# Patient Record
Sex: Female | Born: 1940
Health system: Southern US, Community
[De-identification: ages and names within clinical notes are randomized; demographics above are authoritative.]

## PROBLEM LIST (undated history)

## (undated) DIAGNOSIS — E785 Hyperlipidemia, unspecified: Secondary | ICD-10-CM

## (undated) DIAGNOSIS — I071 Rheumatic tricuspid insufficiency: Secondary | ICD-10-CM

## (undated) DIAGNOSIS — N183 Chronic kidney disease, stage 3 (moderate): Secondary | ICD-10-CM

## (undated) DIAGNOSIS — J449 Chronic obstructive pulmonary disease, unspecified: Secondary | ICD-10-CM

## (undated) DIAGNOSIS — I10 Essential (primary) hypertension: Secondary | ICD-10-CM

## (undated) DIAGNOSIS — I2510 Atherosclerotic heart disease of native coronary artery without angina pectoris: Secondary | ICD-10-CM

## (undated) DIAGNOSIS — I4891 Unspecified atrial fibrillation: Secondary | ICD-10-CM

## (undated) DIAGNOSIS — J189 Pneumonia, unspecified organism: Secondary | ICD-10-CM

## (undated) DIAGNOSIS — E119 Type 2 diabetes mellitus without complications: Secondary | ICD-10-CM

## (undated) DIAGNOSIS — M1990 Unspecified osteoarthritis, unspecified site: Secondary | ICD-10-CM

## (undated) DIAGNOSIS — K56609 Unspecified intestinal obstruction, unspecified as to partial versus complete obstruction: Secondary | ICD-10-CM

## (undated) DIAGNOSIS — I5030 Unspecified diastolic (congestive) heart failure: Secondary | ICD-10-CM

## (undated) DIAGNOSIS — E559 Vitamin D deficiency, unspecified: Secondary | ICD-10-CM

## (undated) HISTORY — DX: Unspecified atrial fibrillation: I48.91

## (undated) HISTORY — DX: Rheumatic tricuspid insufficiency: I07.1

## (undated) HISTORY — DX: Vitamin D deficiency, unspecified: E55.9

## (undated) HISTORY — DX: Hyperlipidemia, unspecified: E78.5

## (undated) HISTORY — PX: TUBAL LIGATION: SHX77

## (undated) HISTORY — DX: Chronic kidney disease, stage 3 (moderate): N18.3

## (undated) HISTORY — DX: Atherosclerotic heart disease of native coronary artery without angina pectoris: I25.10

## (undated) HISTORY — DX: Essential (primary) hypertension: I10

## (undated) HISTORY — PX: HEART CHAMBER REVISION: SHX267

## (undated) HISTORY — PX: APPENDECTOMY: SHX54

## (undated) HISTORY — DX: Unspecified diastolic (congestive) heart failure: I50.30

---

## 1999-12-19 ENCOUNTER — Emergency Department (HOSPITAL_COMMUNITY): Admission: EM | Admit: 1999-12-19 | Discharge: 1999-12-19 | Payer: Self-pay | Admitting: Emergency Medicine

## 1999-12-20 ENCOUNTER — Encounter: Payer: Self-pay | Admitting: Emergency Medicine

## 2000-12-19 ENCOUNTER — Emergency Department (HOSPITAL_COMMUNITY): Admission: EM | Admit: 2000-12-19 | Discharge: 2000-12-19 | Payer: Self-pay | Admitting: Emergency Medicine

## 2000-12-19 ENCOUNTER — Encounter: Payer: Self-pay | Admitting: Emergency Medicine

## 2004-11-13 ENCOUNTER — Emergency Department (HOSPITAL_COMMUNITY): Admission: EM | Admit: 2004-11-13 | Discharge: 2004-11-13 | Payer: Self-pay | Admitting: *Deleted

## 2006-03-22 ENCOUNTER — Ambulatory Visit (HOSPITAL_COMMUNITY): Admission: RE | Admit: 2006-03-22 | Discharge: 2006-03-22 | Payer: Self-pay | Admitting: Cardiology

## 2006-07-10 LAB — HM PAP SMEAR: HM Pap smear: NORMAL

## 2008-07-15 LAB — HM DEXA SCAN

## 2008-08-13 LAB — HM COLONOSCOPY: HM Colonoscopy: NORMAL

## 2008-12-25 ENCOUNTER — Inpatient Hospital Stay (HOSPITAL_COMMUNITY): Admission: EM | Admit: 2008-12-25 | Discharge: 2008-12-26 | Payer: Self-pay | Admitting: Emergency Medicine

## 2009-07-16 LAB — HM MAMMOGRAPHY: HM Mammogram: NORMAL

## 2010-02-13 ENCOUNTER — Encounter: Payer: Self-pay | Admitting: Family Medicine

## 2010-04-26 LAB — CULTURE, BLOOD (ROUTINE X 2): Culture: NO GROWTH

## 2010-04-26 LAB — CBC
HCT: 32.2 % — ABNORMAL LOW (ref 36.0–46.0)
HCT: 34.3 % — ABNORMAL LOW (ref 36.0–46.0)
Hemoglobin: 11.2 g/dL — ABNORMAL LOW (ref 12.0–15.0)
Hemoglobin: 11.8 g/dL — ABNORMAL LOW (ref 12.0–15.0)
MCHC: 34.5 g/dL (ref 30.0–36.0)
MCV: 89.4 fL (ref 78.0–100.0)
Platelets: 139 10*3/uL — ABNORMAL LOW (ref 150–400)
RBC: 3.63 MIL/uL — ABNORMAL LOW (ref 3.87–5.11)
RBC: 3.83 MIL/uL — ABNORMAL LOW (ref 3.87–5.11)
RDW: 14.3 % (ref 11.5–15.5)
RDW: 14.6 % (ref 11.5–15.5)
WBC: 6.8 10*3/uL (ref 4.0–10.5)
WBC: 8.3 10*3/uL (ref 4.0–10.5)

## 2010-04-26 LAB — BASIC METABOLIC PANEL
Calcium: 8.6 mg/dL (ref 8.4–10.5)
GFR calc Af Amer: >60 mL/min (ref >60)
GFR calc non Af Amer: >60 mL/min (ref >60)
Glucose, Bld: 175 mg/dL — ABNORMAL HIGH (ref 70–99)
Potassium: 3.5 mEq/L (ref 3.5–5.1)
Sodium: 138 mEq/L (ref 135–145)

## 2010-04-26 LAB — GLUCOSE, CAPILLARY
Glucose-Capillary: 104 mg/dL — ABNORMAL HIGH (ref 70–99)
Glucose-Capillary: 140 mg/dL — ABNORMAL HIGH (ref 70–99)
Glucose-Capillary: 149 mg/dL — ABNORMAL HIGH (ref 70–99)

## 2010-04-26 LAB — DIFFERENTIAL
Basophils Absolute: 0 10*3/uL (ref 0.0–0.1)
Basophils Relative: 0 % (ref 0–1)
Eosinophils Absolute: 0 10*3/uL (ref 0.0–0.7)
Eosinophils Relative: 1 % (ref 0–5)
Eosinophils Relative: 2 % (ref 0–5)
Lymphocytes Relative: 17 % (ref 12–46)
Lymphs Abs: 1.2 10*3/uL (ref 0.7–4.0)
Lymphs Abs: 1.3 10*3/uL (ref 0.7–4.0)
Monocytes Absolute: 0.7 10*3/uL (ref 0.1–1.0)
Monocytes Relative: 11 % (ref 3–12)
Monocytes Relative: 9 % (ref 3–12)
Neutro Abs: 4.7 10*3/uL (ref 1.7–7.7)

## 2010-04-26 LAB — URINE MICROSCOPIC-ADD ON

## 2010-04-26 LAB — POCT I-STAT, CHEM 8
Calcium, Ion: 1.11 mmol/L — ABNORMAL LOW (ref 1.12–1.32)
Creatinine, Ser: 0.7 mg/dL (ref 0.4–1.2)
Glucose, Bld: 107 mg/dL — ABNORMAL HIGH (ref 70–99)
Hemoglobin: 11.9 g/dL — ABNORMAL LOW (ref 12.0–15.0)
TCO2: 22 mmol/L (ref 0–100)

## 2010-04-26 LAB — URINALYSIS, ROUTINE W REFLEX MICROSCOPIC
Bilirubin Urine: NEGATIVE
Ketones, ur: NEGATIVE mg/dL
Protein, ur: NEGATIVE mg/dL
Urobilinogen, UA: 0.2 mg/dL (ref 0.0–1.0)

## 2010-04-26 LAB — URINE CULTURE: Colony Count: NO GROWTH

## 2010-04-26 LAB — LIPID PANEL: LDL Cholesterol: 48 mg/dL (ref 0–99)

## 2010-05-11 ENCOUNTER — Encounter: Payer: Self-pay | Admitting: Physician Assistant

## 2010-07-26 ENCOUNTER — Encounter: Payer: Self-pay | Admitting: Family Medicine

## 2011-02-02 ENCOUNTER — Emergency Department (HOSPITAL_COMMUNITY): Payer: Medicare HMO

## 2011-02-02 ENCOUNTER — Inpatient Hospital Stay (HOSPITAL_COMMUNITY)
Admission: EM | Admit: 2011-02-02 | Discharge: 2011-02-06 | DRG: 292 | Disposition: A | Discharge status: Home / Self Care | Payer: Medicare HMO | Attending: Internal Medicine | Admitting: Internal Medicine

## 2011-02-02 ENCOUNTER — Encounter (HOSPITAL_COMMUNITY): Payer: Self-pay | Admitting: Emergency Medicine

## 2011-02-02 DIAGNOSIS — I272 Pulmonary hypertension, unspecified: Secondary | ICD-10-CM | POA: Diagnosis present

## 2011-02-02 DIAGNOSIS — J441 Chronic obstructive pulmonary disease with (acute) exacerbation: Secondary | ICD-10-CM | POA: Diagnosis present

## 2011-02-02 DIAGNOSIS — I160 Hypertensive urgency: Secondary | ICD-10-CM | POA: Diagnosis present

## 2011-02-02 DIAGNOSIS — R739 Hyperglycemia, unspecified: Secondary | ICD-10-CM

## 2011-02-02 DIAGNOSIS — E876 Hypokalemia: Secondary | ICD-10-CM

## 2011-02-02 DIAGNOSIS — I5031 Acute diastolic (congestive) heart failure: Principal | ICD-10-CM

## 2011-02-02 DIAGNOSIS — D649 Anemia, unspecified: Secondary | ICD-10-CM | POA: Diagnosis present

## 2011-02-02 DIAGNOSIS — IMO0002 Reserved for concepts with insufficient information to code with codable children: Secondary | ICD-10-CM

## 2011-02-02 DIAGNOSIS — J449 Chronic obstructive pulmonary disease, unspecified: Secondary | ICD-10-CM | POA: Diagnosis present

## 2011-02-02 DIAGNOSIS — I509 Heart failure, unspecified: Secondary | ICD-10-CM

## 2011-02-02 DIAGNOSIS — I10 Essential (primary) hypertension: Secondary | ICD-10-CM

## 2011-02-02 DIAGNOSIS — E1165 Type 2 diabetes mellitus with hyperglycemia: Secondary | ICD-10-CM

## 2011-02-02 DIAGNOSIS — J4489 Other specified chronic obstructive pulmonary disease: Secondary | ICD-10-CM | POA: Diagnosis present

## 2011-02-02 DIAGNOSIS — E1121 Type 2 diabetes mellitus with diabetic nephropathy: Secondary | ICD-10-CM | POA: Diagnosis present

## 2011-02-02 DIAGNOSIS — E785 Hyperlipidemia, unspecified: Secondary | ICD-10-CM

## 2011-02-02 DIAGNOSIS — J189 Pneumonia, unspecified organism: Secondary | ICD-10-CM

## 2011-02-02 DIAGNOSIS — I2789 Other specified pulmonary heart diseases: Secondary | ICD-10-CM | POA: Diagnosis present

## 2011-02-02 DIAGNOSIS — IMO0001 Reserved for inherently not codable concepts without codable children: Secondary | ICD-10-CM | POA: Diagnosis present

## 2011-02-02 LAB — BASIC METABOLIC PANEL
CO2: 30 mEq/L (ref 19–32)
Calcium: 9.5 mg/dL (ref 8.4–10.5)
Creatinine, Ser: 0.82 mg/dL (ref 0.50–1.10)
Glucose, Bld: 413 mg/dL — ABNORMAL HIGH (ref 70–99)

## 2011-02-02 LAB — CULTURE, BLOOD (ROUTINE X 2)

## 2011-02-02 LAB — RETICULOCYTES
RBC.: 3.64 MIL/uL — ABNORMAL LOW (ref 3.87–5.11)
Retic Ct Pct: 2 % (ref 0.4–3.1)

## 2011-02-02 LAB — PRO B NATRIURETIC PEPTIDE: Pro B Natriuretic peptide (BNP): 5352 pg/mL — ABNORMAL HIGH (ref 0–125)

## 2011-02-02 LAB — DIFFERENTIAL
Basophils Absolute: 0.1 10*3/uL (ref 0.0–0.1)
Eosinophils Relative: 3 % (ref 0–5)
Lymphocytes Relative: 22 % (ref 12–46)
Monocytes Absolute: 0.4 10*3/uL (ref 0.1–1.0)

## 2011-02-02 LAB — CBC
HCT: 31.4 % — ABNORMAL LOW (ref 36.0–46.0)
MCH: 28.5 pg (ref 26.0–34.0)
MCHC: 33.4 g/dL (ref 30.0–36.0)
MCV: 85.1 fL (ref 78.0–100.0)
RDW: 13.9 % (ref 11.5–15.5)
WBC: 5.8 10*3/uL (ref 4.0–10.5)

## 2011-02-02 MED ORDER — SIMVASTATIN 20 MG PO TABS
40.0000 mg | ORAL_TABLET | Freq: Every day | ORAL | Status: DC
Start: 2011-02-03 — End: 2011-02-03

## 2011-02-02 MED ORDER — SENNOSIDES-DOCUSATE SODIUM 8.6-50 MG PO TABS
2.0000 | ORAL_TABLET | Freq: Every day | ORAL | Status: DC
  Administered 2011-02-03 – 2011-02-05 (×3): 2 via ORAL
  Filled 2011-02-02 (×3): qty 2

## 2011-02-02 MED ORDER — POTASSIUM CHLORIDE CRYS ER 20 MEQ PO TBCR
40.0000 meq | EXTENDED_RELEASE_TABLET | ORAL | Status: AC
  Administered 2011-02-03: 40 meq via ORAL
  Filled 2011-02-02 (×2): qty 2

## 2011-02-02 MED ORDER — IPRATROPIUM BROMIDE 0.02 % IN SOLN: 0.5000 mg | RESPIRATORY_TRACT | Status: DC | PRN

## 2011-02-02 MED ORDER — ALBUTEROL SULFATE (5 MG/ML) 0.5% IN NEBU
5.0000 mg | INHALATION_SOLUTION | Freq: Once | RESPIRATORY_TRACT | Status: AC
  Administered 2011-02-02: 5 mg via RESPIRATORY_TRACT
  Filled 2011-02-02: qty 1

## 2011-02-02 MED ORDER — ONDANSETRON HCL 4 MG/2ML IJ SOLN: 4.0000 mg | INTRAMUSCULAR | Status: DC | PRN

## 2011-02-02 MED ORDER — ACETAMINOPHEN 325 MG PO TABS: 650.0000 mg | ORAL_TABLET | ORAL | Status: DC | PRN

## 2011-02-02 MED ORDER — INSULIN ASPART 100 UNIT/ML ~~LOC~~ SOLN
0.0000 [IU] | Freq: Three times a day (TID) | SUBCUTANEOUS | Status: DC
  Administered 2011-02-03: 15 [IU] via SUBCUTANEOUS
  Administered 2011-02-03: 5 [IU] via SUBCUTANEOUS
  Administered 2011-02-04: 15 [IU] via SUBCUTANEOUS
  Administered 2011-02-04: 8 [IU] via SUBCUTANEOUS
  Administered 2011-02-05: 11 [IU] via SUBCUTANEOUS
  Administered 2011-02-05 (×2): 8 [IU] via SUBCUTANEOUS
  Administered 2011-02-06: 3 [IU] via SUBCUTANEOUS
  Administered 2011-02-06: 11 [IU] via SUBCUTANEOUS
  Filled 2011-02-02: qty 3

## 2011-02-02 MED ORDER — DEXTROSE 5 % IV SOLN
500.0000 mg | Freq: Once | INTRAVENOUS | Status: AC
  Administered 2011-02-02: 500 mg via INTRAVENOUS
  Filled 2011-02-02: qty 500

## 2011-02-02 MED ORDER — POTASSIUM CHLORIDE CRYS ER 20 MEQ PO TBCR
40.0000 meq | EXTENDED_RELEASE_TABLET | Freq: Once | ORAL | Status: AC
  Administered 2011-02-02: 40 meq via ORAL
  Filled 2011-02-02: qty 2

## 2011-02-02 MED ORDER — INFLUENZA VIRUS VACC SPLIT PF IM SUSP
0.5000 mL | INTRAMUSCULAR | Status: AC
  Filled 2011-02-02: qty 0.5

## 2011-02-02 MED ORDER — DEXTROSE 5 % IV SOLN
1.0000 g | Freq: Once | INTRAVENOUS | Status: AC
  Administered 2011-02-02: 1 g via INTRAVENOUS
  Filled 2011-02-02: qty 10

## 2011-02-02 MED ORDER — POTASSIUM CHLORIDE 10 MEQ/100ML IV SOLN
10.0000 meq | INTRAVENOUS | Status: AC
  Administered 2011-02-02 – 2011-02-03 (×4): 10 meq via INTRAVENOUS
  Filled 2011-02-02 (×4): qty 100

## 2011-02-02 MED ORDER — INSULIN ASPART 100 UNIT/ML ~~LOC~~ SOLN
0.0000 [IU] | Freq: Every day | SUBCUTANEOUS | Status: DC
  Administered 2011-02-03: 2 [IU] via SUBCUTANEOUS
  Administered 2011-02-04 – 2011-02-05 (×2): 4 [IU] via SUBCUTANEOUS

## 2011-02-02 MED ORDER — POTASSIUM CHLORIDE CRYS ER 20 MEQ PO TBCR
40.0000 meq | EXTENDED_RELEASE_TABLET | Freq: Two times a day (BID) | ORAL | Status: DC
  Administered 2011-02-03 – 2011-02-04 (×3): 40 meq via ORAL
  Filled 2011-02-02: qty 1
  Filled 2011-02-02: qty 2
  Filled 2011-02-02: qty 1
  Filled 2011-02-02: qty 2

## 2011-02-02 MED ORDER — FUROSEMIDE 10 MG/ML IJ SOLN: 40.0000 mg | Freq: Once | INTRAMUSCULAR | Status: DC

## 2011-02-02 MED ORDER — NITROGLYCERIN 2 % TD OINT
1.0000 [in_us] | TOPICAL_OINTMENT | Freq: Four times a day (QID) | TRANSDERMAL | Status: DC
  Administered 2011-02-03 – 2011-02-04 (×6): 1 [in_us] via TOPICAL
  Filled 2011-02-02 (×6): qty 1

## 2011-02-02 MED ORDER — HYDRALAZINE HCL 20 MG/ML IJ SOLN: 10.0000 mg | INTRAMUSCULAR | Status: DC | PRN

## 2011-02-02 MED ORDER — BISACODYL 10 MG RE SUPP: 10.0000 mg | Freq: Every day | RECTAL | Status: DC | PRN

## 2011-02-02 MED ORDER — FUROSEMIDE 10 MG/ML IJ SOLN
40.0000 mg | Freq: Two times a day (BID) | INTRAMUSCULAR | Status: DC
  Administered 2011-02-03 (×3): 40 mg via INTRAVENOUS
  Filled 2011-02-02 (×3): qty 4

## 2011-02-02 MED ORDER — FLEET ENEMA 7-19 GM/118ML RE ENEM: 1.0000 | ENEMA | Freq: Every day | RECTAL | Status: DC | PRN

## 2011-02-02 MED ORDER — ALBUTEROL SULFATE (5 MG/ML) 0.5% IN NEBU: 2.5000 mg | INHALATION_SOLUTION | RESPIRATORY_TRACT | Status: DC | PRN

## 2011-02-02 MED ORDER — IOHEXOL 300 MG/ML  SOLN
80.0000 mL | Freq: Once | INTRAMUSCULAR | Status: AC | PRN
  Administered 2011-02-02: 80 mL via INTRAVENOUS

## 2011-02-02 MED ORDER — ZOLPIDEM TARTRATE 5 MG PO TABS: 5.0000 mg | ORAL_TABLET | Freq: Every evening | ORAL | Status: DC | PRN

## 2011-02-02 MED ORDER — GLIPIZIDE ER 10 MG PO TB24
10.0000 mg | ORAL_TABLET | Freq: Every day | ORAL | Status: DC
  Administered 2011-02-03: 10 mg via ORAL
  Filled 2011-02-02: qty 1

## 2011-02-02 MED ORDER — BENAZEPRIL HCL 10 MG PO TABS
40.0000 mg | ORAL_TABLET | Freq: Every day | ORAL | Status: DC
  Administered 2011-02-03 – 2011-02-06 (×4): 40 mg via ORAL
  Filled 2011-02-02 (×4): qty 4

## 2011-02-02 MED ORDER — POTASSIUM CHLORIDE CRYS ER 20 MEQ PO TBCR
40.0000 meq | EXTENDED_RELEASE_TABLET | Freq: Two times a day (BID) | ORAL | Status: DC
  Administered 2011-02-03: 40 meq via ORAL

## 2011-02-02 MED ORDER — ENOXAPARIN SODIUM 40 MG/0.4ML ~~LOC~~ SOLN
40.0000 mg | Freq: Every day | SUBCUTANEOUS | Status: DC
  Administered 2011-02-03 – 2011-02-06 (×5): 40 mg via SUBCUTANEOUS
  Filled 2011-02-02 (×5): qty 0.4

## 2011-02-02 MED ORDER — ENOXAPARIN SODIUM 40 MG/0.4ML ~~LOC~~ SOLN: 40.0000 mg | SUBCUTANEOUS | Status: DC

## 2011-02-02 MED ORDER — POTASSIUM CHLORIDE 10 MEQ/100ML IV SOLN: 10.0000 meq | INTRAVENOUS | Status: DC

## 2011-02-02 NOTE — ED Notes (Signed)
Hospitalist at the bedside to assess patient.

## 2011-02-02 NOTE — ED Notes (Signed)
Blood cultures drawn initially prior to Antibiotics being initiated.

## 2011-02-02 NOTE — ED Notes (Signed)
Pt c/o SOB over the last several days with minimal cough. Pt states that her SOB became progressively worse on today. Pt reports that her SOB is worse on exertion and when she is walking. Pt with expiratory wheezing heard on auscultation. Pt also c/o some back pain in her back. Pt alert and oriented at this time.

## 2011-02-02 NOTE — ED Notes (Signed)
Patient transported to CT 

## 2011-02-02 NOTE — ED Notes (Signed)
Patient c/o shortness of breath for several days with minimal cough.  States has sharp pain in her back.

## 2011-02-02 NOTE — H&P (Signed)
PCP:  [facility]   Chief Complaint:  Progressive shortness of breath over the past 3-4 days  HPI: Laura Best is an 71 y.o. Caucasian female.   History of Chronic tobacco abuse, COPD, remote history of open heart surgery to repair a hole in the heart, questionable history of CHF, diabetes type 2. Chronic history of episodic leg edema.  Since Monday she's been having progressive shortness of breath has now become so severe that she eventually came to the emergency room to seek treatment here; she has to sit upright to catch her breath; she feels she may have been having a light fever, only scant white sputum, she denies any chest pain. He denies any exposure to sick contacts;  She has not had her flu immunization this season.  He is also noted progressive swelling of her abdomen for the past week; and the swelling of her legs which usually go down and the evening remained persistently swollen.  Chest x-ray showed suggested pneumonia with effusion, and the hospitalist was called; CT scan of the chest was suggested.  Rewiew of Systems:  The patient denies anorexia, fever, weight loss,, vision loss, decreased hearing, hoarseness, chest pain, syncope,  peripheral edema, balance deficits, hemoptysis, abdominal pain, melena, hematochezia, severe indigestion/heartburn, hematuria, incontinence, genital sores, muscle weakness, suspicious skin lesions, transient blindness, difficulty walking, depression, unusual weight change, abnormal bleeding, enlarged lymph nodes, angioedema, and breast masses.   Past Medical History  Diagnosis Date  . Diabetes mellitus   . Hypertension   . Hyperlipidemia   . Tricuspid regurgitation     Echo 05/02/06-Nml LV. Mild MR. Mod TR  . Osteoporosis     Past Surgical History  Procedure Date  . Heart chamber revision     patched hole --1993    Medications:  HOME MEDS: Prior to Admission medications   Medication Sig Start Date End Date Taking?  Authorizing Provider  alendronate (FOSAMAX) 70 MG tablet Take 70 mg by mouth every 7 (seven) days. Take with a full glass of water on an empty stomach.     Historical Provider, MD  benazepril (LOTENSIN) 40 MG tablet Take 40 mg by mouth daily.      Historical Provider, MD  calcium citrate-vitamin D (CITRACAL+D) 315-200 MG-UNIT per tablet Take 1 tablet by mouth 2 (two) times daily.      Historical Provider, MD  Cholecalciferol (VITAMIN D) 2000 UNITS CAPS Take 1 capsule by mouth 1 dose over 46 hours.      Historical Provider, MD  glipiZIDE (GLUCOTROL) 10 MG 24 hr tablet Take 10 mg by mouth daily.      Historical Provider, MD  simvastatin (ZOCOR) 40 MG tablet Take 40 mg by mouth at bedtime.      Historical Provider, MD     Allergies:  Allergies  Allergen Reactions  . Amlodipine   . Aspirin   . Metformin And Related Diarrhea    Social History:   reports that she has quit smoking. She does not have any smokeless tobacco history on file. She reports that she does not drink alcohol or use illicit drugs.  Family History: Family History  Problem Relation Age of Onset  . Cancer Father   . Diabetes Father   . Cancer Brother     Brain  . Cancer Sister     abdominal fat     Physical Exam: Filed Vitals:   02/02/11 2044 02/02/11 2100 02/02/11 2150 02/02/11 2241  BP:  204/79 207/88 197/104  Pulse:  76 85   Temp:      TempSrc:      Resp:  25    Height:      Weight:      SpO2: 96% 96% 94% 97%   Blood pressure 197/104, pulse 85, temperature 98.6 F (37 C), temperature source Oral, resp. rate 25, height 5\' 7"  (1.702 m), weight 65.772 kg (145 lb), SpO2 97.00%.  GEN:  Distress looking elderly lady sitting upright in the stretcher breathing heavily ; cooperative with exam PSYCH:  alert and oriented x4; does not appear anxious does not appear depressed; affect is normal HEENT: Mucous membranes pink and anicteric; PERRLA; EOM intact; no cervical lymphadenopathy nor thyromegaly or carotid  bruit; no  Breasts:: Not examined CHEST WALL: No tenderness CHEST: Tachypneic bilateral crackles bases HEART: Regular rate and rhythm; no murmurs rubs or gallops BACK: Moderate kyphosis; no CVA tenderness ABDOMEN: Obese, distended, flank dullness,  non-tender; no masses, no organomegaly, normal abdominal bowel sounds; no pannus; no intertriginous candida. Rectal Exam: Not done EXTREMITIES:  age-appropriate arthropathy of the hands and knees; 1+ edema bilaterally; no ulcerations. Genitalia: not examined PULSES: 2+ and symmetric SKIN: Normal hydration no rash or ulceration CNS: Cranial nerves 2-12 grossly intact no focal neurologic deficit   Labs & Imaging Results for orders placed during the hospital encounter of 02/02/11 (from the past 48 hour(s))  CBC     Status: Abnormal   Collection Time   02/02/11  7:52 PM      Component Value Range Comment   WBC 5.8  4.0 - 10.5 (K/uL)    RBC 3.69 (*) 3.87 - 5.11 (MIL/uL)    Hemoglobin 10.5 (*) 12.0 - 15.0 (g/dL)    HCT 31.4 (*) 36.0 - 46.0 (%)    MCV 85.1  78.0 - 100.0 (fL)    MCH 28.5  26.0 - 34.0 (pg)    MCHC 33.4  30.0 - 36.0 (g/dL)    RDW 13.9  11.5 - 15.5 (%)    Platelets 242  150 - 400 (K/uL)   DIFFERENTIAL     Status: Normal   Collection Time   02/02/11  7:52 PM      Component Value Range Comment   Neutrophils Relative 67  43 - 77 (%)    Neutro Abs 3.9  1.7 - 7.7 (K/uL)    Lymphocytes Relative 22  12 - 46 (%)    Lymphs Abs 1.3  0.7 - 4.0 (K/uL)    Monocytes Relative 7  3 - 12 (%)    Monocytes Absolute 0.4  0.1 - 1.0 (K/uL)    Eosinophils Relative 3  0 - 5 (%)    Eosinophils Absolute 0.2  0.0 - 0.7 (K/uL)    Basophils Relative 1  0 - 1 (%)    Basophils Absolute 0.1  0.0 - 0.1 (K/uL)   BASIC METABOLIC PANEL     Status: Abnormal   Collection Time   02/02/11  7:52 PM      Component Value Range Comment   Sodium 138  135 - 145 (mEq/L)    Potassium 2.8 (*) 3.5 - 5.1 (mEq/L)    Chloride 100  96 - 112 (mEq/L)    CO2 30  19 - 32  (mEq/L)    Glucose, Bld 413 (*) 70 - 99 (mg/dL)    BUN 9  6 - 23 (mg/dL)    Creatinine, Ser 0.82  0.50 - 1.10 (mg/dL)    Calcium 9.5  8.4 - 10.5 (  mg/dL)    GFR calc non Af Amer 71 (*) >90 (mL/min)    GFR calc Af Amer 82 (*) >90 (mL/min)   PRO B NATRIURETIC PEPTIDE     Status: Abnormal   Collection Time   02/02/11  7:53 PM      Component Value Range Comment   Pro B Natriuretic peptide (BNP) 5352.0 (*) 0 - 125 (pg/mL)   POCT I-STAT TROPONIN I     Status: Normal   Collection Time   02/02/11  8:12 PM      Component Value Range Comment   Troponin i, poc 0.04  0.00 - 0.08 (ng/mL)    Comment 3             Ct Chest W Contrast  02/02/2011  *RADIOLOGY REPORT*  Clinical Data: Short of breath and leg swelling.  Fever.  CT CHEST WITH CONTRAST  Technique:  Multidetector CT imaging of the chest was performed following the standard protocol during bolus administration of intravenous contrast.  Contrast: 64mL OMNIPAQUE IOHEXOL 300 MG/ML IV SOLN  Comparison: 02/02/2011.  Findings: Cardiomegaly.  Pulmonary arterial hypertension.  Mild mediastinal adenopathy is present which is probably congestive. Bilateral pleural effusions are present, small and layering dependently.  Fluid tracks in the right major fissure.  Scattered ground-glass attenuation is present in the lungs, most compatible with alveolar edema.  Interlobular septal thickening is present indicating interstitial pulmonary edema.  High attenuation is present in the right atrium, probably representing old calcified mural thrombus.  Incidental imaging the upper abdomen demonstrates right upper pole renal scarring.  There is no pericardial effusion.  Median sternotomy is present. Coronary artery atherosclerosis is present. If office based assessment of coronary risk factors has not been performed, it is now recommended.  Thoracic spondylosis is present.  No aggressive osseous lesions are present.  There is no focal airspace consolidation.  Basilar atelectasis  is present associated with effusions.  There is a nonspecific low density lesion within the left thyroid lobe measuring 17 mm.  IMPRESSION: 1.  Moderate CHF with small to moderate bilateral pleural effusions which appear predominately free-flowing. 2.  Cardiomegaly and pulmonary arterial hypertension. 3.  Atherosclerosis and coronary artery disease. 4.  Left thyroid lobe low density lesion.  Follow-up thyroid ultrasound recommended. 5.  Right upper pole renal scarring. 6.  Mild mediastinal adenopathy is probably congestive.  Original Report Authenticated By: Dereck Ligas, M.D.   Dg Chest Port 1 View  02/02/2011  *RADIOLOGY REPORT*  Clinical Data: Chest pain and shortness of breath.  PORTABLE CHEST - 1 VIEW  Comparison: Chest x-ray 12/24/2008.  Findings: The heart is mildly enlarged.  There is tortuosity and calcification of the thoracic aorta.  There are chronic underlying bronchitic type lung changes with superimposed right lower lobe infiltrate and effusion.  The left lung base demonstrate scarring changes and atelectasis.  IMPRESSION: Right lower lobe pneumonia and associated parapneumonic effusion.  Original Report Authenticated By: P. Kalman Jewels, M.D.      Assessment//PLAN Present on Admission:  .Hypertension -malignant - control blood pressure with nitrates and hydralazine; resume ACE inhibitor ; .Acute CHF (congestive heart failure) - vigorous diuresis; nitrates;  ACEi', hold beta blockers for now; 2-D echo  .Hypertensive urgency  .Diabetes type 2, uncontrolled; resume glipizide; discontinue Actos cause of CHF; sliding scale insulin. Marland KitchenHyperlipidemia .Pulmonary hypertension -= cardiac evaluation  .COPD (chronic obstructive pulmonary disease) - serial nebulizations  .Hypokalemia =- complete intravenous and oral  .Normocytic anemia - anemia evaluation  Critical care time: 60 minutes.  Laura Best 02/02/2011, 11:10 PM

## 2011-02-02 NOTE — ED Provider Notes (Signed)
History  Scribed for Laura Cable, MD, the patient was seen in room APA11/APA11. This chart was scribed by Truddie Coco. The patient's care started at 7:36 PM    CSN: [id number]  Arrival date & time 02/02/11  [id number]   First MD Initiated Contact with Patient 02/02/11 1933      Chief Complaint  Patient presents with  . Shortness of Breath     The history is provided by the patient.   Laura Best is a 71 y.o. female who presents to the Emergency Department complaining of SOB that began about a week ago and has gotten worse today.  She is also experiencing a fever (no recorded temp), chest pain, leg swelling, and abdominal pain.  She states that she noticed the leg swelling about three weeks ago.  She denies cough.  Pt goes to [facility].  She has h/o HTN and diabetes.   Nothing improves her symptoms Nothing worsens her symptoms Her course is worsening  Past Medical History  Diagnosis Date  . Diabetes mellitus   . Hypertension   . Hyperlipidemia   . Tricuspid regurgitation     Echo 05/02/06-Nml LV. Mild MR. Mod TR  . Osteoporosis     Past Surgical History  Procedure Date  . Heart chamber revision     patched hole --1993    No family history on file.  History  Substance Use Topics  . Smoking status: Former Research scientist (life sciences)  . Smokeless tobacco: Not on file  . Alcohol Use: No    OB History    Grav Para Term Preterm Abortions TAB SAB Ect Mult Living                  Review of Systems  Constitutional: Positive for fever.  Respiratory: Positive for shortness of breath. Negative for cough.   Cardiovascular: Positive for chest pain and leg swelling.  All other systems reviewed and are negative.    Allergies  Amlodipine; Aspirin; and Metformin and related  Home Medications   Current Outpatient Rx  Name Route Sig Dispense Refill  . ALENDRONATE SODIUM 70 MG PO TABS Oral Take 70 mg by mouth every 7 (seven) days. Take with a full glass of water on an  empty stomach.     Marland Kitchen BENAZEPRIL HCL 40 MG PO TABS Oral Take 40 mg by mouth daily.      Marland Kitchen CALCIUM CITRATE-VITAMIN D 315-200 MG-UNIT PO TABS Oral Take 1 tablet by mouth 2 (two) times daily.      Marland Kitchen VITAMIN D 2000 UNITS PO CAPS Oral Take 1 capsule by mouth 1 dose over 46 hours.      Marland Kitchen GLIPIZIDE ER 10 MG PO TB24 Oral Take 10 mg by mouth daily.      Marland Kitchen PIOGLITAZONE HCL 45 MG PO TABS Oral Take 45 mg by mouth daily.      Marland Kitchen SIMVASTATIN 40 MG PO TABS Oral Take 40 mg by mouth at bedtime.        BP 232/106  Pulse 91  Temp(Src) 98 F (36.7 C) (Oral)  Resp 20  Ht 5\' 7"  (1.702 m)  Wt 145 lb (65.772 kg)  BMI 22.71 kg/m2  SpO2 94% BP 204/79  Pulse 76  Temp(Src) 98.3 F (36.8 C) (Oral)  Resp 25  Ht 5\' 7"  (1.702 m)  Wt 145 lb (65.772 kg)  BMI 22.71 kg/m2  SpO2 96%   Physical Exam CONSTITUTIONAL: Well developed/well nourished HEAD AND FACE: Normocephalic/atraumatic EYES: EOMI/PERRL  ENMT: Mucous membranes moist NECK: supple no meningeal signs SPINE:entire spine nontender CV: S1/S2 noted, no murmurs/rubs/gallops noted LUNGS:  tachypnea and rales at each base.  ABDOMEN: soft, nontender, no rebound or guarding GU:no cva tenderness NEURO: Pt is awake/alert, moves all extremitiesx4 EXTREMITIES: pulses normal, full ROM, edema of LE  SKIN: warm, color normal PSYCH: no abnormalities of mood noted  ED Course  Procedures  DIAGNOSTIC STUDIES: Oxygen Saturation is 94% on room air, low by my interpretation.    COORDINATION OF CARE:  7:39PM Ordered: Differential ; Basic metabolic panel ; i-Stat troponin I ; Pro b natriuretic peptide (BNP) ; DG Chest Port 1 View ; Oxygen therapy Liters Per Minute: 2; Mode or (Route): Nasal cannula ; ED EKG  Results for orders placed during the hospital encounter of 02/02/11  CBC      Component Value Range   WBC 5.8  4.0 - 10.5 (K/uL)   RBC 3.69 (*) 3.87 - 5.11 (MIL/uL)   Hemoglobin 10.5 (*) 12.0 - 15.0 (g/dL)   HCT 31.4 (*) 36.0 - 46.0 (%)   MCV 85.1  78.0 -  100.0 (fL)   MCH 28.5  26.0 - 34.0 (pg)   MCHC 33.4  30.0 - 36.0 (g/dL)   RDW 13.9  11.5 - 15.5 (%)   Platelets 242  150 - 400 (K/uL)  DIFFERENTIAL      Component Value Range   Neutrophils Relative 67  43 - 77 (%)   Neutro Abs 3.9  1.7 - 7.7 (K/uL)   Lymphocytes Relative 22  12 - 46 (%)   Lymphs Abs 1.3  0.7 - 4.0 (K/uL)   Monocytes Relative 7  3 - 12 (%)   Monocytes Absolute 0.4  0.1 - 1.0 (K/uL)   Eosinophils Relative 3  0 - 5 (%)   Eosinophils Absolute 0.2  0.0 - 0.7 (K/uL)   Basophils Relative 1  0 - 1 (%)   Basophils Absolute 0.1  0.0 - 0.1 (K/uL)  BASIC METABOLIC PANEL      Component Value Range   Sodium 138  135 - 145 (mEq/L)   Potassium 2.8 (*) 3.5 - 5.1 (mEq/L)   Chloride 100  96 - 112 (mEq/L)   CO2 30  19 - 32 (mEq/L)   Glucose, Bld 413 (*) 70 - 99 (mg/dL)   BUN 9  6 - 23 (mg/dL)   Creatinine, Ser 0.82  0.50 - 1.10 (mg/dL)   Calcium 9.5  8.4 - 10.5 (mg/dL)   GFR calc non Af Amer 71 (*) >90 (mL/min)   GFR calc Af Amer 82 (*) >90 (mL/min)  PRO B NATRIURETIC PEPTIDE      Component Value Range   Pro B Natriuretic peptide (BNP) 5352.0 (*) 0 - 125 (pg/mL)  POCT I-STAT TROPONIN I      Component Value Range   Troponin i, poc 0.04  0.00 - 0.08 (ng/mL)   Comment 3            Dg Chest Port 1 View  02/02/2011  *RADIOLOGY REPORT*  Clinical Data: Chest pain and shortness of breath.  PORTABLE CHEST - 1 VIEW  Comparison: Chest x-ray 12/24/2008.  Findings: The heart is mildly enlarged.  There is tortuosity and calcification of the thoracic aorta.  There are chronic underlying bronchitic type lung changes with superimposed right lower lobe infiltrate and effusion.  The left lung base demonstrate scarring changes and atelectasis.  IMPRESSION: Right lower lobe pneumonia and associated parapneumonic effusion.  Original Report Authenticated  By: P. Kalman Jewels, M.D.    9:36 PM Pt stable at his time, resting comfortably Likely CAP given CXR No recent  hospitalizations/antibiotics Will admit D/w dr Megan Salon, requests CT chest/admit  MDM  Nursing notes reviewed and considered in documentation xrays reviewed and considered All labs/vitals reviewed and considered Previous records reviewed and considered    Date: 02/02/2011  Rate: 78  Rhythm: normal sinus rhythm  QRS Axis: left  Intervals: QT prolonged  ST/T Wave abnormalities: nonspecific ST changes  Conduction Disutrbances:nonspecific intraventricular conduction delay  Narrative Interpretation:   Old EKG Reviewed: unchanged      I personally performed the services described in this documentation, which was scribed in my presence. The recorded information has been reviewed and considered.      Laura Cable, MD 02/02/11 2137

## 2011-02-03 ENCOUNTER — Inpatient Hospital Stay (HOSPITAL_COMMUNITY): Payer: Medicare HMO

## 2011-02-03 DIAGNOSIS — I517 Cardiomegaly: Secondary | ICD-10-CM

## 2011-02-03 DIAGNOSIS — I509 Heart failure, unspecified: Secondary | ICD-10-CM

## 2011-02-03 DIAGNOSIS — I10 Essential (primary) hypertension: Secondary | ICD-10-CM

## 2011-02-03 LAB — CBC
MCV: 85.6 fL (ref 78.0–100.0)
Platelets: 230 10*3/uL (ref 150–400)
RDW: 14.1 % (ref 11.5–15.5)
WBC: 6.9 10*3/uL (ref 4.0–10.5)

## 2011-02-03 LAB — GLUCOSE, CAPILLARY: Glucose-Capillary: 111 mg/dL — ABNORMAL HIGH (ref 70–99)

## 2011-02-03 LAB — IRON AND TIBC
Saturation Ratios: 16 % — ABNORMAL LOW (ref 20–55)
UIBC: 178 ug/dL (ref 125–400)

## 2011-02-03 LAB — BASIC METABOLIC PANEL
BUN: 7 mg/dL (ref 6–23)
Calcium: 9.2 mg/dL (ref 8.4–10.5)
Creatinine, Ser: 0.79 mg/dL (ref 0.50–1.10)
GFR calc Af Amer: >90 mL/min (ref >90)
GFR calc non Af Amer: 82 mL/min — ABNORMAL LOW (ref >90)

## 2011-02-03 LAB — HEPATIC FUNCTION PANEL
Alkaline Phosphatase: 95 U/L (ref 39–117)
Total Bilirubin: 0.2 mg/dL — ABNORMAL LOW (ref 0.3–1.2)

## 2011-02-03 LAB — HEMOGLOBIN A1C
Hgb A1c MFr Bld: 13.1 % — ABNORMAL HIGH (ref <5.7)
Mean Plasma Glucose: 329 mg/dL — ABNORMAL HIGH (ref <117)

## 2011-02-03 LAB — CARDIAC PANEL(CRET KIN+CKTOT+MB+TROPI)
CK, MB: 3 ng/mL (ref 0.3–4.0)
Relative Index: INVALID (ref 0.0–2.5)
Relative Index: INVALID (ref 0.0–2.5)
Troponin I: <0.3 ng/mL (ref <0.30)
Troponin I: <0.3 ng/mL (ref <0.30)

## 2011-02-03 LAB — MAGNESIUM: Magnesium: 1.6 mg/dL (ref 1.5–2.5)

## 2011-02-03 MED ORDER — AMLODIPINE BESYLATE 5 MG PO TABS
5.0000 mg | ORAL_TABLET | Freq: Every day | ORAL | Status: DC
  Administered 2011-02-03 – 2011-02-06 (×4): 5 mg via ORAL
  Filled 2011-02-03 (×4): qty 1

## 2011-02-03 MED ORDER — ROSUVASTATIN CALCIUM 20 MG PO TABS
10.0000 mg | ORAL_TABLET | Freq: Every day | ORAL | Status: DC
  Administered 2011-02-03 – 2011-02-05 (×3): 10 mg via ORAL
  Filled 2011-02-03 (×3): qty 1

## 2011-02-03 MED ORDER — GLIPIZIDE ER 10 MG PO TB24
10.0000 mg | ORAL_TABLET | Freq: Two times a day (BID) | ORAL | Status: DC
  Administered 2011-02-03 – 2011-02-05 (×4): 10 mg via ORAL
  Filled 2011-02-03 (×4): qty 1

## 2011-02-03 MED ORDER — SODIUM CHLORIDE 0.9 % IJ SOLN
INTRAMUSCULAR | Status: AC
  Administered 2011-02-03: 10 mL
  Filled 2011-02-03: qty 3

## 2011-02-03 MED ORDER — FUROSEMIDE 10 MG/ML IJ SOLN
40.0000 mg | Freq: Three times a day (TID) | INTRAMUSCULAR | Status: DC
  Administered 2011-02-03 – 2011-02-04 (×2): 40 mg via INTRAVENOUS
  Filled 2011-02-03 (×2): qty 4

## 2011-02-03 NOTE — Plan of Care (Signed)
Problem: Food- and Nutrition-Related Knowledge Deficit (NB-1.1) Goal: Nutrition education Formal process to instruct or train a patient/client in a skill or to impart knowledge to help patients/clients voluntarily manage or modify food choices and eating behavior to maintain or improve health.  Outcome: Adequate for Discharge Pt dx with DM ~2 yrs ago.She was unable to demonstrate basic understanding of diet principles. Reviewed basics of carbohydrate counting with pt and provided handout with examples of appropriate portions of carbohydrate foods and label reading demonstrated and discussed. She reports that her blood glucose usually >200 ml/dl. Rec she be follow-up by outpatient RD for further re-inforcement of diet guidelines.

## 2011-02-03 NOTE — Consult Note (Signed)
CARDIOLOGY CONSULT NOTE  Patient ID: Laura Best MRN: [id number] DOB/AGE: 1940-02-26 71 y.o.  Admit date: 02/02/2011 Referring Physician: PTH Primary PhysicianNo primary provider on file. Primary Cardiologist: Lattie Haw (New) Reason for Consultation: CHF Principal Problem:  *Acute CHF (congestive heart failure) Active Problems:  Hypertension  Hyperlipidemia  Hypertensive urgency  Diabetes type 2, uncontrolled  Pulmonary hypertension  COPD (chronic obstructive pulmonary disease)  Hypokalemia  Normocytic anemia  HPI: Laura Best is a 71 y/o patient of [facility], who was admitted on 02/02/2011 for CHF. She has a history of prior cardiology evaluation and was seen by Dr. Servando Snare, she states she had a "pin hole in her heart that needed repair", this appears to be a VSD from her description., She has been lost to follow-up for many years as she states she has been feeling well and and did not feel a need to see a cardiologist. She has CVRF's for CAD with hypertension, diabetes, hyperlipidemia. Symptoms began approximately one week prior to admission with associated LEE. BNP on admission was elevated at 5352. CXR demonstrated interstitial edema and increased effusions. She has been placed on IV lasix 40 mg BID but urine output has been minimal. We are asked for cardiology recommendations and treatment.Echo has been completed and yet to be read. Currently she is feeling much better and has no further complaints of lower extremity edema. Prior to coming in she was having 4 pillow orthopnea, with chest pressure and wheezing. She has been diuresis with Lasix 40 mg IV twice a day. Weights are not reflective of diuresis. It is noted that she also has some mild anemia. She denies any hemoptysis or melena.  Review of systems complete and found to be negative unless listed above   Past Medical History  Diagnosis Date  . Diabetes mellitus   . Hypertension   . Hyperlipidemia   .  Tricuspid regurgitation     Echo 05/02/06-Nml LV. Mild MR. Mod TR  . Osteoporosis     Family History  Problem Relation Age of Onset  . Cancer Father   . Diabetes Father   . Cancer Brother     Brain  . Cancer Sister     abdominal fat    History   Social History  . Marital Status: Widowed    Spouse Name: N/A    Number of Children: N/A  . Years of Education: N/A   Occupational History  . Not on file.   Social History Main Topics  . Smoking status: Former Research scientist (life sciences)  . Smokeless tobacco: Not on file  . Alcohol Use: No  . Drug Use: No  . Sexually Active:    Other Topics Concern  . Not on file   Social History Narrative  . No narrative on file    Past Surgical History  Procedure Date  . Heart chamber revision     patched hole --1993  . Tubal ligation      Prescriptions prior to admission  Medication Sig Dispense Refill  . alendronate (FOSAMAX) 70 MG tablet Take 70 mg by mouth every 7 (seven) days. Take with a full glass of water on an empty stomach.       . benazepril (LOTENSIN) 40 MG tablet Take 40 mg by mouth daily.        . calcium citrate-vitamin D (CITRACAL+D) 315-200 MG-UNIT per tablet Take 1 tablet by mouth 2 (two) times daily.        . Cholecalciferol (VITAMIN D) 2000 UNITS CAPS  Take 1 capsule by mouth 1 dose over 46 hours.        Marland Kitchen glipiZIDE (GLUCOTROL) 10 MG 24 hr tablet Take 10 mg by mouth daily.        . simvastatin (ZOCOR) 40 MG tablet Take 40 mg by mouth at bedtime.          Physical Exam: Blood pressure 134/66, pulse 78, temperature 98.2 F (36.8 C), temperature source Oral, resp. rate 18, height 5\' 7"  (1.702 m), weight 151 lb 10.8 oz (68.8 kg), SpO2 96.00%.   General: Well developed, well nourished, in no acute distress Head: Eyes PERRLA, No xanthomas.   Normal cephalic and atramatic  Lungs: Inspiratory wheezes are noted anterior chest with diminished lung sounds bibasilar, normal expiratory phase without cough. Heart: HRRR S1 S2, without MRG.  Pulses  are 2+ & equal.            No carotid bruit. No JVD.  No abdominal bruits. No femoral bruits. Abdomen: Bowel sounds are positive, abdomen soft and non-tender without masses or                  Hernia's noted. Msk:  Back normal, normal gait. Normal strength and tone for age. Extremities: No clubbing, cyanosis or edema.  DP +1 Neuro: Alert and oriented X 3. Psych:  Good affect, responds appropriately  Labs:   BNP: 5352 02/02/2011  Lab Results  Component Value Date   WBC 6.9 02/03/2011   HGB 10.4* 02/03/2011   HCT 30.9* 02/03/2011   MCV 85.6 02/03/2011   PLT 230 02/03/2011    Lab 02/03/11 0455 02/02/11 2350  NA 135 --  K 4.3 --  CL 100 --  CO2 29 --  BUN 7 --  CREATININE 0.79 --  CALCIUM 9.2 --  PROT -- 6.3  BILITOT -- 0.2*  ALKPHOS -- 95  ALT -- 16  AST -- 16  GLUCOSE 410* --   Lab Results  Component Value Date   CKTOTAL 58 02/03/2011   CKMB 3.0 02/03/2011   TROPONINI <0.30 02/03/2011    Lab Results  Component Value Date   CHOL  Value: 126        ATP III CLASSIFICATION:  <200     mg/dL   Desirable  200-239  mg/dL   Borderline High  >=240    mg/dL   High        12/26/2008   Lab Results  Component Value Date   HDL 54 12/26/2008   Lab Results  Component Value Date   LDLCALC  Value: 48        Total Cholesterol/HDL:CHD Risk Coronary Heart Disease Risk Table                     Men   Women  1/2 Average Risk   3.4   3.3  Average Risk       5.0   4.4  2 X Average Risk   9.6   7.1  3 X Average Risk  23.4   11.0        Use the calculated Patient Ratio above and the CHD Risk Table to determine the patient's CHD Risk.        ATP III CLASSIFICATION (LDL):  <100     mg/dL   Optimal  100-129  mg/dL   Near or Above                    Optimal  130-159  mg/dL   Borderline  160-189  mg/dL   High  >190     mg/dL   Very High 12/26/2008   Lab Results  Component Value Date   TRIG 118 12/26/2008   Lab Results  Component Value Date   CHOLHDL 2.3 12/26/2008   No results found for this basename:  LDLDIRECT      Radiology: Dg Chest 2 View  02/03/2011  *RADIOLOGY REPORT*  Clinical Data: Shortness of breath.  Pleural effusions.  CHEST - 2 VIEW  Comparison: CT scan of the chest and chest x-ray dated 02/02/2011  Findings: Chronic cardiomegaly.  Persistent pulmonary vascular prominence.  Slight increase in the loculated right pleural effusion.  Increased small left effusion.  Interstitial markings are increased bilaterally probably representing interstitial edema.  IMPRESSION: Increased effusions and interstitial edema.  Original Report Authenticated By: Larey Seat, M.D.   Ct Chest W Contrast  02/02/2011  *RADIOLOGY REPORT*  Clinical Data: Short of breath and leg swelling.  Fever.  CT CHEST WITH CONTRAST    IMPRESSION: 1.  Moderate CHF with small to moderate bilateral pleural effusions which appear predominately free-flowing. 2.  Cardiomegaly and pulmonary arterial hypertension. 3.  Atherosclerosis and coronary artery disease. 4.  Left thyroid lobe low density lesion.  Follow-up thyroid ultrasound recommended. 5.  Right upper pole renal scarring. 6.  Mild mediastinal adenopathy is probably congestive.  Original Report Authenticated By: Dereck Ligas, M.D.   Dg Chest Port 1 View  02/02/2011  *RADIOLOGY REPORT*  Clinical Data: Chest pain and shortness of breath.  PORTABLE CHEST -  IMPRESSION: Right lower lobe pneumonia and associated parapneumonic effusion.  Original Report Authenticated By: P. Kalman Jewels, M.D.   EKG: Normal sinus rhythm, IVCD, delayed R wave progression, ST-T wave abnormality-consider lateral ischemia, LVH or secondary to IVCD.  No previous tracing for comparison.  ASSESSMENT AND PLAN:   1. Acute CHF: Elevated BNP with associated shortness of breath and fluid retention on admission. Echocardiogram results are pending for evaluation of LV function. Most likely diastolic dysfunction in the setting of long-term hypertension. However she does have multiple cardiovascular  risk factors which could lead to ischemia.. Currently she appears euvolemic with good diuresis response to the IV Lasix. Creatinine is stable.   2. Multiple cardiovascular risk factors: Continued risk factor modification is recommended. Cardiac enzymes are found to be negative this admission. Will need to have an outpatient stress test should echocardiogram revealed normal LV function. Lipid studies have not been performed since 2010 will order these for continued evaluation.  3. Anemia: Review of anemia profile completed this admission shows low serum iron, and transferrin saturation, but above normal ferritin. Cannot determine whether iron deficiency is present based upon these results.   4. Hypertension: On arrival to the ER the patient was very hypertensive with a blood pressure of 232/106. She has remained hypertensive during admission as well. Review of home meds as her on benazepril 40 mg by mouth daily prior to admission. Hospitalist service has added amlodipine 5 mg by mouth daily with  better control of blood pressure most recent recording at 134/66. Potassium status is been within normal limits on the ACE inhibitor, and renal function has remained stable as well. She will need cardiology followup as an outpatient we'll be happy to see her at that time.  Phill Myron. Purcell Nails NP [facility] 02/03/2011, 2:53 PM   Cardiology Attending Patient interviewed and examined. Discussed with Jory Sims, NP.  Above note annotated and modified  based upon my findings.  Patient presents with subacute onset of congestive heart failure. My hope is that the cause was hypertensive urgency and that adequate management of hypertension will forestall repeat events; however, it is equally likely that exacerbation of hypertension was caused by the presence of congestive heart failure.  LV systolic function is unknown. An echocardiogram will be ordered. Exam does not suggest the presence of significant  valvular abnormalities. Her rapid response to initial therapy is a good sign.  Anemia will require additional evaluation.  Diabetic control also will require attention. Lipid profile is pending, but current  pharmacologic therapy is likely adequate. We will be happy to follow this nice woman in hospital and subsequent to discharge.  Jacqulyn Ducking, MD 02/03/2011, 7:44 PM

## 2011-02-03 NOTE — Progress Notes (Signed)
UR Chart Review Completed  

## 2011-02-03 NOTE — Progress Notes (Signed)
*  PRELIMINARY RESULTS* Echocardiogram 2D Echocardiogram has been performed.  Tera Partridge 02/03/2011, 2:09 PM

## 2011-02-03 NOTE — Progress Notes (Signed)
CSW received referral for medication assistance.  CM aware and CSW to sign off as no other apparent needs.  Salome Arnt

## 2011-02-03 NOTE — Progress Notes (Signed)
Subjective: This lady was admitted yesterday with the symptoms of congestive heart failure. She tells me that she has been dyspneic for the last week or so without any cough, fever, chest pain. She also describes leg swelling since the last couple of months. She does have a history of tricuspid regurgitation. She has not seen a cardiologist for many years and does not follow one now.           Physical Exam: Blood pressure 184/81, pulse 79, temperature 98.4 F (36.9 C), temperature source Oral, resp. rate 20, height 5\' 7"  (1.702 m), weight 68.8 kg (151 lb 10.8 oz), SpO2 94.00%. She looks somewhat short of breath at rest and there is increased work of breathing. I wonder whether she has got clubbing. There is no peripheral central cyanosis. Heart sounds are present and in sinus rhythm without any murmurs heard. Jugular venous pressure does appear to be raised approximately 2-3 cm above the angle of Louis. Lung fields show reduced air entry in both bases and clinically she has bilateral pleural effusions. There are a few crackles at the bases. There is no wheezing. There is no bronchial breathing. She does not have any significant peripheral pitting edema at this morning. Abdomen is soft and nontender with no evidence of hepatomegaly. She is alert and orientated. There are no focal neurological signs.   Investigations:     Basic Metabolic Panel:  Basename 02/03/11 0455 02/02/11 2350 02/02/11 1952  NA 135 -- 138  K 4.3 -- 2.8*  CL 100 -- 100  CO2 29 -- 30  GLUCOSE 410* -- 413*  BUN 7 -- 9  CREATININE 0.79 -- 0.82  CALCIUM 9.2 -- 9.5  MG 1.6 1.7 --  PHOS -- -- --   Liver Function Tests:  Basename 02/02/11 2350  AST 16  ALT 16  ALKPHOS 95  BILITOT 0.2*  PROT 6.3  ALBUMIN 2.8*     CBC:  Basename 02/03/11 0455 02/02/11 1952  WBC 6.9 5.8  NEUTROABS -- 3.9  HGB 10.4* 10.5*  HCT 30.9* 31.4*  MCV 85.6 85.1  PLT 230 242    Ct Chest W Contrast  02/02/2011  *RADIOLOGY  REPORT*  Clinical Data: Short of breath and leg swelling.  Fever.  CT CHEST WITH CONTRAST  Technique:  Multidetector CT imaging of the chest was performed following the standard protocol during bolus administration of intravenous contrast.  Contrast: 47mL OMNIPAQUE IOHEXOL 300 MG/ML IV SOLN  Comparison: 02/02/2011.  Findings: Cardiomegaly.  Pulmonary arterial hypertension.  Mild mediastinal adenopathy is present which is probably congestive. Bilateral pleural effusions are present, small and layering dependently.  Fluid tracks in the right major fissure.  Scattered ground-glass attenuation is present in the lungs, most compatible with alveolar edema.  Interlobular septal thickening is present indicating interstitial pulmonary edema.  High attenuation is present in the right atrium, probably representing old calcified mural thrombus.  Incidental imaging the upper abdomen demonstrates right upper pole renal scarring.  There is no pericardial effusion.  Median sternotomy is present. Coronary artery atherosclerosis is present. If office based assessment of coronary risk factors has not been performed, it is now recommended.  Thoracic spondylosis is present.  No aggressive osseous lesions are present.  There is no focal airspace consolidation.  Basilar atelectasis is present associated with effusions.  There is a nonspecific low density lesion within the left thyroid lobe measuring 17 mm.  IMPRESSION: 1.  Moderate CHF with small to moderate bilateral pleural effusions which appear predominately free-flowing.  2.  Cardiomegaly and pulmonary arterial hypertension. 3.  Atherosclerosis and coronary artery disease. 4.  Left thyroid lobe low density lesion.  Follow-up thyroid ultrasound recommended. 5.  Right upper pole renal scarring. 6.  Mild mediastinal adenopathy is probably congestive.  Original Report Authenticated By: Dereck Ligas, M.D.   Dg Chest Port 1 View  02/02/2011  *RADIOLOGY REPORT*  Clinical Data: Chest  pain and shortness of breath.  PORTABLE CHEST - 1 VIEW  Comparison: Chest x-ray 12/24/2008.  Findings: The heart is mildly enlarged.  There is tortuosity and calcification of the thoracic aorta.  There are chronic underlying bronchitic type lung changes with superimposed right lower lobe infiltrate and effusion.  The left lung base demonstrate scarring changes and atelectasis.  IMPRESSION: Right lower lobe pneumonia and associated parapneumonic effusion.  Original Report Authenticated By: P. Kalman Jewels, M.D.      Medications: I have reviewed the patient's current medications.  Impression: 1. Acute congestive heart failure likely diastolic. 2. Uncontrolled hypertension, likely causing #1. 3. Uncontrolled type 2 diabetes mellitus. 4. COPD without exacerbation. 5. Anemia, likely of chronic disease.     Plan: 1. Continue intravenous diuresis with Lasix. 2. Add Norvasc to better control blood pressure. 3. Await echocardiogram report. 4. Cardiology consultation.     LOS: 1 day   Doree Albee Pager [phone]  02/03/2011, 8:42 AM

## 2011-02-04 ENCOUNTER — Inpatient Hospital Stay (HOSPITAL_COMMUNITY): Payer: Medicare HMO

## 2011-02-04 DIAGNOSIS — I5031 Acute diastolic (congestive) heart failure: Principal | ICD-10-CM | POA: Diagnosis present

## 2011-02-04 LAB — LIPID PANEL
Cholesterol: 177 mg/dL (ref 0–200)
LDL Cholesterol: 82 mg/dL (ref 0–99)
Total CHOL/HDL Ratio: 3.1 RATIO
Triglycerides: 188 mg/dL — ABNORMAL HIGH (ref <150)
VLDL: 38 mg/dL (ref 0–40)

## 2011-02-04 LAB — BASIC METABOLIC PANEL
BUN: 11 mg/dL (ref 6–23)
CO2: 32 mEq/L (ref 19–32)
Calcium: 9.2 mg/dL (ref 8.4–10.5)
Glucose, Bld: 274 mg/dL — ABNORMAL HIGH (ref 70–99)
Sodium: 136 mEq/L (ref 135–145)

## 2011-02-04 LAB — GLUCOSE, CAPILLARY: Glucose-Capillary: 346 mg/dL — ABNORMAL HIGH (ref 70–99)

## 2011-02-04 MED ORDER — FUROSEMIDE 40 MG PO TABS
40.0000 mg | ORAL_TABLET | Freq: Two times a day (BID) | ORAL | Status: DC
  Administered 2011-02-04 – 2011-02-06 (×4): 40 mg via ORAL
  Filled 2011-02-04 (×4): qty 1

## 2011-02-04 MED ORDER — BIOTENE DRY MOUTH MT LIQD
15.0000 mL | Freq: Two times a day (BID) | OROMUCOSAL | Status: DC
  Administered 2011-02-04 – 2011-02-06 (×6): 15 mL via OROMUCOSAL

## 2011-02-04 NOTE — Progress Notes (Signed)
Subjective: She is feeling better, denies any chest pain, shortness of breath.  Was ambulating to bathroom off of oxygen.    Objective: Vital signs in last 24 hours: Temp:  [98.2 F (36.8 C)-99.7 F (37.6 C)] 98.2 F (36.8 C) (01/12 0500) Pulse Rate:  [67-89] 70  (01/12 0539) Resp:  [18] 18  (01/12 0500) BP: (134-218)/(66-96) 160/75 mmHg (01/12 0500) SpO2:  [90 %-98 %] 98 % (01/12 0500) Weight change:  Last BM Date: 02/04/11  Intake/Output from previous day: 01/11 0701 - 01/12 0700 In: 848 [P.O.:840; IV Piggyback:8] Out: 1701 [Urine:1700; Stool:1] Total I/O In: 600 [P.O.:600] Out: -    Physical Exam: General: Alert, awake, oriented x3, in no acute distress. HEENT: No bruits, no goiter. Heart: Regular rate and rhythm, without murmurs, rubs, gallops. Lungs: Clear to auscultation bilaterally. Abdomen: Soft, nontender, nondistended, positive bowel sounds. Extremities: trace edema b/l Neuro: Grossly intact, nonfocal.    Lab Results: Basic Metabolic Panel:  Basename 02/04/11 0655 02/03/11 0455 02/02/11 2350  NA 136 135 --  K 5.0 4.3 --  CL 100 100 --  CO2 32 29 --  GLUCOSE 274* 410* --  BUN 11 7 --  CREATININE 1.10 0.79 --  CALCIUM 9.2 9.2 --  MG -- 1.6 1.7  PHOS -- -- --   Liver Function Tests:  Basename 02/02/11 2350  AST 16  ALT 16  ALKPHOS 95  BILITOT 0.2*  PROT 6.3  ALBUMIN 2.8*   No results found for this basename: LIPASE:2,AMYLASE:2 in the last 72 hours No results found for this basename: AMMONIA:2 in the last 72 hours CBC:  Basename 02/03/11 0455 02/02/11 1952  WBC 6.9 5.8  NEUTROABS -- 3.9  HGB 10.4* 10.5*  HCT 30.9* 31.4*  MCV 85.6 85.1  PLT 230 242   Cardiac Enzymes:  Basename 02/03/11 1545 02/03/11 0746 02/02/11 2350  CKTOTAL 59 58 59  CKMB 2.6 3.0 3.2  CKMBINDEX -- -- --  TROPONINI <0.30 <0.30 <0.30   BNP:  Basename 02/04/11 0655 02/02/11 1953  PROBNP 2968.0* 5352.0*   D-Dimer: No results found for this basename: DDIMER:2  in the last 72 hours CBG:  Basename 02/04/11 0723 02/03/11 2107 02/03/11 1606 02/03/11 1129 02/03/11 0835  GLUCAP 254* 231* 111* 213* 377*   Hemoglobin A1C:  Basename 02/02/11 2350  HGBA1C 13.1*   Fasting Lipid Panel:  Basename 02/04/11 0655  CHOL 177  HDL 57  LDLCALC 82  TRIG 188*  CHOLHDL 3.1  LDLDIRECT --   Thyroid Function Tests:  Basename 02/02/11 2350  TSH 2.117  T4TOTAL --  FREET4 --  T3FREE --  THYROIDAB --   Anemia Panel:  Basename 02/02/11 2233  VITAMINB12 522  FOLATE 19.7  FERRITIN 306*  TIBC 211*  IRON 33*  RETICCTPCT 2.0   Coagulation:  Basename 02/02/11 2350  LABPROT 14.2  INR 1.08   Urine Drug Screen: Drugs of Abuse  No results found for this basename: labopia,  cocainscrnur,  labbenz,  amphetmu,  thcu,  labbarb    Alcohol Level: No results found for this basename: ETH:2 in the last 72 hours  Recent Results (from the past 240 hour(s))  CULTURE, BLOOD (ROUTINE X 2)     Status: Normal (Preliminary result)   Collection Time   02/02/11  8:09 PM      Component Value Range Status Comment   Specimen Description BLOOD RIGHT ANTECUBITAL   Final    Special Requests BOTTLES DRAWN AEROBIC AND ANAEROBIC Roland   Final  Culture NO GROWTH 2 DAYS   Final    Report Status PENDING   Incomplete   CULTURE, BLOOD (ROUTINE X 2)     Status: Normal (Preliminary result)   Collection Time   02/02/11  8:17 PM      Component Value Range Status Comment   Specimen Description BLOOD RIGHT ARM   Final    Special Requests BOTTLES DRAWN AEROBIC AND ANAEROBIC 6CC   Final    Culture NO GROWTH 2 DAYS   Final    Report Status PENDING   Incomplete     Studies/Results: Dg Chest 1 View  02/04/2011  *RADIOLOGY REPORT*  Clinical Data: Congestive heart failure.  CHEST - 1 VIEW  Comparison: Plain film chest 02/03/2011 and CT chest 02/02/2011.  Findings: Again seen is cardiomegaly.  The patient's bilateral pleural effusions appear slightly decreased in size.  Bibasilar  airspace opacities are unchanged.  IMPRESSION: Some decrease in bilateral pleural effusions.  No other change.  Original Report Authenticated By: Arvid Right. Luther Parody, M.D.   Dg Chest 2 View  02/03/2011  *RADIOLOGY REPORT*  Clinical Data: Shortness of breath.  Pleural effusions.  CHEST - 2 VIEW  Comparison: CT scan of the chest and chest x-ray dated 02/02/2011  Findings: Chronic cardiomegaly.  Persistent pulmonary vascular prominence.  Slight increase in the loculated right pleural effusion.  Increased small left effusion.  Interstitial markings are increased bilaterally probably representing interstitial edema.  IMPRESSION: Increased effusions and interstitial edema.  Original Report Authenticated By: Larey Seat, M.D.   Ct Chest W Contrast  02/02/2011  *RADIOLOGY REPORT*  Clinical Data: Short of breath and leg swelling.  Fever.  CT CHEST WITH CONTRAST  Technique:  Multidetector CT imaging of the chest was performed following the standard protocol during bolus administration of intravenous contrast.  Contrast: 12mL OMNIPAQUE IOHEXOL 300 MG/ML IV SOLN  Comparison: 02/02/2011.  Findings: Cardiomegaly.  Pulmonary arterial hypertension.  Mild mediastinal adenopathy is present which is probably congestive. Bilateral pleural effusions are present, small and layering dependently.  Fluid tracks in the right major fissure.  Scattered ground-glass attenuation is present in the lungs, most compatible with alveolar edema.  Interlobular septal thickening is present indicating interstitial pulmonary edema.  High attenuation is present in the right atrium, probably representing old calcified mural thrombus.  Incidental imaging the upper abdomen demonstrates right upper pole renal scarring.  There is no pericardial effusion.  Median sternotomy is present. Coronary artery atherosclerosis is present. If office based assessment of coronary risk factors has not been performed, it is now recommended.  Thoracic spondylosis is  present.  No aggressive osseous lesions are present.  There is no focal airspace consolidation.  Basilar atelectasis is present associated with effusions.  There is a nonspecific low density lesion within the left thyroid lobe measuring 17 mm.  IMPRESSION: 1.  Moderate CHF with small to moderate bilateral pleural effusions which appear predominately free-flowing. 2.  Cardiomegaly and pulmonary arterial hypertension. 3.  Atherosclerosis and coronary artery disease. 4.  Left thyroid lobe low density lesion.  Follow-up thyroid ultrasound recommended. 5.  Right upper pole renal scarring. 6.  Mild mediastinal adenopathy is probably congestive.  Original Report Authenticated By: Dereck Ligas, M.D.   Dg Chest Port 1 View  02/02/2011  *RADIOLOGY REPORT*  Clinical Data: Chest pain and shortness of breath.  PORTABLE CHEST - 1 VIEW  Comparison: Chest x-ray 12/24/2008.  Findings: The heart is mildly enlarged.  There is tortuosity and calcification of the thoracic aorta.  There are chronic underlying bronchitic type lung changes with superimposed right lower lobe infiltrate and effusion.  The left lung base demonstrate scarring changes and atelectasis.  IMPRESSION: Right lower lobe pneumonia and associated parapneumonic effusion.  Original Report Authenticated By: P. Kalman Jewels, M.D.    Medications: Scheduled Meds:    . amLODipine  5 mg Oral Daily  . antiseptic oral rinse  15 mL Mouth Rinse BID  . benazepril  40 mg Oral Daily  . enoxaparin  40 mg Subcutaneous Daily  . furosemide  40 mg Intravenous TID  . glipiZIDE  10 mg Oral BID  . influenza  inactive virus vaccine  0.5 mL Intramuscular NOW  . insulin aspart  0-15 Units Subcutaneous TID WC  . insulin aspart  0-5 Units Subcutaneous QHS  . nitroGLYCERIN  1 inch Topical Q6H  . potassium chloride  40 mEq Oral BID  . rosuvastatin  10 mg Oral q1800  . senna-docusate  2 tablet Oral QHS  . sodium chloride      . DISCONTD: furosemide  40 mg Intravenous BID     Continuous Infusions:  PRN Meds:.acetaminophen, albuterol, bisacodyl, hydrALAZINE, ipratropium, ondansetron (ZOFRAN) IV, sodium phosphate, zolpidem  Assessment/Plan:  Principal Problem:  *Acute diastolic heart failure Active Problems:  Hypertension  Hyperlipidemia  Hypertensive urgency  Diabetes type 2, uncontrolled  Pulmonary hypertension  COPD (chronic obstructive pulmonary disease)  Hypokalemia  Normocytic anemia   Plan:  Patient continues to improve from a symptom standpoint.  She is doing well on IV lasix and I feel we can transition her to oral lasix.  Currently she is receiving IV lasix tid, we will change it to PO lasix bid.  Cardiac markers are negativex3, and she is not having any symptoms of ischemia.  Echo shows a preserved EF,  And mild hypokinesis of the anteroseptal myocardium.  This will need to be addressed by cardiology inpatient vs. Outpatient. Her hypertension has also improved on her current regimen Anemia is mild and can likely be further worked up as an outpatient. If continues to improve, then can likely go home tomorrow. Will check oxygen sats on room air.   LOS: 2 days   MEMON,JEHANZEB [facility]  02/04/2011, 10:05 AM

## 2011-02-05 LAB — GLUCOSE, CAPILLARY
Glucose-Capillary: 269 mg/dL — ABNORMAL HIGH (ref 70–99)
Glucose-Capillary: 254 mg/dL — ABNORMAL HIGH (ref 70–99)
Glucose-Capillary: 309 mg/dL — ABNORMAL HIGH (ref 70–99)

## 2011-02-05 LAB — BASIC METABOLIC PANEL
CO2: 32 mEq/L (ref 19–32)
Chloride: 98 mEq/L (ref 96–112)
GFR calc Af Amer: 52 mL/min — ABNORMAL LOW (ref >90)
Potassium: 4.8 mEq/L (ref 3.5–5.1)

## 2011-02-05 MED ORDER — LINAGLIPTIN 5 MG PO TABS
5.0000 mg | ORAL_TABLET | Freq: Every day | ORAL | Status: DC
  Administered 2011-02-05 – 2011-02-06 (×2): 5 mg via ORAL
  Filled 2011-02-05 (×4): qty 1

## 2011-02-05 NOTE — Progress Notes (Signed)
Subjective: Feels good, denies any shortness of breath, still having good urine output  Objective: Vital signs in last 24 hours: Temp:  [97.9 F (36.6 C)-98.6 F (37 C)] 98.2 F (36.8 C) (01/13 0554) Pulse Rate:  [59-72] 62  (01/13 0554) Resp:  [18] 18  (01/13 0554) BP: (129-184)/(66-82) 155/72 mmHg (01/13 0554) SpO2:  [87 %-96 %] 95 % (01/13 0554) Weight:  [67.7 kg (149 lb 4 oz)] 67.7 kg (149 lb 4 oz) (01/13 0445) Weight change:  Last BM Date: 02/04/11  Intake/Output from previous day: 01/12 0701 - 01/13 0700 In: 1320 [P.O.:1320] Out: 300 [Urine:300]     Physical Exam: General: Alert, awake, oriented x3, in no acute distress. HEENT: No bruits, no goiter. Heart: Regular rate and rhythm, without murmurs, rubs, gallops. Lungs: Clear to auscultation bilaterally. Abdomen: Soft, nontender, nondistended, positive bowel sounds. Extremities: LE edema improving. Neuro: Grossly intact, nonfocal.    Lab Results: Basic Metabolic Panel:  Basename 02/05/11 0627 02/04/11 0655 02/03/11 0455 02/02/11 2350  NA 135 136 -- --  K 4.8 5.0 -- --  CL 98 100 -- --  CO2 32 32 -- --  GLUCOSE 287* 274* -- --  BUN 17 11 -- --  CREATININE 1.19* 1.10 -- --  CALCIUM 9.5 9.2 -- --  MG -- -- 1.6 1.7  PHOS -- -- -- --   Liver Function Tests:  Basename 02/02/11 2350  AST 16  ALT 16  ALKPHOS 95  BILITOT 0.2*  PROT 6.3  ALBUMIN 2.8*   No results found for this basename: LIPASE:2,AMYLASE:2 in the last 72 hours No results found for this basename: AMMONIA:2 in the last 72 hours CBC:  Basename 02/03/11 0455 02/02/11 1952  WBC 6.9 5.8  NEUTROABS -- 3.9  HGB 10.4* 10.5*  HCT 30.9* 31.4*  MCV 85.6 85.1  PLT 230 242   Cardiac Enzymes:  Basename 02/03/11 1545 02/03/11 0746 02/02/11 2350  CKTOTAL 59 58 59  CKMB 2.6 3.0 3.2  CKMBINDEX -- -- --  TROPONINI <0.30 <0.30 <0.30   BNP:  Basename 02/04/11 0655 02/02/11 1953  PROBNP 2968.0* 5352.0*   D-Dimer: No results found for this  basename: DDIMER:2 in the last 72 hours CBG:  Basename 02/05/11 0730 02/04/11 2138 02/04/11 1639 02/04/11 1105 02/04/11 0723 02/03/11 2107  GLUCAP 269* 346* 86 399* 254* 231*   Hemoglobin A1C:  Basename 02/02/11 2350  HGBA1C 13.1*   Fasting Lipid Panel:  Basename 02/04/11 0655  CHOL 177  HDL 57  LDLCALC 82  TRIG 188*  CHOLHDL 3.1  LDLDIRECT --   Thyroid Function Tests:  Basename 02/02/11 2350  TSH 2.117  T4TOTAL --  FREET4 --  T3FREE --  THYROIDAB --   Anemia Panel:  Basename 02/02/11 2233  VITAMINB12 522  FOLATE 19.7  FERRITIN 306*  TIBC 211*  IRON 33*  RETICCTPCT 2.0   Coagulation:  Basename 02/02/11 2350  LABPROT 14.2  INR 1.08   Urine Drug Screen: Drugs of Abuse  No results found for this basename: labopia, cocainscrnur, labbenz, amphetmu, thcu, labbarb    Alcohol Level: No results found for this basename: ETH:2 in the last 72 hours  Recent Results (from the past 240 hour(s))  CULTURE, BLOOD (ROUTINE X 2)     Status: Normal (Preliminary result)   Collection Time   02/02/11  8:09 PM      Component Value Range Status Comment   Specimen Description BLOOD RIGHT ANTECUBITAL   Final    Special Requests BOTTLES DRAWN AEROBIC AND  ANAEROBIC 6CC   Final    Culture NO GROWTH 3 DAYS   Final    Report Status PENDING   Incomplete   CULTURE, BLOOD (ROUTINE X 2)     Status: Normal (Preliminary result)   Collection Time   02/02/11  8:17 PM      Component Value Range Status Comment   Specimen Description BLOOD RIGHT ARM   Final    Special Requests BOTTLES DRAWN AEROBIC AND ANAEROBIC 6CC   Final    Culture NO GROWTH 3 DAYS   Final    Report Status PENDING   Incomplete     Studies/Results: Dg Chest 1 View  02/04/2011  *RADIOLOGY REPORT*  Clinical Data: Congestive heart failure.  CHEST - 1 VIEW  Comparison: Plain film chest 02/03/2011 and CT chest 02/02/2011.  Findings: Again seen is cardiomegaly.  The patient's bilateral pleural effusions appear slightly  decreased in size.  Bibasilar airspace opacities are unchanged.  IMPRESSION: Some decrease in bilateral pleural effusions.  No other change.  Original Report Authenticated By: Arvid Right. Luther Parody, M.D.    Medications: Scheduled Meds:   . amLODipine  5 mg Oral Daily  . antiseptic oral rinse  15 mL Mouth Rinse BID  . benazepril  40 mg Oral Daily  . enoxaparin  40 mg Subcutaneous Daily  . furosemide  40 mg Oral BID  . glipiZIDE  10 mg Oral BID  . insulin aspart  0-15 Units Subcutaneous TID WC  . insulin aspart  0-5 Units Subcutaneous QHS  . rosuvastatin  10 mg Oral q1800  . senna-docusate  2 tablet Oral QHS   Continuous Infusions:  PRN Meds:.acetaminophen, albuterol, bisacodyl, hydrALAZINE, ipratropium, ondansetron (ZOFRAN) IV, sodium phosphate, zolpidem  Assessment/Plan:  Principal Problem:  *Acute diastolic heart failure Active Problems:  Hypertension  Hyperlipidemia  Hypertensive urgency  Diabetes type 2, uncontrolled  Pulmonary hypertension  COPD (chronic obstructive pulmonary disease)  Hypokalemia  Normocytic anemia  Plan:  Patient appears to be improving. Her weight is trending down, but current Is and Os do not appear to be accurate. Her lasix has been changed to po and she appears to be tolerating this BP has also improved She did desaturate while on room air, she may need to discharge with some oxygen Will await input from cardiology regarding any further inpatient testing Keep patient NPO after midnight in case any stress test is required in the morning. Add januvia to her diabetic regimen.   LOS: 3 days   MEMON,JEHANZEB [facility]  02/05/2011, 11:43 AM

## 2011-02-05 NOTE — Progress Notes (Signed)
Patient ambulated 300 feet without o2 and stayed at 95%

## 2011-02-06 LAB — BASIC METABOLIC PANEL
Calcium: 9.8 mg/dL (ref 8.4–10.5)
Creatinine, Ser: 1.21 mg/dL — ABNORMAL HIGH (ref 0.50–1.10)
GFR calc Af Amer: 51 mL/min — ABNORMAL LOW (ref >90)
GFR calc non Af Amer: 44 mL/min — ABNORMAL LOW (ref >90)

## 2011-02-06 LAB — CULTURE, BLOOD (ROUTINE X 2): Culture: NO GROWTH

## 2011-02-06 MED ORDER — FUROSEMIDE 40 MG PO TABS: 40.0000 mg | ORAL_TABLET | Freq: Every day | ORAL | Status: DC

## 2011-02-06 MED ORDER — INSULIN PEN STARTER KIT
1.0000 | Freq: Once | Status: DC
  Filled 2011-02-06: qty 1

## 2011-02-06 MED ORDER — AMLODIPINE BESYLATE 5 MG PO TABS: 10.0000 mg | ORAL_TABLET | Freq: Every day | ORAL | Status: DC

## 2011-02-06 MED ORDER — INSULIN GLARGINE 100 UNIT/ML ~~LOC~~ SOLN: 15.0000 [IU] | Freq: Every day | SUBCUTANEOUS | Status: DC

## 2011-02-06 NOTE — Progress Notes (Signed)
Pt d/c home today. Very independent of adls. Pt is d/c home on insulin. States she has no d/c needs. Pt feels she can manage the insulin.

## 2011-02-06 NOTE — Progress Notes (Signed)
02/06/11 1540 patient being discharged home. Reviewed discharge instructions with patient, given copy of instructions, med list, prescriptions, carenote, f/u appointments, and stress test appointment. Instructed to have nothing to eat or drink after midnight before stress test, stated she understood instructions, already received heart failure packet. Discussed heart failure education today via teach back method, patient able to repeat back all 4 indications for calling MD that were taught (weight gain, swelling, shortness of breath, sleeping with extra pillows). Given bathroom scale prior to discharge and discussed importance of daily weights. Stated she understood. Reviewed correct technique for using Lantus insulin pen for discharge. Patient able to demonstrate correct technique via practice kit on unit. Successfully prepared needle, dialed up dose, and administered via practice kit/education station on unit. No flexpen starter kit available, MD aware. Denies pain or discomfort at this time. IV site and telemetry d/c'd. Pt in stable condition awaiting son for discharge home.

## 2011-02-06 NOTE — Progress Notes (Signed)
02/06/11 1615 patient left floor in stable condition via w/c accompanied by nurse tech for discharge home.

## 2011-02-06 NOTE — Progress Notes (Deleted)
Physician Discharge Summary  Patient ID: Laura Best MRN: [id number] DOB/AGE: 06/28/1940 71 y.o.  Admit date: 02/02/2011 Discharge date: 02/06/2011  Primary Care Physician:  No primary provider on file.   Discharge Diagnoses:    Principal Problem:  *Acute diastolic heart failure Active Problems:  Hypertension  Hyperlipidemia  Hypertensive urgency  Diabetes type 2, uncontrolled  Pulmonary hypertension  COPD (chronic obstructive pulmonary disease)  Hypokalemia  Normocytic anemia    Current Discharge Medication List    START taking these medications   Details  amLODipine (NORVASC) 5 MG tablet Take 2 tablets (10 mg total) by mouth daily. Qty: 30 tablet, Refills: 1    furosemide (LASIX) 40 MG tablet Take 1 tablet (40 mg total) by mouth daily. Qty: 30 tablet, Refills: 1    insulin glargine (LANTUS) 100 UNIT/ML injection Inject 15 Units into the skin daily. SOLOSTAR PEN Qty: 3 mL, Refills: 12      CONTINUE these medications which have NOT CHANGED   Details  alendronate (FOSAMAX) 70 MG tablet Take 70 mg by mouth every 7 (seven) days. Take with a full glass of water on an empty stomach.     benazepril (LOTENSIN) 40 MG tablet Take 40 mg by mouth daily.      calcium citrate-vitamin D (CITRACAL+D) 315-200 MG-UNIT per tablet Take 1 tablet by mouth 2 (two) times daily.      Cholecalciferol (VITAMIN D) 2000 UNITS CAPS Take 1 capsule by mouth 1 dose over 46 hours.      glipiZIDE (GLUCOTROL) 10 MG 24 hr tablet Take 10 mg by mouth daily.      simvastatin (ZOCOR) 40 MG tablet Take 40 mg by mouth at bedtime.        STOP taking these medications     pioglitazone (ACTOS) 45 MG tablet          Disposition and Follow-up:  Follow up with [facility] cardiology for stress test (scheduled) as well as Dr. Lattie Haw for further cardiac follow up(scheduled) Follow up with primary doctor in 1-2 weeks  Consults: [facility] cardiology   Significant Diagnostic Studies:  Dg Chest 2  View  02/03/2011  *RADIOLOGY REPORT*  Clinical Data: Shortness of breath.  Pleural effusions.  CHEST - 2 VIEW  Comparison: CT scan of the chest and chest x-ray dated 02/02/2011  Findings: Chronic cardiomegaly.  Persistent pulmonary vascular prominence.  Slight increase in the loculated right pleural effusion.  Increased small left effusion.  Interstitial markings are increased bilaterally probably representing interstitial edema.  IMPRESSION: Increased effusions and interstitial edema.  Original Report Authenticated By: Larey Seat, M.D.   Ct Chest W Contrast  02/02/2011  *RADIOLOGY REPORT*  Clinical Data: Short of breath and leg swelling.  Fever.  CT CHEST WITH CONTRAST  Technique:  Multidetector CT imaging of the chest was performed following the standard protocol during bolus administration of intravenous contrast.  Contrast: 63mL OMNIPAQUE IOHEXOL 300 MG/ML IV SOLN  Comparison: 02/02/2011.  Findings: Cardiomegaly.  Pulmonary arterial hypertension.  Mild mediastinal adenopathy is present which is probably congestive. Bilateral pleural effusions are present, small and layering dependently.  Fluid tracks in the right major fissure.  Scattered ground-glass attenuation is present in the lungs, most compatible with alveolar edema.  Interlobular septal thickening is present indicating interstitial pulmonary edema.  High attenuation is present in the right atrium, probably representing old calcified mural thrombus.  Incidental imaging the upper abdomen demonstrates right upper pole renal scarring.  There is no pericardial effusion.  Median sternotomy is  present. Coronary artery atherosclerosis is present. If office based assessment of coronary risk factors has not been performed, it is now recommended.  Thoracic spondylosis is present.  No aggressive osseous lesions are present.  There is no focal airspace consolidation.  Basilar atelectasis is present associated with effusions.  There is a nonspecific low  density lesion within the left thyroid lobe measuring 17 mm.  IMPRESSION: 1.  Moderate CHF with small to moderate bilateral pleural effusions which appear predominately free-flowing. 2.  Cardiomegaly and pulmonary arterial hypertension. 3.  Atherosclerosis and coronary artery disease. 4.  Left thyroid lobe low density lesion.  Follow-up thyroid ultrasound recommended. 5.  Right upper pole renal scarring. 6.  Mild mediastinal adenopathy is probably congestive.  Original Report Authenticated By: Dereck Ligas, M.D.   Dg Chest Port 1 View  02/02/2011  *RADIOLOGY REPORT*  Clinical Data: Chest pain and shortness of breath.  PORTABLE CHEST - 1 VIEW  Comparison: Chest x-ray 12/24/2008.  Findings: The heart is mildly enlarged.  There is tortuosity and calcification of the thoracic aorta.  There are chronic underlying bronchitic type lung changes with superimposed right lower lobe infiltrate and effusion.  The left lung base demonstrate scarring changes and atelectasis.  IMPRESSION: Right lower lobe pneumonia and associated parapneumonic effusion.  Original Report Authenticated By: P. Kalman Jewels, M.D.    Brief H and P: For complete details please refer to admission H and P, but in brief Laura Best is an 71 y.o. Caucasian female. History of Chronic tobacco abuse, COPD, remote history of open heart surgery to repair a hole in the heart, questionable history of CHF, diabetes type 2. Chronic history of episodic leg edema.  Since Monday she's been having progressive shortness of breath has now become so severe that she eventually came to the emergency room to seek treatment here; she has to sit upright to catch her breath; she feels she may have been having a light fever, only scant white sputum, she denies any chest pain. He denies any exposure to sick contacts;  She has not had her flu immunization this season.  He is also noted progressive swelling of her abdomen for the past week; and the swelling of her  legs which usually go down and the evening remained persistently swollen.  Chest x-ray showed suggested pneumonia with effusion, and the hospitalist was called; CT scan of the chest was suggested.     Hospital Course:  Patient was admitted to the hospital with acute shortness of breath, edema and was treated for heart failure.  She has diuresed well with lasix and now is on a po regimen.  She was seen in consultation by cardiology and had 2D echo done, which showed EF of 55-60% and mild hypokinesis of the anteroseptal myocardium.  Her cardiac enzymes have been negative and she is symptom free at this time.  She has been scheduled for an outpatient stress test and then follow up with Dr. Lattie Haw.    Her diabetes remains uncontrolled.  A1C was checked and found to be 13.  She reports being compliant with her oral regimen.  Her pioglitazone was discontinued due to fluid retention issues.  I do not feel that adding an additional oral drug would bring down her blood sugars into an acceptable range.  I have discussed with the patient regarding starting insulin.  She is agreeable.  We will start her on low dose lantus at 15 units daily.  Further titration to be done as an outpatient.  Her hypertension also remains uncontrolled.  She has been started on norvasc, and despite what is listed in her allergies, has tolerated this well.    Regarding her anemia, this may be further worked up as an outpatient.   Time spent on Discharge: 49mins  Signed: [facility] 02/06/2011, 1:50 PM

## 2011-02-07 NOTE — Discharge Summary (Signed)
Patient ID:  Laura Best  MRN: [id number]  DOB/AGE: Jan 23, 1941 71 y.o.   Admit date: 02/02/2011   Discharge date: 02/06/2011   Primary Care Physician: [name], MD  Discharge Diagnoses:  Principal Problem:  *Acute diastolic heart failure  Active Problems:  Hypertension  Hyperlipidemia  Hypertensive urgency  Diabetes type 2, uncontrolled  Pulmonary hypertension  COPD (chronic obstructive pulmonary disease)  Hypokalemia  Normocytic anemia   Current Discharge Medication List     START taking these medications    Details   amLODipine (NORVASC) 5 MG tablet  Take 2 tablets (10 mg total) by mouth daily.  Qty: 30 tablet, Refills: 1     furosemide (LASIX) 40 MG tablet  Take 1 tablet (40 mg total) by mouth daily.  Qty: 30 tablet, Refills: 1     insulin glargine (LANTUS) 100 UNIT/ML injection  Inject 15 Units into the skin daily. SOLOSTAR PEN  Qty: 3 mL, Refills: 12       CONTINUE these medications which have NOT CHANGED    Details   alendronate (FOSAMAX) 70 MG tablet  Take 70 mg by mouth every 7 (seven) days. Take with a full glass of water on an empty stomach.     benazepril (LOTENSIN) 40 MG tablet  Take 40 mg by mouth daily.     calcium citrate-vitamin D (CITRACAL+D) 315-200 MG-UNIT per tablet  Take 1 tablet by mouth 2 (two) times daily.     Cholecalciferol (VITAMIN D) 2000 UNITS CAPS  Take 1 capsule by mouth 1 dose over 46 hours.     glipiZIDE (GLUCOTROL) 10 MG 24 hr tablet  Take 10 mg by mouth daily.     simvastatin (ZOCOR) 40 MG tablet  Take 40 mg by mouth at bedtime.       STOP taking these medications      pioglitazone (ACTOS) 45 MG tablet         Disposition and Follow-up:  Follow up with [facility] cardiology for stress test (scheduled) as well as Dr. Lattie Haw for further cardiac follow up(scheduled)  Follow up with primary doctor in 1-2 weeks   Consults: [facility] cardiology   Significant Diagnostic Studies:  Dg Chest 2 View  02/03/2011 *RADIOLOGY  REPORT* Clinical Data: Shortness of breath. Pleural effusions. CHEST - 2 VIEW Comparison: CT scan of the chest and chest x-ray dated 02/02/2011 Findings: Chronic cardiomegaly. Persistent pulmonary vascular prominence. Slight increase in the loculated right pleural effusion. Increased small left effusion. Interstitial markings are increased bilaterally probably representing interstitial edema. IMPRESSION: Increased effusions and interstitial edema. Original Report Authenticated By: Larey Seat, M.D.  Ct Chest W Contrast  02/02/2011 *RADIOLOGY REPORT* Clinical Data: Short of breath and leg swelling. Fever. CT CHEST WITH CONTRAST Technique: Multidetector CT imaging of the chest was performed following the standard protocol during bolus administration of intravenous contrast. Contrast: 52mL OMNIPAQUE IOHEXOL 300 MG/ML IV SOLN Comparison: 02/02/2011. Findings: Cardiomegaly. Pulmonary arterial hypertension. Mild mediastinal adenopathy is present which is probably congestive. Bilateral pleural effusions are present, small and layering dependently. Fluid tracks in the right major fissure. Scattered ground-glass attenuation is present in the lungs, most compatible with alveolar edema. Interlobular septal thickening is present indicating interstitial pulmonary edema. High attenuation is present in the right atrium, probably representing old calcified mural thrombus. Incidental imaging the upper abdomen demonstrates right upper pole renal scarring. There is no pericardial effusion. Median sternotomy is present. Coronary artery atherosclerosis is present. If office based assessment of coronary risk factors has not  been performed, it is now recommended. Thoracic spondylosis is present. No aggressive osseous lesions are present. There is no focal airspace consolidation. Basilar atelectasis is present associated with effusions. There is a nonspecific low density lesion within the left thyroid lobe measuring 17 mm.  IMPRESSION: 1. Moderate CHF with small to moderate bilateral pleural effusions which appear predominately free-flowing. 2. Cardiomegaly and pulmonary arterial hypertension. 3. Atherosclerosis and coronary artery disease. 4. Left thyroid lobe low density lesion. Follow-up thyroid ultrasound recommended. 5. Right upper pole renal scarring. 6. Mild mediastinal adenopathy is probably congestive. Original Report Authenticated By: Dereck Ligas, M.D.  Dg Chest Port 1 View  02/02/2011 *RADIOLOGY REPORT* Clinical Data: Chest pain and shortness of breath. PORTABLE CHEST - 1 VIEW Comparison: Chest x-ray 12/24/2008. Findings: The heart is mildly enlarged. There is tortuosity and calcification of the thoracic aorta. There are chronic underlying bronchitic type lung changes with superimposed right lower lobe infiltrate and effusion. The left lung base demonstrate scarring changes and atelectasis. IMPRESSION: Right lower lobe pneumonia and associated parapneumonic effusion. Original Report Authenticated By: P. Kalman Jewels, M.D.   Brief H and P:  For complete details please refer to admission H and P, but in brief Laura Best is an 71 y.o. Caucasian female. History of Chronic tobacco abuse, COPD, remote history of open heart surgery to repair a hole in the heart, questionable history of CHF, diabetes type 2. Chronic history of episodic leg edema.  Since Monday she's been having progressive shortness of breath has now become so severe that she eventually came to the emergency room to seek treatment here; she has to sit upright to catch her breath; she feels she may have been having a light fever, only scant white sputum, she denies any chest pain. He denies any exposure to sick contacts;  She has not had her flu immunization this season.  He is also noted progressive swelling of her abdomen for the past week; and the swelling of her legs which usually go down and the evening remained persistently swollen.  Chest x-ray  showed suggested pneumonia with effusion, and the hospitalist was called; CT scan of the chest was suggested.   Hospital Course:  Patient was admitted to the hospital with acute shortness of breath, edema and was treated for heart failure. She has diuresed well with lasix and now is on a po regimen. She was seen in consultation by cardiology and had 2D echo done, which showed EF of 55-60% and mild hypokinesis of the anteroseptal myocardium. Her cardiac enzymes have been negative and she is symptom free at this time. She has been scheduled for an outpatient stress test and then follow up with Dr. Lattie Haw.   Her diabetes remains uncontrolled. A1C was checked and found to be 13. She reports being compliant with her oral regimen. Her pioglitazone was discontinued due to fluid retention issues. I do not feel that adding an additional oral drug would bring down her blood sugars into an acceptable range. I have discussed with the patient regarding starting insulin. She is agreeable. We will start her on low dose lantus at 15 units daily. Further titration to be done as an outpatient.   Her hypertension also remains uncontrolled. She has been started on norvasc, and despite what is listed in her allergies, has tolerated this well.   Regarding her anemia, this may be further worked up as an outpatient.   Time spent on Discharge:  81mins

## 2011-02-10 ENCOUNTER — Other Ambulatory Visit: Payer: Self-pay | Admitting: Cardiology

## 2011-02-14 ENCOUNTER — Encounter (HOSPITAL_COMMUNITY)
Admission: RE | Admit: 2011-02-14 | Discharge: 2011-02-14 | Disposition: A | Discharge status: Home / Self Care | Payer: Medicare HMO | Source: Ambulatory Visit | Attending: Cardiology | Admitting: Cardiology

## 2011-02-14 ENCOUNTER — Encounter: Payer: Self-pay | Admitting: *Deleted

## 2011-02-14 ENCOUNTER — Other Ambulatory Visit: Payer: Self-pay | Admitting: *Deleted

## 2011-02-14 ENCOUNTER — Encounter (HOSPITAL_COMMUNITY): Payer: Self-pay

## 2011-02-14 ENCOUNTER — Encounter (HOSPITAL_COMMUNITY): Payer: Self-pay | Admitting: Cardiology

## 2011-02-14 ENCOUNTER — Ambulatory Visit (INDEPENDENT_AMBULATORY_CARE_PROVIDER_SITE_OTHER): Payer: Medicare HMO | Admitting: *Deleted

## 2011-02-14 DIAGNOSIS — R7989 Other specified abnormal findings of blood chemistry: Secondary | ICD-10-CM

## 2011-02-14 DIAGNOSIS — I10 Essential (primary) hypertension: Secondary | ICD-10-CM

## 2011-02-14 DIAGNOSIS — I5031 Acute diastolic (congestive) heart failure: Secondary | ICD-10-CM

## 2011-02-14 DIAGNOSIS — R079 Chest pain, unspecified: Secondary | ICD-10-CM | POA: Insufficient documentation

## 2011-02-14 MED ORDER — METOPROLOL TARTRATE 25 MG PO TABS: 25.0000 mg | ORAL_TABLET | Freq: Two times a day (BID) | ORAL | Status: DC

## 2011-02-14 MED ORDER — TECHNETIUM TC 99M TETROFOSMIN IV KIT
30.0000 | PACK | Freq: Once | INTRAVENOUS | Status: AC | PRN
  Administered 2011-02-14: 31 via INTRAVENOUS

## 2011-02-14 MED ORDER — LISINOPRIL 20 MG PO TABS: 20.0000 mg | ORAL_TABLET | Freq: Every day | ORAL | Status: DC

## 2011-02-14 MED ORDER — TECHNETIUM TC 99M TETROFOSMIN IV KIT
10.0000 | PACK | Freq: Once | INTRAVENOUS | Status: AC | PRN
  Administered 2011-02-14: 11 via INTRAVENOUS

## 2011-02-14 NOTE — Progress Notes (Signed)
Stress Lab Nurses Notes - [facility]  Laura Best 02/14/2011  Reason for doing test: CAD and Chest Pain Type of test: Stress Myoview Nurse performing test: Gerrit Halls, RN Nuclear Medicine Tech: Dyanne Carrel Echo Tech: Not Applicable MD performing test: R. Lattie Haw Family MD: [facility] Test explained and consent signed: yes IV started: 22g jelco, Saline lock flushed, No redness or edema and Saline lock started in radiology Symptoms: fatigue Treatment/Intervention: None Reason test stopped: fatigue After recovery IV was: Discontinued via X-ray tech and No redness or edema Patient to return to [facility]. Med at : 12:00 Patient discharged: Home Patient's Condition upon discharge was: stable Comments: During test peak BP 212/78 & HR 120.  Recovery BP 178/78 & HR 72.  Symptoms resolved in recovery. Geanie Cooley T

## 2011-02-16 ENCOUNTER — Telehealth: Payer: Self-pay | Admitting: Cardiology

## 2011-02-16 NOTE — Telephone Encounter (Signed)
Advised patient to continue medication for next few days and if it does not subside, call back and we will look into it further as a possible reaction.

## 2011-02-16 NOTE — Telephone Encounter (Signed)
Patient states she talked to you earlier about her "diarhea".  States you told her to call back if it kept happening.  She was calling you back to tell you it was still happening.  Told her you would call her back in the morning. / tg

## 2011-02-16 NOTE — Telephone Encounter (Signed)
Patient would like return phone call regarding new medicine.  States that it is giving her diarhhea. / tg

## 2011-02-17 ENCOUNTER — Telehealth: Payer: Self-pay | Admitting: *Deleted

## 2011-02-17 NOTE — Telephone Encounter (Signed)
Use Imodium OTC as necessary.  These symptoms are not likely to be related to her current medications.

## 2011-02-20 ENCOUNTER — Ambulatory Visit (INDEPENDENT_AMBULATORY_CARE_PROVIDER_SITE_OTHER): Payer: Medicare HMO | Admitting: Adult Health

## 2011-02-20 ENCOUNTER — Encounter: Payer: Self-pay | Admitting: Adult Health

## 2011-02-20 DIAGNOSIS — J4489 Other specified chronic obstructive pulmonary disease: Secondary | ICD-10-CM

## 2011-02-20 DIAGNOSIS — I10 Essential (primary) hypertension: Secondary | ICD-10-CM

## 2011-02-20 DIAGNOSIS — I5031 Acute diastolic (congestive) heart failure: Secondary | ICD-10-CM

## 2011-02-20 DIAGNOSIS — J449 Chronic obstructive pulmonary disease, unspecified: Secondary | ICD-10-CM

## 2011-02-20 NOTE — Patient Instructions (Signed)
Your physician recommends that you schedule a follow-up appointment in:  3 months with provider 3 days with nurse for Blood pressure check  Your physician has recommended you make the following change in your medication:  1 - STOP Metoprolol 2 - Continue Lisinopril

## 2011-02-20 NOTE — Assessment & Plan Note (Signed)
Mildly elevated on this visit, but she has not taken the lisinopril or the metoprolol as discussed. Will recheck in a few days on the lisinopril.

## 2011-02-20 NOTE — Assessment & Plan Note (Signed)
Breathing status is stable. No excessive DOE.

## 2011-02-20 NOTE — Assessment & Plan Note (Signed)
She appears well compensated at this time without evidence of fluid overload. I have taken her off of the metoprolol but asked her to continue the lisinopril.  She will come back in a few days for a nurse check for re-evaluation of BP. She is also to provide a stool sample if she has recurrent diarrhea to evaluate for C-Diff.

## 2011-02-20 NOTE — Progress Notes (Signed)
   HPI: Mrs. Steg is a 71 y/o patient of Dr.Rothbart we are following after recent hospitalization for initial diagnosis of diastolic CHF, with history of hypertension, diabetes, hyperlipidemia and mild anemia. She was diuresed and placed on lisinopril and metoprolol along with amlodipine. She had a stress test post hospitalization on 02/15/2011 which was found to be negative for ischemia, with normal myocardial perfusion. She has complaints today of persistent diarrhea after starting the new medications. She believes it to be the metoprolol, as the others did not bother her before starting it. She stopped taking the lisinopril and metoprolol for the last week, and the diarrhea stopped. 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  Allergies  Allergen Reactions  . Aspirin   . Metformin And Related Diarrhea    Current Outpatient Prescriptions  Medication Sig Dispense Refill  . amLODipine (NORVASC) 5 MG tablet Take 2 tablets (10 mg total) by mouth daily.  30 tablet  1  . calcium citrate-vitamin D (CITRACAL+D) 315-200 MG-UNIT per tablet Take 1 tablet by mouth 2 (two) times daily.        . Cholecalciferol (VITAMIN D) 2000 UNITS CAPS Take 1 capsule by mouth 1 dose over 46 hours.        . furosemide (LASIX) 40 MG tablet Take 1 tablet (40 mg total) by mouth daily.  30 tablet  1  . insulin glargine (LANTUS) 100 UNIT/ML injection Inject 15 Units into the skin daily. SOLOSTAR PEN  3 mL  12  . lisinopril (PRINIVIL,ZESTRIL) 20 MG tablet Take 1 tablet (20 mg total) by mouth daily.  90 tablet  3    Past Medical History  Diagnosis Date  . Diabetes mellitus   . Hypertension   . Hyperlipidemia   . Tricuspid regurgitation     Echo 05/02/06-Nml LV. Mild MR. Mod TR  . Osteoporosis     Past Surgical History  Procedure Date  . Heart chamber revision     patched hole --1993  . Tubal ligation     ROS:.Review of systems complete and found to be  negative unless listed above PHYSICAL EXAM BP 168/82  Pulse 78  Ht 5\' 7"  (1.702 m)  Wt 133 lb (60.328 kg)  BMI 20.83 kg/m2  General: Well developed, well nourished, in no acute distress Head: Eyes PERRLA, No xanthomas.   Normal cephalic and atramatic  Lungs: Clear bilaterally to auscultation and percussion. Heart: HRRR S1 S2, with 1/6  Systolic murmur..  Pulses are 2+ & equal.            No carotid bruit. No JVD.  No abdominal bruits. No femoral bruits. Abdomen: Bowel sounds are positive, abdomen soft and non-tender without masses or                  Hernia's noted. Msk:  Mild kyphosis normal, slow gait. Normal strength and tone for age. Extremities: No clubbing, cyanosis or edema.  DP +1 Neuro: Alert and oriented X 3. Psych:  Good affect, responds appropriately    ASSESSMENT AND PLAN

## 2011-02-24 ENCOUNTER — Ambulatory Visit (INDEPENDENT_AMBULATORY_CARE_PROVIDER_SITE_OTHER): Payer: Medicare HMO | Admitting: *Deleted

## 2011-02-24 ENCOUNTER — Encounter: Payer: Self-pay | Admitting: *Deleted

## 2011-02-24 VITALS — BP 188/68 | HR 66

## 2011-02-24 DIAGNOSIS — I10 Essential (primary) hypertension: Secondary | ICD-10-CM

## 2011-02-24 NOTE — Progress Notes (Signed)
Patient presents today for follow up blood pressure check after discontinuing metoprolol due diarrhea since initiation.  Pt states diarrhea has subsided, however remains hypertensive with associated headache.  Advised her that I would contact her on Monday with further instructions.

## 2011-02-27 ENCOUNTER — Telehealth: Payer: Self-pay | Admitting: *Deleted

## 2011-02-27 ENCOUNTER — Other Ambulatory Visit: Payer: Self-pay | Admitting: *Deleted

## 2011-02-27 MED ORDER — LISINOPRIL 20 MG PO TABS: 20.0000 mg | ORAL_TABLET | Freq: Two times a day (BID) | ORAL | Status: DC

## 2011-02-27 NOTE — Telephone Encounter (Signed)
Advised to increase lisinopril to twice a day.  Already scheduled for BMET on Monday Feb 11th.

## 2011-02-27 NOTE — Progress Notes (Signed)
Increase lisinopril to 20 mg BID. Have BMET checked in one week.  Jory Sims NP

## 2011-03-06 ENCOUNTER — Other Ambulatory Visit: Payer: Self-pay | Admitting: Cardiology

## 2011-03-07 LAB — BASIC METABOLIC PANEL
Chloride: 101 mEq/L (ref 96–112)
Potassium: 4.5 mEq/L (ref 3.5–5.3)

## 2011-03-09 ENCOUNTER — Telehealth: Payer: Self-pay

## 2011-03-09 ENCOUNTER — Other Ambulatory Visit: Payer: Self-pay

## 2011-03-09 NOTE — Telephone Encounter (Signed)
PT NEEDS TEST RESULTS WHEN YOU CALL HER BACK PLEASE/TMJ

## 2011-03-09 NOTE — Telephone Encounter (Signed)
**Note De-Identified Harveer Sadler Obfuscation** Message copied by Dennie Fetters on Thu Mar 09, 2011 12:04 PM ------      Message from: Yehuda Savannah      Created: Wed Mar 08, 2011 11:43 AM       Elevated FBS-PCP should reevaluate medication for diabetes at next office visit.      Mild deterioration in renal function.  Decrease lisinopril to 10 mg per day.  Labetalol 200 mg b.i.d.  Clonidine TTS level II if she can afford.  Decrease furosemide to 20 mg per day.  Weight daily and call for a 4 pound increase in weight.      Blood pressure check in 2 weeks.      Return office visit with me in one month.      CMet in 3 weeks

## 2011-03-09 NOTE — Telephone Encounter (Signed)
**Note De-Identified Glenford Garis Obfuscation** Pt's pharmacy, [facility], states that a months supply of Clonidine TTS 0.1 mg patch costs $177.49, a month supply of the 0.2 mg patch costs $214.78 and a month supply of 0.1 mg tablets cost $4.00. Please advise./LV

## 2011-03-10 ENCOUNTER — Encounter: Payer: Self-pay | Admitting: *Deleted

## 2011-03-10 ENCOUNTER — Other Ambulatory Visit: Payer: Self-pay | Admitting: *Deleted

## 2011-03-10 DIAGNOSIS — I10 Essential (primary) hypertension: Secondary | ICD-10-CM

## 2011-03-10 MED ORDER — LABETALOL HCL 200 MG PO TABS: 200.0000 mg | ORAL_TABLET | Freq: Two times a day (BID) | ORAL | Status: DC

## 2011-03-10 MED ORDER — CLONIDINE HCL 0.2 MG PO TABS: 0.2000 mg | ORAL_TABLET | Freq: Two times a day (BID) | ORAL | Status: DC

## 2011-03-10 MED ORDER — FUROSEMIDE 20 MG PO TABS: 20.0000 mg | ORAL_TABLET | Freq: Every day | ORAL | Status: DC

## 2011-03-10 MED ORDER — LISINOPRIL 10 MG PO TABS: 10.0000 mg | ORAL_TABLET | Freq: Every day | ORAL | Status: DC

## 2011-03-10 NOTE — Telephone Encounter (Signed)
I received another message that clonidine 0.2 mg BID ordered.  That's fine if already done, but patient has had numerous adverse effects with meds and will likely not tolerate that dose.  Can start 0.1 mg BID if she has not already purchased medication.

## 2011-03-10 NOTE — Telephone Encounter (Signed)
Patient is requesting a letter to return to work, as she has been out since discharge from hospital.  Please advise.

## 2011-03-10 NOTE — Telephone Encounter (Signed)
OK to provide letter.

## 2011-03-13 ENCOUNTER — Encounter: Payer: Self-pay | Admitting: *Deleted

## 2011-03-13 ENCOUNTER — Telehealth: Payer: Self-pay | Admitting: *Deleted

## 2011-03-13 ENCOUNTER — Other Ambulatory Visit: Payer: Self-pay | Admitting: *Deleted

## 2011-03-13 MED ORDER — AMLODIPINE BESYLATE 10 MG PO TABS: 10.0000 mg | ORAL_TABLET | Freq: Every day | ORAL | Status: DC

## 2011-03-13 NOTE — Telephone Encounter (Signed)
Patient had filled clonidine 0.2 mg, therefore advised her to take 1/2 tablet twice a day at the recommendation of Dr Lattie Haw.  Verbalized understanding.

## 2011-03-13 NOTE — Telephone Encounter (Signed)
Patient informed that Dr Lattie Haw has approved for her to return to work.  A letter will be created for patient to pick up.

## 2011-03-14 ENCOUNTER — Telehealth: Payer: Self-pay | Admitting: Cardiology

## 2011-03-14 NOTE — Telephone Encounter (Signed)
Tell her it is expected that she will feel worse initially with new BP medications. Especially when she had been running higher BP and is now normal.  She needs to come in for a BP check! Cannot treat her over the phone without accurate BP recording to ascertain effectiveness of medications.

## 2011-03-14 NOTE — Telephone Encounter (Signed)
Spoke to patient regarding medication.  States she is feeling "woozy" since increasing Lisinopril.  States that she doesn't feel she can go back to work feeling like she does.  Offered a nurse visit to evaluate orthostatic BP, however pt states she may be unable to make it this week.  Does not have a blood pressure cuff at home.

## 2011-03-14 NOTE — Telephone Encounter (Signed)
Patient would like return phone call regarding side effects of new meds. / tg

## 2011-03-15 ENCOUNTER — Ambulatory Visit: Payer: Medicare HMO | Admitting: Adult Health

## 2011-03-15 ENCOUNTER — Other Ambulatory Visit: Payer: Self-pay | Admitting: *Deleted

## 2011-03-15 ENCOUNTER — Encounter: Payer: Self-pay | Admitting: *Deleted

## 2011-03-15 DIAGNOSIS — I10 Essential (primary) hypertension: Secondary | ICD-10-CM

## 2011-03-23 ENCOUNTER — Telehealth: Payer: Self-pay | Admitting: *Deleted

## 2011-03-23 DIAGNOSIS — I10 Essential (primary) hypertension: Secondary | ICD-10-CM

## 2011-03-29 LAB — COMPREHENSIVE METABOLIC PANEL
Albumin: 4 g/dL (ref 3.5–5.2)
BUN: 28 mg/dL — ABNORMAL HIGH (ref 6–23)
CO2: 25 mEq/L (ref 19–32)
Calcium: 9.2 mg/dL (ref 8.4–10.5)
Chloride: 102 mEq/L (ref 96–112)
Glucose, Bld: 154 mg/dL — ABNORMAL HIGH (ref 70–99)
Potassium: 4.4 mEq/L (ref 3.5–5.3)

## 2011-03-31 ENCOUNTER — Ambulatory Visit (INDEPENDENT_AMBULATORY_CARE_PROVIDER_SITE_OTHER): Payer: Medicare HMO | Admitting: *Deleted

## 2011-03-31 ENCOUNTER — Encounter: Payer: Self-pay | Admitting: *Deleted

## 2011-03-31 VITALS — BP 172/76 | HR 65

## 2011-03-31 DIAGNOSIS — I10 Essential (primary) hypertension: Secondary | ICD-10-CM

## 2011-04-03 NOTE — Progress Notes (Signed)
Presents for follow up blood pressure check.  No acute distress noted, however continues to have sub optimal blood pressures despite taking medications as prescribed.

## 2011-04-14 ENCOUNTER — Ambulatory Visit: Payer: Medicare HMO | Admitting: Cardiology

## 2011-04-17 NOTE — Progress Notes (Signed)
Patient ID: ROBIE ECCLESTON, female   DOB: December 04, 1940, 71 y.o.   MRN: [id number]  Schedule appointment with me.

## 2011-06-12 ENCOUNTER — Other Ambulatory Visit: Payer: Self-pay | Admitting: Cardiology

## 2011-06-12 MED ORDER — FUROSEMIDE 20 MG PO TABS: 20.0000 mg | ORAL_TABLET | Freq: Every day | ORAL | Status: DC

## 2011-06-12 NOTE — Telephone Encounter (Signed)
Addended by: Suzanne Boron A on: 06/12/2011 10:55 AM   Modules accepted: Orders

## 2011-06-26 ENCOUNTER — Telehealth: Payer: Self-pay | Admitting: Cardiology

## 2011-06-26 NOTE — Telephone Encounter (Signed)
Patient states having an upset stomach.  Thinks it is from her meds.  No new changes have been made to associate this with her ongoing medication regimen.  Advised her to consult with her PCP, otherwise this can be addressed at her follow up visit on 6/14.

## 2011-06-26 NOTE — Telephone Encounter (Signed)
Patient states that her meds are "messing with her stomach"./ tg

## 2011-07-07 ENCOUNTER — Ambulatory Visit: Payer: Medicare HMO | Admitting: Cardiology

## 2011-07-14 ENCOUNTER — Ambulatory Visit: Payer: Medicare HMO | Admitting: Adult Health

## 2011-09-08 ENCOUNTER — Encounter (HOSPITAL_COMMUNITY): Payer: Self-pay | Admitting: Emergency Medicine

## 2011-09-08 ENCOUNTER — Emergency Department (HOSPITAL_COMMUNITY)
Admission: EM | Admit: 2011-09-08 | Discharge: 2011-09-08 | Disposition: A | Discharge status: Home / Self Care | Payer: Medicare HMO | Attending: Emergency Medicine | Admitting: Emergency Medicine

## 2011-09-08 ENCOUNTER — Emergency Department (HOSPITAL_COMMUNITY): Payer: Medicare HMO

## 2011-09-08 DIAGNOSIS — R197 Diarrhea, unspecified: Secondary | ICD-10-CM | POA: Insufficient documentation

## 2011-09-08 DIAGNOSIS — Z79899 Other long term (current) drug therapy: Secondary | ICD-10-CM | POA: Insufficient documentation

## 2011-09-08 DIAGNOSIS — R0602 Shortness of breath: Secondary | ICD-10-CM | POA: Insufficient documentation

## 2011-09-08 DIAGNOSIS — M810 Age-related osteoporosis without current pathological fracture: Secondary | ICD-10-CM | POA: Insufficient documentation

## 2011-09-08 DIAGNOSIS — E785 Hyperlipidemia, unspecified: Secondary | ICD-10-CM | POA: Insufficient documentation

## 2011-09-08 DIAGNOSIS — I079 Rheumatic tricuspid valve disease, unspecified: Secondary | ICD-10-CM | POA: Insufficient documentation

## 2011-09-08 DIAGNOSIS — Z794 Long term (current) use of insulin: Secondary | ICD-10-CM | POA: Insufficient documentation

## 2011-09-08 DIAGNOSIS — R110 Nausea: Secondary | ICD-10-CM | POA: Insufficient documentation

## 2011-09-08 DIAGNOSIS — E119 Type 2 diabetes mellitus without complications: Secondary | ICD-10-CM | POA: Insufficient documentation

## 2011-09-08 DIAGNOSIS — R002 Palpitations: Secondary | ICD-10-CM | POA: Insufficient documentation

## 2011-09-08 DIAGNOSIS — I10 Essential (primary) hypertension: Secondary | ICD-10-CM | POA: Insufficient documentation

## 2011-09-08 LAB — CBC
HCT: 36.2 % (ref 36.0–46.0)
Hemoglobin: 12.4 g/dL (ref 12.0–15.0)
MCH: 28.7 pg (ref 26.0–34.0)
MCHC: 34.3 g/dL (ref 30.0–36.0)
MCV: 83.8 fL (ref 78.0–100.0)
Platelets: 189 10*3/uL (ref 150–400)
RBC: 4.32 MIL/uL (ref 3.87–5.11)
RDW: 13.2 % (ref 11.5–15.5)
WBC: 6 10*3/uL (ref 4.0–10.5)

## 2011-09-08 LAB — BASIC METABOLIC PANEL
BUN: 11 mg/dL (ref 6–23)
CO2: 23 mEq/L (ref 19–32)
Calcium: 9.4 mg/dL (ref 8.4–10.5)
Chloride: 105 mEq/L (ref 96–112)
Creatinine, Ser: 1.13 mg/dL — ABNORMAL HIGH (ref 0.50–1.10)
GFR calc Af Amer: 55 mL/min — ABNORMAL LOW (ref >90)
GFR calc non Af Amer: 48 mL/min — ABNORMAL LOW (ref >90)
Glucose, Bld: 119 mg/dL — ABNORMAL HIGH (ref 70–99)
Potassium: 3.4 mEq/L — ABNORMAL LOW (ref 3.5–5.1)
Sodium: 141 mEq/L (ref 135–145)

## 2011-09-08 LAB — TROPONIN I: Troponin I: <0.3 ng/mL (ref <0.30)

## 2011-09-08 NOTE — ED Notes (Signed)
Pt c/o pain under her left breast, nausea and diarrhea that started this am.

## 2011-09-08 NOTE — ED Notes (Signed)
Pt reports onset of nausea, diarrhea and chest pain this morning; denies chest pain at present. A&ox4; in no apparent distress; pt is on cardiac monitor with family at bedside.

## 2011-09-08 NOTE — ED Provider Notes (Signed)
History    71yf with palpitations. Onset this morning. Triage note reviewed but pt specifically denies pain. Says can feels heart beating under left breast and feels like skipping beats. No appreciable exacerbating or relieving factors.  Mild nausea. No vomiting. Diarrhea. No blood or melena. No unusual leg pain or swelling. No urinary complaints. Denies new meds or med changes.  CSN: [id number]  Arrival date & time 09/08/11  68   First MD Initiated Contact with Patient 09/08/11 1336      Chief Complaint  Patient presents with  . Chest Pain  . Shortness of Breath  . Diarrhea  . Nausea    (Consider location/radiation/quality/duration/timing/severity/associated sxs/prior treatment) HPI  Past Medical History  Diagnosis Date  . Diabetes mellitus   . Hypertension   . Hyperlipidemia   . Tricuspid regurgitation     Echo 05/02/06-Nml LV. Mild MR. Mod TR  . Osteoporosis     Past Surgical History  Procedure Date  . Heart chamber revision     patched hole --1993  . Tubal ligation     Family History  Problem Relation Age of Onset  . Cancer Father   . Diabetes Father   . Cancer Brother     Brain  . Cancer Sister     abdominal fat    History  Substance Use Topics  . Smoking status: Former Research scientist (life sciences)  . Smokeless tobacco: Not on file  . Alcohol Use: No    OB History    Grav Para Term Preterm Abortions TAB SAB Ect Mult Living                  Review of Systems   Review of symptoms negative unless otherwise noted in HPI.   Allergies  Aspirin and Metformin and related  Home Medications   Current Outpatient Rx  Name Route Sig Dispense Refill  . AMLODIPINE BESYLATE 10 MG PO TABS Oral Take 1 tablet (10 mg total) by mouth daily. 90 tablet 3    Please make patient aware that she will not longer ...  . CALCIUM CITRATE-VITAMIN D 315-200 MG-UNIT PO TABS Oral Take 1 tablet by mouth 2 (two) times daily.      Marland Kitchen VITAMIN D 2000 UNITS PO CAPS Oral Take 1 capsule by mouth 1  dose over 46 hours.      Marland Kitchen CLONIDINE HCL 0.2 MG PO TABS Oral Take 1 tablet (0.2 mg total) by mouth 2 (two) times daily. 60 tablet 13  . FUROSEMIDE 20 MG PO TABS Oral Take 1 tablet (20 mg total) by mouth daily. 30 tablet 2    Patient needs an office follow up.  Appointment ma ...  . INSULIN GLARGINE 100 UNIT/ML [place] SOLN Subcutaneous Inject 15 Units into the skin daily. SOLOSTAR PEN 3 mL 12  . LABETALOL HCL 200 MG PO TABS Oral Take 1 tablet (200 mg total) by mouth 2 (two) times daily. 60 tablet 12  . LISINOPRIL 10 MG PO TABS Oral Take 1 tablet (10 mg total) by mouth daily. 30 tablet 12    BP 229/90  Pulse 78  Temp 98.1 F (36.7 C) (Oral)  Resp 20  Ht 5\' 7"  (1.702 m)  Wt 140 lb (63.504 kg)  BMI 21.93 kg/m2  SpO2 98%  Physical Exam  Nursing note and vitals reviewed. Constitutional: She appears well-developed and well-nourished. No distress.       Laying in bed. NAD.  HENT:  Head: Normocephalic and atraumatic.  Eyes: Conjunctivae are normal. Right  eye exhibits no discharge. Left eye exhibits no discharge.  Neck: Neck supple.  Cardiovascular: Normal rate, regular rhythm and normal heart sounds.  Exam reveals no gallop and no friction rub.   No murmur heard. Pulmonary/Chest: Effort normal and breath sounds normal. No respiratory distress.  Abdominal: Soft. She exhibits no distension. There is no tenderness.  Musculoskeletal: She exhibits no edema and no tenderness.       Lower extremities symmetric as compared to each other. No calf tenderness. Negative Homan's. No palpable cords.   Neurological: She is alert.  Skin: Skin is warm and dry.  Psychiatric: She has a normal mood and affect. Her behavior is normal. Thought content normal.    ED Course  Procedures (including critical care time)  Labs Reviewed  BASIC METABOLIC PANEL - Abnormal; Notable for the following:    Potassium 3.4 (*)     Glucose, Bld 119 (*)     Creatinine, Ser 1.13 (*)     GFR calc non Af Amer 48 (*)     GFR  calc Af Amer 55 (*)     All other components within normal limits  CBC  TROPONIN I  LAB REPORT - SCANNED   No results found.  Dg Chest 2 View  09/08/2011  *RADIOLOGY REPORT*  Clinical Data: Chest pain and dyspnea  CHEST - 2 VIEW  Comparison: 02/04/2011  Findings: Mild cardiomegaly.  Hyperaeration.  Prominent central pulmonary arteries are stable.  No pneumothorax or pleural effusion.  Increased AP diameter of the chest.  No focal consolidation or mass.  No acute bony deformity.  Postoperative changes are noted.  IMPRESSION: Cardiomegaly and prominent central pulmonary arteries without evidence of decompensation.  Changes related to COPD are noted.  No active disease.  Original Report Authenticated By: Jamas Lav, M.D.   EKG:  Rhythm: sinus with PVC Rate: 72 Axis: left Intervals: LVH. QRS 115ms. ST segments: NS ST changes. Comparison: Little change from prior from 02/02/11  1. Heart palpitations       MDM  71yF with palpitations. Consider dysrhythmia, PE, thyroid, tox, anxiety. No significant ectopy noted on monitor or during auscultation. W/u unremarkable. Doesn't appear anxious on exam. Consider ACS, but doubt. Atypical symptoms. EKG stable from previous. Trop normal. Feel pt safe for DC at this time. Return precautions discussed. Outpt fu.       Virgel Manifold, MD 09/15/11 [id number]

## 2012-02-27 ENCOUNTER — Encounter: Payer: Self-pay | Admitting: Adult Health

## 2012-03-13 ENCOUNTER — Encounter: Payer: Self-pay | Admitting: Adult Health

## 2012-03-13 ENCOUNTER — Ambulatory Visit (INDEPENDENT_AMBULATORY_CARE_PROVIDER_SITE_OTHER): Payer: Medicare Other | Admitting: Adult Health

## 2012-03-13 ENCOUNTER — Encounter: Payer: Self-pay | Admitting: *Deleted

## 2012-03-13 VITALS — BP 220/80 | HR 74 | Ht 67.0 in | Wt <= 1120 oz

## 2012-03-13 DIAGNOSIS — IMO0001 Reserved for inherently not codable concepts without codable children: Secondary | ICD-10-CM

## 2012-03-13 DIAGNOSIS — E785 Hyperlipidemia, unspecified: Secondary | ICD-10-CM

## 2012-03-13 DIAGNOSIS — I10 Essential (primary) hypertension: Secondary | ICD-10-CM

## 2012-03-13 DIAGNOSIS — I5031 Acute diastolic (congestive) heart failure: Secondary | ICD-10-CM

## 2012-03-13 DIAGNOSIS — I2789 Other specified pulmonary heart diseases: Secondary | ICD-10-CM

## 2012-03-13 DIAGNOSIS — I272 Pulmonary hypertension, unspecified: Secondary | ICD-10-CM

## 2012-03-13 MED ORDER — LISINOPRIL 20 MG PO TABS: 20.0000 mg | ORAL_TABLET | Freq: Every day | ORAL | Status: DC

## 2012-03-13 NOTE — Assessment & Plan Note (Signed)
I have explained the dangers of not taking medications as directed in the setting of elevated BP. She is willing to try taking one medication at a time to see if this aggravates diarrhea. In the setting of diabetes, I will place her back on lisinopril 20 mg BID with follow up BP check in one week and BMET in a week, with follow up appointment with me in one month. She has new insurance and requests that the Rx be mailed so that it is delivered to her. She will need to take lisinopril dose she has at home until new medications are delivered.

## 2012-03-13 NOTE — Progress Notes (Deleted)
Name: Laura Best    DOB: 1940-06-08  Age: 72 y.o.  MR#: [id number]       PCP:  [name], PA-C      Insurance: Payor: BLUE [employer] [place] [employer]  Plan: BLUE MEDICARE  Product Type: *No Product type*    CC:   No chief complaint on file.  PATIENT STATES THAT SHE HAS STOPPED ALL MEDICATIONS AND HAS NOT HAD THEM FOR ABOUT 2 MONTHS.  STATES SHE FEELS BETTER THAN SHE HAS IN A LONG TIME.  ADVISED TO FOLLOW UP WITH PCP ASAP REGARDING STOPPING INSULIN.  VS Filed Vitals:   03/13/12 1304  BP: 220/80  Pulse: 74  Height: 5\' 7"  (1.702 m)  Weight: 13 lb (5.897 kg)  SpO2: 96%    Weights Current Weight  03/13/12 13 lb (5.897 kg)  09/08/11 140 lb (63.504 kg)  02/20/11 133 lb (60.328 kg)    Blood Pressure  BP Readings from Last 3 Encounters:  03/13/12 220/80  09/08/11 229/90  04/03/11 172/76     Admit date:  (Not on file) Last encounter with RMR:  Visit date not found   Allergy Aspirin and Metformin and related  Current Outpatient Prescriptions  Medication Sig Dispense Refill  . insulin glargine (LANTUS SOLOSTAR) 100 UNIT/ML injection Inject 15 Units into the skin every morning.       No current facility-administered medications for this visit.    Discontinued Meds:    Medications Discontinued During This Encounter  Medication Reason  . acetaminophen (TYLENOL) 500 MG tablet Patient has not taken in last 30 days    Patient Active Problem List  Diagnosis  . Hypertension  . Hyperlipidemia  . Hypertensive urgency  . Diabetes type 2, uncontrolled  . Pulmonary hypertension  . COPD (chronic obstructive pulmonary disease)  . Hypokalemia  . Normocytic anemia  . Acute diastolic heart failure    LABS @BMET3 @  @CMPRESULT3 @ @CBC3 @  Lipid Panel     Component Value Date/Time   CHOL 177 02/04/2011 0655   TRIG 188* 02/04/2011 0655   HDL 57 02/04/2011 0655   CHOLHDL 3.1 02/04/2011 0655   VLDL 38 02/04/2011 0655   LDLCALC 82 02/04/2011 0655    ABG    Component  Value Date/Time   TCO2 22 12/24/2008 2235     BNP (last 3 results) No results found for this basename: PROBNP,  in the last 8760 hours Cardiac Panel (last 3 results) No results found for this basename: CKTOTAL, CKMB, TROPONINI, RELINDX,  in the last 72 hours  Iron/TIBC/Ferritin    Component Value Date/Time   IRON 33* 02/02/2011 2233   TIBC 211* 02/02/2011 2233   FERRITIN 306* 02/02/2011 2233     EKG Orders placed in visit on 03/13/12  . EKG 12-LEAD     Prior Assessment and Plan Problem List as of 03/13/2012     ICD-9-CM     Cardiology Problems   Hypertension   Last Assessment & Plan   02/20/2011 Office Visit Written 02/20/2011 12:28 PM by Lendon Colonel, NP     Mildly elevated on this visit, but she has not taken the lisinopril or the metoprolol as discussed. Will recheck in a few days on the lisinopril.    Hyperlipidemia   Hypertensive urgency   Pulmonary hypertension   Acute diastolic heart failure   Last Assessment & Plan   02/20/2011 Office Visit Written 02/20/2011 12:26 PM by Lendon Colonel, NP     She appears well compensated  at this time without evidence of fluid overload. I have taken her off of the metoprolol but asked her to continue the lisinopril.  She will come back in a few days for a nurse check for re-evaluation of BP. She is also to provide a stool sample if she has recurrent diarrhea to evaluate for C-Diff.      Other   COPD (chronic obstructive pulmonary disease)   Last Assessment & Plan   02/20/2011 Office Visit Written 02/20/2011 12:27 PM by Lendon Colonel, NP     Breathing status is stable. No excessive DOE.    Diabetes type 2, uncontrolled   Hypokalemia   Normocytic anemia       Imaging: No results found.

## 2012-03-13 NOTE — Progress Notes (Signed)
   HPI: [name] is 72 y/o patient of Dr. Lattie Haw we are seeing for ongoing assessment of hypertension, diastolic CHF, and hyperlipidemia, with known history of diabetes, anemia, and COPD. She comes today after stopping her medications for > 2 months because she was having excessive diarrhea. She related this to her medications.She also stopped taking insulin. She comes today stating that she feels much better after stopping her medications. She has been seen by GI who advised her to stop caffeine and took her off PPI. She is asymptomatic but hypertensive.  Allergies  Allergen Reactions  . Aspirin   . Metformin And Related Diarrhea    Current Outpatient Prescriptions  Medication Sig Dispense Refill  . insulin glargine (LANTUS SOLOSTAR) 100 UNIT/ML injection Inject 15 Units into the skin every morning.      Marland Kitchen lisinopril (PRINIVIL,ZESTRIL) 20 MG tablet Take 1 tablet (20 mg total) by mouth daily.  90 tablet  3   No current facility-administered medications for this visit.    Past Medical History  Diagnosis Date  . Diabetes mellitus   . Hypertension   . Hyperlipidemia   . Tricuspid regurgitation     Echo 05/02/06-Nml LV. Mild MR. Mod TR  . Osteoporosis     Past Surgical History  Procedure Laterality Date  . Heart chamber revision      patched hole --1993  . Tubal ligation      [id number] of systems complete and found to be negative unless listed above  PHYSICAL EXAM BP 220/80  Pulse 74  Ht 5\' 7"  (1.702 m)  Wt 13 lb (5.897 kg)  BMI 2.04 kg/m2  SpO2 96%  General: Well developed, well nourished, in no acute distress Head: Eyes PERRLA, No xanthomas.   Normal cephalic and atramatic  Lungs: Clear bilaterally to auscultation and percussion. Heart: HRRR S1 S2, with soft S4 murmur..  Pulses are 2+ & equal.            No carotid bruit. No JVD.  No abdominal bruits. No femoral bruits. Abdomen: Bowel sounds are positive, abdomen soft and non-tender without masses or    Hernia's noted. Msk:  Back normal, normal gait. Normal strength and tone for age. Extremities: No clubbing, cyanosis or edema.  DP +1 Neuro: Alert and oriented X 3. Psych:  Good affect, responds appropriately  EKG: NSR rate of 75 bpm. LAE, LVH is noted.  ASSESSMENT AND PLAN

## 2012-03-13 NOTE — Assessment & Plan Note (Signed)
She is advised to see PCP for reinstitution of insulin. She verbalizes understanding.

## 2012-03-13 NOTE — Assessment & Plan Note (Signed)
No evidence of fluid overload at this time.

## 2012-03-13 NOTE — Assessment & Plan Note (Signed)
She is not on a statin at this time. She refuses statin therapy at this time, but is willing to try this again if the other medications do not cause stomach upset. Will restart medications a little at a time. She needs coaxing to be compliant.

## 2012-03-13 NOTE — Patient Instructions (Addendum)
Your physician recommends that you schedule a follow-up appointment in: 1 month  Your physician has recommended you make the following change in your medication:  1 - START Lisinopril 20 mg twice a day  Call us once you have started your medication to schedule a blood pressure check  Your physician recommends that you return for lab work in: 1 month, just prior to your visit

## 2012-04-01 ENCOUNTER — Other Ambulatory Visit: Payer: Self-pay | Admitting: Cardiology

## 2012-04-02 LAB — COMPREHENSIVE METABOLIC PANEL
ALT: 13 U/L (ref 0–35)
AST: 13 U/L (ref 0–37)
Albumin: 3.7 g/dL (ref 3.5–5.2)
Alkaline Phosphatase: 103 U/L (ref 39–117)
Potassium: 4.4 mEq/L (ref 3.5–5.3)
Sodium: 140 mEq/L (ref 135–145)
Total Bilirubin: 0.3 mg/dL (ref 0.3–1.2)
Total Protein: 7.1 g/dL (ref 6.0–8.3)

## 2012-04-05 ENCOUNTER — Other Ambulatory Visit: Payer: Self-pay | Admitting: *Deleted

## 2012-04-05 DIAGNOSIS — I272 Pulmonary hypertension, unspecified: Secondary | ICD-10-CM

## 2012-04-10 ENCOUNTER — Telehealth: Payer: Self-pay | Admitting: *Deleted

## 2012-04-10 ENCOUNTER — Ambulatory Visit: Payer: Medicare Other | Admitting: Adult Health

## 2012-04-10 NOTE — Telephone Encounter (Signed)
Patient's lab work did not cross over to [facility], however was pulled from the [facility] and sent to Jory Sims, NP for review.  Patient made aware of normal values.

## 2012-04-11 ENCOUNTER — Encounter: Payer: Self-pay | Admitting: Adult Health

## 2012-04-18 ENCOUNTER — Ambulatory Visit: Payer: Medicare Other | Admitting: Adult Health

## 2012-04-19 ENCOUNTER — Encounter: Payer: Self-pay | Admitting: Cardiology

## 2012-04-24 ENCOUNTER — Ambulatory Visit: Payer: Medicare Other | Admitting: Adult Health

## 2012-04-25 ENCOUNTER — Ambulatory Visit (INDEPENDENT_AMBULATORY_CARE_PROVIDER_SITE_OTHER): Payer: Medicare Other | Admitting: Adult Health

## 2012-04-25 ENCOUNTER — Encounter: Payer: Self-pay | Admitting: Adult Health

## 2012-04-25 VITALS — BP 200/100 | HR 62 | Ht 67.0 in | Wt 139.1 lb

## 2012-04-25 DIAGNOSIS — I10 Essential (primary) hypertension: Secondary | ICD-10-CM

## 2012-04-25 DIAGNOSIS — I5031 Acute diastolic (congestive) heart failure: Secondary | ICD-10-CM

## 2012-04-25 MED ORDER — CLONIDINE HCL 0.1 MG/24HR TD PTWK: 1.0000 | MEDICATED_PATCH | TRANSDERMAL | Status: DC

## 2012-04-25 MED ORDER — LISINOPRIL 20 MG PO TABS: 20.0000 mg | ORAL_TABLET | Freq: Two times a day (BID) | ORAL | Status: DC

## 2012-04-25 NOTE — Patient Instructions (Addendum)
Your physician recommends that you schedule a follow-up appointment in: [facility] 2 WEEKS FOR A NURSE VISIT TO RE-CHECK BLOOD PRESSURE  Your physician has recommended you make the following change in your medication:   1) START CLONIDINE TTS PATCH (PLACE ONE PATCH ON FOR SEVEN DAYS THEN CHANGE) 2) TAKE LISINOPRIL 20MG  TWICE DAILY   WE WILL REFER YOU TO THE [facility] PROGRAM, SOMEONE WILL CALL YOU ABOUT THE APPOINTMENT DATE AND TIME

## 2012-04-25 NOTE — Progress Notes (Signed)
   HPI this is Laura Best a [age]-year-old patient of Dr. Jacqulyn Ducking we are seeing for ongoing assessment of diastolic CHF with history of hypertension, hyperlipidemia, with other history to include diabetes, anemia, and COPD. She was last in the office in February 2014. At that time she does continue to have chronic diarrhea. She is advised to stop drinking [facility]. Her caffeine intake is also ice to be discontinued. She was mildly hypertensive on that visit. She was placed back on the lisimopril 20 mg twice a day. Her medications are mailed to her. She is hypertensive on this visit, She is only taking lisinopril once daily instead of BID.  Allergies  Allergen Reactions  . Aspirin   . Metformin And Related Diarrhea    Current Outpatient Prescriptions  Medication Sig Dispense Refill  . acetaminophen (TYLENOL) 500 MG tablet Take 500 mg by mouth every 6 (six) hours as needed for pain.      Marland Kitchen insulin glargine (LANTUS SOLOSTAR) 100 UNIT/ML injection Inject 15 Units into the skin every morning.      Marland Kitchen lisinopril (PRINIVIL,ZESTRIL) 20 MG tablet Take 1 tablet (20 mg total) by mouth daily.  90 tablet  3   No current facility-administered medications for this visit.    Past Medical History  Diagnosis Date  . Diabetes mellitus   . Hypertension   . Hyperlipidemia   . Tricuspid regurgitation     Echo 05/02/06-Nml LV. Mild MR. Mod TR  . Osteoporosis     Past Surgical History  Procedure Laterality Date  . Heart chamber revision      patched hole --1993  . Tubal ligation      [id number] of systems complete and found to be negative unless listed above PHYSICAL EXAM BP 200/100  Pulse 62  Ht 5\' 7"  (1.702 m)  Wt 139 lb 1.9 oz (63.104 kg)  BMI 21.78 kg/m2  SpO2 97% General: Well developed, well nourished, in no acute distress Head: Eyes PERRLA, No xanthomas.   Normal cephalic and atramatic  Lungs: Clear bilaterally to auscultation and percussion. Heart: HRRR S1 S2, S4 without MRG.   Pulses are 2+ & equal.            No carotid bruit. No JVD.  No abdominal bruits. No femoral bruits. Abdomen: Bowel sounds are positive, abdomen soft and non-tender without masses or                  Hernia's noted. Msk:  Back normal, normal gait. Normal strength and tone for age. Extremities: No clubbing, cyanosis or edema.  DP +1 Neuro: Alert and oriented X 3. Psych:  Good affect, responds appropriately    ASSESSMENT AND PLAN

## 2012-04-25 NOTE — Assessment & Plan Note (Signed)
We have had significant difficulties getting this patient's blood pressure well controlled some of it is noncompliance, and confusion on medication dosing. She has only been taking lisinopril 20 mg daily, and should be on a twice a day. I doubt this increased dose however will keep her blood pressure well controlled. She refuses amlodipine because she does not like it that her legs swell. I will start her on a low-dose TTS clonidine patch once a week and 0.1 mg. It is my hope that keeping a patch on her will be helpful in getting her blood pressure stable. Will consider having [facility] and come by to see her for followup concerning blood pressure and medication compliance. She has been given a second dose of lisinopril in the office today. She will come back in the office in 2 weeks for blood pressure check and a followup appointment in one to 2 months.

## 2012-04-25 NOTE — Progress Notes (Deleted)
Name: Laura Best    DOB: Jun 09, 1940  Age: 72 y.o.  MR#: [id number]       PCP:  [name], PA-C      Insurance: Payor: BLUE [employer]OSS [employer] [place] MEDIC[employer]  Plan: BLUE MEDICARE  Product Type: *No Product type*    CC:    Chief Complaint  Patient presents with  . Congestive Heart Failure    Diastolic  NOTED PT BP WAS TAKEN IN BOTH ARMS AND NOTED 200/100 BOTH TIMES   VS Filed Vitals:   04/25/12 1351  BP: 200/100  Pulse: 62  Height: 5\' 7"  (1.702 m)  Weight: 139 lb 1.9 oz (63.104 kg)  SpO2: 97%    Weights Current Weight  04/25/12 139 lb 1.9 oz (63.104 kg)  03/13/12 13 lb (5.897 kg)  09/08/11 140 lb (63.504 kg)    Blood Pressure  BP Readings from Last 3 Encounters:  04/25/12 200/100  03/13/12 220/80  09/08/11 229/90     Admit date:  (Not on file) Last encounter with RMR:  04/24/2012   Allergy Aspirin and Metformin and related  Current Outpatient Prescriptions  Medication Sig Dispense Refill  . acetaminophen (TYLENOL) 500 MG tablet Take 500 mg by mouth every 6 (six) hours as needed for pain.      Marland Kitchen insulin glargine (LANTUS SOLOSTAR) 100 UNIT/ML injection Inject 15 Units into the skin every morning.      Marland Kitchen lisinopril (PRINIVIL,ZESTRIL) 20 MG tablet Take 1 tablet (20 mg total) by mouth daily.  90 tablet  3   No current facility-administered medications for this visit.    Discontinued Meds:   There are no discontinued medications.  Patient Active Problem List  Diagnosis  . Hypertension  . Hyperlipidemia  . Hypertensive urgency  . Diabetes type 2, uncontrolled  . Pulmonary hypertension  . COPD (chronic obstructive pulmonary disease)  . Hypokalemia  . Normocytic anemia  . Acute diastolic heart failure    LABS    Component Value Date/Time   NA 140 04/01/2012 0000   NA 141 09/08/2011 1355   NA 139 03/06/2011 1130   K 4.4 04/01/2012 0000   K 3.4* 09/08/2011 1355   K 4.5 03/06/2011 1130   CL 107 04/01/2012 0000   CL 105 09/08/2011 1355   CL 101 03/06/2011 1130    CO2 23 04/01/2012 0000   CO2 23 09/08/2011 1355   CO2 25 03/06/2011 1130   GLUCOSE 63* 04/01/2012 0000   GLUCOSE 119* 09/08/2011 1355   GLUCOSE 205* 03/06/2011 1130   BUN 22 04/01/2012 0000   BUN 11 09/08/2011 1355   BUN 34* 03/06/2011 1130   CREATININE 1.21* 04/01/2012 0000   CREATININE 1.13* 09/08/2011 1355   CREATININE 1.51* 03/06/2011 1130   CREATININE 1.21* 02/06/2011 0453   CREATININE 1.19* 02/05/2011 0627   CREATININE 1.23* 03/28/2010 1704   CALCIUM 9.2 04/01/2012 0000   CALCIUM 9.4 09/08/2011 1355   CALCIUM 9.5 03/06/2011 1130   GFRNONAA 48* 09/08/2011 1355   GFRNONAA 44* 02/06/2011 0453   GFRNONAA 45* 02/05/2011 0627   GFRAA 55* 09/08/2011 1355   GFRAA 51* 02/06/2011 0453   GFRAA 52* 02/05/2011 0627   CMP     Component Value Date/Time   NA 140 04/01/2012 0000   K 4.4 04/01/2012 0000   CL 107 04/01/2012 0000   CO2 23 04/01/2012 0000   GLUCOSE 63* 04/01/2012 0000   BUN 22 04/01/2012 0000   CREATININE 1.21* 04/01/2012 0000   CREATININE 1.13* 09/08/2011 1355  CALCIUM 9.2 04/01/2012 0000   PROT 7.1 04/01/2012 0000   ALBUMIN 3.7 04/01/2012 0000   AST 13 04/01/2012 0000   ALT 13 04/01/2012 0000   ALKPHOS 103 04/01/2012 0000   BILITOT 0.3 04/01/2012 0000   GFRNONAA 48* 09/08/2011 1355   GFRAA 55* 09/08/2011 1355       Component Value Date/Time   WBC 6.0 09/08/2011 1355   WBC 6.9 02/03/2011 0455   WBC 5.8 02/02/2011 1952   HGB 12.4 09/08/2011 1355   HGB 10.4* 02/03/2011 0455   HGB 10.5* 02/02/2011 1952   HCT 36.2 09/08/2011 1355   HCT 30.9* 02/03/2011 0455   HCT 31.4* 02/02/2011 1952   MCV 83.8 09/08/2011 1355   MCV 85.6 02/03/2011 0455   MCV 85.1 02/02/2011 1952    Lipid Panel     Component Value Date/Time   CHOL 177 02/04/2011 0655   TRIG 188* 02/04/2011 0655   HDL 57 02/04/2011 0655   CHOLHDL 3.1 02/04/2011 0655   VLDL 38 02/04/2011 0655   LDLCALC 82 02/04/2011 0655    ABG    Component Value Date/Time   TCO2 22 12/24/2008 2235     Lab Results  Component Value Date   TSH 2.117 02/02/2011    BNP (last 3 results) No results found for this basename: PROBNP,  in the last 8760 hours Cardiac Panel (last 3 results) No results found for this basename: CKTOTAL, CKMB, TROPONINI, RELINDX,  in the last 72 hours  Iron/TIBC/Ferritin    Component Value Date/Time   IRON 33* 02/02/2011 2233   TIBC 211* 02/02/2011 2233   FERRITIN 306* 02/02/2011 2233     EKG Orders placed in visit on 03/13/12  . EKG 12-LEAD     Prior Assessment and Plan Problem List as of 04/25/2012     ICD-9-CM   COPD (chronic obstructive pulmonary disease)   Last Assessment & Plan   02/20/2011 Office Visit Written 02/20/2011 12:27 PM by Lendon Colonel, NP     Breathing status is stable. No excessive DOE.    Hypertension   Last Assessment & Plan   03/13/2012 Office Visit Written 03/13/2012  2:13 PM by Lendon Colonel, NP     I have explained the dangers of not taking medications as directed in the setting of elevated BP. She is willing to try taking one medication at a time to see if this aggravates diarrhea. In the setting of diabetes, I will place her back on lisinopril 20 mg BID with follow up BP check in one week and BMET in a week, with follow up appointment with me in one month. She has new insurance and requests that the Rx be mailed so that it is delivered to her. She will need to take lisinopril dose she has at home until new medications are delivered.    Hyperlipidemia   Last Assessment & Plan   03/13/2012 Office Visit Written 03/13/2012  2:14 PM by Lendon Colonel, NP     She is not on a statin at this time. She refuses statin therapy at this time, but is willing to try this again if the other medications do not cause stomach upset. Will restart medications a little at a time. She needs coaxing to be compliant.    Hypertensive urgency   Diabetes type 2, uncontrolled   Last Assessment & Plan   03/13/2012 Office Visit Written 03/13/2012  2:16 PM by Lendon Colonel, NP     She is advised to see  PCP for  reinstitution of insulin. She verbalizes understanding.    Pulmonary hypertension   Hypokalemia   Normocytic anemia   Acute diastolic heart failure   Last Assessment & Plan   03/13/2012 Office Visit Written 03/13/2012  2:14 PM by Lendon Colonel, NP     No evidence of fluid overload at this time.        Imaging: No results found.

## 2012-04-25 NOTE — Addendum Note (Signed)
Addended by: Shara Blazing A on: 04/25/2012 02:30 PM   Modules accepted: Orders

## 2012-04-25 NOTE — Assessment & Plan Note (Signed)
No evidence of decompensation at this time. We will follow her closely.

## 2012-05-14 ENCOUNTER — Ambulatory Visit (INDEPENDENT_AMBULATORY_CARE_PROVIDER_SITE_OTHER): Payer: Medicare Other | Admitting: *Deleted

## 2012-05-14 ENCOUNTER — Encounter: Payer: Self-pay | Admitting: *Deleted

## 2012-05-14 VITALS — BP 198/78 | HR 62 | Ht 61.0 in | Wt 140.8 lb

## 2012-05-14 DIAGNOSIS — I10 Essential (primary) hypertension: Secondary | ICD-10-CM

## 2012-05-14 MED ORDER — CLONIDINE HCL 0.2 MG/24HR TD PTWK: 1.0000 | MEDICATED_PATCH | TRANSDERMAL | Status: DC

## 2012-05-14 MED ORDER — LISINOPRIL 20 MG PO TABS: ORAL_TABLET | ORAL | Status: DC

## 2012-05-14 NOTE — Progress Notes (Signed)
Reviewed with Forbes Cellar LPN by phone, Follow up scheduled.

## 2012-05-14 NOTE — Patient Instructions (Addendum)
Your physician recommends that you schedule a follow-up appointment in: NURSE VISIT IN 2 WEEKS TO RECHECK BP   Your physician has recommended you make the following change in your medication:   1) INCREASE LINISOPRIL TO 40MG  EVERY MORNING AND 20MG  EVERY EVENING  2) INCREASE PATCH TO 0.2MG  24HR FOR SEVEN DAYS THEN CHANGE PATCH   WAS ADVISED VERBALLY BY [facility] REP PER PT REQUEST THAT PT CAN WEAR 2 OF THE 0.1MG  PATCHES UNTIL THE RX IS COMPLETE, SENT NEW RX'S TO PT PRIMEMAIL PER PT WILL HAVE ENOUGH TO LAST UNTIL MAIL ORDER COMES IN, ADVISED IF SHE DOES FALL SHORT TO CALL OFFICE TO SEND REFILL TO LOCAL PHARMACY

## 2012-05-14 NOTE — Progress Notes (Signed)
PT PRESENTED TO [facility] FOR BP CHECK TODAY PER RECENT CHANGE IN MEDICATIONS TO ADD 0.1MG  CLONIDINE PATCH AND LISINOPRIL 20MG  BID, PT STATES SHE FEELS GOOD TODAY HOWEVER NOTED BP IN BOTH ARMS AS ELEVATED 198/78 IN R ARM WITH 62HR 194/80 IN L ARM WITH 62 HR  THIS NURSE CONTACTED [facility] NP AND WAS GIVEN VERBAL ORDERS AS FOLLOWS:  1) INCREASE LINISOPRIL TO 40MG  EVERY MORNING AND 20MG  EVERY EVENING 2) INCREASE PATCH TO 0.2MG  24HR FOR SEVEN DAYS THEN CHANGE PATCH  WAS ADVISED VERBALLY BY [facility] REP PER PT REQUEST THAT PT CAN WEAR 2 OF THE 0.1MG  PATCHES UNTIL THE RX IS COMPLETE, SENT NEW RX'S TO PT PRIMEMAIL PER PT WILL HAVE ENOUGH TO LAST UNTIL MAIL ORDER COMES IN, ADVISED IF SHE DOES FALL SHORT TO CALL OFFICE TO SEND REFILL TO LOCAL PHARMACY

## 2012-05-24 ENCOUNTER — Encounter: Payer: Self-pay | Admitting: Adult Health

## 2012-05-24 ENCOUNTER — Ambulatory Visit (INDEPENDENT_AMBULATORY_CARE_PROVIDER_SITE_OTHER): Payer: Medicare Other | Admitting: Adult Health

## 2012-05-24 VITALS — BP 231/90 | HR 70 | Ht 67.0 in | Wt 140.0 lb

## 2012-05-24 DIAGNOSIS — I10 Essential (primary) hypertension: Secondary | ICD-10-CM

## 2012-05-24 DIAGNOSIS — I5031 Acute diastolic (congestive) heart failure: Secondary | ICD-10-CM

## 2012-05-24 MED ORDER — HYDROCHLOROTHIAZIDE 12.5 MG PO CAPS: 12.5000 mg | ORAL_CAPSULE | Freq: Every day | ORAL | Status: DC

## 2012-05-24 MED ORDER — AMLODIPINE BESYLATE 5 MG PO TABS: 5.0000 mg | ORAL_TABLET | Freq: Every day | ORAL | Status: DC

## 2012-05-24 NOTE — Addendum Note (Signed)
Addended by: Shara Blazing A on: 05/24/2012 02:42 PM   Modules accepted: Orders

## 2012-05-24 NOTE — Progress Notes (Signed)
   HPI: Mrs.Tonche is a 72 year old patient of Dr. Jacqulyn Ducking we are seeing for ongoing assessment and management of diastolic CHF with history of hypertension, hyperlipidemia, and multiple cardiovascular risk factors. She was last in the office on 04/25/2012 and was found to be hypertensive. She is very difficult to control concerning her blood pressure management patient was changed to a TTS clonidine patch 0.1 mg weekly. She refused amlodipine.  Followup blood pressure check on 05/15/2012 and a strata blood pressure 198/78 in her right arm, and 194/80 in her left arm, we increased lisinopril to 40 mg in the morning and 20 mg in the evening and increased her clonidine patch 0.2 mg weekly. She is here for ongoing assessment of blood pressure control.  She comes today with continued BP elevation. She denies medical noncompliance. Allergies  Allergen Reactions  . Aspirin   . Metformin And Related Diarrhea    Current Outpatient Prescriptions  Medication Sig Dispense Refill  . acetaminophen (TYLENOL) 500 MG tablet Take 500 mg by mouth every 6 (six) hours as needed for pain.      . cloNIDine (CATAPRES - DOSED IN MG/24 HR) 0.1 mg/24hr patch Place 2 patches onto the skin once a week.      . insulin glargine (LANTUS SOLOSTAR) 100 UNIT/ML injection Inject 15 Units into the skin every morning.      Marland Kitchen lisinopril (PRINIVIL,ZESTRIL) 20 MG tablet TAKE 40MG  (TAKE TWO TABLETS) EVERY MORNING AND 20MG  EVERY EVENING  270 tablet  3   No current facility-administered medications for this visit.    Past Medical History  Diagnosis Date  . Diabetes mellitus   . Hypertension   . Hyperlipidemia   . Tricuspid regurgitation     Echo 05/02/06-Nml LV. Mild MR. Mod TR  . Osteoporosis     Past Surgical History  Procedure Laterality Date  . Heart chamber revision      patched hole --1993  . Tubal ligation      [id number] of systems complete and found to be negative unless listed above  PHYSICAL EXAM BP  231/90  Pulse 70  Ht 5\' 7"  (1.702 m)  Wt 140 lb (63.504 kg)  BMI 21.92 kg/m2  General: Well developed, well nourished, in no acute distress Head: Eyes PERRLA, No xanthomas.   Normal cephalic and atramatic  Lungs: Clear bilaterally to auscultation and percussion. Heart: HRRR S1 S2, without MRG.  Pulses are 2+ & equal.            No carotid bruit. No JVD.  No abdominal bruits. No femoral bruits. Abdomen: Bowel sounds are positive, abdomen soft and non-tender without masses or                  Hernia's noted. Msk:  Back normal, normal gait. Normal strength and tone for age. Extremities: No clubbing, cyanosis or edema.  DP +1 Neuro: Alert and oriented X 3. Psych:  Good affect, responds appropriately   ASSESSMENT AND PLAN

## 2012-05-24 NOTE — Assessment & Plan Note (Signed)
No evidence of fluid overload.Continued current medications.

## 2012-05-24 NOTE — Assessment & Plan Note (Signed)
Will try again to add amloipine 5 mg. She is willing to try this. Will add HCTZ 12.5 mg daily as well. Will have [facility] see her. Cell [phone]. Follow up BP check. She seems somewhat confused. Will have close follow up.

## 2012-05-24 NOTE — Patient Instructions (Addendum)
Your physician recommends that you schedule a follow-up appointment in: 1 week with [place] physician has recommended you make the following change in your medication: SENT TO [facility] [place]    1) NORVASC 5 MG ONE TABLET DAILY 2) HCTZ 12.5MG  DAILY  A staff member from [facility] will contact you about an appointment date and time

## 2012-05-28 ENCOUNTER — Telehealth: Payer: Self-pay | Admitting: *Deleted

## 2012-05-28 NOTE — Telephone Encounter (Signed)
Sent [facility] rep Orson Ape information for this pt, per noted pt has been unable to contact in the past, told KL on recent OV 05-24-12 that she does not hear her cell phone in the other room and pt travels a lot, pt advised to listen for her cell phone closely in the recent days to come for [facility] rep to contact her for home evaluation, will pend PN until response from [facility] rep noted, pt/KL aware [facility] has been contacted

## 2012-05-30 ENCOUNTER — Ambulatory Visit: Payer: Medicare Other | Admitting: Adult Health

## 2012-05-31 NOTE — Telephone Encounter (Signed)
Call from [place] with [facility] to advise she will call pt today to discuss evaluation, will call office if contact was made

## 2012-06-03 ENCOUNTER — Encounter: Payer: Medicare Other | Admitting: Adult Health

## 2012-06-03 NOTE — Progress Notes (Signed)
No show

## 2012-06-03 NOTE — Telephone Encounter (Signed)
Received incoming email that pt has been contacted via vm with no call back at this time, will continue to try to contact pt,KL made aware verbally, advised that maybe [facility] could meet pt in office, noted pt no showed for apt today with KL

## 2012-06-06 ENCOUNTER — Ambulatory Visit: Payer: Medicare Other | Admitting: Adult Health

## 2012-06-07 ENCOUNTER — Ambulatory Visit: Payer: Medicare Other | Admitting: Adult Health

## 2012-06-10 ENCOUNTER — Ambulatory Visit (INDEPENDENT_AMBULATORY_CARE_PROVIDER_SITE_OTHER): Payer: Medicare Other | Admitting: Adult Health

## 2012-06-10 ENCOUNTER — Encounter: Payer: Self-pay | Admitting: Adult Health

## 2012-06-10 VITALS — BP 211/76 | HR 65 | Ht 67.0 in | Wt 139.0 lb

## 2012-06-10 DIAGNOSIS — I10 Essential (primary) hypertension: Secondary | ICD-10-CM

## 2012-06-10 DIAGNOSIS — I5031 Acute diastolic (congestive) heart failure: Secondary | ICD-10-CM

## 2012-06-10 MED ORDER — AMLODIPINE BESYLATE 10 MG PO TABS: 10.0000 mg | ORAL_TABLET | Freq: Every day | ORAL | Status: DC

## 2012-06-10 NOTE — Progress Notes (Deleted)
Name: Laura Best    DOB: August 21, 1940  Age: 72 y.o.  MR#: [id number]       PCP:  [name], PA-C      Insurance: Payor: [employer] [place] / Plan: BLUE MEDICARE / Product Type: *No Product type* /   CC:    Chief Complaint  Patient presents with  . Hypertension    VS Filed Vitals:   06/10/12 1105  BP: 211/76  Pulse: 65  Height: 5\' 7"  (1.702 m)  Weight: 139 lb (63.05 kg)    Weights Current Weight  06/10/12 139 lb (63.05 kg)  05/24/12 140 lb (63.504 kg)  05/14/12 140 lb 12 oz (63.844 kg)    Blood Pressure  BP Readings from Last 3 Encounters:  06/10/12 211/76  05/24/12 231/90  05/14/12 198/78     Admit date:  (Not on file) Last encounter with RMR:  06/07/2012   Allergy Aspirin and Metformin and related  Current Outpatient Prescriptions  Medication Sig Dispense Refill  . acetaminophen (TYLENOL) 500 MG tablet Take 500 mg by mouth every 6 (six) hours as needed for pain.      Marland Kitchen amLODipine (NORVASC) 5 MG tablet Take 1 tablet (5 mg total) by mouth daily.  30 tablet  6  . cloNIDine (CATAPRES - DOSED IN MG/24 HR) 0.1 mg/24hr patch Place 2 patches onto the skin once a week.      . hydrochlorothiazide (MICROZIDE) 12.5 MG capsule Take 1 capsule (12.5 mg total) by mouth daily.  30 capsule  6  . insulin glargine (LANTUS SOLOSTAR) 100 UNIT/ML injection Inject 15 Units into the skin every morning.      Marland Kitchen lisinopril (PRINIVIL,ZESTRIL) 20 MG tablet TAKE 40MG  (TAKE TWO TABLETS) EVERY MORNING AND 20MG  EVERY EVENING  270 tablet  3   No current facility-administered medications for this visit.    Discontinued Meds:   There are no discontinued medications.  Patient Active Problem List   Diagnosis Date Noted  . Acute diastolic heart failure [id number]  . Hypertensive urgency 02/02/2011  . Diabetes type 2, uncontrolled 02/02/2011  . Pulmonary hypertension 02/02/2011  . COPD (chronic obstructive pulmonary disease) 02/02/2011  . Hypokalemia 02/02/2011  .  Normocytic anemia 02/02/2011  . Hypertension   . Hyperlipidemia     LABS    Component Value Date/Time   NA 140 04/01/2012 0000   NA 141 09/08/2011 1355   NA 139 03/06/2011 1130   K 4.4 04/01/2012 0000   K 3.4* 09/08/2011 1355   K 4.5 03/06/2011 1130   CL 107 04/01/2012 0000   CL 105 09/08/2011 1355   CL 101 03/06/2011 1130   CO2 23 04/01/2012 0000   CO2 23 09/08/2011 1355   CO2 25 03/06/2011 1130   GLUCOSE 63* 04/01/2012 0000   GLUCOSE 119* 09/08/2011 1355   GLUCOSE 205* 03/06/2011 1130   BUN 22 04/01/2012 0000   BUN 11 09/08/2011 1355   BUN 34* 03/06/2011 1130   CREATININE 1.21* 04/01/2012 0000   CREATININE 1.13* 09/08/2011 1355   CREATININE 1.51* 03/06/2011 1130   CREATININE 1.21* 02/06/2011 0453   CREATININE 1.19* 02/05/2011 0627   CREATININE 1.23* 03/28/2010 1704   CALCIUM 9.2 04/01/2012 0000   CALCIUM 9.4 09/08/2011 1355   CALCIUM 9.5 03/06/2011 1130   GFRNONAA 48* 09/08/2011 1355   GFRNONAA 44* 02/06/2011 0453   GFRNONAA 45* 02/05/2011 0627   GFRAA 55* 09/08/2011 1355   GFRAA 51* 02/06/2011 0453   GFRAA 52* 02/05/2011 [id number]  CMP     Component Value Date/Time   NA 140 04/01/2012 0000   K 4.4 04/01/2012 0000   CL 107 04/01/2012 0000   CO2 23 04/01/2012 0000   GLUCOSE 63* 04/01/2012 0000   BUN 22 04/01/2012 0000   CREATININE 1.21* 04/01/2012 0000   CREATININE 1.13* 09/08/2011 1355   CALCIUM 9.2 04/01/2012 0000   PROT 7.1 04/01/2012 0000   ALBUMIN 3.7 04/01/2012 0000   AST 13 04/01/2012 0000   ALT 13 04/01/2012 0000   ALKPHOS 103 04/01/2012 0000   BILITOT 0.3 04/01/2012 0000   GFRNONAA 48* 09/08/2011 1355   GFRAA 55* 09/08/2011 1355       Component Value Date/Time   WBC 6.0 09/08/2011 1355   WBC 6.9 02/03/2011 0455   WBC 5.8 02/02/2011 1952   HGB 12.4 09/08/2011 1355   HGB 10.4* 02/03/2011 0455   HGB 10.5* 02/02/2011 1952   HCT 36.2 09/08/2011 1355   HCT 30.9* 02/03/2011 0455   HCT 31.4* 02/02/2011 1952   MCV 83.8 09/08/2011 1355   MCV 85.6 02/03/2011 0455   MCV 85.1 02/02/2011 1952    Lipid Panel      Component Value Date/Time   CHOL 177 02/04/2011 0655   TRIG 188* 02/04/2011 0655   HDL 57 02/04/2011 0655   CHOLHDL 3.1 02/04/2011 0655   VLDL 38 02/04/2011 0655   LDLCALC 82 02/04/2011 0655    ABG    Component Value Date/Time   TCO2 22 12/24/2008 2235     Lab Results  Component Value Date   TSH 2.117 02/02/2011   BNP (last 3 results) No results found for this basename: PROBNP,  in the last 8760 hours Cardiac Panel (last 3 results) No results found for this basename: CKTOTAL, CKMB, TROPONINI, RELINDX,  in the last 72 hours  Iron/TIBC/Ferritin    Component Value Date/Time   IRON 33* 02/02/2011 2233   TIBC 211* 02/02/2011 2233   FERRITIN 306* 02/02/2011 2233     EKG Orders placed in visit on 03/13/12  . EKG 12-LEAD     Prior Assessment and Plan Problem List as of 06/10/2012   COPD (chronic obstructive pulmonary disease)   Last Assessment & Plan   02/20/2011 Office Visit Written 02/20/2011 12:27 PM by Lendon Colonel, NP     Breathing status is stable. No excessive DOE.    Hypertension   Last Assessment & Plan   05/24/2012 Office Visit Written 05/24/2012  2:21 PM by Lendon Colonel, NP     Will try again to add amloipine 5 mg. She is willing to try this. Will add HCTZ 12.5 mg daily as well. Will have [facility] see her. Cell [phone]. Follow up BP check. She seems somewhat confused. Will have close follow up.     Hyperlipidemia   Last Assessment & Plan   03/13/2012 Office Visit Written 03/13/2012  2:14 PM by Lendon Colonel, NP     She is not on a statin at this time. She refuses statin therapy at this time, but is willing to try this again if the other medications do not cause stomach upset. Will restart medications a little at a time. She needs coaxing to be compliant.    Hypertensive urgency   Diabetes type 2, uncontrolled   Last Assessment & Plan   03/13/2012 Office Visit Written 03/13/2012  2:16 PM by Lendon Colonel, NP     She is advised to see PCP for  reinstitution of insulin. She verbalizes understanding.  Pulmonary hypertension   Hypokalemia   Normocytic anemia   Acute diastolic heart failure   Last Assessment & Plan   05/24/2012 Office Visit Written 05/24/2012  2:22 PM by Lendon Colonel, NP     No evidence of fluid overload.Continued current medications.        Imaging: No results found.

## 2012-06-10 NOTE — Assessment & Plan Note (Signed)
She is given an extra 5 mg of Norvasc here in the office and asked to wait in the waiting room. I have rechecked her BP here in the office manually, and it corresponds to triage readings. I will have a renal ultrasound completed to evaluate for RAS. I have contacted [facility] to make sure that they see her as there has been problems with her meeting with them. I will wait to titrate her medications further once her renal artery scan is completed.

## 2012-06-10 NOTE — Progress Notes (Signed)
HPI: [name] is a 72 year old patient of Dr. Lattie Haw were following for ongoing assessment and management of diastolic CHF with difficult to control hypertension hyperlipidemia multiple cardiovascular risk factors. On last visit the patient remained hypotensive and therefore medications were adjusted. We adapted amlodipine 5 mg and HCTZ 12.5 mg daily.   She comes today, hypertensive, despite addition of two new medications. She is stating that she is taking the medications as directed.  She is symptomatic for dizziness, chest pain, or dyspnea.  Allergies  Allergen Reactions  . Aspirin   . Metformin And Related Diarrhea    Current Outpatient Prescriptions  Medication Sig Dispense Refill  . acetaminophen (TYLENOL) 500 MG tablet Take 500 mg by mouth every 6 (six) hours as needed for pain.      Marland Kitchen amLODipine (NORVASC) 5 MG tablet Take 1 tablet (5 mg total) by mouth daily.  30 tablet  6  . cloNIDine (CATAPRES - DOSED IN MG/24 HR) 0.1 mg/24hr patch Place 2 patches onto the skin once a week.      . hydrochlorothiazide (MICROZIDE) 12.5 MG capsule Take 1 capsule (12.5 mg total) by mouth daily.  30 capsule  6  . insulin glargine (LANTUS SOLOSTAR) 100 UNIT/ML injection Inject 15 Units into the skin every morning.      Marland Kitchen lisinopril (PRINIVIL,ZESTRIL) 20 MG tablet TAKE 40MG  (TAKE TWO TABLETS) EVERY MORNING AND 20MG  EVERY EVENING  270 tablet  3   No current facility-administered medications for this visit.    Past Medical History  Diagnosis Date  . Diabetes mellitus   . Hypertension   . Hyperlipidemia   . Tricuspid regurgitation     Echo 05/02/06-Nml LV. Mild MR. Mod TR  . Osteoporosis     Past Surgical History  Procedure Laterality Date  . Heart chamber revision      patched hole --1993  . Tubal ligation      [id number]: Well developed, well nourished, in no acute distress Head: Eyes PERRLA, No xanthomas.   Normal cephalic and atramatic  Lungs: Clear bilaterally to auscultation and  percussion. Heart: HRRR S1 S2, without MRG.  Pulses are 2+ & equal.            No carotid bruit. No JVD.  No abdominal bruits. No femoral bruits. Abdomen: Bowel sounds are positive, abdomen soft and non-tender without masses or                  Hernia's noted. Msk:  Back normal, normal gait. Normal strength and tone for age. Extremities: No clubbing, cyanosis or edema.  DP +1 Neuro: Alert and oriented X 3. Psych:  Good affect, responds appropriately  PHYSICAL EXAM BP 211/76  Pulse 65  Ht 5\' 7"  (1.702 m)  Wt 139 lb (63.05 kg)  BMI 21.77 kg/m2 General: Well developed, well nourished, in no acute distress Head: Eyes PERRLA, No xanthomas.   Normal cephalic and atramatic  Lungs: Inspiratory and expiratory wheezes. Marland Kitchen Heart: HRRR S1 S2, without MRG.  Pulses are 2+ & equal.            No carotid bruit. No JVD.  No abdominal bruits. No femoral bruits. Abdomen: Bowel sounds are positive, abdomen soft and non-tender without masses or                  Hernia's noted. Msk:  Back normal, normal gait. Normal strength and tone for age. Extremities: No clubbing, cyanosis or edema.  DP +1 Neuro: Alert and oriented X  3. Psych:  Good affect, responds appropriately  EKG:  ASSESSMENT AND PLAN

## 2012-06-10 NOTE — Addendum Note (Signed)
Addended by: Shara Blazing A on: 06/10/2012 12:40 PM   Modules accepted: Orders

## 2012-06-10 NOTE — Assessment & Plan Note (Signed)
No evidence of fluid overload at this time. Continue medications. Consider adding chlorthalidone once her renal ultrasound it completed if no RAS is noted.

## 2012-06-10 NOTE — Patient Instructions (Addendum)
Your physician recommends that you schedule a follow-up appointment in: with RR after [place] Test   Your physician has requested that you have a renal artery duplex. During this test, an ultrasound is used to evaluate blood flow to the kidneys. Allow one hour for this exam. Do not eat after midnight the day before and avoid carbonated beverages. Take your medications as you usually do.  Your physician has recommended you make the following change in your medication:   1) INCREASE YOUR AMLODIPINE TO 10MG  DAILY  [facility] REP WILL BE CONTACTING YOU AND YOUR SON TO SET UP A TIME TO COME OUT TO YOUR HOME TO SEE WHAT CONCERNS YOU MAY HAVE

## 2012-06-13 ENCOUNTER — Ambulatory Visit (INDEPENDENT_AMBULATORY_CARE_PROVIDER_SITE_OTHER): Payer: Medicare Other | Admitting: Physician Assistant

## 2012-06-13 VITALS — BP 120/82 | HR 58 | Temp 97.1°F | Resp 16 | Ht 63.5 in | Wt 140.0 lb

## 2012-06-13 DIAGNOSIS — E1165 Type 2 diabetes mellitus with hyperglycemia: Secondary | ICD-10-CM

## 2012-06-13 DIAGNOSIS — IMO0001 Reserved for inherently not codable concepts without codable children: Secondary | ICD-10-CM

## 2012-06-13 LAB — GLUCOSE, FINGERSTICK (STAT): Glucose, fingerstick: 181 mg/dL — ABNORMAL HIGH (ref 70–99)

## 2012-06-13 LAB — HEMOGLOBIN A1C, FINGERSTICK: Hgb A1C (fingerstick): 7.2 % — ABNORMAL HIGH

## 2012-06-13 NOTE — Progress Notes (Signed)
Patient ID: DANYELA NALLEY MRN: [id number], DOB: 1940-06-25, 72 y.o. Date of Encounter: 06/13/2012, 12:50 PM    Chief Complaint:  Chief Complaint  Patient presents with  . other    ck bs, bp     HPI: 72 y.o. year old female here for f/u of Diabetes. LOV here was 03/2010. She has been seeing [facility] but has seen no other medical providers. When I asked where she had been, she says she has insurance but didn't have hte money to pay copay so tht is why she hasnot been here.  She has been using Lantus 15 units QAM all this time until it just ran out 4 days ago.  She has a BS log, which I reviewed, but hte numbers make no sense. She has quite a few readings in hte 30s but she says she felt totally normal at those times. Then, some readings same day in 200s --???  Is not very careful with monitoring carbohydrate intake.  When I asked if she exercise she says "yes" -leg lifts and walks to mailbox and back. !   Just had f/u with Cardiology 06/10/2012. They are managing BP etc.  Home Meds: See attached medication section for any medications that were entered at today's visit. The computer does not put those onto this list.The following list is a list of meds entered prior to today's visit.   Current Outpatient Prescriptions on File Prior to Visit  Medication Sig Dispense Refill  . acetaminophen (TYLENOL) 500 MG tablet Take 500 mg by mouth every 6 (six) hours as needed for pain.      Marland Kitchen amLODipine (NORVASC) 10 MG tablet Take 1 tablet (10 mg total) by mouth daily.  90 tablet  1  . cloNIDine (CATAPRES - DOSED IN MG/24 HR) 0.1 mg/24hr patch Place 2 patches onto the skin once a week.      . hydrochlorothiazide (MICROZIDE) 12.5 MG capsule Take 1 capsule (12.5 mg total) by mouth daily.  30 capsule  6  . insulin glargine (LANTUS SOLOSTAR) 100 UNIT/ML injection Inject 15 Units into the skin every morning.      Marland Kitchen lisinopril (PRINIVIL,ZESTRIL) 20 MG tablet TAKE 40MG  (TAKE TWO TABLETS) EVERY  MORNING AND 20MG  EVERY EVENING  270 tablet  3   No current facility-administered medications on file prior to visit.    Allergies:  Allergies  Allergen Reactions  . Aspirin   . Metformin And Related Diarrhea      Review of Systems: See HPI for pertinent ROS. All other ROS negative.    Physical Exam: Blood pressure 120/82, pulse 58, temperature 97.1 F (36.2 C), temperature source Oral, resp. rate 16, height 5' 3.5" (1.613 m), weight 140 lb (63.504 kg)., Body mass index is 24.41 kg/(m^2). General: WNWD WF. Appears in no acute distress. Neck: Supple. No thyromegaly. No lymphadenopathy.No carotid bruit. Lungs: Clear bilaterally to auscultation without wheezes, rales, or rhonchi. Breathing is unlabored. Heart: Regular rhythm. No murmurs, rubs, or gallops. Abdomen: Soft, non-tender, non-distended with normoactive bowel sounds. No hepatomegaly. No rebound/guarding. No obvious abdominal masses. Msk:  Strength and tone normal for age. Extremities/Skin: Warm and dry. No clubbing or cyanosis. No edema. No rashes or suspicious lesions. Neuro: Alert and oriented X 3. Moves all extremities spontaneously. Gait is normal. CNII-XII grossly in tact. Psych:  Responds to questions appropriately with a normal affect. DIABETIC FOOT EXAM; SEE ATTACHED SECTION ALSO: Inspection: She has bunions bilaterally. O/W inspection nml. No callous. No open sores. No palpable Dorsalis  Pedis or Posterior Tibialis Pulse Bilaterally     ASSESSMENT AND PLAN:  72 y.o. year old female with  1. Diabetes type 2, uncontrolled - Glucose, fingerstick (stat) - Hemoglobin A1C, fingerstick - Microalbumin, urine  Cont to F/U with Cardiology for those issues. They are managing BP, Lipids, etc.  Signed, [address] [place], [place], [facility] 06/13/2012 12:50 PM

## 2012-06-15 MED ORDER — GLIPIZIDE 5 MG PO TABS: 5.0000 mg | ORAL_TABLET | Freq: Every day | ORAL | Status: DC

## 2012-06-15 MED ORDER — INSULIN GLARGINE 100 UNIT/ML ~~LOC~~ SOLN: 15.0000 [IU] | SUBCUTANEOUS | Status: DC

## 2012-06-15 NOTE — Addendum Note (Signed)
Addended by: Daylene Posey T on: 06/15/2012 10:10 AM   Modules accepted: Orders

## 2012-06-18 ENCOUNTER — Encounter: Payer: Self-pay | Admitting: Physician Assistant

## 2012-06-18 ENCOUNTER — Encounter (INDEPENDENT_AMBULATORY_CARE_PROVIDER_SITE_OTHER): Payer: Medicare Other

## 2012-06-18 DIAGNOSIS — I10 Essential (primary) hypertension: Secondary | ICD-10-CM

## 2012-06-18 DIAGNOSIS — I5031 Acute diastolic (congestive) heart failure: Secondary | ICD-10-CM

## 2012-06-18 NOTE — Telephone Encounter (Signed)
Spoke to [facility] rep julie farmer in absence of AG to see if an apt can be made for the pt to be evaluated while in office with KL at next OV, was advised by JF that she will inform AG the need for this pt per noted BP very high while in office and several attempts have been made to contact this pt prior, also gave JF the pt son information to contact him in the evening to see if a meeting can be set up for this pt via the son to ensure this does occur per several attempts to contact pt have been unsuccessful, KL NP made aware, also called and left message for AG to advise any update at this point

## 2012-06-19 ENCOUNTER — Telehealth: Payer: Self-pay | Admitting: Family Medicine

## 2012-06-19 NOTE — Telephone Encounter (Signed)
Pt called about lab results and has received her new medications.

## 2012-06-19 NOTE — Telephone Encounter (Signed)
Message copied by Olena Mater on Wed Jun 19, 2012  3:17 PM ------      Message from: Dena Billet      Created: Fri Jun 14, 2012  8:01 AM       A1C is 7.2.      Currently, her only med for diabetes is Lantus 15 units once a day.      The only diabetes med she cannot take is Metformin-caused diarrhea.      Also, will avoid Actos given cardiac history.       She cannot afford branded meds.       Start Glucotrol XL 5mg  one Q AM      Cont Lantus 15 units once a day.       Make sure to sched f/u OV in 3 mos.       Buy a new meter-That one cannot be working correctly.       Staff: Send in new RX for Lantus, Glucotrol XL, and for the meter. (She was running out of Lantus at OV-I waited to refill once got labs-she had not been seen here in 2 years) ------

## 2012-06-24 NOTE — Telephone Encounter (Signed)
Noted upcoming apt with [facility] has been  Scheduled via pt son, KL made aware

## 2012-06-25 ENCOUNTER — Ambulatory Visit: Payer: Medicare Other | Admitting: Adult Health

## 2012-06-30 NOTE — Progress Notes (Signed)
HPI Laura Best is a 72 year old patient of Dr. Lattie Haw we are following for ongoing assessment and management of diastolic CHF and difficult to control hypertension. The patient has been seen by pH and within the last week. On that visit was found the patient was medically compliant, blood pressure taken at that visit demonstrated a blood pressure in the [id number] systolically. The patient on last visit on 06/10/2012 was started on amlodipine 10mg   and HCTZ. A followup renal artery ultrasound was completed which was negative for renal artery stenosis bilaterally but she did have some right renal artery in stenosis noted although this was inconclusive due to technique.   She comes today without complaints. She is brought her blood pressure record with her. It is very labile and [id number] systolically. Since she takes her blood pressure several times a day. She is compliant. I reviewed her medications in the office. She is due to have new medication sent to  her this week to include clonidine patches 0.2 mg weekly, instead of 0.1 mg patches which she is wearing two of. I have reviewed the notes from [facility].  Allergies  Allergen Reactions  . Aspirin   . Metformin And Related Diarrhea    Current Outpatient Prescriptions  Medication Sig Dispense Refill  . acetaminophen (TYLENOL) 500 MG tablet Take 500 mg by mouth every 6 (six) hours as needed for pain.      Marland Kitchen amLODipine (NORVASC) 10 MG tablet Take 1 tablet (10 mg total) by mouth daily.  90 tablet  1  . cloNIDine (CATAPRES - DOSED IN MG/24 HR) 0.1 mg/24hr patch Place 2 patches onto the skin once a week.      Marland Kitchen glipiZIDE (GLUCOTROL) 5 MG tablet Take 1 tablet (5 mg total) by mouth daily.  30 tablet  2  . hydrochlorothiazide (MICROZIDE) 12.5 MG capsule Take 1 capsule (12.5 mg total) by mouth daily.  30 capsule  6  . insulin glargine (LANTUS) 100 UNIT/ML injection Inject 0.15 mLs (15 Units total) into the skin every morning.  10 mL  2  . lisinopril  (PRINIVIL,ZESTRIL) 20 MG tablet TAKE 40MG  (TAKE TWO TABLETS) EVERY MORNING AND 20MG  EVERY EVENING  270 tablet  3   No current facility-administered medications for this visit.    Past Medical History  Diagnosis Date  . Diabetes mellitus   . Hypertension   . Hyperlipidemia   . Tricuspid regurgitation     Echo 05/02/06-Nml LV. Mild MR. Mod TR  . Osteoporosis     Past Surgical History  Procedure Laterality Date  . Heart chamber revision      patched hole --1993  . Tubal ligation      [id number] of systems complete and found to be negative unless listed above  PHYSICAL EXAM BP 180/70  Pulse 78  Ht 5\' 3"  (1.6 m)  Wt 141 lb 12 oz (64.297 kg)  BMI 25.12 kg/m2 General: Well developed, well nourished, in no acute distress Head: Eyes PERRLA, No xanthomas.   Normal cephalic and atramatic  Lungs: Clear bilaterally to auscultation and percussion. Heart: HRRR S1 S2, without MRG.  Pulses are 2+ & equal.            No carotid bruit. No JVD.  No abdominal bruits. No femoral bruits. Abdomen: Bowel sounds are positive, abdomen soft and non-tender without masses or                  Hernia's noted. Msk:  Back normal, normal gait. Normal strength  and tone for age. Extremities: No clubbing, cyanosis or edema.  DP +1 Neuro: Alert and oriented X 3. Psych:  Good affect, responds appropriately   ASSESSMENT AND PLAN

## 2012-07-01 ENCOUNTER — Ambulatory Visit (INDEPENDENT_AMBULATORY_CARE_PROVIDER_SITE_OTHER): Payer: Medicare Other | Admitting: Adult Health

## 2012-07-01 ENCOUNTER — Encounter: Payer: Self-pay | Admitting: Adult Health

## 2012-07-01 VITALS — BP 180/70 | HR 78 | Ht 63.0 in | Wt 141.8 lb

## 2012-07-01 DIAGNOSIS — I5031 Acute diastolic (congestive) heart failure: Secondary | ICD-10-CM

## 2012-07-01 DIAGNOSIS — I10 Essential (primary) hypertension: Secondary | ICD-10-CM

## 2012-07-01 NOTE — Assessment & Plan Note (Signed)
I extensively reviewed her blood pressure log, medications, also had conversation with RN at [facility] concerning home medications and blood pressures on their visit. It appears that the patient does have whitecoat syndrome as her blood pressures are very well-controlled at home. They are labile on her blood pressure record. She is asymptomatic. I have reviewed the ultrasound of her renal arteries and does not find have significant renal artery stenosis, with a comment on the right renal artery stating that there may have been some stenosis but technique was difficult.  I have advised her to continue take her medications as directed. She brings with her a recent eye doctor appointment with a blood pressure check completed the air at 120/70. We will see her again in 6 months unless she becomes symptomatic, continue to have [facility] and see her on a monthly basis. Happy to see her sooner should she be symptomatic.

## 2012-07-01 NOTE — Assessment & Plan Note (Signed)
No evidence of decompensation on exam. No changes.

## 2012-07-01 NOTE — Patient Instructions (Addendum)
Your physician recommends that you schedule a follow-up appointment in: 6 months with Jory Sims, NP.

## 2012-07-08 ENCOUNTER — Ambulatory Visit: Payer: Medicare Other | Admitting: Family Medicine

## 2012-07-16 ENCOUNTER — Encounter (HOSPITAL_COMMUNITY): Payer: Self-pay | Admitting: *Deleted

## 2012-07-16 ENCOUNTER — Emergency Department (HOSPITAL_COMMUNITY)
Admission: EM | Admit: 2012-07-16 | Discharge: 2012-07-17 | Disposition: A | Discharge status: Home / Self Care | Payer: Medicare Other | Source: Home / Self Care | Attending: Emergency Medicine | Admitting: Emergency Medicine

## 2012-07-16 ENCOUNTER — Emergency Department (HOSPITAL_COMMUNITY): Payer: Medicare Other

## 2012-07-16 DIAGNOSIS — Z79899 Other long term (current) drug therapy: Secondary | ICD-10-CM | POA: Insufficient documentation

## 2012-07-16 DIAGNOSIS — E785 Hyperlipidemia, unspecified: Secondary | ICD-10-CM | POA: Insufficient documentation

## 2012-07-16 DIAGNOSIS — E119 Type 2 diabetes mellitus without complications: Secondary | ICD-10-CM | POA: Insufficient documentation

## 2012-07-16 DIAGNOSIS — Z8679 Personal history of other diseases of the circulatory system: Secondary | ICD-10-CM | POA: Insufficient documentation

## 2012-07-16 DIAGNOSIS — IMO0001 Reserved for inherently not codable concepts without codable children: Secondary | ICD-10-CM | POA: Insufficient documentation

## 2012-07-16 DIAGNOSIS — R05 Cough: Secondary | ICD-10-CM | POA: Insufficient documentation

## 2012-07-16 DIAGNOSIS — R1032 Left lower quadrant pain: Secondary | ICD-10-CM

## 2012-07-16 DIAGNOSIS — I10 Essential (primary) hypertension: Secondary | ICD-10-CM | POA: Insufficient documentation

## 2012-07-16 DIAGNOSIS — R509 Fever, unspecified: Secondary | ICD-10-CM | POA: Insufficient documentation

## 2012-07-16 DIAGNOSIS — R059 Cough, unspecified: Secondary | ICD-10-CM | POA: Insufficient documentation

## 2012-07-16 DIAGNOSIS — Z794 Long term (current) use of insulin: Secondary | ICD-10-CM | POA: Insufficient documentation

## 2012-07-16 DIAGNOSIS — Z87891 Personal history of nicotine dependence: Secondary | ICD-10-CM | POA: Insufficient documentation

## 2012-07-16 DIAGNOSIS — R110 Nausea: Secondary | ICD-10-CM | POA: Insufficient documentation

## 2012-07-16 LAB — CBC WITH DIFFERENTIAL/PLATELET
Basophils Absolute: 0 K/uL (ref 0.0–0.1)
Basophils Relative: 0 % (ref 0–1)
Eosinophils Absolute: 0 K/uL (ref 0.0–0.7)
Eosinophils Relative: 0 % (ref 0–5)
HCT: 26.9 % — ABNORMAL LOW (ref 36.0–46.0)
Hemoglobin: 9.1 g/dL — ABNORMAL LOW (ref 12.0–15.0)
Lymphocytes Relative: 5 % — ABNORMAL LOW (ref 12–46)
Lymphs Abs: 0.8 K/uL (ref 0.7–4.0)
MCH: 28.5 pg (ref 26.0–34.0)
MCHC: 33.8 g/dL (ref 30.0–36.0)
MCV: 84.3 fL (ref 78.0–100.0)
Monocytes Absolute: 2.1 K/uL — ABNORMAL HIGH (ref 0.1–1.0)
Monocytes Relative: 13 % — ABNORMAL HIGH (ref 3–12)
Neutro Abs: 13.4 K/uL — ABNORMAL HIGH (ref 1.7–7.7)
Neutrophils Relative %: 82 % — ABNORMAL HIGH (ref 43–77)
Platelets: 327 K/uL (ref 150–400)
RBC: 3.19 MIL/uL — ABNORMAL LOW (ref 3.87–5.11)
RDW: 14.3 % (ref 11.5–15.5)
WBC: 16.3 K/uL — ABNORMAL HIGH (ref 4.0–10.5)

## 2012-07-16 MED ORDER — ACETAMINOPHEN 500 MG PO TABS
1000.0000 mg | ORAL_TABLET | Freq: Once | ORAL | Status: AC
  Administered 2012-07-17: 1000 mg via ORAL
  Filled 2012-07-16: qty 2

## 2012-07-16 MED ORDER — PANTOPRAZOLE SODIUM 40 MG IV SOLR
40.0000 mg | Freq: Once | INTRAVENOUS | Status: AC
  Administered 2012-07-16: 40 mg via INTRAVENOUS
  Filled 2012-07-16: qty 40

## 2012-07-16 MED ORDER — SODIUM CHLORIDE 0.9 % IV SOLN: Freq: Once | INTRAVENOUS | Status: DC

## 2012-07-16 MED ORDER — SODIUM CHLORIDE 0.9 % IV BOLUS (SEPSIS)
1000.0000 mL | INTRAVENOUS | Status: AC
  Administered 2012-07-16: 1000 mL via INTRAVENOUS

## 2012-07-16 MED ORDER — ONDANSETRON HCL 4 MG/2ML IJ SOLN
4.0000 mg | Freq: Once | INTRAMUSCULAR | Status: AC
  Administered 2012-07-17: 4 mg via INTRAVENOUS
  Filled 2012-07-16: qty 2

## 2012-07-16 NOTE — ED Provider Notes (Signed)
History  This chart was scribed for [employer]. [name], MD by Jenne Campus, ED Scribe. This patient was seen in room APA03/APA03 and the patient's care was started at 11:01 PM.  CSN: [id number] Arrival date & time 07/16/12  2137   First MD Initiated Contact with Patient 07/16/12 2301     Chief Complaint  Patient presents with  . Generalized Body Aches    The history is provided by the patient. No language interpreter was used.    HPI Comments: Laura Best is a 72 y.o. female who presents to the Emergency Department complaining of 3 weeks of constant, waxing and waning fever with associated myalgias in her extremities, cough and nausea. She denies nausea currently and reports that she has been taking tylenol with mild improvement. She also reports abdominal pain from a recently diagnosed blockage in her kidney by a urologist in [place] but denies any further treatment plan. She denies emesis and diarrhea as associated symptoms. She has a h/o DM and HTN.   PCP is with [employer] [facility], Dr. Buelah Manis. Has an appointment in one week. Last appointment was "a while ago". Has a home health care nurse but is unclear when the last visit was.  Past Medical History  Diagnosis Date  . Diabetes mellitus   . Hypertension   . Hyperlipidemia   . Tricuspid regurgitation     Echo 05/02/06-Nml LV. Mild MR. Mod TR  . Osteoporosis    Past Surgical History  Procedure Laterality Date  . Heart chamber revision      patched hole --1993  . Tubal ligation     Family History  Problem Relation Age of Onset  . Cancer Father   . Diabetes Father   . Cancer Brother     Brain  . Cancer Sister     abdominal fat   History  Substance Use Topics  . Smoking status: Former Research scientist (life sciences)  . Smokeless tobacco: Not on file  . Alcohol Use: No   No OB history provided.   Review of Systems  Constitutional: Positive for fever. Negative for chills.       10 systems reviewed and are negative for acute change except as  noted in the HPI  HENT: Negative for congestion.   Eyes: Negative for discharge and redness.  Respiratory: Positive for cough. Negative for shortness of breath.   Cardiovascular: Negative for chest pain.  Gastrointestinal: Positive for nausea and abdominal pain. Negative for vomiting.  Musculoskeletal: Positive for myalgias. Negative for back pain.  Skin: Negative for rash.  Neurological: Negative for syncope, numbness and headaches.  Psychiatric/Behavioral:       No behavior change    Allergies  Aspirin and Metformin and related  Home Medications   Current Outpatient Rx  Name  Route  Sig  Dispense  Refill  . acetaminophen (TYLENOL) 500 MG tablet   Oral   Take 500 mg by mouth every 6 (six) hours as needed for pain.         Marland Kitchen amLODipine (NORVASC) 10 MG tablet   Oral   Take 1 tablet (10 mg total) by mouth daily.   90 tablet   1   . cloNIDine (CATAPRES - DOSED IN MG/24 HR) 0.1 mg/24hr patch   Transdermal   Place 2 patches onto the skin once a week. APPLIED ON TUESDAYS         . glipiZIDE (GLUCOTROL) 5 MG tablet   Oral   Take 1 tablet (5 mg total) by  mouth daily.   30 tablet   2   . hydrochlorothiazide (MICROZIDE) 12.5 MG capsule   Oral   Take 1 capsule (12.5 mg total) by mouth daily.   30 capsule   6   . insulin glargine (LANTUS) 100 UNIT/ML injection   Subcutaneous   Inject 0.15 mLs (15 Units total) into the skin every morning.   10 mL   2   . lisinopril (PRINIVIL,ZESTRIL) 20 MG tablet   Oral   Take 20-40 mg by mouth 2 (two) times daily. TAKES TWO TABLETS IN THE MORNING AND ONE TABLET AT BEDTIME          Triage Vitals: BP 138/36  Pulse 72  Temp(Src) 98.6 F (37 C) (Oral)  Resp 20  Ht 5\' 3"  (1.6 m)  Wt 140 lb (63.504 kg)  BMI 24.81 kg/m2  SpO2 100%  Physical Exam  Nursing note and vitals reviewed. Constitutional: She is oriented to person, place, and time. She appears well-developed and well-nourished. No distress.  HENT:  Head: Normocephalic  and atraumatic.  Eyes: Conjunctivae and EOM are normal.  Neck: Neck supple. No tracheal deviation present.  Cardiovascular: Normal rate and regular rhythm.   No murmur heard. Pulmonary/Chest: Effort normal. No respiratory distress. She has wheezes (diffuse expiratory wheezes).  Abdominal: Soft. There is no tenderness.  Musculoskeletal: Normal range of motion.  Neurological: She is alert and oriented to person, place, and time.  Skin: Skin is warm and dry.  Psychiatric: She has a normal mood and affect. Her behavior is normal.    ED Course  Procedures (including critical care time) Results for orders placed during the hospital encounter of 07/16/12  CBC WITH DIFFERENTIAL      Result Value Range   WBC 16.3 (*) 4.0 - 10.5 K/uL   RBC 3.19 (*) 3.87 - 5.11 MIL/uL   Hemoglobin 9.1 (*) 12.0 - 15.0 g/dL   HCT 26.9 (*) 36.0 - 46.0 %   MCV 84.3  78.0 - 100.0 fL   MCH 28.5  26.0 - 34.0 pg   MCHC 33.8  30.0 - 36.0 g/dL   RDW 14.3  11.5 - 15.5 %   Platelets 327  150 - 400 K/uL   Neutrophils Relative % 82 (*) 43 - 77 %   Neutro Abs 13.4 (*) 1.7 - 7.7 K/uL   Lymphocytes Relative 5 (*) 12 - 46 %   Lymphs Abs 0.8  0.7 - 4.0 K/uL   Monocytes Relative 13 (*) 3 - 12 %   Monocytes Absolute 2.1 (*) 0.1 - 1.0 K/uL   Eosinophils Relative 0  0 - 5 %   Eosinophils Absolute 0.0  0.0 - 0.7 K/uL   Basophils Relative 0  0 - 1 %   Basophils Absolute 0.0  0.0 - 0.1 K/uL  BASIC METABOLIC PANEL      Result Value Range   Sodium 130 (*) 135 - 145 mEq/L   Potassium 4.5  3.5 - 5.1 mEq/L   Chloride 93 (*) 96 - 112 mEq/L   CO2 23  19 - 32 mEq/L   Glucose, Bld 92  70 - 99 mg/dL   BUN 48 (*) 6 - 23 mg/dL   Creatinine, Ser 2.01 (*) 0.50 - 1.10 mg/dL   Calcium 9.0  8.4 - 10.5 mg/dL   GFR calc non Af Amer 24 (*) >90 mL/min   GFR calc Af Amer 28 (*) >90 mL/min  URINALYSIS, ROUTINE W REFLEX MICROSCOPIC      Result Value  Range   Color, Urine YELLOW  YELLOW   APPearance CLEAR  CLEAR   Specific Gravity, Urine  1.020  1.005 - 1.030   pH 5.0  5.0 - 8.0   Glucose, UA NEGATIVE  NEGATIVE mg/dL   Hgb urine dipstick TRACE (*) NEGATIVE   Bilirubin Urine NEGATIVE  NEGATIVE   Ketones, ur NEGATIVE  NEGATIVE mg/dL   Protein, ur 30 (*) NEGATIVE mg/dL   Urobilinogen, UA 0.2  0.0 - 1.0 mg/dL   Nitrite NEGATIVE  NEGATIVE   Leukocytes, UA NEGATIVE  NEGATIVE  URINE MICROSCOPIC-ADD ON      Result Value Range   Squamous Epithelial / LPF FEW (*) RARE   WBC, UA 0-2  <3 WBC/hpf   RBC / HPF 0-2  <3 RBC/hpf   Bacteria, UA FEW (*) RARE   Casts HYALINE CASTS (*) NEGATIVE   Dg Abd Acute W/chest  07/17/2012   *RADIOLOGY REPORT*  Clinical Data: Left lower quadrant pain.  Nausea, fever.  ACUTE ABDOMEN SERIES (ABDOMEN 2 VIEW & CHEST 1 VIEW)  Comparison: Chest x-ray 09/08/2011  Findings: There is hyperinflation of the lungs compatible with COPD.  Prior median sternotomy and CABG.  Heart is borderline in size.  No confluent opacities or effusions.  Mild gaseous distention of the transverse colon.  No small bowel distention to suggest obstruction.  No free air.  No organomegaly or suspicious calcification.  No acute bony abnormality.  IMPRESSION: Suspect mild ileus.  COPD, cardiomegaly.   Original Report Authenticated By: Rolm Baptise, M.D.      Medications  sodium chloride 0.9 % bolus 1,000 mL (not administered)  0.9 %  sodium chloride infusion (not administered)  pantoprazole (PROTONIX) injection 40 mg (not administered)  ondansetron (ZOFRAN) injection 4 mg (not administered)    DIAGNOSTIC STUDIES: Oxygen Saturation is 100% on room air, normal by my interpretation.    COORDINATION OF CARE: 11:35 PM-Discussed treatment plan which includes medications, CXR, CBC panel, BMP and UA with pt at bedside and pt agreed to plan.   0159 Patient feeling better. Has taken PO fluids. Reviewed results with the patient.  MDM  Patient with LLQ pain intermittent for three weeks and fevers today. Labs are normal, urine normal. Abdominal  series with chest shows what might be a mild ileus however patient is not having trouble eating or going to the bathroom. She will keep her appointment with Dr. Vashti Hey stable in ED with no significant deterioration in condition.The patient appears reasonably screened and/or stabilized for discharge and I doubt any other medical condition or other [facility] requiring further screening, evaluation, or treatment in the ED at this time prior to discharge.  I personally performed the services described in this documentation, which was scribed in my presence. The recorded information has been reviewed and considered.   MDM Reviewed: nursing note and vitals Interpretation: labs and x-ray       Gypsy Balsam. [name], MD 07/17/12 [id number]

## 2012-07-16 NOTE — ED Notes (Signed)
Generalized body aches with nausea for the past 3 weeks, states she feels like she cannot get rid of symptoms

## 2012-07-17 LAB — URINE MICROSCOPIC-ADD ON

## 2012-07-17 LAB — URINALYSIS, ROUTINE W REFLEX MICROSCOPIC
Nitrite: NEGATIVE
Specific Gravity, Urine: 1.02 (ref 1.005–1.030)
Urobilinogen, UA: 0.2 mg/dL (ref 0.0–1.0)

## 2012-07-17 LAB — BASIC METABOLIC PANEL
GFR calc Af Amer: 28 mL/min — ABNORMAL LOW (ref >90)
GFR calc non Af Amer: 24 mL/min — ABNORMAL LOW (ref >90)
Potassium: 4.5 mEq/L (ref 3.5–5.1)
Sodium: 130 mEq/L — ABNORMAL LOW (ref 135–145)

## 2012-07-19 ENCOUNTER — Ambulatory Visit (INDEPENDENT_AMBULATORY_CARE_PROVIDER_SITE_OTHER): Payer: Medicare Other | Admitting: Family Medicine

## 2012-07-19 ENCOUNTER — Emergency Department (HOSPITAL_COMMUNITY): Payer: Medicare Other

## 2012-07-19 ENCOUNTER — Encounter: Payer: Self-pay | Admitting: Family Medicine

## 2012-07-19 ENCOUNTER — Inpatient Hospital Stay (HOSPITAL_COMMUNITY)
Admission: EM | Admit: 2012-07-19 | Discharge: 2012-07-25 | DRG: 339 | Disposition: A | Discharge status: Home Health | Payer: Medicare Other | Attending: Internal Medicine | Admitting: Internal Medicine

## 2012-07-19 VITALS — BP 130/70 | HR 68 | Temp 98.5°F | Resp 20

## 2012-07-19 DIAGNOSIS — D72829 Elevated white blood cell count, unspecified: Secondary | ICD-10-CM

## 2012-07-19 DIAGNOSIS — R0602 Shortness of breath: Secondary | ICD-10-CM

## 2012-07-19 DIAGNOSIS — Q438 Other specified congenital malformations of intestine: Secondary | ICD-10-CM

## 2012-07-19 DIAGNOSIS — D649 Anemia, unspecified: Secondary | ICD-10-CM

## 2012-07-19 DIAGNOSIS — Z79899 Other long term (current) drug therapy: Secondary | ICD-10-CM

## 2012-07-19 DIAGNOSIS — E785 Hyperlipidemia, unspecified: Secondary | ICD-10-CM

## 2012-07-19 DIAGNOSIS — R112 Nausea with vomiting, unspecified: Secondary | ICD-10-CM | POA: Diagnosis not present

## 2012-07-19 DIAGNOSIS — I160 Hypertensive urgency: Secondary | ICD-10-CM

## 2012-07-19 DIAGNOSIS — K5900 Constipation, unspecified: Secondary | ICD-10-CM | POA: Diagnosis present

## 2012-07-19 DIAGNOSIS — N179 Acute kidney failure, unspecified: Secondary | ICD-10-CM

## 2012-07-19 DIAGNOSIS — R109 Unspecified abdominal pain: Secondary | ICD-10-CM

## 2012-07-19 DIAGNOSIS — I498 Other specified cardiac arrhythmias: Secondary | ICD-10-CM | POA: Diagnosis present

## 2012-07-19 DIAGNOSIS — IMO0001 Reserved for inherently not codable concepts without codable children: Secondary | ICD-10-CM | POA: Diagnosis present

## 2012-07-19 DIAGNOSIS — R509 Fever, unspecified: Secondary | ICD-10-CM

## 2012-07-19 DIAGNOSIS — K3533 Acute appendicitis with perforation and localized peritonitis, with abscess: Principal | ICD-10-CM | POA: Diagnosis present

## 2012-07-19 DIAGNOSIS — E1165 Type 2 diabetes mellitus with hyperglycemia: Secondary | ICD-10-CM

## 2012-07-19 DIAGNOSIS — Z87891 Personal history of nicotine dependence: Secondary | ICD-10-CM

## 2012-07-19 DIAGNOSIS — I10 Essential (primary) hypertension: Secondary | ICD-10-CM | POA: Diagnosis present

## 2012-07-19 DIAGNOSIS — E875 Hyperkalemia: Secondary | ICD-10-CM | POA: Diagnosis present

## 2012-07-19 DIAGNOSIS — T360X5A Adverse effect of penicillins, initial encounter: Secondary | ICD-10-CM | POA: Diagnosis not present

## 2012-07-19 DIAGNOSIS — I129 Hypertensive chronic kidney disease with stage 1 through stage 4 chronic kidney disease, or unspecified chronic kidney disease: Secondary | ICD-10-CM | POA: Diagnosis present

## 2012-07-19 DIAGNOSIS — I5030 Unspecified diastolic (congestive) heart failure: Secondary | ICD-10-CM | POA: Diagnosis present

## 2012-07-19 DIAGNOSIS — M810 Age-related osteoporosis without current pathological fracture: Secondary | ICD-10-CM | POA: Diagnosis present

## 2012-07-19 DIAGNOSIS — J449 Chronic obstructive pulmonary disease, unspecified: Secondary | ICD-10-CM

## 2012-07-19 DIAGNOSIS — K651 Peritoneal abscess: Secondary | ICD-10-CM

## 2012-07-19 DIAGNOSIS — E876 Hypokalemia: Secondary | ICD-10-CM

## 2012-07-19 DIAGNOSIS — R1032 Left lower quadrant pain: Secondary | ICD-10-CM

## 2012-07-19 DIAGNOSIS — I5031 Acute diastolic (congestive) heart failure: Secondary | ICD-10-CM

## 2012-07-19 DIAGNOSIS — E1121 Type 2 diabetes mellitus with diabetic nephropathy: Secondary | ICD-10-CM | POA: Diagnosis present

## 2012-07-19 DIAGNOSIS — K352 Acute appendicitis with generalized peritonitis, without abscess: Secondary | ICD-10-CM

## 2012-07-19 DIAGNOSIS — I272 Pulmonary hypertension, unspecified: Secondary | ICD-10-CM

## 2012-07-19 DIAGNOSIS — N390 Urinary tract infection, site not specified: Secondary | ICD-10-CM

## 2012-07-19 DIAGNOSIS — N189 Chronic kidney disease, unspecified: Secondary | ICD-10-CM | POA: Diagnosis present

## 2012-07-19 LAB — CBC WITH DIFFERENTIAL/PLATELET
Basophils Absolute: 0 10*3/uL (ref 0.0–0.1)
Basophils Relative: 0 % (ref 0–1)
Eosinophils Absolute: 0.1 10*3/uL (ref 0.0–0.7)
Eosinophils Relative: 0 % (ref 0–5)
MCH: 27.5 pg (ref 26.0–34.0)
MCHC: 32.3 g/dL (ref 30.0–36.0)
MCV: 85 fL (ref 78.0–100.0)
Neutrophils Relative %: 85 % — ABNORMAL HIGH (ref 43–77)
Platelets: 412 10*3/uL — ABNORMAL HIGH (ref 150–400)
RBC: 3.06 MIL/uL — ABNORMAL LOW (ref 3.87–5.11)
RDW: 15 % (ref 11.5–15.5)

## 2012-07-19 LAB — URINALYSIS, MICROSCOPIC ONLY
Casts: NONE SEEN
Crystals: NONE SEEN

## 2012-07-19 LAB — OCCULT BLOOD, POC DEVICE: Fecal Occult Bld: NEGATIVE

## 2012-07-19 LAB — CBC W/MCH & 3 PART DIFF
HCT: 28.5 % — ABNORMAL LOW (ref 36.0–46.0)
Hemoglobin: 9.3 g/dL — ABNORMAL LOW (ref 12.0–15.0)
Lymphs Abs: 1.1 10*3/uL (ref 0.7–4.0)
MCH: 28.2 pg (ref 26.0–34.0)
MCHC: 32.6 g/dL (ref 30.0–36.0)
Neutro Abs: 16.2 10*3/uL — ABNORMAL HIGH (ref 1.7–7.7)
Neutrophils Relative %: 83 % — ABNORMAL HIGH (ref 43–77)
RBC: 3.3 MIL/uL — ABNORMAL LOW (ref 3.87–5.11)
WBC mixed population %: 11 % (ref 3–18)
WBC mixed population: 2.2 10*3/uL — ABNORMAL HIGH (ref 0.1–1.8)

## 2012-07-19 LAB — GLUCOSE, CAPILLARY
Glucose-Capillary: 63 mg/dL — ABNORMAL LOW (ref 70–99)
Glucose-Capillary: 63 mg/dL — ABNORMAL LOW (ref 70–99)

## 2012-07-19 LAB — CBC
HCT: 25 % — ABNORMAL LOW (ref 36.0–46.0)
Hemoglobin: 8.2 g/dL — ABNORMAL LOW (ref 12.0–15.0)
MCH: 28.1 pg (ref 26.0–34.0)
MCV: 85.6 fL (ref 78.0–100.0)
RBC: 2.92 MIL/uL — ABNORMAL LOW (ref 3.87–5.11)
WBC: 17.7 10*3/uL — ABNORMAL HIGH (ref 4.0–10.5)

## 2012-07-19 LAB — URINALYSIS, ROUTINE W REFLEX MICROSCOPIC
Glucose, UA: NEGATIVE mg/dL
Leukocytes, UA: NEGATIVE
Leukocytes, UA: NEGATIVE
Nitrite: NEGATIVE
Protein, ur: 30 mg/dL — AB
Specific Gravity, Urine: 1.02 (ref 1.005–1.030)
Specific Gravity, Urine: 1.017 (ref 1.005–1.030)
Urobilinogen, UA: 0.2 mg/dL (ref 0.0–1.0)
pH: 5 (ref 5.0–8.0)

## 2012-07-19 LAB — URINE MICROSCOPIC-ADD ON

## 2012-07-19 LAB — COMPREHENSIVE METABOLIC PANEL
ALT: 32 U/L (ref 0–35)
AST: 19 U/L (ref 0–37)
Albumin: 2.4 g/dL — ABNORMAL LOW (ref 3.5–5.2)
Alkaline Phosphatase: 370 U/L — ABNORMAL HIGH (ref 39–117)
Calcium: 8.7 mg/dL (ref 8.4–10.5)
GFR calc Af Amer: 28 mL/min — ABNORMAL LOW (ref >90)
Glucose, Bld: 134 mg/dL — ABNORMAL HIGH (ref 70–99)
Potassium: 4.8 mEq/L (ref 3.5–5.1)
Sodium: 134 mEq/L — ABNORMAL LOW (ref 135–145)
Total Protein: 7.4 g/dL (ref 6.0–8.3)

## 2012-07-19 LAB — CREATININE, SERUM: GFR calc Af Amer: 28 mL/min — ABNORMAL LOW (ref >90)

## 2012-07-19 LAB — CG4 I-STAT (LACTIC ACID): Lactic Acid, Venous: 1.46 mmol/L (ref 0.5–2.2)

## 2012-07-19 LAB — TROPONIN I: Troponin I: <0.3 ng/mL (ref <0.30)

## 2012-07-19 MED ORDER — MIDAZOLAM HCL 2 MG/2ML IJ SOLN
INTRAMUSCULAR | Status: AC | PRN
  Administered 2012-07-19: 2 mg via INTRAVENOUS

## 2012-07-19 MED ORDER — MIDAZOLAM HCL 2 MG/2ML IJ SOLN
INTRAMUSCULAR | Status: AC
  Filled 2012-07-19: qty 6

## 2012-07-19 MED ORDER — SODIUM CHLORIDE 0.9 % IV SOLN
1.0000 g | INTRAVENOUS | Status: DC
  Administered 2012-07-19 – 2012-07-22 (×4): 1 g via INTRAVENOUS
  Filled 2012-07-19 (×6): qty 1

## 2012-07-19 MED ORDER — ONDANSETRON HCL 4 MG/2ML IJ SOLN
4.0000 mg | Freq: Once | INTRAMUSCULAR | Status: AC
  Administered 2012-07-19: 4 mg via INTRAVENOUS
  Filled 2012-07-19: qty 2

## 2012-07-19 MED ORDER — SODIUM CHLORIDE 0.9 % IJ SOLN
3.0000 mL | Freq: Two times a day (BID) | INTRAMUSCULAR | Status: DC
  Administered 2012-07-19 – 2012-07-25 (×9): 3 mL via INTRAVENOUS

## 2012-07-19 MED ORDER — ENOXAPARIN SODIUM 30 MG/0.3ML ~~LOC~~ SOLN
30.0000 mg | SUBCUTANEOUS | Status: DC
  Administered 2012-07-19 – 2012-07-24 (×6): 30 mg via SUBCUTANEOUS
  Filled 2012-07-19 (×7): qty 0.3

## 2012-07-19 MED ORDER — FENTANYL CITRATE 0.05 MG/ML IJ SOLN
INTRAMUSCULAR | Status: AC
  Filled 2012-07-19: qty 4

## 2012-07-19 MED ORDER — SODIUM CHLORIDE 0.9 % IV SOLN: INTRAVENOUS | Status: DC

## 2012-07-19 MED ORDER — VANCOMYCIN HCL IN DEXTROSE 1-5 GM/200ML-% IV SOLN
1000.0000 mg | INTRAVENOUS | Status: DC
  Filled 2012-07-19: qty 200

## 2012-07-19 MED ORDER — ONDANSETRON HCL 4 MG/2ML IJ SOLN: 4.0000 mg | Freq: Three times a day (TID) | INTRAMUSCULAR | Status: AC | PRN

## 2012-07-19 MED ORDER — SODIUM CHLORIDE 0.9 % IV SOLN
INTRAVENOUS | Status: DC
  Administered 2012-07-20: 1000 mL via INTRAVENOUS
  Administered 2012-07-20: 21:00:00 via INTRAVENOUS

## 2012-07-19 MED ORDER — SODIUM CHLORIDE 0.9 % IV SOLN
Freq: Once | INTRAVENOUS | Status: AC
  Administered 2012-07-19: 12:00:00 via INTRAVENOUS

## 2012-07-19 MED ORDER — MORPHINE SULFATE 4 MG/ML IJ SOLN
4.0000 mg | Freq: Once | INTRAMUSCULAR | Status: AC
  Administered 2012-07-19: 4 mg via INTRAVENOUS
  Filled 2012-07-19: qty 1

## 2012-07-19 MED ORDER — HYDROMORPHONE HCL PF 1 MG/ML IJ SOLN
0.5000 mg | INTRAMUSCULAR | Status: DC | PRN
  Administered 2012-07-20 – 2012-07-22 (×2): 0.5 mg via INTRAVENOUS
  Filled 2012-07-19 (×2): qty 1

## 2012-07-19 MED ORDER — INSULIN ASPART 100 UNIT/ML ~~LOC~~ SOLN
0.0000 [IU] | Freq: Three times a day (TID) | SUBCUTANEOUS | Status: DC
  Administered 2012-07-20 – 2012-07-21 (×4): 2 [IU] via SUBCUTANEOUS
  Administered 2012-07-22: 3 [IU] via SUBCUTANEOUS
  Administered 2012-07-22 – 2012-07-23 (×2): 2 [IU] via SUBCUTANEOUS
  Administered 2012-07-23: 3 [IU] via SUBCUTANEOUS
  Administered 2012-07-23: 2 [IU] via SUBCUTANEOUS
  Administered 2012-07-24: 5 [IU] via SUBCUTANEOUS
  Administered 2012-07-24: 2 [IU] via SUBCUTANEOUS
  Administered 2012-07-25: 3 [IU] via SUBCUTANEOUS
  Administered 2012-07-25: 2 [IU] via SUBCUTANEOUS

## 2012-07-19 MED ORDER — PIPERACILLIN-TAZOBACTAM 3.375 G IVPB
3.3750 g | Freq: Once | INTRAVENOUS | Status: AC
  Administered 2012-07-19: 3.375 g via INTRAVENOUS
  Filled 2012-07-19: qty 50

## 2012-07-19 MED ORDER — FENTANYL CITRATE 0.05 MG/ML IJ SOLN
INTRAMUSCULAR | Status: AC | PRN
  Administered 2012-07-19: 25 ug via INTRAVENOUS

## 2012-07-19 MED ORDER — IOHEXOL 300 MG/ML  SOLN: 25.0000 mL | INTRAMUSCULAR | Status: AC

## 2012-07-19 MED ORDER — SODIUM CHLORIDE 0.9 % IV BOLUS (SEPSIS): 500.0000 mL | Freq: Once | INTRAVENOUS | Status: DC

## 2012-07-19 NOTE — Assessment & Plan Note (Signed)
Acute renal failure noted on labs 3 days ago this will need to be repeated to see if this has worsened

## 2012-07-19 NOTE — Assessment & Plan Note (Addendum)
She has known pulmonary hypertension hypertension and COPD I  am seeing change from previous examination possible with her fever and leukocytosis that she has an underlying pneumonia versus abdominal pathology is causing the shortness of breath. She'll be transferred to [facility] for further workup

## 2012-07-19 NOTE — Progress Notes (Signed)
This patient has a large left sided. Ruptured appendix with a large peri-appendiceal abscess.  This is amenable to percutaneous drainage.  I have spoken with IR.  Patient is NPO.  Should get drainage procedure tonight.  Kathryne Eriksson. Dahlia Bailiff, MD, [place] [phone] [facility] Surgery

## 2012-07-19 NOTE — Progress Notes (Addendum)
  Subjective:    Patient ID: Laura Best, female    DOB: [birth year], 72 y.o.   MRN: [id number]  HPI Pt in ED 6/24 with fever, shakes, abd pain worsening past 3 weeks, has also had chest pain and SOB. Unable to have BM > 1 week CBG have been 180-200's past few days, BP fluctuate 118-170's/ 60-70's No new medication changes UA in office is negative  CXR  in emergency room showed mild illeus and COPD. White blood cell count was elevated to 16,000 she was in acute renal failure with a creatinine of 2.0 she also had hyponatremia to 130. No new medications were prescribed and she was sent home to followup in the office. She states she feels sick all over and is not getting better she continues to have fever and she cannot control her shakes. She's also had sharp chest pain a few days and shortness of breath but none today.   Review of Systems - pe rabove  GEN- +fatigue, fever, weight loss,weakness, recent illness HEENT- denies eye drainage, change in vision, nasal discharge, CVS- denies chest pain, palpitations RESP- denies SOB, +cough, wheeze ABD- denies N/V, +change in stools,+ abd pain GU- denies dysuria, hematuria, dribbling, incontinence MSK- denies joint pain, muscle aches, injury Neuro- denies headache, dizziness, syncope, seizure activity       Objective:   Physical Exam GEN- NAD, alert and oriented x3, low grade fever, ill appearing HEENT- PERRL, EOMI, non injected sclera, pink conjunctiva, MMM, oropharynx clear Neck- Supple, no LAD CVS- RRR, no murmur RESP-+rhonchi, no rales, few wheeze ABD-decreased BS lower quadrants, TTP diffusely Left > RIght, + rebound, + guarding, ND EXT- No edema Pulses- Radial, DP- 2+ NEURO-CNII-XII  grossly in tact, shaking   EKG- NSR, LAD, flat T waves in V1 inverted T waves V2, mild elevation ST in V3     Assessment & Plan:

## 2012-07-19 NOTE — ED Notes (Signed)
Pt placed on 2 L [place].  

## 2012-07-19 NOTE — ED Provider Notes (Signed)
History    CSN: [id number] Arrival date & time 07/19/12  1109  First MD Initiated Contact with Patient 07/19/12 1110     Chief Complaint  Patient presents with  . Abdominal Pain   (Consider location/radiation/quality/duration/timing/severity/associated sxs/prior Treatment) HPI Comments: Patient presents from PCPs office with a two-week history of left-sided abdominal pain. Is associated with nausea and constipation. She states she's not had a bowel movement in one week. She denies any fevers, chest pain, shortness of breath, chills. She is not sure whether she was treated for UTI. She was seen at [facility] 3 days ago for similar complaints and found to have renal failure. She is not have panic this in the past. She denies any blood in her stools. She endorses poor appetite and nausea but vomiting. The pain comes and goes in waves.  The history is provided by the patient and the EMS personnel.   Past Medical History  Diagnosis Date  . Diabetes mellitus   . Hypertension   . Hyperlipidemia   . Tricuspid regurgitation     Echo 05/02/06-Nml LV. Mild MR. Mod TR  . Osteoporosis   . Diastolic heart failure    Past Surgical History  Procedure Laterality Date  . Heart chamber revision      patched hole --1993  . Tubal ligation     Family History  Problem Relation Age of Onset  . Cancer Father   . Diabetes Father   . Cancer Brother     Brain  . Cancer Sister     abdominal fat   History  Substance Use Topics  . Smoking status: Former Research scientist (life sciences)  . Smokeless tobacco: Not on file  . Alcohol Use: No   OB History   Grav Para Term Preterm Abortions TAB SAB Ect Mult Living                 Review of Systems  Constitutional: Positive for activity change, appetite change and fatigue. Negative for fever.  HENT: Negative for congestion and rhinorrhea.   Eyes: Negative for visual disturbance.  Respiratory: Negative for cough, chest tightness and shortness of breath.   Cardiovascular:  Negative for chest pain.  Gastrointestinal: Positive for nausea, abdominal pain and constipation. Negative for vomiting and diarrhea.  Genitourinary: Negative for dysuria, hematuria, vaginal bleeding and vaginal discharge.  Musculoskeletal: Negative for back pain.  Skin: Negative for rash.  Neurological: Positive for weakness. Negative for dizziness and headaches.  A complete 10 system review of systems was obtained and all systems are negative except as noted in the HPI and PMH.    Allergies  Aspirin and Metformin and related  Home Medications   Current Outpatient Rx  Name  Route  Sig  Dispense  Refill  . acetaminophen (TYLENOL) 500 MG tablet   Oral   Take 500 mg by mouth every 6 (six) hours as needed for pain.         Marland Kitchen amLODipine (NORVASC) 10 MG tablet   Oral   Take 1 tablet (10 mg total) by mouth daily.   90 tablet   1   . cloNIDine (CATAPRES - DOSED IN MG/24 HR) 0.1 mg/24hr patch   Transdermal   Place 2 patches onto the skin once a week. APPLIED ON TUESDAYS         . glipiZIDE (GLUCOTROL) 5 MG tablet   Oral   Take 1 tablet (5 mg total) by mouth daily.   30 tablet   2   .  hydrochlorothiazide (MICROZIDE) 12.5 MG capsule   Oral   Take 1 capsule (12.5 mg total) by mouth daily.   30 capsule   6   . insulin glargine (LANTUS) 100 UNIT/ML injection   Subcutaneous   Inject 0.15 mLs (15 Units total) into the skin every morning.   10 mL   2   . lisinopril (PRINIVIL,ZESTRIL) 20 MG tablet   Oral   Take 20-40 mg by mouth 2 (two) times daily. TAKES TWO TABLETS IN THE MORNING AND ONE TABLET AT BEDTIME          BP 133/53  Pulse 75  Temp(Src) 100.6 F (38.1 C) (Oral)  Resp 25  SpO2 97% Physical Exam  Constitutional: She is oriented to person, place, and time. She appears well-developed and well-nourished. No distress.  HENT:  Head: Normocephalic and atraumatic.  Mouth/Throat: Oropharynx is clear and moist. No oropharyngeal exudate.  Eyes: Conjunctivae and EOM  are normal. Pupils are equal, round, and reactive to light.  Neck: Normal range of motion. Neck supple.  Cardiovascular: Normal rate, regular rhythm and normal heart sounds.   No murmur heard. Intact DP, PT, femoral, radial pulses bilaterally  Pulmonary/Chest: Effort normal and breath sounds normal. No respiratory distress.  Abdominal: Soft. She exhibits distension. There is tenderness. There is guarding.  Distended and firm abdomen with diffuse tenderness, Soft with guarding in the left lower quadrant  Genitourinary: Guaiac negative stool.  No hemorrhoids, no fecal impaction  Musculoskeletal: Normal range of motion. She exhibits no edema and no tenderness.  Neurological: She is alert and oriented to person, place, and time. No cranial nerve deficit. She exhibits normal muscle tone. Coordination normal.  Skin: Skin is warm.    ED Course  Procedures (including critical care time) Labs Reviewed  CBC WITH DIFFERENTIAL - Abnormal; Notable for the following:    WBC 20.2 (*)    RBC 3.06 (*)    Hemoglobin 8.4 (*)    HCT 26.0 (*)    Platelets 412 (*)    Neutrophils Relative % 85 (*)    Neutro Abs 17.1 (*)    Lymphocytes Relative 6 (*)    Monocytes Absolute 1.8 (*)    All other components within normal limits  COMPREHENSIVE METABOLIC PANEL - Abnormal; Notable for the following:    Sodium 134 (*)    Glucose, Bld 134 (*)    BUN 46 (*)    Creatinine, Ser 1.98 (*)    Albumin 2.4 (*)    Alkaline Phosphatase 370 (*)    GFR calc non Af Amer 24 (*)    GFR calc Af Amer 28 (*)    All other components within normal limits  URINALYSIS, ROUTINE W REFLEX MICROSCOPIC - Abnormal; Notable for the following:    Protein, ur 30 (*)    All other components within normal limits  LIPASE, BLOOD  TROPONIN I  URINE MICROSCOPIC-ADD ON  CG4 I-STAT (LACTIC ACID)  OCCULT BLOOD, POC DEVICE   Ct Abdomen Pelvis Wo Contrast  07/19/2012   *RADIOLOGY REPORT*  Clinical Data: Abdominal pain and fever.  CT ABDOMEN  AND PELVIS WITHOUT CONTRAST  Technique:  Multidetector CT imaging of the abdomen and pelvis was performed following the standard protocol without intravenous contrast.  Comparison: Radiographs dated 07/19/2012  Findings: There is a 10 x 8.5 x 6.5 cm periappendiceal abscess in the left lower quadrant due to perforated appendicitis.  There are three fecaliths seen within the irregular abscess.  The cecum lies in the left mid  abdomen.  There is thickening of the mucosa of the adjacent terminal ileum.  The liver, biliary tree, spleen, pancreas, adrenal glands, and kidneys are normal.  There is no free intraperitoneal air and only a tiny amount of free fluid in the pelvic cul-de-sac.  The uterus and ovaries are normal.  No acute osseous abnormality.  IMPRESSION: Perforated appendicitis with a large periappendiceal abscess.  The appendix and abscess are in the LEFT lower quadrant.   Original Report Authenticated By: Lorriane Shire, M.D.   Dg Abd Acute W/chest  07/19/2012   *RADIOLOGY REPORT*  Clinical Data: Abdominal pain.  ACUTE ABDOMEN SERIES (ABDOMEN 2 VIEW & CHEST 1 VIEW)  Comparison: 07/16/2012 and chest x-ray dated 09/08/2011   Findings: Chronic mild cardiomegaly with chronic pulmonary vascular congestion.  Lungs are hyperinflated consistent with emphysema.  No free air in the abdomen.  There is fairly extensive stool in the nondistended colon.  There is no fecal impaction.  There is fairly extensive air in the nondistended small bowel.  No acute osseous abnormality. Splenic artery calcifications in the left upper quadrant.  IMPRESSION: No acute abnormality of the abdomen or chest.   Original Report Authenticated By: Lorriane Shire, M.D.   No diagnosis found.  MDM  Several week history of left lower quadrant abdominal pain with nausea and constipation. Recent labs show acute renal failure.  Patient with left peritoneal signs on exam. X-rays negative for obstruction or perforation. She is a stable  leukocytosis of 19. Acute renal failure 1.9. Urinalysis is negative. Slightly decreased hemoglobin 8.4 from previous values in the 10 range. She is guaiac negative.   CT scan shows perforated appendicitis with abscess on the left side of her abdomen. IV antibiotic started.  Surgery has seen the patient and arranged a CT guided drainage of her left lower quadrant abscess. They did not plan to take her to the operating room immediately.  Surgery requests a medical admission given her anemia, renal failure, diabetes. Patient remains hemodynamically stable in the ED.   Date: 07/19/2012  Rate: 70  Rhythm: normal sinus rhythm  QRS Axis: normal  Intervals: normal  ST/T Wave abnormalities: nonspecific ST/T changes  Conduction Disutrbances:none  Narrative Interpretation:   Old EKG Reviewed: unchanged BP 111/39  Pulse 50  Temp(Src) 102 F (38.9 C) (Oral)  Resp 12  SpO2 93%        Ezequiel Essex, MD 07/19/12 1811

## 2012-07-19 NOTE — Patient Instructions (Addendum)
ER, r/o pneumonia,evaluate abd pain , Acute renal failure

## 2012-07-19 NOTE — Assessment & Plan Note (Signed)
Patient has some type of infection brewing for white count in the office was 19,000 this is up from 16,003 days ago. Differentials include pneumonia versus diverticulitis or other abdominal infection. Send to emergency room for repeat chest x-ray CT scan IV fluids.

## 2012-07-19 NOTE — Progress Notes (Signed)
ANTIBIOTIC CONSULT NOTE - INITIAL  Pharmacy Consult for Vancomycin Indication: Ruptured appendix with abscess  Allergies  Allergen Reactions  . Aspirin   . Metformin And Related Diarrhea    Patient Measurements:   Wt: 63.5 kg  Vital Signs: Temp: 102 F (38.9 C) (06/27 1600) Temp src: Oral (06/27 1600) BP: 112/39 mmHg (06/27 1735) Pulse Rate: 53 (06/27 1737)  Labs:  Recent Labs  07/16/12 2341 07/19/12 1005 07/19/12 1134  WBC 16.3* 19.5* 20.2*  HGB 9.1* 9.3* 8.4*  PLT 327 387 412*  CREATININE 2.01*  --  1.98*   The CrCl is unknown because both a height and weight (above a minimum accepted value) are required for this calculation. No results found for this basename: VANCOTROUGH, VANCOPEAK, VANCORANDOM, GENTTROUGH, GENTPEAK, GENTRANDOM, TOBRATROUGH, TOBRAPEAK, TOBRARND, AMIKACINPEAK, AMIKACINTROU, AMIKACIN,  in the last 72 hours    Medical History: Past Medical History  Diagnosis Date  . Diabetes mellitus   . Hypertension   . Hyperlipidemia   . Tricuspid regurgitation     Echo 05/02/06-Nml LV. Mild MR. Mod TR  . Osteoporosis   . Diastolic heart failure     Assessment: 72 y/o F who presents from MD office with fever/shakes and found to have ruptured appendix with large abscess that has now been drained (234mL foul smelling purulent fluid). WBC 20.2, Scr 1.98, Tmax 102. Also on ertapenem.   6/27 Blood x 2>> 6/27 Abscess>>  Goal of Therapy:  Vancomycin trough level 15-20 mcg/ml  Plan:  -Start vancomycin 1000 mg IV q24h -Ertapenem per Surgery -Trend WBC, temp, renal function, micro data -Drug levels as indicated  Thank you for allowing me to take part in this patient's care,  Narda Bonds, PharmD Clinical Pharmacist Phone: [phone] Pager: [phone] 07/19/2012 5:49 PM

## 2012-07-19 NOTE — H&P (Signed)
[facility] History and Physical  Laura Best [id number] DOB: 02-06-1940 DOA: 07/19/2012  Referring physician: PCP: [name], PA-C   Chief Complaint:   HPI:  72 y/o female presents to [facility] from her PCPs office with a two-week history of left-sided abdominal pain. Is associated with nausea and constipation. She states she's not had a bowel movement in one week. She denies any fevers, chest pain, shortness of breath, chills. She is not sure whether she was treated for UTI. She was seen at [facility] 3 days ago for similar complaints and found to have renal failure. The LLQ abdominal pain and nausea has worsened today which is why she saw her PCP. No history of gastrointestinal problems in the past. She denies any blood in her stools. She endorses poor appetite and nausea, but no vomiting. Surgery was consulted because her CT scan showed acute appendicitis with perforation and abscess. The patient received IR drain placement in the ED       Review of Systems: negative for the following  Constitutional: Positive for activity change, appetite change and fatigue. Negative for fever.  HENT: Negative for congestion and rhinorrhea.  Eyes: Negative for visual disturbance.  Respiratory: Negative for cough, chest tightness and shortness of breath.  Cardiovascular: Negative for chest pain.  Gastrointestinal: Positive for nausea, abdominal pain and constipation. Negative for vomiting and diarrhea.  Genitourinary: Negative for dysuria, hematuria, vaginal bleeding and vaginal discharge.  Musculoskeletal: Negative for back pain.  Skin: Negative for rash.  Neurological: Positive for weakness. Negative for dizziness and headaches.  A complete 10 system review of systems was obtained and all systems are negative except as noted in the HPI and PMH       Past Medical History  Diagnosis Date  . Diabetes mellitus   . Hypertension   . Hyperlipidemia   . Tricuspid regurgitation      Echo 05/02/06-Nml LV. Mild MR. Mod TR  . Osteoporosis   . Diastolic heart failure      Past Surgical History  Procedure Laterality Date  . Heart chamber revision      patched hole --1993  . Tubal ligation        Social History:  reports that she has quit smoking. She does not have any smokeless tobacco history on file. She reports that she does not drink alcohol or use illicit drugs.     Allergies  Allergen Reactions  . Aspirin   . Metformin And Related Diarrhea    Family History  Problem Relation Age of Onset  . Cancer Father   . Diabetes Father   . Cancer Brother     Brain  . Cancer Sister     abdominal fat     Prior to Admission medications   Medication Sig Start Date End Date Taking? Authorizing Provider  acetaminophen (TYLENOL) 500 MG tablet Take 500 mg by mouth every 6 (six) hours as needed for pain.   Yes Historical Provider, MD  amLODipine (NORVASC) 10 MG tablet Take 1 tablet (10 mg total) by mouth daily. 06/10/12  Yes Lendon Colonel, NP  cloNIDine (CATAPRES - DOSED IN MG/24 HR) 0.1 mg/24hr patch Place 2 patches onto the skin once a week. APPLIED ON TUESDAYS   Yes Historical Provider, MD  glipiZIDE (GLUCOTROL) 5 MG tablet Take 1 tablet (5 mg total) by mouth daily. 06/15/12  Yes Mary B Dixon, PA-C  hydrochlorothiazide (MICROZIDE) 12.5 MG capsule Take 1 capsule (12.5 mg total) by mouth daily. 05/24/12  Yes  Lendon Colonel, NP  insulin glargine (LANTUS) 100 UNIT/ML injection Inject 0.15 mLs (15 Units total) into the skin every morning. 06/15/12  Yes Orlena Sheldon, PA-C  lisinopril (PRINIVIL,ZESTRIL) 20 MG tablet Take 20-40 mg by mouth 2 (two) times daily. TAKES TWO TABLETS IN THE MORNING AND ONE TABLET AT BEDTIME 05/14/12  Yes Lendon Colonel, NP     Physical Exam: Filed Vitals:   07/19/12 1719 07/19/12 1735 07/19/12 1737 07/19/12 1745  BP: 106/38 112/39  111/39  Pulse: 50  53 50  Temp:      TempSrc:      Resp: 16  14 12   SpO2: 94%  93% 93%      Constitutional: Vital signs reviewed. Patient is a well-developed and well-nourished in no acute distress and cooperative with exam. Alert and oriented x3.  Head: Normocephalic and atraumatic  Ear: TM normal bilaterally  Mouth: no erythema or exudates, MMM  Eyes: PERRL, EOMI, conjunctivae normal, No scleral icterus.  Neck: Supple, Trachea midline normal ROM, No JVD, mass, thyromegaly, or carotid bruit present.  Cardiovascular: RRR, S1 normal, S2 normal, no MRG, pulses symmetric and intact bilaterally  Pulmonary/Chest: CTAB, no wheezes, rales, or rhonchi  Distended and firm abdomen with diffuse tenderness, Soft with guarding in the left lower quadrant  GU: no CVA tenderness Musculoskeletal: No joint deformities, erythema, or stiffness, ROM full and no nontender Ext: no edema and no cyanosis, pulses palpable bilaterally (DP and PT)  Hematology: no cervical, inginal, or axillary adenopathy.  Neurological: A&O x3, Strenght is normal and symmetric bilaterally, cranial nerve II-XII are grossly intact, no focal motor deficit, sensory intact to light touch bilaterally.  Skin: Warm, dry and intact. No rash, cyanosis, or clubbing.  Psychiatric: Normal mood and affect. speech and behavior is normal. Judgment and thought content normal. Cognition and memory are normal.       Labs on Admission:    Basic Metabolic Panel:  Recent Labs Lab 07/16/12 2341 07/19/12 1005 07/19/12 1134  NA 130*  --  134*  K 4.5  --  4.8  CL 93*  --  100  CO2 23  --  21  GLUCOSE 92 163* 134*  BUN 48*  --  46*  CREATININE 2.01*  --  1.98*  CALCIUM 9.0  --  8.7   Liver Function Tests:  Recent Labs Lab 07/19/12 1134  AST 19  ALT 32  ALKPHOS 370*  BILITOT 0.3  PROT 7.4  ALBUMIN 2.4*    Recent Labs Lab 07/19/12 1134  LIPASE 14   No results found for this basename: AMMONIA,  in the last 168 hours CBC:  Recent Labs Lab 07/16/12 2341 07/19/12 1005 07/19/12 1134  WBC 16.3* 19.5* 20.2*  NEUTROABS  13.4* 16.2* 17.1*  HGB 9.1* 9.3* 8.4*  HCT 26.9* 28.5* 26.0*  MCV 84.3 86.4 85.0  PLT 327 387 412*   Cardiac Enzymes:  Recent Labs Lab 07/19/12 1134  TROPONINI <0.30    BNP (last 3 results) No results found for this basename: PROBNP,  in the last 8760 hours    CBG: No results found for this basename: GLUCAP,  in the last 168 hours  Radiological Exams on Admission: Ct Abdomen Pelvis Wo Contrast  07/19/2012   *RADIOLOGY REPORT*  Clinical Data: Abdominal pain and fever.  CT ABDOMEN AND PELVIS WITHOUT CONTRAST  Technique:  Multidetector CT imaging of the abdomen and pelvis was performed following the standard protocol without intravenous contrast.  Comparison: Radiographs dated 07/19/2012  Findings:  There is a 10 x 8.5 x 6.5 cm periappendiceal abscess in the left lower quadrant due to perforated appendicitis.  There are three fecaliths seen within the irregular abscess.  The cecum lies in the left mid abdomen.  There is thickening of the mucosa of the adjacent terminal ileum.  The liver, biliary tree, spleen, pancreas, adrenal glands, and kidneys are normal.  There is no free intraperitoneal air and only a tiny amount of free fluid in the pelvic cul-de-sac.  The uterus and ovaries are normal.  No acute osseous abnormality.  IMPRESSION: Perforated appendicitis with a large periappendiceal abscess.  The appendix and abscess are in the LEFT lower quadrant.   Original Report Authenticated By: Lorriane Shire, M.D.   Ct Guided Abscess Drain  07/19/2012   *RADIOLOGY REPORT*  C T ABSCESS DRAINAGE PLACEMENT  Date: 07/19/2012  Clinical History: 72 year old female with perforated appendicitis and left lower quadrant abscess.  Procedures Performed: 1. CT guided placement of a 14-French drain  Interventional Radiologist:  Criselda Peaches, MD  Sedation: Moderate (conscious) sedation was used.  To mg Versed, 25 mcg Fentanyl were administered intravenously.  The patient's vital signs were monitored  continuously by radiology nursing throughout the procedure.  Sedation Time: 10 minutes  Fluoroscopy time: 2 seconds  Contrast volume: None  PROCEDURE/FINDINGS:   Informed consent was obtained from the patient following explanation of the procedure, risks, benefits and alternatives. The patient understands, agrees and consents for the procedure. All questions were addressed. A time out was performed.  Maximal barrier sterile technique utilized including caps, mask, sterile gowns, sterile gloves, large sterile drape, hand hygiene, and betadine skin prep.  A planning axial CT scan was performed.  The left lower quadrant abscess collection was identified.  A suitable skin entry site was selected and marked.  Local anesthesia was achieved by infiltration of 1% lidocaine.  Under CT fluoroscopic guidance, an 18 gauge trocar needle was advanced over the abscess.  A 0.0350 guidewire was then coiled within the abscess collection.  The tract was serially dilated to 14-French and a Lacinda Axon 40 [place] all-purpose drainage catheter was advanced into the abscess.  Approximately 200 ml of foul-smelling purulent fluid was then aspirated.  The drain was secured in place with an O Prolene suture and connected to a suction bulb drainage.  A final axial CT scan demonstrated excellent placement of the drain and significant decrease in the size of the abscess collection.  The patient tolerated the procedure well, there was no immediate complication.  IMPRESSION:  Technically successful placement of a 14-French drainage catheter into the left lower quadrant appendiceal abscess.  Signed,  Criselda Peaches, MD Vascular & Interventional Radiologist [facility] Radiology   Original Report Authenticated By: Jacqulynn Cadet, M.D.   Dg Abd Acute W/chest  07/19/2012   *RADIOLOGY REPORT*  Clinical Data: Abdominal pain.  ACUTE ABDOMEN SERIES (ABDOMEN 2 VIEW & CHEST 1 VIEW)  Comparison: 07/16/2012 and chest x-ray dated 09/08/2011   Findings:  Chronic mild cardiomegaly with chronic pulmonary vascular congestion.  Lungs are hyperinflated consistent with emphysema.  No free air in the abdomen.  There is fairly extensive stool in the nondistended colon.  There is no fecal impaction.  There is fairly extensive air in the nondistended small bowel.  No acute osseous abnormality. Splenic artery calcifications in the left upper quadrant.  IMPRESSION: No acute abnormality of the abdomen or chest.   Original Report Authenticated By: Lorriane Shire, M.D.    EKG: Independently reviewed.  Assessment/Plan Principal Problem:   Abscess, appendix Active Problems:   Hypertension   Diabetes type 2, uncontrolled   Acute perforated appendicitis with abscess on CT scan Surgery following Interventional radiology performed percutaneous drainage in the ED We'll keep n.p.o. On Invanz, add vancomycin until other sources of infection ruled out Blood culture pending Discontinue vancomycin if no growth on blood culture in 24 hours Admit to step down as the patient appears septic  Hypertension Hold all antihypertensive medications given impending septic shock  Acute renal failure Blood pressure was trending down in the ED Received one bolus of 500 cc of normal saline Hold all antihypertensive medications   Code Status:   full Family Communication: bedside Disposition Plan: admit   Time spent: 70 mins   [facility] Pager [phone]  If 7PM-7AM, please contact night-coverage [email or web address] Password [facility] 07/19/2012, 6:19 PM

## 2012-07-19 NOTE — Consult Note (Signed)
Laura Best 01/23/1941  [id number].    Requesting MD: Dr. Wyvonnia Dusky, [place] Chief Complaint/Reason for Consult: Acute perforated appendicitis with abscess HPI: 72 y/o female presents to [facility] from her PCPs office with a two-week history of left-sided abdominal pain. Is associated with nausea and constipation. She states she's not had a bowel movement in one week. She denies any fevers, chest pain, shortness of breath, chills. She is not sure whether she was treated for UTI. She was seen at [facility] 3 days ago for similar complaints and found to have renal failure. The LLQ abdominal pain and nausea has worsened today which is why she saw her PCP.  No history of gastrointestinal problems in the past. She denies any blood in her stools. She endorses poor appetite and nausea, but no vomiting.  We were asked to see the patient because her CT scan showed acute appendicitis with perforation and abscess.  Upon evaluating the patient she had just returned from IR for her perc drain of her abscess.  She is feeling somewhat better.  Her nausea and pain are better controlled.   ROS: All systems reviewed and otherwise negative except for as above  Family History  Problem Relation Age of Onset  . Cancer Father   . Diabetes Father   . Cancer Brother     Brain  . Cancer Sister     abdominal fat    Past Medical History  Diagnosis Date  . Diabetes mellitus   . Hypertension   . Hyperlipidemia   . Tricuspid regurgitation     Echo 05/02/06-Nml LV. Mild MR. Mod TR  . Osteoporosis   . Diastolic heart failure     Past Surgical History  Procedure Laterality Date  . Heart chamber revision      patched hole --1993  . Tubal ligation      Social History:  reports that she has quit smoking. She does not have any smokeless tobacco history on file. She reports that she does not drink alcohol or use illicit drugs.  Allergies:  Allergies  Allergen Reactions  . Aspirin   . Metformin And Related Diarrhea      (Not in a hospital admission)  Blood pressure 106/38, pulse 50, temperature 102 F (38.9 C), temperature source Oral, resp. rate 16, SpO2 94.00%. Physical Exam: General: pleasant, WD/WN white female who is laying in bed in NAD HEENT: head is normocephalic, atraumatic.  Sclera are noninjected.  PERRL.  Ears and nose without any masses or lesions.  Mouth is pink and moist Heart: regular, rate, and rhythm.  No obvious murmurs, gallops, or rubs noted.  Palpable pedal pulses bilaterally Lungs: CTAB, no wheezes or rales noted.  Rhonchi heard in right lung.  Respiratory effort nonlabored Abd: soft, moderately tender of LLQ, perc drain in place with dressing, mild distension, +BS, no masses, hernias, or organomegaly, lower midline scar (tubal ligation) MS: all 4 extremities are symmetrical with no cyanosis, clubbing, or edema. Skin: warm and dry with no masses, lesions, or rashes Psych: A&Ox3 with an appropriate affect.  Results for orders placed during the hospital encounter of 07/19/12 (from the past 48 hour(s))  CBC WITH DIFFERENTIAL     Status: Abnormal   Collection Time    07/19/12 11:34 AM      Result Value Range   WBC 20.2 (*) 4.0 - 10.5 K/uL   RBC 3.06 (*) 3.87 - 5.11 MIL/uL   Hemoglobin 8.4 (*) 12.0 - 15.0 g/dL   HCT 26.0 (*)  36.0 - 46.0 %   MCV 85.0  78.0 - 100.0 fL   MCH 27.5  26.0 - 34.0 pg   MCHC 32.3  30.0 - 36.0 g/dL   RDW 15.0  11.5 - 15.5 %   Platelets 412 (*) 150 - 400 K/uL   Neutrophils Relative % 85 (*) 43 - 77 %   Neutro Abs 17.1 (*) 1.7 - 7.7 K/uL   Lymphocytes Relative 6 (*) 12 - 46 %   Lymphs Abs 1.2  0.7 - 4.0 K/uL   Monocytes Relative 9  3 - 12 %   Monocytes Absolute 1.8 (*) 0.1 - 1.0 K/uL   Eosinophils Relative 0  0 - 5 %   Eosinophils Absolute 0.1  0.0 - 0.7 K/uL   Basophils Relative 0  0 - 1 %   Basophils Absolute 0.0  0.0 - 0.1 K/uL  COMPREHENSIVE METABOLIC PANEL     Status: Abnormal   Collection Time    07/19/12 11:34 AM      Result Value Range    Sodium 134 (*) 135 - 145 mEq/L   Potassium 4.8  3.5 - 5.1 mEq/L   Chloride 100  96 - 112 mEq/L   CO2 21  19 - 32 mEq/L   Glucose, Bld 134 (*) 70 - 99 mg/dL   BUN 46 (*) 6 - 23 mg/dL   Creatinine, Ser 1.98 (*) 0.50 - 1.10 mg/dL   Calcium 8.7  8.4 - 10.5 mg/dL   Total Protein 7.4  6.0 - 8.3 g/dL   Albumin 2.4 (*) 3.5 - 5.2 g/dL   AST 19  0 - 37 U/L   ALT 32  0 - 35 U/L   Alkaline Phosphatase 370 (*) 39 - 117 U/L   Total Bilirubin 0.3  0.3 - 1.2 mg/dL   GFR calc non Af Amer 24 (*) >90 mL/min   GFR calc Af Amer 28 (*) >90 mL/min   Comment:            The eGFR has been calculated     using the CKD EPI equation.     This calculation has not been     validated in all clinical     situations.     eGFR's persistently     <90 mL/min signify     possible Chronic Kidney Disease.  LIPASE, BLOOD     Status: None   Collection Time    07/19/12 11:34 AM      Result Value Range   Lipase 14  11 - 59 U/L  TROPONIN I     Status: None   Collection Time    07/19/12 11:34 AM      Result Value Range   Troponin I <0.30  <0.30 ng/mL   Comment:            Due to the release kinetics of cTnI,     a negative result within the first hours     of the onset of symptoms does not rule out     myocardial infarction with certainty.     If myocardial infarction is still suspected,     repeat the test at appropriate intervals.  OCCULT BLOOD, POC DEVICE     Status: None   Collection Time    07/19/12 11:37 AM      Result Value Range   Fecal Occult Bld NEGATIVE  NEGATIVE  CG4 I-STAT (LACTIC ACID)     Status: None   Collection Time  07/19/12 11:47 AM      Result Value Range   Lactic Acid, Venous 1.46  0.5 - 2.2 mmol/L  URINALYSIS, ROUTINE W REFLEX MICROSCOPIC     Status: Abnormal   Collection Time    07/19/12 12:27 PM      Result Value Range   Color, Urine YELLOW  YELLOW   APPearance CLEAR  CLEAR   Specific Gravity, Urine 1.017  1.005 - 1.030   pH 5.0  5.0 - 8.0   Glucose, UA NEGATIVE  NEGATIVE  mg/dL   Hgb urine dipstick NEGATIVE  NEGATIVE   Bilirubin Urine NEGATIVE  NEGATIVE   Ketones, ur NEGATIVE  NEGATIVE mg/dL   Protein, ur 30 (*) NEGATIVE mg/dL   Urobilinogen, UA 0.2  0.0 - 1.0 mg/dL   Nitrite NEGATIVE  NEGATIVE   Leukocytes, UA NEGATIVE  NEGATIVE  URINE MICROSCOPIC-ADD ON     Status: None   Collection Time    07/19/12 12:27 PM      Result Value Range   Squamous Epithelial / LPF RARE  RARE   WBC, UA 0-2  <3 WBC/hpf   RBC / HPF 0-2  <3 RBC/hpf   Bacteria, UA RARE  RARE   Ct Abdomen Pelvis Wo Contrast  07/19/2012   *RADIOLOGY REPORT*  Clinical Data: Abdominal pain and fever.  CT ABDOMEN AND PELVIS WITHOUT CONTRAST  Technique:  Multidetector CT imaging of the abdomen and pelvis was performed following the standard protocol without intravenous contrast.  Comparison: Radiographs dated 07/19/2012  Findings: There is a 10 x 8.5 x 6.5 cm periappendiceal abscess in the left lower quadrant due to perforated appendicitis.  There are three fecaliths seen within the irregular abscess.  The cecum lies in the left mid abdomen.  There is thickening of the mucosa of the adjacent terminal ileum.  The liver, biliary tree, spleen, pancreas, adrenal glands, and kidneys are normal.  There is no free intraperitoneal air and only a tiny amount of free fluid in the pelvic cul-de-sac.  The uterus and ovaries are normal.  No acute osseous abnormality.  IMPRESSION: Perforated appendicitis with a large periappendiceal abscess.  The appendix and abscess are in the LEFT lower quadrant.   Original Report Authenticated By: Lorriane Shire, M.D.   Dg Abd Acute W/chest  07/19/2012   *RADIOLOGY REPORT*  Clinical Data: Abdominal pain.  ACUTE ABDOMEN SERIES (ABDOMEN 2 VIEW & CHEST 1 VIEW)  Comparison: 07/16/2012 and chest x-ray dated 09/08/2011   Findings: Chronic mild cardiomegaly with chronic pulmonary vascular congestion.  Lungs are hyperinflated consistent with emphysema.  No free air in the abdomen.  There is  fairly extensive stool in the nondistended colon.  There is no fecal impaction.  There is fairly extensive air in the nondistended small bowel.  No acute osseous abnormality. Splenic artery calcifications in the left upper quadrant.  IMPRESSION: No acute abnormality of the abdomen or chest.   Original Report Authenticated By: Lorriane Shire, M.D.       Assessment/Plan LLQ abdominal pain Acute perforated appendicitis with abscess on CT scan Leukocytosis Nausea Constipation Acute renal failure  Plan: 1.  Admit to Medicine due to chronic medical conditions and acute renal failure.  No surgical intervention planned at this time, will determine if surgery is indicated next week vs f/u as an outpatient after course of antibiotics 2.  Recommend IR perc drainage- patient just underwent successful drainage 3.  NPO, IVF, pain control, antiemetics, antibiotics (Invanz) 4.  Multiple medical problems including ARF will be  managed by the medicine team 5.  Will follow along with you   Coralie Keens 07/19/2012, 5:31 PM Pager: [phone]

## 2012-07-19 NOTE — ED Notes (Signed)
Patient C/O abdominal pain. She was seen at [facility] yesterday and was treated for a UTI.  Comes to [facility] ED with same complaint

## 2012-07-19 NOTE — Procedures (Signed)
Interventional Radiology Procedure Note  Procedure: CT guided placement of 61F drain into appendiceal abscess.  200 mL foul smelling purulent fluid aspirated.  Complications:  None Recommendations: - Maintain to bulb suction - Follow Cx, adjust Abx accordingly - CT scan prior to drain removal - Interval appendectomy when ok by surgery  Signed,  Criselda Peaches, MD Vascular & Interventional Radiologist [facility] Radiology

## 2012-07-19 NOTE — ED Notes (Signed)
Maylon Cos  [phone] Daughter

## 2012-07-19 NOTE — Assessment & Plan Note (Signed)
Elevated BP, pt has white coat hypertension but also in pain, no recent med changes

## 2012-07-19 NOTE — ED Notes (Signed)
MD at bedside. 

## 2012-07-19 NOTE — Assessment & Plan Note (Signed)
Per above, send to ER for further work-up , CT scan

## 2012-07-19 NOTE — Progress Notes (Signed)
Patient ID: Laura Best, female   DOB: May 15, 1940, 72 y.o.   MRN: [id number] Request received for CT guided drainage of a left periappendiceal abscess on pt with hx abd pain, fever, leukocytosis. Imaging studies were reviewed by Dr. Laurence Ferrari. Additional PMH as below. Exam: Pt awake/alert; chest - distant BS bilat ; heart- sl brady but reg rhythm; abd- soft, sl dist,  tender LLQ>RLQ, few BS; ext- no edema.    Filed Vitals:   07/19/12 1129 07/19/12 1506 07/19/12 1530 07/19/12 1600  BP: 133/53 137/45 133/44 120/47  Pulse:  64 62 54  Temp:    102 F (38.9 C)  TempSrc:    Oral  Resp:  12 29 23   SpO2:  90% 96% 95%   Past Medical History  Diagnosis Date  . Diabetes mellitus   . Hypertension   . Hyperlipidemia   . Tricuspid regurgitation     Echo 05/02/06-Nml LV. Mild MR. Mod TR  . Osteoporosis   . Diastolic heart failure    Past Surgical History  Procedure Laterality Date  . Heart chamber revision      patched hole --1993  . Tubal ligation     Ct Abdomen Pelvis Wo Contrast  07/19/2012   *RADIOLOGY REPORT*  Clinical Data: Abdominal pain and fever.  CT ABDOMEN AND PELVIS WITHOUT CONTRAST  Technique:  Multidetector CT imaging of the abdomen and pelvis was performed following the standard protocol without intravenous contrast.  Comparison: Radiographs dated 07/19/2012  Findings: There is a 10 x 8.5 x 6.5 cm periappendiceal abscess in the left lower quadrant due to perforated appendicitis.  There are three fecaliths seen within the irregular abscess.  The cecum lies in the left mid abdomen.  There is thickening of the mucosa of the adjacent terminal ileum.  The liver, biliary tree, spleen, pancreas, adrenal glands, and kidneys are normal.  There is no free intraperitoneal air and only a tiny amount of free fluid in the pelvic cul-de-sac.  The uterus and ovaries are normal.  No acute osseous abnormality.  IMPRESSION: Perforated appendicitis with a large periappendiceal abscess.  The appendix and  abscess are in the LEFT lower quadrant.   Original Report Authenticated By: Lorriane Shire, M.D.   Dg Abd Acute W/chest  07/19/2012   *RADIOLOGY REPORT*  Clinical Data: Abdominal pain.  ACUTE ABDOMEN SERIES (ABDOMEN 2 VIEW & CHEST 1 VIEW)  Comparison: 07/16/2012 and chest x-ray dated 09/08/2011   Findings: Chronic mild cardiomegaly with chronic pulmonary vascular congestion.  Lungs are hyperinflated consistent with emphysema.  No free air in the abdomen.  There is fairly extensive stool in the nondistended colon.  There is no fecal impaction.  There is fairly extensive air in the nondistended small bowel.  No acute osseous abnormality. Splenic artery calcifications in the left upper quadrant.  IMPRESSION: No acute abnormality of the abdomen or chest.   Original Report Authenticated By: Lorriane Shire, M.D.   Dg Abd Acute W/chest  07/17/2012   *RADIOLOGY REPORT*  Clinical Data: Left lower quadrant pain.  Nausea, fever.  ACUTE ABDOMEN SERIES (ABDOMEN 2 VIEW & CHEST 1 VIEW)  Comparison: Chest x-ray 09/08/2011  Findings: There is hyperinflation of the lungs compatible with COPD.  Prior median sternotomy and CABG.  Heart is borderline in size.  No confluent opacities or effusions.  Mild gaseous distention of the transverse colon.  No small bowel distention to suggest obstruction.  No free air.  No organomegaly or suspicious calcification.  No acute bony abnormality.  IMPRESSION:  Suspect mild ileus.  COPD, cardiomegaly.   Original Report Authenticated By: Rolm Baptise, M.D.  Results for orders placed during the hospital encounter of 07/19/12  CBC WITH DIFFERENTIAL      Result Value Range   WBC 20.2 (*) 4.0 - 10.5 K/uL   RBC 3.06 (*) 3.87 - 5.11 MIL/uL   Hemoglobin 8.4 (*) 12.0 - 15.0 g/dL   HCT 26.0 (*) 36.0 - 46.0 %   MCV 85.0  78.0 - 100.0 fL   MCH 27.5  26.0 - 34.0 pg   MCHC 32.3  30.0 - 36.0 g/dL   RDW 15.0  11.5 - 15.5 %   Platelets 412 (*) 150 - 400 K/uL   Neutrophils Relative % 85 (*) 43 - 77 %    Neutro Abs 17.1 (*) 1.7 - 7.7 K/uL   Lymphocytes Relative 6 (*) 12 - 46 %   Lymphs Abs 1.2  0.7 - 4.0 K/uL   Monocytes Relative 9  3 - 12 %   Monocytes Absolute 1.8 (*) 0.1 - 1.0 K/uL   Eosinophils Relative 0  0 - 5 %   Eosinophils Absolute 0.1  0.0 - 0.7 K/uL   Basophils Relative 0  0 - 1 %   Basophils Absolute 0.0  0.0 - 0.1 K/uL  COMPREHENSIVE METABOLIC PANEL      Result Value Range   Sodium 134 (*) 135 - 145 mEq/L   Potassium 4.8  3.5 - 5.1 mEq/L   Chloride 100  96 - 112 mEq/L   CO2 21  19 - 32 mEq/L   Glucose, Bld 134 (*) 70 - 99 mg/dL   BUN 46 (*) 6 - 23 mg/dL   Creatinine, Ser 1.98 (*) 0.50 - 1.10 mg/dL   Calcium 8.7  8.4 - 10.5 mg/dL   Total Protein 7.4  6.0 - 8.3 g/dL   Albumin 2.4 (*) 3.5 - 5.2 g/dL   AST 19  0 - 37 U/L   ALT 32  0 - 35 U/L   Alkaline Phosphatase 370 (*) 39 - 117 U/L   Total Bilirubin 0.3  0.3 - 1.2 mg/dL   GFR calc non Af Amer 24 (*) >90 mL/min   GFR calc Af Amer 28 (*) >90 mL/min  LIPASE, BLOOD      Result Value Range   Lipase 14  11 - 59 U/L  TROPONIN I      Result Value Range   Troponin I <0.30  <0.30 ng/mL  URINALYSIS, ROUTINE W REFLEX MICROSCOPIC      Result Value Range   Color, Urine YELLOW  YELLOW   APPearance CLEAR  CLEAR   Specific Gravity, Urine 1.017  1.005 - 1.030   pH 5.0  5.0 - 8.0   Glucose, UA NEGATIVE  NEGATIVE mg/dL   Hgb urine dipstick NEGATIVE  NEGATIVE   Bilirubin Urine NEGATIVE  NEGATIVE   Ketones, ur NEGATIVE  NEGATIVE mg/dL   Protein, ur 30 (*) NEGATIVE mg/dL   Urobilinogen, UA 0.2  0.0 - 1.0 mg/dL   Nitrite NEGATIVE  NEGATIVE   Leukocytes, UA NEGATIVE  NEGATIVE  URINE MICROSCOPIC-ADD ON      Result Value Range   Squamous Epithelial / LPF RARE  RARE   WBC, UA 0-2  <3 WBC/hpf   RBC / HPF 0-2  <3 RBC/hpf   Bacteria, UA RARE  RARE  CG4 I-STAT (LACTIC ACID)      Result Value Range   Lactic Acid, Venous 1.46  0.5 - 2.2 mmol/L  OCCULT BLOOD, POC DEVICE      Result Value Range   Fecal Occult Bld NEGATIVE  NEGATIVE    A/P: Pt with hx fever, abd pain , leukocytosis and evidence of a left periappendiceal abscess on CT. Plan is for CT guided drainage of the periappendiceal abscess today. Details/risks of procedure d/w pt/son with their understanding and consent.

## 2012-07-20 LAB — CBC
Hemoglobin: 7.4 g/dL — ABNORMAL LOW (ref 12.0–15.0)
MCH: 27.8 pg (ref 26.0–34.0)
MCV: 86.1 fL (ref 78.0–100.0)
Platelets: 312 10*3/uL (ref 150–400)
RBC: 2.66 MIL/uL — ABNORMAL LOW (ref 3.87–5.11)
WBC: 14.3 10*3/uL — ABNORMAL HIGH (ref 4.0–10.5)

## 2012-07-20 LAB — PROTIME-INR: Prothrombin Time: 14.6 seconds (ref 11.6–15.2)

## 2012-07-20 LAB — COMPREHENSIVE METABOLIC PANEL
AST: 17 U/L (ref 0–37)
Albumin: 1.8 g/dL — ABNORMAL LOW (ref 3.5–5.2)
BUN: 46 mg/dL — ABNORMAL HIGH (ref 6–23)
Calcium: 8 mg/dL — ABNORMAL LOW (ref 8.4–10.5)
Chloride: 103 mEq/L (ref 96–112)
Creatinine, Ser: 2.17 mg/dL — ABNORMAL HIGH (ref 0.50–1.10)
Total Bilirubin: 0.2 mg/dL — ABNORMAL LOW (ref 0.3–1.2)

## 2012-07-20 LAB — GLUCOSE, CAPILLARY
Glucose-Capillary: 115 mg/dL — ABNORMAL HIGH (ref 70–99)
Glucose-Capillary: 75 mg/dL (ref 70–99)

## 2012-07-20 LAB — HEMOGLOBIN A1C
Hgb A1c MFr Bld: 7.2 % — ABNORMAL HIGH (ref <5.7)
Mean Plasma Glucose: 160 mg/dL — ABNORMAL HIGH (ref <117)

## 2012-07-20 LAB — TROPONIN I
Troponin I: <0.3 ng/mL (ref <0.30)
Troponin I: <0.3 ng/mL (ref <0.30)

## 2012-07-20 MED ORDER — ACETAMINOPHEN 325 MG PO TABS: 650.0000 mg | ORAL_TABLET | Freq: Four times a day (QID) | ORAL | Status: DC | PRN

## 2012-07-20 MED ORDER — ACETAMINOPHEN 325 MG PO TABS
650.0000 mg | ORAL_TABLET | Freq: Once | ORAL | Status: AC
  Administered 2012-07-20: 650 mg via ORAL
  Filled 2012-07-20: qty 2

## 2012-07-20 MED ORDER — VANCOMYCIN HCL IN DEXTROSE 1-5 GM/200ML-% IV SOLN
1000.0000 mg | INTRAVENOUS | Status: DC
  Filled 2012-07-20: qty 200

## 2012-07-20 NOTE — Progress Notes (Signed)
Subjective: Comfortable this morning. Abdominal pain less Passing flatus  Objective: Vital signs in last 24 hours: Temp:  [98.5 F (36.9 C)-102 F (38.9 C)] 99.2 F (37.3 C) (06/28 0400) Pulse Rate:  [41-75] 42 (06/28 0600) Resp:  [12-29] 19 (06/28 0600) BP: (104-137)/(37-70) 107/43 mmHg (06/28 0600) SpO2:  [90 %-99 %] 96 % (06/28 0600) Weight:  [144 lb 2.9 oz (65.4 kg)] 144 lb 2.9 oz (65.4 kg) (06/27 1859)    Intake/Output from previous day: 06/27 0701 - 06/28 0700 In: 1290 [I.V.:1250] Out: 80 [Drains:80] Intake/Output this shift: Total I/O In: 35 [Other:35] Out: -   Abdomen soft with only mild LLQ tenderness Drain purulent  Lab Results:   Recent Labs  07/19/12 1810 07/20/12 0405  WBC 17.7* 14.3*  HGB 8.2* 7.4*  HCT 25.0* 22.9*  PLT 389 312   BMET  Recent Labs  07/19/12 1134 07/19/12 1810 07/20/12 0405  NA 134*  --  133*  K 4.8  --  5.0  CL 100  --  103  CO2 21  --  23  GLUCOSE 134*  --  89  BUN 46*  --  46*  CREATININE 1.98* 2.00* 2.17*  CALCIUM 8.7  --  8.0*   PT/INR  Recent Labs  07/20/12 0405  LABPROT 14.6  INR 1.16   ABG No results found for this basename: PHART, PCO2, PO2, HCO3,  in the last 72 hours  Studies/Results: Ct Abdomen Pelvis Wo Contrast  07/19/2012   *RADIOLOGY REPORT*  Clinical Data: Abdominal pain and fever.  CT ABDOMEN AND PELVIS WITHOUT CONTRAST  Technique:  Multidetector CT imaging of the abdomen and pelvis was performed following the standard protocol without intravenous contrast.  Comparison: Radiographs dated 07/19/2012  Findings: There is a 10 x 8.5 x 6.5 cm periappendiceal abscess in the left lower quadrant due to perforated appendicitis.  There are three fecaliths seen within the irregular abscess.  The cecum lies in the left mid abdomen.  There is thickening of the mucosa of the adjacent terminal ileum.  The liver, biliary tree, spleen, pancreas, adrenal glands, and kidneys are normal.  There is no free  intraperitoneal air and only a tiny amount of free fluid in the pelvic cul-de-sac.  The uterus and ovaries are normal.  No acute osseous abnormality.  IMPRESSION: Perforated appendicitis with a large periappendiceal abscess.  The appendix and abscess are in the LEFT lower quadrant.   Original Report Authenticated By: Lorriane Shire, M.D.   Ct Guided Abscess Drain  07/19/2012   *RADIOLOGY REPORT*  C T ABSCESS DRAINAGE PLACEMENT  Date: 07/19/2012  Clinical History: 72 year old female with perforated appendicitis and left lower quadrant abscess.  Procedures Performed: 1. CT guided placement of a 14-French drain  Interventional Radiologist:  Criselda Peaches, MD  Sedation: Moderate (conscious) sedation was used.  To mg Versed, 25 mcg Fentanyl were administered intravenously.  The patient's vital signs were monitored continuously by radiology nursing throughout the procedure.  Sedation Time: 10 minutes  Fluoroscopy time: 2 seconds  Contrast volume: None  PROCEDURE/FINDINGS:   Informed consent was obtained from the patient following explanation of the procedure, risks, benefits and alternatives. The patient understands, agrees and consents for the procedure. All questions were addressed. A time out was performed.  Maximal barrier sterile technique utilized including caps, mask, sterile gowns, sterile gloves, large sterile drape, hand hygiene, and betadine skin prep.  A planning axial CT scan was performed.  The left lower quadrant abscess collection was identified.  A  suitable skin entry site was selected and marked.  Local anesthesia was achieved by infiltration of 1% lidocaine.  Under CT fluoroscopic guidance, an 18 gauge trocar needle was advanced over the abscess.  A 0.0350 guidewire was then coiled within the abscess collection.  The tract was serially dilated to 14-French and a Lacinda Axon 32 [place] all-purpose drainage catheter was advanced into the abscess.  Approximately 200 ml of foul-smelling purulent fluid was  then aspirated.  The drain was secured in place with an O Prolene suture and connected to a suction bulb drainage.  A final axial CT scan demonstrated excellent placement of the drain and significant decrease in the size of the abscess collection.  The patient tolerated the procedure well, there was no immediate complication.  IMPRESSION:  Technically successful placement of a 14-French drainage catheter into the left lower quadrant appendiceal abscess.  Signed,  Criselda Peaches, MD Vascular & Interventional Radiologist [facility]   Original Report Authenticated By: Jacqulynn Cadet, M.D.   Dg Abd Acute W/chest  07/19/2012   *RADIOLOGY REPORT*  Clinical Data: Abdominal pain.  ACUTE ABDOMEN SERIES (ABDOMEN 2 VIEW & CHEST 1 VIEW)  Comparison: 07/16/2012 and chest x-ray dated 09/08/2011   Findings: Chronic mild cardiomegaly with chronic pulmonary vascular congestion.  Lungs are hyperinflated consistent with emphysema.  No free air in the abdomen.  There is fairly extensive stool in the nondistended colon.  There is no fecal impaction.  There is fairly extensive air in the nondistended small bowel.  No acute osseous abnormality. Splenic artery calcifications in the left upper quadrant.  IMPRESSION: No acute abnormality of the abdomen or chest.   Original Report Authenticated By: Lorriane Shire, M.D.    Anti-infectives: Anti-infectives   Start     Dose/Rate Route Frequency Ordered Stop   07/19/12 1800  vancomycin (VANCOCIN) IVPB 1000 mg/200 mL premix     1,000 mg 200 mL/hr over 60 Minutes Intravenous Every 24 hours 07/19/12 1752     07/19/12 1545  ertapenem (INVANZ) 1 g in sodium chloride 0.9 % 50 mL IVPB     1 g 100 mL/hr over 30 Minutes Intravenous Every 24 hours 07/19/12 1537     07/19/12 1430  piperacillin-tazobactam (ZOSYN) IVPB 3.375 g     3.375 g 12.5 mL/hr over 240 Minutes Intravenous  Once 07/19/12 1426 07/19/12 1519      Assessment/Plan: s/p * No surgery found *  S/p IR  placement of drain in appendiceal abscess.   WBC improved. Continue drain and IV antibiotics.  Follow up CT next week Will allow clear liquids. Chronic anemia and renal failure per medicine  LOS: 1 day    Dashanique Brownstein A 07/20/2012

## 2012-07-20 NOTE — Progress Notes (Addendum)
[facility] Progress Note [place] TEAM 1 - Stepdown/ICU TEAM   Laura Best [id number] DOB: [birth year] DOA: 07/19/2012 PCP: [name], PA-C  Brief narrative: 72 y/o female presented to [facility] from her PCPs office with a two-week history of left-sided abdominal pain associated with nausea and constipation. She stated she'd not had a bowel movement in one week. She denied fevers, chest pain, shortness of breath, or chills. She was not sure whether she was treated for UTI. She was seen at [facility] 3 days before her admit for similar complaints and found to have renal failure. The LLQ abdominal pain and nausea worsened so she saw her PCP. No history of gastrointestinal problems in the past. She denied any blood in her stools. She endorsed poor appetite and nausea, but no vomiting. Surgery was consulted because her CT scan showed acute appendicitis with perforation and abscess. The patient received IR drain placement in the ED   Assessment/Plan:  Abscess, appendix (cecum/appendix in LEFT lower quadrant) Status post percutaneous drain per IR - ongoing care per General Surgery  Hypertension Blood pressure is currently well-controlled  Acute on chronic renal failure Creatinine 2.0 at presentation - baseline creatinine 1.21 as of March 2014 - currently creatinine is climbing - hydrate and follow trend - place Foley catheter to assure Is/Os are closely monitored  Bradycardia HR has consistently been in the 40s since admit to the SDU -   Diabetes type 2, uncontrolled CBGs currently well controlled - A1c 7.2  Normocytic anemia May be due to smoldering infection - goal will be to keep hemoglobin greater than 7.0  Code Status: FULL Family Communication: No family present at time of visit  Disposition Plan: SDU  Consultants: Gen Surgery   Procedures: 6/27 - IR placement of drain in appendiceal abscess  Antibiotics: Ertapenem 6/27 >> Zosyn 6/27  DVT  prophylaxis: Lovenox  HPI/Subjective: The patient is alert and quite pleasant.  She complains of only mild abdominal pain at this time.  She states that she is passing gas.  She states that she is not very hungry.  She denies chest pain or shortness of breath.  Objective: Blood pressure 121/43, pulse 41, temperature 99 F (37.2 C), temperature source Oral, resp. rate 15, height 5\' 3"  (1.6 m), weight 65.4 kg (144 lb 2.9 oz), SpO2 100.00%.  Intake/Output Summary (Last 24 hours) at 07/20/12 1158 Last data filed at 07/20/12 1100  Gross per 24 hour  Intake   1945 ml  Output     80 ml  Net   1865 ml    Exam: General: No acute respiratory distress Lungs: Clear to auscultation bilaterally without wheezes or crackles Cardiovascular:  bradycardic rate and  a regular rhythm without murmur gallop or rub normal S1 and S2 Abdomen: percutaneous drain is present in the left lower quadrant, the abdomen is slightly protuberant with mild rebound tenderness but is soft with positive bowel sounds  Extremities: No significant cyanosis, clubbing, or edema bilateral lower extremities  Data Reviewed: Basic Metabolic Panel:  Recent Labs Lab 07/16/12 2341 07/19/12 1005 07/19/12 1134 07/19/12 1810 07/20/12 0405  NA 130*  --  134*  --  133*  K 4.5  --  4.8  --  5.0  CL 93*  --  100  --  103  CO2 23  --  21  --  23  GLUCOSE 92 163* 134*  --  89  BUN 48*  --  46*  --  46*  CREATININE 2.01*  --  1.98* 2.00* 2.17*  CALCIUM 9.0  --  8.7  --  8.0*   Liver Function Tests:  Recent Labs Lab 07/19/12 1134 07/20/12 0405  AST 19 17  ALT 32 22  ALKPHOS 370* 277*  BILITOT 0.3 0.2*  PROT 7.4 6.0  ALBUMIN 2.4* 1.8*    Recent Labs Lab 07/19/12 1134  LIPASE 14   CBC:  Recent Labs Lab 07/16/12 2341 07/19/12 1005 07/19/12 1134 07/19/12 1810 07/20/12 0405  WBC 16.3* 19.5* 20.2* 17.7* 14.3*  NEUTROABS 13.4* 16.2* 17.1*  --   --   HGB 9.1* 9.3* 8.4* 8.2* 7.4*  HCT 26.9* 28.5* 26.0* 25.0* 22.9*   MCV 84.3 86.4 85.0 85.6 86.1  PLT 327 387 412* 389 312   Cardiac Enzymes:  Recent Labs Lab 07/19/12 1134 07/19/12 1825 07/19/12 2331 07/20/12 0405  TROPONINI <0.30 <0.30 <0.30 <0.30   CBG:  Recent Labs Lab 07/19/12 1928 07/19/12 2011 07/20/12 0003 07/20/12 0412 07/20/12 0815  GLUCAP 63* 77 115* 75 79    Recent Results (from the past 240 hour(s))  CULTURE, ROUTINE-ABSCESS     Status: None   Collection Time    07/19/12  5:33 PM      Result Value Range Status   Specimen Description ABSCESS FLUID   Final   Special Requests PERITONEAL CAVITY   Final   Gram Stain PENDING   Incomplete   Culture Culture reincubated for better growth   Final   Report Status PENDING   Incomplete  ANAEROBIC CULTURE     Status: None   Collection Time    07/19/12  5:33 PM      Result Value Range Status   Specimen Description ABSCESS FLUID   Final   Special Requests  PERITONEAL CAVITY   Final   Gram Stain     Final   Value: ABUNDANT WBC PRESENT,BOTH PMN AND MONONUCLEAR     NO SQUAMOUS EPITHELIAL CELLS SEEN     MODERATE GRAM NEGATIVE RODS   Culture PENDING   Incomplete   Report Status PENDING   Incomplete     Studies:  Recent x-ray studies have been reviewed in detail by the Attending Physician  Scheduled Meds:  Scheduled Meds: . enoxaparin (LOVENOX) injection  30 mg Subcutaneous Q24H  . ertapenem  1 g Intravenous Q24H  . insulin aspart  0-15 Units Subcutaneous TID WC  . sodium chloride  500 mL Intravenous Once  . sodium chloride  3 mL Intravenous Q12H  . vancomycin  1,000 mg Intravenous Q24H    Time spent on care of this patient: 35 minutes   Onycha  [phone] Pager - Text Page per Shea Evans as per below:  On-Call/Text Page:      [email or web address]      password [email or web address]  If 7PM-7AM, please contact night-coverage [email or web address] Password [email or web address] 07/20/2012, 11:58 AM   LOS: 1 day

## 2012-07-20 NOTE — Progress Notes (Signed)
Subjective: Pt feeling a little better today; has some cont soreness in LLQ; no nausea/vomiting  Objective: Vital signs in last 24 hours: Temp:  [98.5 F (36.9 C)-102 F (38.9 C)] 99.2 F (37.3 C) (06/28 0400) Pulse Rate:  [41-75] 42 (06/28 0600) Resp:  [12-29] 19 (06/28 0600) BP: (104-137)/(37-70) 107/43 mmHg (06/28 0600) SpO2:  [90 %-99 %] 96 % (06/28 0600) Weight:  [144 lb 2.9 oz (65.4 kg)] 144 lb 2.9 oz (65.4 kg) (06/27 1859)    Intake/Output from previous day: 06/27 0701 - 06/28 0700 In: 1290 [I.V.:1250] Out: 80 [Drains:80] Intake/Output this shift: Total I/O In: 35 [Other:35] Out: -   LLQ drain intact, dressing dry, site mildy tender to palpation, output 80 cc's tan fluid, cx's pend; abd mildly dist, +BS  Lab Results:   Recent Labs  07/19/12 1810 07/20/12 0405  WBC 17.7* 14.3*  HGB 8.2* 7.4*  HCT 25.0* 22.9*  PLT 389 312   BMET  Recent Labs  07/19/12 1134 07/19/12 1810 07/20/12 0405  NA 134*  --  133*  K 4.8  --  5.0  CL 100  --  103  CO2 21  --  23  GLUCOSE 134*  --  89  BUN 46*  --  46*  CREATININE 1.98* 2.00* 2.17*  CALCIUM 8.7  --  8.0*   PT/INR  Recent Labs  07/20/12 0405  LABPROT 14.6  INR 1.16   ABG No results found for this basename: PHART, PCO2, PO2, HCO3,  in the last 72 hours Results for orders placed during the hospital encounter of 07/19/12  CULTURE, ROUTINE-ABSCESS     Status: None   Collection Time    07/19/12  5:33 PM      Result Value Range Status   Specimen Description ABSCESS FLUID   Final   Special Requests PERITONEAL CAVITY   Final   Gram Stain PENDING   Incomplete   Culture Culture reincubated for better growth   Final   Report Status PENDING   Incomplete  ANAEROBIC CULTURE     Status: None   Collection Time    07/19/12  5:33 PM      Result Value Range Status   Specimen Description ABSCESS FLUID   Final   Special Requests  PERITONEAL CAVITY   Final   Gram Stain     Final   Value: ABUNDANT WBC PRESENT,BOTH  PMN AND MONONUCLEAR     NO SQUAMOUS EPITHELIAL CELLS SEEN     MODERATE GRAM NEGATIVE RODS   Culture PENDING   Incomplete   Report Status PENDING   Incomplete    Studies/Results: Ct Abdomen Pelvis Wo Contrast  07/19/2012   *RADIOLOGY REPORT*  Clinical Data: Abdominal pain and fever.  CT ABDOMEN AND PELVIS WITHOUT CONTRAST  Technique:  Multidetector CT imaging of the abdomen and pelvis was performed following the standard protocol without intravenous contrast.  Comparison: Radiographs dated 07/19/2012  Findings: There is a 10 x 8.5 x 6.5 cm periappendiceal abscess in the left lower quadrant due to perforated appendicitis.  There are three fecaliths seen within the irregular abscess.  The cecum lies in the left mid abdomen.  There is thickening of the mucosa of the adjacent terminal ileum.  The liver, biliary tree, spleen, pancreas, adrenal glands, and kidneys are normal.  There is no free intraperitoneal air and only a tiny amount of free fluid in the pelvic cul-de-sac.  The uterus and ovaries are normal.  No acute osseous abnormality.  IMPRESSION: Perforated appendicitis  with a large periappendiceal abscess.  The appendix and abscess are in the LEFT lower quadrant.   Original Report Authenticated By: Lorriane Shire, M.D.   Ct Guided Abscess Drain  07/19/2012   *RADIOLOGY REPORT*  C T ABSCESS DRAINAGE PLACEMENT  Date: 07/19/2012  Clinical History: 72 year old female with perforated appendicitis and left lower quadrant abscess.  Procedures Performed: 1. CT guided placement of a 14-French drain  Interventional Radiologist:  Criselda Peaches, MD  Sedation: Moderate (conscious) sedation was used.  To mg Versed, 25 mcg Fentanyl were administered intravenously.  The patient's vital signs were monitored continuously by radiology nursing throughout the procedure.  Sedation Time: 10 minutes  Fluoroscopy time: 2 seconds  Contrast volume: None  PROCEDURE/FINDINGS:   Informed consent was obtained from the patient  following explanation of the procedure, risks, benefits and alternatives. The patient understands, agrees and consents for the procedure. All questions were addressed. A time out was performed.  Maximal barrier sterile technique utilized including caps, mask, sterile gowns, sterile gloves, large sterile drape, hand hygiene, and betadine skin prep.  A planning axial CT scan was performed.  The left lower quadrant abscess collection was identified.  A suitable skin entry site was selected and marked.  Local anesthesia was achieved by infiltration of 1% lidocaine.  Under CT fluoroscopic guidance, an 18 gauge trocar needle was advanced over the abscess.  A 0.0350 guidewire was then coiled within the abscess collection.  The tract was serially dilated to 14-French and a Lacinda Axon 49 [place] all-purpose drainage catheter was advanced into the abscess.  Approximately 200 ml of foul-smelling purulent fluid was then aspirated.  The drain was secured in place with an O Prolene suture and connected to a suction bulb drainage.  A final axial CT scan demonstrated excellent placement of the drain and significant decrease in the size of the abscess collection.  The patient tolerated the procedure well, there was no immediate complication.  IMPRESSION:  Technically successful placement of a 14-French drainage catheter into the left lower quadrant appendiceal abscess.  Signed,  Criselda Peaches, MD Vascular & Interventional Radiologist [facility] Radiology   Original Report Authenticated By: Jacqulynn Cadet, M.D.   Dg Abd Acute W/chest  07/19/2012   *RADIOLOGY REPORT*  Clinical Data: Abdominal pain.  ACUTE ABDOMEN SERIES (ABDOMEN 2 VIEW & CHEST 1 VIEW)  Comparison: 07/16/2012 and chest x-ray dated 09/08/2011   Findings: Chronic mild cardiomegaly with chronic pulmonary vascular congestion.  Lungs are hyperinflated consistent with emphysema.  No free air in the abdomen.  There is fairly extensive stool in the nondistended colon.   There is no fecal impaction.  There is fairly extensive air in the nondistended small bowel.  No acute osseous abnormality. Splenic artery calcifications in the left upper quadrant.  IMPRESSION: No acute abnormality of the abdomen or chest.   Original Report Authenticated By: Lorriane Shire, M.D.    Anti-infectives: Anti-infectives   Start     Dose/Rate Route Frequency Ordered Stop   07/19/12 1800  vancomycin (VANCOCIN) IVPB 1000 mg/200 mL premix     1,000 mg 200 mL/hr over 60 Minutes Intravenous Every 24 hours 07/19/12 1752     07/19/12 1545  ertapenem (INVANZ) 1 g in sodium chloride 0.9 % 50 mL IVPB     1 g 100 mL/hr over 30 Minutes Intravenous Every 24 hours 07/19/12 1537     07/19/12 1430  piperacillin-tazobactam (ZOSYN) IVPB 3.375 g     3.375 g 12.5 mL/hr over 240 Minutes Intravenous  Once 07/19/12 1426 07/19/12 1519      Assessment/Plan: S/p LLQ periappendiceal abscess drainage 6/27; check final cx's, monitor labs, hydrate, ?transfuse,  cont drain irrigation; check f/u CT next week                                                                                                                                                                                                                                                   LOS: 1 day    Dennard Vezina,D [facility] 07/20/2012

## 2012-07-21 LAB — CBC
MCH: 27.9 pg (ref 26.0–34.0)
MCV: 87.4 fL (ref 78.0–100.0)
Platelets: 279 10*3/uL (ref 150–400)
RBC: 2.69 MIL/uL — ABNORMAL LOW (ref 3.87–5.11)
RDW: 15.2 % (ref 11.5–15.5)

## 2012-07-21 LAB — RETICULOCYTES: RBC.: 2.69 MIL/uL — ABNORMAL LOW (ref 3.87–5.11)

## 2012-07-21 LAB — RENAL FUNCTION PANEL
Albumin: 1.8 g/dL — ABNORMAL LOW (ref 3.5–5.2)
BUN: 38 mg/dL — ABNORMAL HIGH (ref 6–23)
Chloride: 106 mEq/L (ref 96–112)
Creatinine, Ser: 1.82 mg/dL — ABNORMAL HIGH (ref 0.50–1.10)

## 2012-07-21 LAB — GLUCOSE, CAPILLARY
Glucose-Capillary: 124 mg/dL — ABNORMAL HIGH (ref 70–99)
Glucose-Capillary: 143 mg/dL — ABNORMAL HIGH (ref 70–99)
Glucose-Capillary: 137 mg/dL — ABNORMAL HIGH (ref 70–99)
Glucose-Capillary: 131 mg/dL — ABNORMAL HIGH (ref 70–99)

## 2012-07-21 LAB — IRON AND TIBC
Iron: 16 ug/dL — ABNORMAL LOW (ref 42–135)
Saturation Ratios: 14 % — ABNORMAL LOW (ref 20–55)
TIBC: 113 ug/dL — ABNORMAL LOW (ref 250–470)
UIBC: 97 ug/dL — ABNORMAL LOW (ref 125–400)

## 2012-07-21 LAB — FOLATE: Folate: >20 ng/mL

## 2012-07-21 MED ORDER — ZOLPIDEM TARTRATE 5 MG PO TABS
5.0000 mg | ORAL_TABLET | Freq: Every evening | ORAL | Status: DC | PRN
  Administered 2012-07-21: 5 mg via ORAL
  Filled 2012-07-21: qty 1

## 2012-07-21 MED ORDER — SODIUM CHLORIDE 0.9 % IV SOLN
INTRAVENOUS | Status: DC
  Administered 2012-07-21 – 2012-07-23 (×3): via INTRAVENOUS

## 2012-07-21 NOTE — Progress Notes (Signed)
Subjective: Pt doing fairly well; got a little weak,"jittery" when attempting to get out of bed yesterday; tolerating diet ok, no nausea/vomiting  Objective: Vital signs in last 24 hours: Temp:  [98.3 F (36.8 C)-102.1 F (38.9 C)] 98.3 F (36.8 C) (06/29 0511) Pulse Rate:  [41-64] 46 (06/29 0700) Resp:  [15-24] 18 (06/29 0700) BP: (120-139)/(39-51) 138/49 mmHg (06/29 0700) SpO2:  [90 %-100 %] 99 % (06/29 0700) Weight:  [149 lb 14.6 oz (68 kg)] 149 lb 14.6 oz (68 kg) (06/29 0511)    Intake/Output from previous day: 06/28 0701 - 06/29 0700 In: 2640 [P.O.:600; I.V.:2000] Out: 260 [Urine:200; Drains:60] Intake/Output this shift: Total I/O In: -  Out: 200 [Urine:200]  LLQ drain intact, output 60 cc's , cx's pend- gram neg rods, insertion site mildly tender  Lab Results:   Recent Labs  07/20/12 0405 07/21/12 0650  WBC 14.3* 8.6  HGB 7.4* 7.5*  HCT 22.9* 23.5*  PLT 312 279   BMET  Recent Labs  07/20/12 0405 07/21/12 0650  NA 133* 134*  K 5.0 5.1  CL 103 106  CO2 23 21  GLUCOSE 89 136*  BUN 46* 38*  CREATININE 2.17* 1.82*  CALCIUM 8.0* 8.1*   PT/INR  Recent Labs  07/20/12 0405  LABPROT 14.6  INR 1.16   Results for orders placed during the hospital encounter of 07/19/12  CULTURE, ROUTINE-ABSCESS     Status: None   Collection Time    07/19/12  5:33 PM      Result Value Range Status   Specimen Description ABSCESS FLUID   Final   Special Requests PERITONEAL CAVITY   Final   Gram Stain     Final   Value: ABUNDANT WBC PRESENT,BOTH PMN AND MONONUCLEAR     NO SQUAMOUS EPITHELIAL CELLS SEEN     MODERATE GRAM NEGATIVE RODS   Culture ABUNDANT GRAM NEGATIVE RODS   Final   Report Status PENDING   Incomplete  ANAEROBIC CULTURE     Status: None   Collection Time    07/19/12  5:33 PM      Result Value Range Status   Specimen Description ABSCESS FLUID   Final   Special Requests  PERITONEAL CAVITY   Final   Gram Stain     Final   Value: ABUNDANT WBC  PRESENT,BOTH PMN AND MONONUCLEAR     NO SQUAMOUS EPITHELIAL CELLS SEEN     MODERATE GRAM NEGATIVE RODS   Culture     Final   Value: NO ANAEROBES ISOLATED; CULTURE IN PROGRESS FOR 5 DAYS   Report Status PENDING   Incomplete    ABG No results found for this basename: PHART, PCO2, PO2, HCO3,  in the last 72 hours  Studies/Results: Ct Abdomen Pelvis Wo Contrast  07/19/2012   *RADIOLOGY REPORT*  Clinical Data: Abdominal pain and fever.  CT ABDOMEN AND PELVIS WITHOUT CONTRAST  Technique:  Multidetector CT imaging of the abdomen and pelvis was performed following the standard protocol without intravenous contrast.  Comparison: Radiographs dated 07/19/2012  Findings: There is a 10 x 8.5 x 6.5 cm periappendiceal abscess in the left lower quadrant due to perforated appendicitis.  There are three fecaliths seen within the irregular abscess.  The cecum lies in the left mid abdomen.  There is thickening of the mucosa of the adjacent terminal ileum.  The liver, biliary tree, spleen, pancreas, adrenal glands, and kidneys are normal.  There is no free intraperitoneal air and only a tiny amount of free fluid  in the pelvic cul-de-sac.  The uterus and ovaries are normal.  No acute osseous abnormality.  IMPRESSION: Perforated appendicitis with a large periappendiceal abscess.  The appendix and abscess are in the LEFT lower quadrant.   Original Report Authenticated By: Lorriane Shire, M.D.   Ct Guided Abscess Drain  07/19/2012   *RADIOLOGY REPORT*  C T ABSCESS DRAINAGE PLACEMENT  Date: 07/19/2012  Clinical History: 72 year old female with perforated appendicitis and left lower quadrant abscess.  Procedures Performed: 1. CT guided placement of a 14-French drain  Interventional Radiologist:  Criselda Peaches, MD  Sedation: Moderate (conscious) sedation was used.  To mg Versed, 25 mcg Fentanyl were administered intravenously.  The patient's vital signs were monitored continuously by radiology nursing throughout the  procedure.  Sedation Time: 10 minutes  Fluoroscopy time: 2 seconds  Contrast volume: None  PROCEDURE/FINDINGS:   Informed consent was obtained from the patient following explanation of the procedure, risks, benefits and alternatives. The patient understands, agrees and consents for the procedure. All questions were addressed. A time out was performed.  Maximal barrier sterile technique utilized including caps, mask, sterile gowns, sterile gloves, large sterile drape, hand hygiene, and betadine skin prep.  A planning axial CT scan was performed.  The left lower quadrant abscess collection was identified.  A suitable skin entry site was selected and marked.  Local anesthesia was achieved by infiltration of 1% lidocaine.  Under CT fluoroscopic guidance, an 18 gauge trocar needle was advanced over the abscess.  A 0.0350 guidewire was then coiled within the abscess collection.  The tract was serially dilated to 14-French and a Lacinda Axon 65 [place] all-purpose drainage catheter was advanced into the abscess.  Approximately 200 ml of foul-smelling purulent fluid was then aspirated.  The drain was secured in place with an O Prolene suture and connected to a suction bulb drainage.  A final axial CT scan demonstrated excellent placement of the drain and significant decrease in the size of the abscess collection.  The patient tolerated the procedure well, there was no immediate complication.  IMPRESSION:  Technically successful placement of a 14-French drainage catheter into the left lower quadrant appendiceal abscess.  Signed,  Criselda Peaches, MD Vascular & Interventional Radiologist [facility] Radiology   Original Report Authenticated By: Jacqulynn Cadet, M.D.   Dg Abd Acute W/chest  07/19/2012   *RADIOLOGY REPORT*  Clinical Data: Abdominal pain.  ACUTE ABDOMEN SERIES (ABDOMEN 2 VIEW & CHEST 1 VIEW)  Comparison: 07/16/2012 and chest x-ray dated 09/08/2011   Findings: Chronic mild cardiomegaly with chronic pulmonary  vascular congestion.  Lungs are hyperinflated consistent with emphysema.  No free air in the abdomen.  There is fairly extensive stool in the nondistended colon.  There is no fecal impaction.  There is fairly extensive air in the nondistended small bowel.  No acute osseous abnormality. Splenic artery calcifications in the left upper quadrant.  IMPRESSION: No acute abnormality of the abdomen or chest.   Original Report Authenticated By: Lorriane Shire, M.D.    Anti-infectives: Anti-infectives   Start     Dose/Rate Route Frequency Ordered Stop   07/20/12 1500  vancomycin (VANCOCIN) IVPB 1000 mg/200 mL premix  Status:  Discontinued     1,000 mg 200 mL/hr over 60 Minutes Intravenous Every 24 hours 07/20/12 1347 07/20/12 1434   07/19/12 1800  vancomycin (VANCOCIN) IVPB 1000 mg/200 mL premix  Status:  Discontinued     1,000 mg 200 mL/hr over 60 Minutes Intravenous Every 24 hours 07/19/12 1752 07/20/12 1347  07/19/12 1545  ertapenem (INVANZ) 1 g in sodium chloride 0.9 % 50 mL IVPB     1 g 100 mL/hr over 30 Minutes Intravenous Every 24 hours 07/19/12 1537     07/19/12 1430  piperacillin-tazobactam (ZOSYN) IVPB 3.375 g     3.375 g 12.5 mL/hr over 240 Minutes Intravenous  Once 07/19/12 1426 07/19/12 1519      Assessment/Plan: s/p LLQ periappendiceal abscess drainage 6/27; check final cx's/sens, hydrate, monitor labs; check f/u CT latter part of next week; other plans as per primary/CCS  LOS: 2 days    ALLRED,D [facility] 07/21/2012

## 2012-07-21 NOTE — Progress Notes (Signed)
[facility] Progress Note [place] TEAM 1 - Stepdown/ICU TEAM   Laura Best [id number] DOB: [birth year] DOA: 07/19/2012 PCP: [name], PA-C  Brief narrative: 72 y/o female who presented to [facility] from her PCPs office with a two-week history of left-sided abdominal pain associated with nausea and constipation. She stated she'd not had a bowel movement in one week. She denied fevers, chest pain, shortness of breath, or chills. She was seen at [facility] 3 days before her admit for similar complaints and found to have renal failure. The LLQ abdominal pain and nausea worsened so she saw her PCP. No history of gastrointestinal problems in the past. She denied any blood in her stools. She endorsed poor appetite and nausea, but no vomiting. Surgery was consulted because her CT scan showed acute appendicitis (L sided cecum/appendix) with perforation and abscess. The patient received IR drain placement in the ED   Assessment/Plan:  Abscess, appendix (cecum/appendix in LEFT lower quadrant) Status post percutaneous drain per IR - ongoing care per General Surgery  Hypertension Blood pressure is currently well-controlled  Acute on chronic renal failure Creatinine 2.0 at presentation - baseline creatinine 1.21 as of March 2014 - creatinine has improved as of this morning - continue to hydrate and follow trend - keep Foley catheter to assure Is/Os are closely monitored  Bradycardia HR has been in the 40s since admit to the SDU - rates have spontaneously improved into the 50s today - continue to follow on telemetry  Diabetes type 2, uncontrolled CBGs currently well controlled - A1c 7.2  Normocytic anemia Likely due to smoldering infection - goal will be to keep hemoglobin greater than 7.0 - anemia panel pending  Code Status: FULL Family Communication: No family present at time of visit  Disposition Plan: SDU  Consultants: Gen Surgery   Procedures: 6/27 - IR placement of drain  in appendiceal abscess  Antibiotics: Ertapenem 6/27 >> Zosyn 6/27  DVT prophylaxis: Lovenox  HPI/Subjective: The patient is sitting up in a chair at bedside.  She denies abdominal pain at the present time.  She reports that she is passing "a lot of gas" but has not yet had a bowel movement.  She denies chest pain or shortness of breath.  Objective: Blood pressure 121/43, pulse 53, temperature 98.3 F (36.8 C), temperature source Oral, resp. rate 24, height 5\' 3"  (1.6 m), weight 68 kg (149 lb 14.6 oz), SpO2 99.00%.  Intake/Output Summary (Last 24 hours) at 07/21/12 1121 Last data filed at 07/21/12 1000  Gross per 24 hour  Intake   2610 ml  Output    460 ml  Net   2150 ml    Exam: General: No acute respiratory distress Lungs: Clear to auscultation bilaterally without wheezes or crackles Cardiovascular:  bradycardic rate and regular rhythm without murmur gallop or rub normal S1 and S2 Abdomen: percutaneous drain is present in the left lower quadrant, the abdomen is slightly protuberant with mild rebound tenderness but is soft with positive bowel sounds  Extremities: No significant cyanosis, clubbing, or edema bilateral lower extremities  Data Reviewed: Basic Metabolic Panel:  Recent Labs Lab 07/16/12 2341 07/19/12 1005 07/19/12 1134 07/19/12 1810 07/20/12 0405 07/21/12 0650  NA 130*  --  134*  --  133* 134*  K 4.5  --  4.8  --  5.0 5.1  CL 93*  --  100  --  103 106  CO2 23  --  21  --  23 21  GLUCOSE 92 163*  134*  --  89 136*  BUN 48*  --  46*  --  46* 38*  CREATININE 2.01*  --  1.98* 2.00* 2.17* 1.82*  CALCIUM 9.0  --  8.7  --  8.0* 8.1*  PHOS  --   --   --   --   --  3.5   Liver Function Tests:  Recent Labs Lab 07/19/12 1134 07/20/12 0405 07/21/12 0650  AST 19 17  --   ALT 32 22  --   ALKPHOS 370* 277*  --   BILITOT 0.3 0.2*  --   PROT 7.4 6.0  --   ALBUMIN 2.4* 1.8* 1.8*    Recent Labs Lab 07/19/12 1134  LIPASE 14   CBC:  Recent Labs Lab  07/16/12 2341 07/19/12 1005 07/19/12 1134 07/19/12 1810 07/20/12 0405 07/21/12 0650  WBC 16.3* 19.5* 20.2* 17.7* 14.3* 8.6  NEUTROABS 13.4* 16.2* 17.1*  --   --   --   HGB 9.1* 9.3* 8.4* 8.2* 7.4* 7.5*  HCT 26.9* 28.5* 26.0* 25.0* 22.9* 23.5*  MCV 84.3 86.4 85.0 85.6 86.1 87.4  PLT 327 387 412* 389 312 279   Cardiac Enzymes:  Recent Labs Lab 07/19/12 1134 07/19/12 1825 07/19/12 2331 07/20/12 0405  TROPONINI <0.30 <0.30 <0.30 <0.30   CBG:  Recent Labs Lab 07/20/12 0412 07/20/12 0815 07/20/12 1754 07/20/12 1947 07/21/12 0726  GLUCAP 75 79 141* 159* 124*    Recent Results (from the past 240 hour(s))  CULTURE, ROUTINE-ABSCESS     Status: None   Collection Time    07/19/12  5:33 PM      Result Value Range Status   Specimen Description ABSCESS FLUID   Final   Special Requests PERITONEAL CAVITY   Final   Gram Stain     Final   Value: ABUNDANT WBC PRESENT,BOTH PMN AND MONONUCLEAR     NO SQUAMOUS EPITHELIAL CELLS SEEN     MODERATE GRAM NEGATIVE RODS   Culture ABUNDANT GRAM NEGATIVE RODS   Final   Report Status PENDING   Incomplete  ANAEROBIC CULTURE     Status: None   Collection Time    07/19/12  5:33 PM      Result Value Range Status   Specimen Description ABSCESS FLUID   Final   Special Requests  PERITONEAL CAVITY   Final   Gram Stain     Final   Value: ABUNDANT WBC PRESENT,BOTH PMN AND MONONUCLEAR     NO SQUAMOUS EPITHELIAL CELLS SEEN     MODERATE GRAM NEGATIVE RODS   Culture     Final   Value: NO ANAEROBES ISOLATED; CULTURE IN PROGRESS FOR 5 DAYS   Report Status PENDING   Incomplete     Studies:  Recent x-ray studies have been reviewed in detail by the Attending Physician  Scheduled Meds:  Scheduled Meds: . enoxaparin (LOVENOX) injection  30 mg Subcutaneous Q24H  . ertapenem  1 g Intravenous Q24H  . insulin aspart  0-15 Units Subcutaneous TID WC  . sodium chloride  3 mL Intravenous Q12H    Time spent on care of this patient: 35  minutes   [place]  [phone] Pager - Text Page per Shea Evans as per below:  On-Call/Text Page:      [email or web address]      password [email or web address]  If 7PM-7AM, please contact night-coverage [email or web address] Password [email or web address] 07/21/2012, 11:21 AM   LOS: 2 days

## 2012-07-21 NOTE — Progress Notes (Signed)
Patient ID: Laura Best, female   DOB: 06-25-1940, 72 y.o.   MRN: [id number]  General Surgery - [facility] - Progress Note  Subjective: Patient up in chair.  "Sore" on right side of abdomen.  No other complaints.  Objective: Vital signs in last 24 hours: Temp:  [98.3 F (36.8 C)-102.1 F (38.9 C)] 98.3 F (36.8 C) (06/29 0511) Pulse Rate:  [45-64] 49 (06/29 1100) Resp:  [9-24] 9 (06/29 1100) BP: (120-139)/(39-51) 130/43 mmHg (06/29 1100) SpO2:  [90 %-100 %] 100 % (06/29 1100) Weight:  [149 lb 14.6 oz (68 kg)] 149 lb 14.6 oz (68 kg) (06/29 0511)    Intake/Output from previous day: 06/28 0701 - 06/29 0700 In: 2640 [P.O.:600; I.V.:2000] Out: 260 [Urine:200; Drains:60]  Exam: HEENT - clear, not icteric Neck - soft Chest - clear bilaterally Cor - RRR, no murmur Abd - soft, mild distension; few BS present; perc drain LLQ abdomen with brownish red thin output Ext - no significant edema Neuro - grossly intact, no focal deficits  Lab Results:   Recent Labs  07/20/12 0405 07/21/12 0650  WBC 14.3* 8.6  HGB 7.4* 7.5*  HCT 22.9* 23.5*  PLT 312 279     Recent Labs  07/20/12 0405 07/21/12 0650  NA 133* 134*  K 5.0 5.1  CL 103 106  CO2 23 21  GLUCOSE 89 136*  BUN 46* 38*  CREATININE 2.17* 1.82*  CALCIUM 8.0* 8.1*    Studies/Results: Ct Abdomen Pelvis Wo Contrast  07/19/2012   *RADIOLOGY REPORT*  Clinical Data: Abdominal pain and fever.  CT ABDOMEN AND PELVIS WITHOUT CONTRAST  Technique:  Multidetector CT imaging of the abdomen and pelvis was performed following the standard protocol without intravenous contrast.  Comparison: Radiographs dated 07/19/2012  Findings: There is a 10 x 8.5 x 6.5 cm periappendiceal abscess in the left lower quadrant due to perforated appendicitis.  There are three fecaliths seen within the irregular abscess.  The cecum lies in the left mid abdomen.  There is thickening of the mucosa of the adjacent terminal ileum.  The  liver, biliary tree, spleen, pancreas, adrenal glands, and kidneys are normal.  There is no free intraperitoneal air and only a tiny amount of free fluid in the pelvic cul-de-sac.  The uterus and ovaries are normal.  No acute osseous abnormality.  IMPRESSION: Perforated appendicitis with a large periappendiceal abscess.  The appendix and abscess are in the LEFT lower quadrant.   Original Report Authenticated By: Lorriane Shire, M.D.   Ct Guided Abscess Drain  07/19/2012   *RADIOLOGY REPORT*  C T ABSCESS DRAINAGE PLACEMENT  Date: 07/19/2012  Clinical History: 72 year old female with perforated appendicitis and left lower quadrant abscess.  Procedures Performed: 1. CT guided placement of a 14-French drain  Interventional Radiologist:  Criselda Peaches, MD  Sedation: Moderate (conscious) sedation was used.  To mg Versed, 25 mcg Fentanyl were administered intravenously.  The patient's vital signs were monitored continuously by radiology nursing throughout the procedure.  Sedation Time: 10 minutes  Fluoroscopy time: 2 seconds  Contrast volume: None  PROCEDURE/FINDINGS:   Informed consent was obtained from the patient following explanation of the procedure, risks, benefits and alternatives. The patient understands, agrees and consents for the procedure. All questions were addressed. A time out was performed.  Maximal barrier sterile technique utilized including caps, mask, sterile gowns, sterile gloves, large sterile drape, hand hygiene, and betadine skin prep.  A planning axial CT scan was performed.  The left  lower quadrant abscess collection was identified.  A suitable skin entry site was selected and marked.  Local anesthesia was achieved by infiltration of 1% lidocaine.  Under CT fluoroscopic guidance, an 18 gauge trocar needle was advanced over the abscess.  A 0.0350 guidewire was then coiled within the abscess collection.  The tract was serially dilated to 14-French and a Lacinda Axon 31 [place] all-purpose drainage  catheter was advanced into the abscess.  Approximately 200 ml of foul-smelling purulent fluid was then aspirated.  The drain was secured in place with an O Prolene suture and connected to a suction bulb drainage.  A final axial CT scan demonstrated excellent placement of the drain and significant decrease in the size of the abscess collection.  The patient tolerated the procedure well, there was no immediate complication.  IMPRESSION:  Technically successful placement of a 14-French drainage catheter into the left lower quadrant appendiceal abscess.  Signed,  Criselda Peaches, MD Vascular & Interventional Radiologist [facility] Radiology   Original Report Authenticated By: Jacqulynn Cadet, M.D.    Assessment / Plan: 1.  Perforated appendicitis with abscess, aberrant anatomy  Perc drain in place  WBC now normal  IV Invanz  Clear liquids today - likely to advance tomorrow  Will follow closely with you  Earnstine Regal, MD, [facility] Office: [phone]  07/21/2012

## 2012-07-22 LAB — CBC
HCT: 23.1 % — ABNORMAL LOW (ref 36.0–46.0)
Hemoglobin: 7.3 g/dL — ABNORMAL LOW (ref 12.0–15.0)
RBC: 2.7 MIL/uL — ABNORMAL LOW (ref 3.87–5.11)
WBC: 7.7 10*3/uL (ref 4.0–10.5)

## 2012-07-22 LAB — BASIC METABOLIC PANEL
Chloride: 105 mEq/L (ref 96–112)
GFR calc Af Amer: 39 mL/min — ABNORMAL LOW (ref >90)
Potassium: 5.1 mEq/L (ref 3.5–5.1)
Sodium: 134 mEq/L — ABNORMAL LOW (ref 135–145)

## 2012-07-22 LAB — CULTURE, ROUTINE-ABSCESS

## 2012-07-22 LAB — GLUCOSE, CAPILLARY
Glucose-Capillary: 100 mg/dL — ABNORMAL HIGH (ref 70–99)
Glucose-Capillary: 114 mg/dL — ABNORMAL HIGH (ref 70–99)
Glucose-Capillary: 167 mg/dL — ABNORMAL HIGH (ref 70–99)

## 2012-07-22 NOTE — Progress Notes (Signed)
[facility] Progress Note [place] TEAM 1 - Stepdown/ICU TEAM   Laura Best [id number] DOB: Oct 17, 1940 DOA: 07/19/2012 PCP: [name], PA-C  Brief narrative: 72 y/o female who presented to [facility] from her PCPs office with a two-week history of left-sided abdominal pain associated with nausea and constipation. She stated she'd not had a bowel movement in one week. She denied fevers, chest pain, shortness of breath, or chills. She was seen at [facility] 3 days before her admit for similar complaints and found to have renal failure. The LLQ abdominal pain and nausea worsened so she saw her PCP. No history of gastrointestinal problems in the past. She denied any blood in her stools. She endorsed poor appetite and nausea, but no vomiting. Surgery was consulted because her CT scan showed acute appendicitis (L sided cecum/appendix) with perforation and abscess. The patient received IR drain placement in the ED.   Assessment/Plan:  Abscess, appendix (cecum/appendix in LEFT lower quadrant) Status post percutaneous drain per IR - ongoing care per General Surgery - diet advanced to full liquids on 6/30 - IR plans to repeat CT before drain dc'd after drainage down to 12 cc/24 hours  Hypertension Blood pressure is currently reasonably controlled  Acute on chronic renal failure Creatinine 2.0 at presentation - baseline creatinine 1.21 as of March 2014 - continue to hydrate and follow trend which currently is downward - with consistent downward trend in creatinine will discontinue Foley today  Bradycardia HR had been in the 40s upon initial admit to the SDU - rates have since spontaneously improved into the 70 range  Diabetes type 2, uncontrolled CBGs currently well controlled - A1c 7.2  Normocytic anemia Likely due to smoldering infection - goal will be to keep hemoglobin greater than 7.0 - anemia panel consistent with such  Code Status: FULL Family Communication: No family present  at time of visit  Disposition Plan: Transfer to surgical floor - PT/OT  Consultants: Gen Surgery  IR  Procedures: 6/27 - IR placement of drain in appendiceal abscess  Antibiotics: Ertapenem 6/27 >>> Zosyn 6/27  DVT prophylaxis: Lovenox  HPI/Subjective: Awake and sitting in bed - denied significant abdominal pain and has tolerated full liquid diet without difficulty thus far today.  No bowel movement yet.  Passing gas frequently.  Objective: Blood pressure 143/46, pulse 48, temperature 98.8 F (37.1 C), temperature source Oral, resp. rate 23, height 5\' 3"  (1.6 m), weight 68 kg (149 lb 14.6 oz), SpO2 99.00%.  Intake/Output Summary (Last 24 hours) at 07/22/12 1202 Last data filed at 07/22/12 0957  Gross per 24 hour  Intake   1690 ml  Output   1005 ml  Net    685 ml    Exam: General: No acute respiratory distress Lungs: Clear to auscultation bilaterally without wheezes or crackles Cardiovascular:  bradycardic rate and regular rhythm without murmur gallop or rub normal S1 and S2 Abdomen: percutaneous drain is present in the left lower quadrant, the abdomen is slightly protuberant with minimal rebound tenderness but is soft with positive bowel sounds  Extremities: No significant cyanosis, clubbing, edema bilateral lower extremities  Data Reviewed: Basic Metabolic Panel:  Recent Labs Lab 07/16/12 2341 07/19/12 1005 07/19/12 1134 07/19/12 1810 07/20/12 0405 07/21/12 0650 07/22/12 0620  NA 130*  --  134*  --  133* 134* 134*  K 4.5  --  4.8  --  5.0 5.1 5.1  CL 93*  --  100  --  103 106 105  CO2 23  --  21  --  23 21 20   GLUCOSE 92 163* 134*  --  89 136* 130*  BUN 48*  --  46*  --  46* 38* 27*  CREATININE 2.01*  --  1.98* 2.00* 2.17* 1.82* 1.50*  CALCIUM 9.0  --  8.7  --  8.0* 8.1* 7.9*  PHOS  --   --   --   --   --  3.5  --    Liver Function Tests:  Recent Labs Lab 07/19/12 1134 07/20/12 0405 07/21/12 0650  AST 19 17  --   ALT 32 22  --   ALKPHOS 370* 277*   --   BILITOT 0.3 0.2*  --   PROT 7.4 6.0  --   ALBUMIN 2.4* 1.8* 1.8*    Recent Labs Lab 07/19/12 1134  LIPASE 14   CBC:  Recent Labs Lab 07/16/12 2341 07/19/12 1005 07/19/12 1134 07/19/12 1810 07/20/12 0405 07/21/12 0650 07/22/12 0620  WBC 16.3* 19.5* 20.2* 17.7* 14.3* 8.6 7.7  NEUTROABS 13.4* 16.2* 17.1*  --   --   --   --   HGB 9.1* 9.3* 8.4* 8.2* 7.4* 7.5* 7.3*  HCT 26.9* 28.5* 26.0* 25.0* 22.9* 23.5* 23.1*  MCV 84.3 86.4 85.0 85.6 86.1 87.4 85.6  PLT 327 387 412* 389 312 279 288   Cardiac Enzymes:  Recent Labs Lab 07/19/12 1134 07/19/12 1825 07/19/12 2331 07/20/12 0405  TROPONINI <0.30 <0.30 <0.30 <0.30   CBG:  Recent Labs Lab 07/21/12 0726 07/21/12 1135 07/21/12 1722 07/21/12 2105 07/22/12 0750  GLUCAP 124* 143* 137* 131* 131*    Recent Results (from the past 240 hour(s))  CULTURE, ROUTINE-ABSCESS     Status: None   Collection Time    07/19/12  5:33 PM      Result Value Range Status   Specimen Description ABSCESS FLUID   Final   Special Requests PERITONEAL CAVITY   Final   Gram Stain     Final   Value: ABUNDANT WBC PRESENT,BOTH PMN AND MONONUCLEAR     NO SQUAMOUS EPITHELIAL CELLS SEEN     MODERATE GRAM NEGATIVE RODS   Culture ABUNDANT ESCHERICHIA COLI   Final   Report Status 07/22/2012 FINAL   Final   Organism ID, Bacteria ESCHERICHIA COLI   Final  ANAEROBIC CULTURE     Status: None   Collection Time    07/19/12  5:33 PM      Result Value Range Status   Specimen Description ABSCESS FLUID   Final   Special Requests  PERITONEAL CAVITY   Final   Gram Stain     Final   Value: ABUNDANT WBC PRESENT,BOTH PMN AND MONONUCLEAR     NO SQUAMOUS EPITHELIAL CELLS SEEN     MODERATE GRAM NEGATIVE RODS   Culture     Final   Value: NO ANAEROBES ISOLATED; CULTURE IN PROGRESS FOR 5 DAYS   Report Status PENDING   Incomplete  CULTURE, BLOOD (ROUTINE X 2)     Status: None   Collection Time    07/19/12  6:20 PM      Result Value Range Status   Specimen  Description BLOOD ARM RIGHT   Final   Special Requests BOTTLES DRAWN AEROBIC AND ANAEROBIC 10CC   Final   Culture  Setup Time 07/20/2012 01:38   Final   Culture     Final   Value:        BLOOD CULTURE RECEIVED NO GROWTH TO DATE CULTURE WILL BE HELD FOR  5 DAYS BEFORE ISSUING A FINAL NEGATIVE REPORT   Report Status PENDING   Incomplete  CULTURE, BLOOD (ROUTINE X 2)     Status: None   Collection Time    07/19/12  6:30 PM      Result Value Range Status   Specimen Description BLOOD HAND RIGHT   Final   Special Requests BOTTLES DRAWN AEROBIC AND ANAEROBIC 10CC   Final   Culture  Setup Time 07/20/2012 01:37   Final   Culture     Final   Value:        BLOOD CULTURE RECEIVED NO GROWTH TO DATE CULTURE WILL BE HELD FOR 5 DAYS BEFORE ISSUING A FINAL NEGATIVE REPORT   Report Status PENDING   Incomplete     Studies:  Recent x-ray studies have been reviewed in detail by the Attending Physician  Scheduled Meds:  Scheduled Meds: . enoxaparin (LOVENOX) injection  30 mg Subcutaneous Q24H  . ertapenem  1 g Intravenous Q24H  . insulin aspart  0-15 Units Subcutaneous TID WC  . sodium chloride  3 mL Intravenous Q12H    Time spent on care of this patient: 25 minutes   ELLIS,ALLISON L. ANP  [facility] Office  [phone] Pager - Text Page per Shea Evans as per below:  On-Call/Text Page:      [email or web address]      password [email or web address]  If 7PM-7AM, please contact night-coverage [email or web address] Password [email or web address] 07/22/2012, 12:02 PM   LOS: 3 days   I have personally examined this patient and reviewed the entire database. I have reviewed the above note, made any necessary editorial changes, and agree with its content.  Cherene Altes, MD [facility]

## 2012-07-22 NOTE — Progress Notes (Signed)
Agree with above.  Seems to be improving with percutaneous drain.  Will probably need interval appendectomy at some point in the future.    Advance diet Mobilize   [name]. [name], MD, [facility] Surgery  General/ Trauma Surgery  07/22/2012 11:22 AM

## 2012-07-22 NOTE — Progress Notes (Signed)
Utilization Review Completed. 07/22/2012

## 2012-07-22 NOTE — Consult Note (Signed)
Large periappendiceal abscess amenable to percutaneous drainage as described in my other note  Kathryne Eriksson. Dahlia Bailiff, MD, [place] [phone] [facility] Surgery

## 2012-07-22 NOTE — Progress Notes (Signed)
Patient ID: Laura Best, female   DOB: [birth year], 72 y.o.   MRN: [id number] Laura Best [id number] 1940/02/02  CARE TEAM:  PCP: [name], PA-C  Outpatient Care Team: Patient Care Team: Orlena Sheldon, PA-C as PCP - General (Unknown Physician Specialty) Susy Frizzle, MD (Family Medicine)  Inpatient Treatment Team: Treatment Team: Attending Provider: Cherene Altes, MD; Consulting Physician: Nolon Nations, MD; Rounding Team: Sherald Barge, MD; Registered Nurse: Barth Kirks, RN   Subjective: Patient reports she is still "sore" in the LLQ but is getting better. She states she has been out of bed in the chair but has not been able to ambulate due to unsteadiness. +flatus but no BM yet. Patient has been tolerating clear liquids with minimal nausea. She admits to occasional shortness of breath which is relieved with oxygen.   Objective:  Vital signs:  Filed Vitals:   07/22/12 0000 07/22/12 0100 07/22/12 0401 07/22/12 0744  BP: 116/38 121/37  143/46  Pulse: 44 48    Temp: 99.4 F (37.4 C)  99.3 F (37.4 C) 98.8 F (37.1 C)  TempSrc: Oral  Oral Oral  Resp: 19 18  23   Height:      Weight:      SpO2: 98% 97%  99%    Intake/Output   Yesterday:  06/29 0701 - 06/30 0700 In: 2565 [P.O.:360; I.V.:2150; IV Piggyback:50] Out: 1280 [Urine:1225; Drains:55] This shift:  Total I/O In: -  Out: 250 [Urine:250]  Bowel function:  Flatus: +  BM: -  Drain: n/a  Physical Exam:  General: Pt awake/alert/oriented x4 in no acute distress Neuro: CN II-XII intact w/o focal sensory/motor deficits. Psych:  No delerium/psychosis/paranoia HENT: Normocephalic, Mucus membranes moist.  No thrush Neck: Supple, No tracheal deviation Chest: CTA in upper andl ower fields with mild wheezing in lateral fields. No chest wall pain w good excursion CV:  RRR, normal S1/S2, no M/G/R, Pulses intact.  Regular rhythm Abdomen: Soft.  Nondistended.  BS present in all 4 quadrants, Mildly tender in LLQ  only.  No evidence of peritonitis. No incarcerated hernias. Perc drain LLQ abdomen with brownish purulent output. Ext:  No mjr edema.  No cyanosis Skin: No petechiae / purpura   Problem List:   Principal Problem:   Abscess, appendix Active Problems:   Hypertension   Diabetes type 2, uncontrolled    Results:   Labs: Results for orders placed during the hospital encounter of 07/19/12 (from the past 48 hour(s))  GLUCOSE, CAPILLARY     Status: None   Collection Time    07/20/12  8:15 AM      Result Value Range   Glucose-Capillary 79  70 - 99 mg/dL  GLUCOSE, CAPILLARY     Status: Abnormal   Collection Time    07/20/12  5:54 PM      Result Value Range   Glucose-Capillary 141 (*) 70 - 99 mg/dL  GLUCOSE, CAPILLARY     Status: Abnormal   Collection Time    07/20/12  7:47 PM      Result Value Range   Glucose-Capillary 159 (*) 70 - 99 mg/dL  CBC     Status: Abnormal   Collection Time    07/21/12  6:50 AM      Result Value Range   WBC 8.6  4.0 - 10.5 K/uL   RBC 2.69 (*) 3.87 - 5.11 MIL/uL   Hemoglobin 7.5 (*) 12.0 - 15.0 g/dL   HCT 23.5 (*) 36.0 - 46.0 %  MCV 87.4  78.0 - 100.0 fL   MCH 27.9  26.0 - 34.0 pg   MCHC 31.9  30.0 - 36.0 g/dL   RDW 15.2  11.5 - 15.5 %   Platelets 279  150 - 400 K/uL  RENAL FUNCTION PANEL     Status: Abnormal   Collection Time    07/21/12  6:50 AM      Result Value Range   Sodium 134 (*) 135 - 145 mEq/L   Potassium 5.1  3.5 - 5.1 mEq/L   Chloride 106  96 - 112 mEq/L   CO2 21  19 - 32 mEq/L   Glucose, Bld 136 (*) 70 - 99 mg/dL   BUN 38 (*) 6 - 23 mg/dL   Creatinine, Ser 1.82 (*) 0.50 - 1.10 mg/dL   Calcium 8.1 (*) 8.4 - 10.5 mg/dL   Phosphorus 3.5  2.3 - 4.6 mg/dL   Albumin 1.8 (*) 3.5 - 5.2 g/dL   GFR calc non Af Amer 27 (*) >90 mL/min   GFR calc Af Amer 31 (*) >90 mL/min   Comment:            The eGFR has been calculated     using the CKD EPI equation.     This calculation has not been     validated in all clinical     situations.      eGFR's persistently     <90 mL/min signify     possible Chronic Kidney Disease.  VITAMIN B12     Status: None   Collection Time    07/21/12  6:50 AM      Result Value Range   Vitamin B-12 656  211 - 911 pg/mL  FOLATE     Status: None   Collection Time    07/21/12  6:50 AM      Result Value Range   Folate >20.0     Comment: (NOTE)     Reference Ranges            Deficient:       0.4 - 3.3 ng/mL            Indeterminate:   3.4 - 5.4 ng/mL            Normal:              > 5.4 ng/mL  IRON AND TIBC     Status: Abnormal   Collection Time    07/21/12  6:50 AM      Result Value Range   Iron 16 (*) 42 - 135 ug/dL   TIBC 113 (*) 250 - 470 ug/dL   Saturation Ratios 14 (*) 20 - 55 %   UIBC 97 (*) 125 - 400 ug/dL  FERRITIN     Status: Abnormal   Collection Time    07/21/12  6:50 AM      Result Value Range   Ferritin 539 (*) 10 - 291 ng/mL  RETICULOCYTES     Status: Abnormal   Collection Time    07/21/12  6:50 AM      Result Value Range   Retic Ct Pct 0.7  0.4 - 3.1 %   RBC. 2.69 (*) 3.87 - 5.11 MIL/uL   Retic Count, Manual 18.8 (*) 19.0 - 186.0 K/uL  GLUCOSE, CAPILLARY     Status: Abnormal   Collection Time    07/21/12  7:26 AM      Result Value Range   Glucose-Capillary 124 (*) 70 -  99 mg/dL  GLUCOSE, CAPILLARY     Status: Abnormal   Collection Time    07/21/12 11:35 AM      Result Value Range   Glucose-Capillary 143 (*) 70 - 99 mg/dL  GLUCOSE, CAPILLARY     Status: Abnormal   Collection Time    07/21/12  5:22 PM      Result Value Range   Glucose-Capillary 137 (*) 70 - 99 mg/dL  GLUCOSE, CAPILLARY     Status: Abnormal   Collection Time    07/21/12  9:05 PM      Result Value Range   Glucose-Capillary 131 (*) 70 - 99 mg/dL  BASIC METABOLIC PANEL     Status: Abnormal   Collection Time    07/22/12  6:20 AM      Result Value Range   Sodium 134 (*) 135 - 145 mEq/L   Potassium 5.1  3.5 - 5.1 mEq/L   Chloride 105  96 - 112 mEq/L   CO2 20  19 - 32 mEq/L   Glucose,  Bld 130 (*) 70 - 99 mg/dL   BUN 27 (*) 6 - 23 mg/dL   Creatinine, Ser 1.50 (*) 0.50 - 1.10 mg/dL   Calcium 7.9 (*) 8.4 - 10.5 mg/dL   GFR calc non Af Amer 34 (*) >90 mL/min   GFR calc Af Amer 39 (*) >90 mL/min   Comment:            The eGFR has been calculated     using the CKD EPI equation.     This calculation has not been     validated in all clinical     situations.     eGFR's persistently     <90 mL/min signify     possible Chronic Kidney Disease.  CBC     Status: Abnormal   Collection Time    07/22/12  6:20 AM      Result Value Range   WBC 7.7  4.0 - 10.5 K/uL   RBC 2.70 (*) 3.87 - 5.11 MIL/uL   Hemoglobin 7.3 (*) 12.0 - 15.0 g/dL   HCT 23.1 (*) 36.0 - 46.0 %   MCV 85.6  78.0 - 100.0 fL   MCH 27.0  26.0 - 34.0 pg   MCHC 31.6  30.0 - 36.0 g/dL   RDW 14.7  11.5 - 15.5 %   Platelets 288  150 - 400 K/uL    Imaging / Studies: No results found.  Medications / Allergies: per chart  Antibiotics: Anti-infectives   Start     Dose/Rate Route Frequency Ordered Stop   07/20/12 1500  vancomycin (VANCOCIN) IVPB 1000 mg/200 mL premix  Status:  Discontinued     1,000 mg 200 mL/hr over 60 Minutes Intravenous Every 24 hours 07/20/12 1347 07/20/12 1434   07/19/12 1800  vancomycin (VANCOCIN) IVPB 1000 mg/200 mL premix  Status:  Discontinued     1,000 mg 200 mL/hr over 60 Minutes Intravenous Every 24 hours 07/19/12 1752 07/20/12 1347   07/19/12 1545  ertapenem (INVANZ) 1 g in sodium chloride 0.9 % 50 mL IVPB     1 g 100 mL/hr over 30 Minutes Intravenous Every 24 hours 07/19/12 1537     07/19/12 1430  piperacillin-tazobactam (ZOSYN) IVPB 3.375 g     3.375 g 12.5 mL/hr over 240 Minutes Intravenous  Once 07/19/12 1426 07/19/12 1519      Assessment/Plan Laura Best  72 y.o. female   Perforated appendicitis with abscess, aberrant  anatomy -perc drain in place placed by IR on 07/19/12, 27mL over 24 hours of purulent drainage -pain is improving as expected -WBC is normal -Invanz  (started on 07/19/12). Preliminary cultures with gram negative rods.  -Advance diet to fulls -Ambulate, will order PT eval due to patient feeling unstable   Problems managed by internal medicine Hypertension  Acute on chronic renal failure  Bradycardia  Diabetes type 2, uncontrolled  Normocytic anemia  Erby Pian, [facility] Surgery Pager [phone] Office [phone]  07/22/2012

## 2012-07-22 NOTE — Progress Notes (Signed)
  Subjective: LLQ abscess drain placed 6/27 Pt feeling some better  Objective: Vital signs in last 24 hours: Temp:  [98.4 F (36.9 C)-99.4 F (37.4 C)] 98.8 F (37.1 C) (06/30 0744) Pulse Rate:  [43-115] 48 (06/30 0100) Resp:  [9-24] 23 (06/30 0744) BP: (116-144)/(37-76) 143/46 mmHg (06/30 0744) SpO2:  [95 %-100 %] 99 % (06/30 0744)    Intake/Output from previous day: 06/29 0701 - 06/30 0700 In: 2565 [P.O.:360; I.V.:2150; IV Piggyback:50] Out: 1280 [Urine:1225; Drains:55] Intake/Output this shift: Total I/O In: -  Out: 250 [Urine:250]  PE:  Afeb; vss Wbc wnl Output 55 cc yesterday 15 cc in JP now- serous  Site clean and dry; NT now Cx:  G- rods   Lab Results:   Recent Labs  07/21/12 0650 07/22/12 0620  WBC 8.6 7.7  HGB 7.5* 7.3*  HCT 23.5* 23.1*  PLT 279 288   BMET  Recent Labs  07/21/12 0650 07/22/12 0620  NA 134* 134*  K 5.1 5.1  CL 106 105  CO2 21 20  GLUCOSE 136* 130*  BUN 38* 27*  CREATININE 1.82* 1.50*  CALCIUM 8.1* 7.9*   PT/INR  Recent Labs  07/20/12 0405  LABPROT 14.6  INR 1.16   ABG No results found for this basename: PHART, PCO2, PO2, HCO3,  in the last 72 hours  Studies/Results: No results found.  Anti-infectives:   Assessment/Plan: s/p * No surgery found *  LLQ abscess drain placed 6/27 Doing well Will need Re CT before pull--when output under 15 cc/24 hrs   LOS: 3 days    Nadir Vasques A 07/22/2012

## 2012-07-23 ENCOUNTER — Encounter (HOSPITAL_COMMUNITY): Payer: Self-pay

## 2012-07-23 DIAGNOSIS — R109 Unspecified abdominal pain: Secondary | ICD-10-CM

## 2012-07-23 DIAGNOSIS — K3533 Acute appendicitis with perforation and localized peritonitis, with abscess: Principal | ICD-10-CM

## 2012-07-23 LAB — BASIC METABOLIC PANEL
BUN: 17 mg/dL (ref 6–23)
Calcium: 8.5 mg/dL (ref 8.4–10.5)
GFR calc non Af Amer: 44 mL/min — ABNORMAL LOW (ref >90)
Glucose, Bld: 157 mg/dL — ABNORMAL HIGH (ref 70–99)

## 2012-07-23 LAB — GLUCOSE, CAPILLARY
Glucose-Capillary: 124 mg/dL — ABNORMAL HIGH (ref 70–99)
Glucose-Capillary: 152 mg/dL — ABNORMAL HIGH (ref 70–99)

## 2012-07-23 LAB — CBC
HCT: 26.1 % — ABNORMAL LOW (ref 36.0–46.0)
Hemoglobin: 8.3 g/dL — ABNORMAL LOW (ref 12.0–15.0)
MCH: 27.3 pg (ref 26.0–34.0)
MCHC: 31.8 g/dL (ref 30.0–36.0)
RDW: 14.7 % (ref 11.5–15.5)

## 2012-07-23 MED ORDER — AMOXICILLIN-POT CLAVULANATE 500-125 MG PO TABS
1.0000 | ORAL_TABLET | Freq: Two times a day (BID) | ORAL | Status: DC
  Administered 2012-07-23 (×2): 500 mg via ORAL
  Filled 2012-07-23 (×5): qty 1

## 2012-07-23 MED ORDER — PNEUMOCOCCAL VAC POLYVALENT 25 MCG/0.5ML IJ INJ
0.5000 mL | INJECTION | INTRAMUSCULAR | Status: AC
  Filled 2012-07-23: qty 0.5

## 2012-07-23 MED ORDER — HYDROCODONE-ACETAMINOPHEN 5-325 MG PO TABS
1.0000 | ORAL_TABLET | Freq: Four times a day (QID) | ORAL | Status: DC | PRN
Start: 2012-07-23 — End: 2012-07-25

## 2012-07-23 NOTE — Progress Notes (Signed)
Doing better.  Regained bowel function  PO abx.  Possible discharge tomorrow.  [name]. [name], MD, [facility] Surgery  General/ Trauma Surgery  07/23/2012 1:46 PM

## 2012-07-23 NOTE — Progress Notes (Signed)
ANTIBIOTIC CONSULT NOTE - INITIAL  Pharmacy Consult for augmentin x 2 weeks Indication: appendicitis with abscess s/p Perc Drainage     Allergies  Allergen Reactions  . Aspirin   . Metformin And Related Diarrhea    Patient Measurements: Height: 5\' 3"  (160 cm) Weight: 149 lb 14.6 oz (68 kg) IBW/kg (Calculated) : 52.4   Vital Signs: Temp: 98.5 F (36.9 C) (07/01 0700) Temp src: Oral (07/01 0700) BP: 143/50 mmHg (07/01 0902) Pulse Rate: 74 (07/01 0902) Intake/Output from previous day: 06/30 0701 - 07/01 0700 In: 1750 [P.O.:360; I.V.:1385] Out: 855 [Urine:800; Drains:55] Intake/Output from this shift: Total I/O In: 240 [P.O.:240] Out: 250 [Urine:250]  Labs:  Recent Labs  07/21/12 0650 07/22/12 0620  WBC 8.6 7.7  HGB 7.5* 7.3*  PLT 279 288  CREATININE 1.82* 1.50*   Estimated Creatinine Clearance: 31.8 ml/min (by C-G formula based on Cr of 1.5). No results found for this basename: VANCOTROUGH, VANCOPEAK, VANCORANDOM, GENTTROUGH, GENTPEAK, GENTRANDOM, TOBRATROUGH, TOBRAPEAK, TOBRARND, AMIKACINPEAK, AMIKACINTROU, AMIKACIN,  in the last 72 hours   Microbiology: Recent Results (from the past 720 hour(s))  CULTURE, ROUTINE-ABSCESS     Status: None   Collection Time    07/19/12  5:33 PM      Result Value Range Status   Specimen Description ABSCESS FLUID   Final   Special Requests PERITONEAL CAVITY   Final   Gram Stain     Final   Value: ABUNDANT WBC PRESENT,BOTH PMN AND MONONUCLEAR     NO SQUAMOUS EPITHELIAL CELLS SEEN     MODERATE GRAM NEGATIVE RODS   Culture ABUNDANT ESCHERICHIA COLI   Final   Report Status 07/22/2012 FINAL   Final   Organism ID, Bacteria ESCHERICHIA COLI   Final  ANAEROBIC CULTURE     Status: None   Collection Time    07/19/12  5:33 PM      Result Value Range Status   Specimen Description ABSCESS FLUID   Final   Special Requests  PERITONEAL CAVITY   Final   Gram Stain     Final   Value: ABUNDANT WBC PRESENT,BOTH PMN AND MONONUCLEAR     NO  SQUAMOUS EPITHELIAL CELLS SEEN     MODERATE GRAM NEGATIVE RODS   Culture     Final   Value: NO ANAEROBES ISOLATED; CULTURE IN PROGRESS FOR 5 DAYS   Report Status PENDING   Incomplete  CULTURE, BLOOD (ROUTINE X 2)     Status: None   Collection Time    07/19/12  6:20 PM      Result Value Range Status   Specimen Description BLOOD ARM RIGHT   Final   Special Requests BOTTLES DRAWN AEROBIC AND ANAEROBIC 10CC   Final   Culture  Setup Time 07/20/2012 01:38   Final   Culture     Final   Value:        BLOOD CULTURE RECEIVED NO GROWTH TO DATE CULTURE WILL BE HELD FOR 5 DAYS BEFORE ISSUING A FINAL NEGATIVE REPORT   Report Status PENDING   Incomplete  CULTURE, BLOOD (ROUTINE X 2)     Status: None   Collection Time    07/19/12  6:30 PM      Result Value Range Status   Specimen Description BLOOD HAND RIGHT   Final   Special Requests BOTTLES DRAWN AEROBIC AND ANAEROBIC 10CC   Final   Culture  Setup Time 07/20/2012 01:37   Final   Culture     Final   Value:  BLOOD CULTURE RECEIVED NO GROWTH TO DATE CULTURE WILL BE HELD FOR 5 DAYS BEFORE ISSUING A FINAL NEGATIVE REPORT   Report Status PENDING   Incomplete    Medical History: Past Medical History  Diagnosis Date  . Diabetes mellitus   . Hypertension   . Hyperlipidemia   . Tricuspid regurgitation     Echo 05/02/06-Nml LV. Mild MR. Mod TR  . Osteoporosis   . Diastolic heart failure     Medications:  Prescriptions prior to admission  Medication Sig Dispense Refill  . acetaminophen (TYLENOL) 500 MG tablet Take 500 mg by mouth every 6 (six) hours as needed for pain.      Marland Kitchen amLODipine (NORVASC) 10 MG tablet Take 1 tablet (10 mg total) by mouth daily.  90 tablet  1  . cloNIDine (CATAPRES - DOSED IN MG/24 HR) 0.1 mg/24hr patch Place 2 patches onto the skin once a week. APPLIED ON TUESDAYS      . glipiZIDE (GLUCOTROL) 5 MG tablet Take 1 tablet (5 mg total) by mouth daily.  30 tablet  2  . hydrochlorothiazide (MICROZIDE) 12.5 MG capsule Take 1  capsule (12.5 mg total) by mouth daily.  30 capsule  6  . insulin glargine (LANTUS) 100 UNIT/ML injection Inject 0.15 mLs (15 Units total) into the skin every morning.  10 mL  2  . lisinopril (PRINIVIL,ZESTRIL) 20 MG tablet Take 20-40 mg by mouth 2 (two) times daily. TAKES TWO TABLETS IN THE MORNING AND ONE TABLET AT BEDTIME       Assessment: 72 yo F POD #4 s/p perf appendix w/ abscess to transition from ertapenem to po augmentin per pharmacy.   AF.  WBC 7.7 on 6/30.  Creat 1.5. Creat cl ~ 30 ml/min.  6/27 abscess fluid = abundant E coli - sensitive to all abx tested x bactrim. 6/28 BC x 2 NGTD   Goal of Therapy:  Eradicate infection  Plan:  1. augmentin 500 mg po BID x 2 weeks Eudelia Bunch, Pharm.D. [id number] 07/23/2012 10:28 AM

## 2012-07-23 NOTE — Progress Notes (Signed)
Agree with PA note.    Signed,  Olukemi Panchal K. Louine Tenpenny, MD Vascular & Interventional Radiologist [facility]  

## 2012-07-23 NOTE — Progress Notes (Signed)
Subjective: 72 yo WF sitting comfortably in bedside chair. Technician changing linens in room. Pt reports having large, brown-green liquid BM this morning. She denies pain or hematochezia with the BM. Continues to have flatus.   Some soreness of abdominal LLQPain is well controlled with minimal to no use of IV Dilaudid. Pain is 3/10 without pain meds. On soft diet, tolerating well and able to ambulate to restroom with assistance. Denies any nausea, dysuria, hematuria, or fever.   Reports dyspnea on exertion, relieved with nasal O2 canula. Will practice deep breathing and walking today.   Objective: Vital signs in last 24 hours: Temp:  [98.3 F (36.8 C)-98.9 F (37.2 C)] 98.5 F (36.9 C) (07/01 0700) Pulse Rate:  [58-98] 74 (07/01 0902) Resp:  [16-25] 18 (07/01 0902) BP: (115-162)/(45-65) 143/50 mmHg (07/01 0902) SpO2:  [95 %-98 %] 95 % (07/01 0902) Last BM Date: 07/22/12  Intake/Output from previous day: 06/30 0701 - 07/01 0700 In: 1750 [P.O.:360; I.V.:1385] Out: 855 [Urine:800; Drains:55] Intake/Output this shift:    PE: Gen:  Alert, NAD, pleasant Card:  RRR, no M/G/R heard Pulm:  CTA, no W/R/R Abd: Soft, NT, mild distension in lower abdomen +BS, no HSM, drain with minimal sanguinous drainage, midline abdominal scar below umbilicus and  1.5 cm horizontal scars below breasts noted  Lab Results:    Recent Labs  07/21/12 0650 07/22/12 0620  WBC 8.6 7.7  HGB 7.5* 7.3*  HCT 23.5* 23.1*  PLT 279 288   BMET  Recent Labs  07/21/12 0650 07/22/12 0620  NA 134* 134*  K 5.1 5.1  CL 106 105  CO2 21 20  GLUCOSE 136* 130*  BUN 38* 27*  CREATININE 1.82* 1.50*  CALCIUM 8.1* 7.9*   PT/INR No results found for this basename: LABPROT, INR,  in the last 72 hours CMP     Component Value Date/Time   NA 134* 07/22/2012 0620   K 5.1 07/22/2012 0620   CL 105 07/22/2012 0620   CO2 20 07/22/2012 0620   GLUCOSE 130* 07/22/2012 0620   GLUCOSE 163* 07/19/2012 1005   BUN 27*  07/22/2012 0620   CREATININE 1.50* 07/22/2012 0620   CREATININE 1.21* 04/01/2012 0000   CALCIUM 7.9* 07/22/2012 0620   PROT 6.0 07/20/2012 0405   ALBUMIN 1.8* 07/21/2012 0650   AST 17 07/20/2012 0405   ALT 22 07/20/2012 0405   ALKPHOS 277* 07/20/2012 0405   BILITOT 0.2* 07/20/2012 0405   GFRNONAA 34* 07/22/2012 0620   GFRAA 39* 07/22/2012 0620   Lipase     Component Value Date/Time   LIPASE 14 07/19/2012 1134       Studies/Results: No results found.  Anti-infectives: Anti-infectives   Start     Dose/Rate Route Frequency Ordered Stop   07/20/12 1500  vancomycin (VANCOCIN) IVPB 1000 mg/200 mL premix  Status:  Discontinued     1,000 mg 200 mL/hr over 60 Minutes Intravenous Every 24 hours 07/20/12 1347 07/20/12 1434   07/19/12 1800  vancomycin (VANCOCIN) IVPB 1000 mg/200 mL premix  Status:  Discontinued     1,000 mg 200 mL/hr over 60 Minutes Intravenous Every 24 hours 07/19/12 1752 07/20/12 1347   07/19/12 1545  ertapenem (INVANZ) 1 g in sodium chloride 0.9 % 50 mL IVPB     1 g 100 mL/hr over 30 Minutes Intravenous Every 24 hours 07/19/12 1537     07/19/12 1430  piperacillin-tazobactam (ZOSYN) IVPB 3.375 g     3.375 g 12.5 mL/hr over 240 Minutes Intravenous  Once 07/19/12 1426 07/19/12 1519       Assessment/Plan POD #4 Perforated appendicitis with abscess, aberrant anatomy   -perc drain in place placed by IR on 07/19/12, 57mL over 24 hours of  purulent drainage   -pain is improving as expected   - Vicodin PO for pain control  -WBC is normal   -Invanz (started on 07/19/12). Preliminary cultures with gram negative rods.   -Pharmacy consult to dose Augmentin PO for 2 weeks  -Advance to soft diet  -Nursing staff to train on proper drain care  -Waiting for PT eval due to patient feeling unstable   -Ok for discharge from surgical standpoint   -Patient has established Home Health Aid   Problems managed by internal medicine  Hypertension  Acute on chronic renal failure   Bradycardia  Diabetes type 2, uncontrolled  Normocytic anemia   LOS: 4 days    Bard Herbert, PA-S  07/23/2012, 9:06 AM

## 2012-07-23 NOTE — Progress Notes (Signed)
[facility] PROGRESS NOTE  MEREL LAFORTE [id number] DOB: 20-Oct-1940 DOA: 07/19/2012 PCP: Dena Billet BETH, PA-C  Assessment/Plan: Abscess, appendix (cecum/appendix in LEFT lower quadrant)  Status post percutaneous drain per IR - ongoing care per General Surgery - diet advanced to dysphagia 3 with thin liquids on 7/1  - IR plans to repeat CT before drain dc'd after drainage down to less than 15 cc/24 hours  Hypertension  Blood pressure is currently reasonably controlled  Acute on chronic renal failure  -Creatinine 2.0 at presentation - baseline creatinine 1.21 as of March 2014  -Cr continues to trend towards baseline Bradycardia  Stable Diabetes type 2, uncontrolled  CBGs currently well controlled  - A1c 7.2  Normocytic anemia  Likely due to smoldering infection - goal will be to keep hemoglobin greater than 7.0 - anemia panel consistent with such  Code Status: Full Family Communication: Pt in room (indicate person spoken with, relationship, and if by phone, the number) Disposition Plan: Poss d/c tomorrow  Consultants:  General surgery  Procedures: 6/27 - IR placement of drain in appendiceal abscess  Antibiotics: Ertapenem 6/27 >>>  Zosyn 6/27  HPI/Subjective: Feels well. No complaints  Objective: Filed Vitals:   07/23/12 0902 07/23/12 1239 07/23/12 1241 07/23/12 1242  BP: 143/50 149/48 156/57 158/57  Pulse: 74 67    Temp:  98.1 F (36.7 C)    TempSrc:  Oral    Resp: 18 18    Height:      Weight:      SpO2: 95% 95% 94% 96%    Intake/Output Summary (Last 24 hours) at 07/23/12 1426 Last data filed at 07/23/12 1345  Gross per 24 hour  Intake   1880 ml  Output    585 ml  Net   1295 ml   Filed Weights   07/19/12 1859 07/21/12 0511  Weight: 65.4 kg (144 lb 2.9 oz) 68 kg (149 lb 14.6 oz)    Exam:   General:  Awake, in nad  Cardiovascular: regular, s1, s2  Respiratory: normal resp effort,no wheezing  Abdomen: soft,  nondistended  Musculoskeletal: perfused, no clubbing   Data Reviewed: Basic Metabolic Panel:  Recent Labs Lab 07/16/12 2341 07/19/12 1005 07/19/12 1134 07/19/12 1810 07/20/12 0405 07/21/12 0650 07/22/12 0620  NA 130*  --  134*  --  133* 134* 134*  K 4.5  --  4.8  --  5.0 5.1 5.1  CL 93*  --  100  --  103 106 105  CO2 23  --  21  --  23 21 20   GLUCOSE 92 163* 134*  --  89 136* 130*  BUN 48*  --  46*  --  46* 38* 27*  CREATININE 2.01*  --  1.98* 2.00* 2.17* 1.82* 1.50*  CALCIUM 9.0  --  8.7  --  8.0* 8.1* 7.9*  PHOS  --   --   --   --   --  3.5  --    Liver Function Tests:  Recent Labs Lab 07/19/12 1134 07/20/12 0405 07/21/12 0650  AST 19 17  --   ALT 32 22  --   ALKPHOS 370* 277*  --   BILITOT 0.3 0.2*  --   PROT 7.4 6.0  --   ALBUMIN 2.4* 1.8* 1.8*    Recent Labs Lab 07/19/12 1134  LIPASE 14   No results found for this basename: AMMONIA,  in the last 168 hours CBC:  Recent Labs Lab 07/16/12 2341 07/19/12 1005 07/19/12 1134  07/19/12 1810 07/20/12 0405 07/21/12 0650 07/22/12 0620  WBC 16.3* 19.5* 20.2* 17.7* 14.3* 8.6 7.7  NEUTROABS 13.4* 16.2* 17.1*  --   --   --   --   HGB 9.1* 9.3* 8.4* 8.2* 7.4* 7.5* 7.3*  HCT 26.9* 28.5* 26.0* 25.0* 22.9* 23.5* 23.1*  MCV 84.3 86.4 85.0 85.6 86.1 87.4 85.6  PLT 327 387 412* 389 312 279 288   Cardiac Enzymes:  Recent Labs Lab 07/19/12 1134 07/19/12 1825 07/19/12 2331 07/20/12 0405  TROPONINI <0.30 <0.30 <0.30 <0.30   BNP (last 3 results) No results found for this basename: PROBNP,  in the last 8760 hours CBG:  Recent Labs Lab 07/22/12 1236 07/22/12 1726 07/22/12 2207 07/23/12 0743 07/23/12 1226  GLUCAP 114* 167* 110* 124* 139*    Recent Results (from the past 240 hour(s))  CULTURE, ROUTINE-ABSCESS     Status: None   Collection Time    07/19/12  5:33 PM      Result Value Range Status   Specimen Description ABSCESS FLUID   Final   Special Requests PERITONEAL CAVITY   Final   Gram Stain      Final   Value: ABUNDANT WBC PRESENT,BOTH PMN AND MONONUCLEAR     NO SQUAMOUS EPITHELIAL CELLS SEEN     MODERATE GRAM NEGATIVE RODS   Culture ABUNDANT ESCHERICHIA COLI   Final   Report Status 07/22/2012 FINAL   Final   Organism ID, Bacteria ESCHERICHIA COLI   Final  ANAEROBIC CULTURE     Status: None   Collection Time    07/19/12  5:33 PM      Result Value Range Status   Specimen Description ABSCESS FLUID   Final   Special Requests  PERITONEAL CAVITY   Final   Gram Stain     Final   Value: ABUNDANT WBC PRESENT,BOTH PMN AND MONONUCLEAR     NO SQUAMOUS EPITHELIAL CELLS SEEN     MODERATE GRAM NEGATIVE RODS   Culture     Final   Value: NO ANAEROBES ISOLATED; CULTURE IN PROGRESS FOR 5 DAYS   Report Status PENDING   Incomplete  CULTURE, BLOOD (ROUTINE X 2)     Status: None   Collection Time    07/19/12  6:20 PM      Result Value Range Status   Specimen Description BLOOD ARM RIGHT   Final   Special Requests BOTTLES DRAWN AEROBIC AND ANAEROBIC 10CC   Final   Culture  Setup Time 07/20/2012 01:38   Final   Culture     Final   Value:        BLOOD CULTURE RECEIVED NO GROWTH TO DATE CULTURE WILL BE HELD FOR 5 DAYS BEFORE ISSUING A FINAL NEGATIVE REPORT   Report Status PENDING   Incomplete  CULTURE, BLOOD (ROUTINE X 2)     Status: None   Collection Time    07/19/12  6:30 PM      Result Value Range Status   Specimen Description BLOOD HAND RIGHT   Final   Special Requests BOTTLES DRAWN AEROBIC AND ANAEROBIC 10CC   Final   Culture  Setup Time 07/20/2012 01:37   Final   Culture     Final   Value:        BLOOD CULTURE RECEIVED NO GROWTH TO DATE CULTURE WILL BE HELD FOR 5 DAYS BEFORE ISSUING A FINAL NEGATIVE REPORT   Report Status PENDING   Incomplete     Studies: No results found.  Scheduled Meds: .  amoxicillin-clavulanate  1 tablet Oral BID  . enoxaparin (LOVENOX) injection  30 mg Subcutaneous Q24H  . insulin aspart  0-15 Units Subcutaneous TID WC  . sodium chloride  3 mL Intravenous  Q12H   Continuous Infusions: . sodium chloride 20 mL/hr at 07/23/12 1003    Principal Problem:   Abscess, appendix Active Problems:   Hypertension   Diabetes type 2, uncontrolled    Time spent: 21min    [facility] Pager [phone]. If 7PM-7AM, please contact night-coverage at [email or web address], password [facility] 07/23/2012, 2:26 PM  LOS: 4 days

## 2012-07-23 NOTE — Progress Notes (Signed)
Patient is currently active with long-term disease management services with [facility]. Patient will receive a post discharge transition of care call and continued monthly home visits for assessments and for education. Met with Ms Nease who reports she plans to return home with home health services. States she will also need a "potty seat". Spoke with inpatient RNCM who confirms discharge plans. Made Ms Graft aware that her [facility] will follow up upon discharge.   Marthenia Rolling, MSN-Ed, RN,BSN, [facility], [phone]

## 2012-07-23 NOTE — Progress Notes (Signed)
Subjective: Pt doing well; possible d/c home with drain tomorrow per [facility]  Objective: Vital signs in last 24 hours: Temp:  [98.1 F (36.7 C)-98.7 F (37.1 C)] 98.4 F (36.9 C) (07/01 1242) Pulse Rate:  [59-98] 67 (07/01 1239) Resp:  [17-18] 18 (07/01 1239) BP: (115-162)/(46-65) 158/57 mmHg (07/01 1242) SpO2:  [94 %-98 %] 96 % (07/01 1242) Last BM Date: 07/23/12  Intake/Output from previous day: 06/30 0701 - 07/01 0700 In: 1750 [P.O.:360; I.V.:1385] Out: 855 [Urine:800; Drains:55] Intake/Output this shift: Total I/O In: 495 [P.O.:490; Other:5] Out: 260 [Urine:250; Drains:10]  LLQ drain intact, output 10-15 cc's today tan fluid, cx's - E COLI, insertion site ok, mildly tender  Lab Results:   Recent Labs  07/22/12 0620 07/23/12 1438  WBC 7.7 7.5  HGB 7.3* 8.3*  HCT 23.1* 26.1*  PLT 288 336   BMET  Recent Labs  07/22/12 0620 07/23/12 1438  NA 134* 138  K 5.1 4.6  CL 105 108  CO2 20 21  GLUCOSE 130* 157*  BUN 27* 17  CREATININE 1.50* 1.20*  CALCIUM 7.9* 8.5   PT/INR No results found for this basename: LABPROT, INR,  in the last 72 hours ABG No results found for this basename: PHART, PCO2, PO2, HCO3,  in the last 72 hours Results for orders placed during the hospital encounter of 07/19/12  CULTURE, ROUTINE-ABSCESS     Status: None   Collection Time    07/19/12  5:33 PM      Result Value Range Status   Specimen Description ABSCESS FLUID   Final   Special Requests PERITONEAL CAVITY   Final   Gram Stain     Final   Value: ABUNDANT WBC PRESENT,BOTH PMN AND MONONUCLEAR     NO SQUAMOUS EPITHELIAL CELLS SEEN     MODERATE GRAM NEGATIVE RODS   Culture ABUNDANT ESCHERICHIA COLI   Final   Report Status 07/22/2012 FINAL   Final   Organism ID, Bacteria ESCHERICHIA COLI   Final  ANAEROBIC CULTURE     Status: None   Collection Time    07/19/12  5:33 PM      Result Value Range Status   Specimen Description ABSCESS FLUID   Final   Special Requests  PERITONEAL  CAVITY   Final   Gram Stain     Final   Value: ABUNDANT WBC PRESENT,BOTH PMN AND MONONUCLEAR     NO SQUAMOUS EPITHELIAL CELLS SEEN     MODERATE GRAM NEGATIVE RODS   Culture     Final   Value: NO ANAEROBES ISOLATED; CULTURE IN PROGRESS FOR 5 DAYS   Report Status PENDING   Incomplete  CULTURE, BLOOD (ROUTINE X 2)     Status: None   Collection Time    07/19/12  6:20 PM      Result Value Range Status   Specimen Description BLOOD ARM RIGHT   Final   Special Requests BOTTLES DRAWN AEROBIC AND ANAEROBIC 10CC   Final   Culture  Setup Time 07/20/2012 01:38   Final   Culture     Final   Value:        BLOOD CULTURE RECEIVED NO GROWTH TO DATE CULTURE WILL BE HELD FOR 5 DAYS BEFORE ISSUING A FINAL NEGATIVE REPORT   Report Status PENDING   Incomplete  CULTURE, BLOOD (ROUTINE X 2)     Status: None   Collection Time    07/19/12  6:30 PM      Result Value Range Status  Specimen Description BLOOD HAND RIGHT   Final   Special Requests BOTTLES DRAWN AEROBIC AND ANAEROBIC 10CC   Final   Culture  Setup Time 07/20/2012 01:37   Final   Culture     Final   Value:        BLOOD CULTURE RECEIVED NO GROWTH TO DATE CULTURE WILL BE HELD FOR 5 DAYS BEFORE ISSUING A FINAL NEGATIVE REPORT   Report Status PENDING   Incomplete    Studies/Results: No results found.  Anti-infectives: Anti-infectives   Start     Dose/Rate Route Frequency Ordered Stop   07/23/12 1200  amoxicillin-clavulanate (AUGMENTIN) 500-125 MG per tablet 500 mg     1 tablet Oral 2 times daily 07/23/12 1026 08/06/12 0959   07/20/12 1500  vancomycin (VANCOCIN) IVPB 1000 mg/200 mL premix  Status:  Discontinued     1,000 mg 200 mL/hr over 60 Minutes Intravenous Every 24 hours 07/20/12 1347 07/20/12 1434   07/19/12 1800  vancomycin (VANCOCIN) IVPB 1000 mg/200 mL premix  Status:  Discontinued     1,000 mg 200 mL/hr over 60 Minutes Intravenous Every 24 hours 07/19/12 1752 07/20/12 1347   07/19/12 1545  ertapenem (INVANZ) 1 g in sodium chloride 0.9  % 50 mL IVPB  Status:  Discontinued     1 g 100 mL/hr over 30 Minutes Intravenous Every 24 hours 07/19/12 1537 07/23/12 0948   07/19/12 1430  piperacillin-tazobactam (ZOSYN) IVPB 3.375 g     3.375 g 12.5 mL/hr over 240 Minutes Intravenous  Once 07/19/12 1426 07/19/12 1519      Assessment/Plan: s/p LLQ periappendiceal abscess drainage 6/27; cont current tx; if pt goes home rec once daily flushing of drain with 5 cc' sterile NS, recording of output and daily dressing changes; will need f/u CT later this week with poss drain injection to r/o fistula before drain removal ; if [facility] would prefer bag drainage as opposed to JP suction contact our office at [phone] prior to d/c  LOS: 4 days    Jadia Capers,D [facility] 07/23/2012

## 2012-07-23 NOTE — Evaluation (Signed)
Physical Therapy Evaluation Patient Details Name: Laura Best MRN: [id number] DOB: 06/17/1940 Today's Date: 07/23/2012 Time: [id number] PT Time Calculation (min): 42 min  PT Assessment / Plan / Recommendation History of Present Illness  3 week h/o of on/off sweats and abdominal pain  Clinical Impression  Pt admitted with abd pain and found to have appendicitis and perforation with abscess periappendiceal.. Pt currently with functional limitations due to the deficits listed below (see PT Problem List).  Pt will benefit from skilled PT to increase their independence and safety with mobility to allow discharge home in the care of her daughter in law.      PT Assessment  Patient needs continued PT services    Follow Up Recommendations  Home health PT;Supervision for mobility/OOB    Does the patient have the potential to tolerate intense rehabilitation      Barriers to Discharge        Equipment Recommendations  3in1 (PT)    Recommendations for Other Services     Frequency Min 3X/week    Precautions / Restrictions Precautions Precautions: Fall Restrictions Weight Bearing Restrictions: No   Pertinent Vitals/Pain       Mobility  Bed Mobility Bed Mobility: Not assessed Transfers Transfers: Sit to Stand;Stand to Sit Sit to Stand: 4: Min guard;4: Min assist;With upper extremity assist;From chair/3-in-1 Stand to Sit: 4: Min guard;4: Min assist;With upper extremity assist;To chair/3-in-1 Details for Transfer Assistance: needed min due to period of distress when ambulating otherwise min guard. Ambulation/Gait Ambulation/Gait Assistance: 4: Min assist Ambulation Distance (Feet): 100 Feet (then 70 feet after she recovered from  her episode.) Assistive device: Other (Comment) (iv pole) Ambulation/Gait Assistance Details: very guarded and mildly unsteady, but degraded gait stability when she became less responsive (flat of affect for unknow reason) Gait Pattern: Step-through  pattern Stairs: No Wheelchair Mobility Wheelchair Mobility: No    Exercises     PT Diagnosis: Difficulty walking;Generalized weakness  PT Problem List: Decreased strength;Decreased coordination;Decreased activity tolerance;Decreased mobility PT Treatment Interventions: DME instruction;Gait training;Functional mobility training;Therapeutic activities;Patient/family education     PT Goals(Current goals can be found in the care plan section) Acute Rehab PT Goals Patient Stated Goal: able to get back home safe. PT Goal Formulation: With patient Time For Goal Achievement: 07/30/12 Potential to Achieve Goals: Good  Visit Information  Last PT Received On: 07/23/12 Assistance Needed: +1 History of Present Illness: 3 week h/o of on/off sweats and abdominal pain       Prior [place] expects to be discharged to:: Private residence Living Arrangements: Children Available Help at Discharge: Other (Comment) (daughter in law is home, grand children out of school) Type of Home: Mobile home Home Access: Level entry Home Layout: One level Home Equipment: None Prior Function Level of Independence: Independent Communication Communication: No difficulties    Cognition  Cognition Arousal/Alertness: Awake/alert Behavior During Therapy: WFL for tasks assessed/performed Overall Cognitive Status: Within Functional Limits for tasks assessed (but poor historian)    Extremity/Trunk Assessment Upper Extremity Assessment Upper Extremity Assessment: Overall WFL for tasks assessed Lower Extremity Assessment Lower Extremity Assessment: Generalized weakness Cervical / Trunk Assessment Cervical / Trunk Assessment: Normal   Balance Balance Balance Assessed: No  End of Session PT - End of Session Activity Tolerance: Other (comment) (pt had 2 episodes of relative unresponsiveness) Patient left: in chair;with call bell/phone within reach Nurse Communication: Mobility  status  GP     Babs Dabbs, [name] 07/23/2012, 11:32 AM 07/23/2012  [name],  PT [phone]  (pager)

## 2012-07-24 ENCOUNTER — Inpatient Hospital Stay (HOSPITAL_COMMUNITY): Payer: Medicare Other

## 2012-07-24 LAB — COMPREHENSIVE METABOLIC PANEL
Alkaline Phosphatase: 182 U/L — ABNORMAL HIGH (ref 39–117)
BUN: 12 mg/dL (ref 6–23)
Calcium: 8.4 mg/dL (ref 8.4–10.5)
Creatinine, Ser: 1.18 mg/dL — ABNORMAL HIGH (ref 0.50–1.10)
GFR calc Af Amer: 52 mL/min — ABNORMAL LOW (ref >90)
Glucose, Bld: 141 mg/dL — ABNORMAL HIGH (ref 70–99)
Total Protein: 6.2 g/dL (ref 6.0–8.3)

## 2012-07-24 LAB — CBC
HCT: 25.9 % — ABNORMAL LOW (ref 36.0–46.0)
MCH: 27.6 pg (ref 26.0–34.0)
MCHC: 32 g/dL (ref 30.0–36.0)
MCV: 86 fL (ref 78.0–100.0)
RDW: 14.8 % (ref 11.5–15.5)

## 2012-07-24 LAB — GLUCOSE, CAPILLARY: Glucose-Capillary: 189 mg/dL — ABNORMAL HIGH (ref 70–99)

## 2012-07-24 MED ORDER — CIPROFLOXACIN HCL 500 MG PO TABS: 500.0000 mg | ORAL_TABLET | Freq: Two times a day (BID) | ORAL | Status: DC

## 2012-07-24 MED ORDER — AMLODIPINE BESYLATE 10 MG PO TABS
10.0000 mg | ORAL_TABLET | Freq: Every day | ORAL | Status: DC
Start: 2012-07-24 — End: 2012-07-25
  Administered 2012-07-24 – 2012-07-25 (×2): 10 mg via ORAL
  Filled 2012-07-24 (×2): qty 1

## 2012-07-24 MED ORDER — SODIUM POLYSTYRENE SULFONATE 15 GM/60ML PO SUSP
15.0000 g | Freq: Once | ORAL | Status: AC
  Administered 2012-07-24: 15 g via ORAL
  Filled 2012-07-24: qty 60

## 2012-07-24 MED ORDER — ONDANSETRON HCL 4 MG/2ML IJ SOLN
4.0000 mg | Freq: Four times a day (QID) | INTRAMUSCULAR | Status: DC | PRN
  Administered 2012-07-24: 4 mg via INTRAVENOUS
  Filled 2012-07-24: qty 2

## 2012-07-24 MED ORDER — CIPROFLOXACIN HCL 500 MG PO TABS
500.0000 mg | ORAL_TABLET | Freq: Two times a day (BID) | ORAL | Status: DC
  Administered 2012-07-24 – 2012-07-25 (×3): 500 mg via ORAL
  Filled 2012-07-24 (×5): qty 1

## 2012-07-24 MED ORDER — METRONIDAZOLE 500 MG PO TABS: 500.0000 mg | ORAL_TABLET | Freq: Three times a day (TID) | ORAL | Status: DC

## 2012-07-24 NOTE — Progress Notes (Signed)
Educated pt on how to empty JP drain, measure, and record output. Educational material provided which included a graph to record output on. Pt verbalized understanding. Will teach again when pt's family is at bedside.

## 2012-07-24 NOTE — Progress Notes (Signed)
Subjective: Pt had nausea and vomiting last night, she thinks it was shortly after receiving augmentin.  Alleviated with zofran, has not had any further episodes.  She had 2x of diarrhea, last being this morning.  Denies fever, chills or sweats.    Objective: Vital signs in last 24 hours: Temp:  [98.1 F (36.7 C)-99.3 F (37.4 C)] 98.6 F (37 C) (07/02 0555) Pulse Rate:  [67-74] 73 (07/02 0555) Resp:  [17-18] 17 (07/02 0555) BP: (140-158)/(48-57) 140/57 mmHg (07/02 0555) SpO2:  [94 %-96 %] 95 % (07/02 0555) Last BM Date: 07/23/12  Intake/Output from previous day: 07/01 0701 - 07/02 0700 In: 1891.5 [P.O.:1220; I.V.:651.5] Out: 565 [Urine:550; Drains:15] Intake/Output this shift:   Physical Exam:  General: Pt awake/alert/oriented x3 in no acute distress   Chest: CTA in upper andl ower fields with mild wheezing in lateral fields. No chest wall pain w good excursion  CV: RRR, normal S1/S2, no M/G/R, Pulses intact. Regular rhythm  Abdomen: Soft. Nondistended. BS present in all 4 quadrants, Mildly tender in LLQ only. No evidence of peritonitis. No incarcerated hernias. Perc drain LLQ abdomen with brownish purulent output.  Ext: No mjr edema. No cyanosis  Skin: No petechiae / purpura   Lab Results:   Recent Labs  07/23/12 1438 07/24/12 0737  WBC 7.5 6.3  HGB 8.3* 8.3*  HCT 26.1* 25.9*  PLT 336 330   BMET  Recent Labs  07/23/12 1438 07/24/12 0737  NA 138 138  K 4.6 5.2*  CL 108 107  CO2 21 23  GLUCOSE 157* 141*  BUN 17 12  CREATININE 1.20* 1.18*  CALCIUM 8.5 8.4    Studies/Results: Dg Abd Portable 1v  07/24/2012   *RADIOLOGY REPORT*  Clinical Data: Nausea and vomiting.  Appendiceal abscess drainage.  PORTABLE ABDOMEN - 1 VIEW  Comparison: CT earlier last month.  Findings: Pigtail drainage catheters present the left lower quadrant.  Bowel gas pattern is nonobstructive.  There is no free air identified.  Dense calcification is unchanged in the left anatomic pelvis.   IMPRESSION: Pigtail drainage catheter with unchanged position of the left lower quadrant.  Nonobstructive bowel gas pattern.   Original Report Authenticated By: Dereck Ligas, M.D.    Anti-infectives: Anti-infectives   Start     Dose/Rate Route Frequency Ordered Stop   07/23/12 1200  amoxicillin-clavulanate (AUGMENTIN) 500-125 MG per tablet 500 mg     1 tablet Oral 2 times daily 07/23/12 1026 08/06/12 0959   07/20/12 1500  vancomycin (VANCOCIN) IVPB 1000 mg/200 mL premix  Status:  Discontinued     1,000 mg 200 mL/hr over 60 Minutes Intravenous Every 24 hours 07/20/12 1347 07/20/12 1434   07/19/12 1800  vancomycin (VANCOCIN) IVPB 1000 mg/200 mL premix  Status:  Discontinued     1,000 mg 200 mL/hr over 60 Minutes Intravenous Every 24 hours 07/19/12 1752 07/20/12 1347   07/19/12 1545  ertapenem (INVANZ) 1 g in sodium chloride 0.9 % 50 mL IVPB  Status:  Discontinued     1 g 100 mL/hr over 30 Minutes Intravenous Every 24 hours 07/19/12 1537 07/23/12 0948   07/19/12 1430  piperacillin-tazobactam (ZOSYN) IVPB 3.375 g     3.375 g 12.5 mL/hr over 240 Minutes Intravenous  Once 07/19/12 1426 07/19/12 1519      Assessment/Plan: Perforated appendicitis with abscess, aberrant anatomy  -perc drain in place placed by IR on 07/19/12, 40mL over 24 hours of serosanguinous drianage -pain is improving as expected  -WBC is normal  -cultures-e  coli -Invanz (started on 07/19/12). Augmentin started yesterday, but pt likely unable to tolerate.  Start cipro based on this and susceptibility  -continue with regular diet -Ambulate with PT.   -Plan for discharge home with son and daughter tomorrow.  HTN -resume amlodipine -will leave up to internal medicine to reconcile other other home medication given baseline renal insufficiency, ACE, HCTZ  Problems managed by internal medicine  Hypertension  Acute on chronic renal failure  Bradycardia  Diabetes type 2, uncontrolled  Normocytic anemia   LOS: 5  days    Oneida Arenas [facility] ANP-BC Pager [phone]  07/24/2012 8:45 AM

## 2012-07-24 NOTE — Progress Notes (Addendum)
  Subjective: Pelvic abscess drain placed 6/27 Output minimal Feels better  Objective: Vital signs in last 24 hours: Temp:  [98.1 F (36.7 C)-99.3 F (37.4 C)] 98.6 F (37 C) (07/02 0555) Pulse Rate:  [67-73] 73 (07/02 0555) Resp:  [17-18] 17 (07/02 0555) BP: (140-158)/(48-57) 140/57 mmHg (07/02 0555) SpO2:  [94 %-96 %] 95 % (07/02 0555) Last BM Date: 07/23/12  Intake/Output from previous day: 07/01 0701 - 07/02 0700 In: 1891.5 [P.O.:1220; I.V.:651.5] Out: 565 [Urine:550; Drains:15] Intake/Output this shift: Total I/O In: 120 [P.O.:120] Out: 401 [Urine:400; Stool:1]  PE: afeb; vss Wbc wnl Cx + ecoli Output sl bloody/serous Site clean and dry; NT   Lab Results:   Recent Labs  07/23/12 1438 07/24/12 0737  WBC 7.5 6.3  HGB 8.3* 8.3*  HCT 26.1* 25.9*  PLT 336 330   BMET  Recent Labs  07/23/12 1438 07/24/12 0737  NA 138 138  K 4.6 5.2*  CL 108 107  CO2 21 23  GLUCOSE 157* 141*  BUN 17 12  CREATININE 1.20* 1.18*  CALCIUM 8.5 8.4   PT/INR No results found for this basename: LABPROT, INR,  in the last 72 hours ABG No results found for this basename: PHART, PCO2, PO2, HCO3,  in the last 72 hours  Studies/Results: Dg Abd Portable 1v  07/24/2012   *RADIOLOGY REPORT*  Clinical Data: Nausea and vomiting.  Appendiceal abscess drainage.  PORTABLE ABDOMEN - 1 VIEW  Comparison: CT earlier last month.  Findings: Pigtail drainage catheters present the left lower quadrant.  Bowel gas pattern is nonobstructive.  There is no free air identified.  Dense calcification is unchanged in the left anatomic pelvis.  IMPRESSION: Pigtail drainage catheter with unchanged position of the left lower quadrant.  Nonobstructive bowel gas pattern.   Original Report Authenticated By: Dereck Ligas, M.D.    Anti-infectives:   Assessment/Plan: s/p * No surgery found *  Pelvic drain intact Plan per [facility] prob dc today with drain   LOS: 5 days    Addendum: drain will need  flushed 1x/day with 5 cc sterile saline Record output daily Follow with [facility] Need ReCT before pull  Tvisha Schwoerer A 07/24/2012

## 2012-07-24 NOTE — Clinical Documentation Improvement (Signed)
THIS DOCUMENT IS NOT A PERMANENT PART OF THE MEDICAL RECORD  Please update your documentation with the medical record to reflect your response to this query. If you need help knowing how to do this please call [phone].  07/24/12  Dear Dr. Wyline Copas Rolley Sims,  IDear Dr. Joette Catching Rolley Sims,   In a better effort to capture your patient's severity of illness, reflect appropriate length of stay and utilization of resources, a review of the patient medical record has revealed the following: hold all antihypertensives pending septic shock (ED progress note).    Admitted with ruptured appendix with large peri-appendiceal abscess Percutaneous drainage of foul smelling, purulent material by IR WBC = 20.3 Vitals on admission: 106/88 - 50 - 14. Patient with bradycardia since admit   Smoldering infection noted in 6/29 progress note     Based on your clinical judgment, please clarify and document in a progress note and discharge summary if you feel:               Sepsis ruled in             Sepsis ruled out             Pending septic shock: ruled in or ruled out   In responding to this query please exercise your independent judgment.  The fact that a query is asked, does not imply that any particular answer is desired or expected.  No additional documentation  Thank Gaylyn Rong  Clinical Documentation Specialist: [place]

## 2012-07-24 NOTE — Progress Notes (Signed)
[facility]                                                                                Patient Demographics  Laura Best, is a 72 y.o. female, DOB - [birth year], FU:[id number], [place] date - 07/19/2012  Admitting Physician Reyne Dumas, MD  Outpatient Primary MD for the patient is [name], PA-C  LOS - 5   Chief Complaint  Patient presents with  . Abdominal Pain        Assessment & Plan    Abscess, appendix (cecum/appendix in LEFT lower quadrant)  Status post percutaneous drain per IR - ongoing care per General Surgery - diet advanced to dysphagia 3 with thin liquids on 7/1  - Per surgery okay for discharge, she had mild nausea vomiting due to Augmentin which has resolved, now on Cipro, discussed with IR patient okay to go home they will write home recommendations for JP drain monitoring.   Mild hyperkalemia. Give her Kayexalate, repeat potassium levels later in the day.   Hypertension  Blood pressure is currently reasonably controlled    Acute on chronic renal failure  -Creatinine 2.0 at presentation - baseline creatinine 1.21 as of March 2014  -Cr continues to trend towards baseline    Bradycardia  Stable    Diabetes type 2, uncontrolled  CBGs currently well controlled  - A1c 7.2   CBG (last 3)   Recent Labs  07/23/12 1723 07/23/12 2208 07/24/12 0749  GLUCAP 153* 152* 137*     Normocytic anemia  Likely  anemia of chronic disease.- goal will be to keep hemoglobin greater than 7.0 - anemia panel consistent with such     Consultants:  General surgery   Procedures:  6/27 - IR placement of drain in appendiceal abscess    Code Status: Full  Family Communication: Pt in room  Disposition Plan: Poss d/c tomorrow      DVT Prophylaxis  Lovenox    Lab Results  Component Value Date   PLT 330 07/24/2012    Medications  Scheduled Meds: . amLODipine  10 mg Oral Daily  . ciprofloxacin  500 mg Oral BID  .  enoxaparin (LOVENOX) injection  30 mg Subcutaneous Q24H  . insulin aspart  0-15 Units Subcutaneous TID WC  . pneumococcal 23 valent vaccine  0.5 mL Intramuscular Tomorrow-1000  . sodium chloride  3 mL Intravenous Q12H  . sodium polystyrene  15 g Oral Once   Continuous Infusions: . sodium chloride 20 mL/hr at 07/23/12 1003   PRN Meds:.HYDROcodone-acetaminophen, HYDROmorphone (DILAUDID) injection, ondansetron, zolpidem  Antibiotics    Anti-infectives   Start     Dose/Rate Route Frequency Ordered Stop   07/24/12 0915  metroNIDAZOLE (FLAGYL) tablet 500 mg  Status:  Discontinued     500 mg Oral 3 times per day 07/24/12 0910 07/24/12 0911   07/24/12 0845  ciprofloxacin (CIPRO) tablet 500 mg     500 mg Oral 2 times daily 07/24/12 0842     07/23/12 1200  amoxicillin-clavulanate (AUGMENTIN) 500-125 MG per tablet 500 mg  Status:  Discontinued     1 tablet Oral 2 times daily 07/23/12 1026 07/24/12 0842   07/20/12 1500  vancomycin (VANCOCIN) IVPB 1000 mg/200 mL premix  Status:  Discontinued     1,000 mg 200 mL/hr over 60 Minutes Intravenous Every 24 hours 07/20/12 1347 07/20/12 1434   07/19/12 1800  vancomycin (VANCOCIN) IVPB 1000 mg/200 mL premix  Status:  Discontinued     1,000 mg 200 mL/hr over 60 Minutes Intravenous Every 24 hours 07/19/12 1752 07/20/12 1347   07/19/12 1545  ertapenem (INVANZ) 1 g in sodium chloride 0.9 % 50 mL IVPB  Status:  Discontinued     1 g 100 mL/hr over 30 Minutes Intravenous Every 24 hours 07/19/12 1537 07/23/12 0948   07/19/12 1430  piperacillin-tazobactam (ZOSYN) IVPB 3.375 g     3.375 g 12.5 mL/hr over 240 Minutes Intravenous  Once 07/19/12 1426 07/19/12 1519       Time Spent in minutes  35   Talley Kreiser K M.D on 07/24/2012 at 11:10 AM  Between 7am to 7pm - Pager - [phone]  After 7pm go to [email or web address] - password [email or web address]  And look for the night coverage person covering for me after hours  [facility] Office  [phone]     Subjective:   Laura Best today has, No headache, No chest pain, No abdominal pain - No Nausea, No new weakness tingling or numbness, No Cough - SOB. Nausea vomiting last night which has now resolved.  Objective:   Filed Vitals:   07/23/12 1241 07/23/12 1242 07/23/12 2136 07/24/12 0555  BP: 156/57 158/57 140/49 140/57  Pulse:   70 73  Temp:  98.4 F (36.9 C) 99.3 F (37.4 C) 98.6 F (37 C)  TempSrc:   Oral Oral  Resp:   18 17  Height:      Weight:      SpO2: 94% 96% 95% 95%    Wt Readings from Last 3 Encounters:  07/21/12 68 kg (149 lb 14.6 oz)  07/16/12 63.504 kg (140 lb)  07/01/12 64.297 kg (141 lb 12 oz)     Intake/Output Summary (Last 24 hours) at 07/24/12 1110 Last data filed at 07/24/12 0944  Gross per 24 hour  Intake 1771.5 ml  Output    706 ml  Net 1065.5 ml    Exam Awake Alert, Oriented X 3, No new F.N deficits, Normal affect [place].AT,PERRAL Supple Neck,No JVD, No cervical lymphadenopathy appriciated.  Symmetrical Chest wall movement, Good air movement bilaterally, CTAB RRR,No Gallops,Rubs or new Murmurs, No Parasternal Heave +ve B.Sounds, Abd Soft, Non tender, No organomegaly appriciated, No rebound - guarding or rigidity.JP drain stable. No Cyanosis, Clubbing or edema, No new Rash or bruise      Data Review   Micro Results Recent Results (from the past 240 hour(s))  CULTURE, ROUTINE-ABSCESS     Status: None   Collection Time    07/19/12  5:33 PM      Result Value Range Status   Specimen Description ABSCESS FLUID   Final   Special Requests PERITONEAL CAVITY   Final   Gram Stain     Final   Value: ABUNDANT WBC PRESENT,BOTH PMN AND MONONUCLEAR     NO SQUAMOUS EPITHELIAL CELLS SEEN     MODERATE GRAM NEGATIVE RODS   Culture ABUNDANT ESCHERICHIA COLI   Final   Report Status 07/22/2012 FINAL   Final   Organism ID, Bacteria ESCHERICHIA COLI   Final  ANAEROBIC CULTURE     Status: None   Collection Time    07/19/12  5:33 PM      Result Value Range  Status   Specimen Description ABSCESS FLUID   Final   Special Requests  PERITONEAL CAVITY   Final   Gram Stain     Final   Value: ABUNDANT WBC PRESENT,BOTH PMN AND MONONUCLEAR     NO SQUAMOUS EPITHELIAL CELLS SEEN     MODERATE GRAM NEGATIVE RODS   Culture     Final   Value: NO ANAEROBES ISOLATED; CULTURE IN PROGRESS FOR 5 DAYS   Report Status PENDING   Incomplete  CULTURE, BLOOD (ROUTINE X 2)     Status: None   Collection Time    07/19/12  6:20 PM      Result Value Range Status   Specimen Description BLOOD ARM RIGHT   Final   Special Requests BOTTLES DRAWN AEROBIC AND ANAEROBIC 10CC   Final   Culture  Setup Time 07/20/2012 01:38   Final   Culture     Final   Value:        BLOOD CULTURE RECEIVED NO GROWTH TO DATE CULTURE WILL BE HELD FOR 5 DAYS BEFORE ISSUING A FINAL NEGATIVE REPORT   Report Status PENDING   Incomplete  CULTURE, BLOOD (ROUTINE X 2)     Status: None   Collection Time    07/19/12  6:30 PM      Result Value Range Status   Specimen Description BLOOD HAND RIGHT   Final   Special Requests BOTTLES DRAWN AEROBIC AND ANAEROBIC 10CC   Final   Culture  Setup Time 07/20/2012 01:37   Final   Culture     Final   Value:        BLOOD CULTURE RECEIVED NO GROWTH TO DATE CULTURE WILL BE HELD FOR 5 DAYS BEFORE ISSUING A FINAL NEGATIVE REPORT   Report Status PENDING   Incomplete    Radiology Reports Ct Abdomen Pelvis Wo Contrast  07/19/2012   *RADIOLOGY REPORT*  Clinical Data: Abdominal pain and fever.  CT ABDOMEN AND PELVIS WITHOUT CONTRAST  Technique:  Multidetector CT imaging of the abdomen and pelvis was performed following the standard protocol without intravenous contrast.  Comparison: Radiographs dated 07/19/2012  Findings: There is a 10 x 8.5 x 6.5 cm periappendiceal abscess in the left lower quadrant due to perforated appendicitis.  There are three fecaliths seen within the irregular abscess.  The cecum lies in the left mid abdomen.  There is thickening of the mucosa of the  adjacent terminal ileum.  The liver, biliary tree, spleen, pancreas, adrenal glands, and kidneys are normal.  There is no free intraperitoneal air and only a tiny amount of free fluid in the pelvic cul-de-sac.  The uterus and ovaries are normal.  No acute osseous abnormality.  IMPRESSION: Perforated appendicitis with a large periappendiceal abscess.  The appendix and abscess are in the LEFT lower quadrant.   Original Report Authenticated By: Lorriane Shire, M.D.   Ct Guided Abscess Drain  07/19/2012   *RADIOLOGY REPORT*  C T ABSCESS DRAINAGE PLACEMENT  Date: 07/19/2012  Clinical History: 72 year old female with perforated appendicitis and left lower quadrant abscess.  Procedures Performed: 1. CT guided placement of a 14-French drain  Interventional Radiologist:  Criselda Peaches, MD  Sedation: Moderate (conscious) sedation was used.  To mg Versed, 25 mcg Fentanyl were administered intravenously.  The patient's vital signs were monitored continuously by radiology nursing throughout the procedure.  Sedation Time: 10 minutes  Fluoroscopy time: 2 seconds  Contrast volume: None  PROCEDURE/FINDINGS:   Informed consent was obtained from the patient following explanation  of the procedure, risks, benefits and alternatives. The patient understands, agrees and consents for the procedure. All questions were addressed. A time out was performed.  Maximal barrier sterile technique utilized including caps, mask, sterile gowns, sterile gloves, large sterile drape, hand hygiene, and betadine skin prep.  A planning axial CT scan was performed.  The left lower quadrant abscess collection was identified.  A suitable skin entry site was selected and marked.  Local anesthesia was achieved by infiltration of 1% lidocaine.  Under CT fluoroscopic guidance, an 18 gauge trocar needle was advanced over the abscess.  A 0.0350 guidewire was then coiled within the abscess collection.  The tract was serially dilated to 14-French and a [place] Axon 82  [place] all-purpose drainage catheter was advanced into the abscess.  Approximately 200 ml of foul-smelling purulent fluid was then aspirated.  The drain was secured in place with an O Prolene suture and connected to a suction bulb drainage.  A final axial CT scan demonstrated excellent placement of the drain and significant decrease in the size of the abscess collection.  The patient tolerated the procedure well, there was no immediate complication.  IMPRESSION:  Technically successful placement of a 14-French drainage catheter into the left lower quadrant appendiceal abscess.  Signed,  Criselda Peaches, MD Vascular & Interventional Radiologist [facility]   Original Report Authenticated By: Jacqulynn Cadet, M.D.   Dg Abd Acute W/chest  07/19/2012   *RADIOLOGY REPORT*  Clinical Data: Abdominal pain.  ACUTE ABDOMEN SERIES (ABDOMEN 2 VIEW & CHEST 1 VIEW)  Comparison: 07/16/2012 and chest x-ray dated 09/08/2011   Findings: Chronic mild cardiomegaly with chronic pulmonary vascular congestion.  Lungs are hyperinflated consistent with emphysema.  No free air in the abdomen.  There is fairly extensive stool in the nondistended colon.  There is no fecal impaction.  There is fairly extensive air in the nondistended small bowel.  No acute osseous abnormality. Splenic artery calcifications in the left upper quadrant.  IMPRESSION: No acute abnormality of the abdomen or chest.   Original Report Authenticated By: Lorriane Shire, M.D.   Dg Abd Acute W/chest  07/17/2012   *RADIOLOGY REPORT*  Clinical Data: Left lower quadrant pain.  Nausea, fever.  ACUTE ABDOMEN SERIES (ABDOMEN 2 VIEW & CHEST 1 VIEW)  Comparison: Chest x-ray 09/08/2011  Findings: There is hyperinflation of the lungs compatible with COPD.  Prior median sternotomy and CABG.  Heart is borderline in size.  No confluent opacities or effusions.  Mild gaseous distention of the transverse colon.  No small bowel distention to suggest obstruction.  No free  air.  No organomegaly or suspicious calcification.  No acute bony abnormality.  IMPRESSION: Suspect mild ileus.  COPD, cardiomegaly.   Original Report Authenticated By: Rolm Baptise, M.D.   Dg Abd Portable 1v  07/24/2012   *RADIOLOGY REPORT*  Clinical Data: Nausea and vomiting.  Appendiceal abscess drainage.  PORTABLE ABDOMEN - 1 VIEW  Comparison: CT earlier last month.  Findings: Pigtail drainage catheters present the left lower quadrant.  Bowel gas pattern is nonobstructive.  There is no free air identified.  Dense calcification is unchanged in the left anatomic pelvis.  IMPRESSION: Pigtail drainage catheter with unchanged position of the left lower quadrant.  Nonobstructive bowel gas pattern.   Original Report Authenticated By: Dereck Ligas, M.D.    [facility] 07/19/12 1005 07/19/12 1134  07/20/12 0405 07/21/12 0650 07/22/12 0620 07/23/12 1438 07/24/12 0737  WBC 19.5* 20.2*  < > 14.3* 8.6 7.7 7.5  6.3  HGB 9.3* 8.4*  < > 7.4* 7.5* 7.3* 8.3* 8.3*  HCT 28.5* 26.0*  < > 22.9* 23.5* 23.1* 26.1* 25.9*  PLT 387 412*  < > 312 279 288 336 330  MCV 86.4 85.0  < > 86.1 87.4 85.6 85.9 86.0  MCH 28.2 27.5  < > 27.8 27.9 27.0 27.3 27.6  MCHC 32.6 32.3  < > 32.3 31.9 31.6 31.8 32.0  RDW 14.8 15.0  < > 15.3 15.2 14.7 14.7 14.8  LYMPHSABS 1.1 1.2  --   --   --   --   --   --   MONOABS  --  1.8*  --   --   --   --   --   --   EOSABS  --  0.1  --   --   --   --   --   --   BASOSABS  --  0.0  --   --   --   --   --   --   < > = values in this interval not displayed.  Chemistries   Recent Labs Lab 07/19/12 1134  07/20/12 0405 07/21/12 0650 07/22/12 0620 07/23/12 1438 07/24/12 0737  NA 134*  --  133* 134* 134* 138 138  K 4.8  --  5.0 5.1 5.1 4.6 5.2*  CL 100  --  103 106 105 108 107  CO2 21  --  23 21 20 21 23   GLUCOSE 134*  --  89 136* 130* 157* 141*  BUN 46*  --  46* 38* 27* 17 12  CREATININE 1.98*  < > 2.17* 1.82* 1.50* 1.20* 1.18*  CALCIUM 8.7  --  8.0* 8.1* 7.9* 8.5 8.4   AST 19  --  17  --   --   --  14  ALT 32  --  22  --   --   --  14  ALKPHOS 370*  --  277*  --   --   --  182*  BILITOT 0.3  --  0.2*  --   --   --  0.2*  < > = values in this interval not displayed. ------------------------------------------------------------------------------------------------------------------ estimated creatinine clearance is 40.5 ml/min (by C-G formula based on Cr of 1.18). ------------------------------------------------------------------------------------------------------------------ No results found for this basename: HGBA1C,  in the last 72 hours ------------------------------------------------------------------------------------------------------------------ No results found for this basename: CHOL, HDL, LDLCALC, TRIG, CHOLHDL, LDLDIRECT,  in the last 72 hours ------------------------------------------------------------------------------------------------------------------ No results found for this basename: TSH, T4TOTAL, FREET3, T3FREE, THYROIDAB,  in the last 72 hours ------------------------------------------------------------------------------------------------------------------ No results found for this basename: VITAMINB12, FOLATE, FERRITIN, TIBC, IRON, RETICCTPCT,  in the last 72 hours  Coagulation profile  Recent Labs Lab 07/20/12 0405  INR 1.16    No results found for this basename: DDIMER,  in the last 72 hours  Cardiac Enzymes  Recent Labs Lab 07/19/12 1825 07/19/12 2331 07/20/12 0405  TROPONINI <0.30 <0.30 <0.30   ------------------------------------------------------------------------------------------------------------------ No components found with this basename: POCBNP,

## 2012-07-24 NOTE — Care Management Note (Signed)
  Page 2 of 2   07/24/2012     10:02:47 AM   CARE MANAGEMENT NOTE 07/24/2012  Patient:  Laura Best, Laura Best   Account Number:  [id number]  Date Initiated:  07/22/2012  Documentation initiated by:  MAYO,HENRIETTA  Subjective/Objective Assessment:   72 yr-old female adm with dx of LLQ abscess; lives with family     Action/Plan:   Anticipated DC Date:  07/25/2012   Anticipated DC Plan:  [name]  CM consult      Choice offered to / List presented to:  C-1 Patient   DME arranged  3-N-1      DME agency  [name] arranged  [name]      [name].   Status of service:  Completed, signed off Medicare Important Message given?   (If response is "NO", the following Medicare IM given date fields will be blank) Date Medicare IM given:   Date Additional Medicare IM given:    Discharge Disposition:    Per UR Regulation:  Reviewed for med. necessity/level of care/duration of stay  If discussed at [place] of Stay Meetings, dates discussed:    Comments:  PCP:  Laura Best   07-24-12 Facesheet information confirmed with patient . She lives with her son, daughter - in - law and 2 grand children . Patient's son is Laura Best [phone].  [name] RN BSN [phone]

## 2012-07-24 NOTE — Progress Notes (Addendum)
Patient ID: Laura Best, female   DOB: 23-Aug-1940, 72 y.o.   MRN: [id number]  The patient did well with breakfast, no further nausea or vomiting.  She tolerated the cipro well.  Stable for discharge from surgical standpoint.  Discussed with Dr. Candiss Norse.  She will need drain care teaching prior to discharge which will include flushing, emptying, recording and warning signs to seek further assistance.  Order home health for assistance at home.  Aysel Gilchrest, ANP-BC

## 2012-07-24 NOTE — Progress Notes (Signed)
N/V with Augmentin.  Switch to Cipro Possibly ready for discharge this afternoon if she tolerates Cipro.  [name]. [name], MD, [facility] Surgery  General/ Trauma Surgery  07/24/2012 9:24 AM

## 2012-07-24 NOTE — Progress Notes (Signed)
Physical Therapy Treatment Patient Details Name: Laura Best MRN: [id number] DOB: February 27, 1940 Today's Date: 07/24/2012 Time: [id number] PT Time Calculation (min): 22 min  PT Assessment / Plan / Recommendation  PT Comments   Ready for D/C; can't get a good handle on pt's help at home given she and dtr in law don't always see eye to eye.   Follow Up Recommendations  Home health PT;Supervision for mobility/OOB     Does the patient have the potential to tolerate intense rehabilitation     Barriers to Discharge        Equipment Recommendations  3in1 (PT)    Recommendations for Other Services    Frequency Min 3X/week   Progress towards PT Goals    Plan Current plan remains appropriate    Precautions / Restrictions Precautions Precautions: Fall Restrictions Weight Bearing Restrictions: No   Pertinent Vitals/Pain     Mobility  Bed Mobility Bed Mobility: Supine to Sit;Sitting - Scoot to [employer] of Bed;Sit to Supine Supine to Sit: 4: Min assist Sitting - Scoot to Edge of Bed: 5: Supervision Sit to Supine: 5: Supervision Details for Bed Mobility Assistance: vc's for best technique supine to sit Transfers Transfers: Sit to Stand;Stand to Sit Sit to Stand: 4: Min guard;With upper extremity assist;From bed;From chair/3-in-1 Stand to Sit: 4: Min guard;With upper extremity assist;To bed;To chair/3-in-1 Details for Transfer Assistance: generally safe mobility, vc's for hand placement Ambulation/Gait Ambulation/Gait Assistance: 4: Min assist Ambulation Distance (Feet): 15 Feet (then 100 feet pushing iv pole) Assistive device: Other (Comment) (iv pole) Ambulation/Gait Assistance Details: short tentative steps Gait Pattern: Step-through pattern Stairs: No Wheelchair Mobility Wheelchair Mobility: No    Exercises     PT Diagnosis:    PT Problem List:   PT Treatment Interventions:     PT Goals (current goals can now be found in the care plan section) Acute Rehab PT Goals Time For  Goal Achievement: 07/30/12 Potential to Achieve Goals: Good  Visit Information  Last PT Received On: 07/24/12 Assistance Needed: +1 History of Present Illness: 3 week h/o of on/off sweats and abdominal pain    Subjective Data  Subjective: I'm  not good, they've   Cognition  Cognition Arousal/Alertness: Awake/alert Behavior During Therapy: WFL for tasks assessed/performed Overall Cognitive Status: Within Functional Limits for tasks assessed    Balance  Balance Balance Assessed: No  End of Session PT - End of Session Activity Tolerance: Patient tolerated treatment well Patient left: in bed;with call bell/phone within reach Nurse Communication: Mobility status   GP     [name], [name] 07/24/2012, 2:53 PM  07/24/2012  [name], PT [phone]  (pager)

## 2012-07-25 LAB — GLUCOSE, CAPILLARY
Glucose-Capillary: 153 mg/dL — ABNORMAL HIGH (ref 70–99)
Glucose-Capillary: 121 mg/dL — ABNORMAL HIGH (ref 70–99)

## 2012-07-25 LAB — ANAEROBIC CULTURE

## 2012-07-25 MED ORDER — CIPROFLOXACIN HCL 500 MG PO TABS: 500.0000 mg | ORAL_TABLET | Freq: Two times a day (BID) | ORAL | Status: AC

## 2012-07-25 NOTE — Progress Notes (Signed)
Subjective: Pt doing great today.  tolerating diet well.  She has done great with the cipro thus far.  She is ambulating with standby assistance.  Her potassium is down to normal today.  She denies fever, chills or sweats.  She is ready to go home with son and daughter in law.    Objective: Vital signs in last 24 hours: Temp:  [98.4 F (36.9 C)-98.8 F (37.1 C)] 98.8 F (37.1 C) (07/03 0651) Pulse Rate:  [69-77] 77 (07/03 0651) Resp:  [16] 16 (07/03 0651) BP: (148-156)/(52-60) 154/60 mmHg (07/03 0651) SpO2:  [93 %-96 %] 93 % (07/03 0651) Last BM Date: 07/24/12  Intake/Output from previous day: 07/02 0701 - 07/03 0700 In: 240 [P.O.:240] Out: 823 [Urine:800; Drains:20; Stool:3] Intake/Output this shift:   Physical Exam:  General: Pt awake/alert/oriented x3 in no acute distress  Chest: CTA in upper andl ower fields with mild wheezing in lateral fields. No chest wall pain w good excursion  CV: RRR, normal S1/S2, no M/G/R, Pulses intact. Regular rhythm  Abdomen: Soft. Nondistended. BS present in all 4 quadrants, No tenderness. No evidence of peritonitis. No incarcerated hernias. Perc drain LLQ abdomen with brownish purulent output.  Ext: No mjr edema. No cyanosis  Skin: No petechiae / purpura   Lab Results:   Recent Labs  07/23/12 1438 07/24/12 0737  WBC 7.5 6.3  HGB 8.3* 8.3*  HCT 26.1* 25.9*  PLT 336 330   BMET  Recent Labs  07/23/12 1438 07/24/12 0737 07/24/12 1632 07/25/12 0021  NA 138 138  --   --   K 4.6 5.2* 5.1 4.5  CL 108 107  --   --   CO2 21 23  --   --   GLUCOSE 157* 141*  --   --   BUN 17 12  --   --   CREATININE 1.20* 1.18*  --   --   CALCIUM 8.5 8.4  --   --    PT/INR No results found for this basename: LABPROT, INR,  in the last 72 hours ABG No results found for this basename: PHART, PCO2, PO2, HCO3,  in the last 72 hours  Studies/Results: Dg Abd Portable 1v  07/24/2012   *RADIOLOGY REPORT*  Clinical Data: Nausea and vomiting.   Appendiceal abscess drainage.  PORTABLE ABDOMEN - 1 VIEW  Comparison: CT earlier last month.  Findings: Pigtail drainage catheters present the left lower quadrant.  Bowel gas pattern is nonobstructive.  There is no free air identified.  Dense calcification is unchanged in the left anatomic pelvis.  IMPRESSION: Pigtail drainage catheter with unchanged position of the left lower quadrant.  Nonobstructive bowel gas pattern.   Original Report Authenticated By: Dereck Ligas, M.D.    Anti-infectives: Anti-infectives   Start     Dose/Rate Route Frequency Ordered Stop   07/24/12 0915  metroNIDAZOLE (FLAGYL) tablet 500 mg  Status:  Discontinued     500 mg Oral 3 times per day 07/24/12 0910 07/24/12 0911   07/24/12 0845  ciprofloxacin (CIPRO) tablet 500 mg     500 mg Oral 2 times daily 07/24/12 0842     07/24/12 0000  ciprofloxacin (CIPRO) 500 MG tablet     500 mg Oral 2 times daily 07/24/12 1120 08/07/12 2359   07/23/12 1200  amoxicillin-clavulanate (AUGMENTIN) 500-125 MG per tablet 500 mg  Status:  Discontinued     1 tablet Oral 2 times daily 07/23/12 1026 07/24/12 0842   07/20/12 1500  vancomycin (VANCOCIN) IVPB 1000  mg/200 mL premix  Status:  Discontinued     1,000 mg 200 mL/hr over 60 Minutes Intravenous Every 24 hours 07/20/12 1347 07/20/12 1434   07/19/12 1800  vancomycin (VANCOCIN) IVPB 1000 mg/200 mL premix  Status:  Discontinued     1,000 mg 200 mL/hr over 60 Minutes Intravenous Every 24 hours 07/19/12 1752 07/20/12 1347   07/19/12 1545  ertapenem (INVANZ) 1 g in sodium chloride 0.9 % 50 mL IVPB  Status:  Discontinued     1 g 100 mL/hr over 30 Minutes Intravenous Every 24 hours 07/19/12 1537 07/23/12 0948   07/19/12 1430  piperacillin-tazobactam (ZOSYN) IVPB 3.375 g     3.375 g 12.5 mL/hr over 240 Minutes Intravenous  Once 07/19/12 1426 07/19/12 1519      Assessment/Plan: Perforated appendicitis with abscess, aberrant anatomy  -perc drain in place placed by IR on 07/19/12, 71mL over  24 hours of serosanguinous drianage  -pain is improving as expected  -WBC is normal  -cultures-e coli  -Invanz (started on 07/19/12).  Unaable to tolerate augmentin.  Started cipro 7/2. -stable for discharge from surgical standpoint.  Follow up appt scheduled and patient is aware.    LOS: 6 days    Oneida Arenas [facility] ANP-BC Pager [phone]  07/25/2012 8:29 AM

## 2012-07-25 NOTE — Discharge Summary (Signed)
[facility]                                                                                   Laura Best, is a 72 y.o. female  DOB 1940-08-03  MRN [id number].  Admission date:  07/19/2012  Discharge Date:  07/25/2012  Primary MD  [name], PA-C  Admitting Physician  Reyne Dumas, MD  Admission Diagnosis  Intra-abdominal abscess [567.22]  Discharge Diagnosis     Principal Problem:   Abscess, appendix Active Problems:   Hypertension   Diabetes type 2, uncontrolled    Past Medical History  Diagnosis Date  . Diabetes mellitus   . Hypertension   . Hyperlipidemia   . Tricuspid regurgitation     Echo 05/02/06-Nml LV. Mild MR. Mod TR  . Osteoporosis   . Diastolic heart failure     Past Surgical History  Procedure Laterality Date  . Heart chamber revision      patched hole --1993  . Tubal ligation       Recommendations for primary care physician for things to follow:       Discharge Diagnoses:   Principal Problem:   Abscess, appendix Active Problems:   Hypertension   Diabetes type 2, uncontrolled    Discharge Condition: stable   Diet recommendation: See Discharge Instructions below   Consults Gen. surgery, IR    History of present illness and  Hospital Course:     Kindly see H&P for history of present illness and admission details, please review complete Labs, Consult reports and Test reports for all details in brief Laura Best, is a 72 y.o. female, patient with history of diabetes mellitus type 2, chronic diastolic CHF stable this admission, hypertension was admitted to the hospital for cecal/appendicular abscess was in abdominal pain, she was seen by general surgery and by interventional radiology, underwent JP drain placement by interventional radiology and based on the culture sensitivity results placed on appropriate antibiotics all with good effect. She is now symptom-free. General surgery has signed off on the case with outpatient  followup with below mentioned general surgeon, she will continue one more week of oral antibiotic as mentioned below which will be Cipro, patient will need a repeat CT scan by general surgery and based on the results of that CT scan JP drain will be removed by interventional radiology.  During admission she had developed acute renal failure secondary to infection and dehydration this has resolved and her renal function is back to baseline with hydration.    She also has history of hypertension, type 2 diabetes mellitus for which she will resume her home medications unchanged. She will follow with her primary care physician within a week to get a routine set of bloodwork in for routine post hospitalization followup visit     Today   Subjective:   Laura Best today has no headache,no chest abdominal pain,no new weakness tingling or numbness, feels much better wants to go home today.    Objective:   Blood pressure 154/60, pulse 77, temperature 98.8 F (37.1 C), temperature source Oral, resp. rate 16, height 5\' 3"  (1.6 m), weight 68 kg (149 lb 14.6 oz),  SpO2 93.00%.   Intake/Output Summary (Last 24 hours) at 07/25/12 1057 Last data filed at 07/25/12 0842  Gross per 24 hour  Intake    120 ml  Output    772 ml  Net   -652 ml    Exam Awake Alert, Oriented *3, No new F.N deficits, Normal affect [place].AT,PERRAL Supple Neck,No JVD, No cervical lymphadenopathy appriciated.  Symmetrical Chest wall movement, Good air movement bilaterally, CTAB RRR,No Gallops,Rubs or new Murmurs, No Parasternal Heave +ve B.Sounds, Abd Soft, Non tender, No organomegaly appriciated, No rebound -guarding or rigidity. JP drain in place. No Cyanosis, Clubbing or edema, No new Rash or bruise  Data Review   Major procedures and Radiology Reports - PLEASE review detailed and final reports for all details in brief -       Ct Abdomen Pelvis Wo Contrast  07/19/2012   *RADIOLOGY REPORT*  Clinical Data: Abdominal  pain and fever.  CT ABDOMEN AND PELVIS WITHOUT CONTRAST  Technique:  Multidetector CT imaging of the abdomen and pelvis was performed following the standard protocol without intravenous contrast.  Comparison: Radiographs dated 07/19/2012  Findings: There is a 10 x 8.5 x 6.5 cm periappendiceal abscess in the left lower quadrant due to perforated appendicitis.  There are three fecaliths seen within the irregular abscess.  The cecum lies in the left mid abdomen.  There is thickening of the mucosa of the adjacent terminal ileum.  The liver, biliary tree, spleen, pancreas, adrenal glands, and kidneys are normal.  There is no free intraperitoneal air and only a tiny amount of free fluid in the pelvic cul-de-sac.  The uterus and ovaries are normal.  No acute osseous abnormality.  IMPRESSION: Perforated appendicitis with a large periappendiceal abscess.  The appendix and abscess are in the LEFT lower quadrant.   Original Report Authenticated By: Lorriane Shire, M.D.   Ct Guided Abscess Drain  07/19/2012   *RADIOLOGY REPORT*  C T ABSCESS DRAINAGE PLACEMENT  Date: 07/19/2012  Clinical History: 72 year old female with perforated appendicitis and left lower quadrant abscess.  Procedures Performed: 1. CT guided placement of a 14-French drain  Interventional Radiologist:  Criselda Peaches, MD  Sedation: Moderate (conscious) sedation was used.  To mg Versed, 25 mcg Fentanyl were administered intravenously.  The patient's vital signs were monitored continuously by radiology nursing throughout the procedure.  Sedation Time: 10 minutes  Fluoroscopy time: 2 seconds  Contrast volume: None  PROCEDURE/FINDINGS:   Informed consent was obtained from the patient following explanation of the procedure, risks, benefits and alternatives. The patient understands, agrees and consents for the procedure. All questions were addressed. A time out was performed.  Maximal barrier sterile technique utilized including caps, mask, sterile gowns,  sterile gloves, large sterile drape, hand hygiene, and betadine skin prep.  A planning axial CT scan was performed.  The left lower quadrant abscess collection was identified.  A suitable skin entry site was selected and marked.  Local anesthesia was achieved by infiltration of 1% lidocaine.  Under CT fluoroscopic guidance, an 18 gauge trocar needle was advanced over the abscess.  A 0.0350 guidewire was then coiled within the abscess collection.  The tract was serially dilated to 14-French and a Lacinda Axon 75 [place] all-purpose drainage catheter was advanced into the abscess.  Approximately 200 ml of foul-smelling purulent fluid was then aspirated.  The drain was secured in place with an O Prolene suture and connected to a suction bulb drainage.  A final axial CT scan demonstrated excellent placement of  the drain and significant decrease in the size of the abscess collection.  The patient tolerated the procedure well, there was no immediate complication.  IMPRESSION:  Technically successful placement of a 14-French drainage catheter into the left lower quadrant appendiceal abscess.  Signed,  Criselda Peaches, MD Vascular & Interventional Radiologist [facility] Radiology   Original Report Authenticated By: Jacqulynn Cadet, M.D.   Dg Abd Acute W/chest  07/19/2012   *RADIOLOGY REPORT*  Clinical Data: Abdominal pain.  ACUTE ABDOMEN SERIES (ABDOMEN 2 VIEW & CHEST 1 VIEW)  Comparison: 07/16/2012 and chest x-ray dated 09/08/2011   Findings: Chronic mild cardiomegaly with chronic pulmonary vascular congestion.  Lungs are hyperinflated consistent with emphysema.  No free air in the abdomen.  There is fairly extensive stool in the nondistended colon.  There is no fecal impaction.  There is fairly extensive air in the nondistended small bowel.  No acute osseous abnormality. Splenic artery calcifications in the left upper quadrant.  IMPRESSION: No acute abnormality of the abdomen or chest.   Original Report Authenticated By:  Lorriane Shire, M.D.   Dg Abd Acute W/chest  07/17/2012   *RADIOLOGY REPORT*  Clinical Data: Left lower quadrant pain.  Nausea, fever.  ACUTE ABDOMEN SERIES (ABDOMEN 2 VIEW & CHEST 1 VIEW)  Comparison: Chest x-ray 09/08/2011  Findings: There is hyperinflation of the lungs compatible with COPD.  Prior median sternotomy and CABG.  Heart is borderline in size.  No confluent opacities or effusions.  Mild gaseous distention of the transverse colon.  No small bowel distention to suggest obstruction.  No free air.  No organomegaly or suspicious calcification.  No acute bony abnormality.  IMPRESSION: Suspect mild ileus.  COPD, cardiomegaly.   Original Report Authenticated By: Rolm Baptise, M.D.   Dg Abd Portable 1v  07/24/2012   *RADIOLOGY REPORT*  Clinical Data: Nausea and vomiting.  Appendiceal abscess drainage.  PORTABLE ABDOMEN - 1 VIEW  Comparison: CT earlier last month.  Findings: Pigtail drainage catheters present the left lower quadrant.  Bowel gas pattern is nonobstructive.  There is no free air identified.  Dense calcification is unchanged in the left anatomic pelvis.  IMPRESSION: Pigtail drainage catheter with unchanged position of the left lower quadrant.  Nonobstructive bowel gas pattern.   Original Report Authenticated By: Dereck Ligas, M.D.    Micro Results      Recent Results (from the past 240 hour(s))  CULTURE, ROUTINE-ABSCESS     Status: None   Collection Time    07/19/12  5:33 PM      Result Value Range Status   Specimen Description ABSCESS FLUID   Final   Special Requests PERITONEAL CAVITY   Final   Gram Stain     Final   Value: ABUNDANT WBC PRESENT,BOTH PMN AND MONONUCLEAR     NO SQUAMOUS EPITHELIAL CELLS SEEN     MODERATE GRAM NEGATIVE RODS   Culture ABUNDANT ESCHERICHIA COLI   Final   Report Status 07/22/2012 FINAL   Final   Organism ID, Bacteria ESCHERICHIA COLI   Final  ANAEROBIC CULTURE     Status: None   Collection Time    07/19/12  5:33 PM      Result Value Range  Status   Specimen Description ABSCESS FLUID   Final   Special Requests  PERITONEAL CAVITY   Final   Gram Stain     Final   Value: ABUNDANT WBC PRESENT,BOTH PMN AND MONONUCLEAR     NO SQUAMOUS EPITHELIAL CELLS SEEN  MODERATE GRAM NEGATIVE RODS   Culture     Final   Value: NO ANAEROBES ISOLATED; CULTURE IN PROGRESS FOR 5 DAYS   Report Status PENDING   Incomplete  CULTURE, BLOOD (ROUTINE X 2)     Status: None   Collection Time    07/19/12  6:20 PM      Result Value Range Status   Specimen Description BLOOD ARM RIGHT   Final   Special Requests BOTTLES DRAWN AEROBIC AND ANAEROBIC 10CC   Final   Culture  Setup Time 07/20/2012 01:38   Final   Culture     Final   Value:        BLOOD CULTURE RECEIVED NO GROWTH TO DATE CULTURE WILL BE HELD FOR 5 DAYS BEFORE ISSUING A FINAL NEGATIVE REPORT   Report Status PENDING   Incomplete  CULTURE, BLOOD (ROUTINE X 2)     Status: None   Collection Time    07/19/12  6:30 PM      Result Value Range Status   Specimen Description BLOOD HAND RIGHT   Final   Special Requests BOTTLES DRAWN AEROBIC AND ANAEROBIC 10CC   Final   Culture  Setup Time 07/20/2012 01:37   Final   Culture     Final   Value:        BLOOD CULTURE RECEIVED NO GROWTH TO DATE CULTURE WILL BE HELD FOR 5 DAYS BEFORE ISSUING A FINAL NEGATIVE REPORT   Report Status PENDING   Incomplete     CBC w Diff: Lab Results  Component Value Date   WBC 6.3 07/24/2012   HGB 8.3* 07/24/2012   HCT 25.9* 07/24/2012   PLT 330 07/24/2012   LYMPHOPCT 6* 07/19/2012   MONOPCT 9 07/19/2012   EOSPCT 0 07/19/2012   BASOPCT 0 07/19/2012    CMP: Lab Results  Component Value Date   NA 138 07/24/2012   K 4.5 07/25/2012   CL 107 07/24/2012   CO2 23 07/24/2012   BUN 12 07/24/2012   CREATININE 1.18* 07/24/2012   CREATININE 1.21* 04/01/2012   PROT 6.2 07/24/2012   ALBUMIN 2.1* 07/24/2012   BILITOT 0.2* 07/24/2012   ALKPHOS 182* 07/24/2012   AST 14 07/24/2012   ALT 14 07/24/2012  .   Discharge Instructions     Follow with Primary MD  DIXON,MARY BETH, PA-C in 7 days . Her JP drain needs flushing with 5 cc normal saline daily by home nurse. Or family member  Get CBC, CMP, checked 7 days by Primary MD and again as instructed by your Primary MD.   Get Medicines reviewed and adjusted.  Please request your Prim.MD to go over all Hospital Tests and Procedure/Radiological results at the follow up, please get all Hospital records sent to your Prim MD by signing hospital release before you go home.  Activity: As tolerated with Full fall precautions use walker/cane & assistance as needed   Diet:  Heart healthy - Low carb  For Heart failure patients - Check your Weight same time everyday, if you gain over 2 pounds, or you develop in leg swelling, experience more shortness of breath or chest pain, call your Primary MD immediately. Follow Cardiac Low Salt Diet and 1.8 lit/day fluid restriction.  Disposition Home   If you experience worsening of your admission symptoms, develop shortness of breath, life threatening emergency, suicidal or homicidal thoughts you must seek medical attention immediately by calling 911 or calling your MD immediately  if symptoms less severe.  Please note  You were cared for by a hospitalist during your hospital stay. If you have any questions about your discharge medications or the care you received while you were in the hospital after you are discharged, you can call the unit and asked to speak with the hospitalist on call if the hospitalist that took care of you is not available. Once you are discharged, your primary care physician will handle any further medical issues. Please note that NO REFILLS for any discharge medications will be authorized once you are discharged, as it is imperative that you return to your primary care physician (or establish a relationship with a primary care physician if you do not have one) for your aftercare needs so that they can reassess your need for medications and monitor  your lab values.   Follow-up Information   Follow up with Gwenyth Ober, MD On 07/30/2012. (APPOINTMENT TIME: 8:45AM.  PLEASE ARRIVE 30 MINUTES PRIOR TO YOUR APPOINTMENT IN ORDER TO COMPLETE PAPERWORK AT CHECK IN)    Contact information:   [address], [address] [place] [address] [phone]       Follow up with [facility] BETH, PA-C. Schedule an appointment as soon as possible for a visit in 1 week.   Contact information:   [place] [address] [phone]       Follow up with [facility], Antonietta Jewel, MD. Schedule an appointment as soon as possible for a visit in 1 week.   Contact information:   [id number] [place] [address] [phone]         Discharge Medications     Medication List         acetaminophen 500 MG tablet  Commonly known as:  TYLENOL  Take 500 mg by mouth every 6 (six) hours as needed for pain.     amLODipine 10 MG tablet  Commonly known as:  NORVASC  Take 1 tablet (10 mg total) by mouth daily.     ciprofloxacin 500 MG tablet  Commonly known as:  CIPRO  Take 1 tablet (500 mg total) by mouth 2 (two) times daily.     cloNIDine 0.1 mg/24hr patch  Commonly known as:  CATAPRES - Dosed in mg/24 hr  Place 2 patches onto the skin once a week. APPLIED ON TUESDAYS     glipiZIDE 5 MG tablet  Commonly known as:  GLUCOTROL  Take 1 tablet (5 mg total) by mouth daily.     hydrochlorothiazide 12.5 MG capsule  Commonly known as:  MICROZIDE  Take 1 capsule (12.5 mg total) by mouth daily.     insulin glargine 100 UNIT/ML injection  Commonly known as:  LANTUS  Inject 0.15 mLs (15 Units total) into the skin every morning.     lisinopril 20 MG tablet  Commonly known as:  PRINIVIL,ZESTRIL  Take 20-40 mg by mouth 2 (two) times daily. TAKES TWO TABLETS IN THE MORNING AND ONE TABLET AT BEDTIME           Total Time in preparing paper work, data  evaluation and todays exam - 60 minutes  Thurnell Lose M.D on 07/25/2012 at 10:57 AM  [facility] Office  [phone]

## 2012-07-25 NOTE — Progress Notes (Signed)
[name]. [name], MD, [facility] Surgery  General/ Trauma Surgery  07/25/2012 9:32 AM

## 2012-07-25 NOTE — Progress Notes (Signed)
Physical Therapy Treatment Patient Details Name: Laura Best MRN: [id number] DOB: [birth year] Today's Date: 07/25/2012 Time: [id number] PT Time Calculation (min): 14 min  PT Assessment / Plan / Recommendation  PT Comments   Definitely needs some HHPT to improve gait stability.  Should be safe with family assist.  Follow Up Recommendations  Home health PT;Supervision for mobility/OOB     Does the patient have the potential to tolerate intense rehabilitation     Barriers to Discharge        Equipment Recommendations  3in1 (PT)    Recommendations for Other Services    Frequency Min 3X/week   Progress towards PT Goals    Plan Current plan remains appropriate    Precautions / Restrictions Precautions Precautions: Fall Restrictions Weight Bearing Restrictions: No   Pertinent Vitals/Pain     Mobility  Bed Mobility Bed Mobility: Not assessed Details for Bed Mobility Assistance: vc's for best technique supine to sit Transfers Transfers: Sit to Stand;Stand to Sit Sit to Stand: 4: Min guard;With upper extremity assist;From chair/3-in-1 Stand to Sit: 4: Min guard;With upper extremity assist;To chair/3-in-1 Details for Transfer Assistance: generally safe mobility, used hands appropriately Ambulation/Gait Ambulation/Gait Assistance: 4: Min guard Ambulation Distance (Feet): 160 Feet Assistive device: None (occaisionally reaching for the rail) Ambulation/Gait Assistance Details: tentative gait with hands held at high guard.  short at times shuffled steps. Gait Pattern: Step-through pattern;Shuffle;Narrow base of support Stairs: No Wheelchair Mobility Wheelchair Mobility: No    Exercises     PT Diagnosis:    PT Problem List:   PT Treatment Interventions:     PT Goals (current goals can now be found in the care plan section) Acute Rehab PT Goals Patient Stated Goal: able to get back home safe. Time For Goal Achievement: 07/30/12 Potential to Achieve Goals: Good  Visit  Information  Last PT Received On: 07/25/12 Assistance Needed: +1 History of Present Illness: 3 week h/o of on/off sweats and abdominal pain    Subjective Data  Subjective: I'm going home....when he gets here.Marland KitchenMarland KitchenI'll be good. Patient Stated Goal: able to get back home safe.   Cognition  Cognition Arousal/Alertness: Awake/alert Behavior During Therapy: WFL for tasks assessed/performed Overall Cognitive Status: Within Functional Limits for tasks assessed    Balance  Balance Balance Assessed: No (mildly unsteady gait without assistive device)  End of Session PT - End of Session Activity Tolerance: Patient tolerated treatment well Patient left: with call bell/phone within reach;in chair Nurse Communication: Mobility status   GP     Shaunessy Dobratz, [name] 07/25/2012, 1:37 PM 07/25/2012  [name], [place] [phone]  (pager)

## 2012-07-25 NOTE — Progress Notes (Signed)
Patient given discharged instructions, reinforced teaching on how to empty JP drain, return demonstrated and verbalized understanding.

## 2012-07-26 LAB — CULTURE, BLOOD (ROUTINE X 2): Culture: NO GROWTH

## 2012-07-30 ENCOUNTER — Encounter (INDEPENDENT_AMBULATORY_CARE_PROVIDER_SITE_OTHER): Payer: Self-pay | Admitting: General Surgery

## 2012-07-30 ENCOUNTER — Ambulatory Visit (INDEPENDENT_AMBULATORY_CARE_PROVIDER_SITE_OTHER): Payer: Medicare Other | Admitting: General Surgery

## 2012-07-30 VITALS — BP 180/62 | HR 84 | Temp 97.8°F | Resp 16 | Ht 63.0 in | Wt 143.4 lb

## 2012-07-30 DIAGNOSIS — K3533 Acute appendicitis with perforation and localized peritonitis, with abscess: Secondary | ICD-10-CM | POA: Insufficient documentation

## 2012-07-30 NOTE — Progress Notes (Signed)
The patient comes in status post percutaneous drainage of a periappendiceal abscess. She states that she is doing very well. She has minimal pain. Her appetite is good. Her bowels are working well.  She is draining approximately 20-25 cc of cloudy fluid from her drain per day. This is at a level where I believe we can get a CT scan of the abdomen and pelvis to look and see if the abscess cavity has resolved. If so then the drain can be removed. I'll see the patient back within 2 weeks to determine if she needs an interval appendectomy. Otherwise no other treatment is necessary at this time. She should continue to take the ciprofloxacin until after she said the CT scan.

## 2012-07-31 ENCOUNTER — Encounter: Payer: Self-pay | Admitting: Physician Assistant

## 2012-07-31 ENCOUNTER — Ambulatory Visit (INDEPENDENT_AMBULATORY_CARE_PROVIDER_SITE_OTHER): Payer: Medicare Other | Admitting: Physician Assistant

## 2012-07-31 VITALS — BP 146/76 | HR 76 | Temp 98.1°F | Resp 20 | Ht 63.25 in | Wt 140.0 lb

## 2012-07-31 DIAGNOSIS — E1165 Type 2 diabetes mellitus with hyperglycemia: Secondary | ICD-10-CM

## 2012-07-31 DIAGNOSIS — IMO0001 Reserved for inherently not codable concepts without codable children: Secondary | ICD-10-CM

## 2012-07-31 DIAGNOSIS — E785 Hyperlipidemia, unspecified: Secondary | ICD-10-CM

## 2012-07-31 DIAGNOSIS — I10 Essential (primary) hypertension: Secondary | ICD-10-CM

## 2012-07-31 DIAGNOSIS — K3533 Acute appendicitis with perforation and localized peritonitis, with abscess: Secondary | ICD-10-CM

## 2012-07-31 NOTE — Progress Notes (Signed)
Patient ID: MATTISYN DENHERDER MRN: [id number], DOB: 14-Nov-1940, 72 y.o. Date of Encounter: 07/31/2012, 2:55 PM    Chief Complaint:  Chief Complaint  Patient presents with  . hospital follow up    intra abdominal abscess  still has drain in place     HPI: 72 y.o. year old white female presents for f/u hospitalization. She was hospitalized 07/19/12-07/25/2012. She was found to have periappendiceal abscess. She still has percutaneous drain in place. She saw Dr. Hulen Skains yesterday for f/u. He has ordered a CT, which pt says is scheduled for tomorrow morning. If the abscess cavity is resolved, then drain can be removed. She is on Cipro. She has minimal pain. Her appetite is good. She has normal b.m.s.  She tells me that all of her pre-hospitlaization meds are the same. Seh is taking all meds the same as she was prior to hospitalization. The only thing different is that she is also taking cipro now. She is feeling good. No complaints.   Home Meds: See attached medication section for any medications that were entered at today's visit. The computer does not put those onto this list.The following list is a list of meds entered prior to today's visit.   Current Outpatient Prescriptions on File Prior to Visit  Medication Sig Dispense Refill  . acetaminophen (TYLENOL) 500 MG tablet Take 500 mg by mouth every 6 (six) hours as needed for pain.      Marland Kitchen amLODipine (NORVASC) 10 MG tablet Take 1 tablet (10 mg total) by mouth daily.  90 tablet  1  . ciprofloxacin (CIPRO) 500 MG tablet Take 1 tablet (500 mg total) by mouth 2 (two) times daily.  14 tablet  0  . cloNIDine (CATAPRES - DOSED IN MG/24 HR) 0.1 mg/24hr patch Place 2 patches onto the skin once a week. APPLIED ON TUESDAYS      . glipiZIDE (GLUCOTROL) 5 MG tablet Take 1 tablet (5 mg total) by mouth daily.  30 tablet  2  . hydrochlorothiazide (MICROZIDE) 12.5 MG capsule Take 1 capsule (12.5 mg total) by mouth daily.  30 capsule  6  . insulin glargine (LANTUS) 100  UNIT/ML injection Inject 0.15 mLs (15 Units total) into the skin every morning.  10 mL  2  . lisinopril (PRINIVIL,ZESTRIL) 20 MG tablet Take 20-40 mg by mouth 2 (two) times daily. TAKES TWO TABLETS IN THE MORNING AND ONE TABLET AT BEDTIME       No current facility-administered medications on file prior to visit.    Allergies:  Allergies  Allergen Reactions  . Aspirin   . Metformin And Related Diarrhea      Review of Systems: See HPI for pertinent ROS. All other ROS negative.    Physical Exam: Blood pressure 146/76, pulse 76, temperature 98.1 F (36.7 C), temperature source Oral, resp. rate 20, height 5' 3.25" (1.607 m), weight 140 lb (63.504 kg)., Body mass index is 24.59 kg/(m^2). General: WNWD WF. Appears in no acute distress. Neck: Supple. No thyromegaly. No lymphadenopathy. Lungs: Clear bilaterally to auscultation without wheezes, rales, or rhonchi. Breathing is unlabored. Heart: Regular rhythm. No murmurs, rubs, or gallops. Abdomen: Drain in place. No sign of infection at skin.  Msk:  Strength and tone normal for age. Extremities/Skin: Warm and dry. No clubbing or cyanosis. No edema. No rashes or suspicious lesions. Neuro: Alert and oriented X 3. Moves all extremities spontaneously. Gait is normal. CNII-XII grossly in tact. Psych:  Responds to questions appropriately with a normal affect.  ASSESSMENT AND PLAN:  72 y.o. year old female with  1. Appendiceal abscess S/P hospitlization for this.   2. Diabetes type 2, uncontrolled  3. Hyperlipidemia  4. Hypertension  I reviewed her LOV note 06/13/12. A1C 5/22 was 7.2.  She already has f/u ROV scheduled for August. F/U then, sooner if needed.    Marin Olp [place], [place], [facility] 07/31/2012 2:55 PM

## 2012-08-01 ENCOUNTER — Encounter (HOSPITAL_COMMUNITY): Payer: Self-pay

## 2012-08-01 ENCOUNTER — Telehealth (INDEPENDENT_AMBULATORY_CARE_PROVIDER_SITE_OTHER): Payer: Self-pay | Admitting: General Surgery

## 2012-08-01 ENCOUNTER — Ambulatory Visit (HOSPITAL_COMMUNITY)
Admission: RE | Admit: 2012-08-01 | Discharge: 2012-08-01 | Disposition: A | Discharge status: Home / Self Care | Payer: Medicare Other | Source: Ambulatory Visit | Attending: General Surgery | Admitting: General Surgery

## 2012-08-01 DIAGNOSIS — K3533 Acute appendicitis with perforation and localized peritonitis, with abscess: Secondary | ICD-10-CM | POA: Insufficient documentation

## 2012-08-01 DIAGNOSIS — I700 Atherosclerosis of aorta: Secondary | ICD-10-CM | POA: Insufficient documentation

## 2012-08-01 MED ORDER — IOHEXOL 300 MG/ML  SOLN
80.0000 mL | Freq: Once | INTRAMUSCULAR | Status: AC | PRN
  Administered 2012-08-01: 80 mL via INTRAVENOUS

## 2012-08-01 NOTE — Telephone Encounter (Signed)
Lucky with [facility] called to get a prescription called in for patient's saline syringes for daily flushes of her drain. Saline syringes, 5 cc, #30 called to [facility]. They will call back with any questions.

## 2012-08-13 ENCOUNTER — Encounter (INDEPENDENT_AMBULATORY_CARE_PROVIDER_SITE_OTHER): Payer: Self-pay | Admitting: General Surgery

## 2012-08-13 ENCOUNTER — Ambulatory Visit (INDEPENDENT_AMBULATORY_CARE_PROVIDER_SITE_OTHER): Payer: Medicare Other | Admitting: General Surgery

## 2012-08-13 VITALS — BP 120/58 | HR 64 | Resp 16 | Ht 63.0 in | Wt 140.4 lb

## 2012-08-13 DIAGNOSIS — Z09 Encounter for follow-up examination after completed treatment for conditions other than malignant neoplasm: Secondary | ICD-10-CM

## 2012-08-13 NOTE — Progress Notes (Signed)
The patient continues to do well. A repeat CAT scan approximately 2 weeks ago showed complete resolution of her abscess cavity in the left lower quadrant. Appropriately I removed her percutaneous drain today after discussing her case with the interventional radiologist.  She's had minimal to no drainage from her drain daily. She's been afebrile and is eating well and having regular bowel movements. I will see her back in approximately 3-4 weeks for reassessment for possible interval appendectomy.

## 2012-09-05 ENCOUNTER — Ambulatory Visit (INDEPENDENT_AMBULATORY_CARE_PROVIDER_SITE_OTHER): Payer: Medicare Other | Admitting: General Surgery

## 2012-09-05 ENCOUNTER — Encounter (INDEPENDENT_AMBULATORY_CARE_PROVIDER_SITE_OTHER): Payer: Self-pay | Admitting: General Surgery

## 2012-09-05 VITALS — BP 120/64 | HR 72 | Resp 16 | Ht 63.0 in | Wt 137.2 lb

## 2012-09-05 DIAGNOSIS — Z09 Encounter for follow-up examination after completed treatment for conditions other than malignant neoplasm: Secondary | ICD-10-CM

## 2012-09-05 NOTE — Progress Notes (Signed)
The patient is doing very well status post removal of the percutaneous drain for her ruptured left-sided appendix.  Her post drainage CT scan did not demonstrate any evidence of  Recurring inflammation. Because of this I do not think that she needs an interval appendectomy at this time. However when I discharged patient from clinic until I see her again in 3 months. If at that time she continues to do well we'll discharge her from clinic and not consider an interval appendectomy at that time. The most important feature to consider in this patient is that her appendix and her cecum are on the left side and at the procedures needed in the future this has to be kept in mind.

## 2012-09-12 ENCOUNTER — Ambulatory Visit (INDEPENDENT_AMBULATORY_CARE_PROVIDER_SITE_OTHER): Payer: Medicare Other | Admitting: Physician Assistant

## 2012-09-12 ENCOUNTER — Encounter: Payer: Self-pay | Admitting: Physician Assistant

## 2012-09-12 VITALS — BP 140/78 | HR 60 | Resp 18 | Ht 63.0 in | Wt 141.0 lb

## 2012-09-12 DIAGNOSIS — IMO0002 Reserved for concepts with insufficient information to code with codable children: Secondary | ICD-10-CM

## 2012-09-12 DIAGNOSIS — IMO0001 Reserved for inherently not codable concepts without codable children: Secondary | ICD-10-CM

## 2012-09-12 DIAGNOSIS — E785 Hyperlipidemia, unspecified: Secondary | ICD-10-CM

## 2012-09-12 DIAGNOSIS — E1165 Type 2 diabetes mellitus with hyperglycemia: Secondary | ICD-10-CM

## 2012-09-12 DIAGNOSIS — I10 Essential (primary) hypertension: Secondary | ICD-10-CM

## 2012-09-12 DIAGNOSIS — J449 Chronic obstructive pulmonary disease, unspecified: Secondary | ICD-10-CM

## 2012-09-12 LAB — HEMOGLOBIN A1C, FINGERSTICK: Hgb A1C (fingerstick): 5.7 % (ref <5.7)

## 2012-09-12 NOTE — Progress Notes (Signed)
Patient ID: Laura Best MRN: [id number], DOB: 1940/05/14, 72 y.o. Date of Encounter: @DATE @  Chief Complaint:  Chief Complaint  Patient presents with  . Follow-up    HPI: 72 y.o. year old white female  presents for routine followup office visit followup for diabetes. I had been seeing her routinely in the past up until March of 2012. However after her office visit March 2012 I did not see her until 06/13/2012. At that time she told me that even if she has insurance she did not have the money to pay her co-pay so she had not come in for any office visits during that time. When she came on 06/13/2012 she reported that she had been continuing to use Lantus 15 units every morning during this entire time until she just ran out 4 days prior to that office visit. She also reported that she had been seeing [facility], who had been managing her blood pressure and cholesterol.  She was hospitalized 07/19/2012 3 07/25/2012. She had a periappendiceal abscess.she underwent surgery by Dr. Hulen Skains. Today she says that she saw Dr. Hulen Skains last week. She said everything was looking good and that the incision has healed. She says that he has released her from his care.  She has no complaints today. She did not bring in a blood sugar log. She says that she does exercise and that this includes doing leg lifts and walking to the mailbox and back. She is not very careful with monitoring her carbohydrate intake.   Past Medical History  Diagnosis Date  . Diabetes mellitus   . Hypertension   . Hyperlipidemia   . Tricuspid regurgitation     Echo 05/02/06-Nml LV. Mild MR. Mod TR  . Osteoporosis   . Diastolic heart failure      Home Meds: See attached medication section for current medication list. Any medications entered into computer today will not appear on this note's list. The medications listed below were entered prior to today. Current Outpatient Prescriptions on File Prior to Visit  Medication Sig  Dispense Refill  . acetaminophen (TYLENOL) 500 MG tablet Take 500 mg by mouth every 6 (six) hours as needed for pain.      Marland Kitchen amLODipine (NORVASC) 10 MG tablet Take 1 tablet (10 mg total) by mouth daily.  90 tablet  1  . cloNIDine (CATAPRES - DOSED IN MG/24 HR) 0.1 mg/24hr patch Place 2 patches onto the skin once a week. APPLIED ON TUESDAYS      . hydrochlorothiazide (MICROZIDE) 12.5 MG capsule Take 1 capsule (12.5 mg total) by mouth daily.  30 capsule  6  . insulin glargine (LANTUS) 100 UNIT/ML injection Inject 0.15 mLs (15 Units total) into the skin every morning.  10 mL  2  . lisinopril (PRINIVIL,ZESTRIL) 20 MG tablet Take 20-40 mg by mouth 2 (two) times daily. TAKES TWO TABLETS IN THE MORNING AND ONE TABLET AT BEDTIME       No current facility-administered medications on file prior to visit.    Allergies:  Allergies  Allergen Reactions  . Aspirin   . Metformin And Related Diarrhea    History   Social History  . Marital Status: Widowed    Spouse Name: N/A    Number of Children: N/A  . Years of Education: N/A   Occupational History  . Not on file.   Social History Main Topics  . Smoking status: Former Research scientist (life sciences)  . Smokeless tobacco: Not on file  . Alcohol Use: No  .  Drug Use: No  . Sexual Activity: Not on file   Other Topics Concern  . Not on file   Social History Narrative  . No narrative on file    Family History  Problem Relation Age of Onset  . Cancer Father   . Diabetes Father   . Cancer Brother     Brain  . Cancer Sister     abdominal fat     Review of Systems:  See HPI for pertinent ROS. All other ROS negative.    Physical Exam: Blood pressure 140/78, pulse 60, resp. rate 18, height 5\' 3"  (1.6 m), weight 141 lb (63.957 kg)., Body mass index is 24.98 kg/(m^2). General: well-nourished well-developed white female. Appears in no acute distress. Neck: Supple. No thyromegaly. No lymphadenopathy. Lungs: Clear bilaterally to auscultation without wheezes,  rales, or rhonchi. Breathing is unlabored. Heart: RRR with S1 S2. No murmurs, rubs, or gallops. Abdomen: Soft,  non-distended with normoactive bowel sounds. No hepatomegaly. No rebound/guarding. No obvious abdominal masses. Musculoskeletal:  Strength and tone normal for age. Extremities/Skin: Warm and dry. No clubbing or cyanosis. No edema. No rashes or suspicious lesions. Neuro: Alert and oriented X 3. Moves all extremities spontaneously. Gait is normal. CNII-XII grossly in tact. Psych:  Responds to questions appropriately with a normal affect. Diabetic foot exam: See attached section     ASSESSMENT AND PLAN:  72 y.o. year old female with  1. Diabetes type 2, uncontrolled She had a 1 CM microalbumin performed 06/13/2012. We'll repeat A1c now. - Hemoglobin A1C, fingerstick We'll stop the glipizide to avoid any hypoglycemia. 2 need Lantus. I have given her a paper prescription for a new glucose meter strips and supplies. The readings that she reported to me at the last office visit made no sense. Ideally she needs to be on an oral agent but I'm concerned about hypoglycemia with sulfa rhinorrhea. She cannot tolerate metformin because of past GI adverse effects. We are not I'm avoiding activities given her cardiac history. She cannot afford financially to take any brand-name medicines.  2. Hyperlipidemia On statin and this is monitored by cardiology  3. Hypertension \\slightly  suboptimal today. However blood pressure readings have been at goal. We'll continue current medication at this time she has routine be managed by cardiology  Regular office visit 3 months.   Signed, [address] [place], [place], [facility] 09/12/2012 1:50 PM

## 2012-09-13 ENCOUNTER — Ambulatory Visit: Payer: Medicare Other | Admitting: Physician Assistant

## 2012-10-30 ENCOUNTER — Other Ambulatory Visit: Payer: Self-pay | Admitting: Physician Assistant

## 2012-10-30 NOTE — Telephone Encounter (Signed)
Verified with patient that she is using insulin vials.  Medication refilled per protocol.

## 2012-11-25 ENCOUNTER — Telehealth: Payer: Self-pay | Admitting: Family Medicine

## 2012-11-25 NOTE — Telephone Encounter (Signed)
Nurse Pract. home visit per her [place].  Wanted to relay her assessment to you.  BP 170/60 today.  Gets nerves when someone comes by house patient told her.  Has COPD c/o shortness of breath with exertion.  Nurse heard wheezing, no expiratory sounds.  Wants your thoughts on an inhaler to help her. Noticed she was not on any type COPD medication.

## 2012-11-26 NOTE — Telephone Encounter (Signed)
Have her schedule OV.  We need to examine her and listen to her lungs.  As well, we can recheck her BP prior to adjusting meds. LOV was 09/12/12--BP was 140/78. Have her bring all of her medication bottles to OV to make sue she has not accidentally stopped any BP med.

## 2012-11-26 NOTE — Telephone Encounter (Signed)
Pt has routine appt for 12/16/12.  She said she can discuss with you then about all of this. She says right now she is just fine.

## 2012-12-16 ENCOUNTER — Encounter: Payer: Self-pay | Admitting: Physician Assistant

## 2012-12-16 ENCOUNTER — Ambulatory Visit (INDEPENDENT_AMBULATORY_CARE_PROVIDER_SITE_OTHER): Payer: Medicare Other | Admitting: Physician Assistant

## 2012-12-16 VITALS — BP 160/84 | HR 72 | Temp 98.4°F | Resp 18 | Wt 147.0 lb

## 2012-12-16 DIAGNOSIS — Z23 Encounter for immunization: Secondary | ICD-10-CM

## 2012-12-16 DIAGNOSIS — M810 Age-related osteoporosis without current pathological fracture: Secondary | ICD-10-CM | POA: Insufficient documentation

## 2012-12-16 DIAGNOSIS — I272 Pulmonary hypertension, unspecified: Secondary | ICD-10-CM

## 2012-12-16 DIAGNOSIS — E1165 Type 2 diabetes mellitus with hyperglycemia: Secondary | ICD-10-CM

## 2012-12-16 DIAGNOSIS — J449 Chronic obstructive pulmonary disease, unspecified: Secondary | ICD-10-CM

## 2012-12-16 DIAGNOSIS — J4489 Other specified chronic obstructive pulmonary disease: Secondary | ICD-10-CM

## 2012-12-16 DIAGNOSIS — E559 Vitamin D deficiency, unspecified: Secondary | ICD-10-CM

## 2012-12-16 DIAGNOSIS — I5032 Chronic diastolic (congestive) heart failure: Secondary | ICD-10-CM

## 2012-12-16 DIAGNOSIS — I10 Essential (primary) hypertension: Secondary | ICD-10-CM

## 2012-12-16 DIAGNOSIS — IMO0001 Reserved for inherently not codable concepts without codable children: Secondary | ICD-10-CM

## 2012-12-16 DIAGNOSIS — E785 Hyperlipidemia, unspecified: Secondary | ICD-10-CM

## 2012-12-16 DIAGNOSIS — I2789 Other specified pulmonary heart diseases: Secondary | ICD-10-CM

## 2012-12-16 LAB — COMPLETE METABOLIC PANEL WITH GFR
ALT: 12 U/L (ref 0–35)
Albumin: 4.2 g/dL (ref 3.5–5.2)
CO2: 23 mEq/L (ref 19–32)
Calcium: 10 mg/dL (ref 8.4–10.5)
Chloride: 108 mEq/L (ref 96–112)
Creat: 1.54 mg/dL — ABNORMAL HIGH (ref 0.50–1.10)
GFR, Est African American: 39 mL/min — ABNORMAL LOW

## 2012-12-16 LAB — LIPID PANEL
LDL Cholesterol: 117 mg/dL — ABNORMAL HIGH (ref 0–99)
Triglycerides: 154 mg/dL — ABNORMAL HIGH (ref <150)

## 2012-12-16 LAB — HEMOGLOBIN A1C
Hgb A1c MFr Bld: 6.6 % — ABNORMAL HIGH (ref <5.7)
Mean Plasma Glucose: 143 mg/dL — ABNORMAL HIGH (ref <117)

## 2012-12-16 MED ORDER — ALENDRONATE SODIUM 70 MG PO TABS: 70.0000 mg | ORAL_TABLET | ORAL | Status: DC

## 2012-12-17 ENCOUNTER — Other Ambulatory Visit: Payer: Self-pay | Admitting: Adult Health

## 2012-12-17 LAB — MICROALBUMIN, URINE: Microalb, Ur: 55.1 mg/dL — ABNORMAL HIGH (ref 0.00–1.89)

## 2012-12-17 NOTE — Progress Notes (Signed)
Patient ID: Laura Best MRN: [id number], DOB: [birth year], 72 y.o. Date of Encounter: @DATE @  Chief Complaint:  Chief Complaint  Patient presents with  . 3 mth check up    is fasting    HPI: 72 y.o. year old female  presents for routine followup visit.  I have been seeing her routinely in the past up until March 2012. However after her visit in March 2012 I did not see her until 06/13/12. At that time she told me that even though she has insurance she did not have the money to pay her co-pay so she had not come in for any office visits during that time.  When she came 06/13/12 she reported that she had been continuing to use Lantus 15 units every morning during that entire time. She also reported that she had been seeing [facility] who had been managing her blood pressure and cholesterol.  She was hospitalized 07/19/2012 through 07/25/2012. She had. Appendiceal abscess and underwent surgery by Dr. Hulen Skains. She has had no complications with this since that hospitalization. Says that she has her three-month checkup with Dr. Hulen Skains next week.  She brought in her blood sugar log. Fasting morning readings range from 65-123. 2 hours after lunch ranges 81-147. 2 hours after dinner ranges 126-142.  She is doing a routine exercise and says that she is not very top careful with her diet as far as carbohydrates.   Past Medical History  Diagnosis Date  . Diabetes mellitus   . Hypertension   . Hyperlipidemia   . Tricuspid regurgitation     Echo 05/02/06-Nml LV. Mild MR. Mod TR  . Osteoporosis   . Diastolic heart failure   . Vitamin D deficiency   . Osteoporosis      Home Meds: See attached medication section for current medication list. Any medications entered into computer today will not appear on this note's list. The medications listed below were entered prior to today. Current Outpatient Prescriptions on File Prior to Visit  Medication Sig Dispense Refill  . acetaminophen (TYLENOL)  500 MG tablet Take 500 mg by mouth every 6 (six) hours as needed for pain.      . cloNIDine (CATAPRES - DOSED IN MG/24 HR) 0.1 mg/24hr patch Place 2 patches onto the skin once a week. APPLIED ON TUESDAYS      . hydrochlorothiazide (MICROZIDE) 12.5 MG capsule Take 1 capsule (12.5 mg total) by mouth daily.  30 capsule  6  . LANTUS 100 UNIT/ML injection INJECT 15 UNITS SUBCUTANEOUSLY EVERY MORNING  10 mL  1  . lisinopril (PRINIVIL,ZESTRIL) 20 MG tablet Take 20-40 mg by mouth 2 (two) times daily. TAKES TWO TABLETS IN THE MORNING AND ONE TABLET AT BEDTIME       No current facility-administered medications on file prior to visit.    Allergies:  Allergies  Allergen Reactions  . Aspirin   . Metformin And Related Diarrhea    History   Social History  . Marital Status: Widowed    Spouse Name: N/A    Number of Children: N/A  . Years of Education: N/A   Occupational History  . Not on file.   Social History Main Topics  . Smoking status: Former Research scientist (life sciences)  . Smokeless tobacco: Not on file  . Alcohol Use: No  . Drug Use: No  . Sexual Activity: Not on file   Other Topics Concern  . Not on file   Social History Narrative  . No narrative on  file    Family History  Problem Relation Age of Onset  . Cancer Father   . Diabetes Father   . Cancer Brother     Brain  . Cancer Sister     abdominal fat     Review of Systems:  See HPI for pertinent ROS. All other ROS negative.    Physical Exam: Blood pressure 160/84, pulse 72, temperature 98.4 F (36.9 C), temperature source Oral, resp. rate 18, weight 147 lb (66.679 kg)., Body mass index is 26.05 kg/(m^2). General: WNWD WF. Appears in no acute distress. Neck: Supple. No thyromegaly. No lymphadenopathy. No carotid bruits. Lungs: Clear bilaterally to auscultation without wheezes, rales, or rhonchi. Breathing is unlabored. Heart: RRR with S1 S2. No murmurs, rubs, or gallops. Abdomen: Soft, non-tender, non-distended with normoactive bowel  sounds. No hepatomegaly. No rebound/guarding. No obvious abdominal masses. Musculoskeletal:  Strength and tone normal for age. Extremities/Skin: Warm and dry. No clubbing or cyanosis. No edema. No rashes or suspicious lesions. Neuro: Alert and oriented X 3. Moves all extremities spontaneously. Gait is normal. CNII-XII grossly in tact. Psych:  Responds to questions appropriately with a normal affect. Diabetic foot exam: Inspection is normal. No open wounds or nonhealing sores. Sensation testing is normal. There are no palpable dorsalis pedis or posterior tibial pulses bilaterally.      ASSESSMENT AND PLAN:  72 y.o. year old female with  1. Diabetes type 2, uncontrolled She is on ACE inhibitor therapy. She is not on any statin.  At last office visit with me 09/12/12 I reviewed her blood sugar readings. The readings made no sense. Glipizide was stopped b/c I was concerned about possible hypoglycemia. Today she does state that she did stop the glipizide as directed. Ideally she needs to be on an oral agent but I was concerned about hypoglycemia with sulfonylurea. She cannot tolerate metformin secondary to past GI adverse effects. As well she is now having elevated creatinine which prohibits this. I have been avoiding Actos  given her cardiac history. She cannot financially afford to take any brand name medications. Will further discuss these options with her at her followup office visit with me in one week.  We'll discuss diabetic eye exam at her followup visit. We'll also further discuss proper foot care, proper diet, proper exercise. - COMPLETE METABOLIC PANEL WITH GFR - Hemoglobin A1c - Microalbumin, urine  2. Hyperlipidemia - COMPLETE METABOLIC PANEL WITH GFR - Lipid panel  3. Hypertension Blood pressure is high today at 160/84. At last office visit here blood pressure was 140/78. Today I  reviewed last office note by cardiology dated 06/2012. They had performed a renal artery ultrasound  which was negative. According to their note, they reviewed the fact that she has labile blood pressure readings and they have also felt that pt has white coat hypertension. Patient states that when [facility]  staff has come to her house and check blood pressure that they have gotten good blood pressure readings and she says that when she went to the eye doctor her blood pressure was good. Cardiology note does state that her clonidine dose is 0.2. Patient states that she does have 0.1 patches but she wears 2 at a time to equal 0.2 mg. She reports that she checks her blood pressure at home and gets varied readings. Says she checked it  this morning -- it was 159/72 but lots of times the systolic number is [id number],  [id number]. She says that she checks her blood pressure 3 times  a day. - COMPLETE METABOLIC PANEL WITH GFR  4. Vitamin D deficiency - Vit D  25 hydroxy (rtn osteoporosis monitoring)  5. Osteoporosis I reviewed her chart and saw that she had been on Fosamax in the past but that this is not on her current medication list. She says that this was stopped back when she was unable to come here for followup secondary to finances. She is quite certain that she did not have any adverse effects or any other reasons for stopping the medication other than finances. She is agreeable to restart this. - alendronate (FOSAMAX) 70 MG tablet; Take 1 tablet (70 mg total) by mouth every 7 (seven) days. Take with a full glass of water on an empty stomach.  Dispense: 4 tablet; Refill: 11  6. COPD (chronic obstructive pulmonary disease) She reports that she quit smoking 4 years ago.  7. Pulmonary hypertension  8. Chronic diastolic heart failure  9. Need for prophylactic vaccination and inoculation against influenza - Flu Vaccine QUAD 36+ mos PF IM (Fluarix)  I discussed with her that we really need to update her preventive medicine etc. However, she has many active issues to address, but we do not have time to cover  everything today. I recommended that she schedule a followup office visit with me in approximately one week to do a complete physical exam. She is agreeable with this approach and we'll proceed with doing so.  Marin Olp [place], [place], [facility] 12/17/2012 12:09 PM

## 2012-12-20 ENCOUNTER — Telehealth: Payer: Self-pay | Admitting: Family Medicine

## 2012-12-20 DIAGNOSIS — E785 Hyperlipidemia, unspecified: Secondary | ICD-10-CM

## 2012-12-20 DIAGNOSIS — Z79899 Other long term (current) drug therapy: Secondary | ICD-10-CM

## 2012-12-20 MED ORDER — SIMVASTATIN 20 MG PO TABS: 20.0000 mg | ORAL_TABLET | Freq: Every day | ORAL | Status: DC

## 2012-12-20 NOTE — Telephone Encounter (Signed)
Pt aware of lab results and provider recommendations.  Rx's to pharmacy.  HCTZ removed from med list

## 2012-12-20 NOTE — Telephone Encounter (Signed)
Message copied by Olena Mater on Fri Dec 20, 2012 11:38 AM ------      Message from: Dena Billet      Created: Tue Dec 17, 2012  9:43 AM       Tell patient to:      1- stop the HCTZ/Microzide      2- start simvastatin 20 mg 1 by mouth each bedtime         Tell her she will need to recheck fasting labs in 6 weeks.      3- start over-the-counter vitamin D 4000 units daily      Tell patient to keep followup office visit for complete physical exam as we discussed yesterday.      Place order for the simvastatin and for future FLP LFT. ------

## 2012-12-24 ENCOUNTER — Encounter (INDEPENDENT_AMBULATORY_CARE_PROVIDER_SITE_OTHER): Payer: Self-pay | Admitting: General Surgery

## 2012-12-24 ENCOUNTER — Ambulatory Visit (INDEPENDENT_AMBULATORY_CARE_PROVIDER_SITE_OTHER): Payer: Medicare Other | Admitting: General Surgery

## 2012-12-24 ENCOUNTER — Telehealth (INDEPENDENT_AMBULATORY_CARE_PROVIDER_SITE_OTHER): Payer: Self-pay | Admitting: *Deleted

## 2012-12-24 VITALS — BP 134/78 | HR 64 | Temp 98.0°F | Resp 18 | Ht 63.0 in | Wt 151.8 lb

## 2012-12-24 DIAGNOSIS — K3533 Acute appendicitis with perforation and localized peritonitis, with abscess: Secondary | ICD-10-CM

## 2012-12-24 NOTE — Progress Notes (Signed)
The patient comes in today for long-term followup status post percutaneous drainage of a periappendiceal abscess drainage of an appendix on the left side. The patient symptomatically is doing well. She has no, pain. She appears to be well with no evidence of infection.  Because of the possibility that this appendix had some type of malignant growth all will go ahead and get a CT scan of her abdomen and pelvis with followup to be on an as-needed basis based on the findings of the CT scan.

## 2012-12-24 NOTE — Telephone Encounter (Signed)
I spoke with pt and informed her of the appt for her CT scan at [facility] on 12/4 with an arrival time of 3:25pm.  Instructed pt on when to drink her contrast and to have no solid foods 4 hours prior to scan.  Pt is agreeable with this appt at this time.

## 2012-12-25 ENCOUNTER — Encounter: Payer: Self-pay | Admitting: Physician Assistant

## 2012-12-25 ENCOUNTER — Ambulatory Visit (INDEPENDENT_AMBULATORY_CARE_PROVIDER_SITE_OTHER): Payer: Medicare Other | Admitting: Physician Assistant

## 2012-12-25 VITALS — BP 180/94 | HR 60 | Temp 97.5°F | Resp 18 | Ht 64.0 in | Wt 152.0 lb

## 2012-12-25 DIAGNOSIS — E559 Vitamin D deficiency, unspecified: Secondary | ICD-10-CM

## 2012-12-25 DIAGNOSIS — E785 Hyperlipidemia, unspecified: Secondary | ICD-10-CM

## 2012-12-25 DIAGNOSIS — I272 Pulmonary hypertension, unspecified: Secondary | ICD-10-CM

## 2012-12-25 DIAGNOSIS — D649 Anemia, unspecified: Secondary | ICD-10-CM

## 2012-12-25 DIAGNOSIS — Z1211 Encounter for screening for malignant neoplasm of colon: Secondary | ICD-10-CM

## 2012-12-25 DIAGNOSIS — IMO0001 Reserved for inherently not codable concepts without codable children: Secondary | ICD-10-CM

## 2012-12-25 DIAGNOSIS — J449 Chronic obstructive pulmonary disease, unspecified: Secondary | ICD-10-CM

## 2012-12-25 DIAGNOSIS — Z23 Encounter for immunization: Secondary | ICD-10-CM

## 2012-12-25 DIAGNOSIS — Z1239 Encounter for other screening for malignant neoplasm of breast: Secondary | ICD-10-CM

## 2012-12-25 DIAGNOSIS — I10 Essential (primary) hypertension: Secondary | ICD-10-CM

## 2012-12-25 DIAGNOSIS — M810 Age-related osteoporosis without current pathological fracture: Secondary | ICD-10-CM

## 2012-12-25 DIAGNOSIS — E1165 Type 2 diabetes mellitus with hyperglycemia: Secondary | ICD-10-CM

## 2012-12-25 DIAGNOSIS — N183 Chronic kidney disease, stage 3 unspecified: Secondary | ICD-10-CM

## 2012-12-25 DIAGNOSIS — I2789 Other specified pulmonary heart diseases: Secondary | ICD-10-CM

## 2012-12-25 HISTORY — DX: Chronic kidney disease, stage 3 unspecified: N18.30

## 2012-12-25 MED ORDER — CLONIDINE HCL 0.3 MG PO TABS: 0.3000 mg | ORAL_TABLET | Freq: Two times a day (BID) | ORAL | Status: DC

## 2012-12-25 NOTE — Progress Notes (Signed)
Patient ID: LENESHA WAXMAN MRN: [id number], DOB: Feb 04, 1940, 72 y.o. Date of Encounter: @DATE @  Chief Complaint:  Chief Complaint  Patient presents with  . Annual Exam    ??need PAP  last one 6/08, not fasting    HPI: 72 y.o. year old white female  presents for f/u OV.   She just recently had an office visit with me on 12/16/12, but because she had so many issues to address, I asked her to please schedule another followup office visit for [place] to further address everything.  I had been seeing her routinely in the past until March 2012. After that visit March 2012, I did not see her until 06/13/12. At that time, she told me that even though she had insurance, she did not have the money to pay her co-pay so she had not come in for any office visits during that time. She says that she only has to pay a $15 co-pay when she comes here. She says that her finances are better now. Says that she has paid off her son's truck and she no longer has that payment going out  every month.  When she came 06/13/12 she reported that she had  been continuing to use Lantus 15 units every morning through that entire period of time. She also reported that she had been seeing [facility] and they  were managing her blood pressure and cholesterol during that time.  She was hospitalized 07/19/12 through 07/25/12. She had appendiceal abscess and underwent surgery by Dr. Hulen Skains. She recently had her three-month checkup with Dr. Hulen Skains. She is to have a followup CT scan.  She has no complaints today.   Past Medical History  Diagnosis Date  . Diabetes mellitus   . Hypertension   . Hyperlipidemia   . Tricuspid regurgitation     Echo 05/02/06-Nml LV. Mild MR. Mod TR  . Osteoporosis   . Diastolic heart failure   . Vitamin D deficiency   . Osteoporosis   . CKD (chronic kidney disease) stage 3, GFR 30-59 ml/min 12/25/2012     Home Meds: See attached medication section for current medication list. Any medications  entered into computer today will not appear on this note's list. The medications listed below were entered prior to today. Current Outpatient Prescriptions on File Prior to Visit  Medication Sig Dispense Refill  . acetaminophen (TYLENOL) 500 MG tablet Take 500 mg by mouth every 6 (six) hours as needed for pain.      Marland Kitchen alendronate (FOSAMAX) 70 MG tablet Take 1 tablet (70 mg total) by mouth every 7 (seven) days. Take with a full glass of water on an empty stomach.  4 tablet  11  . amLODipine (NORVASC) 10 MG tablet TAKE ONE TABLET BY MOUTH ONCE DAILY  90 tablet  3  . LANTUS 100 UNIT/ML injection INJECT 15 UNITS SUBCUTANEOUSLY EVERY MORNING  10 mL  1  . lisinopril (PRINIVIL,ZESTRIL) 20 MG tablet Take 20-40 mg by mouth 2 (two) times daily. TAKES TWO TABLETS IN THE MORNING AND ONE TABLET AT BEDTIME      . simvastatin (ZOCOR) 20 MG tablet Take 1 tablet (20 mg total) by mouth at bedtime.  90 tablet  0   No current facility-administered medications on file prior to visit.    Allergies:  Allergies  Allergen Reactions  . Aspirin   . Metformin And Related Diarrhea    History   Social History  . Marital Status: Widowed  Spouse Name: N/A    Number of Children: N/A  . Years of Education: N/A   Occupational History  . Not on file.   Social History Main Topics  . Smoking status: Former Research scientist (life sciences)  . Smokeless tobacco: Not on file  . Alcohol Use: No  . Drug Use: No  . Sexual Activity: Not on file   Other Topics Concern  . Not on file   Social History Narrative  . No narrative on file    Family History  Problem Relation Age of Onset  . Cancer Father   . Diabetes Father   . Cancer Brother     Brain  . Cancer Sister     abdominal fat     Review of Systems:  See HPI for pertinent ROS. All other ROS negative.    Physical Exam: Blood pressure 180/94, pulse 60, temperature 97.5 F (36.4 C), temperature source Oral, resp. rate 18, height 5\' 4"  (1.626 m), weight 152 lb (68.947 kg).,  Body mass index is 26.08 kg/(m^2). General: WNWD WF. Appears in no acute distress. Neck: Supple. No thyromegaly. No lymphadenopathy.No carotid bruits. Lungs: Clear bilaterally to auscultation without wheezes, rales, or rhonchi. Breathing is unlabored. Heart: RRR with S1 S2. No murmurs, rubs, or gallops. Abdomen: Soft, non-tender, non-distended with normoactive bowel sounds. No hepatomegaly. No rebound/guarding. No obvious abdominal masses. Musculoskeletal:  Strength and tone normal for age. Extremities/Skin: Warm and dry. No clubbing or cyanosis. No edema. No rashes or suspicious lesions. Neuro: Alert and oriented X 3. Moves all extremities spontaneously. Gait is normal. CNII-XII grossly in tact. Psych:  Responds to questions appropriately with a normal affect. Diabetic foot exam:  Inspection is normal. No open wounds or nonhealing sores. Sensation testing is normal. There are no palpable dorsalis pedis or posterior tibial pulses bilaterally.      ASSESSMENT AND PLAN:  72 y.o. year old female with  1. Diabetes type 2, uncontrolled  She is on ACE inhibitor therapy. On maximum dose. He takes 20 mg twice a day. We just checked lipid panel 12/16/12 started statin therapy. She says that she has not yet come back to the pharmacy to pick up that prescription. She is only or to pick this up and to come back and do fasting lipid liver in 6 weeks.  She is currently on Lantus only. At her next visit in May discussed decreasing her Lantus dose and adding some type of overall medication for insulin sensitizer.  She had been on glipizide but I stopped that because of concern about possible hypoglycemia. She cannot take metformin secondary to past GI adverse effects and also now she has chronic kidney disease which prohibits use of this. Avoided activities given her cardiac history. I do not know if she could afford any branded medicines. We'll further discuss this at her next visit.  Today I discussed  with her that she needs to have an annual diabetic eye exam. She says that [facility] just called her yesterday about this. They are to schedule an appointment with an ophthalmologist for January.  Today I discussed proper foot care. Discussed the effects of the diabetes has on her blood vessels as bar as blood flow to her feet. Also discussed the effects it has on no nerves. Discussed that she may have decreased sensation and not feel pain and sores as a normal person would. Therefore needs to wear shoes and be very protective of her feet. Also discussed proper care regarding trimming her toenails  etc. If she ever does see any small sores developing that she needs to come in for evaluation immediately.  Discussed need for proper exercise. She states she gets outside and walks whenever possible. She definitely does this when the weather is nice and says even if it is somewhat cold she still tries to get out of remain active.  We have discussed carbohydrate diet in the past that have been a long time. I reviewed a low carbohydrate diet handout with her and discussed details of specific examples of things to avoid as well as gave her examples of meals that she can eat.   2. Hypertension At her last visit with me 12/16/12 blood pressure is 160/84.        Prior visit was 140/78.  At her last visit with me I reviewed her last office note by cardiology dated 06/2012. They had performed a renal artery ultrasound which was negative. In their notes they documented that they reviewed the fact that she has labile blood pressure readings.  They also felt the patient had white coat hypertension.  Patient reported to me that when the [facility] staff came to her house and checked blood pressure, they had gotten good blood pressure readings.  Also when the patient was checking it, she reported getting very labile radings. Says she had gone readings such as 159/72 but then also other times when the systolic  was in the [id number] and 130s.  Today she reports that yesterday when she saw Dr. Hulen Skains , they got 134/78. However, today Maudie Mercury checked it twice and got 180/84 and 188/90.  She is already on max dose ACE inhibitor. Her heart rate is low so I do not think I should add a beta blocker. Bystolic is going to be expensive. i will Increase her clonidine. She will stop using the patch and switch over to pill form. - cloNIDine (CATAPRES) 0.3 MG tablet; Take 1 tablet (0.3 mg total) by mouth 2 (two) times daily.  Dispense: 60 tablet; Refill: 5  3. CKD (chronic kidney disease) stage 3, GFR 30-59 ml/min Discussed her creatinine and GFR with her today. AlSo her microalbumin was very high. At the time that I got her lab results from 12/16/12, on that result note I stated to stop the HCTZ. She states that she has stopped this. I discussed with her that the 2 most common causes for kidney disease are uncontrolled hypertension and uncontrolled diabetes.  She had an excellent A1c though I know her sugar is controlled. Therefore I believe her kidney disease is secondary to uncontrolled hypertension. She is already on maximum dose ACE inhibitor. Even though she does have labile blood pressure readings,  none of them are too low. Therefore I think even if we increase the medication, she will not have hypotension or lightheadedness. - cloNIDine (CATAPRES) 0.3 MG tablet; Take 1 tablet (0.3 mg total) by mouth 2 (two) times daily.  Dispense: 60 tablet; Refill: 5  4. Hyperlipidemia She had FLP/LFT 12/16/12. I recommended she start simvastatin 20 mg. She has not yet picked up the medication but is agreeable to do so. Says that she forgot to go back to the pharmacy to get this. She will be picked up today. Recheck FLP/LFT 6 weeks.  5. Osteoporosis  at her last visit with me 12/16/12, reviewed her chart and saw that she had been on Fosamax in the past. However it was not on her current medication list as of the appointment in  November. She says  that she stopped this medication back when she was unable to come here for followup secondary to finances. She was quite certain that she did not have any adverse effects or any other reasons for stopping the medication other than finances. At the visit 12/16/12, she was agreeable to restart this. Day she says that she has restarted the Fosamax.  6. Vitamin D deficiency She had been taking 1000 units daily. At this check a level on 12/16/2012 and it was low. Told her to increase to 4000 units daily. She says that she forgot about making this change but will go home to start taking the 4000 units daily now.  7. Pulmonary hypertension  8. COPD (chronic obstructive pulmonary disease) Quit smoking 4 years ago.  9. H/O Normocytic anemia   10. Breast cancer screening Visit she has not had a mammogram in years. She is agreeable to go have one. - MM Digital Screening; Future  11. Screening for colorectal cancer She states that the last colonoscopy was less than 10 years ago. At that time she was told she can repeat 10 years. Not due yet.  12. Immunizations: Pneumonia vaccine: She received the Pneumovax 11/30/09.  Given the new guidelines she needs to receive Prevnar 13. She is agreeable to get this today. Tetanus: None documented in Epic. None documented in her paper chart. Patient not cannot recall having one in the last 10 years. We'll update now. Zostavax: Told her to contact her insurance company to find out her cost for this. If she wishes to proceed , she can call us and we can send an order in to the pharmacy for her to go there for the injection. Melinda vaccine: Was given here a 12/16/12.   Immunization due - Pneumococcal conjugate vaccine 13-valent - Tdap vaccine greater than or equal to 7yo IM  On her AVS, I wrote down "her homework": Start The simvastatin. stop clonidine patch and change over to the new prescription pill. Increase the vitamin D to  4000. Make sure she follows up with her eye exam. Mammogram. Call about the cost of Zostavax.  ROV months, sooner if needed.   Signed, [address] [place], [place], [facility] 12/25/2012 1:17 PM

## 2012-12-26 ENCOUNTER — Other Ambulatory Visit: Payer: Self-pay | Admitting: Physician Assistant

## 2012-12-26 ENCOUNTER — Inpatient Hospital Stay
Admission: RE | Admit: 2012-12-26 | Discharge: 2012-12-26 | Disposition: A | Discharge status: Home / Self Care | Payer: Medicare Other | Source: Ambulatory Visit | Attending: General Surgery | Admitting: General Surgery

## 2012-12-26 DIAGNOSIS — Z1231 Encounter for screening mammogram for malignant neoplasm of breast: Secondary | ICD-10-CM

## 2012-12-31 ENCOUNTER — Ambulatory Visit
Admission: RE | Admit: 2012-12-31 | Discharge: 2012-12-31 | Disposition: A | Discharge status: Home / Self Care | Payer: Medicare Other | Source: Ambulatory Visit | Attending: General Surgery | Admitting: General Surgery

## 2012-12-31 ENCOUNTER — Encounter: Payer: Self-pay | Admitting: *Deleted

## 2012-12-31 DIAGNOSIS — K3533 Acute appendicitis with perforation and localized peritonitis, with abscess: Secondary | ICD-10-CM

## 2013-01-09 ENCOUNTER — Ambulatory Visit (INDEPENDENT_AMBULATORY_CARE_PROVIDER_SITE_OTHER): Payer: Medicare Other | Admitting: Adult Health

## 2013-01-09 ENCOUNTER — Encounter: Payer: Self-pay | Admitting: Adult Health

## 2013-01-09 VITALS — BP 142/58 | HR 72 | Ht 63.0 in | Wt 154.0 lb

## 2013-01-09 DIAGNOSIS — I5031 Acute diastolic (congestive) heart failure: Secondary | ICD-10-CM

## 2013-01-09 DIAGNOSIS — I10 Essential (primary) hypertension: Secondary | ICD-10-CM

## 2013-01-09 DIAGNOSIS — R0602 Shortness of breath: Secondary | ICD-10-CM

## 2013-01-09 NOTE — Assessment & Plan Note (Signed)
Excellent control of BP on this visit. She will continue current regimen. She is followed in [facility] for labs, due next week. See her again in 6 months to be established with cardiologist as RR has retired.

## 2013-01-09 NOTE — Progress Notes (Signed)
HPI: [name] is a 72 year old former patient of Dr. Lattie Haw we are following for ongoing assessment and management of diastolic CHF and difficult to control hypertension. The patient's last in the office in June of 2014. At that time the patient was medically compliant with a control blood pressure. She has a history of questionable right renal artery stenosis per ultrasound, in May of 2014, although this is inconclusive due to technique. She had been recently started on clonidine patches 0.2 mg weekly which was increased from 0.1 mg patches daily. She is also being followed by [facility].   BP is now very well controlled. She is medically compliant and is taking BP daily. [facility] continues to follow up. Greatly appreciate their efforts.     Allergies  Allergen Reactions  . Aspirin   . Metformin And Related Diarrhea    Current Outpatient Prescriptions  Medication Sig Dispense Refill  . acetaminophen (TYLENOL) 500 MG tablet Take 500 mg by mouth every 6 (six) hours as needed for pain.      Marland Kitchen alendronate (FOSAMAX) 70 MG tablet Take 1 tablet (70 mg total) by mouth every 7 (seven) days. Take with a full glass of water on an empty stomach.  4 tablet  11  . amLODipine (NORVASC) 10 MG tablet TAKE ONE TABLET BY MOUTH ONCE DAILY  90 tablet  3  . cholecalciferol (VITAMIN D) 1000 UNITS tablet Take 4,000 Units by mouth daily.       . cloNIDine (CATAPRES) 0.3 MG tablet Take 1 tablet (0.3 mg total) by mouth 2 (two) times daily.  60 tablet  5  . hydrochlorothiazide (MICROZIDE) 12.5 MG capsule       . LANTUS 100 UNIT/ML injection INJECT 15 UNITS SUBCUTANEOUSLY EVERY MORNING  10 mL  1  . lisinopril (PRINIVIL,ZESTRIL) 20 MG tablet Take 20-40 mg by mouth 2 (two) times daily. TAKES TWO TABLETS IN THE MORNING AND ONE TABLET AT BEDTIME      . simvastatin (ZOCOR) 20 MG tablet Take 1 tablet (20 mg total) by mouth at bedtime.  90 tablet  0   No current facility-administered medications for this visit.    Past Medical  History  Diagnosis Date  . Diabetes mellitus   . Hypertension   . Hyperlipidemia   . Tricuspid regurgitation     Echo 05/02/06-Nml LV. Mild MR. Mod TR  . Osteoporosis   . Diastolic heart failure   . Vitamin D deficiency   . Osteoporosis   . CKD (chronic kidney disease) stage 3, GFR 30-59 ml/min 12/25/2012    Past Surgical History  Procedure Laterality Date  . Heart chamber revision      patched hole --1993  . Tubal ligation      [id number] of systems complete and found to be negative unless listed above  PHYSICAL EXAM BP 142/58  Pulse 72  Ht 5\' 3"  (1.6 m)  Wt 154 lb (69.854 kg)  BMI 27.29 kg/m2  General: Well developed, well nourished, in no acute distress Head: Eyes PERRLA, No xanthomas.   Normal cephalic and atramatic  Lungs: Some bibasilar crackles. No wheezes. Marland Kitchen Heart: HRRR S1 S2, without MRG.  Pulses are 2+ & equal.            No carotid bruit. No JVD.  No abdominal bruits. No femoral bruits. Abdomen: Bowel sounds are positive, abdomen soft and non-tender without masses or                  Hernia's  noted. Msk:  Back normal, normal gait. Normal strength and tone for age. Extremities: No clubbing, cyanosis or edema.  DP +1 Neuro: Alert and oriented X 3. Psych:  Good affect, responds appropriately :  ASSESSMENT AND PLAN

## 2013-01-09 NOTE — Patient Instructions (Signed)
Your physician wants you to follow-up in: 6 months  You will receive a reminder letter in the mail two months in advance. If you don't receive a letter, please call our office to schedule the follow-up appointment.  Your physician recommends that you continue on your current medications as directed. Please refer to the Current Medication list given to you today.  

## 2013-01-09 NOTE — Assessment & Plan Note (Signed)
No evidence of fluid retention or decompensation. Continue current treatment plan. Follow up with new cardiologist on next visit.

## 2013-01-09 NOTE — Assessment & Plan Note (Signed)
Resolved with BP control. She continues COPD symptoms but are better controlled now. Continue with [facility].

## 2013-01-09 NOTE — Progress Notes (Deleted)
Name: Laura Best    DOB: 15-Mar-1940  Age: 72 y.o.  MR#: [id number]       PCP:  [name], PA-C      Insurance: Payor: [place] / Plan: BLUE MEDICARE / Product Type: *No Product type* /   CC:    Chief Complaint  Patient presents with  . Hypertension  . Congestive Heart Failure    Diastolic    VS Filed Vitals:   01/09/13 1324  BP: 142/58  Pulse: 72  Height: 5\' 3"  (1.6 m)  Weight: 154 lb (69.854 kg)    Weights Current Weight  01/09/13 154 lb (69.854 kg)  12/25/12 152 lb (68.947 kg)  12/24/12 151 lb 12.8 oz (68.856 kg)    Blood Pressure  BP Readings from Last 3 Encounters:  01/09/13 142/58  12/25/12 180/94  12/24/12 134/78     Admit date:  (Not on file) Last encounter with RMR:  12/17/2012   Allergy Aspirin and Metformin and related  Current Outpatient Prescriptions  Medication Sig Dispense Refill  . acetaminophen (TYLENOL) 500 MG tablet Take 500 mg by mouth every 6 (six) hours as needed for pain.      Marland Kitchen alendronate (FOSAMAX) 70 MG tablet Take 1 tablet (70 mg total) by mouth every 7 (seven) days. Take with a full glass of water on an empty stomach.  4 tablet  11  . amLODipine (NORVASC) 10 MG tablet TAKE ONE TABLET BY MOUTH ONCE DAILY  90 tablet  3  . cholecalciferol (VITAMIN D) 1000 UNITS tablet Take 4,000 Units by mouth daily.       . cloNIDine (CATAPRES) 0.3 MG tablet Take 1 tablet (0.3 mg total) by mouth 2 (two) times daily.  60 tablet  5  . hydrochlorothiazide (MICROZIDE) 12.5 MG capsule       . LANTUS 100 UNIT/ML injection INJECT 15 UNITS SUBCUTANEOUSLY EVERY MORNING  10 mL  1  . lisinopril (PRINIVIL,ZESTRIL) 20 MG tablet Take 20-40 mg by mouth 2 (two) times daily. TAKES TWO TABLETS IN THE MORNING AND ONE TABLET AT BEDTIME      . simvastatin (ZOCOR) 20 MG tablet Take 1 tablet (20 mg total) by mouth at bedtime.  90 tablet  0   No current facility-administered medications for this visit.    Discontinued Meds:   There are no  discontinued medications.  Patient Active Problem List   Diagnosis Date Noted  . CKD (chronic kidney disease) stage 3, GFR 30-59 ml/min 12/25/2012  . Screening for colorectal cancer 12/25/2012  . Breast cancer screening 12/25/2012  . Vitamin D deficiency   . Osteoporosis   . Postop check 08/13/2012  . Appendiceal abscess 07/30/2012  . Fever, unspecified 07/19/2012  . Abdominal  pain, other specified site 07/19/2012  . SOB (shortness of breath) 07/19/2012  . ARF (acute renal failure) 07/19/2012  . Abscess, appendix 07/19/2012  . Acute diastolic heart failure [id number]  . Hypertensive urgency 02/02/2011  . Diabetes type 2, uncontrolled 02/02/2011  . Pulmonary hypertension 02/02/2011  . COPD (chronic obstructive pulmonary disease) 02/02/2011  . Hypokalemia 02/02/2011  . Normocytic anemia 02/02/2011  . Hypertension   . Hyperlipidemia     LABS    Component Value Date/Time   NA 138 12/16/2012 1122   NA 138 07/24/2012 0737   NA 138 07/23/2012 1438   K 4.9 12/16/2012 1122   K 4.5 07/25/2012 0021   K 5.1 07/24/2012 1632   CL 108 12/16/2012 1122  CL 107 07/24/2012 0737   CL 108 07/23/2012 1438   CO2 23 12/16/2012 1122   CO2 23 07/24/2012 0737   CO2 21 07/23/2012 1438   GLUCOSE 73 12/16/2012 1122   GLUCOSE 141* 07/24/2012 0737   GLUCOSE 157* 07/23/2012 1438   GLUCOSE 163* 07/19/2012 1005   GLUCOSE 181* 06/13/2012 1129   BUN 33* 12/16/2012 1122   BUN 12 07/24/2012 0737   BUN 17 07/23/2012 1438   CREATININE 1.54* 12/16/2012 1122   CREATININE 1.18* 07/24/2012 0737   CREATININE 1.20* 07/23/2012 1438   CREATININE 1.50* 07/22/2012 0620   CREATININE 1.21* 04/01/2012 0000   CREATININE 1.51* 03/06/2011 1130   CALCIUM 10.0 12/16/2012 1122   CALCIUM 8.4 07/24/2012 0737   CALCIUM 8.5 07/23/2012 1438   GFRNONAA 45* 07/24/2012 0737   GFRNONAA 44* 07/23/2012 1438   GFRNONAA 34* 07/22/2012 0620   GFRAA 52* 07/24/2012 0737   GFRAA 51* 07/23/2012 1438   GFRAA 39* 07/22/2012 0620   CMP     Component Value Date/Time   NA  138 12/16/2012 1122   K 4.9 12/16/2012 1122   CL 108 12/16/2012 1122   CO2 23 12/16/2012 1122   GLUCOSE 73 12/16/2012 1122   GLUCOSE 163* 07/19/2012 1005   BUN 33* 12/16/2012 1122   CREATININE 1.54* 12/16/2012 1122   CREATININE 1.18* 07/24/2012 0737   CALCIUM 10.0 12/16/2012 1122   PROT 7.8 12/16/2012 1122   ALBUMIN 4.2 12/16/2012 1122   AST 13 12/16/2012 1122   ALT 12 12/16/2012 1122   ALKPHOS 71 12/16/2012 1122   BILITOT 0.3 12/16/2012 1122   GFRNONAA 45* 07/24/2012 0737   GFRAA 52* 07/24/2012 0737       Component Value Date/Time   WBC 6.3 07/24/2012 0737   WBC 7.5 07/23/2012 1438   WBC 7.7 07/22/2012 0620   HGB 8.3* 07/24/2012 0737   HGB 8.3* 07/23/2012 1438   HGB 7.3* 07/22/2012 0620   HCT 25.9* 07/24/2012 0737   HCT 26.1* 07/23/2012 1438   HCT 23.1* 07/22/2012 0620   MCV 86.0 07/24/2012 0737   MCV 85.9 07/23/2012 1438   MCV 85.6 07/22/2012 0620    Lipid Panel     Component Value Date/Time   CHOL 205* 12/16/2012 1122   TRIG 154* 12/16/2012 1122   HDL 57 12/16/2012 1122   CHOLHDL 3.6 12/16/2012 1122   VLDL 31 12/16/2012 1122   LDLCALC 117* 12/16/2012 1122    ABG    Component Value Date/Time   TCO2 22 12/24/2008 2235     Lab Results  Component Value Date   TSH 2.117 02/02/2011   BNP (last 3 results) No results found for this basename: PROBNP,  in the last 8760 hours Cardiac Panel (last 3 results) No results found for this basename: CKTOTAL, CKMB, TROPONINI, RELINDX,  in the last 72 hours  Iron/TIBC/Ferritin    Component Value Date/Time   IRON 16* 07/21/2012 0650   TIBC 113* 07/21/2012 0650   FERRITIN 539* 07/21/2012 0650     EKG Orders placed during the hospital encounter of 07/19/12  . ED EKG  . EKG 12-LEAD  . EKG 12-LEAD  . EKG     Prior Assessment and Plan Problem List as of 01/09/2013   Hypertension   Last Assessment & Plan   07/19/2012 Office Visit Written 07/19/2012  2:46 PM by Alycia Rossetti, MD     Elevated BP, pt has white coat hypertension but also in  pain, no recent med changes    Hyperlipidemia  Last Assessment & Plan   03/13/2012 Office Visit Written 03/13/2012  2:14 PM by Lendon Colonel, NP     She is not on a statin at this time. She refuses statin therapy at this time, but is willing to try this again if the other medications do not cause stomach upset. Will restart medications a little at a time. She needs coaxing to be compliant.    Hypertensive urgency   Diabetes type 2, uncontrolled   Last Assessment & Plan   03/13/2012 Office Visit Written 03/13/2012  2:16 PM by Lendon Colonel, NP     She is advised to see PCP for reinstitution of insulin. She verbalizes understanding.    Pulmonary hypertension   COPD (chronic obstructive pulmonary disease)   Last Assessment & Plan   02/20/2011 Office Visit Written 02/20/2011 12:27 PM by Lendon Colonel, NP     Breathing status is stable. No excessive DOE.    Hypokalemia   Normocytic anemia   Acute diastolic heart failure   Last Assessment & Plan   07/01/2012 Office Visit Written 07/01/2012 12:10 PM by Lendon Colonel, NP     No evidence of decompensation on exam. No changes.    Fever, unspecified   Last Assessment & Plan   07/19/2012 Office Visit Written 07/19/2012  2:47 PM by Alycia Rossetti, MD     Patient has some type of infection brewing for white count in the office was 19,000 this is up from 16,003 days ago. Differentials include pneumonia versus diverticulitis or other abdominal infection. Send to emergency room for repeat chest x-ray CT scan IV fluids.    Abdominal  pain, other specified site   Last Assessment & Plan   07/19/2012 Office Visit Written 07/19/2012  2:47 PM by Alycia Rossetti, MD     Per above, send to ER for further work-up , CT scan    SOB (shortness of breath)   Last Assessment & Plan   07/19/2012 Office Visit Edited 07/21/2012  4:10 PM by Alycia Rossetti, MD     She has known pulmonary hypertension hypertension and COPD I  am seeing change from  previous examination possible with her fever and leukocytosis that she has an underlying pneumonia versus abdominal pathology is causing the shortness of breath. [facility]'ll be [facility] to [facility] workup    ARF (acute renal failure)   Last Assessment & Plan   07/19/2012 Office Visit Written 07/19/2012  2:47 PM by Alycia Rossetti, MD     Acute renal failure noted on labs 3 days ago this will need to be repeated to see if this has worsened    Abscess, appendix   Appendiceal abscess   Postop check   Vitamin D deficiency   Osteoporosis   CKD (chronic kidney disease) stage 3, GFR 30-59 ml/min   Screening for colorectal cancer   Breast cancer screening       Imaging: Ct Abdomen Pelvis Wo Contrast  12/31/2012   CLINICAL DATA:  History of para appendiceal abscess with placement of percutaneous drain.  EXAM: CT ABDOMEN AND PELVIS WITHOUT CONTRAST  TECHNIQUE: Multidetector CT imaging of the abdomen and pelvis was performed following the standard protocol without intravenous contrast.  COMPARISON:  CT abdomen and pelvis 08/01/2012.  FINDINGS: Heart size is upper normal. No pleural or pericardial effusion. There is some scarring in the right lung base.  Drainage catheter in the left lower quadrant seen on the prior study has been  removed. There is no fluid collection in the abdomen or pelvis. A large volume of stool is seen throughout the colon. The colon is otherwise unremarkable. The appendix has been removed. Small hiatal hernia is noted. The stomach is otherwise unremarkable. Small bowel appears normal.  The liver, gallbladder, spleen, adrenal glands, pancreas and kidneys appear normal. Extensive aortoiliac atherosclerosis without aneurysm is noted. There is no lymphadenopathy or fluid. No lytic or sclerotic bony lesion is identified.  IMPRESSION: No acute finding. No evidence of complication related to periappendiceal abscess.  Atherosclerosis.  Small hiatal hernia.   Electronically Signed    By: Inge Rise M.D.   On: 12/31/2012 16:10

## 2013-01-19 ENCOUNTER — Encounter (HOSPITAL_COMMUNITY): Payer: Self-pay | Admitting: Emergency Medicine

## 2013-01-19 ENCOUNTER — Emergency Department (HOSPITAL_COMMUNITY): Payer: Medicare Other

## 2013-01-19 ENCOUNTER — Inpatient Hospital Stay (HOSPITAL_COMMUNITY)
Admission: EM | Admit: 2013-01-19 | Discharge: 2013-01-24 | DRG: 280 | Disposition: A | Discharge status: Home Health | Payer: Medicare Other | Attending: Internal Medicine | Admitting: Internal Medicine

## 2013-01-19 DIAGNOSIS — Z87891 Personal history of nicotine dependence: Secondary | ICD-10-CM

## 2013-01-19 DIAGNOSIS — Z09 Encounter for follow-up examination after completed treatment for conditions other than malignant neoplasm: Secondary | ICD-10-CM

## 2013-01-19 DIAGNOSIS — N183 Chronic kidney disease, stage 3 unspecified: Secondary | ICD-10-CM | POA: Diagnosis present

## 2013-01-19 DIAGNOSIS — I214 Non-ST elevation (NSTEMI) myocardial infarction: Secondary | ICD-10-CM

## 2013-01-19 DIAGNOSIS — IMO0001 Reserved for inherently not codable concepts without codable children: Secondary | ICD-10-CM | POA: Diagnosis present

## 2013-01-19 DIAGNOSIS — N289 Disorder of kidney and ureter, unspecified: Secondary | ICD-10-CM

## 2013-01-19 DIAGNOSIS — R509 Fever, unspecified: Secondary | ICD-10-CM

## 2013-01-19 DIAGNOSIS — Z9851 Tubal ligation status: Secondary | ICD-10-CM

## 2013-01-19 DIAGNOSIS — I2510 Atherosclerotic heart disease of native coronary artery without angina pectoris: Secondary | ICD-10-CM | POA: Diagnosis present

## 2013-01-19 DIAGNOSIS — Z7982 Long term (current) use of aspirin: Secondary | ICD-10-CM

## 2013-01-19 DIAGNOSIS — I272 Pulmonary hypertension, unspecified: Secondary | ICD-10-CM

## 2013-01-19 DIAGNOSIS — J9600 Acute respiratory failure, unspecified whether with hypoxia or hypercapnia: Secondary | ICD-10-CM | POA: Diagnosis present

## 2013-01-19 DIAGNOSIS — J441 Chronic obstructive pulmonary disease with (acute) exacerbation: Secondary | ICD-10-CM | POA: Diagnosis present

## 2013-01-19 DIAGNOSIS — IMO0002 Reserved for concepts with insufficient information to code with codable children: Secondary | ICD-10-CM

## 2013-01-19 DIAGNOSIS — D649 Anemia, unspecified: Secondary | ICD-10-CM

## 2013-01-19 DIAGNOSIS — I5033 Acute on chronic diastolic (congestive) heart failure: Principal | ICD-10-CM | POA: Diagnosis present

## 2013-01-19 DIAGNOSIS — E559 Vitamin D deficiency, unspecified: Secondary | ICD-10-CM

## 2013-01-19 DIAGNOSIS — E1121 Type 2 diabetes mellitus with diabetic nephropathy: Secondary | ICD-10-CM | POA: Diagnosis present

## 2013-01-19 DIAGNOSIS — M810 Age-related osteoporosis without current pathological fracture: Secondary | ICD-10-CM

## 2013-01-19 DIAGNOSIS — I2789 Other specified pulmonary heart diseases: Secondary | ICD-10-CM | POA: Diagnosis present

## 2013-01-19 DIAGNOSIS — Z888 Allergy status to other drugs, medicaments and biological substances status: Secondary | ICD-10-CM

## 2013-01-19 DIAGNOSIS — N179 Acute kidney failure, unspecified: Secondary | ICD-10-CM | POA: Diagnosis not present

## 2013-01-19 DIAGNOSIS — Z1239 Encounter for other screening for malignant neoplasm of breast: Secondary | ICD-10-CM

## 2013-01-19 DIAGNOSIS — I5031 Acute diastolic (congestive) heart failure: Secondary | ICD-10-CM

## 2013-01-19 DIAGNOSIS — I079 Rheumatic tricuspid valve disease, unspecified: Secondary | ICD-10-CM | POA: Diagnosis present

## 2013-01-19 DIAGNOSIS — Z794 Long term (current) use of insulin: Secondary | ICD-10-CM

## 2013-01-19 DIAGNOSIS — R9431 Abnormal electrocardiogram [ECG] [EKG]: Secondary | ICD-10-CM

## 2013-01-19 DIAGNOSIS — R109 Unspecified abdominal pain: Secondary | ICD-10-CM

## 2013-01-19 DIAGNOSIS — R0602 Shortness of breath: Secondary | ICD-10-CM

## 2013-01-19 DIAGNOSIS — J440 Chronic obstructive pulmonary disease with acute lower respiratory infection: Secondary | ICD-10-CM | POA: Diagnosis present

## 2013-01-19 DIAGNOSIS — E876 Hypokalemia: Secondary | ICD-10-CM

## 2013-01-19 DIAGNOSIS — E1165 Type 2 diabetes mellitus with hyperglycemia: Secondary | ICD-10-CM

## 2013-01-19 DIAGNOSIS — J449 Chronic obstructive pulmonary disease, unspecified: Secondary | ICD-10-CM

## 2013-01-19 DIAGNOSIS — Z951 Presence of aortocoronary bypass graft: Secondary | ICD-10-CM

## 2013-01-19 DIAGNOSIS — E875 Hyperkalemia: Secondary | ICD-10-CM | POA: Diagnosis not present

## 2013-01-19 DIAGNOSIS — J209 Acute bronchitis, unspecified: Secondary | ICD-10-CM | POA: Diagnosis present

## 2013-01-19 DIAGNOSIS — I509 Heart failure, unspecified: Secondary | ICD-10-CM

## 2013-01-19 DIAGNOSIS — I129 Hypertensive chronic kidney disease with stage 1 through stage 4 chronic kidney disease, or unspecified chronic kidney disease: Secondary | ICD-10-CM | POA: Diagnosis present

## 2013-01-19 DIAGNOSIS — I10 Essential (primary) hypertension: Secondary | ICD-10-CM

## 2013-01-19 DIAGNOSIS — N393 Stress incontinence (female) (male): Secondary | ICD-10-CM | POA: Diagnosis present

## 2013-01-19 DIAGNOSIS — Z833 Family history of diabetes mellitus: Secondary | ICD-10-CM

## 2013-01-19 DIAGNOSIS — E785 Hyperlipidemia, unspecified: Secondary | ICD-10-CM

## 2013-01-19 DIAGNOSIS — Z79899 Other long term (current) drug therapy: Secondary | ICD-10-CM

## 2013-01-19 DIAGNOSIS — D638 Anemia in other chronic diseases classified elsewhere: Secondary | ICD-10-CM | POA: Diagnosis present

## 2013-01-19 DIAGNOSIS — K3533 Acute appendicitis with perforation and localized peritonitis, with abscess: Secondary | ICD-10-CM

## 2013-01-19 DIAGNOSIS — Z1211 Encounter for screening for malignant neoplasm of colon: Secondary | ICD-10-CM

## 2013-01-19 LAB — CBC WITH DIFFERENTIAL/PLATELET
Basophils Absolute: 0 10*3/uL (ref 0.0–0.1)
Basophils Relative: 0 % (ref 0–1)
HCT: 27.6 % — ABNORMAL LOW (ref 36.0–46.0)
Hemoglobin: 9 g/dL — ABNORMAL LOW (ref 12.0–15.0)
Lymphocytes Relative: 8 % — ABNORMAL LOW (ref 12–46)
Lymphs Abs: 0.7 10*3/uL (ref 0.7–4.0)
MCH: 28.2 pg (ref 26.0–34.0)
MCHC: 32.6 g/dL (ref 30.0–36.0)
Monocytes Absolute: 0.7 10*3/uL (ref 0.1–1.0)
Monocytes Relative: 8 % (ref 3–12)
Neutro Abs: 7.6 10*3/uL (ref 1.7–7.7)
Neutrophils Relative %: 83 % — ABNORMAL HIGH (ref 43–77)
RBC: 3.19 MIL/uL — ABNORMAL LOW (ref 3.87–5.11)
WBC: 9.1 10*3/uL (ref 4.0–10.5)

## 2013-01-19 LAB — TROPONIN I: Troponin I: 2.37 ng/mL (ref <0.30)

## 2013-01-19 LAB — PHOSPHORUS: Phosphorus: 4.1 mg/dL (ref 2.3–4.6)

## 2013-01-19 LAB — BASIC METABOLIC PANEL
BUN: 22 mg/dL (ref 6–23)
CO2: 24 mEq/L (ref 19–32)
Chloride: 101 mEq/L (ref 96–112)
GFR calc Af Amer: 41 mL/min — ABNORMAL LOW (ref >90)
GFR calc non Af Amer: 35 mL/min — ABNORMAL LOW (ref >90)
Potassium: 3.8 mEq/L (ref 3.5–5.1)

## 2013-01-19 LAB — IRON AND TIBC
Iron: 25 ug/dL — ABNORMAL LOW (ref 42–135)
Saturation Ratios: 11 % — ABNORMAL LOW (ref 20–55)
TIBC: 233 ug/dL — ABNORMAL LOW (ref 250–470)
UIBC: 208 ug/dL (ref 125–400)

## 2013-01-19 LAB — RETICULOCYTES: RBC.: 3.16 MIL/uL — ABNORMAL LOW (ref 3.87–5.11)

## 2013-01-19 LAB — GLUCOSE, CAPILLARY
Glucose-Capillary: 227 mg/dL — ABNORMAL HIGH (ref 70–99)
Glucose-Capillary: 183 mg/dL — ABNORMAL HIGH (ref 70–99)

## 2013-01-19 LAB — VITAMIN B12: Vitamin B-12: 383 pg/mL (ref 211–911)

## 2013-01-19 LAB — FOLATE: Folate: >20 ng/mL

## 2013-01-19 MED ORDER — ALBUTEROL SULFATE (5 MG/ML) 0.5% IN NEBU
INHALATION_SOLUTION | RESPIRATORY_TRACT | Status: AC
  Administered 2013-01-19: 2.5 mg
  Filled 2013-01-19: qty 0.5

## 2013-01-19 MED ORDER — HEPARIN BOLUS VIA INFUSION
4000.0000 [IU] | Freq: Once | INTRAVENOUS | Status: DC
  Filled 2013-01-19: qty 4000

## 2013-01-19 MED ORDER — INSULIN ASPART 100 UNIT/ML ~~LOC~~ SOLN
6.0000 [IU] | Freq: Once | SUBCUTANEOUS | Status: AC
  Administered 2013-01-19: 6 [IU] via SUBCUTANEOUS

## 2013-01-19 MED ORDER — FUROSEMIDE 10 MG/ML IJ SOLN
40.0000 mg | Freq: Two times a day (BID) | INTRAMUSCULAR | Status: DC
  Administered 2013-01-19 – 2013-01-20 (×3): 40 mg via INTRAVENOUS
  Filled 2013-01-19 (×6): qty 4

## 2013-01-19 MED ORDER — INSULIN ASPART 100 UNIT/ML ~~LOC~~ SOLN: 0.0000 [IU] | Freq: Three times a day (TID) | SUBCUTANEOUS | Status: DC

## 2013-01-19 MED ORDER — ACETAMINOPHEN 650 MG RE SUPP: 650.0000 mg | Freq: Four times a day (QID) | RECTAL | Status: DC | PRN

## 2013-01-19 MED ORDER — AZITHROMYCIN 250 MG PO TABS
500.0000 mg | ORAL_TABLET | Freq: Every day | ORAL | Status: AC
  Administered 2013-01-19: 500 mg via ORAL
  Filled 2013-01-19: qty 2

## 2013-01-19 MED ORDER — IPRATROPIUM BROMIDE 0.02 % IN SOLN
0.5000 mg | Freq: Once | RESPIRATORY_TRACT | Status: AC
  Administered 2013-01-19: 0.5 mg via RESPIRATORY_TRACT
  Filled 2013-01-19: qty 2.5

## 2013-01-19 MED ORDER — FUROSEMIDE 10 MG/ML IJ SOLN
20.0000 mg | Freq: Four times a day (QID) | INTRAMUSCULAR | Status: DC
  Administered 2013-01-19: 20 mg via INTRAVENOUS
  Filled 2013-01-19: qty 2

## 2013-01-19 MED ORDER — ALENDRONATE SODIUM 70 MG PO TABS: 70.0000 mg | ORAL_TABLET | ORAL | Status: DC

## 2013-01-19 MED ORDER — FUROSEMIDE 10 MG/ML IJ SOLN
40.0000 mg | Freq: Once | INTRAMUSCULAR | Status: AC
  Administered 2013-01-19: 40 mg via INTRAVENOUS
  Filled 2013-01-19: qty 4

## 2013-01-19 MED ORDER — AMLODIPINE BESYLATE 10 MG PO TABS
10.0000 mg | ORAL_TABLET | Freq: Every day | ORAL | Status: DC
  Administered 2013-01-19 – 2013-01-24 (×6): 10 mg via ORAL
  Filled 2013-01-19 (×5): qty 1
  Filled 2013-01-19: qty 2

## 2013-01-19 MED ORDER — METHYLPREDNISOLONE SODIUM SUCC 40 MG IJ SOLR
40.0000 mg | Freq: Four times a day (QID) | INTRAMUSCULAR | Status: DC
  Administered 2013-01-19 – 2013-01-20 (×4): 40 mg via INTRAVENOUS
  Filled 2013-01-19 (×8): qty 1

## 2013-01-19 MED ORDER — LISINOPRIL 20 MG PO TABS
20.0000 mg | ORAL_TABLET | Freq: Every day | ORAL | Status: DC
  Administered 2013-01-19: 20 mg via ORAL
  Filled 2013-01-19: qty 2

## 2013-01-19 MED ORDER — AZITHROMYCIN 250 MG PO TABS
250.0000 mg | ORAL_TABLET | Freq: Every day | ORAL | Status: DC
  Administered 2013-01-20: 250 mg via ORAL
  Filled 2013-01-19 (×2): qty 1

## 2013-01-19 MED ORDER — POLYETHYLENE GLYCOL 3350 17 G PO PACK
17.0000 g | PACK | Freq: Every day | ORAL | Status: DC | PRN
  Filled 2013-01-19: qty 1

## 2013-01-19 MED ORDER — IPRATROPIUM BROMIDE 0.02 % IN SOLN
0.5000 mg | Freq: Four times a day (QID) | RESPIRATORY_TRACT | Status: DC
  Administered 2013-01-19 – 2013-01-20 (×3): 0.5 mg via RESPIRATORY_TRACT
  Filled 2013-01-19 (×3): qty 2.5

## 2013-01-19 MED ORDER — INSULIN ASPART 100 UNIT/ML ~~LOC~~ SOLN: 0.0000 [IU] | Freq: Every day | SUBCUTANEOUS | Status: DC

## 2013-01-19 MED ORDER — VITAMIN D3 25 MCG (1000 UNIT) PO TABS
4000.0000 [IU] | ORAL_TABLET | Freq: Every day | ORAL | Status: DC
  Administered 2013-01-19 – 2013-01-24 (×6): 4000 [IU] via ORAL
  Filled 2013-01-19 (×6): qty 4

## 2013-01-19 MED ORDER — PANTOPRAZOLE SODIUM 40 MG PO TBEC
40.0000 mg | DELAYED_RELEASE_TABLET | Freq: Every day | ORAL | Status: DC
  Administered 2013-01-19 – 2013-01-24 (×6): 40 mg via ORAL
  Filled 2013-01-19 (×5): qty 1

## 2013-01-19 MED ORDER — INSULIN GLARGINE 100 UNIT/ML ~~LOC~~ SOLN
10.0000 [IU] | Freq: Every day | SUBCUTANEOUS | Status: DC
Start: 2013-01-20 — End: 2013-01-20
  Administered 2013-01-20: 10 [IU] via SUBCUTANEOUS
  Filled 2013-01-19 (×2): qty 0.1

## 2013-01-19 MED ORDER — ENOXAPARIN SODIUM 40 MG/0.4ML ~~LOC~~ SOLN
40.0000 mg | SUBCUTANEOUS | Status: DC
  Administered 2013-01-19: 40 mg via SUBCUTANEOUS
  Filled 2013-01-19: qty 0.4

## 2013-01-19 MED ORDER — ONDANSETRON HCL 4 MG/2ML IJ SOLN
4.0000 mg | Freq: Four times a day (QID) | INTRAMUSCULAR | Status: DC | PRN
  Administered 2013-01-19: 4 mg via INTRAVENOUS
  Filled 2013-01-19: qty 2

## 2013-01-19 MED ORDER — SODIUM CHLORIDE 0.9 % IJ SOLN
3.0000 mL | Freq: Two times a day (BID) | INTRAMUSCULAR | Status: DC
  Administered 2013-01-19 – 2013-01-24 (×9): 3 mL via INTRAVENOUS

## 2013-01-19 MED ORDER — INSULIN GLARGINE 100 UNIT/ML ~~LOC~~ SOLN
15.0000 [IU] | Freq: Every day | SUBCUTANEOUS | Status: DC
  Administered 2013-01-19: 10 [IU] via SUBCUTANEOUS
  Filled 2013-01-19 (×2): qty 0.15

## 2013-01-19 MED ORDER — LISINOPRIL 10 MG PO TABS: 20.0000 mg | ORAL_TABLET | Freq: Two times a day (BID) | ORAL | Status: DC

## 2013-01-19 MED ORDER — CLONIDINE HCL 0.3 MG PO TABS
0.3000 mg | ORAL_TABLET | Freq: Two times a day (BID) | ORAL | Status: DC
  Administered 2013-01-19 – 2013-01-21 (×5): 0.3 mg via ORAL
  Filled 2013-01-19 (×8): qty 1

## 2013-01-19 MED ORDER — INSULIN ASPART 100 UNIT/ML ~~LOC~~ SOLN
0.0000 [IU] | Freq: Three times a day (TID) | SUBCUTANEOUS | Status: DC
  Administered 2013-01-19: 5 [IU] via SUBCUTANEOUS
  Administered 2013-01-19: 3 [IU] via SUBCUTANEOUS

## 2013-01-19 MED ORDER — INSULIN ASPART 100 UNIT/ML ~~LOC~~ SOLN: 4.0000 [IU] | Freq: Once | SUBCUTANEOUS | Status: DC

## 2013-01-19 MED ORDER — ALBUTEROL SULFATE (5 MG/ML) 0.5% IN NEBU
2.5000 mg | INHALATION_SOLUTION | Freq: Four times a day (QID) | RESPIRATORY_TRACT | Status: DC
  Administered 2013-01-19 – 2013-01-20 (×3): 2.5 mg via RESPIRATORY_TRACT
  Filled 2013-01-19 (×3): qty 0.5

## 2013-01-19 MED ORDER — DEXTROSE 5 % IV SOLN
1.0000 g | INTRAVENOUS | Status: DC
  Administered 2013-01-19: 1 g via INTRAVENOUS
  Filled 2013-01-19 (×2): qty 10

## 2013-01-19 MED ORDER — HEPARIN (PORCINE) IN NACL 100-0.45 UNIT/ML-% IJ SOLN
950.0000 [IU]/h | INTRAMUSCULAR | Status: DC
  Administered 2013-01-19: 800 [IU]/h via INTRAVENOUS
  Administered 2013-01-19: 4000 [IU]/h via INTRAVENOUS
  Administered 2013-01-20 (×2): 950 [IU]/h via INTRAVENOUS
  Filled 2013-01-19 (×3): qty 250

## 2013-01-19 MED ORDER — SIMVASTATIN 20 MG PO TABS
20.0000 mg | ORAL_TABLET | Freq: Every day | ORAL | Status: DC
  Administered 2013-01-19 – 2013-01-23 (×5): 20 mg via ORAL
  Filled 2013-01-19 (×6): qty 1

## 2013-01-19 MED ORDER — PROMETHAZINE HCL 25 MG/ML IJ SOLN
12.5000 mg | Freq: Four times a day (QID) | INTRAMUSCULAR | Status: DC | PRN
  Administered 2013-01-19: 12.5 mg via INTRAVENOUS
  Filled 2013-01-19: qty 1

## 2013-01-19 MED ORDER — ALBUTEROL SULFATE (2.5 MG/3ML) 0.083% IN NEBU
2.5000 mg | INHALATION_SOLUTION | RESPIRATORY_TRACT | Status: DC | PRN
  Filled 2013-01-19: qty 3

## 2013-01-19 MED ORDER — BISACODYL 10 MG RE SUPP
10.0000 mg | Freq: Every day | RECTAL | Status: DC | PRN
  Administered 2013-01-20: 10 mg via RECTAL
  Filled 2013-01-19: qty 1

## 2013-01-19 MED ORDER — IPRATROPIUM BROMIDE 0.02 % IN SOLN
RESPIRATORY_TRACT | Status: AC
  Administered 2013-01-19: 0.5 mg
  Filled 2013-01-19: qty 2.5

## 2013-01-19 MED ORDER — ASPIRIN EC 325 MG PO TBEC
325.0000 mg | DELAYED_RELEASE_TABLET | Freq: Every day | ORAL | Status: DC
  Administered 2013-01-19: 325 mg via ORAL
  Filled 2013-01-19: qty 1

## 2013-01-19 MED ORDER — LISINOPRIL 40 MG PO TABS
40.0000 mg | ORAL_TABLET | Freq: Every day | ORAL | Status: DC
  Administered 2013-01-19: 40 mg via ORAL
  Filled 2013-01-19: qty 4

## 2013-01-19 MED ORDER — ALBUTEROL SULFATE (5 MG/ML) 0.5% IN NEBU
2.5000 mg | INHALATION_SOLUTION | Freq: Once | RESPIRATORY_TRACT | Status: AC
  Administered 2013-01-19: 2.5 mg via RESPIRATORY_TRACT
  Filled 2013-01-19: qty 0.5

## 2013-01-19 NOTE — Progress Notes (Signed)
ANTICOAGULATION CONSULT NOTE - Initial Consult  Pharmacy Consult for Heparin Indication: chest pain/ACS  Allergies  Allergen Reactions  . Aspirin Other (See Comments)    G.I. Upset.   . Metformin And Related Diarrhea    Patient Measurements: Height: 5\' 3"  (160 cm) Weight: 149 lb 14.4 oz (67.994 kg) IBW/kg (Calculated) : 52.4 Heparin Dosing Weight: 68 kg  Vital Signs: Temp: 97.9 F (36.6 C) (12/28 2005) Temp src: Oral (12/28 2005) BP: 125/61 mmHg (12/28 2005) Pulse Rate: 64 (12/28 2005)  Labs:  Recent Labs  01/19/13 0157 01/19/13 1835  HGB 9.0*  --   HCT 27.6*  --   PLT 273  --   CREATININE 1.45*  --   TROPONINI <0.30 2.37*    Estimated Creatinine Clearance: 32.4 ml/min (by C-G formula based on Cr of 1.45).   Medical History: Past Medical History  Diagnosis Date  . Diabetes mellitus   . Hypertension   . Hyperlipidemia   . Tricuspid regurgitation     Echo 05/02/06-Nml LV. Mild MR. Mod TR  . Osteoporosis   . Diastolic heart failure   . Vitamin D deficiency   . Osteoporosis   . CKD (chronic kidney disease) stage 3, GFR 30-59 ml/min 12/25/2012    Medications:  Scheduled:  . ipratropium  0.5 mg Nebulization Q6H   And  . albuterol  2.5 mg Nebulization Q6H  . amLODipine  10 mg Oral Daily  . aspirin EC  325 mg Oral Daily  . [START ON 01/20/2013] azithromycin  250 mg Oral Daily  . cefTRIAXone (ROCEPHIN)  IV  1 g Intravenous Q24H  . cholecalciferol  4,000 Units Oral Daily  . cloNIDine  0.3 mg Oral BID  . furosemide  40 mg Intravenous Q12H  . heparin  4,000 Units Intravenous Once  . insulin aspart  0-15 Units Subcutaneous TID WC  . [START ON 01/20/2013] insulin glargine  10 Units Subcutaneous Daily  . lisinopril  40 mg Oral Daily   And  . lisinopril  20 mg Oral QHS  . methylPREDNISolone (SOLU-MEDROL) injection  40 mg Intravenous Q6H  . pantoprazole  40 mg Oral Daily  . simvastatin  20 mg Oral QHS  . sodium chloride  3 mL Intravenous Q12H     Assessment: Troponin 2.3 Heparin protocol  Goal of Therapy:  Heparin level 0.3-0.7 units/ml Monitor platelets by anticoagulation protocol: Yes   Plan:  Give 4000 units bolus x 1 Start heparin infusion at 800 units/hr Check anti-Xa level in 8 hours and daily while on heparin Continue to monitor H&H and platelets  Abner Greenspan, Alyra Patty Bennett 01/19/2013,9:26 PM

## 2013-01-19 NOTE — Progress Notes (Signed)
Md ordered strict I&Os as pt is here with CHF exacerbation and is on IV diuretics. Pt is incontinent at times and has stress incontinence as well. I explained this to MD and he verbally stated to insert foley catheter if pt is agreeable. I spoke to the pt about inserting a foley and she is hesitant at this time. If she continues to have a lot of incontinence will speak to her again so that we can be more accurate when recording her I&O's and to decrease risk of skin break down. Will continue to monitor.

## 2013-01-19 NOTE — Progress Notes (Signed)
Utilization review Completed Chukwuma Straus RN BSN   

## 2013-01-19 NOTE — Progress Notes (Signed)
Report given to Pecolia Ades on [facility].    Granville Lewis, Barth Kirks R 01/19/2013

## 2013-01-19 NOTE — Progress Notes (Signed)
CRITICAL VALUE ALERT  Critical value received:  Troponin 2.37  Date of notification:  01/19/2013   Time of notification:  19:55  Critical value read back:yes  Nurse who received alert:  Jovita Kussmaul  MD notified (1st page):  Ghimire  Time of first page:  20:00  MD notified (2nd page):  Time of second page:  Responding MD:  Sloan Leiter  Time MD responded:  20:10

## 2013-01-19 NOTE — ED Provider Notes (Signed)
CSN: [id number]     Arrival date & time 01/19/13  0009 History   First MD Initiated Contact with Patient 01/19/13 0049     Chief Complaint  Patient presents with  . Chest Pain   (Consider location/radiation/quality/duration/timing/severity/associated sxs/prior Treatment) Patient is a 72 y.o. female presenting with chest pain. The history is provided by the patient.  Chest Pain She has a history congestive heart failure. She relates that for the last 2 days, she has felt congested in her chest and had a cough. Call was initially productive of greenish sputum but sputum has become quite. There is some associated left inframammary chest pain and some dyspnea. There is no associated nausea or vomiting. She has not had any fever or sweats but has had some occasional mild chills. She's complaining of dyspnea which is worse when she lays flat and worse when she exerts herself. She is taking some over-the-counter cough medication with no relief. She denies any sick contacts.   Past Medical History  Diagnosis Date  . Diabetes mellitus   . Hypertension   . Hyperlipidemia   . Tricuspid regurgitation     Echo 05/02/06-Nml LV. Mild MR. Mod TR  . Osteoporosis   . Diastolic heart failure   . Vitamin D deficiency   . Osteoporosis   . CKD (chronic kidney disease) stage 3, GFR 30-59 ml/min 12/25/2012   Past Surgical History  Procedure Laterality Date  . Heart chamber revision      patched hole --1993  . Tubal ligation     Family History  Problem Relation Age of Onset  . Cancer Father   . Diabetes Father   . Cancer Brother     Brain  . Cancer Sister     abdominal fat   History  Substance Use Topics  . Smoking status: Former Research scientist (life sciences)  . Smokeless tobacco: Not on file  . Alcohol Use: No   OB History   Grav Para Term Preterm Abortions TAB SAB Ect Mult Living                 Review of Systems  Cardiovascular: Positive for chest pain.  All other systems reviewed and are  negative.    Allergies  Aspirin and Metformin and related  Home Medications   Current Outpatient Rx  Name  Route  Sig  Dispense  Refill  . acetaminophen (TYLENOL) 500 MG tablet   Oral   Take 500 mg by mouth every 6 (six) hours as needed for pain.         Marland Kitchen alendronate (FOSAMAX) 70 MG tablet   Oral   Take 1 tablet (70 mg total) by mouth every 7 (seven) days. Take with a full glass of water on an empty stomach.   4 tablet   11   . amLODipine (NORVASC) 10 MG tablet      TAKE ONE TABLET BY MOUTH ONCE DAILY   90 tablet   3   . cholecalciferol (VITAMIN D) 1000 UNITS tablet   Oral   Take 4,000 Units by mouth daily.          . cloNIDine (CATAPRES) 0.3 MG tablet   Oral   Take 1 tablet (0.3 mg total) by mouth 2 (two) times daily.   60 tablet   5   . LANTUS 100 UNIT/ML injection      INJECT 15 UNITS SUBCUTANEOUSLY EVERY MORNING   10 mL   1   . lisinopril (PRINIVIL,ZESTRIL) 20 MG tablet  Oral   Take 20-40 mg by mouth 2 (two) times daily. TAKES TWO TABLETS IN THE MORNING AND ONE TABLET AT BEDTIME         . simvastatin (ZOCOR) 20 MG tablet   Oral   Take 1 tablet (20 mg total) by mouth at bedtime.   90 tablet   0    Pulse 86  Temp(Src) 99.4 F (37.4 C) (Oral)  Resp 24  Ht 5\' 3"  (1.6 m)  Wt 154 lb (69.854 kg)  BMI 27.29 kg/m2  SpO2 96% Physical Exam  Nursing note and vitals reviewed.  72 year old female, who is mildly dyspneic, but his in no acute distress. She needs to stop for breath after every 3-4 words. Vital signs are significant for tachypnea with respiratory rate of 24. Oxygen saturation is 96%, which is normal. Head is normocephalic and atraumatic. PERRLA, EOMI. Oropharynx is clear. Neck is nontender and supple without adenopathy or JVD. Back is nontender and there is no CVA tenderness. Lungs have diffuse expiratory wheezes and rhonchi without rales. Chest is nontender. Heart has regular rate and rhythm without murmur. Abdomen is soft, flat,  nontender without masses or hepatosplenomegaly and peristalsis is normoactive. Extremities have no cyanosis or edema, full range of motion is present. Skin is warm and dry without rash. Neurologic: Mental status is normal, cranial nerves are intact, there are no motor or sensory deficits.  ED Course  Procedures (including critical care time) Labs Review Results for orders placed during the hospital encounter of 01/19/13  CBC WITH DIFFERENTIAL      Result Value Range   WBC 9.1  4.0 - 10.5 K/uL   RBC 3.19 (*) 3.87 - 5.11 MIL/uL   Hemoglobin 9.0 (*) 12.0 - 15.0 g/dL   HCT 27.6 (*) 36.0 - 46.0 %   MCV 86.5  78.0 - 100.0 fL   MCH 28.2  26.0 - 34.0 pg   MCHC 32.6  30.0 - 36.0 g/dL   RDW 13.8  11.5 - 15.5 %   Platelets 273  150 - 400 K/uL   Neutrophils Relative % 83 (*) 43 - 77 %   Neutro Abs 7.6  1.7 - 7.7 K/uL   Lymphocytes Relative 8 (*) 12 - 46 %   Lymphs Abs 0.7  0.7 - 4.0 K/uL   Monocytes Relative 8  3 - 12 %   Monocytes Absolute 0.7  0.1 - 1.0 K/uL   Eosinophils Relative 1  0 - 5 %   Eosinophils Absolute 0.1  0.0 - 0.7 K/uL   Basophils Relative 0  0 - 1 %   Basophils Absolute 0.0  0.0 - 0.1 K/uL  BASIC METABOLIC PANEL      Result Value Range   Sodium 137  135 - 145 mEq/L   Potassium 3.8  3.5 - 5.1 mEq/L   Chloride 101  96 - 112 mEq/L   CO2 24  19 - 32 mEq/L   Glucose, Bld 265 (*) 70 - 99 mg/dL   BUN 22  6 - 23 mg/dL   Creatinine, Ser 1.45 (*) 0.50 - 1.10 mg/dL   Calcium 9.4  8.4 - 10.5 mg/dL   GFR calc non Af Amer 35 (*) >90 mL/min   GFR calc Af Amer 41 (*) >90 mL/min  PRO B NATRIURETIC PEPTIDE      Result Value Range   Pro B Natriuretic peptide (BNP) 10409.0 (*) 0 - 125 pg/mL  TROPONIN I      Result Value Range  Troponin I <0.30  <0.30 ng/mL   Imaging Review Dg Chest Port 1 View  01/19/2013   CLINICAL DATA:  Chest pains, shortness of breath  EXAM: PORTABLE CHEST - 1 VIEW  COMPARISON:  07/19/2012  FINDINGS: New bilateral interstitial opacities diffusely with small  effusions. Heart is enlarged. Finds compatible with CHF. Prior median sternotomy wires noted. No pneumothorax. Degenerative changes of the shoulders and spine.  IMPRESSION: Moderate CHF pattern   Electronically Signed   By: Daryll Brod M.D.   On: 01/19/2013 01:23    EKG Interpretation    Date/Time:  Sunday January 19 2013 02:16:27 EST Ventricular Rate:  86 PR Interval:  192 QRS Duration: 116 QT Interval:  424 QTC Calculation: 507 R Axis:   16 Text Interpretation:  Normal sinus rhythm Incomplete left bundle branch block ST \\T \ T wave abnormality, consider lateral ischemia Prolonged QT Abnormal ECG When compared with ECG of 07/19/2012, QT has lengthened Confirmed by Roxanne Mins  MD, Jaylin Benzel ([id number]) on 01/19/2013 2:32:34 AM            MDM   1. CHF exacerbation   2. Anemia   3. Renal insufficiency    Dyspnea which may be a combination of CHF and bronchitis/pneumonia. Chest x-ray or be obtained to rule out pneumonia and she'll be given a nebulizer treatment with albuterol and ipratropium appeared records are reviewed and she has been treated for heart failure as an outpatient but he denies any recent hospitalizations for same.  Chest x-ray has appearance most compatible with CHF. BNP and troponin are pending at this time.  Troponin has come back negative but BNP has come back at over 10,000 with baseline being between 2000 5000. This is consistent with a CHF exacerbation. She had no improvement with albuterol with ipratropium and on reexam, lungs still have coarse wheezes and rhonchi. His given a dose of furosemide. She continued to be dyspneic to where she was unable to speak in a complete sentences and she was felt to be a poor candidate to manage as an outpatient. Case has been discussed with Dr. Clydene Laming of [facility] who agrees to admit the patient.  Delora Fuel, MD [phone]

## 2013-01-19 NOTE — Progress Notes (Signed)
[facility] PROGRESS NOTE  YAKIMA LIDE [id number] DOB: 05-20-1940 DOA: 01/19/2013 PCP: Dena Billet BETH, PA-C  Assessment/Plan: 1. Acute diastolic congestive heart failure; etiology of acute decompensation unclear. Cycle cardiac enzymes, no evidence of ACS, chest discomfort present 2 days, allergic to ASA. Continue diuresis. Mg, Phos WNL 2. Suspected acute bronchitis or developing pneumonia based on symptomology; will start empiric antibiotics. Suspect this is the etiology of her left-sided chest discomfort. 3. Acute hypoxic respiratory failure: Suspect multifactorial--heart failure and bronchitis/pneumonia as above.  4. Anemia of chronic disease, appears to be at baseline  5. Diabetes mellitus type 2, stable CBG, hold metformin while hospitalized. Continue SSI and Lanuts 6. Moderate tricuspid regurgitation 7. Chronic kidney disease stage III; appears to be at baseline  8. COPD, quit smoking 4 years ago; possible acute exacerbation, add steriods 9. Hypertension   Continue IV diuresis, strict I/O, daily weights, repeat troponin  BMP, Mg in AM  Empiric antibiotics   Wean oxygen as tolerated   Add steroids, nebs  Appears moderately ill, monitor closely  Pending studies:   TSH  Hemoglobin A1c  Anemia panel  Code Status: full code DVT prophylaxis: Lovenox Family Communication: none present Disposition Plan: home when improved  Murray Hodgkins, MD  [facility]  Pager [phone] If 7PM-7AM, please contact night-coverage at [email or web address], password [email or web address] 01/19/2013, 9:51 AM  LOS: 0 days   Summary: 72 year old woman with history of diastolic congestive heart failure and COPD presented to the emergency department with increasing shortness of breath and was admitted for acute diastolic congestive heart failure and chest discomfort.  Consultants:    Procedures:    Antibiotics:    HPI/Subjective: Remains short of breath today. Some vomiting this  morning. No diarrhea. Mild abdominal discomfort.  Objective: Filed Vitals:   01/19/13 0400 01/19/13 0450 01/19/13 0545 01/19/13 0553  BP: 164/60  165/68   Pulse: 82  93 94  Temp:   98.4 F (36.9 C)   TempSrc:   Oral   Resp:   24 24  Height:  5\' 3"  (1.6 m)    Weight:  67.994 kg (149 lb 14.4 oz)    SpO2: 96%  93% 91%    Intake/Output Summary (Last 24 hours) at 01/19/13 0951 Last data filed at 01/19/13 0700  Gross per 24 hour  Intake      0 ml  Output    551 ml  Net   -551 ml     Filed Weights   01/19/13 0010 01/19/13 0450  Weight: 69.854 kg (154 lb) 67.994 kg (149 lb 14.4 oz)    Exam:   Afebrile, vital signs stable, mild hypoxia  General: Appears calm, moderately uncomfortable, nontoxic but clearly short of breath, appears moderately ill.  Cardiovascular: Regular rate and rhythm. No murmur, rub or gallop. No lower extremity edema  Telemetry: Sinus tachycardia  Respiratory: Coarse breath sounds bilaterally, rhonchi, inspiratory rales, no wheezes. Moderate increased respiratory effort. Pupils patent pulsatile masses.  Abdomen: Soft, positive bowel sounds, nondistended, mild generalized tenderness  Skin: No rash or induration seen  Psychiatric: Grossly normal mood and affect. Speech fluent and appropriate.  Data Reviewed:  [id number] recorded since admission  Capillary blood sugars stable  Basic metabolic panel unremarkable, creatinine appears to be at baseline  Phosphorus and magnesium within normal limits  Troponin negative  BNP 10,409   Chest x-ray mild CHF pattern  EKG sinus rhythm, no acute changes  Scheduled Meds: . amLODipine  10 mg Oral Daily  . cholecalciferol  4,000  Units Oral Daily  . cloNIDine  0.3 mg Oral BID  . enoxaparin (LOVENOX) injection  40 mg Subcutaneous Q24H  . furosemide  20 mg Intravenous Q6H  . insulin aspart  0-15 Units Subcutaneous TID WC  . insulin glargine  15 Units Subcutaneous Daily  . lisinopril  40 mg Oral Daily    And  . lisinopril  20 mg Oral QHS  . simvastatin  20 mg Oral QHS  . sodium chloride  3 mL Intravenous Q12H   Continuous Infusions:   Active Problems:   CHF exacerbation   Congestive heart disease   Time spent 25 minutes

## 2013-01-19 NOTE — ED Notes (Signed)
Pt c/o  Left sided chest pain, cough, and congestion x 3 days.

## 2013-01-19 NOTE — H&P (Signed)
[facility] History and Physical  Laura Best [id number] DOB: 08/28/1940 DOA: 01/19/2013  Referring physician: none PCP: [name], PA-C  Specialists: none  Chief Complaint: Short of breath  HPI: Laura Best is a 72 y.o. female  This patient was seen in emergency room today for increasing shortness of breath. Increasing shortness of breath started yesterday and got worse today. She was unable to lay down at all last night even to her usual 4 pillow orthopnea. She does have a history of diastolic congestive heart failure.  Review of Systems: The patient denies anorexia, fever, weight loss,, vision loss, decreased hearing, hoarseness, chest pain, syncope,  peripheral edema, balance deficits, hemoptysis, abdominal pain, melena, hematochezia, severe indigestion/heartburn, hematuria, incontinence, genital sores, muscle weakness, suspicious skin lesions, transient blindness, difficulty walking, depression, unusual weight change, abnormal bleeding, enlarged lymph nodes, angioedema, and breast masses.    Past Medical History  Diagnosis Date  . Diabetes mellitus   . Hypertension   . Hyperlipidemia   . Tricuspid regurgitation     Echo 05/02/06-Nml LV. Mild MR. Mod TR  . Osteoporosis   . Diastolic heart failure   . Vitamin D deficiency   . Osteoporosis   . CKD (chronic kidney disease) stage 3, GFR 30-59 ml/min 12/25/2012   Past Surgical History  Procedure Laterality Date  . Heart chamber revision      patched hole --1993  . Tubal ligation     Social History:  reports that she quit smoking about 4 years ago. She does not have any smokeless tobacco history on file. She reports that she does not drink alcohol or use illicit drugs. Patient lives in her home and is able to do her activities of daily living Allergies  Allergen Reactions  . Aspirin   . Metformin And Related Diarrhea    Family History  Problem Relation Age of Onset  . Cancer Father   . Diabetes Father    . Cancer Brother     Brain  . Cancer Sister     abdominal fat     Prior to Admission medications   Medication Sig Start Date End Date Taking? Authorizing Provider  acetaminophen (TYLENOL) 500 MG tablet Take 500 mg by mouth every 6 (six) hours as needed for pain.   Yes Historical Provider, MD  alendronate (FOSAMAX) 70 MG tablet Take 1 tablet (70 mg total) by mouth every 7 (seven) days. Take with a full glass of water on an empty stomach. 12/16/12  Yes Mary B Dixon, PA-C  amLODipine (NORVASC) 10 MG tablet TAKE ONE TABLET BY MOUTH ONCE DAILY 12/17/12  Yes Lendon Colonel, NP  cholecalciferol (VITAMIN D) 1000 UNITS tablet Take 4,000 Units by mouth daily.    Yes Historical Provider, MD  cloNIDine (CATAPRES) 0.3 MG tablet Take 1 tablet (0.3 mg total) by mouth 2 (two) times daily. 12/25/12  Yes Orlena Sheldon, PA-C  LANTUS 100 UNIT/ML injection INJECT 15 UNITS SUBCUTANEOUSLY EVERY MORNING 10/30/12  Yes Orlena Sheldon, PA-C  lisinopril (PRINIVIL,ZESTRIL) 20 MG tablet Take 20-40 mg by mouth 2 (two) times daily. TAKES TWO TABLETS IN THE MORNING AND ONE TABLET AT BEDTIME 05/14/12  Yes Lendon Colonel, NP  simvastatin (ZOCOR) 20 MG tablet Take 1 tablet (20 mg total) by mouth at bedtime. 12/20/12  Yes Orlena Sheldon, PA-C   Physical Exam: Filed Vitals:   01/19/13 0553  BP:   Pulse: 94  Temp:   Resp: 24    Nursing note and vitals  reviewed. Constitutional: She is oriented to person, place, and time. She appears well-developed and well-nourished.  HENT:    Nose: Nose normal.  Mouth/Throat: Oropharynx is clear and moist. No oropharyngeal exudate.  Eyes: Conjunctivae are normal. Pupils are equal, round, and reactive to light.  Neck: Normal range of motion. Neck supple. No thyromegaly present. ++JVD Cardiovascular: Normal rate, regular rhythm and normal heart sounds.   Pulmonary/Chest: Effort normal and breath sounds normal.  Abdominal: Soft. Bowel sounds are normal. She exhibits no distension. There  is no tenderness. There is no rebound.  Lymphadenopathy:    She has no cervical adenopathy.  Neurological: She is alert and oriented to person, place, and time. She has normal reflexes.  Skin: Skin is warm and dry.  Psychiatric: She has a normal mood and affect. Her behavior is normal.  Ext - no edema on b/l LE. Pulses strong and present   Labs on Admission:  Basic Metabolic Panel:  Recent Labs Lab 01/19/13 0157  NA 137  K 3.8  CL 101  CO2 24  GLUCOSE 265*  BUN 22  CREATININE 1.45*  CALCIUM 9.4   Liver Function Tests: No results found for this basename: AST, ALT, ALKPHOS, BILITOT, PROT, ALBUMIN,  in the last 168 hours No results found for this basename: LIPASE, AMYLASE,  in the last 168 hours No results found for this basename: AMMONIA,  in the last 168 hours CBC:  Recent Labs Lab 01/19/13 0157  WBC 9.1  NEUTROABS 7.6  HGB 9.0*  HCT 27.6*  MCV 86.5  PLT 273   Cardiac Enzymes:  Recent Labs Lab 01/19/13 0157  TROPONINI <0.30    BNP (last 3 results)  Recent Labs  01/19/13 0157  PROBNP 10409.0*   CBG: No results found for this basename: GLUCAP,  in the last 168 hours  Radiological Exams on Admission: Dg Chest Port 1 View  01/19/2013   CLINICAL DATA:  Chest pains, shortness of breath  EXAM: PORTABLE CHEST - 1 VIEW  COMPARISON:  07/19/2012  FINDINGS: New bilateral interstitial opacities diffusely with small effusions. Heart is enlarged. Finds compatible with CHF. Prior median sternotomy wires noted. No pneumothorax. Degenerative changes of the shoulders and spine.  IMPRESSION: Moderate CHF pattern   Electronically Signed   By: Daryll Brod M.D.   On: 01/19/2013 01:23      Assessment/Plan Active Problems:   CHF exacerbation   Congestive heart disease Chest pain ckd stage III Anemia Diabetes type 2 Hyperlipidemia Osteoporosis Vitamin D deficiency COPD Tricuspid regurg   1. Congestive heart failure: Symptoms and the elevated BNP are indicative  of a CHF exacerbation is likely cause of her dyspnea. We'll give her IV Lasix 20 every 6 hours. We need to be careful because she does have stage IIIc daily. Oxygen as needed. 2. Stage III chronic kidney disease: Check a BMP daily 3. Chest discomfort: Patient complained of mild chest discomfort which was worse with coughing and deep breaths. Will check an EKG and troponins to be on the safe side. 4. Anemia: Likely due to her kidney problems but will check an anemia panel 5. Diabetes type 2: A1c sliding scale we'll hold her metformin 6. Hyperlipidemia: No changes 7. Osteoporosis: Continue home meds 8. Vitamin D deficiency: Continue home 9. COPD: She did quit smoking 4 years ago. We will continue her home meds and this hospital stay.       Code Status: full Family Communication: none Disposition Plan: telemetry. D/c once diuresed  Time spent: 50  minutes  Doran Heater [facility] Pager [phone]  If 7PM-7AM, please contact night-coverage [email or web address] Password [facility] 01/19/2013, 7:58 AM

## 2013-01-19 NOTE — Consult Note (Addendum)
Reason for Consult: shortness of breath, elevated troponin Referring Physician: Dr. [name] is an 72 y.o. female.  HPI: Laura Best is a 72 yo woman with PMH of T2DM, hypertension, dyslipidemia, CAD s/p CABG, diastolic heat failure who came into [facility] yesterday with chief complaint of shortness of breath. She told me she had an accompanying cough. She had 4 pillow orthopnea and she was subsequently diuresed successfully at [facility]. She denies chest pain but may have had some chest tightness. No syncope and no current chest pain. She had an initial troponin that was negative but a repeat troponin was 2.37 leading the hospitalist team to transfer the patient to [facility] for cardiology consultation. She feels improved since coming to the hospital.    Past Medical History  Diagnosis Date  . Diabetes mellitus   . Hypertension   . Hyperlipidemia   . Tricuspid regurgitation     Echo 05/02/06-Nml LV. Mild MR. Mod TR  . Osteoporosis   . Diastolic heart failure   . Vitamin D deficiency   . Osteoporosis   . CKD (chronic kidney disease) stage 3, GFR 30-59 ml/min 12/25/2012    Past Surgical History  Procedure Laterality Date  . Heart chamber revision      patched hole --1993  . Tubal ligation      Family History  Problem Relation Age of Onset  . Cancer Father   . Diabetes Father   . Cancer Brother     Brain  . Cancer Sister     abdominal fat    Social History:  reports that she quit smoking about 4 years ago. She does not have any smokeless tobacco history on file. She reports that she does not drink alcohol or use illicit drugs.  Allergies:  Allergies  Allergen Reactions  . Aspirin Other (See Comments)    G.I. Upset.   . Metformin And Related Diarrhea    Medications:  I have reviewed the patient's current medications. Prior to Admission:  Prescriptions prior to admission  Medication Sig Dispense Refill  . acetaminophen (TYLENOL) 500 MG tablet Take 500 mg by mouth  every 6 (six) hours as needed for pain.      Marland Kitchen alendronate (FOSAMAX) 70 MG tablet Take 1 tablet (70 mg total) by mouth every 7 (seven) days. Take with a full glass of water on an empty stomach.  4 tablet  11  . amLODipine (NORVASC) 10 MG tablet Take 10 mg by mouth daily.      . cholecalciferol (VITAMIN D) 1000 UNITS tablet Take 4,000 Units by mouth daily.       . cloNIDine (CATAPRES) 0.3 MG tablet Take 1 tablet (0.3 mg total) by mouth 2 (two) times daily.  60 tablet  5  . insulin glargine (LANTUS) 100 UNIT/ML injection Inject 15 Units into the skin every morning.      Marland Kitchen lisinopril (PRINIVIL,ZESTRIL) 20 MG tablet Take 20-40 mg by mouth daily. Takes 40 mg in the morning and 20 mg in the evening.      . simvastatin (ZOCOR) 20 MG tablet Take 1 tablet (20 mg total) by mouth at bedtime.  90 tablet  0   Scheduled: . ipratropium  0.5 mg Nebulization Q6H   And  . albuterol  2.5 mg Nebulization Q6H  . amLODipine  10 mg Oral Daily  . aspirin EC  325 mg Oral Daily  . [START ON 01/20/2013] azithromycin  250 mg Oral Daily  . cefTRIAXone (ROCEPHIN)  IV  1 g Intravenous Q24H  . cholecalciferol  4,000 Units Oral Daily  . cloNIDine  0.3 mg Oral BID  . furosemide  40 mg Intravenous Q12H  . heparin  4,000 Units Intravenous Once  . [START ON 01/20/2013] insulin aspart  0-15 Units Subcutaneous TID WC  . insulin aspart  0-5 Units Subcutaneous QHS  . [START ON 01/20/2013] insulin glargine  10 Units Subcutaneous Daily  . lisinopril  40 mg Oral Daily   And  . lisinopril  20 mg Oral QHS  . methylPREDNISolone (SOLU-MEDROL) injection  40 mg Intravenous Q6H  . pantoprazole  40 mg Oral Daily  . simvastatin  20 mg Oral QHS  . sodium chloride  3 mL Intravenous Q12H    Results for orders placed during the hospital encounter of 01/19/13 (from the past 48 hour(s))  CBC WITH DIFFERENTIAL     Status: Abnormal   Collection Time    01/19/13  1:57 AM      Result Value Range   WBC 9.1  4.0 - 10.5 K/uL   RBC 3.19 (*)  3.87 - 5.11 MIL/uL   Hemoglobin 9.0 (*) 12.0 - 15.0 g/dL   HCT 27.6 (*) 36.0 - 46.0 %   MCV 86.5  78.0 - 100.0 fL   MCH 28.2  26.0 - 34.0 pg   MCHC 32.6  30.0 - 36.0 g/dL   RDW 13.8  11.5 - 15.5 %   Platelets 273  150 - 400 K/uL   Neutrophils Relative % 83 (*) 43 - 77 %   Neutro Abs 7.6  1.7 - 7.7 K/uL   Lymphocytes Relative 8 (*) 12 - 46 %   Lymphs Abs 0.7  0.7 - 4.0 K/uL   Monocytes Relative 8  3 - 12 %   Monocytes Absolute 0.7  0.1 - 1.0 K/uL   Eosinophils Relative 1  0 - 5 %   Eosinophils Absolute 0.1  0.0 - 0.7 K/uL   Basophils Relative 0  0 - 1 %   Basophils Absolute 0.0  0.0 - 0.1 K/uL  BASIC METABOLIC PANEL     Status: Abnormal   Collection Time    01/19/13  1:57 AM      Result Value Range   Sodium 137  135 - 145 mEq/L   Potassium 3.8  3.5 - 5.1 mEq/L   Chloride 101  96 - 112 mEq/L   CO2 24  19 - 32 mEq/L   Glucose, Bld 265 (*) 70 - 99 mg/dL   BUN 22  6 - 23 mg/dL   Creatinine, Ser 1.45 (*) 0.50 - 1.10 mg/dL   Calcium 9.4  8.4 - 10.5 mg/dL   GFR calc non Af Amer 35 (*) >90 mL/min   GFR calc Af Amer 41 (*) >90 mL/min   Comment: (NOTE)     The eGFR has been calculated using the CKD EPI equation.     This calculation has not been validated in all clinical situations.     eGFR's persistently <90 mL/min signify possible Chronic Kidney     Disease.  PRO B NATRIURETIC PEPTIDE     Status: Abnormal   Collection Time    01/19/13  1:57 AM      Result Value Range   Pro B Natriuretic peptide (BNP) 10409.0 (*) 0 - 125 pg/mL  TROPONIN I     Status: None   Collection Time    01/19/13  1:57 AM      Result Value Range  Troponin I <0.30  <0.30 ng/mL   Comment:            Due to the release kinetics of cTnI,     a negative result within the first hours     of the onset of symptoms does not rule out     myocardial infarction with certainty.     If myocardial infarction is still suspected,     repeat the test at appropriate intervals.  VITAMIN B12     Status: None    Collection Time    01/19/13  8:30 AM      Result Value Range   Vitamin B-12 383  211 - 911 pg/mL   Comment: Performed at [place]     Status: None   Collection Time    01/19/13  8:30 AM      Result Value Range   Folate >20.0     Comment: (NOTE)     Reference Ranges            Deficient:       0.4 - 3.3 ng/mL            Indeterminate:   3.4 - 5.4 ng/mL            Normal:              > 5.4 ng/mL     Performed at [place] TIBC     Status: Abnormal   Collection Time    01/19/13  8:30 AM      Result Value Range   Iron 25 (*) 42 - 135 ug/dL   TIBC 233 (*) 250 - 470 ug/dL   Saturation Ratios 11 (*) 20 - 55 %   UIBC 208  125 - 400 ug/dL   Comment: Performed at [place]     Status: Abnormal   Collection Time    01/19/13  8:30 AM      Result Value Range   Ferritin 301 (*) 10 - 291 ng/mL   Comment: Performed at [place]     Status: Abnormal   Collection Time    01/19/13  8:30 AM      Result Value Range   Retic Ct Pct 1.7  0.4 - 3.1 %   RBC. 3.16 (*) 3.87 - 5.11 MIL/uL   Retic Count, Manual 53.7  19.0 - 186.0 K/uL  MAGNESIUM     Status: None   Collection Time    01/19/13  8:58 AM      Result Value Range   Magnesium 1.7  1.5 - 2.5 mg/dL  PHOSPHORUS     Status: None   Collection Time    01/19/13  8:58 AM      Result Value Range   Phosphorus 4.1  2.3 - 4.6 mg/dL  TSH     Status: None   Collection Time    01/19/13  8:58 AM      Result Value Range   TSH 1.316  0.350 - 4.500 uIU/mL   Comment: Performed at [place], [facility]     Status: Abnormal   Collection Time    01/19/13  9:25 AM      Result Value Range   Glucose-Capillary 246 (*) 70 - 99 mg/dL  GLUCOSE, CAPILLARY     Status: Abnormal   Collection Time    01/19/13 11:51 AM      Result  Value Range   Glucose-Capillary 227 (*) 70 - 99 mg/dL  GLUCOSE, CAPILLARY     Status: Abnormal   Collection Time     01/19/13  4:29 PM      Result Value Range   Glucose-Capillary 183 (*) 70 - 99 mg/dL  TROPONIN I     Status: Abnormal   Collection Time    01/19/13  6:35 PM      Result Value Range   Troponin I 2.37 (*) <0.30 ng/mL   Comment:            Due to the release kinetics of cTnI,     a negative result within the first hours     of the onset of symptoms does not rule out     myocardial infarction with certainty.     If myocardial infarction is still suspected,     repeat the test at appropriate intervals.     CRITICAL RESULT CALLED TO, READ BACK BY AND VERIFIED WITH:     L.FORTE AT 1956 ON 01/19/13 BY S.VANHOORNE  TROPONIN I     Status: Abnormal   Collection Time    01/19/13  8:54 PM      Result Value Range   Troponin I 1.97 (*) <0.30 ng/mL   Comment:            Due to the release kinetics of cTnI,     a negative result within the first hours     of the onset of symptoms does not rule out     myocardial infarction with certainty.     If myocardial infarction is still suspected,     repeat the test at appropriate intervals.     CRITICAL VALUE NOTED.  VALUE IS CONSISTENT WITH PREVIOUSLY REPORTED AND CALLED VALUE.  GLUCOSE, CAPILL[facility]     Status: Abnormal   Collection Time    01/19/13  9:33 PM      Result Value Range   Glucose-Capillary 445 (*) 70 - 99 mg/dL   Comment 1 Notify RN      Dg Chest Port 1 View  01/19/2013   CLINICAL DATA:  Chest pains, shortness of breath  EXAM: PORTABLE CHEST - 1 VIEW  COMPARISON:  07/19/2012  FINDINGS: New bilateral interstitial opacities diffusely with small effusions. Heart is enlarged. Finds compatible with CHF. Prior median sternotomy wires noted. No pneumothorax. Degenerative changes of the shoulders and spine.  IMPRESSION: Moderate CHF pattern   Electronically Signed   By: Daryll Brod M.D.   On: 01/19/2013 01:23    Review of Systems  Constitutional: Positive for malaise/fatigue. Negative for fever and chills.  HENT: Negative for ear pain.    Eyes: Negative for double vision and pain.  Respiratory: Positive for cough and shortness of breath. Negative for hemoptysis and sputum production.   Cardiovascular: Positive for orthopnea. Negative for chest pain and palpitations.  Gastrointestinal: Negative for nausea, vomiting and abdominal pain.  Genitourinary: Negative for dysuria and urgency.  Musculoskeletal: Negative for myalgias and neck pain.  Skin: Negative for rash.  Neurological: Negative for dizziness, tingling, tremors and headaches.  Endo/Heme/Allergies: Negative for environmental allergies.  Psychiatric/Behavioral: Negative for depression, suicidal ideas and substance abuse.   Blood pressure 127/60, pulse 73, temperature 97.8 F (36.6 C), temperature source Oral, resp. rate 20, height 5\' 3"  (1.6 m), weight 67.994 kg (149 lb 14.4 oz), SpO2 95.00%. Physical Exam  Nursing note and vitals reviewed. Constitutional: She appears well-developed and well-nourished.  HENT:  Head:  Normocephalic and atraumatic.  Nose: Nose normal.  Mouth/Throat: Oropharynx is clear and moist. No oropharyngeal exudate.  Eyes: Conjunctivae and EOM are normal. Pupils are equal, round, and reactive to light. No scleral icterus.  Neck: Normal range of motion. Neck supple. JVD present. No tracheal deviation present.  jvp midneck with slightly HJR  Cardiovascular: Normal rate, regular rhythm, normal heart sounds and intact distal pulses.  Exam reveals no gallop.   No murmur heard. Respiratory: Effort normal and breath sounds normal. No respiratory distress. She has no wheezes.  GI: Soft. Bowel sounds are normal. She exhibits no distension. There is no tenderness. There is no rebound.  Musculoskeletal: Normal range of motion. She exhibits no edema and no tenderness.  Neurological: No cranial nerve deficit.  Skin: Skin is warm and dry. No rash noted. She is not diaphoretic. No erythema.  Psychiatric: She has a normal mood and affect.   Labs reviewed;  Trop 2.37 --> 1.97 H/h 9/27.6, plt 273, wbc 9.1, na 137, K 3.8, bun/cr 22/1.45, glucose 265 BNP 10k TSH 1.32 1/13 nuclear stress negative 1/13 Echo: EF 55-60%, mild hypo anteroseptal wall; mild Systole/diastolic septal flattening, PASP 31 mmHg Chest x-ray: diffuse pulmonary edema  Problem List Shortness of Breath/Acute on chronic diastolic heart failure (HFpEF) NSTEMI - Type II?  Chronic Stage III CKD Elevated BNP Known CAD/CABG COPD T2DM Hypertension Moderate TR Anemia - chronic disease Acute Bronchitis/CAP    Assessment/Plan: Laura Best is a pleasant 72 yo woman with multiple medical issues who comes in with shortness of breath and found to have diffuse pulmonary edema and elevated BNP. She is initiated on therapy for diuresis as well as infection/CAP and she felt improved today; however, she had a repeat troponin that was positive at 2.37 with repeat at 1.97 leading the hospitalist team at [facility] to pursue transfer to [facility]. Given overt heart failure and flat but elevated troponins, I favor a diagnosis of Type II NSTEMI with demand ischemia from heart failure and/or pulmonary infectious process in setting of known CAD and prior CABG. She's on heparin gtt so will continue to trend troponins overnight and watch symptoms. Will also make her NPO. At this point, I favor deferring LHC as long as her symptoms continue to improve.  - continue diuresis - will reassess in AM; keep NPO for now and continue heparin gtt for now - heparin gtt ok; aspirin 81 mg daily - agree with current therapy for CAP Adir Schicker 01/19/2013, 10:39 PM

## 2013-01-19 NOTE — Progress Notes (Signed)
Informed By RN that patient a troponin positive at 2.3. Patient was then subsequently seen and examined. Claims to feel much better than on admission. No further chest pain since this morning. Admitted last night with left-sided chest pain and shortness of breath that was made worse by exertion. Found to be in acute on chronic diastolic heart failure, with elevated BNP and a chest x-ray with pulmonary edema. Initial troponins were negative. She was admitted and started on IV diuresis with significant clinical improvement. EKG essentially unchanged. During my evaluation, lying very comfortably and not in any distress.  Chest-few bibasilar rales but otherwise clear CVS-S1 and S2 regular. Murmurs. Abdomen-soft and nontender. Nondistended Neurology-nonfocal  Impression: Non-STEMI Acute on chronic diastolic heart failure  Plan Start full dose aspirin and heparin Continue with current diuretic regimen, continue with current antihypertensive medication Spoke with cardiology on-call-Dr. Claiborne Billings- who advised above as well, patient will be transferred to [facility] cardiology evaluation and further management.  Total time spent 45 minutes

## 2013-01-19 NOTE — Progress Notes (Addendum)
Pt was ordered to have troponin levels drawn once and then Q3hours x 3. Levels were never drawn. RN called lab to ask why levels were not drawn, lab reported that the order did not show up on their end. I re entered the order, but lab was still unable to see it. They they re ordered the troponin levels on their end. Pt's VS have been stable and she has not complained of any chest pain. Troponins were abnormal and MD was notified. A [facility] Portal was put into the computer.

## 2013-01-19 NOTE — Progress Notes (Signed)

## 2013-01-20 DIAGNOSIS — IMO0001 Reserved for inherently not codable concepts without codable children: Secondary | ICD-10-CM

## 2013-01-20 DIAGNOSIS — J441 Chronic obstructive pulmonary disease with (acute) exacerbation: Secondary | ICD-10-CM

## 2013-01-20 DIAGNOSIS — J209 Acute bronchitis, unspecified: Secondary | ICD-10-CM | POA: Diagnosis present

## 2013-01-20 DIAGNOSIS — I517 Cardiomegaly: Secondary | ICD-10-CM

## 2013-01-20 DIAGNOSIS — R9431 Abnormal electrocardiogram [ECG] [EKG]: Secondary | ICD-10-CM

## 2013-01-20 LAB — CBC
Hemoglobin: 8.8 g/dL — ABNORMAL LOW (ref 12.0–15.0)
MCH: 28.6 pg (ref 26.0–34.0)
MCHC: 33.8 g/dL (ref 30.0–36.0)
MCV: 84.4 fL (ref 78.0–100.0)
Platelets: 258 10*3/uL (ref 150–400)
RBC: 3.08 MIL/uL — ABNORMAL LOW (ref 3.87–5.11)
RDW: 13.8 % (ref 11.5–15.5)
WBC: 6.5 10*3/uL (ref 4.0–10.5)

## 2013-01-20 LAB — HEPARIN LEVEL (UNFRACTIONATED)
Heparin Unfractionated: 0.21 IU/mL — ABNORMAL LOW (ref 0.30–0.70)
Heparin Unfractionated: 0.28 IU/mL — ABNORMAL LOW (ref 0.30–0.70)

## 2013-01-20 LAB — BASIC METABOLIC PANEL
BUN: 32 mg/dL — ABNORMAL HIGH (ref 6–23)
CO2: 25 mEq/L (ref 19–32)
Calcium: 8 mg/dL — ABNORMAL LOW (ref 8.4–10.5)
Creatinine, Ser: 1.91 mg/dL — ABNORMAL HIGH (ref 0.50–1.10)
GFR calc Af Amer: 29 mL/min — ABNORMAL LOW (ref >90)
GFR calc non Af Amer: 25 mL/min — ABNORMAL LOW (ref >90)
Glucose, Bld: 400 mg/dL — ABNORMAL HIGH (ref 70–99)
Potassium: 4.5 mEq/L (ref 3.5–5.1)
Sodium: 132 mEq/L — ABNORMAL LOW (ref 135–145)

## 2013-01-20 LAB — GLUCOSE, CAPILLARY
Glucose-Capillary: 433 mg/dL — ABNORMAL HIGH (ref 70–99)
Glucose-Capillary: 339 mg/dL — ABNORMAL HIGH (ref 70–99)
Glucose-Capillary: 294 mg/dL — ABNORMAL HIGH (ref 70–99)

## 2013-01-20 MED ORDER — IPRATROPIUM BROMIDE 0.02 % IN SOLN
0.5000 mg | Freq: Three times a day (TID) | RESPIRATORY_TRACT | Status: DC
  Administered 2013-01-20 – 2013-01-21 (×5): 0.5 mg via RESPIRATORY_TRACT
  Filled 2013-01-20 (×8): qty 2.5

## 2013-01-20 MED ORDER — ALBUTEROL SULFATE (2.5 MG/3ML) 0.083% IN NEBU
2.5000 mg | INHALATION_SOLUTION | Freq: Three times a day (TID) | RESPIRATORY_TRACT | Status: DC
  Administered 2013-01-20 – 2013-01-21 (×4): 2.5 mg via RESPIRATORY_TRACT
  Filled 2013-01-20 (×2): qty 3
  Filled 2013-01-20: qty 0.5
  Filled 2013-01-20 (×3): qty 3

## 2013-01-20 MED ORDER — DOXYCYCLINE HYCLATE 100 MG PO TABS
100.0000 mg | ORAL_TABLET | Freq: Two times a day (BID) | ORAL | Status: DC
  Administered 2013-01-20 – 2013-01-24 (×9): 100 mg via ORAL
  Filled 2013-01-20 (×10): qty 1

## 2013-01-20 MED ORDER — HEPARIN (PORCINE) IN NACL 100-0.45 UNIT/ML-% IJ SOLN: 1000.0000 [IU]/h | INTRAMUSCULAR | Status: DC

## 2013-01-20 MED ORDER — INSULIN GLARGINE 100 UNIT/ML ~~LOC~~ SOLN: 15.0000 [IU] | Freq: Every day | SUBCUTANEOUS | Status: DC

## 2013-01-20 MED ORDER — INSULIN GLARGINE 100 UNIT/ML ~~LOC~~ SOLN
10.0000 [IU] | Freq: Once | SUBCUTANEOUS | Status: AC
  Administered 2013-01-20: 10 [IU] via SUBCUTANEOUS
  Filled 2013-01-20: qty 0.1

## 2013-01-20 MED ORDER — INSULIN GLARGINE 100 UNIT/ML ~~LOC~~ SOLN
15.0000 [IU] | Freq: Every day | SUBCUTANEOUS | Status: DC
  Filled 2013-01-20: qty 0.15

## 2013-01-20 MED ORDER — INSULIN ASPART 100 UNIT/ML ~~LOC~~ SOLN
0.0000 [IU] | SUBCUTANEOUS | Status: DC
  Administered 2013-01-20: 9 [IU] via SUBCUTANEOUS

## 2013-01-20 MED ORDER — INSULIN ASPART 100 UNIT/ML ~~LOC~~ SOLN
0.0000 [IU] | Freq: Three times a day (TID) | SUBCUTANEOUS | Status: DC
  Administered 2013-01-20: 11 [IU] via SUBCUTANEOUS
  Administered 2013-01-20: 5 [IU] via SUBCUTANEOUS
  Administered 2013-01-21 (×2): 3 [IU] via SUBCUTANEOUS
  Administered 2013-01-21 – 2013-01-22 (×2): 11 [IU] via SUBCUTANEOUS
  Administered 2013-01-22: 15 [IU] via SUBCUTANEOUS
  Administered 2013-01-22: 11 [IU] via SUBCUTANEOUS
  Administered 2013-01-23 (×3): 3 [IU] via SUBCUTANEOUS
  Administered 2013-01-24: 11 [IU] via SUBCUTANEOUS

## 2013-01-20 MED ORDER — ASPIRIN EC 81 MG PO TBEC
81.0000 mg | DELAYED_RELEASE_TABLET | Freq: Every day | ORAL | Status: DC
  Administered 2013-01-20 – 2013-01-24 (×5): 81 mg via ORAL
  Filled 2013-01-20 (×5): qty 1

## 2013-01-20 NOTE — Progress Notes (Addendum)
[facility] PROGRESS NOTE  Laura Best [id number] DOB: 09-17-1940 DOA: 01/19/2013 PCP: [name], PA-C  72 yo female with ho diastolic heart failure, DM, HTN, HLD, CKD 3 admitted to [facility] with chest pain.  Found to have elevated troponin and acute on chronic heart failure.  Transferred to [facility].  Assessment/Plan:  Non Stemi Troponins downtrending. No further chest pain. Management per cardiology Azith discontinued (QT prolongation)  Acute on chronic diastolic heart failure  Management per cardiology. Currently on lasix 40 IV bid.  Acute on CKD stage 3.   Creatinine baseline appears to be approximately 1.5 Currently diuresing HF.   Monitor closely  Acute bronchitis with COPD exacerbation She quit smoking 4 years ago.   CHANGE TO DOXYCYCLINE. D/C STEROIDS. CONTINUE NEBS  DM II Blood sugars increased due to steroids. Will DC. Increase Lantus back to home dose. Hemoglobin A1c above 7. Adjust as needed. Will give an extra dose of Lantus today  DVT Prophylaxis:  Heparin drip  Code Status: full Family Communication: patient alert and orientated. Disposition Plan:  inpatient   Consultants:  cards  Antibiotics:  Discontinued rocephin and Azith. DOXYCYCLINE  HPI/Subjective: Some cough productive of clear sputum. Some chest congestion. No chest pain currently. Breathing much easier. Some sinus pressure and postnasal drip.  Objective: Filed Vitals:   01/19/13 2005 01/19/13 2129 01/20/13 0035 01/20/13 0421  BP: 125/61 127/60 145/64 134/59  Pulse: 64 73 68 62  Temp: 97.9 F (36.6 C) 97.8 F (36.6 C) 97.7 F (36.5 C) 97.5 F (36.4 C)  TempSrc: Oral Oral Oral Oral  Resp: 20 20 19 19   Height:      Weight:   67.9 kg (149 lb 11.1 oz)   SpO2: 95% 95% 95% 92%    Intake/Output Summary (Last 24 hours) at 01/20/13 1117 Last data filed at 01/19/13 1851  Gross per 24 hour  Intake    383 ml  Output      0 ml  Net    383 ml   Filed Weights   01/19/13 0010  01/19/13 0450 01/20/13 0035  Weight: 69.854 kg (154 lb) 67.994 kg (149 lb 14.4 oz) 67.9 kg (149 lb 11.1 oz)    Exam: General: Well developed, well nourished, NAD, appears stated age, INTERMITTENT COUGH HEENT:  PERR, EOMI, Anicteic Sclera, MMM. No pharyngeal erythema or exudates  Neck: Supple, no masses  Cardiovascular: RRR, S1 S2, + murmur Respiratory: CTA WITHOUT WRR Abdomen: Soft, nontender, nondistended, + bowel sounds  Extremities: warm dry without cyanosis clubbing or edema.  Neuro: AAOx3, cranial nerves grossly intact. Strength 5/5 in upper and lower extremities  Skin: Without rashes exudates or nodules.   Psych: Normal affect and demeanor with intact judgement and insight   Data Reviewed: Basic Metabolic Panel:  Recent Labs Lab 01/19/13 0157 01/19/13 0858 01/20/13 0840  NA 137  --  132*  K 3.8  --  4.5  CL 101  --  95*  CO2 24  --  25  GLUCOSE 265*  --  400*  BUN 22  --  32*  CREATININE 1.45*  --  1.91*  CALCIUM 9.4  --  8.0*  MG  --  1.7 1.7  PHOS  --  4.1  --    CBC:  Recent Labs Lab 01/19/13 0157 01/20/13 0840  WBC 9.1 6.5  NEUTROABS 7.6  --   HGB 9.0* 8.8*  HCT 27.6* 26.0*  MCV 86.5 84.4  PLT 273 258   Cardiac Enzymes:  Recent Labs  Lab 01/19/13 0157 01/19/13 1835 01/19/13 2054 01/20/13 0840  TROPONINI <0.30 2.37* 1.97* 0.85*   BNP (last 3 results)  Recent Labs  01/19/13 0157  PROBNP 10409.0*   CBG:  Recent Labs Lab 01/19/13 1151 01/19/13 1629 01/19/13 2133 01/20/13 0613 01/20/13 0810  GLUCAP 227* 183* 445* 442* 433*      Studies: Dg Chest Port 1 View  01/19/2013   CLINICAL DATA:  Chest pains, shortness of breath  EXAM: PORTABLE CHEST - 1 VIEW  COMPARISON:  07/19/2012  FINDINGS: New bilateral interstitial opacities diffusely with small effusions. Heart is enlarged. Finds compatible with CHF. Prior median sternotomy wires noted. No pneumothorax. Degenerative changes of the shoulders and spine.  IMPRESSION: Moderate CHF  pattern   Electronically Signed   By: Daryll Brod M.D.   On: 01/19/2013 01:23    Scheduled Meds: . ipratropium  0.5 mg Nebulization TID   And  . albuterol  2.5 mg Nebulization TID  . amLODipine  10 mg Oral Daily  . aspirin EC  81 mg Oral Daily  . cholecalciferol  4,000 Units Oral Daily  . cloNIDine  0.3 mg Oral BID  . furosemide  40 mg Intravenous Q12H  . heparin  4,000 Units Intravenous Once  . insulin aspart  0-15 Units Subcutaneous TID WC  . insulin glargine  10 Units Subcutaneous Daily  . methylPREDNISolone (SOLU-MEDROL) injection  40 mg Intravenous Q6H  . pantoprazole  40 mg Oral Daily  . simvastatin  20 mg Oral QHS  . sodium chloride  3 mL Intravenous Q12H   Continuous Infusions: . heparin 4,000 Units/hr (01/19/13 2238)    Principal Problem:   Acute diastolic CHF (congestive heart failure) Active Problems:   Diabetes type 2, uncontrolled   COPD (chronic obstructive pulmonary disease)   CKD (chronic kidney disease) stage 3, GFR 30-59 ml/min    Karen Kitchens  [facility] Pager [phone]. If 7PM-7AM, please contact night-coverage at [email or web address], password [facility] 01/20/2013, 11:17 AM  LOS: 1 day   Attending note:  Chart reviewed. Note annotated in caps/bold. Patient does have cough productive of sputum. Likely has acute bronchitis and COPD exacerbation in addition to acute heart failure. Will change antibiotics to oral doxycycline. Currently no wheeze. Stop steroids. Increase insulin. Troponins trending down. No chest pain currently. Appreciate cardiology's assistance.  Doree Barthel, M.D. [facility] [phone]

## 2013-01-20 NOTE — Progress Notes (Signed)
ANTICOAGULATION CONSULT NOTE - Follow Up Consult   HL = 0.28 (goal 0.3 - 0.7 units/mL) Heparin dosing weight = 66 kg   Assessment: [name] on IV heparin for chest pain.  Heparin level slightly below goal post rate increase this AM.  No bleeding reported.   Plan: - Increase heparin gtt to 1100 units/hr - F/U AM labs    Laura Best D. Mina Marble, PharmD, BCPS Pager:  [phone] 01/20/2013, 10:43 PM

## 2013-01-20 NOTE — Progress Notes (Signed)
Patient Name: Laura Best Date of Encounter: 01/20/2013     Principal Problem:   Acute diastolic CHF (congestive heart failure) Active Problems:   Diabetes type 2, uncontrolled   COPD (chronic obstructive pulmonary disease)   CKD (chronic kidney disease) stage 3, GFR 30-59 ml/min    SUBJECTIVE: Breathing improved. Urinary incontinence when she coughs. Occasional left-sided sharp chest pain aggravated by cough, deep inspiration and position changes.   OBJECTIVE  Filed Vitals:   01/19/13 2005 01/19/13 2129 01/20/13 0035 01/20/13 0421  BP: 125/61 127/60 145/64 134/59  Pulse: 64 73 68 62  Temp: 97.9 F (36.6 C) 97.8 F (36.6 C) 97.7 F (36.5 C) 97.5 F (36.4 C)  TempSrc: Oral Oral Oral Oral  Resp: 20 20 19 19   Height:      Weight:   67.9 kg (149 lb 11.1 oz)   SpO2: 95% 95% 95% 92%    Intake/Output Summary (Last 24 hours) at 01/20/13 0902 Last data filed at 01/19/13 1851  Gross per 24 hour  Intake    383 ml  Output      0 ml  Net    383 ml   Weight change: -1.954 kg (-4 lb 4.9 oz)  PHYSICAL EXAM  General: Appears older than stated age, well nourished, in no acute distress. Head: Normocephalic, atraumatic, sclera non-icteric, no xanthomas, nares are without discharge.  Neck: Supple without bruits. JBP 8-9 cm. Lungs: Cough appreciated. Resp regular and unlabored, dimished breath sounds bilaterally, intermittent rhonchi which clears with cough. + bibasilar rales.  Heart: RRR no s3, s4, or murmurs. Abdomen: Soft, non-tender, distended, hypoactive BS + x 4.  Msk:  Strength and tone appears normal for age. Extremities: No clubbing, cyanosis or edema. DP/PT/Radials 2+ and equal bilaterally. Neuro: Alert and oriented X 3. Moves all extremities spontaneously. Psych: Normal affect.  LABS:  Recent Labs     01/19/13  0157  WBC  9.1  HGB  9.0*  HCT  27.6*  MCV  86.5  PLT  273    Recent Labs  01/19/13 0830  VITAMINB12 383  FOLATE >20.0  FERRITIN 301*  TIBC  233*  IRON 25*  RETICCTPCT 1.7   Recent Labs Lab 01/19/13 0157  NA 137  K 3.8  CL 101  CO2 24  BUN 22  CREATININE 1.45*  CALCIUM 9.4  GLUCOSE 265*   Recent Labs     01/19/13  0858  HGBA1C  7.4*   Recent Labs     01/19/13  0157  01/19/13  1835  01/19/13  2054  TROPONINI  <0.30  2.37*  1.97*   Recent Labs  01/19/13 0858  TSH 1.316    TELE: NSR, 60-70 bpm  Radiology/Studies:  Ct Abdomen Pelvis Wo Contrast  12/31/2012   CLINICAL DATA:  History of para appendiceal abscess with placement of percutaneous drain.  EXAM: CT ABDOMEN AND PELVIS WITHOUT CONTRAST  TECHNIQUE: Multidetector CT imaging of the abdomen and pelvis was performed following the standard protocol without intravenous contrast.  COMPARISON:  CT abdomen and pelvis 08/01/2012.  FINDINGS: Heart size is upper normal. No pleural or pericardial effusion. There is some scarring in the right lung base.  Drainage catheter in the left lower quadrant seen on the prior study has been removed. There is no fluid collection in the abdomen or pelvis. A large volume of stool is seen throughout the colon. The colon is otherwise unremarkable. The appendix has been removed. Small hiatal hernia is noted. The stomach is  otherwise unremarkable. Small bowel appears normal.  The liver, gallbladder, spleen, adrenal glands, pancreas and kidneys appear normal. Extensive aortoiliac atherosclerosis without aneurysm is noted. There is no lymphadenopathy or fluid. No lytic or sclerotic bony lesion is identified.  IMPRESSION: No acute finding. No evidence of complication related to periappendiceal abscess.  Atherosclerosis.  Small hiatal hernia.   Electronically Signed   By: Inge Rise M.D.   On: 12/31/2012 16:10   Dg Chest Port 1 View  01/19/2013   CLINICAL DATA:  Chest pains, shortness of breath  EXAM: PORTABLE CHEST - 1 VIEW  COMPARISON:  07/19/2012  FINDINGS: New bilateral interstitial opacities diffusely with small effusions. Heart is  enlarged. Finds compatible with CHF. Prior median sternotomy wires noted. No pneumothorax. Degenerative changes of the shoulders and spine.  IMPRESSION: Moderate CHF pattern   Electronically Signed   By: Daryll Brod M.D.   On: 01/19/2013 01:23    Current Medications:  . ipratropium  0.5 mg Nebulization TID   And  . albuterol  2.5 mg Nebulization TID  . amLODipine  10 mg Oral Daily  . aspirin EC  81 mg Oral Daily  . azithromycin  250 mg Oral Daily  . cefTRIAXone (ROCEPHIN)  IV  1 g Intravenous Q24H  . cholecalciferol  4,000 Units Oral Daily  . cloNIDine  0.3 mg Oral BID  . furosemide  40 mg Intravenous Q12H  . heparin  4,000 Units Intravenous Once  . insulin aspart  0-9 Units Subcutaneous Q4H  . insulin glargine  10 Units Subcutaneous Daily  . methylPREDNISolone (SOLU-MEDROL) injection  40 mg Intravenous Q6H  . pantoprazole  40 mg Oral Daily  . simvastatin  20 mg Oral QHS  . sodium chloride  3 mL Intravenous Q12H    ASSESSMENT AND PLAN:  1. Acute hypoxic respiratory failure- secondary to #2, #3    2. Acute on chronic diastolic CHF- EF 0000000, mild-mod LVH on 01/2011 echo. Pro BNP > 10K on admission. CXR- moderate CHF. Question A/COPD contributing to CHF decompensation. She also reports stopping a fluid pill ~ 1 month ago per PCP recommendations. Minimal UOP recorded on Lasix 20mg  IV. Incontinent of urine w/ coughing (? Incomplete I/O). GFR 35. Weight 154 lbs on office follow-up 12/18. 149 lbs today. ++JVD, abdominal distention on exam. COPD, h/o TR, likely pulmonary HTN.  -- Increase Lasix to 40mg  IV BID to have effect given reduced GFR -- Place Foley cath -- Obtain 2D echo -- Continue strict I/Os, daily weights, salt restriction -- Avoid hypoxia, treat infection, control BP -- Likely needs daily PO Lasix  3. Acute COPD exacerbation- cough productive of green sputum last week. Contributing to CHF exacerbation.  -- Management per primary team.   4. NSTEMI- the patient reports  experiencing atypical, left-sided sharp chest pain on cough and inspiration. Given A/COPD, suspect this represents pleuritic chest pain. TnI trend this admission: <0.30->2.37->1.97. EKGs yesterday do show new diffuse TWIs. She has a history of CAD s/p CABG in 1993 (grafts > 13 years old). Stress Myoview 04/2011 w/o evidence of ischemia or infarction, EF 51%. TnI elevation is fairly significant. It has plateaued an down-trended which supports a demand-related pattern. Given old grafts, h/o CAD and EKG changes, may need to consider ischemic evaluation. Will use echo to guide this. If EF preserved with minimal WMAs, attribute to demand ischemia. R/o alternative etiologies- Takotsubo's, etc.  -- Continue cycling TnIs, check echo -- ASA allergy (diarrhea), consider Plavix -- Continue ACEi, statin -- Borderline bradycardia  precludes BB therapy  5. Pleuritic chest pain- in the setting of COPD exacerbation (cough).  -- Supportive management.  6. Stress incontinence- urinary incontinence upon coughing.  -- Place Foley -- Further management per primary team -- Follow-up PCP at discharge  Signed, R. Valeria Batman, PA-C 01/20/2013, 9:02 AM   I have seen, examined the patient, and reviewed the above assessment and plan.  Changes to above are made where necessary.  Pt is quite ill with chronic lung disease, anemia, worsening renal failure and volume overload.  I agree that primary issue likely stems from respiratory failure but an concerned for underlying ischemia given elevated troponin and ekg changes.  She is presently not a cath candidate in the setting of acute renal failure.  Continue diuresis as above and treatment of chronic lung disease/ pneumonia by the primary team.  I am concerned about qt prolongation and would advise that we avoid Qt prolonging drugs including antibiotics if able.  May consider nuclear stress testing once clinically improved.  Co Sign: Thompson Grayer, MD 01/20/2013 10:13 AM

## 2013-01-20 NOTE — Progress Notes (Signed)
ANTICOAGULATION CONSULT NOTE - Follow Up Consult  Pharmacy Consult for Heparin Indication: chest pain/ACS  Allergies  Allergen Reactions  . Aspirin Other (See Comments)    G.I. Upset.   . Metformin And Related Diarrhea    Patient Measurements: Height: 5\' 3"  (160 cm) Weight: 149 lb 11.1 oz (67.9 kg) IBW/kg (Calculated) : 52.4 Heparin Dosing Weight: 66 kg  Vital Signs: Temp: 97.5 F (36.4 C) (12/29 0421) Temp src: Oral (12/29 0421) BP: 134/59 mmHg (12/29 0421) Pulse Rate: 62 (12/29 0421)  Labs:  Recent Labs  01/19/13 0157 01/19/13 1835 01/19/13 2054 01/20/13 0840  HGB 9.0*  --   --  8.8*  HCT 27.6*  --   --  26.0*  PLT 273  --   --  258  HEPARINUNFRC  --   --   --  0.21*  CREATININE 1.45*  --   --  1.91*  TROPONINI <0.30 2.37* 1.97* 0.85*    Estimated Creatinine Clearance: 24.6 ml/min (by C-G formula based on Cr of 1.91).   Medications:  Scheduled:  . ipratropium  0.5 mg Nebulization TID   And  . albuterol  2.5 mg Nebulization TID  . amLODipine  10 mg Oral Daily  . aspirin EC  81 mg Oral Daily  . azithromycin  250 mg Oral Daily  . cefTRIAXone (ROCEPHIN)  IV  1 g Intravenous Q24H  . cholecalciferol  4,000 Units Oral Daily  . cloNIDine  0.3 mg Oral BID  . furosemide  40 mg Intravenous Q12H  . heparin  4,000 Units Intravenous Once  . insulin aspart  0-9 Units Subcutaneous Q4H  . insulin glargine  10 Units Subcutaneous Daily  . methylPREDNISolone (SOLU-MEDROL) injection  40 mg Intravenous Q6H  . pantoprazole  40 mg Oral Daily  . simvastatin  20 mg Oral QHS  . sodium chloride  3 mL Intravenous Q12H   Infusions:  . heparin 4,000 Units/hr (01/19/13 2238)    Assessment: 72 yo F presented to [facility] with occasional left-sided CP, found to have elevated troponin and transferred to [facility] for cardiology eval.  Cannot have cardiac cath atthis time due to rising SCr 1.45 > 1.91.  Continues on heparin infusion at 800 units/hr.  Heparin level sub therapeutic on current  rate.  No bleeding noted.   Goal of Therapy:  Heparin level 0.3-0.7 units/ml Monitor platelets by anticoagulation protocol: Yes   Plan:  Increase heparin infusion to 950 units/hr. Recheck heparin level in 6 hours. Continue daily heparin level and CBC.  [employer], Pharm.D., BCPS Clinical Pharmacist Pager [phone] 01/20/2013 10:57 AM

## 2013-01-20 NOTE — Progress Notes (Signed)
Received from [facility] via [place]. Pt denies pain or shob except when she lies flat.  Oriented to room, call bell in reach.

## 2013-01-20 NOTE — Progress Notes (Signed)
  Echocardiogram 2D Echocardiogram has been performed.  Laura Best 01/20/2013, 1:58 PM

## 2013-01-21 DIAGNOSIS — D638 Anemia in other chronic diseases classified elsewhere: Secondary | ICD-10-CM | POA: Diagnosis present

## 2013-01-21 DIAGNOSIS — N179 Acute kidney failure, unspecified: Secondary | ICD-10-CM

## 2013-01-21 DIAGNOSIS — I10 Essential (primary) hypertension: Secondary | ICD-10-CM

## 2013-01-21 LAB — GLUCOSE, CAPILLARY
Glucose-Capillary: 327 mg/dL — ABNORMAL HIGH (ref 70–99)
Glucose-Capillary: 159 mg/dL — ABNORMAL HIGH (ref 70–99)

## 2013-01-21 LAB — CBC
HCT: 24.6 % — ABNORMAL LOW (ref 36.0–46.0)
MCHC: 32.5 g/dL (ref 30.0–36.0)
Platelets: 246 10*3/uL (ref 150–400)
RBC: 2.86 MIL/uL — ABNORMAL LOW (ref 3.87–5.11)
RDW: 14.1 % (ref 11.5–15.5)

## 2013-01-21 LAB — HEPARIN LEVEL (UNFRACTIONATED): Heparin Unfractionated: 0.81 [IU]/mL — ABNORMAL HIGH (ref 0.30–0.70)

## 2013-01-21 LAB — BASIC METABOLIC PANEL
BUN: 52 mg/dL — ABNORMAL HIGH (ref 6–23)
CO2: 23 mEq/L (ref 19–32)
Creatinine, Ser: 2.65 mg/dL — ABNORMAL HIGH (ref 0.50–1.10)
GFR calc Af Amer: 20 mL/min — ABNORMAL LOW (ref >90)
GFR calc non Af Amer: 17 mL/min — ABNORMAL LOW (ref >90)
Potassium: 5.2 mEq/L (ref 3.7–5.3)
Sodium: 133 mEq/L — ABNORMAL LOW (ref 137–147)

## 2013-01-21 MED ORDER — INSULIN GLARGINE 100 UNIT/ML ~~LOC~~ SOLN
18.0000 [IU] | Freq: Every day | SUBCUTANEOUS | Status: DC
  Administered 2013-01-21: 18 [IU] via SUBCUTANEOUS
  Filled 2013-01-21: qty 0.18

## 2013-01-21 MED ORDER — CLONIDINE HCL 0.2 MG PO TABS
0.2000 mg | ORAL_TABLET | Freq: Two times a day (BID) | ORAL | Status: DC
  Administered 2013-01-21 – 2013-01-22 (×2): 0.2 mg via ORAL
  Filled 2013-01-21 (×4): qty 1

## 2013-01-21 MED ORDER — ALBUTEROL SULFATE (2.5 MG/3ML) 0.083% IN NEBU
2.5000 mg | INHALATION_SOLUTION | Freq: Three times a day (TID) | RESPIRATORY_TRACT | Status: DC
  Administered 2013-01-21: 2.5 mg via RESPIRATORY_TRACT

## 2013-01-21 MED ORDER — PREDNISONE 20 MG PO TABS
40.0000 mg | ORAL_TABLET | Freq: Two times a day (BID) | ORAL | Status: DC
  Administered 2013-01-21 – 2013-01-22 (×2): 40 mg via ORAL
  Filled 2013-01-21 (×4): qty 2

## 2013-01-21 MED ORDER — INSULIN GLARGINE 100 UNIT/ML ~~LOC~~ SOLN
22.0000 [IU] | Freq: Every day | SUBCUTANEOUS | Status: DC
  Filled 2013-01-21: qty 0.22

## 2013-01-21 MED ORDER — IPRATROPIUM BROMIDE 0.02 % IN SOLN
0.5000 mg | Freq: Four times a day (QID) | RESPIRATORY_TRACT | Status: DC
  Administered 2013-01-21: 0.5 mg via RESPIRATORY_TRACT
  Filled 2013-01-21: qty 2.5

## 2013-01-21 MED ORDER — ALBUTEROL SULFATE (2.5 MG/3ML) 0.083% IN NEBU
INHALATION_SOLUTION | RESPIRATORY_TRACT | Status: AC
  Filled 2013-01-21: qty 3

## 2013-01-21 MED ORDER — ALBUTEROL SULFATE (2.5 MG/3ML) 0.083% IN NEBU: 2.5000 mg | INHALATION_SOLUTION | RESPIRATORY_TRACT | Status: DC | PRN

## 2013-01-21 MED ORDER — DM-GUAIFENESIN ER 30-600 MG PO TB12
1.0000 | ORAL_TABLET | Freq: Two times a day (BID) | ORAL | Status: DC
  Administered 2013-01-21 – 2013-01-24 (×6): 1 via ORAL
  Filled 2013-01-21 (×7): qty 1

## 2013-01-21 MED ORDER — ALBUTEROL SULFATE (2.5 MG/3ML) 0.083% IN NEBU
2.5000 mg | INHALATION_SOLUTION | Freq: Four times a day (QID) | RESPIRATORY_TRACT | Status: DC
  Administered 2013-01-21: 2.5 mg via RESPIRATORY_TRACT
  Filled 2013-01-21: qty 3

## 2013-01-21 NOTE — Progress Notes (Signed)
Creatinine doubled.  Hold lasix. Will round later.  Doree Barthel, MD [facility] [phone]

## 2013-01-21 NOTE — Progress Notes (Signed)
ANTICOAGULATION CONSULT NOTE - Follow Up Consult  Pharmacy Consult for Heparin Indication: chest pain/ACS  Allergies  Allergen Reactions  . Aspirin Other (See Comments)    G.I. Upset.   . Metformin And Related Diarrhea    Patient Measurements: Height: 5\' 3"  (160 cm) Weight: 151 lb 3.8 oz (68.6 kg) IBW/kg (Calculated) : 52.4 Heparin Dosing Weight: 66 kg  Vital Signs: Temp: 98.3 F (36.8 C) (12/30 0519) Temp src: Oral (12/30 0519) BP: 139/59 mmHg (12/30 0519) Pulse Rate: 64 (12/30 0519)  Labs:  Recent Labs  01/19/13 0157 01/19/13 1835 01/19/13 2054  01/20/13 0840 01/20/13 1630 01/20/13 2145 01/21/13 0550  HGB 9.0*  --   --   --  8.8*  --   --  8.0*  HCT 27.6*  --   --   --  26.0*  --   --  24.6*  PLT 273  --   --   --  258  --   --  246  HEPARINUNFRC  --   --   --   < > 0.21* 0.21* 0.28* 0.81*  CREATININE 1.45*  --   --   --  1.91*  --   --  2.65*  TROPONINI <0.30 2.37* 1.97*  --  0.85*  --   --   --   < > = values in this interval not displayed.  Estimated Creatinine Clearance: 17.8 ml/min (by C-G formula based on Cr of 2.65).   Medications:  Scheduled:  . ipratropium  0.5 mg Nebulization TID   And  . albuterol  2.5 mg Nebulization TID  . amLODipine  10 mg Oral Daily  . aspirin EC  81 mg Oral Daily  . cholecalciferol  4,000 Units Oral Daily  . cloNIDine  0.3 mg Oral BID  . doxycycline  100 mg Oral Q12H  . heparin  4,000 Units Intravenous Once  . insulin aspart  0-15 Units Subcutaneous TID WC  . insulin glargine  18 Units Subcutaneous Daily  . pantoprazole  40 mg Oral Daily  . simvastatin  20 mg Oral QHS  . sodium chloride  3 mL Intravenous Q12H   Infusions:  . heparin 1,100 Units/hr (01/20/13 2301)    Assessment: 72 yo F presented to [facility] with occasional left-sided CP, found to have elevated troponin and transferred to [facility] for cardiology eval.  Cannot have cardiac cath at this time due to rising SCr 1.45 > 1.91 > 2.65.  Continues on heparin infusion at  1100 units/hr.  Heparin level supra therapeutic on current rate.  No bleeding noted.   Goal of Therapy:  Heparin level 0.3-0.7 units/ml Monitor platelets by anticoagulation protocol: Yes   Plan:  Decrease heparin infusion to 1000 units/hr. Recheck heparin level in 6 hours. Continue daily heparin level and CBC.  [employer], Pharm.D., BCPS Clinical Pharmacist Pager [phone] 01/21/2013 8:30 AM

## 2013-01-21 NOTE — Progress Notes (Signed)
[facility] PROGRESS NOTE  Laura Best [id number] DOB: 03-Apr-1940 DOA: 01/19/2013 PCP: [name], PA-C  72 yo female with ho diastolic heart failure, DM, HTN, HLD, CKD 3 admitted to [facility] with chest [facility].  Found to have elevated troponin and acute on chronic heart failure.  Transferred to [facility].  Assessment/Plan:  Non Stemi  Acute on chronic diastolic heart failure  Lasix held due to ARF  Acute renal failure due to diuresis. Lasix held  Acute on CKD stage 3.     Acute bronchitis with COPD exacerbation Wheezing more. Purulent sputum. Resume prednisone. Increase nebs. Continue doxy  DM II CBGs better, but not optimal. Increase lantus  Anemia: No bleeding noted. Check hemoccult  HTN: decrease clonidine  Code Status: full Family Communication: patient alert and orientated. Disposition Plan:  inpatient   Consultants:  cards  Antibiotics:  DOXYCYCLINE  HPI/Subjective: Cough now productive of thick green sputum. No CP  Objective: Filed Vitals:   01/21/13 1031 01/21/13 1430 01/21/13 1431 01/21/13 1500  BP:  100/39 108/48   Pulse:  49  55  Temp:  97.7 F (36.5 C)    TempSrc:  Oral    Resp:  18    Height:      Weight:      SpO2: 98% 95% 100%     Intake/Output Summary (Last 24 hours) at 01/21/13 1751 Last data filed at 01/21/13 1300  Gross per 24 hour  Intake    720 ml  Output    900 ml  Net   -180 ml   Filed Weights   01/19/13 0450 01/20/13 0035 01/21/13 0500  Weight: 67.994 kg (149 lb 14.4 oz) 67.9 kg (149 lb 11.1 oz) 68.6 kg (151 lb 3.8 oz)    Exam: General: coughing thick yellow Neck: Supple, no masses  Cardiovascular: RRR. slow Respiratory: bilateral wheeze. No rales Abdomen: Soft, nontender, nondistended, + bowel sounds  Extremities: warm dry without cyanosis clubbing or edema.    Data Reviewed: Basic Metabolic Panel:  Recent Labs Lab 01/19/13 0157 01/19/13 0858 01/20/13 0840 01/21/13 0550  NA 137  --  132* 133*  K 3.8   --  4.5 5.2  CL 101  --  95* 96  CO2 24  --  25 23  GLUCOSE 265*  --  400* 302*  BUN 22  --  32* 52*  CREATININE 1.45*  --  1.91* 2.65*  CALCIUM 9.4  --  8.0* 7.3*  MG  --  1.7 1.7  --   PHOS  --  4.1  --   --    CBC:  Recent Labs Lab 01/19/13 0157 01/20/13 0840 01/21/13 0550  WBC 9.1 6.5 12.4*  NEUTROABS 7.6  --   --   HGB 9.0* 8.8* 8.0*  HCT 27.6* 26.0* 24.6*  MCV 86.5 84.4 86.0  PLT 273 258 246   Cardiac Enzymes:  Recent Labs Lab 01/19/13 0157 01/19/13 1835 01/19/13 2054 01/20/13 0840  TROPONINI <0.30 2.37* 1.97* 0.85*   BNP (last 3 results)  Recent Labs  01/19/13 0157  PROBNP 10409.0*   CBG:  Recent Labs Lab 01/20/13 2040 01/21/13 0010 01/21/13 0611 01/21/13 1114 01/21/13 1705  GLUCAP 294* 317* 327* 159* 174*      Studies: No results found.  Scheduled Meds: . albuterol  2.5 mg Nebulization TID  . albuterol      . amLODipine  10 mg Oral Daily  . aspirin EC  81 mg Oral Daily  . cholecalciferol  4,000 Units  Oral Daily  . cloNIDine  0.3 mg Oral BID  . doxycycline  100 mg Oral Q12H  . insulin aspart  0-15 Units Subcutaneous TID WC  . insulin glargine  18 Units Subcutaneous Daily  . ipratropium  0.5 mg Nebulization TID  . pantoprazole  40 mg Oral Daily  . simvastatin  20 mg Oral QHS  . sodium chloride  3 mL Intravenous Q12H   Continuous Infusions:    Principal Problem:   Acute diastolic CHF (congestive heart failure) Active Problems:   Diabetes type 2, uncontrolled   COPD exacerbation   CKD (chronic kidney disease) stage 3, GFR 30-59 ml/min   Acute bronchitis   Acute renal failure    Delfina Redwood, PA-C  [facility] Pager [phone]. If 7PM-7AM, please contact night-coverage at [email or web address], password [facility] 01/21/2013, 5:51 PM  LOS: 2 days   Doree Barthel, M.D. [facility] [phone]

## 2013-01-21 NOTE — Progress Notes (Signed)
Patient Name: Laura Best Date of Encounter: 01/21/2013     Principal Problem:   Acute diastolic CHF (congestive heart failure) Active Problems:   Diabetes type 2, uncontrolled   COPD exacerbation   CKD (chronic kidney disease) stage 3, GFR 30-59 ml/min   Acute bronchitis    SUBJECTIVE: Breathing improved.  Denies CP  OBJECTIVE  Filed Vitals:   01/20/13 1951 01/20/13 2104 01/21/13 0500 01/21/13 0519  BP: 124/45   139/59  Pulse: 59   64  Temp: 98.2 F (36.8 C)   98.3 F (36.8 C)  TempSrc: Oral   Oral  Resp: 18   18  Height:      Weight:   151 lb 3.8 oz (68.6 kg)   SpO2: 99% 96%  99%    Intake/Output Summary (Last 24 hours) at 01/21/13 0859 Last data filed at 01/21/13 0700  Gross per 24 hour  Intake   1080 ml  Output   1100 ml  Net    -20 ml   Weight change: 1 lb 8.7 oz (0.7 kg)  PHYSICAL EXAM  General: Appears older than stated age, well nourished, in no acute distress. Head: Normocephalic, atraumatic, sclera non-icteric, no xanthomas, nares are without discharge.  Neck: Supple without bruits. JVP 7cm. Lungs:  Diffuse expiratory wheezes with a prolonged expiratory phase.  Heart: RRR no s3, s4, or murmurs. Abdomen: Soft, non-tender, distended, hypoactive BS + x 4.  Msk:  Strength and tone appears normal for age. Extremities: No clubbing, cyanosis or edema. DP/PT/Radials 2+ and equal bilaterally. Neuro: Alert and oriented X 3. Moves all extremities spontaneously. Psych: Normal affect.  LABS:  Recent Labs     01/20/13  0840  01/21/13  0550  WBC  6.5  12.4*  HGB  8.8*  8.0*  HCT  26.0*  24.6*  MCV  84.4  86.0  PLT  258  246    Recent Labs  01/19/13 0830  VITAMINB12 383  FOLATE >20.0  FERRITIN 301*  TIBC 233*  IRON 25*  RETICCTPCT 1.7    Recent Labs Lab 01/19/13 0157 01/20/13 0840 01/21/13 0550  NA 137 132* 133*  K 3.8 4.5 5.2  CL 101 95* 96  CO2 24 25 23   BUN 22 32* 52*  CREATININE 1.45* 1.91* 2.65*  CALCIUM 9.4 8.0* 7.3*    GLUCOSE 265* 400* 302*   Recent Labs     01/19/13  0858  HGBA1C  7.4*   Recent Labs     01/19/13  1835  01/19/13  2054  01/20/13  0840  TROPONINI  2.37*  1.97*  0.85*    Recent Labs  01/19/13 0858  TSH 1.316    TELE: NSR, 60-70 bpm  Radiology/Studies:  Ct Abdomen Pelvis Wo Contrast  12/31/2012   CLINICAL DATA:  History of para appendiceal abscess with placement of percutaneous drain.  EXAM: CT ABDOMEN AND PELVIS WITHOUT CONTRAST  TECHNIQUE: Multidetector CT imaging of the abdomen and pelvis was performed following the standard protocol without intravenous contrast.  COMPARISON:  CT abdomen and pelvis 08/01/2012.  FINDINGS: Heart size is upper normal. No pleural or pericardial effusion. There is some scarring in the right lung base.  Drainage catheter in the left lower quadrant seen on the prior study has been removed. There is no fluid collection in the abdomen or pelvis. A large volume of stool is seen throughout the colon. The colon is otherwise unremarkable. The appendix has been removed. Small hiatal hernia is noted. The stomach  is otherwise unremarkable. Small bowel appears normal.  The liver, gallbladder, spleen, adrenal glands, pancreas and kidneys appear normal. Extensive aortoiliac atherosclerosis without aneurysm is noted. There is no lymphadenopathy or fluid. No lytic or sclerotic bony lesion is identified.  IMPRESSION: No acute finding. No evidence of complication related to periappendiceal abscess.  Atherosclerosis.  Small hiatal hernia.   Electronically Signed   By: Inge Rise M.D.   On: 12/31/2012 16:10   Dg Chest Port 1 View  01/19/2013   CLINICAL DATA:  Chest pains, shortness of breath  EXAM: PORTABLE CHEST - 1 VIEW  COMPARISON:  07/19/2012  FINDINGS: New bilateral interstitial opacities diffusely with small effusions. Heart is enlarged. Finds compatible with CHF. Prior median sternotomy wires noted. No pneumothorax. Degenerative changes of the shoulders and  spine.  IMPRESSION: Moderate CHF pattern   Electronically Signed   By: Daryll Brod M.D.   On: 01/19/2013 01:23    Current Medications:  . ipratropium  0.5 mg Nebulization TID   And  . albuterol  2.5 mg Nebulization TID  . amLODipine  10 mg Oral Daily  . aspirin EC  81 mg Oral Daily  . cholecalciferol  4,000 Units Oral Daily  . cloNIDine  0.3 mg Oral BID  . doxycycline  100 mg Oral Q12H  . heparin  4,000 Units Intravenous Once  . insulin aspart  0-15 Units Subcutaneous TID WC  . insulin glargine  18 Units Subcutaneous Daily  . pantoprazole  40 mg Oral Daily  . simvastatin  20 mg Oral QHS  . sodium chloride  3 mL Intravenous Q12H    ASSESSMENT AND PLAN:  1. Acute hypoxic respiratory failure- secondary to #2, #3    2. Acute on chronic diastolic CHF-  She is dry.  Lasix has been held.  Continue to follow closely.  Encourage po hydration today.  3. Acute COPD exacerbation- cough productive of green sputum last week.  I think that this is her primary pulmonary issue presently. -- Management per primary team.   4. NSTEMI-  LIkely due to "demand" ischemia.  My suspicion for an acute thrombosis is low.  GIven anemia, I will stop heparin.  Given renal failure, she is not a candidate for cath at this time. COntinue current medical management.  Echo is reviewed. -- Consider Plavix -- Continue ACEi, statin -- Borderline bradycardia precludes BB therapy May consider nuclear stress testing once clinically improved.  5. Pleuritic chest pain- in the setting of COPD exacerbation (cough).  -- Supportive management.  6. QT prolongation I am concerned about qt prolongation and would advise that we avoid Qt prolonging drugs including antibiotics if able.     Thompson Grayer, MD 01/21/2013 8:59 AM

## 2013-01-21 NOTE — Clinical Documentation Improvement (Signed)
Possible Clinical Conditions?   Acute Renal Failure/Acute Kidney Injury Acute Tubular Necrosis Acute Renal Cortical Necrosis Acute Renal Medullary Necrosis Acute on Chronic Renal Failure Other Condition Cannot Clinically Determine    Risk Factors: Patient with a history of CKD, stage III, noted per 12/28 H&P.  Diagnostics: Bun: 12/30:52.           12/29: 32.          12/28: 22 Creat: 12/30: 2.65.       12/29: 1.91.       12/28: 1.45 GFR: 12/30:17.            12/29: 25.          12/28: 35 Treatments:  Thank You, Theron Arista, Clinical Documentation Specialist:  [phone]  [place] Information Management

## 2013-01-22 DIAGNOSIS — E875 Hyperkalemia: Secondary | ICD-10-CM

## 2013-01-22 LAB — CBC
HCT: 24.1 % — ABNORMAL LOW (ref 36.0–46.0)
Hemoglobin: 7.9 g/dL — ABNORMAL LOW (ref 12.0–15.0)
MCH: 28 pg (ref 26.0–34.0)
WBC: 9.1 10*3/uL (ref 4.0–10.5)

## 2013-01-22 LAB — GLUCOSE, CAPILLARY
Glucose-Capillary: 378 mg/dL — ABNORMAL HIGH (ref 70–99)
Glucose-Capillary: 316 mg/dL — ABNORMAL HIGH (ref 70–99)

## 2013-01-22 LAB — BASIC METABOLIC PANEL
BUN: 75 mg/dL — ABNORMAL HIGH (ref 6–23)
Chloride: 94 mEq/L — ABNORMAL LOW (ref 96–112)
Creatinine, Ser: 2.57 mg/dL — ABNORMAL HIGH (ref 0.50–1.10)
GFR calc Af Amer: 20 mL/min — ABNORMAL LOW (ref >90)
GFR calc non Af Amer: 18 mL/min — ABNORMAL LOW (ref >90)
Glucose, Bld: 343 mg/dL — ABNORMAL HIGH (ref 70–99)
Potassium: 5.5 mEq/L — ABNORMAL HIGH (ref 3.7–5.3)
Sodium: 131 mEq/L — ABNORMAL LOW (ref 137–147)

## 2013-01-22 MED ORDER — IPRATROPIUM-ALBUTEROL 0.5-2.5 (3) MG/3ML IN SOLN
3.0000 mL | RESPIRATORY_TRACT | Status: DC
  Administered 2013-01-22 (×4): 3 mL via RESPIRATORY_TRACT
  Filled 2013-01-22 (×5): qty 3

## 2013-01-22 MED ORDER — INSULIN GLARGINE 100 UNIT/ML ~~LOC~~ SOLN
25.0000 [IU] | Freq: Every day | SUBCUTANEOUS | Status: DC
  Administered 2013-01-22: 25 [IU] via SUBCUTANEOUS
  Filled 2013-01-22 (×2): qty 0.25

## 2013-01-22 MED ORDER — CLONIDINE HCL 0.1 MG PO TABS
0.1000 mg | ORAL_TABLET | Freq: Two times a day (BID) | ORAL | Status: DC
  Administered 2013-01-22 – 2013-01-24 (×4): 0.1 mg via ORAL
  Filled 2013-01-22 (×6): qty 1

## 2013-01-22 MED ORDER — IPRATROPIUM-ALBUTEROL 0.5-2.5 (3) MG/3ML IN SOLN
3.0000 mL | Freq: Four times a day (QID) | RESPIRATORY_TRACT | Status: DC
  Administered 2013-01-23 – 2013-01-24 (×5): 3 mL via RESPIRATORY_TRACT
  Filled 2013-01-22 (×15): qty 3

## 2013-01-22 MED ORDER — INSULIN ASPART 100 UNIT/ML ~~LOC~~ SOLN
6.0000 [IU] | Freq: Three times a day (TID) | SUBCUTANEOUS | Status: DC
  Administered 2013-01-22 – 2013-01-23 (×6): 6 [IU] via SUBCUTANEOUS

## 2013-01-22 MED ORDER — PREDNISONE 20 MG PO TABS
40.0000 mg | ORAL_TABLET | Freq: Every day | ORAL | Status: DC
  Administered 2013-01-23 – 2013-01-24 (×2): 40 mg via ORAL
  Filled 2013-01-22 (×3): qty 2

## 2013-01-22 NOTE — Progress Notes (Signed)
[facility] PROGRESS NOTE  Laura Best [id number] DOB: 08/02/1940 DOA: 01/19/2013 PCP: [name], PA-C  72 yo female with ho diastolic heart failure, DM, HTN, HLD, CKD 3 admitted to [facility] with chest [facility].  Found to have elevated troponin and acute on chronic heart failure.  Transferred to [facility].  Assessment/Plan:  Non Stemi  Acute on chronic diastolic heart failure  Lasix held due to ARF  Acute renal failure due to diuresis. Lasix held  Hyperkalemia: monitor. Insulin will help  Acute on CKD stage 3.     Acute bronchitis with COPD exacerbation Less wheeze today.  DM II Still uncontrolled. Increase insulin  Anemia: No bleeding noted. No stool since Monday. Follow. Transfuse PRN. Heparin gtt stopped days ago  HTN: decrease clonidine further.  Relative hypotension likely contributing to ARF  Code Status: full Family Communication: patient alert and orientated. Disposition Plan:  inpatient   Consultants:  cards  Antibiotics:  DOXYCYCLINE  HPI/Subjective: Feeling better daily  Objective: Filed Vitals:   01/22/13 0504 01/22/13 0928 01/22/13 1056 01/22/13 1447  BP: 120/46  126/64 111/54  Pulse: 53   65  Temp: 98.8 F (37.1 C)   99.2 F (37.3 C)  TempSrc: Oral   Oral  Resp: 20   18  Height:      Weight: 70.716 kg (155 lb 14.4 oz)     SpO2: 98% 97%  97%    Intake/Output Summary (Last 24 hours) at 01/22/13 1650 Last data filed at 01/22/13 1300  Gross per 24 hour  Intake    840 ml  Output   1450 ml  Net   -610 ml   Filed Weights   01/20/13 0035 01/21/13 0500 01/22/13 0504  Weight: 67.9 kg (149 lb 11.1 oz) 68.6 kg (151 lb 3.8 oz) 70.716 kg (155 lb 14.4 oz)    Exam: General: less cough todqay Neck: Supple, no masses  Cardiovascular: RRR. slow Respiratory: less wheeze Abdomen: Soft, nontender, nondistended, + bowel sounds  Extremities: warm dry without cyanosis clubbing or edema.    Data Reviewed: Basic Metabolic Panel:  Recent  Labs Lab 01/19/13 0157 01/19/13 0858 01/20/13 0840 01/21/13 0550 01/22/13 1043  NA 137  --  132* 133* 131*  K 3.8  --  4.5 5.2 5.5*  CL 101  --  95* 96 94*  CO2 24  --  25 23 21   GLUCOSE 265*  --  400* 302* 343*  BUN 22  --  32* 52* 75*  CREATININE 1.45*  --  1.91* 2.65* 2.57*  CALCIUM 9.4  --  8.0* 7.3* 7.1*  MG  --  1.7 1.7  --   --   PHOS  --  4.1  --   --   --    CBC:  Recent Labs Lab 01/19/13 0157 01/20/13 0840 01/21/13 0550 01/22/13 0540  WBC 9.1 6.5 12.4* 9.1  NEUTROABS 7.6  --   --   --   HGB 9.0* 8.8* 8.0* 7.9*  HCT 27.6* 26.0* 24.6* 24.1*  MCV 86.5 84.4 86.0 85.5  PLT 273 258 246 222   Cardiac Enzymes:  Recent Labs Lab 01/19/13 0157 01/19/13 1835 01/19/13 2054 01/20/13 0840  TROPONINI <0.30 2.37* 1.97* 0.85*   BNP (last 3 results)  Recent Labs  01/19/13 0157  PROBNP 10409.0*   CBG:  Recent Labs Lab 01/21/13 1705 01/21/13 2100 01/22/13 0608 01/22/13 1114 01/22/13 1621  GLUCAP 174* 224* 378* 338* 316*      Studies: No results found.  Scheduled Meds: . amLODipine  10 mg Oral Daily  . aspirin EC  81 mg Oral Daily  . cholecalciferol  4,000 Units Oral Daily  . cloNIDine  0.2 mg Oral BID  . dextromethorphan-guaiFENesin  1 tablet Oral BID  . doxycycline  100 mg Oral Q12H  . insulin aspart  0-15 Units Subcutaneous TID WC  . insulin aspart  6 Units Subcutaneous TID WC  . insulin glargine  25 Units Subcutaneous Daily  . ipratropium-albuterol  3 mL Nebulization Q4H  . pantoprazole  40 mg Oral Daily  . predniSONE  40 mg Oral BID WC  . simvastatin  20 mg Oral QHS  . sodium chloride  3 mL Intravenous Q12H   Continuous Infusions:  Delfina Redwood,   [facility] Pager [phone]. If 7PM-7AM, please contact night-coverage at [email or web address], password [facility] 01/22/2013, 4:50 PM  LOS: 3 days

## 2013-01-22 NOTE — Progress Notes (Signed)
Patient Name: Laura Best Date of Encounter: 01/22/2013   Principal Problem:   Acute diastolic CHF (congestive heart failure) Active Problems:   Diabetes type 2, uncontrolled   COPD exacerbation   CKD (chronic kidney disease) stage 3, GFR 30-59 ml/min   Acute bronchitis   Acute renal failure   Anemia of chronic disease    SUBJECTIVE: Breathing improved.  She still has congestion/ cough. Denies CP  OBJECTIVE  Filed Vitals:   01/21/13 2057 01/21/13 2101 01/21/13 2255 01/22/13 0504  BP:  110/40 109/51 120/46  Pulse:  50  53  Temp:  98.9 F (37.2 C)  98.8 F (37.1 C)  TempSrc:  Oral  Oral  Resp:  18  20  Height:      Weight:    155 lb 14.4 oz (70.716 kg)  SpO2: 100% 100%  98%    Intake/Output Summary (Last 24 hours) at 01/22/13 [id number] Last data filed at 01/22/13 [id number]  Gross per 24 hour  Intake   1080 ml  Output   1150 ml  Net    -70 ml   Weight change: 4 lb 10.6 oz (2.116 kg)  PHYSICAL EXAM  General: Appears older than stated age, well nourished, in no acute distress. Head: Normocephalic, atraumatic, sclera non-icteric, no xanthomas, nares are without discharge.  Neck: Supple without bruits. JVP 7cm. Lungs:  Very coarse BS (R>L) with prolonged expiratory phase, no rales Heart: RRR no s3, s4, or murmurs. Abdomen: Soft, non-tender, distended, hypoactive BS + x 4.  Msk:  Strength and tone appears normal for age. Extremities: No clubbing, cyanosis or edema. DP/PT/Radials 2+ and equal bilaterally. Neuro: Alert and oriented X 3. Moves all extremities spontaneously. Psych: Normal affect.  LABS:  Recent Labs     01/20/13  0840  01/21/13  0550  WBC  6.5  12.4*  HGB  8.8*  8.0*  HCT  26.0*  24.6*  MCV  84.4  86.0  PLT  258  246    Recent Labs  01/19/13 0830  VITAMINB12 383  FOLATE >20.0  FERRITIN 301*  TIBC 233*  IRON 25*  RETICCTPCT 1.7    Recent Labs Lab 01/19/13 0157 01/20/13 0840 01/21/13 0550  NA 137 132* 133*  K 3.8 4.5 5.2  CL 101 95*  96  CO2 24 25 23   BUN 22 32* 52*  CREATININE 1.45* 1.91* 2.65*  CALCIUM 9.4 8.0* 7.3*  GLUCOSE 265* 400* 302*   Recent Labs     01/19/13  0858  HGBA1C  7.4*   Recent Labs     01/19/13  1835  01/19/13  2054  01/20/13  0840  TROPONINI  2.37*  1.97*  0.85*    Recent Labs  01/19/13 0858  TSH 1.316    TELE: NSR, 60-70 bpm  Radiology/Studies:  Ct Abdomen Pelvis Wo Contrast  12/31/2012   CLINICAL DATA:  History of para appendiceal abscess with placement of percutaneous drain.  EXAM: CT ABDOMEN AND PELVIS WITHOUT CONTRAST  TECHNIQUE: Multidetector CT imaging of the abdomen and pelvis was performed following the standard protocol without intravenous contrast.  COMPARISON:  CT abdomen and pelvis 08/01/2012.  FINDINGS: Heart size is upper normal. No pleural or pericardial effusion. There is some scarring in the right lung base.  Drainage catheter in the left lower quadrant seen on the prior study has been removed. There is no fluid collection in the abdomen or pelvis. A large volume of stool is seen throughout the colon. The colon is  otherwise unremarkable. The appendix has been removed. Small hiatal hernia is noted. The stomach is otherwise unremarkable. Small bowel appears normal.  The liver, gallbladder, spleen, adrenal glands, pancreas and kidneys appear normal. Extensive aortoiliac atherosclerosis without aneurysm is noted. There is no lymphadenopathy or fluid. No lytic or sclerotic bony lesion is identified.  IMPRESSION: No acute finding. No evidence of complication related to periappendiceal abscess.  Atherosclerosis.  Small hiatal hernia.   Electronically Signed   By: Inge Rise M.D.   On: 12/31/2012 16:10   Dg Chest Port 1 View  01/19/2013   CLINICAL DATA:  Chest pains, shortness of breath  EXAM: PORTABLE CHEST - 1 VIEW  COMPARISON:  07/19/2012  FINDINGS: New bilateral interstitial opacities diffusely with small effusions. Heart is enlarged. Finds compatible with CHF. Prior  median sternotomy wires noted. No pneumothorax. Degenerative changes of the shoulders and spine.  IMPRESSION: Moderate CHF pattern   Electronically Signed   By: Daryll Brod M.D.   On: 01/19/2013 01:23    Current Medications:  . amLODipine  10 mg Oral Daily  . aspirin EC  81 mg Oral Daily  . cholecalciferol  4,000 Units Oral Daily  . cloNIDine  0.2 mg Oral BID  . dextromethorphan-guaiFENesin  1 tablet Oral BID  . doxycycline  100 mg Oral Q12H  . insulin aspart  0-15 Units Subcutaneous TID WC  . insulin glargine  22 Units Subcutaneous Daily  . ipratropium-albuterol  3 mL Nebulization Q4H  . pantoprazole  40 mg Oral Daily  . predniSONE  40 mg Oral BID WC  . simvastatin  20 mg Oral QHS  . sodium chloride  3 mL Intravenous Q12H    ASSESSMENT AND PLAN:  Todays labs are pending.   1. Acute hypoxic respiratory failure- secondary to #2, #3    2. Acute on chronic diastolic CHF-  She is dry.  Lasix has been held.  Continue to follow closely.  Encourage po hydration again today.  3. Acute COPD exacerbation- cough productive of green sputum last week.  I think that this is her primary pulmonary issue presently. -- Management per primary team.   4. NSTEMI-  LIkely due to "demand" ischemia.  My suspicion for an acute thrombosis is low.    Given renal failure, she is not a candidate for cath at this time. Continue current medical management.   -- Borderline bradycardia precludes BB therapy May consider nuclear stress testing once clinically improved (outpatient).  5. Pleuritic chest pain- in the setting of COPD exacerbation (cough).  -- Supportive management.  6. QT prolongation Stable.  avoid Qt prolonging drugs including antibiotics if able.     Thompson Grayer, MD 01/22/2013 6:20 AM

## 2013-01-22 NOTE — Progress Notes (Addendum)
Inpatient Diabetes Program Recommendations  AACE/ADA: New Consensus Statement on Inpatient Glycemic Control (2013)  Target Ranges:  Prepandial:   less than 140 mg/dL      Peak postprandial:   less than 180 mg/dL (1-2 hours)      Critically ill patients:  140 - 180 mg/dL   Hyperglycemia: has been on Solumedrol, now on Prednisone. Solumedrol typically requires an increase in basal insulin Prednisone primarily affects the meal coverage, the pre-meal cbg  Noted addition of meal coverage as well as an increase in Lantus, thank you.  Pt may also need the HS coverage. Thank you, Rosita Kea, RN, CNS, Diabetes Coordinator [phone])

## 2013-01-23 LAB — BASIC METABOLIC PANEL
BUN: 83 mg/dL — ABNORMAL HIGH (ref 6–23)
CO2: 20 mEq/L (ref 19–32)
Calcium: 7.2 mg/dL — ABNORMAL LOW (ref 8.4–10.5)
Chloride: 98 mEq/L (ref 96–112)
Creatinine, Ser: 2.3 mg/dL — ABNORMAL HIGH (ref 0.50–1.10)
GFR calc Af Amer: 23 mL/min — ABNORMAL LOW (ref >90)
GFR calc non Af Amer: 20 mL/min — ABNORMAL LOW (ref >90)
Glucose, Bld: 216 mg/dL — ABNORMAL HIGH (ref 70–99)
Potassium: 5 mEq/L (ref 3.7–5.3)
Sodium: 133 mEq/L — ABNORMAL LOW (ref 137–147)

## 2013-01-23 LAB — CBC
HCT: 25.1 % — ABNORMAL LOW (ref 36.0–46.0)
Hemoglobin: 8.3 g/dL — ABNORMAL LOW (ref 12.0–15.0)
MCH: 27.9 pg (ref 26.0–34.0)
MCHC: 33.1 g/dL (ref 30.0–36.0)
MCV: 84.5 fL (ref 78.0–100.0)
Platelets: 234 10*3/uL (ref 150–400)
RBC: 2.97 MIL/uL — ABNORMAL LOW (ref 3.87–5.11)
RDW: 14.3 % (ref 11.5–15.5)
WBC: 11.1 10*3/uL — ABNORMAL HIGH (ref 4.0–10.5)

## 2013-01-23 LAB — GLUCOSE, CAPILLARY
Glucose-Capillary: 212 mg/dL — ABNORMAL HIGH (ref 70–99)
Glucose-Capillary: 184 mg/dL — ABNORMAL HIGH (ref 70–99)
Glucose-Capillary: 174 mg/dL — ABNORMAL HIGH (ref 70–99)
Glucose-Capillary: 213 mg/dL — ABNORMAL HIGH (ref 70–99)
Glucose-Capillary: 125 mg/dL — ABNORMAL HIGH (ref 70–99)

## 2013-01-23 LAB — ABO/RH: ABO/RH(D): O POS

## 2013-01-23 LAB — TYPE AND SCREEN
ABO/RH(D): O POS
Antibody Screen: NEGATIVE

## 2013-01-23 MED ORDER — INSULIN ASPART 100 UNIT/ML ~~LOC~~ SOLN: 8.0000 [IU] | Freq: Three times a day (TID) | SUBCUTANEOUS | Status: DC

## 2013-01-23 MED ORDER — INSULIN GLARGINE 100 UNIT/ML ~~LOC~~ SOLN
30.0000 [IU] | Freq: Every day | SUBCUTANEOUS | Status: DC
  Administered 2013-01-23: 30 [IU] via SUBCUTANEOUS
  Filled 2013-01-23: qty 0.3

## 2013-01-23 MED ORDER — INSULIN GLARGINE 100 UNIT/ML ~~LOC~~ SOLN
35.0000 [IU] | Freq: Every day | SUBCUTANEOUS | Status: DC
  Administered 2013-01-24: 35 [IU] via SUBCUTANEOUS
  Filled 2013-01-23: qty 0.35

## 2013-01-23 NOTE — Progress Notes (Signed)
[facility] PROGRESS NOTE  Laura Best [id number] DOB: Jul 20, 1940 DOA: 01/19/2013 PCP: [name], PA-C  73 yo female with ho diastolic heart failure, DM, HTN, HLD, CKD 3 admitted to [facility] with [facility].  Found to have elevated troponin and acute on chronic heart failure.  Transferred to [facility].  Assessment/Plan:  Non Stemi  Acute on chronic diastolic heart failure  Lasix held due to ARF  Acute renal failure due to diuresis. Lasix held  Hyperkalemia: resolved  Acute on CKD stage 3.     Acute bronchitis with COPD exacerbation Less wheeze today. Taper steroids  DM II Moderate control  Anemia: No bleeding noted. nonbloody stool today. i don't think it was collected for hemoccult.  hgb stabilized  HTN: controlled  Home 1-2 days if continues to improve.  Code Status: full Family Communication: patient alert and orientated. Disposition Plan:  inpatient   Consultants:  cards  Antibiotics:  DOXYCYCLINE  HPI/Subjective: Feeling better daily. Had 3 normal stools today.  Objective: Filed Vitals:   01/23/13 0931 01/23/13 1350 01/23/13 1358 01/23/13 1603  BP:  135/52    Pulse: 80 63 65 67  Temp:  98 F (36.7 C)    TempSrc:  Oral    Resp: 18 18 16 16   Height:      Weight:      SpO2:   98% 96%    Intake/Output Summary (Last 24 hours) at 01/23/13 2010 Last data filed at 01/23/13 1900  Gross per 24 hour  Intake    720 ml  Output   2002 ml  Net  -1282 ml   Filed Weights   01/21/13 0500 01/22/13 0504 01/23/13 0606  Weight: 68.6 kg (151 lb 3.8 oz) 70.716 kg (155 lb 14.4 oz) 71.215 kg (157 lb)    Exam: General: comfortable Neck: Supple, no masses  Cardiovascular: RRR. slow Respiratory: minimal wheeze. No RR Abdomen: Soft, nontender, nondistended, + bowel sounds  Extremities: warm dry without cyanosis clubbing or edema.    Data Reviewed: Basic Metabolic Panel:  Recent Labs Lab 01/19/13 0157 01/19/13 0858 01/20/13 0840 01/21/13 0550  01/22/13 1043 01/23/13 0525  NA 137  --  132* 133* 131* 133*  K 3.8  --  4.5 5.2 5.5* 5.0  CL 101  --  95* 96 94* 98  CO2 24  --  25 23 21 20   GLUCOSE 265*  --  400* 302* 343* 216*  BUN 22  --  32* 52* 75* 83*  CREATININE 1.45*  --  1.91* 2.65* 2.57* 2.30*  CALCIUM 9.4  --  8.0* 7.3* 7.1* 7.2*  MG  --  1.7 1.7  --   --   --   PHOS  --  4.1  --   --   --   --    CBC:  Recent Labs Lab 01/19/13 0157 01/20/13 0840 01/21/13 0550 01/22/13 0540 01/23/13 0525  WBC 9.1 6.5 12.4* 9.1 11.1*  NEUTROABS 7.6  --   --   --   --   HGB 9.0* 8.8* 8.0* 7.9* 8.3*  HCT 27.6* 26.0* 24.6* 24.1* 25.1*  MCV 86.5 84.4 86.0 85.5 84.5  PLT 273 258 246 222 234   Cardiac Enzymes:  Recent Labs Lab 01/19/13 0157 01/19/13 1835 01/19/13 2054 01/20/13 0840  TROPONINI <0.30 2.37* 1.97* 0.85*   BNP (last 3 results)  Recent Labs  01/19/13 0157  PROBNP 10409.0*   CBG:  Recent Labs Lab 01/22/13 1621 01/22/13 2201 01/23/13 0627 01/23/13 1136 01/23/13  [place]      Studies: No results found.  Scheduled Meds: . amLODipine  10 mg Oral Daily  . aspirin EC  81 mg Oral Daily  . cholecalciferol  4,000 Units Oral Daily  . cloNIDine  0.1 mg Oral BID  . dextromethorphan-guaiFENesin  1 tablet Oral BID  . doxycycline  100 mg Oral Q12H  . insulin aspart  0-15 Units Subcutaneous TID WC  . insulin aspart  6 Units Subcutaneous TID WC  . insulin glargine  30 Units Subcutaneous Daily  . ipratropium-albuterol  3 mL Nebulization QID  . pantoprazole  40 mg Oral Daily  . predniSONE  40 mg Oral Q breakfast  . simvastatin  20 mg Oral QHS  . sodium chloride  3 mL Intravenous Q12H   Continuous Infusions:  Delfina Redwood,   [facility] Pager [phone]. If 7PM-7AM, please contact night-coverage at [email or web address], password [facility] 01/23/2013, 8:10 PM  LOS: 4 days

## 2013-01-23 NOTE — Progress Notes (Signed)
Primary cardiologist: Former patient of Dr. Jacqulyn Ducking  Subjective:    Sitting up in bed. Still with cough, but breathing generally better. No chest pain symptoms or palpitations.  Objective:   Temp:  [98.3 F (36.8 C)-99.2 F (37.3 C)] 98.5 F (36.9 C) (01/01 0606) Pulse Rate:  [63-65] 65 (01/01 0606) Resp:  [16-18] 18 (01/01 0606) BP: (111-139)/(46-64) 139/53 mmHg (01/01 0606) SpO2:  [96 %-100 %] 96 % (01/01 0606) Weight:  [157 lb (71.215 kg)] 157 lb (71.215 kg) (01/01 0606) Last BM Date: 01/20/13  Filed Weights   01/21/13 0500 01/22/13 0504 01/23/13 0606  Weight: 151 lb 3.8 oz (68.6 kg) 155 lb 14.4 oz (70.716 kg) 157 lb (71.215 kg)    Intake/Output Summary (Last 24 hours) at 01/23/13 0833 Last data filed at 01/23/13 0608  Gross per 24 hour  Intake    960 ml  Output   1850 ml  Net   -890 ml    Telemetry: Sinus rhythm.  Exam:  General: No distress.  Lungs: Course breath sounds with end expiratory wheezes and scattered rhonchi.  Cardiac: Regular rate and rhythm with ectopic beats and 2/6 systolic murmur.  Extremities: No pitting edema.  Lab Results:  Basic Metabolic Panel:  Recent Labs Lab 01/19/13 0858 01/20/13 0840 01/21/13 0550 01/22/13 1043 01/23/13 0525  NA  --  132* 133* 131* 133*  K  --  4.5 5.2 5.5* 5.0  CL  --  95* 96 94* 98  CO2  --  25 23 21 20   GLUCOSE  --  400* 302* 343* 216*  BUN  --  32* 52* 75* 83*  CREATININE  --  1.91* 2.65* 2.57* 2.30*  CALCIUM  --  8.0* 7.3* 7.1* 7.2*  MG 1.7 1.7  --   --   --     CBC:  Recent Labs Lab 01/21/13 0550 01/22/13 0540 01/23/13 0525  WBC 12.4* 9.1 11.1*  HGB 8.0* 7.9* 8.3*  HCT 24.6* 24.1* 25.1*  MCV 86.0 85.5 84.5  PLT 246 222 234    Cardiac Enzymes:  Recent Labs Lab 01/19/13 1835 01/19/13 2054 01/20/13 0840  TROPONINI 2.37* 1.97* 0.85*    BNP:  Recent Labs  01/19/13 0157  PROBNP 10409.0*    Echocardiogram: Study Conclusions  - Left ventricle: The cavity size  was normal. Wall thickness was increased in a pattern of mild LVH. The estimated ejection fraction was 55%. Wall motion was normal; there were no regional wall motion abnormalities. Features are consistent with a pseudonormal left ventricular filling pattern, with concomitant abnormal relaxation and increased filling pressure (grade 2 diastolic dysfunction). E/medial e' > 15 suggests LV end diastolic pressure at least 20 mmHg. - Ventricular septum: Mild D shape to the interventricular septum suggesting RV pressure/volume overload. - Aortic valve: There was no stenosis. - Mitral valve: Mildly calcified annulus. Mildly calcified leaflets . Trivial regurgitation. - Left atrium: The atrium was moderately dilated. - Right ventricle: The cavity size was mildly dilated. Systolic function was mildly reduced. - Right atrium: The atrium was mildly dilated. - Pulmonary arteries: PA peak pressure: 16mm Hg (S). - Systemic veins: IVC measured 2.3 cm with some respirophasic variation suggesting RA pressure 10 mmHg. Impressions:  - Normal LV size with mild LV hypertrophy. EF 55%. Moderate diastolic dysfunction. Mildly D shaped interventricular septum suggesting RV pressure/volume overload. Mildly dilated RV with mildly decreased systolic function. Mild pulmonary hypertension.   Medications:   Scheduled Medications: . amLODipine  10 mg Oral Daily  .  aspirin EC  81 mg Oral Daily  . cholecalciferol  4,000 Units Oral Daily  . cloNIDine  0.1 mg Oral BID  . dextromethorphan-guaiFENesin  1 tablet Oral BID  . doxycycline  100 mg Oral Q12H  . insulin aspart  0-15 Units Subcutaneous TID WC  . insulin aspart  6 Units Subcutaneous TID WC  . insulin glargine  30 Units Subcutaneous Daily  . ipratropium-albuterol  3 mL Nebulization QID  . pantoprazole  40 mg Oral Daily  . predniSONE  40 mg Oral Q breakfast  . simvastatin  20 mg Oral QHS  . sodium chloride  3 mL Intravenous Q12H      PRN  Medications:  acetaminophen, albuterol, bisacodyl, ondansetron (ZOFRAN) IV, polyethylene glycol, promethazine   Assessment:   1. NSTEMI, possibly type II event. Plan is for medical therapy at this point in the setting of renal insufficiency. LVEF normal by echocardiogram without wall motion abnormalities.  2. Acute on chronic diastolic heart failure, elevated filling pressures by echocardiogram.  3. COPD exacerbation.  4. CKD, stage 3, creatinine 2.3 down from 2.5 in the setting of acute exacerbation.  5. Anemia of chronic disease.   Plan/Discussion:    Current cardiac regimen includes aspirin, Norvasc, Catapres, and Zocor. Lasix has been on hold with acute on chronic renal failure. Eventually she may need to resume low-dose Lasix. She had also previously been on ACE inhibitor which is now held in the acute setting. No further inpatient cardiac testing is planned at this time. When she is ready for discharge, please make a followup visit to establish with either Dr. Bronson Ing or Dr. Harl Bowie in the [place] office (former patient of Dr. Lattie Haw). Consideration can be given to arrange an outpatient Cardiolite to assess ischemic burden, but would tend to avoid coronary angiography unless symptoms worsen or there are high risk features.   Satira Sark, M.D., F.A.C.C.

## 2013-01-23 NOTE — Progress Notes (Signed)
Foley removed by RN, pt tolerated well. Pt no longer being diuresed. Pt to void by 1215.  Will report to day shift nurse. Laura Best

## 2013-01-24 DIAGNOSIS — D638 Anemia in other chronic diseases classified elsewhere: Secondary | ICD-10-CM

## 2013-01-24 LAB — CBC
HEMATOCRIT: 27 % — AB (ref 36.0–46.0)
HEMOGLOBIN: 9 g/dL — AB (ref 12.0–15.0)
MCH: 28.3 pg (ref 26.0–34.0)
MCHC: 33.3 g/dL (ref 30.0–36.0)
MCV: 84.9 fL (ref 78.0–100.0)
Platelets: 300 10*3/uL (ref 150–400)
RBC: 3.18 MIL/uL — ABNORMAL LOW (ref 3.87–5.11)
RDW: 14.6 % (ref 11.5–15.5)
WBC: 15.9 10*3/uL — AB (ref 4.0–10.5)

## 2013-01-24 LAB — BASIC METABOLIC PANEL
BUN: 77 mg/dL — AB (ref 6–23)
CHLORIDE: 101 meq/L (ref 96–112)
CO2: 24 mEq/L (ref 19–32)
Calcium: 7.8 mg/dL — ABNORMAL LOW (ref 8.4–10.5)
Creatinine, Ser: 2.07 mg/dL — ABNORMAL HIGH (ref 0.50–1.10)
GFR calc Af Amer: 26 mL/min — ABNORMAL LOW (ref >90)
GFR calc non Af Amer: 23 mL/min — ABNORMAL LOW (ref >90)
GLUCOSE: 57 mg/dL — AB (ref 70–99)
Potassium: 4.6 mEq/L (ref 3.7–5.3)
Sodium: 138 mEq/L (ref 137–147)

## 2013-01-24 LAB — GLUCOSE, CAPILLARY
Glucose-Capillary: 65 mg/dL — ABNORMAL LOW (ref 70–99)
Glucose-Capillary: 334 mg/dL — ABNORMAL HIGH (ref 70–99)

## 2013-01-24 MED ORDER — FUROSEMIDE 40 MG PO TABS: 40.0000 mg | ORAL_TABLET | Freq: Every day | ORAL | Status: DC

## 2013-01-24 MED ORDER — DM-GUAIFENESIN ER 30-600 MG PO TB12: 1.0000 | ORAL_TABLET | Freq: Two times a day (BID) | ORAL | Status: DC | PRN

## 2013-01-24 MED ORDER — FUROSEMIDE 40 MG PO TABS
40.0000 mg | ORAL_TABLET | Freq: Once | ORAL | Status: AC
  Administered 2013-01-24: 40 mg via ORAL
  Filled 2013-01-24: qty 1

## 2013-01-24 MED ORDER — CLONIDINE HCL 0.1 MG PO TABS
0.1000 mg | ORAL_TABLET | Freq: Once | ORAL | Status: AC
  Administered 2013-01-24: 0.1 mg via ORAL
  Filled 2013-01-24: qty 1

## 2013-01-24 MED ORDER — PREDNISONE 10 MG PO TABS: ORAL_TABLET | ORAL | Status: DC

## 2013-01-24 MED ORDER — IPRATROPIUM-ALBUTEROL 0.5-2.5 (3) MG/3ML IN SOLN: 3.0000 mL | RESPIRATORY_TRACT | Status: DC | PRN

## 2013-01-24 MED ORDER — ASPIRIN 81 MG PO TBEC: 81.0000 mg | DELAYED_RELEASE_TABLET | Freq: Every day | ORAL | Status: DC

## 2013-01-24 MED ORDER — DOXYCYCLINE HYCLATE 100 MG PO TABS: 100.0000 mg | ORAL_TABLET | Freq: Two times a day (BID) | ORAL | Status: DC

## 2013-01-24 MED ORDER — IPRATROPIUM-ALBUTEROL 18-103 MCG/ACT IN AERO: 2.0000 | INHALATION_SPRAY | Freq: Four times a day (QID) | RESPIRATORY_TRACT | Status: DC

## 2013-01-24 NOTE — Care Management Note (Signed)
    Page 1 of 2   01/24/2013     3:59:44 PM   CARE MANAGEMENT NOTE 01/24/2013  Patient:  Laura Best, Laura Best   Account Number:  [id number]  Date Initiated:  01/24/2013  Documentation initiated by:  AMERSON,JULIE  Subjective/Objective Assessment:   PT ADM ON 12/28 WITH ACUTE DIASTOLIC CHF EXACERBATION. PTA, PT INDEPENDENT, LIVES ALONE.  HAS SUPPORTIVE SON.     Action/Plan:   PT FOR DC HOME TODAY; NEEDS HOME O2 AS CONT TO DESAT WITH AMBULATION.  WILL ALSO NEED HH FOLLOW UP.   Anticipated DC Date:  01/24/2013   Anticipated DC Plan:  [place]  CM consult      [facility] Choice  HOME HEALTH   Choice offered to / List presented to:  C-1 Patient   DME arranged  [place]      DME agency  [place] arranged  [place]      [place].   Status of service:  Completed, signed off Medicare Important Message given?   (If response is "NO", the following Medicare IM given date fields will be blank) Date Medicare IM given:   Date Additional Medicare IM given:    Discharge Disposition:  [place]  Per UR Regulation:  Reviewed for med. necessity/level of care/duration of stay  If discussed at [place] of Stay Meetings, dates discussed:    Comments:  01/25/12 JULIE AMERSON,RN,BSN [id number] REFERRAL TO [place] AND DME NEEDS, PER PT CHOICE.  START OF CARE FOR HH, 24-48H POST DC DATE.

## 2013-01-24 NOTE — Discharge Summary (Signed)
Physician Discharge Summary  Laura Best [id number] DOB: Jun 30, 1940 DOA: 01/19/2013  PCP: [name], PA-C  Admit date: 01/19/2013 Discharge date: 01/24/2013  Time spent: greater than 30 minutes  Recommendations for Outpatient Follow-up:  1.   Discharge Diagnoses:  Principal Problem:   Acute diastolic CHF (congestive heart failure) Active Problems:   Diabetes type 2, uncontrolled   COPD exacerbation   CKD (chronic kidney disease) stage 3, GFR 30-59 ml/min   Acute bronchitis   Acute renal failure   Anemia of chronic disease   Hyperkalemia   Discharge Condition:  Filed Weights   01/22/13 0504 01/23/13 0606 01/24/13 0500  Weight: 70.716 kg (155 lb 14.4 oz) 71.215 kg (157 lb) 70.9 kg (156 lb 4.9 oz)    History of present illness:  73 y.o. female  This patient was seen in emergency room today for increasing shortness of breath. Increasing shortness of breath started yesterday and got worse today. She was unable to lay down at all last night even to her usual 4 pillow orthopnea. She does have a history of diastolic congestive heart failure.  Hospital Course:  Initially admitted to [facility]. Ruled in for MI, so transferred to [facility] per cardiology recommendations. MI felt to be due to demand ischemia.  Upon transfer, pt noted to have significant wheeze. likley mainly pulmonary (acute bronchitis with COPD exacerbation). Antibiotics, steroids, breathing treatments.  Diuretics continued. Echo showed preserved EF, no wall motion abnormalities.  Grade 2 diastolic dysfunction. Cardiology felt outpt stress myoview in order. By discharge, feeling better, but still requiring oxygen.  Home oxygen, PT, RN arranged.  Procedures:  none  Consultations:  cardiology  Discharge Exam: Filed Vitals:   01/24/13 0500  BP: 180/67  Pulse: 69  Temp: 98 F (36.7 C)  Resp: 18    General: comfortable Cardiovascular: RRR Respiratory: mild wheeze. Good air movement Ext no  edema  Discharge Instructions   Future Appointments Provider Department Dept Phone   02/03/2013 1:20 PM [facility] [place]  IMAGING [phone]   Please wear two piece clothing and wear no powder or deodorant. Please arrive 15 minutes early prior to your appointment time.       Medication List    STOP taking these medications       lisinopril 20 MG tablet  Commonly known as:  PRINIVIL,ZESTRIL      TAKE these medications       acetaminophen 500 MG tablet  Commonly known as:  TYLENOL  Take 500 mg by mouth every 6 (six) hours as needed for pain.     albuterol-ipratropium 18-103 MCG/ACT inhaler  Commonly known as:  COMBIVENT  Inhale 2 puffs into the lungs 4 (four) times daily.     alendronate 70 MG tablet  Commonly known as:  FOSAMAX  Take 1 tablet (70 mg total) by mouth every 7 (seven) days. Take with a full glass of water on an empty stomach.     amLODipine 10 MG tablet  Commonly known as:  NORVASC  Take 10 mg by mouth daily.     aspirin 81 MG EC tablet  Take 1 tablet (81 mg total) by mouth daily.     cholecalciferol 1000 UNITS tablet  Commonly known as:  VITAMIN D  Take 4,000 Units by mouth daily.     cloNIDine 0.3 MG tablet  Commonly known as:  CATAPRES  Take 1 tablet (0.3 mg total) by mouth 2 (two) times daily.     dextromethorphan-guaiFENesin  30-600 MG per 12 hr tablet  Commonly known as:  MUCINEX DM  Take 1 tablet by mouth 2 (two) times daily as needed for cough.     doxycycline 100 MG tablet  Commonly known as:  VIBRA-TABS  Take 1 tablet (100 mg total) by mouth every 12 (twelve) hours.     insulin glargine 100 UNIT/ML injection  Commonly known as:  LANTUS  Inject 15 Units into the skin every morning.     predniSONE 10 MG tablet  Commonly known as:  DELTASONE  4 tablets daily for 2 days, then decrease by 1 tablet every 2 days until off     simvastatin 20 MG tablet  Commonly known as:  ZOCOR  Take 1 tablet (20 mg total) by mouth  at bedtime.       Allergies  Allergen Reactions  . Aspirin Other (See Comments)    G.I. Upset.   . Metformin And Related Diarrhea       Follow-up Information   Follow up with [facility] BETH, PA-C.   Specialty:  Physician Assistant   Contact information:   [place] [address] [phone]       Follow up with Carlyle Dolly, F, MD In 4 weeks.   Specialty:  Cardiology   Contact information:   [address] [place] [address] [phone]        The results of significant diagnostics from this hospitalization (including imaging, microbiology, ancillary and laboratory) are listed below for reference.    Significant Diagnostic Studies: Ct Abdomen Pelvis Wo Contrast  12/31/2012   CLINICAL DATA:  History of para appendiceal abscess with placement of percutaneous drain.  EXAM: CT ABDOMEN AND PELVIS WITHOUT CONTRAST  TECHNIQUE: Multidetector CT imaging of the abdomen and pelvis was performed following the standard protocol without intravenous contrast.  COMPARISON:  CT abdomen and pelvis 08/01/2012.  FINDINGS: Heart size is upper normal. No pleural or pericardial effusion. There is some scarring in the right lung base.  Drainage catheter in the left lower quadrant seen on the prior study has been removed. There is no fluid collection in the abdomen or pelvis. A large volume of stool is seen throughout the colon. The colon is otherwise unremarkable. The appendix has been removed. Small hiatal hernia is noted. The stomach is otherwise unremarkable. Small bowel appears normal.  The liver, gallbladder, spleen, adrenal glands, pancreas and kidneys appear normal. Extensive aortoiliac atherosclerosis without aneurysm is noted. There is no lymphadenopathy or fluid. No lytic or sclerotic bony lesion is identified.  IMPRESSION: No acute finding. No evidence of complication related to periappendiceal abscess.  Atherosclerosis.  Small hiatal hernia.   Electronically  Signed   By: Inge Rise M.D.   On: 12/31/2012 16:10   Dg Chest Port 1 View  01/19/2013   CLINICAL DATA:  Chest pains, shortness of breath  EXAM: PORTABLE CHEST - 1 VIEW  COMPARISON:  07/19/2012  FINDINGS: New bilateral interstitial opacities diffusely with small effusions. Heart is enlarged. Finds compatible with CHF. Prior median sternotomy wires noted. No pneumothorax. Degenerative changes of the shoulders and spine.  IMPRESSION: Moderate CHF pattern   Electronically Signed   By: Daryll Brod M.D.   On: 01/19/2013 01:23   EKG Sinus rhythm with Premature atrial complexes in a pattern of bigeminy Incomplete left bundle branch block Abnormal QRS-T angle, consider primary T wave abnormality Abnormal ECG  Echo Left ventricle: The cavity size was normal. Wall thickness was increased in  a pattern of mild LVH. The estimated ejection fraction was 55%. Wall motion was normal; there were no regional wall motion abnormalities. Features are consistent with a pseudonormal left ventricular filling pattern, with concomitant abnormal relaxation and increased filling pressure (grade 2 diastolic dysfunction). E/medial e' > 15 suggests LV end diastolic pressure at least 20 mmHg. - Ventricular septum: Mild D shape to the interventricular septum suggesting RV pressure/volume overload. - Aortic valve: There was no stenosis. - Mitral valve: Mildly calcified annulus. Mildly calcified leaflets . Trivial regurgitation. - Left atrium: The atrium was moderately dilated. - Right ventricle: The cavity size was mildly dilated. Systolic function was mildly reduced. - Right atrium: The atrium was mildly dilated. - Pulmonary arteries: PA peak pressure: 19mm Hg (S). - Systemic veins: IVC measured 2.3 cm with some respirophasic variation suggesting RA pressure 10 mmHg. Impressions:  - Normal LV size with mild LV hypertrophy. EF 55%. Moderate diastolic dysfunction. Mildly D shaped interventricular septum  suggesting RV pressure/volume overload. Mildly dilated RV with mildly decreased systolic function. Mild pulmonary hypertension.   Microbiology: No results found for this or any previous visit (from the past 240 hour(s)).   Labs: Basic Metabolic Panel:  Recent Labs Lab 01/19/13 0858 01/20/13 0840 01/21/13 0550 01/22/13 1043 01/23/13 0525 01/24/13 0620  NA  --  132* 133* 131* 133* 138  K  --  4.5 5.2 5.5* 5.0 4.6  CL  --  95* 96 94* 98 101  CO2  --  25 23 21 20 24   GLUCOSE  --  400* 302* 343* 216* 57*  BUN  --  32* 52* 75* 83* 77*  CREATININE  --  1.91* 2.65* 2.57* 2.30* 2.07*  CALCIUM  --  8.0* 7.3* 7.1* 7.2* 7.8*  MG 1.7 1.7  --   --   --   --   PHOS 4.1  --   --   --   --   --    Liver Function Tests: No results found for this basename: AST, ALT, ALKPHOS, BILITOT, PROT, ALBUMIN,  in the last 168 hours No results found for this basename: LIPASE, AMYLASE,  in the last 168 hours No results found for this basename: AMMONIA,  in the last 168 hours CBC:  Recent Labs Lab 01/19/13 0157 01/20/13 0840 01/21/13 0550 01/22/13 0540 01/23/13 0525 01/24/13 0620  WBC 9.1 6.5 12.4* 9.1 11.1* 15.9*  NEUTROABS 7.6  --   --   --   --   --   HGB 9.0* 8.8* 8.0* 7.9* 8.3* 9.0*  HCT 27.6* 26.0* 24.6* 24.1* 25.1* 27.0*  MCV 86.5 84.4 86.0 85.5 84.5 84.9  PLT 273 258 246 222 234 300   Cardiac Enzymes:  Recent Labs Lab 01/19/13 0157 01/19/13 1835 01/19/13 2054 01/20/13 0840  TROPONINI <0.30 2.37* 1.97* 0.85*   BNP: BNP (last 3 results)  Recent Labs  01/19/13 0157  PROBNP 10409.0*   CBG:  Recent Labs Lab 01/23/13 0627 01/23/13 1136 01/23/13 1645 01/23/13 2149 01/24/13 0647  GLUCAP 184* 174* 213* 125* 65*       Signed:  [place] L  [facility] 01/24/2013, 12:57 PM

## 2013-01-24 NOTE — Progress Notes (Signed)
Patient Name: Laura Best Date of Encounter: 01/24/2013   Principal Problem:   Acute diastolic CHF (congestive heart failure) Active Problems:   Diabetes type 2, uncontrolled   COPD exacerbation   CKD (chronic kidney disease) stage 3, GFR 30-59 ml/min   Acute bronchitis   Acute renal failure   Anemia of chronic disease   Hyperkalemia    SUBJECTIVE: Breathing improved.  She still has congestion/ cough. Denies CP  OBJECTIVE  Filed Vitals:   01/23/13 1603 01/23/13 2052 01/23/13 2128 01/24/13 0500  BP:  159/65  180/67  Pulse: 67 75  69  Temp:  98 F (36.7 C)  98 F (36.7 C)  TempSrc:  Oral  Oral  Resp: 16 17  18   Height:      Weight:    156 lb 4.9 oz (70.9 kg)  SpO2: 96% 96% 97% 95%    Intake/Output Summary (Last 24 hours) at 01/24/13 0935 Last data filed at 01/24/13 0850  Gross per 24 hour  Intake    723 ml  Output   1002 ml  Net   -279 ml   Weight change: -11.1 oz (-0.315 kg)  PHYSICAL EXAM  General: Appears older than stated age, well nourished, in no acute distress. Head: Normocephalic, atraumatic, sclera non-icteric, no xanthomas, nares are without discharge.  Neck: Supple without bruits. JVP 7cm. Lungs:  Very coarse BS (R>L) with prolonged expiratory phase, no rales Heart: RRR no s3, s4, or murmurs. Abdomen: Soft, non-tender, distended, hypoactive BS + x 4.  Msk:  Strength and tone appears normal for age. Extremities: No clubbing, cyanosis or edema. DP/PT/Radials 2+ and equal bilaterally. Neuro: Alert and oriented X 3. Moves all extremities spontaneously. Psych: Normal affect.  LABS:  Recent Labs     01/23/13  0525  01/24/13  0620  WBC  11.1*  15.9*  HGB  8.3*  9.0*  HCT  25.1*  27.0*  MCV  84.5  84.9  PLT  234  300   No results found for this basename: VITAMINB12, FOLATE, FERRITIN, TIBC, IRON, RETICCTPCT,  in the last 72 hours  Recent Labs Lab 01/22/13 1043 01/23/13 0525 01/24/13 0620  NA 131* 133* 138  K 5.5* 5.0 4.6  CL 94* 98 101    CO2 21 20 24   BUN 75* 83* 77*  CREATININE 2.57* 2.30* 2.07*  CALCIUM 7.1* 7.2* 7.8*  GLUCOSE 343* 216* 57*   No results found for this basename: HGBA1C,  in the last 72 hours No results found for this basename: CKTOTAL, CKMB, CKMBINDEX, TROPONINI,  in the last 72 hours No results found for this basename: TSH, T4TOTAL, FREET3, T3FREE, THYROIDAB,  in the last 72 hours  TELE: NSR, 60-70 bpm  Radiology/Studies:  Ct Abdomen Pelvis Wo Contrast  12/31/2012   CLINICAL DATA:  History of para appendiceal abscess with placement of percutaneous drain.  EXAM: CT ABDOMEN AND PELVIS WITHOUT CONTRAST  TECHNIQUE: Multidetector CT imaging of the abdomen and pelvis was performed following the standard protocol without intravenous contrast.  COMPARISON:  CT abdomen and pelvis 08/01/2012.  FINDINGS: Heart size is upper normal. No pleural or pericardial effusion. There is some scarring in the right lung base.  Drainage catheter in the left lower quadrant seen on the prior study has been removed. There is no fluid collection in the abdomen or pelvis. A large volume of stool is seen throughout the colon. The colon is otherwise unremarkable. The appendix has been removed. Small hiatal hernia is noted. The  stomach is otherwise unremarkable. Small bowel appears normal.  The liver, gallbladder, spleen, adrenal glands, pancreas and kidneys appear normal. Extensive aortoiliac atherosclerosis without aneurysm is noted. There is no lymphadenopathy or fluid. No lytic or sclerotic bony lesion is identified.  IMPRESSION: No acute finding. No evidence of complication related to periappendiceal abscess.  Atherosclerosis.  Small hiatal hernia.   Electronically Signed   By: Inge Rise M.D.   On: 12/31/2012 16:10   Dg Chest Port 1 View  01/19/2013   CLINICAL DATA:  Chest pains, shortness of breath  EXAM: PORTABLE CHEST - 1 VIEW  COMPARISON:  07/19/2012  FINDINGS: New bilateral interstitial opacities diffusely with small  effusions. Heart is enlarged. Finds compatible with CHF. Prior median sternotomy wires noted. No pneumothorax. Degenerative changes of the shoulders and spine.  IMPRESSION: Moderate CHF pattern   Electronically Signed   By: Daryll Brod M.D.   On: 01/19/2013 01:23    Current Medications:  . amLODipine  10 mg Oral Daily  . aspirin EC  81 mg Oral Daily  . cholecalciferol  4,000 Units Oral Daily  . cloNIDine  0.1 mg Oral BID  . dextromethorphan-guaiFENesin  1 tablet Oral BID  . doxycycline  100 mg Oral Q12H  . insulin aspart  0-15 Units Subcutaneous TID WC  . insulin aspart  8 Units Subcutaneous TID WC  . insulin glargine  35 Units Subcutaneous Daily  . ipratropium-albuterol  3 mL Nebulization QID  . pantoprazole  40 mg Oral Daily  . predniSONE  40 mg Oral Q breakfast  . simvastatin  20 mg Oral QHS  . sodium chloride  3 mL Intravenous Q12H    ASSESSMENT AND PLAN:  Todays labs are pending.   1. Acute hypoxic respiratory failure- secondary to #2, #3    2. Acute on chronic diastolic CHF-  euvolemic Keep Is and Os about even  3. Acute COPD exacerbation- cough productive of green sputum last week.  I think that this is her primary pulmonary issue presently. -- Management per primary team.   4. NSTEMI-  LIkely due to "demand" ischemia.  My suspicion for an acute thrombosis is low.    Given renal failure, she is not a candidate for cath at this time. Continue current medical management.   -- Borderline bradycardia precludes BB therapy May consider nuclear stress testing once clinically improved (outpatient).  5. Pleuritic chest pain- in the setting of COPD exacerbation (cough).  -- Supportive management.  6. QT prolongation Stable.  avoid Qt prolonging drugs including antibiotics if able.     No further inpatient CV workup planned Would establish follow-up in our [place] office with Dr Harl Bowie upon discharge Will see as needed while here.   Thompson Grayer,  MD 01/24/2013 9:35 AM

## 2013-01-24 NOTE — Care Management (Signed)
SATURATION QUALIFICATIONS: (This note is used to comply with regulatory documentation for home oxygen)  Patient Saturations on Room Air at Rest = 93%   Patient Saturations on Room Air while Ambulating = 83-88%   Patient Saturations on 2 Liters of oxygen while Ambulating = 94%   Please briefly explain why patient needs home oxygen:

## 2013-01-25 NOTE — Progress Notes (Signed)
Discharged to home with family office visits in place teaching done  

## 2013-02-03 ENCOUNTER — Telehealth: Payer: Self-pay | Admitting: Cardiology

## 2013-02-03 ENCOUNTER — Ambulatory Visit: Payer: Medicare Other

## 2013-02-03 NOTE — Telephone Encounter (Signed)
[facility] called due to patient's HR being low for past couple days / please call [facility] at above number / tgs

## 2013-02-04 ENCOUNTER — Inpatient Hospital Stay: Payer: Medicare Other | Admitting: Physician Assistant

## 2013-02-04 NOTE — Telephone Encounter (Signed)
Please advise 

## 2013-02-04 NOTE — Telephone Encounter (Signed)
Spoke with PT assistant reported HR high 40's,low 50's.Just an FYI,pt had no complaints.they took pulse via wrist,not apically

## 2013-02-05 NOTE — Telephone Encounter (Signed)
I have never seen this patient clinically. Can she see me tomorrow in [place] in the afternoon so we can evaluate this. If she is asymptomatic with a normal blood pressure ok to add her to clinic this week. If she has low blood pressures or symptoms she needs to go to the ER

## 2013-02-05 NOTE — Telephone Encounter (Signed)
I spoke with patient, she has NO issues.She states physical therapy noted heart rate is varied which is normal for her. Patient stated "aww honey it changes all the time" Her BP was 110/60. She completed physical therapy strengthening program with no problems.She has apt with you next week and wants to keep that.

## 2013-02-06 ENCOUNTER — Encounter: Payer: Self-pay | Admitting: Physician Assistant

## 2013-02-06 ENCOUNTER — Ambulatory Visit (INDEPENDENT_AMBULATORY_CARE_PROVIDER_SITE_OTHER): Payer: Medicare HMO | Admitting: Physician Assistant

## 2013-02-06 VITALS — BP 128/80 | HR 52 | Temp 98.1°F | Resp 18 | Wt 140.0 lb

## 2013-02-06 DIAGNOSIS — N179 Acute kidney failure, unspecified: Secondary | ICD-10-CM

## 2013-02-06 DIAGNOSIS — N183 Chronic kidney disease, stage 3 unspecified: Secondary | ICD-10-CM

## 2013-02-06 DIAGNOSIS — I509 Heart failure, unspecified: Secondary | ICD-10-CM

## 2013-02-06 DIAGNOSIS — I5031 Acute diastolic (congestive) heart failure: Secondary | ICD-10-CM

## 2013-02-06 DIAGNOSIS — E875 Hyperkalemia: Secondary | ICD-10-CM

## 2013-02-06 DIAGNOSIS — J441 Chronic obstructive pulmonary disease with (acute) exacerbation: Secondary | ICD-10-CM

## 2013-02-06 DIAGNOSIS — J209 Acute bronchitis, unspecified: Secondary | ICD-10-CM

## 2013-02-06 DIAGNOSIS — I10 Essential (primary) hypertension: Secondary | ICD-10-CM

## 2013-02-06 MED ORDER — CLONIDINE HCL 0.3 MG PO TABS: 0.3000 mg | ORAL_TABLET | Freq: Two times a day (BID) | ORAL | Status: DC

## 2013-02-06 MED ORDER — INSULIN GLARGINE 100 UNIT/ML ~~LOC~~ SOLN: 15.0000 [IU] | Freq: Every morning | SUBCUTANEOUS | Status: DC

## 2013-02-06 NOTE — Progress Notes (Signed)
Patient ID: Laura Best MRN: [id number], DOB: 04/17/1940, 73 y.o. Date of Encounter: 02/06/2013, 11:57 AM    Chief Complaint:  Chief Complaint  Patient presents with  . hosp follow up    has O2 at home but no portable  denies any resp distress     HPI: 73 y.o. year old female here for hospital followup. I have reviewed records in the computer. However there was no discharge summary. However, it looks like the major issues that were addressed were: 1-acute hypoxic respiratory failure secondary to #2 and #3 below 2-acute on chronic diastolic heart failure 3-acute COPD exacerbation 4-NSTEMI--no cath secondary to acute on chronic renal failure--medical management  She was in the hospital 12/28 through 73/2.  She states that she has completed the antibiotics and has completed the prednisone.  I asked if there were  any other new or different medications compared to prior to the hospitalization. She states that lisinopril was stopped and she does have this documented on the paper she has from the hospital discharge. She states that the only new added medication was Furosemide 40 mg one every morning. Also started on aspirin and Mucinex DM.  Says she has oxygen at home to use as needed. Says nurse from [facility] is scheduled to come check on her today. Says that they check her oxygen and she does her exercises etc.  She has followup appointment with cardiology next Friday 02/14/13.  She reports that she feels that her breathing is back to baseline. Is not having any cough and no productive phlegm. No fevers or chills.     Home Meds: See attached medication section for any medications that were entered at today's visit. The computer does not put those onto this list.The following list is a list of meds entered prior to today's visit.   Current Outpatient Prescriptions on File Prior to Visit  Medication Sig Dispense Refill  . acetaminophen (TYLENOL) 500 MG tablet Take 500  mg by mouth every 6 (six) hours as needed for pain.      Marland Kitchen alendronate (FOSAMAX) 70 MG tablet Take 1 tablet (70 mg total) by mouth every 7 (seven) days. Take with a full glass of water on an empty stomach.  4 tablet  11  . amLODipine (NORVASC) 10 MG tablet Take 10 mg by mouth daily.      Marland Kitchen aspirin EC 81 MG EC tablet Take 1 tablet (81 mg total) by mouth daily.      . cholecalciferol (VITAMIN D) 1000 UNITS tablet Take 4,000 Units by mouth daily.       Marland Kitchen dextromethorphan-guaiFENesin (MUCINEX DM) 30-600 MG per 12 hr tablet Take 1 tablet by mouth 2 (two) times daily as needed for cough.      . furosemide (LASIX) 40 MG tablet Take 1 tablet (40 mg total) by mouth daily.  30 tablet  0 a  . simvastatin (ZOCOR) 20 MG tablet Take 1 tablet (20 mg total) by mouth at bedtime.  90 tablet  0  . albuterol-ipratropium (COMBIVENT) 18-103 MCG/ACT inhaler Inhale 2 puffs into the lungs 4 (four) times daily.  1 Inhaler  0   No current facility-administered medications on file prior to visit.    Allergies:  Allergies  Allergen Reactions  . Aspirin Other (See Comments)    G.I. Upset.   . Metformin And Related Diarrhea      Review of Systems: See HPI for pertinent ROS. All other ROS negative.  Physical Exam: Blood pressure 128/80, pulse 52, temperature 98.1 F (36.7 C), temperature source Oral, resp. rate 18, weight 140 lb (63.504 kg), SpO2 98.00%., Body mass index is 24.81 kg/(m^2). General:  WNWD WF. Appears in no acute distress. Neck: Supple. No thyromegaly. No lymphadenopathy. Lungs: Clear bilaterally to auscultation without wheezes, rales, or rhonchi. Breathing is unlabored. Heart: Regular rhythm. No murmurs, rubs, or gallops. Msk:  Strength and tone normal for age. Extremities/Skin: NO LE edema. Neuro: Alert and oriented X 3. Moves all extremities spontaneously. Gait is normal. CNII-XII grossly in tact. Psych:  Responds to questions appropriately with a normal affect.     ASSESSMENT AND PLAN:    73 y.o. year old female with  1. S/P Acute bronchitis  2. S/P Acute diastolic heart failure  3. S/P Acute renal failure   4. S/P CHF exacerbation  5. S/P COPD exacerbation  6. S/P Hyperkalemia  She is currently stable. No medication changes today. Keep her followup appointment with cardiology on 02/14/13. Signed, [address] [place], [place], [facility] 02/06/2013 11:57 AM

## 2013-02-06 NOTE — Telephone Encounter (Signed)
Thank you, I will see her then in clinic.   Carlyle Dolly MD

## 2013-02-11 ENCOUNTER — Encounter: Payer: Medicare Other | Admitting: Cardiology

## 2013-02-12 ENCOUNTER — Encounter: Payer: Self-pay | Admitting: Family Medicine

## 2013-02-14 ENCOUNTER — Encounter: Payer: Self-pay | Admitting: Cardiology

## 2013-02-14 ENCOUNTER — Ambulatory Visit (INDEPENDENT_AMBULATORY_CARE_PROVIDER_SITE_OTHER): Payer: Medicare HMO | Admitting: Cardiology

## 2013-02-14 VITALS — BP 165/48 | HR 61 | Ht 63.0 in | Wt 142.0 lb

## 2013-02-14 DIAGNOSIS — I498 Other specified cardiac arrhythmias: Secondary | ICD-10-CM

## 2013-02-14 DIAGNOSIS — I509 Heart failure, unspecified: Secondary | ICD-10-CM

## 2013-02-14 DIAGNOSIS — I10 Essential (primary) hypertension: Secondary | ICD-10-CM

## 2013-02-14 DIAGNOSIS — R001 Bradycardia, unspecified: Secondary | ICD-10-CM

## 2013-02-14 DIAGNOSIS — I5032 Chronic diastolic (congestive) heart failure: Secondary | ICD-10-CM

## 2013-02-14 MED ORDER — CLONIDINE HCL 0.2 MG PO TABS: 0.2000 mg | ORAL_TABLET | Freq: Two times a day (BID) | ORAL | Status: DC

## 2013-02-14 NOTE — Patient Instructions (Signed)
Your physician recommends that you schedule a follow-up appointment in: [place] has recommended you make the following change in your medication:   1) DECREASE YOUR CLONIDINE TO 0.2MG  TWICE DAILY

## 2013-02-14 NOTE — Progress Notes (Signed)
Clinical Summary Laura Best is a 73 y.o.female last seen by NP Purcell Nails, this is our first visit together   1. Diastolic heart failure - deneis any SOB or DOE, no orthopnea, PND or lower extremity edema - compliant with diuretics   2. HTN - checks bp at home, 120-140/80s - compliant with meds - Cr 2.07 BUN 77 GFR 23, from notes there is a question of possible right renal artery stenosis.    3. Bradycardia - noted on home vitals checks  - pulse rate in 40s at home per her log, denies any significant lightheadedness, dizziness, or syncope. Blood pressures have been stable.     Past Medical History  Diagnosis Date  . Diabetes mellitus   . Hypertension   . Hyperlipidemia   . Tricuspid regurgitation     Echo 05/02/06-Nml LV. Mild MR. Mod TR  . Osteoporosis   . Diastolic heart failure   . Vitamin D deficiency   . Osteoporosis   . CKD (chronic kidney disease) stage 3, GFR 30-59 ml/min 12/25/2012     Allergies  Allergen Reactions  . Ace Inhibitors Other (See Comments)    Hyperkalemia  . Aspirin Other (See Comments)    G.I. Upset.   . Metformin And Related Diarrhea     Current Outpatient Prescriptions  Medication Sig Dispense Refill  . acetaminophen (TYLENOL) 500 MG tablet Take 500 mg by mouth every 6 (six) hours as needed for pain.      Marland Kitchen albuterol-ipratropium (COMBIVENT) 18-103 MCG/ACT inhaler Inhale 2 puffs into the lungs 4 (four) times daily.  1 Inhaler  0  . alendronate (FOSAMAX) 70 MG tablet Take 1 tablet (70 mg total) by mouth every 7 (seven) days. Take with a full glass of water on an empty stomach.  4 tablet  11  . amLODipine (NORVASC) 10 MG tablet Take 10 mg by mouth daily.      Marland Kitchen aspirin EC 81 MG EC tablet Take 1 tablet (81 mg total) by mouth daily.      . cholecalciferol (VITAMIN D) 1000 UNITS tablet Take 4,000 Units by mouth daily.       . cloNIDine (CATAPRES) 0.3 MG tablet Take 1 tablet (0.3 mg total) by mouth 2 (two) times daily.  60 tablet  5  .  dextromethorphan-guaiFENesin (MUCINEX DM) 30-600 MG per 12 hr tablet Take 1 tablet by mouth 2 (two) times daily as needed for cough.      . furosemide (LASIX) 40 MG tablet Take 1 tablet (40 mg total) by mouth daily.  30 tablet  0 a  . insulin glargine (LANTUS) 100 UNIT/ML injection Inject 0.15 mLs (15 Units total) into the skin every morning.  10 mL  11  . simvastatin (ZOCOR) 20 MG tablet Take 1 tablet (20 mg total) by mouth at bedtime.  90 tablet  0   No current facility-administered medications for this visit.     Past Surgical History  Procedure Laterality Date  . Heart chamber revision      patched hole --1993  . Tubal ligation       Allergies  Allergen Reactions  . Ace Inhibitors Other (See Comments)    Hyperkalemia  . Aspirin Other (See Comments)    G.I. Upset.   . Metformin And Related Diarrhea      Family History  Problem Relation Age of Onset  . Cancer Father   . Diabetes Father   . Cancer Brother     Brain  .  Cancer Sister     abdominal fat     Social History Laura Best reports that she quit smoking about 4 years ago. She does not have any smokeless tobacco history on file. Laura Best reports that she does not drink alcohol.   Review of Systems CONSTITUTIONAL: No weight loss, fever, chills, weakness or fatigue.  HEENT: Eyes: No visual loss, blurred vision, double vision or yellow sclerae.No hearing loss, sneezing, congestion, runny nose or sore throat.  SKIN: No rash or itching.  CARDIOVASCULAR: per HPI RESPIRATORY: No shortness of breath, cough or sputum.  GASTROINTESTINAL: No anorexia, nausea, vomiting or diarrhea. No abdominal pain or blood.  GENITOURINARY: No burning on urination, no polyuria NEUROLOGICAL: No headache, dizziness, syncope, paralysis, ataxia, numbness or tingling in the extremities. No change in bowel or bladder control.  MUSCULOSKELETAL: No muscle, back pain, joint pain or stiffness.  LYMPHATICS: No enlarged nodes. No history of  splenectomy.  PSYCHIATRIC: No history of depression or anxiety.  ENDOCRINOLOGIC: No reports of sweating, cold or heat intolerance. No polyuria or polydipsia.  Marland Kitchen   Physical Examination There were no vitals filed for this visit. Filed Weights   02/14/13 0854  Weight: 142 lb (64.411 kg)    Gen: resting comfortably, no acute distress HEENT: no scleral icterus, pupils equal round and reactive, no palptable cervical adenopathy,  CV: RRR, no m/r/g, no JVD, no carotid bruits Resp: Clear to auscultation bilaterally GI: abdomen is soft, non-tender, non-distended, normal bowel sounds, no hepatosplenomegaly MSK: extremities are warm, no edema.  Skin: warm, no rash Neuro:  no focal deficits Psych: appropriate affect   Diagnostic Studies 12/2012 Echo LVEF 55%, mild LVH, grade II diastolic dysfunction,   123456 EKG: sinus bradycardia rate 59 Assessment and Plan  1. Chronic diastolic heart failure - no current symptoms, appears euvolemic in clinic today - continue bp control and current diuretic dosing  2. HTN - well controlled based on home bp log - in setting of low heart rates will begin weaning clonidine, decrease to 0.2 mg bid and follow  3. Bradycardia - asymptomatic, likely medication related. Will wean clonidine.     Follow up 1 month  Arnoldo Lenis, M.D., F.A.C.C.

## 2013-02-19 ENCOUNTER — Telehealth: Payer: Self-pay | Admitting: Family Medicine

## 2013-02-19 DIAGNOSIS — E119 Type 2 diabetes mellitus without complications: Secondary | ICD-10-CM

## 2013-02-19 MED ORDER — FUROSEMIDE 40 MG PO TABS: 40.0000 mg | ORAL_TABLET | Freq: Every day | ORAL | Status: DC

## 2013-02-19 MED ORDER — "PEN NEEDLES 5/16"" 31G X 8 MM MISC": 1.0000 | Freq: Every day | Status: DC

## 2013-02-19 MED ORDER — INSULIN DETEMIR 100 UNIT/ML FLEXPEN: 15.0000 [IU] | PEN_INJECTOR | SUBCUTANEOUS | Status: DC

## 2013-02-19 NOTE — Telephone Encounter (Signed)
Received letter for [employer] dated 02/10/13.  It informs patient that Lantus insulin is not a preferred drug on their formulary.  Per provider, change is being made to Levemir at the same 15 units QAM.  I spoke to patient and she is aware of change

## 2013-02-25 ENCOUNTER — Ambulatory Visit
Admission: RE | Admit: 2013-02-25 | Discharge: 2013-02-25 | Disposition: A | Discharge status: Home / Self Care | Payer: Self-pay | Source: Ambulatory Visit | Attending: Physician Assistant | Admitting: Physician Assistant

## 2013-02-25 DIAGNOSIS — Z1231 Encounter for screening mammogram for malignant neoplasm of breast: Secondary | ICD-10-CM

## 2013-03-17 ENCOUNTER — Ambulatory Visit (INDEPENDENT_AMBULATORY_CARE_PROVIDER_SITE_OTHER): Payer: Medicare HMO | Admitting: Adult Health

## 2013-03-17 ENCOUNTER — Encounter: Payer: Self-pay | Admitting: Adult Health

## 2013-03-17 VITALS — BP 156/46 | HR 75 | Ht 63.0 in | Wt 151.8 lb

## 2013-03-17 DIAGNOSIS — I10 Essential (primary) hypertension: Secondary | ICD-10-CM

## 2013-03-17 DIAGNOSIS — R0602 Shortness of breath: Secondary | ICD-10-CM

## 2013-03-17 DIAGNOSIS — I509 Heart failure, unspecified: Secondary | ICD-10-CM

## 2013-03-17 NOTE — Progress Notes (Signed)
HPI: Laura Best is a 73 year old patient of Dr. branch we are following for ongoing assessment and management of chronic diastolic heart failure, hypertension, and bradycardia. She was last seen by Dr. Harl Bowie on 02/14/2013.  Most recent echocardiogram was completed on 12 2014 with an LVEF of 55%, mild LVH, grade 2 diastolic dysfunction. At the time of that office visit the patient was doing well, with no recurrent symptoms, and appears euvolemic. Her blood pressure was well controlled on blood pressure log from home. In the setting of low heart rates, weaning clonidine was implemented. It was decreased to 0.2 mg twice a day. She is here for followup.   She comes today with recordings of her blood pressure at home. There running between [id number] and [id number] systolic. Heart rate has been in the 40s and 50s. She is asymptomatic. She wishes to stop wearing her oxygen. She states that her oxygen level has been running okay at home although she does not have a monitor. She has not been short of breath. She has not had any dyspnea on exertion, or PND. She has been medically compliant.       Allergies  Allergen Reactions  . Ace Inhibitors Other (See Comments)    Hyperkalemia  . Aspirin Other (See Comments)    G.I. Upset.   . Metformin And Related Diarrhea    Current Outpatient Prescriptions  Medication Sig Dispense Refill  . ACCU-CHEK AVIVA PLUS test strip       . acetaminophen (TYLENOL) 500 MG tablet Take 500 mg by mouth every 6 (six) hours as needed for pain.      Marland Kitchen albuterol-ipratropium (COMBIVENT) 18-103 MCG/ACT inhaler Inhale 2 puffs into the lungs 4 (four) times daily.  1 Inhaler  0  . alendronate (FOSAMAX) 70 MG tablet Take 1 tablet (70 mg total) by mouth every 7 (seven) days. Take with a full glass of water on an empty stomach.  4 tablet  11  . amLODipine (NORVASC) 10 MG tablet Take 10 mg by mouth daily.      Marland Kitchen aspirin EC 81 MG EC tablet Take 1 tablet (81 mg total) by mouth daily.      .  cholecalciferol (VITAMIN D) 1000 UNITS tablet Take 4,000 Units by mouth daily.       . cloNIDine (CATAPRES) 0.2 MG tablet Take 1 tablet (0.2 mg total) by mouth 2 (two) times daily.  60 tablet  6  . dextromethorphan-guaiFENesin (MUCINEX DM) 30-600 MG per 12 hr tablet Take 1 tablet by mouth 2 (two) times daily as needed for cough.      . furosemide (LASIX) 40 MG tablet Take 1 tablet (40 mg total) by mouth daily.  30 tablet  3  . Insulin Detemir (LEVEMIR) 100 UNIT/ML Pen Inject 15 Units into the skin every morning.  15 mL  11  . Insulin Pen Needle (PEN NEEDLES 31GX5/16") 31G X 8 MM MISC 1 each by Does not apply route daily.  30 each  11  . LANTUS 100 UNIT/ML injection       . simvastatin (ZOCOR) 20 MG tablet Take 1 tablet (20 mg total) by mouth at bedtime.  90 tablet  0   No current facility-administered medications for this visit.    Past Medical History  Diagnosis Date  . Diabetes mellitus   . Hypertension   . Hyperlipidemia   . Tricuspid regurgitation     Echo 05/02/06-Nml LV. Mild MR. Mod TR  . Osteoporosis   . Diastolic  heart failure   . Vitamin D deficiency   . Osteoporosis   . CKD (chronic kidney disease) stage 3, GFR 30-59 ml/min 12/25/2012    Past Surgical History  Procedure Laterality Date  . Heart chamber revision      patched hole --1993  . Tubal ligation      [id number] of systems complete and found to be negative unless listed above  PHYSICAL EXAM BP 156/46  Pulse 75  Ht 5\' 3"  (1.6 m)  Wt 151 lb 12 oz (68.833 kg)  BMI 26.89 kg/m2  General: Well developed, well nourished, in no acute distress Head: Eyes PERRLA, No xanthomas.   Normal cephalic and atramatic  Lungs: Clear bilaterally to auscultation and percussion. Heart: HRRR S1 S2, with soft systolic murmur. Pulses are 2+ & equal.            No carotid bruit. No JVD.  No abdominal bruits. No femoral bruits. Abdomen: Bowel sounds are positive, abdomen soft and non-tender without masses or                   Hernia's noted. Msk:  Back normal, normal gait. Normal strength and tone for age. Extremities: No clubbing, cyanosis or edema.  DP +1 Neuro: Alert and oriented X 3. Psych:  Good affect, responds appropriately    ASSESSMENT AND PLAN

## 2013-03-17 NOTE — Progress Notes (Deleted)
Name: Laura Best    DOB: 1940-11-28  Age: 73 y.o.  MR#: [id number]       PCP:  [name], PA-C      Insurance: Payor: [employer] MEDICARE / Plan: [employer] MED[employer]ARE HMO/PPO / Product Type: *No Product type* /   CC:    Chief Complaint  Patient presents with  . Congestive Heart Failure  . Hypertension    VS Filed Vitals:   03/17/13 1322  BP: 156/46  Pulse: 75  Height: 5\' 3"  (1.6 m)  Weight: 151 lb 12 oz (68.833 kg)    Weights Current Weight  03/17/13 151 lb 12 oz (68.833 kg)  02/14/13 142 lb (64.411 kg)  02/06/13 140 lb (63.504 kg)    Blood Pressure  BP Readings from Last 3 Encounters:  03/17/13 156/46  02/14/13 165/48  02/06/13 128/80     Admit date:  (Not on file) Last encounter with RMR:  01/09/2013   Allergy Ace inhibitors; Aspirin; and Metformin and related  Current Outpatient Prescriptions  Medication Sig Dispense Refill  . ACCU-CHEK AVIVA PLUS test strip       . acetaminophen (TYLENOL) 500 MG tablet Take 500 mg by mouth every 6 (six) hours as needed for pain.      Marland Kitchen albuterol-ipratropium (COMBIVENT) 18-103 MCG/ACT inhaler Inhale 2 puffs into the lungs 4 (four) times daily.  1 Inhaler  0  . alendronate (FOSAMAX) 70 MG tablet Take 1 tablet (70 mg total) by mouth every 7 (seven) days. Take with a full glass of water on an empty stomach.  4 tablet  11  . amLODipine (NORVASC) 10 MG tablet Take 10 mg by mouth daily.      Marland Kitchen aspirin EC 81 MG EC tablet Take 1 tablet (81 mg total) by mouth daily.      . cholecalciferol (VITAMIN D) 1000 UNITS tablet Take 4,000 Units by mouth daily.       . cloNIDine (CATAPRES) 0.2 MG tablet Take 1 tablet (0.2 mg total) by mouth 2 (two) times daily.  60 tablet  6  . dextromethorphan-guaiFENesin (MUCINEX DM) 30-600 MG per 12 hr tablet Take 1 tablet by mouth 2 (two) times daily as needed for cough.      . furosemide (LASIX) 40 MG tablet Take 1 tablet (40 mg total) by mouth daily.  30 tablet  3  . Insulin Detemir (LEVEMIR) 100 UNIT/ML Pen Inject  15 Units into the skin every morning.  15 mL  11  . Insulin Pen Needle (PEN NEEDLES 31GX5/16") 31G X 8 MM MISC 1 each by Does not apply route daily.  30 each  11  . LANTUS 100 UNIT/ML injection       . simvastatin (ZOCOR) 20 MG tablet Take 1 tablet (20 mg total) by mouth at bedtime.  90 tablet  0   No current facility-administered medications for this visit.    Discontinued Meds:   There are no discontinued medications.  Patient Active Problem List   Diagnosis Date Noted  . Hyperkalemia 01/22/2013  . Acute renal failure 01/21/2013  . Anemia of chronic disease 01/21/2013  . Acute bronchitis 01/20/2013  . CHF exacerbation 01/19/2013  . Congestive heart disease 01/19/2013  . Acute diastolic CHF (congestive heart failure) 01/19/2013  . CKD (chronic kidney disease) stage 3, GFR 30-59 ml/min 12/25/2012  . Screening for colorectal cancer 12/25/2012  . Breast cancer screening 12/25/2012  . Vitamin D deficiency   . Osteoporosis   . Postop check 08/13/2012  . Appendiceal  abscess 07/30/2012  . Fever, unspecified 07/19/2012  . Abdominal  pain, other specified site 07/19/2012  . SOB (shortness of breath) 07/19/2012  . ARF (acute renal failure) 07/19/2012  . Abscess, appendix 07/19/2012  . Acute diastolic heart failure [id number]  . Diabetes type 2, uncontrolled 02/02/2011  . Pulmonary hypertension 02/02/2011  . COPD exacerbation 02/02/2011  . Hypokalemia 02/02/2011  . Normocytic anemia 02/02/2011  . Hypertension   . Hyperlipidemia     LABS    Component Value Date/Time   NA 138 01/24/2013 0620   NA 133* 01/23/2013 0525   NA 131* 01/22/2013 1043   K 4.6 01/24/2013 0620   K 5.0 01/23/2013 0525   K 5.5* 01/22/2013 1043   CL 101 01/24/2013 0620   CL 98 01/23/2013 0525   CL 94* 01/22/2013 1043   CO2 24 01/24/2013 0620   CO2 20 01/23/2013 0525   CO2 21 01/22/2013 1043   GLUCOSE 57* 01/24/2013 0620   GLUCOSE 216* 01/23/2013 0525   GLUCOSE 343* 01/22/2013 1043   GLUCOSE 163* 07/19/2012 1005    GLUCOSE 181* 06/13/2012 1129   BUN 77* 01/24/2013 0620   BUN 83* 01/23/2013 0525   BUN 75* 01/22/2013 1043   CREATININE 2.07* 01/24/2013 0620   CREATININE 2.30* 01/23/2013 0525   CREATININE 2.57* 01/22/2013 1043   CREATININE 1.54* 12/16/2012 1122   CREATININE 1.21* 04/01/2012 0000   CREATININE 1.51* 03/06/2011 1130   CALCIUM 7.8* 01/24/2013 0620   CALCIUM 7.2* 01/23/2013 0525   CALCIUM 7.1* 01/22/2013 1043   GFRNONAA 23* 01/24/2013 0620   GFRNONAA 20* 01/23/2013 0525   GFRNONAA 18* 01/22/2013 1043   GFRAA 26* 01/24/2013 0620   GFRAA 23* 01/23/2013 0525   GFRAA 20* 01/22/2013 1043   CMP     Component Value Date/Time   NA 138 01/24/2013 0620   K 4.6 01/24/2013 0620   CL 101 01/24/2013 0620   CO2 24 01/24/2013 0620   GLUCOSE 57* 01/24/2013 0620   GLUCOSE 163* 07/19/2012 1005   BUN 77* 01/24/2013 0620   CREATININE 2.07* 01/24/2013 0620   CREATININE 1.54* 12/16/2012 1122   CALCIUM 7.8* 01/24/2013 0620   PROT 7.8 12/16/2012 1122   ALBUMIN 4.2 12/16/2012 1122   AST 13 12/16/2012 1122   ALT 12 12/16/2012 1122   ALKPHOS 71 12/16/2012 1122   BILITOT 0.3 12/16/2012 1122   GFRNONAA 23* 01/24/2013 0620   GFRAA 26* 01/24/2013 0620       Component Value Date/Time   WBC 15.9* 01/24/2013 0620   WBC 11.1* 01/23/2013 0525   WBC 9.1 01/22/2013 0540   HGB 9.0* 01/24/2013 0620   HGB 8.3* 01/23/2013 0525   HGB 7.9* 01/22/2013 0540   HCT 27.0* 01/24/2013 0620   HCT 25.1* 01/23/2013 0525   HCT 24.1* 01/22/2013 0540   MCV 84.9 01/24/2013 0620   MCV 84.5 01/23/2013 0525   MCV 85.5 01/22/2013 0540    Lipid Panel     Component Value Date/Time   CHOL 205* 12/16/2012 1122   TRIG 154* 12/16/2012 1122   HDL 57 12/16/2012 1122   CHOLHDL 3.6 12/16/2012 1122   VLDL 31 12/16/2012 1122   LDLCALC 117* 12/16/2012 1122    ABG    Component Value Date/Time   TCO2 22 12/24/2008 2235     Lab Results  Component Value Date   TSH 1.316 01/19/2013   BNP (last 3 results)  Recent Labs  01/19/13 0157  PROBNP 10409.0*   Cardiac Panel (last 3  results) No results  found for this basename: CKTOTAL, CKMB, TROPONINI, RELINDX,  in the last 72 hours  Iron/TIBC/Ferritin    Component Value Date/Time   IRON 25* 01/19/2013 0830   TIBC 233* 01/19/2013 0830   FERRITIN 301* 01/19/2013 0830     EKG Orders placed in visit on 02/14/13  . EKG 12-LEAD     Prior Assessment and Plan Problem List as of 03/17/2013   Hypertension   Last Assessment & Plan   01/09/2013 Office Visit Written 01/09/2013  2:05 PM by Lendon Colonel, NP     Excellent control of BP on this visit. She will continue current regimen. She is followed in [facility] for labs, due next week. See her again in 6 months to be established with cardiologist as RR has retired.    Hyperlipidemia   Last Assessment & Plan   03/13/2012 Office Visit Written 03/13/2012  2:14 PM by Lendon Colonel, NP     She is not on a statin at this time. She refuses statin therapy at this time, but is willing to try this again if the other medications do not cause stomach upset. Will restart medications a little at a time. She needs coaxing to be compliant.    Diabetes type 2, uncontrolled   Last Assessment & Plan   03/13/2012 Office Visit Written 03/13/2012  2:16 PM by Lendon Colonel, NP     She is advised to see PCP for reinstitution of insulin. She verbalizes understanding.    Pulmonary hypertension   COPD exacerbation   Last Assessment & Plan   02/20/2011 Office Visit Written 02/20/2011 12:27 PM by Lendon Colonel, NP     Breathing status is stable. No excessive DOE.    Hypokalemia   Normocytic anemia   Acute diastolic heart failure   Last Assessment & Plan   01/09/2013 Office Visit Written 01/09/2013  2:07 PM by Lendon Colonel, NP     No evidence of fluid retention or decompensation. Continue current treatment plan. Follow up with new cardiologist on next visit.    Fever, unspecified   Last Assessment & Plan   07/19/2012 Office Visit Written 07/19/2012  2:47 PM by Alycia Rossetti, MD     Patient has some type of infection brewing for white count in the office was 19,000 this is up from 16,003 days ago. Differentials include pneumonia versus diverticulitis or other abdominal infection. Send to emergency room for repeat chest x-ray CT scan IV fluids.    Abdominal  pain, other specified site   Last Assessment & Plan   07/19/2012 Office Visit Written 07/19/2012  2:47 PM by Alycia Rossetti, MD     Per above, send to ER for further work-up , CT scan    SOB (shortness of breath)   Last Assessment & Plan   01/09/2013 Office Visit Written 01/09/2013  2:07 PM by Lendon Colonel, NP     Resolved with BP control. She continues COPD symptoms but are better controlled now. Continue with THN.    ARF (acute renal failure)   Last Assessment & Plan   07/19/2012 Office Visit Written 07/19/2012  2:47 PM by Alycia Rossetti, MD     Acute renal failure noted on labs 3 days ago this will need to be repeated to see if this has worsened    Abscess, appendix   Appendiceal abscess   Postop check   Vitamin D deficiency   Osteoporosis   CKD (chronic kidney disease) stage  3, GFR 30-59 ml/min   Screening for colorectal cancer   Breast cancer screening   CHF exacerbation   Congestive heart disease   Acute diastolic CHF (congestive heart failure)   Acute bronchitis   Acute renal failure   Anemia of chronic disease   Hyperkalemia       Imaging: Mm Digital Screening  03/05/2013   CLINICAL DATA:  Screening.  EXAM: DIGITAL SCREENING BILATERAL MAMMOGRAM WITH CAD  COMPARISON:  Previous exam(s).  ACR Breast Density Category c: The breast tissue is heterogeneously dense, which may obscure small masses.  FINDINGS: There are no findings suspicious for malignancy. Images were processed with CAD.  IMPRESSION: No mammographic evidence of malignancy. A result letter of this screening mammogram will be mailed directly to the patient.  RECOMMENDATION: Screening mammogram in one year.  (Code:SM-B-01Y)  BI-RADS CATEGORY  1: Negative.   Electronically Signed   By: Shon Hale M.D.   On: 03/05/2013 16:06

## 2013-03-17 NOTE — Assessment & Plan Note (Signed)
No evidence of heart failure, fluid retention, or symptoms thereof. She has gained some weight, but I do not find this to be fluid related. She states she has been eating more with her new diabetic medication causing her to feel hungry. She is also eating healthier. Have asked her to please continue to weigh  daily on a digital scale and record as she does her blood pressure.

## 2013-03-17 NOTE — Patient Instructions (Signed)
Your physician recommends that you schedule a follow-up appointment in: 6 months with Dr Bryna Colander will receive a reminder letter two months in advance reminding you to call and schedule your appointment. If you don't receive this letter, please contact our office.  Your physician recommends that you continue on your current medications as directed. Please refer to the Current Medication list given to you today.  Discontinue home oxygen.

## 2013-03-17 NOTE — Assessment & Plan Note (Signed)
She wishes to stop her oxygen at home. She states that her breathing status is fine she does like it short of breath and would like them to come and pick it up. I checked her oxygen saturation here in the office and it was 98%. If she is not using it or wearing it, I have advised her to call them and have a oxygen provider to pick it up.

## 2013-03-17 NOTE — Assessment & Plan Note (Signed)
Blood pressure at home is running within normal limits and parameters. I will continue her on the clonidine 0.2 mg twice a day for now. Heart rate is in the high 40s and 50s. Her pulse is in the office is 75. She denies any dizziness or presyncope. She is put on a lot of weight which is not related to fluid. She is to needing better. She has been started on new insulin and she states is making her hungry. She is avoiding salt. She will followup with Dr. Harl Bowie in 4 months. Of course she can come sooner if she is having symptoms.

## 2013-03-25 ENCOUNTER — Telehealth: Payer: Self-pay | Admitting: *Deleted

## 2013-03-25 NOTE — Telephone Encounter (Signed)
Pt called concerned about her BS they are not coming downn has been elevated since she was put on the pen her BS this am was 152 but been ranging around 220,s  Wants to know what to do.Please advise!

## 2013-03-25 NOTE — Telephone Encounter (Signed)
appt given for Thursday when transportation can bring her

## 2013-03-27 ENCOUNTER — Ambulatory Visit: Payer: Self-pay | Admitting: Physician Assistant

## 2013-04-01 ENCOUNTER — Other Ambulatory Visit: Payer: Self-pay | Admitting: Physician Assistant

## 2013-04-01 DIAGNOSIS — E559 Vitamin D deficiency, unspecified: Secondary | ICD-10-CM

## 2013-04-01 DIAGNOSIS — M810 Age-related osteoporosis without current pathological fracture: Secondary | ICD-10-CM

## 2013-04-02 ENCOUNTER — Encounter: Payer: Self-pay | Admitting: Family Medicine

## 2013-04-02 ENCOUNTER — Ambulatory Visit (INDEPENDENT_AMBULATORY_CARE_PROVIDER_SITE_OTHER): Payer: Medicare HMO | Admitting: Physician Assistant

## 2013-04-02 ENCOUNTER — Encounter: Payer: Self-pay | Admitting: Physician Assistant

## 2013-04-02 VITALS — BP 132/70 | HR 38 | Temp 97.0°F | Resp 18 | Ht 64.0 in | Wt 150.0 lb

## 2013-04-02 DIAGNOSIS — IMO0001 Reserved for inherently not codable concepts without codable children: Secondary | ICD-10-CM

## 2013-04-02 DIAGNOSIS — IMO0002 Reserved for concepts with insufficient information to code with codable children: Secondary | ICD-10-CM

## 2013-04-02 DIAGNOSIS — I498 Other specified cardiac arrhythmias: Secondary | ICD-10-CM

## 2013-04-02 DIAGNOSIS — E1165 Type 2 diabetes mellitus with hyperglycemia: Secondary | ICD-10-CM

## 2013-04-02 DIAGNOSIS — R001 Bradycardia, unspecified: Secondary | ICD-10-CM

## 2013-04-02 LAB — HEMOGLOBIN A1C, FINGERSTICK: Hgb A1C (fingerstick): 7.9 % — ABNORMAL HIGH (ref <5.7)

## 2013-04-02 MED ORDER — PIOGLITAZONE HCL 30 MG PO TABS: 30.0000 mg | ORAL_TABLET | Freq: Every day | ORAL | Status: DC

## 2013-04-02 NOTE — Progress Notes (Signed)
Patient ID: Laura Best MRN: [id number], DOB: 03/21/1940, 73 y.o. Date of Encounter: 04/02/2013, 10:57 AM    Chief Complaint:  Chief Complaint  Patient presents with  . prob with insulin    BS running high, currently on 15 units daily     HPI: 73 y.o. year old white female in for visit today because her blood sugars have been reading high recently. She brought in papers where she has been keeping a log of this. She checks blood sugar fasting in the morning. This is the only time of day that she checks it. Following readings are since February 27:  213, 210, 231, 221, 152.  And so on. Readings are met in his range. Lowest reading was 138. Highest is 231.     Home Meds: See attached medication section for any medications that were entered at today's visit. The computer does not put those onto this list.The following list is a list of meds entered prior to today's visit.   Current Outpatient Prescriptions on File Prior to Visit  Medication Sig Dispense Refill  . ACCU-CHEK AVIVA PLUS test strip       . acetaminophen (TYLENOL) 500 MG tablet Take 500 mg by mouth every 6 (six) hours as needed for pain.      Marland Kitchen alendronate (FOSAMAX) 70 MG tablet Take 1 tablet (70 mg total) by mouth every 7 (seven) days. Take with a full glass of water on an empty stomach.  4 tablet  11  . amLODipine (NORVASC) 10 MG tablet Take 10 mg by mouth daily.      Marland Kitchen aspirin EC 81 MG EC tablet Take 1 tablet (81 mg total) by mouth daily.      . cholecalciferol (VITAMIN D) 1000 UNITS tablet Take 4,000 Units by mouth daily.       . cloNIDine (CATAPRES) 0.2 MG tablet Take 1 tablet (0.2 mg total) by mouth 2 (two) times daily.  60 tablet  6  . dextromethorphan-guaiFENesin (MUCINEX DM) 30-600 MG per 12 hr tablet Take 1 tablet by mouth 2 (two) times daily as needed for cough.      . furosemide (LASIX) 40 MG tablet Take 1 tablet (40 mg total) by mouth daily.  30 tablet  3  . Insulin Detemir (LEVEMIR) 100 UNIT/ML Pen Inject  15 Units into the skin every morning.  15 mL  11  . Insulin Pen Needle (PEN NEEDLES 31GX5/16") 31G X 8 MM MISC 1 each by Does not apply route daily.  30 each  11  . simvastatin (ZOCOR) 20 MG tablet Take 1 tablet (20 mg total) by mouth at bedtime.  90 tablet  0   No current facility-administered medications on file prior to visit.    Allergies:  Allergies  Allergen Reactions  . Ace Inhibitors Other (See Comments)    Hyperkalemia  . Aspirin Other (See Comments)    G.I. Upset.   . Metformin And Related Diarrhea      Review of Systems: See HPI for pertinent ROS. All other ROS negative.    Physical Exam: Blood pressure 132/70, pulse 38, temperature 97 F (36.1 C), temperature source Oral, resp. rate 18, height $RemoveBe'5\' 4"'VUpEfaDTL$  (1.626 m), weight 150 lb (68.04 kg), SpO2 98.00%., Body mass index is 25.73 kg/(m^2). General: WNWD WF.  Appears in no acute distress. Neck: Supple. No thyromegaly. No lymphadenopathy. Lungs: Clear bilaterally to auscultation without wheezes, rales, or rhonchi. Breathing is unlabored. Heart: Regular rhythm. No murmurs, rubs, or gallops. Msk:  Strength and tone normal for age. Extremities/Skin: Warm and dry.. No edema.  Neuro: Alert and oriented X 3. Moves all extremities spontaneously. Gait is normal. CNII-XII grossly in tact. Psych:  Responds to questions appropriately with a normal affect.     ASSESSMENT AND PLAN:  73 y.o. year old female with  1. Diabetes type 2, uncontrolled  She is currently on Levemir 15 units q. AM. She is on no oral hyperglycemia medicines. She has history of intolerance with metformin secondary to diarrhea. We'll add Actos 30 mg daily. Discussed with her that she needs oral medication as insulin sensitizer. Last A1c was performed 01/19/13. Was 7.4. Check A1c now do so we will have this documented.   - Hemoglobin A1C, fingerstick - pioglitazone (ACTOS) 30 MG tablet; Take 1 tablet (30 mg total) by mouth daily.  Dispense: 30 tablet;  Refill: 3  2. Bradycardia I noted that the nurse got low pulse rate today. On patient's log sheet where she is keeping her blood sugars written down she is also writing down blood pressures and heart rates every day. Her readings are around 50. Also asked the patient whether they had mentioned this to her during her recent hospitalizations et [facility]. She says that she sees a heart doctor and just recently saw them. I reviewed their notes. Cardiology notes dated list bradycardia as one of their diagnoses. Dr. Harl Bowie was weaning down her clonidine. Cardiology can continue to manage this.  She is to schedule followup office visit in 3 months   Signed, Olean Ree [place], [place], [facility] 04/02/2013 10:57 AM

## 2013-04-08 ENCOUNTER — Other Ambulatory Visit: Payer: Self-pay | Admitting: Physician Assistant

## 2013-04-08 DIAGNOSIS — E785 Hyperlipidemia, unspecified: Secondary | ICD-10-CM

## 2013-04-09 NOTE — Telephone Encounter (Signed)
Medication refilled per protocol. 

## 2013-04-14 ENCOUNTER — Telehealth: Payer: Self-pay | Admitting: Family Medicine

## 2013-04-14 ENCOUNTER — Ambulatory Visit (HOSPITAL_COMMUNITY): Admission: RE | Admit: 2013-04-14 | Payer: Medicare HMO | Source: Ambulatory Visit

## 2013-04-14 NOTE — Telephone Encounter (Signed)
Patient feels current diabetic medications are not working.  States sugars are still high.  She says 200-300 pre meal with 80-90 FBS in am.  Made her an appt to come see provider to discuss.  Keep accurate log until appt and bring with her to app.

## 2013-04-16 ENCOUNTER — Telehealth: Payer: Self-pay | Admitting: Adult Health

## 2013-04-16 NOTE — Telephone Encounter (Signed)
Patient states that Laura Best d/c'd her oxygen but [place] will not come pick up her supplies until we fax them letter stating that she does not need it anymore. / tgs

## 2013-04-16 NOTE — Telephone Encounter (Signed)
Please advise 

## 2013-04-17 NOTE — Telephone Encounter (Signed)
Did we order the O2? Should be d/c'ed by PCP or whoever initiated the oxygen.

## 2013-04-17 NOTE — Telephone Encounter (Signed)
PLEASE FAX LETTER TO [phone].

## 2013-04-18 ENCOUNTER — Encounter: Payer: Self-pay | Admitting: *Deleted

## 2013-04-18 NOTE — Telephone Encounter (Signed)
Left message to call back  

## 2013-04-18 NOTE — Telephone Encounter (Signed)
Faxed

## 2013-04-18 NOTE — Telephone Encounter (Signed)
[place]. Get letter written and I will sign it. "per office walking O2 sats. No longer needs home O2."

## 2013-04-18 NOTE — Telephone Encounter (Signed)
Treid to call p,t left a message. Pt was arranged O2 by RN in hospital at discharge on 01/24/13. Please advise if you would like a letter written and faxed. Per LOV note on 04/14/13 stating she can have 02 picked up at home.

## 2013-04-23 ENCOUNTER — Ambulatory Visit: Payer: Self-pay | Admitting: Physician Assistant

## 2013-04-30 ENCOUNTER — Encounter: Payer: Self-pay | Admitting: Physician Assistant

## 2013-04-30 ENCOUNTER — Ambulatory Visit (INDEPENDENT_AMBULATORY_CARE_PROVIDER_SITE_OTHER): Payer: Medicare HMO | Admitting: Physician Assistant

## 2013-04-30 VITALS — BP 152/84 | HR 60 | Temp 97.0°F | Resp 18 | Wt 152.0 lb

## 2013-04-30 DIAGNOSIS — E1165 Type 2 diabetes mellitus with hyperglycemia: Secondary | ICD-10-CM

## 2013-04-30 DIAGNOSIS — IMO0001 Reserved for inherently not codable concepts without codable children: Secondary | ICD-10-CM

## 2013-04-30 DIAGNOSIS — IMO0002 Reserved for concepts with insufficient information to code with codable children: Secondary | ICD-10-CM

## 2013-04-30 NOTE — Progress Notes (Signed)
Patient ID: Laura Best MRN: [id number], DOB: 01/06/1941, 73 y.o. Date of Encounter: 73/08/2013, 1:15 PM    Chief Complaint:  Chief Complaint  Patient presents with  . feels sugars are high    brought log from home     HPI: 73 y.o. year old white female here to discuss high blood sugar readings.  Her last office visit with me was 04/02/13. At that time she also came in for that visit because she had been getting high blood sugar readings at home. At that visit as well, she brought in her blood sugar log sheet. At that time fasting blood sugar readings included 213, 210, 231, 221, 152. Lowest reading was 138 and highest was 231.  At that visit I reviewed that she was currently on Levemir 15 units every morning. At The time of that visit she was on no oral medications for diabetes. She had a history of intolerance to metformin secondary to diarrhea. At That visit we added Actos 30 mg daily. We rechecked an A1c.--was 7.9.  Today she does report that she did add the Actos 30 mg and is taking that daily as directed. As Well, she is still taking the Levemir 15 units every morning.  Today she brings in a notebook of blood sugar readings. As well she has her meter which she lets me flip through. Today at 11:27 AM she got 226. She says it prior to that reading she had eaten very little breakfast. Reading was checked prior to eating any lunch.  She gave her Levemir injection at 7:00 AM just like she always does"  On 04/29/13 she checked her blood sugar 6 times!!  I asked why  she was checking so often and she says because she was worried about it. On that Date she even checked it at 2:40 AM at which time it was 183 At 6:38 AM it was 154  at 8:27 AM 140  at 11:10 AM at 317 At 3:36 PM 199 At 7:00 PM 157  Meter says the seven-day average is 194 Meter says 14 day average is 219        Home Meds: See attached medication section for any medications that were entered at today's visit.  The computer does not put those onto this list.The following list is a list of meds entered prior to today's visit.   Current Outpatient Prescriptions on File Prior to Visit  Medication Sig Dispense Refill  . ACCU-CHEK AVIVA PLUS test strip       . ACCU-CHEK SOFTCLIX LANCETS lancets       . acetaminophen (TYLENOL) 500 MG tablet Take 500 mg by mouth every 6 (six) hours as needed for pain.      Marland Kitchen alendronate (FOSAMAX) 70 MG tablet Take 1 tablet (70 mg total) by mouth every 7 (seven) days. Take with a full glass of water on an empty stomach.  4 tablet  11  . amLODipine (NORVASC) 10 MG tablet Take 10 mg by mouth daily.      Marland Kitchen aspirin EC 81 MG EC tablet Take 1 tablet (81 mg total) by mouth daily.      . cholecalciferol (VITAMIN D) 1000 UNITS tablet Take 4,000 Units by mouth daily.       . cloNIDine (CATAPRES) 0.2 MG tablet Take 1 tablet (0.2 mg total) by mouth 2 (two) times daily.  60 tablet  6  . furosemide (LASIX) 40 MG tablet Take 1 tablet (40 mg total) by mouth  daily.  30 tablet  3  . Insulin Detemir (LEVEMIR) 100 UNIT/ML Pen Inject 15 Units into the skin every morning.  15 mL  11  . Insulin Pen Needle (PEN NEEDLES 31GX5/16") 31G X 8 MM MISC 1 each by Does not apply route daily.  30 each  11  . pioglitazone (ACTOS) 30 MG tablet Take 1 tablet (30 mg total) by mouth daily.  30 tablet  3  . simvastatin (ZOCOR) 20 MG tablet TAKE ONE TABLET BY MOUTH ONCE DAILY AT BEDTIME  90 tablet  0  . dextromethorphan-guaiFENesin (MUCINEX DM) 30-600 MG per 12 hr tablet Take 1 tablet by mouth 2 (two) times daily as needed for cough.       No current facility-administered medications on file prior to visit.    Allergies:  Allergies  Allergen Reactions  . Ace Inhibitors Other (See Comments)    Hyperkalemia  . Aspirin Other (See Comments)    G.I. Upset.   . Metformin And Related Diarrhea      Review of Systems: See HPI for pertinent ROS. All other ROS negative.    Physical Exam: Blood pressure 152/84,  pulse 60, temperature 97 F (36.1 C), temperature source Oral, resp. rate 18, weight 152 lb (68.947 kg)., Body mass index is 26.08 kg/(m^2). General:  WNWD WF. Appears in no acute distress. HEENT: Normocephalic, atraumatic, eyes without discharge, sclera non-icteric, nares are without discharge. Bilateral auditory canals clear, TM's are without perforation, pearly grey and translucent with reflective cone of light bilaterally. Oral cavity moist, posterior pharynx without exudate, erythema, peritonsillar abscess, or post nasal drip.  Neck: Supple. No thyromegaly. No lymphadenopathy. Lungs: Clear bilaterally to auscultation without wheezes, rales, or rhonchi. Breathing is unlabored. Heart: Regular rhythm. No murmurs, rubs, or gallops. Abdomen: Soft, non-tender, non-distended with normoactive bowel sounds. No hepatomegaly. No rebound/guarding. No obvious abdominal masses. Msk:  Strength and tone normal for age. Extremities/Skin: Warm and dry. No clubbing or cyanosis. No edema. No rashes or suspicious lesions. Neuro: Alert and oriented X 3. Moves all extremities spontaneously. Gait is normal. CNII-XII grossly in tact. Psych:  Responds to questions appropriately with a normal affect.     ASSESSMENT AND PLAN:  73 y.o. year old female with  1. Diabetes type 2, uncontrolled  Continue  Actos at 30 mg daily. Increase Levemir  one unit each day until fasting blood sugars get closer to goal. (80-120)   gave her a blood sugar log sheet and write down the number of units of Levemir next to the date and explained this to her. She voices understanding. She can wait until her next routine visit is due if things are going well. If She has any questions or concerns can call at any time or come back at any time.  I gave her another carbohydrate diet sheet. Discussed with her that these carbohydrates are the main foods that  affect her sugar. She says that she does already have one of these forms and she has  been monitoring her carbohydrates and is aware of this. She does eat a consistent amount of carbohydrates throughout the day. Says that she eats supper at about 6:00 PM and does not eat any type of snack after that. Asked that question specifically because I noticed that some of the fasting morning blood sugars would suddenly be closer down towards 90 and  the very next day would be closer to 180. Discussed with her that we have to make sure she does not develop any  low blood sugars while titrating her Levemir. Therefore needs to be very careful with consistent intake of carbohydrates. Again patient voices understanding. Again I told her to call me if she has any questions.O/W next ROV due in June.   Marin Olp [place], [place], [facility] 04/30/2013 1:15 PM

## 2013-05-09 ENCOUNTER — Telehealth: Payer: Self-pay | Admitting: Family Medicine

## 2013-05-09 NOTE — Telephone Encounter (Signed)
Doing well on 18 units of insulin.  Wanted to let you.  Has appt for June.

## 2013-05-19 ENCOUNTER — Encounter: Payer: Medicare HMO | Admitting: Adult Health

## 2013-06-12 ENCOUNTER — Other Ambulatory Visit: Payer: Self-pay | Admitting: Physician Assistant

## 2013-06-12 NOTE — Telephone Encounter (Signed)
Refill appropriate and filled per protocol. 

## 2013-06-18 ENCOUNTER — Telehealth: Payer: Self-pay | Admitting: Family Medicine

## 2013-06-18 NOTE — Telephone Encounter (Signed)
Received notice from insurance.  They question patients adherence with her Alendronate 70 mg weekly for her Osteoporosis.  I called patient and she said she is taking it weekly and has it filled at her local [place].

## 2013-07-03 ENCOUNTER — Ambulatory Visit (INDEPENDENT_AMBULATORY_CARE_PROVIDER_SITE_OTHER): Payer: Medicare HMO | Admitting: Physician Assistant

## 2013-07-03 ENCOUNTER — Ambulatory Visit: Payer: Self-pay | Admitting: Physician Assistant

## 2013-07-03 ENCOUNTER — Encounter: Payer: Self-pay | Admitting: Physician Assistant

## 2013-07-03 VITALS — BP 142/76 | HR 60 | Temp 98.7°F | Resp 18 | Wt 164.0 lb

## 2013-07-03 DIAGNOSIS — IMO0002 Reserved for concepts with insufficient information to code with codable children: Secondary | ICD-10-CM

## 2013-07-03 DIAGNOSIS — Z1212 Encounter for screening for malignant neoplasm of rectum: Secondary | ICD-10-CM

## 2013-07-03 DIAGNOSIS — I498 Other specified cardiac arrhythmias: Secondary | ICD-10-CM

## 2013-07-03 DIAGNOSIS — I10 Essential (primary) hypertension: Secondary | ICD-10-CM

## 2013-07-03 DIAGNOSIS — I509 Heart failure, unspecified: Secondary | ICD-10-CM

## 2013-07-03 DIAGNOSIS — E785 Hyperlipidemia, unspecified: Secondary | ICD-10-CM

## 2013-07-03 DIAGNOSIS — N183 Chronic kidney disease, stage 3 unspecified: Secondary | ICD-10-CM

## 2013-07-03 DIAGNOSIS — Z1239 Encounter for other screening for malignant neoplasm of breast: Secondary | ICD-10-CM

## 2013-07-03 DIAGNOSIS — E559 Vitamin D deficiency, unspecified: Secondary | ICD-10-CM

## 2013-07-03 DIAGNOSIS — R001 Bradycardia, unspecified: Secondary | ICD-10-CM

## 2013-07-03 DIAGNOSIS — Z1211 Encounter for screening for malignant neoplasm of colon: Secondary | ICD-10-CM

## 2013-07-03 DIAGNOSIS — M810 Age-related osteoporosis without current pathological fracture: Secondary | ICD-10-CM

## 2013-07-03 DIAGNOSIS — E1165 Type 2 diabetes mellitus with hyperglycemia: Secondary | ICD-10-CM

## 2013-07-03 DIAGNOSIS — IMO0001 Reserved for inherently not codable concepts without codable children: Secondary | ICD-10-CM

## 2013-07-04 ENCOUNTER — Other Ambulatory Visit: Payer: Medicare HMO

## 2013-07-04 DIAGNOSIS — E785 Hyperlipidemia, unspecified: Secondary | ICD-10-CM

## 2013-07-04 DIAGNOSIS — IMO0002 Reserved for concepts with insufficient information to code with codable children: Secondary | ICD-10-CM

## 2013-07-04 DIAGNOSIS — N183 Chronic kidney disease, stage 3 unspecified: Secondary | ICD-10-CM

## 2013-07-04 DIAGNOSIS — E1165 Type 2 diabetes mellitus with hyperglycemia: Secondary | ICD-10-CM

## 2013-07-04 DIAGNOSIS — I10 Essential (primary) hypertension: Secondary | ICD-10-CM

## 2013-07-04 LAB — COMPLETE METABOLIC PANEL WITH GFR
ALT: 18 U/L (ref 0–35)
AST: 21 U/L (ref 0–37)
Albumin: 4.1 g/dL (ref 3.5–5.2)
Alkaline Phosphatase: 60 U/L (ref 39–117)
BILIRUBIN TOTAL: 0.4 mg/dL (ref 0.2–1.2)
BUN: 32 mg/dL — AB (ref 6–23)
CALCIUM: 8.6 mg/dL (ref 8.4–10.5)
CHLORIDE: 104 meq/L (ref 96–112)
CO2: 26 meq/L (ref 19–32)
Creat: 1.83 mg/dL — ABNORMAL HIGH (ref 0.50–1.10)
GFR, EST AFRICAN AMERICAN: 31 mL/min — AB
GFR, Est Non African American: 27 mL/min — ABNORMAL LOW
Glucose, Bld: 92 mg/dL (ref 70–99)
Potassium: 4.6 mEq/L (ref 3.5–5.3)
Sodium: 141 mEq/L (ref 135–145)
Total Protein: 6.8 g/dL (ref 6.0–8.3)

## 2013-07-04 LAB — MICROALBUMIN, URINE: MICROALB UR: 12.6 mg/dL — AB (ref 0.00–1.89)

## 2013-07-04 LAB — LIPID PANEL
CHOLESTEROL: 139 mg/dL (ref 0–200)
HDL: 58 mg/dL (ref >39)
LDL Cholesterol: 67 mg/dL (ref 0–99)
TRIGLYCERIDES: 70 mg/dL (ref <150)
Total CHOL/HDL Ratio: 2.4 Ratio
VLDL: 14 mg/dL (ref 0–40)

## 2013-07-04 LAB — HEMOGLOBIN A1C
Hgb A1c MFr Bld: 7.7 % — ABNORMAL HIGH (ref <5.7)
Mean Plasma Glucose: 174 mg/dL — ABNORMAL HIGH (ref <117)

## 2013-07-04 NOTE — Progress Notes (Signed)
Patient ID: Laura Best MRN: [id number], DOB: Oct 15, 1940, 73 y.o. Date of Encounter: @DATE @  Chief Complaint:  Chief Complaint  Patient presents with  . 3 mth check up    HPI: 73 y.o. year old white female  presents for f/u OV.   I had been seeing her routinely in the past until March 2012. After that visit March 2012, I did not see her until 06/13/12. At that time, she told me that even though she had insurance, she did not have the money to pay her co-pay so she had not come in for any office visits during that time. She says that she only has to pay a $15 co-pay when she comes here. She says that her finances are better now. Says that she has paid off her son's truck and she no longer has that payment going out  every month.  When she came 06/13/12 she reported that she had  been continuing to use Lantus 15 units every morning through that entire period of time. She also reported that she had been seeing [facility] and they  were managing her blood pressure and cholesterol during that time.  She was hospitalized 07/19/12 through 07/25/12. She had appendiceal abscess and underwent surgery by Dr. Hulen Skains.   TODAY: Today she brings in her notebook where she documents blood pressures and pulse readings every day. She has brought this notebook with her to the last several office visits. Pulae ranges in the 50s and 60s. Most of her blood pressure readings are 120s over 60s. She also has blood sugar readings documented on blood sugar log sheet. Fasting morning readings range mostly from 78-128. Only has 2 readings from before lunch and these were 125. Readings 2 hours after lunch range from 156-218. Readings before dinner -- 90, 99, 106. Nighttime readings are mostly good she did have one reading as high as 263 but most are lower than as in range from more of 135-185.  She has no complaints today.    Past Medical History  Diagnosis Date  . Diabetes mellitus   . Hypertension     . Hyperlipidemia   . Tricuspid regurgitation     Echo 05/02/06-Nml LV. Mild MR. Mod TR  . Osteoporosis   . Diastolic heart failure   . Vitamin D deficiency   . Osteoporosis   . CKD (chronic kidney disease) stage 3, GFR 30-59 ml/min 12/25/2012     Home Meds:  Outpatient Prescriptions Prior to Visit  Medication Sig Dispense Refill  . ACCU-CHEK AVIVA PLUS test strip       . ACCU-CHEK SOFTCLIX LANCETS lancets       . acetaminophen (TYLENOL) 500 MG tablet Take 500 mg by mouth every 6 (six) hours as needed for pain.      Marland Kitchen alendronate (FOSAMAX) 70 MG tablet Take 1 tablet (70 mg total) by mouth every 7 (seven) days. Take with a full glass of water on an empty stomach.  4 tablet  11  . amLODipine (NORVASC) 10 MG tablet Take 10 mg by mouth daily.      Marland Kitchen aspirin EC 81 MG EC tablet Take 1 tablet (81 mg total) by mouth daily.      . cholecalciferol (VITAMIN D) 1000 UNITS tablet Take 4,000 Units by mouth daily.       . cloNIDine (CATAPRES) 0.2 MG tablet Take 1 tablet (0.2 mg total) by mouth 2 (two) times daily.  60 tablet  6  . furosemide (  LASIX) 40 MG tablet TAKE ONE TABLET BY MOUTH ONCE DAILY  30 tablet  3  . Insulin Pen Needle (PEN NEEDLES 31GX5/16") 31G X 8 MM MISC 1 each by Does not apply route daily.  30 each  11  . pioglitazone (ACTOS) 30 MG tablet Take 1 tablet (30 mg total) by mouth daily.  30 tablet  3  . simvastatin (ZOCOR) 20 MG tablet TAKE ONE TABLET BY MOUTH ONCE DAILY AT BEDTIME  90 tablet  0  . Insulin Detemir (LEVEMIR) 100 UNIT/ML Pen Inject 15 Units into the skin every morning.  15 mL  11  . dextromethorphan-guaiFENesin (MUCINEX DM) 30-600 MG per 12 hr tablet Take 1 tablet by mouth 2 (two) times daily as needed for cough.       No facility-administered medications prior to visit.     Allergies:  Allergies  Allergen Reactions  . Ace Inhibitors Other (See Comments)    Hyperkalemia  . Aspirin Other (See Comments)    G.I. Upset.   . Metformin And Related Diarrhea    History    Social History  . Marital Status: Widowed    Spouse Name: N/A    Number of Children: N/A  . Years of Education: N/A   Occupational History  . Not on file.   Social History Main Topics  . Smoking status: Former Smoker    Quit date: 01/19/2009  . Smokeless tobacco: Never Used  . Alcohol Use: No  . Drug Use: No  . Sexual Activity: Not on file   Other Topics Concern  . Not on file   Social History Narrative  . No narrative on file    Family History  Problem Relation Age of Onset  . Cancer Father   . Diabetes Father   . Cancer Brother     Brain  . Cancer Sister     abdominal fat     Review of Systems:  See HPI for pertinent ROS. All other ROS negative.    Physical Exam: Blood pressure 142/76, pulse 60, temperature 98.7 F (37.1 C), temperature source Oral, resp. rate 18, weight 164 lb (74.39 kg)., Body mass index is 28.14 kg/(m^2). General: WNWD WF. Appears in no acute distress. Neck: Supple. No thyromegaly. No lymphadenopathy.No carotid bruits. Lungs: Clear bilaterally to auscultation without wheezes, rales, or rhonchi. Breathing is unlabored. Heart: RRR with S1 S2. No murmurs, rubs, or gallops. Abdomen: Soft, non-tender, non-distended with normoactive bowel sounds. No hepatomegaly. No rebound/guarding. No obvious abdominal masses. Musculoskeletal:  Strength and tone normal for age. Extremities/Skin: Warm and dry. No clubbing or cyanosis. No edema. No rashes or suspicious lesions. Neuro: Alert and oriented X 3. Moves all extremities spontaneously. Gait is normal. CNII-XII grossly in tact. Psych:  Responds to questions appropriately with a normal affect. Diabetic foot exam:  Inspection is normal. No open wounds or nonhealing sores. Sensation testing is normal. There are no palpable dorsalis pedis or posterior tibial pulses bilaterally.      ASSESSMENT AND PLAN:  73 y.o. year old female with  1. Diabetes type 2, uncontrolled  She is on ACE inhibitor therapy. On  maximum dose. Takes 20 mg twice a day. We checked lipid panel 12/16/12 started statin therapy. She says that she is taking the simvastatin as directed. However she never came back in for her followup labs. He is not fasting today but did check with her son who is the one who transports her and he can bring her back tomorrow morning fasting. She  will return if more morning for fasting lipids and LFTs.  She cannot take metformin secondary to past GI adverse effects and also now she has chronic kidney disease which prohibits use of this.   At [place] 12/14 I discussed with her that she needs to have an annual diabetic eye exam. AT NEXT OV I NEED TO F/U TO SEE IF SHE HAD THIS  Today I discussed proper foot care. Discussed the effects of the diabetes has on her blood vessels as bar as blood flow to her feet. Also discussed the effects it has on no nerves. Discussed that she may have decreased sensation and not feel pain and sores as a normal person would. Therefore needs to wear shoes and be very protective of her feet. Also discussed proper care regarding trimming her toenails etc. If she ever does see any small sores developing that she needs to come in for evaluation immediately.  Discussed need for proper exercise. She states she gets outside and walks whenever possible. She definitely does this when the weather is nice and says even if it is somewhat cold she still tries to get out of remain active.  We have discussed carbohydrate diet in the past that have been a long time. I reviewed a low carbohydrate diet handout with her and discussed details of specific examples of things to avoid as well as gave her examples of meals that she can eat.   2. Hypertension  At prior visit with me I reviewed her last office note by cardiology dated 06/2012. They had performed a renal artery ultrasound which was negative. In their notes they documented that they reviewed the fact that she has labile blood pressure  readings.  They also felt the patient had white coat hypertension.   She is already on max dose ACE inhibitor. At [place] 12/2012 I noted and did: Her heart rate is low so I do not think I should add a beta blocker. Bystolic is going to be expensive. i will Increase her clonidine. She will stop using the patch and switch over to pill form. - cloNIDine (CATAPRES) 0.3 MG tablet; Take 1 tablet (0.3 mg total) by mouth 2 (two) times daily.  Dispense: 60 tablet; Refill: 5  3. CKD (chronic kidney disease) stage 3, GFR 30-59 ml/min Discussed her creatinine and GFR with her today. AlSo her last  microalbumin was very high. At the time that I got her lab results from 12/16/12, on that result note I stated to stop the HCTZ. She states that she has stopped this. I discussed with her that the 2 most common causes for kidney disease are uncontrolled hypertension and uncontrolled diabetes.  She had an excellent A1c though I know her sugar is controlled. Therefore I believe her kidney disease is secondary to uncontrolled hypertension. She is already on maximum dose ACE inhibitor. At [place] 12/2012 I noted: Even though she does have labile blood pressure readings,  none of them are too low. Therefore I think even if we increase the medication, she will not have hypotension or lightheadedness.     - cloNIDine (CATAPRES) 0.3 MG tablet; Take 1 tablet (0.3 mg total) by mouth 2 (two) times daily.  Dispense: 60 tablet; Refill: 5  4. Hyperlipidemia She had FLP/LFT 12/16/12. I recommended she start simvastatin 20 mg. She says that she is taking the medication as directed. However never came in for her followup labs. She will return tomorrow morning fasting to check this.   5. Osteoporosis  at her last visit with me 12/16/12, reviewed her chart and saw that she had been on Fosamax in the past. However it was not on her current medication list as of the appointment in November. She says that she stopped this medication back  when she was unable to come here for followup secondary to finances. She was quite certain that she did not have any adverse effects or any other reasons for stopping the medication other than finances. At the visit 12/16/12, she was agreeable to restart this.  she has restarted the Fosamax. WE ALSO DISCUSSED THAT SHE IS OVERDUE FOR DEXA/BMD--SHE SAYS SHE WILL HAVE TO TALK TO [facility] REGARDING THEIR SCHEDULES --THEY ARE HER TRANSPORTATION--THEN SHE WILL CALL ME--DOES NOT WANT ME TO PLACE ORDER UNTIL SHE CHECKS WITH TRANSPORTATION ISSUES.   6. Vitamin D deficiency She had been taking 1000 units daily. At this check a level on 12/16/2012 and it was low. Told her to increase to 4000 units daily. At [place] 12/2012 she said that she forgot about making this change but will go home to start taking the 4000 units daily now.  7. Pulmonary hypertension  8. COPD (chronic obstructive pulmonary disease) Quit smoking 4 years ago.  9. H/O Normocytic anemia   10. Breast cancer screening AT OV 12/2012  she reported she had not had a mammogram in years. She was agreeable to go have one. I placed order for mammogram at that [place]   11. Screening for colorectal cancer She states that the last colonoscopy was less than 10 years ago. At that time she was told she can repeat 10 years. Not due yet.  12. Immunizations: Pneumonia vaccine: She received the Pneumovax 11/30/09.   Prevnar 13 --given here 12/2012.  Tetanus: None documented in Epic. None documented in her paper chart. Patient not cannot recall having one in the last 10 years. We gave here 12/2012. Zostavax: Told her to contact her insurance company to find out her cost for this. If she wishes to proceed , she can call us and we can send an order in to the pharmacy for her to go there for the injection. Influenza vaccine: Was given here a 12/16/12.   Check a microalbumin today. Return to office fasting tomorrow morning for labs. Check CMET, FLP,  A1c. WE ALSO DISCUSSED THAT SHE IS OVERDUE FOR DEXA/BMD--SHE SAYS SHE WILL HAVE TO TALK TO [facility]ILY [facility] REGARDING THEIR SCHEDULES --THEY ARE HER TRANSPORTATION--THEN SHE WILL CALL ME--DOES NOT WANT ME TO PLACE ORDER UNTIL SHE CHECKS WITH TRANSPORTATION ISSUES.  ROV 3 months, sooner if needed.   Marin Olp U[place]es, [place], [facility] 07/04/2013 7:18 AM

## 2013-07-22 ENCOUNTER — Other Ambulatory Visit: Payer: Self-pay | Admitting: Physician Assistant

## 2013-07-22 NOTE — Telephone Encounter (Signed)
Refill appropriate and filled per protocol. 

## 2013-09-27 ENCOUNTER — Telehealth (INDEPENDENT_AMBULATORY_CARE_PROVIDER_SITE_OTHER): Payer: Self-pay | Admitting: Surgery

## 2013-09-27 ENCOUNTER — Inpatient Hospital Stay (HOSPITAL_COMMUNITY)
Admission: EM | Admit: 2013-09-27 | Discharge: 2013-10-03 | DRG: 339 | Disposition: A | Discharge status: Home Health | Payer: Medicare HMO | Attending: Surgery | Admitting: Surgery

## 2013-09-27 ENCOUNTER — Emergency Department (HOSPITAL_COMMUNITY): Payer: Medicare HMO

## 2013-09-27 ENCOUNTER — Encounter (HOSPITAL_COMMUNITY): Payer: Medicare HMO | Admitting: Anesthesiology

## 2013-09-27 ENCOUNTER — Encounter (HOSPITAL_COMMUNITY): Admission: EM | Disposition: A | Discharge status: Home Health | Payer: Self-pay | Source: Home / Self Care

## 2013-09-27 ENCOUNTER — Encounter (HOSPITAL_COMMUNITY): Payer: Self-pay | Admitting: Emergency Medicine

## 2013-09-27 ENCOUNTER — Inpatient Hospital Stay (HOSPITAL_COMMUNITY): Payer: Medicare HMO

## 2013-09-27 ENCOUNTER — Emergency Department (HOSPITAL_COMMUNITY): Payer: Medicare HMO | Admitting: Anesthesiology

## 2013-09-27 DIAGNOSIS — E559 Vitamin D deficiency, unspecified: Secondary | ICD-10-CM | POA: Diagnosis present

## 2013-09-27 DIAGNOSIS — J4489 Other specified chronic obstructive pulmonary disease: Secondary | ICD-10-CM | POA: Diagnosis present

## 2013-09-27 DIAGNOSIS — R339 Retention of urine, unspecified: Secondary | ICD-10-CM | POA: Diagnosis not present

## 2013-09-27 DIAGNOSIS — Z79899 Other long term (current) drug therapy: Secondary | ICD-10-CM | POA: Diagnosis not present

## 2013-09-27 DIAGNOSIS — K353 Acute appendicitis with localized peritonitis, without perforation or gangrene: Secondary | ICD-10-CM

## 2013-09-27 DIAGNOSIS — N183 Chronic kidney disease, stage 3 unspecified: Secondary | ICD-10-CM | POA: Diagnosis present

## 2013-09-27 DIAGNOSIS — Z5331 Laparoscopic surgical procedure converted to open procedure: Secondary | ICD-10-CM | POA: Diagnosis not present

## 2013-09-27 DIAGNOSIS — I079 Rheumatic tricuspid valve disease, unspecified: Secondary | ICD-10-CM | POA: Diagnosis present

## 2013-09-27 DIAGNOSIS — Z794 Long term (current) use of insulin: Secondary | ICD-10-CM | POA: Diagnosis not present

## 2013-09-27 DIAGNOSIS — E785 Hyperlipidemia, unspecified: Secondary | ICD-10-CM

## 2013-09-27 DIAGNOSIS — IMO0002 Reserved for concepts with insufficient information to code with codable children: Secondary | ICD-10-CM

## 2013-09-27 DIAGNOSIS — I509 Heart failure, unspecified: Secondary | ICD-10-CM | POA: Diagnosis present

## 2013-09-27 DIAGNOSIS — I5032 Chronic diastolic (congestive) heart failure: Secondary | ICD-10-CM | POA: Diagnosis present

## 2013-09-27 DIAGNOSIS — E1165 Type 2 diabetes mellitus with hyperglycemia: Secondary | ICD-10-CM

## 2013-09-27 DIAGNOSIS — I2789 Other specified pulmonary heart diseases: Secondary | ICD-10-CM | POA: Diagnosis present

## 2013-09-27 DIAGNOSIS — I10 Essential (primary) hypertension: Secondary | ICD-10-CM

## 2013-09-27 DIAGNOSIS — Z23 Encounter for immunization: Secondary | ICD-10-CM

## 2013-09-27 DIAGNOSIS — IMO0001 Reserved for inherently not codable concepts without codable children: Secondary | ICD-10-CM | POA: Diagnosis present

## 2013-09-27 DIAGNOSIS — Z87891 Personal history of nicotine dependence: Secondary | ICD-10-CM

## 2013-09-27 DIAGNOSIS — K3533 Acute appendicitis with perforation and localized peritonitis, with abscess: Secondary | ICD-10-CM | POA: Diagnosis present

## 2013-09-27 DIAGNOSIS — Z7982 Long term (current) use of aspirin: Secondary | ICD-10-CM | POA: Diagnosis not present

## 2013-09-27 DIAGNOSIS — N179 Acute kidney failure, unspecified: Secondary | ICD-10-CM | POA: Diagnosis not present

## 2013-09-27 DIAGNOSIS — R109 Unspecified abdominal pain: Secondary | ICD-10-CM

## 2013-09-27 DIAGNOSIS — I129 Hypertensive chronic kidney disease with stage 1 through stage 4 chronic kidney disease, or unspecified chronic kidney disease: Secondary | ICD-10-CM | POA: Diagnosis present

## 2013-09-27 DIAGNOSIS — J449 Chronic obstructive pulmonary disease, unspecified: Secondary | ICD-10-CM | POA: Diagnosis present

## 2013-09-27 DIAGNOSIS — I5030 Unspecified diastolic (congestive) heart failure: Secondary | ICD-10-CM

## 2013-09-27 DIAGNOSIS — E1121 Type 2 diabetes mellitus with diabetic nephropathy: Secondary | ICD-10-CM | POA: Diagnosis present

## 2013-09-27 HISTORY — PX: LAPAROSCOPIC APPENDECTOMY: SHX408

## 2013-09-27 LAB — COMPREHENSIVE METABOLIC PANEL
ALK PHOS: 58 U/L (ref 39–117)
ALT: 10 U/L (ref 0–35)
AST: 13 U/L (ref 0–37)
Albumin: 3.5 g/dL (ref 3.5–5.2)
Anion gap: 14 (ref 5–15)
BILIRUBIN TOTAL: 0.4 mg/dL (ref 0.3–1.2)
BUN: 37 mg/dL — ABNORMAL HIGH (ref 6–23)
CHLORIDE: 99 meq/L (ref 96–112)
CO2: 23 meq/L (ref 19–32)
Calcium: 9 mg/dL (ref 8.4–10.5)
Creatinine, Ser: 1.7 mg/dL — ABNORMAL HIGH (ref 0.50–1.10)
GFR, EST AFRICAN AMERICAN: 33 mL/min — AB (ref >90)
GFR, EST NON AFRICAN AMERICAN: 29 mL/min — AB (ref >90)
Glucose, Bld: 235 mg/dL — ABNORMAL HIGH (ref 70–99)
Potassium: 3.8 mEq/L (ref 3.7–5.3)
SODIUM: 136 meq/L — AB (ref 137–147)
Total Protein: 7.2 g/dL (ref 6.0–8.3)

## 2013-09-27 LAB — CBC WITH DIFFERENTIAL/PLATELET
BASOS ABS: 0 10*3/uL (ref 0.0–0.1)
Basophils Relative: 0 % (ref 0–1)
Eosinophils Absolute: 0 10*3/uL (ref 0.0–0.7)
Eosinophils Relative: 0 % (ref 0–5)
HEMATOCRIT: 29.9 % — AB (ref 36.0–46.0)
Hemoglobin: 9.9 g/dL — ABNORMAL LOW (ref 12.0–15.0)
LYMPHS ABS: 0.7 10*3/uL (ref 0.7–4.0)
LYMPHS PCT: 6 % — AB (ref 12–46)
MCH: 28 pg (ref 26.0–34.0)
MCHC: 33.1 g/dL (ref 30.0–36.0)
MCV: 84.7 fL (ref 78.0–100.0)
MONO ABS: 0.7 10*3/uL (ref 0.1–1.0)
Monocytes Relative: 6 % (ref 3–12)
Neutro Abs: 10.6 10*3/uL — ABNORMAL HIGH (ref 1.7–7.7)
Neutrophils Relative %: 88 % — ABNORMAL HIGH (ref 43–77)
PLATELETS: 188 10*3/uL (ref 150–400)
RBC: 3.53 MIL/uL — AB (ref 3.87–5.11)
RDW: 15.3 % (ref 11.5–15.5)
WBC: 12 10*3/uL — AB (ref 4.0–10.5)

## 2013-09-27 LAB — LIPASE, BLOOD: Lipase: 16 U/L (ref 11–59)

## 2013-09-27 LAB — I-STAT CG4 LACTIC ACID, ED: Lactic Acid, Venous: 0.78 mmol/L (ref 0.5–2.2)

## 2013-09-27 LAB — GLUCOSE, CAPILLARY: Glucose-Capillary: 205 mg/dL — ABNORMAL HIGH (ref 70–99)

## 2013-09-27 SURGERY — APPENDECTOMY, LAPAROSCOPIC
Anesthesia: General | Site: Abdomen

## 2013-09-27 MED ORDER — OXYCODONE HCL 5 MG PO TABS: 5.0000 mg | ORAL_TABLET | Freq: Once | ORAL | Status: AC | PRN

## 2013-09-27 MED ORDER — SODIUM CHLORIDE 0.9 % IJ SOLN
INTRAMUSCULAR | Status: AC
  Filled 2013-09-27: qty 10

## 2013-09-27 MED ORDER — ARTIFICIAL TEARS OP OINT
TOPICAL_OINTMENT | OPHTHALMIC | Status: AC
  Filled 2013-09-27: qty 3.5

## 2013-09-27 MED ORDER — SUCCINYLCHOLINE CHLORIDE 20 MG/ML IJ SOLN
INTRAMUSCULAR | Status: AC
  Filled 2013-09-27: qty 2

## 2013-09-27 MED ORDER — FENTANYL CITRATE 0.05 MG/ML IJ SOLN
INTRAMUSCULAR | Status: AC
  Filled 2013-09-27: qty 5

## 2013-09-27 MED ORDER — SUCCINYLCHOLINE CHLORIDE 20 MG/ML IJ SOLN
INTRAMUSCULAR | Status: DC | PRN
  Administered 2013-09-27: 60 mg via INTRAVENOUS

## 2013-09-27 MED ORDER — FENTANYL CITRATE 0.05 MG/ML IJ SOLN
INTRAMUSCULAR | Status: DC | PRN
  Administered 2013-09-27 (×3): 50 ug via INTRAVENOUS

## 2013-09-27 MED ORDER — SODIUM CHLORIDE 0.9 % IR SOLN
Status: DC | PRN
  Administered 2013-09-27: 1000 mL

## 2013-09-27 MED ORDER — SODIUM CHLORIDE 0.9 % IV BOLUS (SEPSIS)
500.0000 mL | Freq: Once | INTRAVENOUS | Status: AC
  Administered 2013-09-27: 500 mL via INTRAVENOUS

## 2013-09-27 MED ORDER — PROPOFOL 10 MG/ML IV BOLUS
INTRAVENOUS | Status: DC | PRN
  Administered 2013-09-27: 140 mg via INTRAVENOUS

## 2013-09-27 MED ORDER — 0.9 % SODIUM CHLORIDE (POUR BTL) OPTIME
TOPICAL | Status: DC | PRN
  Administered 2013-09-27 (×3): 1000 mL

## 2013-09-27 MED ORDER — ONDANSETRON HCL 4 MG/2ML IJ SOLN
4.0000 mg | Freq: Once | INTRAMUSCULAR | Status: AC
  Administered 2013-09-27: 4 mg via INTRAVENOUS
  Filled 2013-09-27: qty 2

## 2013-09-27 MED ORDER — MIDAZOLAM HCL 2 MG/2ML IJ SOLN
INTRAMUSCULAR | Status: AC
  Filled 2013-09-27: qty 2

## 2013-09-27 MED ORDER — INSULIN ASPART 100 UNIT/ML ~~LOC~~ SOLN
6.0000 [IU] | Freq: Once | SUBCUTANEOUS | Status: AC
  Administered 2013-09-27: 6 [IU] via INTRAVENOUS
  Filled 2013-09-27: qty 0.06

## 2013-09-27 MED ORDER — BUPIVACAINE-EPINEPHRINE (PF) 0.25% -1:200000 IJ SOLN
INTRAMUSCULAR | Status: AC
  Filled 2013-09-27: qty 30

## 2013-09-27 MED ORDER — IOHEXOL 300 MG/ML  SOLN
25.0000 mL | Freq: Once | INTRAMUSCULAR | Status: AC | PRN
  Administered 2013-09-27: 25 mL via ORAL

## 2013-09-27 MED ORDER — PIPERACILLIN-TAZOBACTAM 3.375 G IVPB 30 MIN
INTRAVENOUS | Status: DC | PRN
  Administered 2013-09-27: 3.375 g via INTRAVENOUS

## 2013-09-27 MED ORDER — NEOSTIGMINE METHYLSULFATE 10 MG/10ML IV SOLN
INTRAVENOUS | Status: DC | PRN
  Administered 2013-09-27: 5 mg via INTRAVENOUS

## 2013-09-27 MED ORDER — VECURONIUM BROMIDE 10 MG IV SOLR
INTRAVENOUS | Status: DC | PRN
  Administered 2013-09-27: 2 mg via INTRAVENOUS
  Administered 2013-09-27: 4 mg via INTRAVENOUS

## 2013-09-27 MED ORDER — OXYCODONE HCL 5 MG/5ML PO SOLN: 5.0000 mg | Freq: Once | ORAL | Status: AC | PRN

## 2013-09-27 MED ORDER — PROPOFOL 10 MG/ML IV BOLUS
INTRAVENOUS | Status: AC
  Filled 2013-09-27: qty 20

## 2013-09-27 MED ORDER — SODIUM CHLORIDE 0.9 % IV SOLN
INTRAVENOUS | Status: DC
  Administered 2013-09-27 (×3): via INTRAVENOUS

## 2013-09-27 MED ORDER — ONDANSETRON HCL 4 MG/2ML IJ SOLN
INTRAMUSCULAR | Status: AC
  Filled 2013-09-27: qty 2

## 2013-09-27 MED ORDER — PIPERACILLIN-TAZOBACTAM 3.375 G IVPB
3.3750 g | Freq: Once | INTRAVENOUS | Status: AC
  Administered 2013-09-27: 3.375 g via INTRAVENOUS
  Filled 2013-09-27: qty 50

## 2013-09-27 MED ORDER — ONDANSETRON HCL 4 MG/2ML IJ SOLN
INTRAMUSCULAR | Status: DC | PRN
  Administered 2013-09-27: 4 mg via INTRAVENOUS

## 2013-09-27 MED ORDER — SODIUM CHLORIDE 0.9 % IV SOLN
INTRAVENOUS | Status: AC
  Administered 2013-09-28 (×2): via INTRAVENOUS

## 2013-09-27 MED ORDER — ARTIFICIAL TEARS OP OINT
TOPICAL_OINTMENT | OPHTHALMIC | Status: DC | PRN
  Administered 2013-09-27: 1 via OPHTHALMIC

## 2013-09-27 MED ORDER — HYDROMORPHONE HCL PF 1 MG/ML IJ SOLN
1.0000 mg | Freq: Once | INTRAMUSCULAR | Status: AC
  Administered 2013-09-27: 1 mg via INTRAVENOUS
  Filled 2013-09-27: qty 1

## 2013-09-27 MED ORDER — GLYCOPYRROLATE 0.2 MG/ML IJ SOLN
INTRAMUSCULAR | Status: DC | PRN
  Administered 2013-09-27: 0.6 mg via INTRAVENOUS

## 2013-09-27 MED ORDER — VECURONIUM BROMIDE 10 MG IV SOLR
INTRAVENOUS | Status: AC
  Filled 2013-09-27: qty 10

## 2013-09-27 MED ORDER — HYDROMORPHONE HCL PF 1 MG/ML IJ SOLN
0.2500 mg | INTRAMUSCULAR | Status: DC | PRN
  Administered 2013-09-28 (×4): 0.5 mg via INTRAVENOUS

## 2013-09-27 MED ORDER — EPHEDRINE SULFATE 50 MG/ML IJ SOLN
INTRAMUSCULAR | Status: AC
  Filled 2013-09-27: qty 1

## 2013-09-27 MED ORDER — ONDANSETRON HCL 4 MG/2ML IJ SOLN
4.0000 mg | Freq: Once | INTRAMUSCULAR | Status: AC
Start: 2013-09-27 — End: 2013-09-27
  Administered 2013-09-27: 4 mg via INTRAVENOUS
  Filled 2013-09-27: qty 2

## 2013-09-27 MED ORDER — BUPIVACAINE-EPINEPHRINE 0.25% -1:200000 IJ SOLN
INTRAMUSCULAR | Status: DC | PRN
  Administered 2013-09-27: 20 mL

## 2013-09-27 MED ORDER — STERILE WATER FOR INJECTION IJ SOLN
INTRAMUSCULAR | Status: AC
  Filled 2013-09-27: qty 10

## 2013-09-27 SURGICAL SUPPLY — 56 items
APL SKNCLS STERI-STRIP NONHPOA (GAUZE/BANDAGES/DRESSINGS) ×1
BAG SPEC RTRVL LRG 6X4 10 (ENDOMECHANICALS) ×1
BENZOIN TINCTURE PRP APPL 2/3 (GAUZE/BANDAGES/DRESSINGS) ×2 IMPLANT
CANISTER SUCTION 2500CC (MISCELLANEOUS) ×2 IMPLANT
CHLORAPREP W/TINT 26ML (MISCELLANEOUS) ×2 IMPLANT
CLEANER TIP ELECTROSURG 2X2 (MISCELLANEOUS) ×1 IMPLANT
CLSR STERI-STRIP ANTIMIC 1/2X4 (GAUZE/BANDAGES/DRESSINGS) ×1 IMPLANT
COVER SURGICAL LIGHT HANDLE (MISCELLANEOUS) ×2 IMPLANT
CUTTER FLEX LINEAR 45M (STAPLE) ×2 IMPLANT
DRAPE UTILITY 15X26 W/TAPE STR (DRAPE) ×4 IMPLANT
DRSG TEGADERM 2-3/8X2-3/4 SM (GAUZE/BANDAGES/DRESSINGS) ×3 IMPLANT
DRSG TEGADERM 4X4.75 (GAUZE/BANDAGES/DRESSINGS) ×2 IMPLANT
DRSG VAC ATS SM SENSATRAC (GAUZE/BANDAGES/DRESSINGS) ×1 IMPLANT
ELECT CAUTERY BLADE 6.4 (BLADE) ×1 IMPLANT
ELECT REM PT RETURN 9FT ADLT (ELECTROSURGICAL) ×2
ELECTRODE REM PT RTRN 9FT ADLT (ELECTROSURGICAL) ×1 IMPLANT
FILTER SMOKE EVAC LAPAROSHD (FILTER) ×2 IMPLANT
GAUZE SPONGE 2X2 8PLY STRL LF (GAUZE/BANDAGES/DRESSINGS) ×1 IMPLANT
GLOVE BIO SURGEON STRL SZ7 (GLOVE) ×4 IMPLANT
GLOVE BIO SURGEON STRL SZ8 (GLOVE) ×1 IMPLANT
GLOVE BIOGEL PI IND STRL 7.0 (GLOVE) IMPLANT
GLOVE BIOGEL PI IND STRL 7.5 (GLOVE) ×1 IMPLANT
GLOVE BIOGEL PI IND STRL 8 (GLOVE) IMPLANT
GLOVE BIOGEL PI INDICATOR 7.0 (GLOVE) ×1
GLOVE BIOGEL PI INDICATOR 7.5 (GLOVE) ×1
GLOVE BIOGEL PI INDICATOR 8 (GLOVE) ×1
GOWN STRL REUS W/ TWL LRG LVL3 (GOWN DISPOSABLE) ×3 IMPLANT
GOWN STRL REUS W/TWL 2XL LVL3 (GOWN DISPOSABLE) ×1 IMPLANT
GOWN STRL REUS W/TWL LRG LVL3 (GOWN DISPOSABLE) ×6
HOOD PEEL AWAY FACE SHEILD DIS (HOOD) ×1 IMPLANT
KIT BASIN OR (CUSTOM PROCEDURE TRAY) ×2 IMPLANT
KIT ROOM TURNOVER OR (KITS) ×2 IMPLANT
NS IRRIG 1000ML POUR BTL (IV SOLUTION) ×2 IMPLANT
PAD ARMBOARD 7.5X6 YLW CONV (MISCELLANEOUS) ×4 IMPLANT
PENCIL BUTTON HOLSTER BLD 10FT (ELECTRODE) ×1 IMPLANT
POUCH SPECIMEN RETRIEVAL 10MM (ENDOMECHANICALS) ×2 IMPLANT
RELOAD STAPLE 45 3.5 BLU ETS (ENDOMECHANICALS) ×1 IMPLANT
RELOAD STAPLE TA45 3.5 REG BLU (ENDOMECHANICALS) ×2 IMPLANT
SCALPEL HARMONIC ACE (MISCELLANEOUS) ×2 IMPLANT
SET IRRIG TUBING LAPAROSCOPIC (IRRIGATION / IRRIGATOR) ×2 IMPLANT
SPECIMEN JAR SMALL (MISCELLANEOUS) ×2 IMPLANT
SPONGE GAUZE 2X2 STER 10/PKG (GAUZE/BANDAGES/DRESSINGS) ×1
SPONGE LAP 18X18 X RAY DECT (DISPOSABLE) ×1 IMPLANT
SUCTION POOLE TIP (SUCTIONS) ×1 IMPLANT
SUT MNCRL AB 4-0 PS2 18 (SUTURE) ×2 IMPLANT
SUT PDS AB 1 CT  36 (SUTURE) ×2
SUT PDS AB 1 CT 36 (SUTURE) IMPLANT
SUT SILK 3 0 SH CR/8 (SUTURE) ×1 IMPLANT
TOWEL OR 17X24 6PK STRL BLUE (TOWEL DISPOSABLE) ×2 IMPLANT
TOWEL OR 17X26 10 PK STRL BLUE (TOWEL DISPOSABLE) ×2 IMPLANT
TRAY FOLEY CATH 14FR (SET/KITS/TRAYS/PACK) ×1 IMPLANT
TRAY LAPAROSCOPIC (CUSTOM PROCEDURE TRAY) ×2 IMPLANT
TROCAR XCEL BLADELESS 5X75MML (TROCAR) ×4 IMPLANT
TROCAR XCEL BLUNT TIP 100MML (ENDOMECHANICALS) ×2 IMPLANT
TUBE CONNECTING 12X1/4 (SUCTIONS) ×1 IMPLANT
YANKAUER SUCT BULB TIP NO VENT (SUCTIONS) ×1 IMPLANT

## 2013-09-27 NOTE — ED Notes (Addendum)
Pt vomited PO contrast.  Dr Doy Mince notified.  Ct aware to go ahead with scan.

## 2013-09-27 NOTE — ED Notes (Signed)
Pt from home via [facility] with c/o LLQ pain, nausea, and vomiting starting at 1 am today.  Pt reports having the same around a year ago at which time she was told her appendix is on her left side and had a drainage bag for a short time following surgery.  Dr Hulen Skains told pt today to come to the ER.  Pt in NAD, A&O.

## 2013-09-27 NOTE — ED Notes (Signed)
MD at bedside. 

## 2013-09-27 NOTE — Transfer of Care (Signed)
Immediate Anesthesia Transfer of Care Note  Patient: Laura Best  Procedure(s) Performed: Procedure(s): APPENDECTOMY - CONVERTED FROM LAPAROSCOPIC (N/A)  Patient Location: PACU  Anesthesia Type:General  Level of Consciousness: awake, oriented, sedated, patient cooperative and responds to stimulation  Airway & Oxygen Therapy: Patient Spontanous Breathing and Patient connected to nasal cannula oxygen  Post-op Assessment: Report given to PACU RN, Post -op Vital signs reviewed and stable, Patient moving all extremities and Patient moving all extremities X 4  Post vital signs: Reviewed and stable  Complications: No apparent anesthesia complications

## 2013-09-27 NOTE — ED Provider Notes (Signed)
Medical screening examination/treatment/procedure(s) were conducted as a shared visit with non-physician practitioner(s) and myself.  I personally evaluated the patient during the encounter.   EKG Interpretation   Date/Time:  Saturday September 27 2013 19:56:10 EDT Ventricular Rate:  80 PR Interval:  254 QRS Duration: 126 QT Interval:  454 QTC Calculation: 524 R Axis:   -81 Text Interpretation:  Sinus rhythm Prolonged PR interval Nonspecific IVCD  with LAD LVH with secondary repolarization abnormality Anterior infarct,  old compared to prior, nonspecific ST/T changes improved.  QT improved.   Confirmed by [facility]  MD, TREY [id number]) on 09/27/2013 10:45:36 PM      73 yo female with LLQ abdominal pain since last night.  Some fevers, some nausea and vomiting, no changes to bowel movements.  On exam, well appearing, nontoxic, not distressed, normal respiratory effort, normal perfusion, abdomen soft but tender in LLQ and LUQ with some guarding.  Plan Labs and CT.    Admit for appendicitis  Clinical Impression: 1. Acute appendicitis with localized peritonitis       Houston Siren III, MD 09/27/13 2312

## 2013-09-27 NOTE — ED Notes (Signed)
Pts SpO2 ran 88% consistently over a 2-3 minute period.  Placed pt on 2L nasal cannula.

## 2013-09-27 NOTE — ED Provider Notes (Signed)
CSN: [id number]     Arrival date & time 09/27/13  1642 History   First MD Initiated Contact with Patient 09/27/13 1643     Chief Complaint  Patient presents with  . Abdominal Pain  . Emesis     (Consider location/radiation/quality/duration/timing/severity/associated sxs/prior Treatment) HPI Comments: Pt with h/o left-sided appendiceal abscess/perforated appendicitis treated with percutaneous drain in June 2014, no subsequent appendectomy -- presents with N/V and left-sided abdominal pain starting gradually yesterday. Pt called Dr. Johney Maine prior to ED evaluation. She has had fever to 101F documented by EMS. She has had nausea but no vomiting. + anorexia. No diarrhea, urinary symptoms. Pain feels similar to previous pain with abscess. Denies other surgery. Last oral intake was water 2 hours ago. She has not taken any medications today. The onset of this condition was acute. The course is constant. Aggravating factors: none. Alleviating factors: none.    Patient is a 73 y.o. female presenting with abdominal pain and vomiting. The history is provided by the patient and medical records.  Abdominal Pain Associated symptoms: fever, nausea and vomiting   Associated symptoms: no chest pain, no chills, no cough, no diarrhea, no dysuria, no hematuria and no sore throat   Emesis Associated symptoms: abdominal pain   Associated symptoms: no chills, no diarrhea, no headaches, no myalgias and no sore throat     Past Medical History  Diagnosis Date  . Diabetes mellitus   . Hypertension   . Hyperlipidemia   . Tricuspid regurgitation     Echo 05/02/06-Nml LV. Mild MR. Mod TR  . Osteoporosis   . Diastolic heart failure   . Vitamin D deficiency   . Osteoporosis   . CKD (chronic kidney disease) stage 3, GFR 30-59 ml/min 12/25/2012   Past Surgical History  Procedure Laterality Date  . Heart chamber revision      patched hole --1993  . Tubal ligation     Family History  Problem Relation Age of Onset   . Cancer Father   . Diabetes Father   . Cancer Brother     Brain  . Cancer Sister     abdominal fat   History  Substance Use Topics  . Smoking status: Former Smoker    Quit date: 01/19/2009  . Smokeless tobacco: Never Used  . Alcohol Use: No   OB History   Grav Para Term Preterm Abortions TAB SAB Ect Mult Living                 Review of Systems  Constitutional: Positive for fever. Negative for chills.  HENT: Negative for rhinorrhea and sore throat.   Eyes: Negative for redness.  Respiratory: Negative for cough.   Cardiovascular: Negative for chest pain.  Gastrointestinal: Positive for nausea, vomiting and abdominal pain. Negative for diarrhea.  Genitourinary: Negative for dysuria and hematuria.  Musculoskeletal: Negative for myalgias.  Skin: Negative for rash.  Neurological: Negative for headaches.      Allergies  Ace inhibitors; Aspirin; and Metformin and related  Home Medications   Prior to Admission medications   Medication Sig Start Date End Date Taking? Authorizing Provider  ACCU-CHEK AVIVA PLUS test strip  03/10/13   Historical Provider, MD  ACCU-CHEK SOFTCLIX LANCETS lancets  03/18/13   Historical Provider, MD  acetaminophen (TYLENOL) 500 MG tablet Take 500 mg by mouth every 6 (six) hours as needed for pain.    Historical Provider, MD  alendronate (FOSAMAX) 70 MG tablet Take 1 tablet (70 mg total) by mouth every  7 (seven) days. Take with a full glass of water on an empty stomach. 12/16/12   Lonie Peak Dixon, PA-C  amLODipine (NORVASC) 10 MG tablet Take 10 mg by mouth daily.    Historical Provider, MD  aspirin EC 81 MG EC tablet Take 1 tablet (81 mg total) by mouth daily. 01/24/13   Delfina Redwood, MD  cholecalciferol (VITAMIN D) 1000 UNITS tablet Take 4,000 Units by mouth daily.     Historical Provider, MD  cloNIDine (CATAPRES) 0.2 MG tablet Take 1 tablet (0.2 mg total) by mouth 2 (two) times daily. 02/14/13   Arnoldo Lenis, MD  furosemide (LASIX) 40 MG  tablet TAKE ONE TABLET BY MOUTH ONCE DAILY 06/12/13   Orlena Sheldon, PA-C  Insulin Detemir (LEVEMIR) 100 UNIT/ML Pen Inject 18 Units into the skin every morning. 02/19/13   Lonie Peak Dixon, PA-C  Insulin Pen Needle (PEN NEEDLES 31GX5/16") 31G X 8 MM MISC 1 each by Does not apply route daily. 02/19/13   Lonie Peak Dixon, PA-C  pioglitazone (ACTOS) 30 MG tablet TAKE ONE TABLET BY MOUTH ONCE DAILY 07/22/13   Orlena Sheldon, PA-C  simvastatin (ZOCOR) 20 MG tablet TAKE ONE TABLET BY MOUTH ONCE DAILY AT BEDTIME 07/22/13   Lonie Peak Dixon, PA-C   BP 164/51  Pulse 99  Temp(Src) 99.8 F (37.7 C) (Oral)  Resp 17  Ht 5\' 3"  (1.6 m)  Wt 165 lb (74.844 kg)  BMI 29.24 kg/m2  SpO2 96%  Physical Exam  Nursing note and vitals reviewed. Constitutional: She appears well-developed and well-nourished.  HENT:  Head: Normocephalic and atraumatic.  Eyes: Conjunctivae are normal. Right eye exhibits no discharge. Left eye exhibits no discharge.  Neck: Normal range of motion. Neck supple.  Cardiovascular: Normal rate, regular rhythm and normal heart sounds.   Pulmonary/Chest: Effort normal and breath sounds normal.  Abdominal: Soft. She exhibits no distension. Bowel sounds are decreased. There is tenderness (moderate to severe L sided abd pain) in the epigastric area, periumbilical area, left upper quadrant and left lower quadrant. There is guarding. There is no rebound and no CVA tenderness.  Neurological: She is alert.  Skin: Skin is warm and dry.  Psychiatric: She has a normal mood and affect.    ED Course  Procedures (including critical care time) Labs Review Labs Reviewed  CBC WITH DIFFERENTIAL - Abnormal; Notable for the following:    WBC 12.0 (*)    RBC 3.53 (*)    Hemoglobin 9.9 (*)    HCT 29.9 (*)    Neutrophils Relative % 88 (*)    Neutro Abs 10.6 (*)    Lymphocytes Relative 6 (*)    All other components within normal limits  COMPREHENSIVE METABOLIC PANEL - Abnormal; Notable for the following:    Sodium 136  (*)    Glucose, Bld 235 (*)    BUN 37 (*)    Creatinine, Ser 1.70 (*)    GFR calc non Af Amer 29 (*)    GFR calc Af Amer 33 (*)    All other components within normal limits  CULTURE, BLOOD (ROUTINE X 2)  CULTURE, BLOOD (ROUTINE X 2)  LIPASE, BLOOD  URINALYSIS, ROUTINE W REFLEX MICROSCOPIC  I-STAT CG4 LACTIC ACID, ED    Imaging Review Ct Abdomen Pelvis Wo Contrast  09/27/2013   CLINICAL DATA:  Left-sided abdominal pain with nausea and vomiting. History of prior periappendiceal abscess post drainage July 2014.  EXAM: CT ABDOMEN AND PELVIS WITHOUT CONTRAST  TECHNIQUE:  Multidetector CT imaging of the abdomen and pelvis was performed following the standard protocol without IV contrast.  COMPARISON:  12/31/2012, 08/01/2012 and 07/19/2012  FINDINGS: The lung bases demonstrate minimal atelectasis over the posterior left lower lobe. There is minimal focal calcification along the right side of the pericardium unchanged.  Abdominal images demonstrate a normal liver, spleen, pancreas, gallbladder and adrenal glands. Kidneys are normal in size without hydronephrosis or nephrolithiasis. There is moderate calcified plaque involving the abdominal aorta and iliac vessels.  The cecum is located just left of midline in the mid abdomen. There is a blind-ending tubular structure extending from the cecum measuring 1 cm diameter with adjacent inflammatory change likely representing in inflamed appendix. No evidence of perforation or abscess.  There is mild diverticulosis of the colon. Remaining pelvic structures are unchanged. Mild spondylosis of the spine.  IMPRESSION: Findings compatible with mild acute appendicitis. Note that the cecum is located in the mid abdomen just left of midline. No evidence of perforation or abscess.  Mild diverticulosis of the colon.  These results were called by telephone at the time of interpretation on 09/27/2013 at 7:33 pm to Dr. Doy Mince, who verbally acknowledged these results.    Electronically Signed   By: Marin Olp M.D.   On: 09/27/2013 19:33     EKG Interpretation None      4:48 PM Surgery note read. Previous CT reviewed.   Vital signs reviewed and are as follows: BP 164/51  Pulse 98  Temp(Src) 99.8 F (37.7 C) (Oral)  Resp 14  Ht 5\' 3"  (1.6 m)  Wt 165 lb (74.844 kg)  BMI 29.24 kg/m2  SpO2 96%  5:08 PM Pt seen. Orders placed. Patient has guarding on exam + fever. Given high suspicion for intra-abdominal infection -- zosyn ordered. CT pending. Patient discussed with Dr. Doy Mince who will see.     CT showed acute appendicitis. Pt informed> Surgery was paged and Dr. Gershon Crane evaluated patient and plans surgery. Patient continues to be NPO. On recheck she is sleepy but pain is tolerable. Exam is unchanged.   Pre-op CXR and EKG ordered.   MDM   Final diagnoses:  Acute appendicitis with localized peritonitis   Admit.    Carlisle Cater, PA-C 09/27/13 2130

## 2013-09-27 NOTE — Telephone Encounter (Signed)
AANAYA Best  September 21, 1940 [id number]  Patient Care Team: Orlena Sheldon, PA-C as PCP - General (Unknown Physician Specialty) Laura Frizzle, MD (Family Medicine)  This patient is a 73 y.o.female who calls today for surgical evaluation.   Date of procedure/visit: 2014  Surgery: Percutaneous drainage of appendiceal abscess  Reason for call: Returning pain  Patient found to have abscess near appendix June 2014 last summer.  Underwent percutaneous drainage.  Eventually improved.  Followed by Dr Hulen Skains with [facility].  Last seen December 2014.  No abnormality seen on followup CT scan.  Drain removed.  Decision made to hold off on interval appendectomy.    [facility] surgery has not seen her this year.  She calls today complaining of abdominal pain and nausea and vomiting.  Started last night.  Not getting better.  She is worried that maybe she has appendicitis again.  No sick contacts.  I recommend she consider getting evaluated.  Because it is the weekend, consider going to the emergency room to see if laboratory work and CT scan is needed to rule out recurrent appendicitis or other etiology for her abdominal pain.  She is considering this  Patient Active Problem List   Diagnosis Date Noted  . Bradycardia 04/02/2013  . Hyperkalemia 01/22/2013  . Acute renal failure 01/21/2013  . Anemia of chronic disease 01/21/2013  . Acute bronchitis 01/20/2013  . CHF exacerbation 01/19/2013  . Congestive heart disease 01/19/2013  . Acute diastolic CHF (congestive heart failure) 01/19/2013  . CKD (chronic kidney disease) stage 3, GFR 30-59 ml/min 12/25/2012  . Screening for colorectal cancer 12/25/2012  . Breast cancer screening 12/25/2012  . Vitamin D deficiency   . Osteoporosis   . Postop check 08/13/2012  . Appendiceal abscess 07/30/2012  . Fever, unspecified 07/19/2012  . Abdominal  pain, other specified site 07/19/2012  . SOB (shortness of breath) 07/19/2012  . ARF (acute renal failure)  07/19/2012  . Abscess, appendix 07/19/2012  . Acute diastolic heart failure [id number]  . Diabetes type 2, uncontrolled 02/02/2011  . Pulmonary hypertension 02/02/2011  . COPD exacerbation 02/02/2011  . Hypokalemia 02/02/2011  . Normocytic anemia 02/02/2011  . Hypertension   . Hyperlipidemia     Past Medical History  Diagnosis Date  . Diabetes mellitus   . Hypertension   . Hyperlipidemia   . Tricuspid regurgitation     Echo 05/02/06-Nml LV. Mild MR. Mod TR  . Osteoporosis   . Diastolic heart failure   . Vitamin D deficiency   . Osteoporosis   . CKD (chronic kidney disease) stage 3, GFR 30-59 ml/min 12/25/2012    Past Surgical History  Procedure Laterality Date  . Heart chamber revision      patched hole --1993  . Tubal ligation      History   Social History  . Marital Status: Widowed    Spouse Name: N/A    Number of Children: N/A  . Years of Education: N/A   Occupational History  . Not on file.   Social History Main Topics  . Smoking status: Former Smoker    Quit date: 01/19/2009  . Smokeless tobacco: Never Used  . Alcohol Use: No  . Drug Use: No  . Sexual Activity: Not on file   Other Topics Concern  . Not on file   Social History Narrative  . No narrative on file    Family History  Problem Relation Age of Onset  . Cancer Father   . Diabetes Father   .  Cancer Brother     Brain  . Cancer Sister     abdominal fat    Current Outpatient Prescriptions  Medication Sig Dispense Refill  . ACCU-CHEK AVIVA PLUS test strip       . ACCU-CHEK SOFTCLIX LANCETS lancets       . acetaminophen (TYLENOL) 500 MG tablet Take 500 mg by mouth every 6 (six) hours as needed for pain.      Marland Kitchen alendronate (FOSAMAX) 70 MG tablet Take 1 tablet (70 mg total) by mouth every 7 (seven) days. Take with a full glass of water on an empty stomach.  4 tablet  11  . amLODipine (NORVASC) 10 MG tablet Take 10 mg by mouth daily.      Marland Kitchen aspirin EC 81 MG EC tablet Take 1 tablet (81 mg  total) by mouth daily.      . cholecalciferol (VITAMIN D) 1000 UNITS tablet Take 4,000 Units by mouth daily.       . cloNIDine (CATAPRES) 0.2 MG tablet Take 1 tablet (0.2 mg total) by mouth 2 (two) times daily.  60 tablet  6  . furosemide (LASIX) 40 MG tablet TAKE ONE TABLET BY MOUTH ONCE DAILY  30 tablet  3  . Insulin Detemir (LEVEMIR) 100 UNIT/ML Pen Inject 18 Units into the skin every morning.      . Insulin Pen Needle (PEN NEEDLES 31GX5/16") 31G X 8 MM MISC 1 each by Does not apply route daily.  30 each  11  . pioglitazone (ACTOS) 30 MG tablet TAKE ONE TABLET BY MOUTH ONCE DAILY  90 tablet  0  . simvastatin (ZOCOR) 20 MG tablet TAKE ONE TABLET BY MOUTH ONCE DAILY AT BEDTIME  90 tablet  0   No current facility-administered medications for this visit.     Allergies  Allergen Reactions  . Ace Inhibitors Other (See Comments)    Hyperkalemia  . Aspirin Other (See Comments)    G.I. Upset.   . Metformin And Related Diarrhea    @VS @  No results found.  Note: This dictation was prepared with Dragon/digital dictation along with [employer]. Any transcriptional errors that result from this process are unintentional.

## 2013-09-27 NOTE — OR Nursing (Signed)
Case converted from laparoscopy to open emergent procedure. Count done on primary set of instruments for laparoscopy (this was correct), but not done on GI Set needed for open procedure. Surgeon proceeded with open procedure and ordered abdomen XRAY at end of case.

## 2013-09-27 NOTE — ED Notes (Signed)
Patient transported to CT 

## 2013-09-27 NOTE — ED Provider Notes (Signed)
Medical screening examination/treatment/procedure(s) were conducted as a shared visit with non-physician practitioner(s) and myself.  I personally evaluated the patient during the encounter.   EKG Interpretation   Date/Time:  Saturday September 27 2013 19:56:10 EDT Ventricular Rate:  80 PR Interval:  254 QRS Duration: 126 QT Interval:  454 QTC Calculation: 524 R Axis:   -81 Text Interpretation:  Sinus rhythm Prolonged PR interval Nonspecific IVCD  with LAD LVH with secondary repolarization abnormality Anterior infarct,  old compared to prior, nonspecific ST/T changes improved.  QT improved.   Confirmed by [facility]  MD, TREY [phone]) on 09/27/2013 10:45:36 PM        Houston Siren III, MD 09/27/13 2251

## 2013-09-27 NOTE — Op Note (Signed)
Laparoscopic appendectomy converted to open appendectomy  Indications: The patient presented with a history of left-sided abdominal pain. A CT scan revealed findings consistent with acute appendicitis, but her appendix is located in the left lower quadrant.  She has a previous history of perforated appendicitis with abscess that was managed with a percutaneous drain last year.  Pre-operative Diagnosis: Acute appendicitis with peritoneal abscess  Post-operative Diagnosis: Same  Surgeon: Miah Boye K.   Assistants: none  Anesthesia: General endotracheal anesthesia  ASA Class: 3  Procedure Details  The patient was seen again in the Holding Room. The risks, benefits, complications, treatment options, and expected outcomes were discussed with the patient and/or family. The possibilities of reaction to medication, perforation of viscus, bleeding, recurrent infection, finding a normal appendix, the need for additional procedures, failure to diagnose a condition, and creating a complication requiring transfusion or operation were discussed. There was concurrence with the proposed plan and informed consent was obtained. The site of surgery was properly noted. The patient was taken to Operating Room, identified as Laura Best and the procedure verified as Appendectomy. A Time Out was held and the above information confirmed.  The patient was placed in the supine position and general anesthesia was induced.  The abdomen was prepped and draped in a sterile fashion. A one centimeter supraumbilical incision was made.  Dissection was carried down to the fascia bluntly.  The fascia was incised vertically.  We entered the peritoneal cavity bluntly.  A pursestring suture was passed around the incision with a 0 Vicryl.  The Hasson cannula was introduced into the abdomen and the tails of the suture were used to hold the Hasson in place.   The pneumoperitoneum was then established maintaining a maximum pressure  of 15 mmHg.  Additional 5 mm cannulas then placed in the right lower quadrant of the abdomen and the left upper quadrant under direct visualization. A careful evaluation of the entire abdomen was carried out. The patient was placed in Trendelenburg and right lateral decubitus position.  The scope was moved to the right upper quadrant port site. The cecum was identified in the left lower quadrant just lateral to the umbilicus. There is a lot of inflammation this area with adherent omentum. I began peeling the omentum away from the cecum and there seemed to be a lot of purulent fluid in this area. I could not easily identify the appendix. The surrounding small bowel seemed densely adherent to the cecum.  I made the decision to convert to an open procedure through a small vertical periumbilical incision.  The incision was made to include the initial umbilical incision. Dissection was carried down to the fascia which was divided vertically. The into the peritoneal cavity. A Balfour retractor was placed. A fetal identify the cecum. Using blunt dissection I was able to dissect the omentum and the surrounding small bowel away. The appendix is fairly inflamed with some purulence and some fibrinous exudate. This seems to be a perforation right at the base of the appendix where it enters the cecum. The appendix was removed with the GIA stapler. We then closed the defect in the cecum with multiple interrupted 3-0 silk sutures. The terminal ileum seems to be intact with some secondary inflammation. We placed some omentum over our appendiceal closure. We irrigated the abdomen thoroughly with warm saline. The fascia was reapproximated with #1 PDS suture. The subcutaneous tissues were irrigated and a small VAC sponge was cut to fit the wound. The 2 port sites  were closed with 4-0 Monocryl and dressed with Steri-Strips and Tegaderm. The patient was then extubated and brought to the recovery room in stable condition. All sponge,  instrument, and needle counts are correct.   Findings: The appendix was found to be inflamed. There were signs of necrosis.  There was perforation. There was abscess formation.  Estimated Blood Loss:  less than 100 mL         Drains: none         Specimens: appendix         Complications:  None; patient tolerated the procedure well.         Disposition: PACU - hemodynamically stable.         Condition: stable  Laura Best. Laura Dover, MD, [facility] Surgery  General/ Trauma Surgery  09/27/2013 11:46 PM

## 2013-09-27 NOTE — H&P (Signed)
Laura Best is an 73 y.o. female.   Chief Complaint: Left lower quadrant abdominal pain, nausea vomiting HPI: This is a 73 yo female who presented in 2014 with perforated appendicitis with abscess, managed with percutaneous drain by Dr. Doreen Salvage.  The drain was removed in December.  No attempt was made at interval appendectomy.  She presents with less than 24 hours of acute left lower quadrant abdominal pain, nausea, vomiting.  No fever.  CT scan shows left-sided appendicitis without sign of perforation.   Past Medical History  Diagnosis Date  . Diabetes mellitus   . Hypertension   . Hyperlipidemia   . Tricuspid regurgitation     Echo 05/02/06-Nml LV. Mild MR. Mod TR  . Osteoporosis   . Diastolic heart failure   . Vitamin D deficiency   . Osteoporosis   . CKD (chronic kidney disease) stage 3, GFR 30-59 ml/min 12/25/2012    Past Surgical History  Procedure Laterality Date  . Heart chamber revision      patched hole --1993  . Tubal ligation      Family History  Problem Relation Age of Onset  . Cancer Father   . Diabetes Father   . Cancer Brother     Brain  . Cancer Sister     abdominal fat   Social History:  reports that she quit smoking about 4 years ago. She has never used smokeless tobacco. She reports that she does not drink alcohol or use illicit drugs.  Allergies:  Allergies  Allergen Reactions  . Ace Inhibitors Other (See Comments)    Hyperkalemia  . Aspirin Other (See Comments)    G.I. Upset.   . Metformin And Related Diarrhea    Prior to Admission medications   Medication Sig Start Date End Date Taking? Authorizing Provider  acetaminophen (TYLENOL) 500 MG tablet Take 500 mg by mouth every 6 (six) hours as needed for pain.   Yes Historical Provider, MD  alendronate (FOSAMAX) 70 MG tablet Take 70 mg by mouth once a week. Take with a full glass of water on an empty stomach.   Yes Historical Provider, MD  amLODipine (NORVASC) 10 MG tablet Take 10 mg by mouth  daily.   Yes Historical Provider, MD  aspirin EC 81 MG tablet Take 81 mg by mouth daily.   Yes Historical Provider, MD  cholecalciferol (VITAMIN D) 1000 UNITS tablet Take 4,000 Units by mouth daily.    Yes Historical Provider, MD  cloNIDine (CATAPRES) 0.2 MG tablet Take 0.2 mg by mouth 2 (two) times daily.   Yes Historical Provider, MD  furosemide (LASIX) 40 MG tablet Take 40 mg by mouth daily.   Yes Historical Provider, MD  Insulin Detemir (LEVEMIR) 100 UNIT/ML Pen Inject 18 Units into the skin every morning. 02/19/13  Yes Mary B Dixon, PA-C  pioglitazone (ACTOS) 30 MG tablet Take 30 mg by mouth daily.   Yes Historical Provider, MD  simvastatin (ZOCOR) 20 MG tablet Take 20 mg by mouth at bedtime.   Yes Historical Provider, MD    Results for orders placed during the hospital encounter of 09/27/13 (from the past 48 hour(s))  CBC WITH DIFFERENTIAL     Status: Abnormal   Collection Time    09/27/13  5:33 PM      Result Value Ref Range   WBC 12.0 (*) 4.0 - 10.5 K/uL   RBC 3.53 (*) 3.87 - 5.11 MIL/uL   Hemoglobin 9.9 (*) 12.0 - 15.0 g/dL   HCT  29.9 (*) 36.0 - 46.0 %   MCV 84.7  78.0 - 100.0 fL   MCH 28.0  26.0 - 34.0 pg   MCHC 33.1  30.0 - 36.0 g/dL   RDW 15.3  11.5 - 15.5 %   Platelets 188  150 - 400 K/uL   Neutrophils Relative % 88 (*) 43 - 77 %   Neutro Abs 10.6 (*) 1.7 - 7.7 K/uL   Lymphocytes Relative 6 (*) 12 - 46 %   Lymphs Abs 0.7  0.7 - 4.0 K/uL   Monocytes Relative 6  3 - 12 %   Monocytes Absolute 0.7  0.1 - 1.0 K/uL   Eosinophils Relative 0  0 - 5 %   Eosinophils Absolute 0.0  0.0 - 0.7 K/uL   Basophils Relative 0  0 - 1 %   Basophils Absolute 0.0  0.0 - 0.1 K/uL  COMPREHENSIVE METABOLIC PANEL     Status: Abnormal   Collection Time    09/27/13  5:33 PM      Result Value Ref Range   Sodium 136 (*) 137 - 147 mEq/L   Potassium 3.8  3.7 - 5.3 mEq/L   Chloride 99  96 - 112 mEq/L   CO2 23  19 - 32 mEq/L   Glucose, Bld 235 (*) 70 - 99 mg/dL   BUN 37 (*) 6 - 23 mg/dL    Creatinine, Ser 1.70 (*) 0.50 - 1.10 mg/dL   Calcium 9.0  8.4 - 10.5 mg/dL   Total Protein 7.2  6.0 - 8.3 g/dL   Albumin 3.5  3.5 - 5.2 g/dL   AST 13  0 - 37 U/L   ALT 10  0 - 35 U/L   Alkaline Phosphatase 58  39 - 117 U/L   Total Bilirubin 0.4  0.3 - 1.2 mg/dL   GFR calc non Af Amer 29 (*) >90 mL/min   GFR calc Af Amer 33 (*) >90 mL/min   Comment: (NOTE)     The eGFR has been calculated using the CKD EPI equation.     This calculation has not been validated in all clinical situations.     eGFR's persistently <90 mL/min signify possible Chronic Kidney     Disease.   Anion gap 14  5 - 15  LIPASE, BLOOD     Status: None   Collection Time    09/27/13  5:33 PM      Result Value Ref Range   Lipase 16  11 - 59 U/L  I-STAT CG4 LACTIC ACID, ED     Status: None   Collection Time    09/27/13  5:46 PM      Result Value Ref Range   Lactic Acid, Venous 0.78  0.5 - 2.2 mmol/L   Ct Abdomen Pelvis Wo Contrast  09/27/2013   CLINICAL DATA:  Left-sided abdominal pain with nausea and vomiting. History of prior periappendiceal abscess post drainage July 2014.  EXAM: CT ABDOMEN AND PELVIS WITHOUT CONTRAST  TECHNIQUE: Multidetector CT imaging of the abdomen and pelvis was performed following the standard protocol without IV contrast.  COMPARISON:  12/31/2012, 08/01/2012 and 07/19/2012  FINDINGS: The lung bases demonstrate minimal atelectasis over the posterior left lower lobe. There is minimal focal calcification along the right side of the pericardium unchanged.  Abdominal images demonstrate a normal liver, spleen, pancreas, gallbladder and adrenal glands. Kidneys are normal in size without hydronephrosis or nephrolithiasis. There is moderate calcified plaque involving the abdominal aorta and iliac vessels.  The cecum is located just left of midline in the mid abdomen. There is a blind-ending tubular structure extending from the cecum measuring 1 cm diameter with adjacent inflammatory change likely representing  in inflamed appendix. No evidence of perforation or abscess.  There is mild diverticulosis of the colon. Remaining pelvic structures are unchanged. Mild spondylosis of the spine.  IMPRESSION: Findings compatible with mild acute appendicitis. Note that the cecum is located in the mid abdomen just left of midline. No evidence of perforation or abscess.  Mild diverticulosis of the colon.  These results were called by telephone at the time of interpretation on 09/27/2013 at 7:33 pm to Dr. Doy Mince, who verbally acknowledged these results.   Electronically Signed   By: Marin Olp M.D.   On: 09/27/2013 19:33    Review of Systems  Constitutional: Negative for weight loss.  HENT: Negative for ear discharge, ear pain, hearing loss and tinnitus.   Eyes: Negative.  Negative for blurred vision, double vision, photophobia and pain.  Respiratory: Negative for cough, sputum production and shortness of breath.   Cardiovascular: Negative for chest pain.  Gastrointestinal: Positive for nausea, vomiting and abdominal pain.  Genitourinary: Negative for dysuria, urgency, frequency and flank pain.  Musculoskeletal: Negative for back pain, falls, joint pain, myalgias and neck pain.  Neurological: Negative.  Negative for dizziness, tingling, sensory change, focal weakness, loss of consciousness and headaches.  Endo/Heme/Allergies: Negative.  Does not bruise/bleed easily.  Psychiatric/Behavioral: Negative for depression, memory loss and substance abuse. The patient is not nervous/anxious.     Blood pressure 151/50, pulse 81, temperature 100.1 F (37.8 C), temperature source Rectal, resp. rate 25, height $RemoveBe'5\' 3"'LiwDUwgih$  (1.6 m), weight 165 lb (74.844 kg), SpO2 97.00%. Physical Exam  WDWN in NAD HEENT:  EOMI, sclera anicteric Neck:  No masses, no thyromegaly Lungs:  CTA bilaterally; normal respiratory effort CV:  Regular rate and rhythm; no murmurs Abd:  +bowel sounds, obese, tender in left periumbilical area, extending into  LLQ Skin:  Warm, dry; no sign of jaundice  Assessment/Plan Acute appendicitis with no sign of perforation Appendix is located in LLQ History of previous perforated appendix with abscess - percutaneous drainage  Multiple medical comorbidities  Plan laparoscopic, possible open appendectomy tonight.  We will adjust our laparoscopic approach for her unusual anatomy. Consult [facility] to help manage her multiple medical issues.  Cyrah Mclamb K. 09/27/2013, 8:11 PM

## 2013-09-27 NOTE — OR Nursing (Signed)
2356 - Report from Dr. Gerilyn Nestle in radiology - No radiopaque, foreign bodies noted.

## 2013-09-27 NOTE — Anesthesia Procedure Notes (Signed)
Procedure Name: Intubation Date/Time: 09/27/2013 9:51 PM Performed by: Jacquiline Doe A Pre-anesthesia Checklist: Patient identified, Timeout performed, Emergency Drugs available, Suction available and Patient being monitored Patient Re-evaluated:Patient Re-evaluated prior to inductionOxygen Delivery Method: Circle system utilized Preoxygenation: Pre-oxygenation with 100% oxygen Intubation Type: IV induction, Rapid sequence and Cricoid Pressure applied Laryngoscope Size: Mac and 4 Grade View: Grade I Tube type: Oral Tube size: 7.0 mm Number of attempts: 1 Airway Equipment and Method: Stylet Placement Confirmation: ETT inserted through vocal cords under direct vision,  breath sounds checked- equal and bilateral and positive ETCO2 Secured at: 22 cm Tube secured with: Tape Dental Injury: Teeth and Oropharynx as per pre-operative assessment

## 2013-09-27 NOTE — Anesthesia Preprocedure Evaluation (Addendum)
Anesthesia Evaluation  Patient identified by MRN, date of birth, ID band Patient awake    Reviewed: Allergy & Precautions, H&P , NPO status , Patient's Chart, lab work & pertinent test results  History of Anesthesia Complications Negative for: history of anesthetic complications  Airway Mallampati: III TM Distance: >3 FB Neck ROM: Full    Dental  (+) Loose, Missing, Poor Dentition,    Pulmonary shortness of breath, neg sleep apnea, COPDformer smoker,  breath sounds clear to auscultation        Cardiovascular hypertension, Pt. on medications +CHF + Valvular Problems/Murmurs MR Rhythm:Regular  MR, TR, nl EF, diastolic dysfunction, pulm HTN   Neuro/Psych negative neurological ROS  negative psych ROS   GI/Hepatic Neg liver ROS, appendicitis   Endo/Other  diabetes, Type 2, Oral Hypoglycemic Agents, Insulin Dependent  Renal/GU CRF and Renal InsufficiencyRenal disease     Musculoskeletal   Abdominal   Peds  Hematology  (+) Blood dyscrasia, anemia ,   Anesthesia Other Findings   Reproductive/Obstetrics                          Anesthesia Physical Anesthesia Plan  ASA: III and emergent  Anesthesia Plan: General   Post-op Pain Management:    Induction: Intravenous, Rapid sequence and Cricoid pressure planned  Airway Management Planned: Oral ETT  Additional Equipment: None  Intra-op Plan:   Post-operative Plan: Extubation in OR and Possible Post-op intubation/ventilation  Informed Consent: I have reviewed the patients History and Physical, chart, labs and discussed the procedure including the risks, benefits and alternatives for the proposed anesthesia with the patient or authorized representative who has indicated his/her understanding and acceptance.   Dental advisory given  Plan Discussed with: CRNA and Surgeon  Anesthesia Plan Comments:         Anesthesia Quick Evaluation

## 2013-09-28 DIAGNOSIS — I5030 Unspecified diastolic (congestive) heart failure: Secondary | ICD-10-CM

## 2013-09-28 DIAGNOSIS — I10 Essential (primary) hypertension: Secondary | ICD-10-CM

## 2013-09-28 DIAGNOSIS — I509 Heart failure, unspecified: Secondary | ICD-10-CM

## 2013-09-28 DIAGNOSIS — E785 Hyperlipidemia, unspecified: Secondary | ICD-10-CM

## 2013-09-28 LAB — BASIC METABOLIC PANEL
ANION GAP: 11 (ref 5–15)
BUN: 32 mg/dL — ABNORMAL HIGH (ref 6–23)
CALCIUM: 8.2 mg/dL — AB (ref 8.4–10.5)
CO2: 24 mEq/L (ref 19–32)
Chloride: 105 mEq/L (ref 96–112)
Creatinine, Ser: 1.63 mg/dL — ABNORMAL HIGH (ref 0.50–1.10)
GFR calc Af Amer: 35 mL/min — ABNORMAL LOW (ref >90)
GFR calc non Af Amer: 30 mL/min — ABNORMAL LOW (ref >90)
Glucose, Bld: 276 mg/dL — ABNORMAL HIGH (ref 70–99)
POTASSIUM: 4.1 meq/L (ref 3.7–5.3)
Sodium: 140 mEq/L (ref 137–147)

## 2013-09-28 LAB — CBC
HCT: 30.7 % — ABNORMAL LOW (ref 36.0–46.0)
Hemoglobin: 10 g/dL — ABNORMAL LOW (ref 12.0–15.0)
MCH: 28.3 pg (ref 26.0–34.0)
MCHC: 32.6 g/dL (ref 30.0–36.0)
MCV: 87 fL (ref 78.0–100.0)
PLATELETS: 223 10*3/uL (ref 150–400)
RBC: 3.53 MIL/uL — ABNORMAL LOW (ref 3.87–5.11)
RDW: 15.5 % (ref 11.5–15.5)
WBC: 15.2 10*3/uL — ABNORMAL HIGH (ref 4.0–10.5)

## 2013-09-28 LAB — GLUCOSE, CAPILLARY
GLUCOSE-CAPILLARY: 206 mg/dL — AB (ref 70–99)
Glucose-Capillary: 268 mg/dL — ABNORMAL HIGH (ref 70–99)
Glucose-Capillary: 251 mg/dL — ABNORMAL HIGH (ref 70–99)
Glucose-Capillary: 134 mg/dL — ABNORMAL HIGH (ref 70–99)
Glucose-Capillary: 199 mg/dL — ABNORMAL HIGH (ref 70–99)
Glucose-Capillary: 235 mg/dL — ABNORMAL HIGH (ref 70–99)

## 2013-09-28 LAB — HEMOGLOBIN A1C
Hgb A1c MFr Bld: 7.6 % — ABNORMAL HIGH (ref <5.7)
MEAN PLASMA GLUCOSE: 171 mg/dL — AB (ref <117)

## 2013-09-28 LAB — MRSA PCR SCREENING: MRSA by PCR: NEGATIVE

## 2013-09-28 MED ORDER — METRONIDAZOLE IN NACL 5-0.79 MG/ML-% IV SOLN
500.0000 mg | Freq: Three times a day (TID) | INTRAVENOUS | Status: DC
Start: 2013-09-28 — End: 2013-10-02
  Administered 2013-09-28 – 2013-10-02 (×14): 500 mg via INTRAVENOUS
  Filled 2013-09-28 (×17): qty 100

## 2013-09-28 MED ORDER — INSULIN ASPART 100 UNIT/ML ~~LOC~~ SOLN
SUBCUTANEOUS | Status: AC
  Filled 2013-09-28: qty 5

## 2013-09-28 MED ORDER — CLONIDINE HCL 0.2 MG PO TABS
0.2000 mg | ORAL_TABLET | Freq: Three times a day (TID) | ORAL | Status: DC
  Administered 2013-09-28 – 2013-10-03 (×14): 0.2 mg via ORAL
  Filled 2013-09-28 (×20): qty 1

## 2013-09-28 MED ORDER — INSULIN ASPART 100 UNIT/ML ~~LOC~~ SOLN
4.0000 [IU] | Freq: Three times a day (TID) | SUBCUTANEOUS | Status: DC
  Administered 2013-09-28: 4 [IU] via SUBCUTANEOUS

## 2013-09-28 MED ORDER — CLONIDINE HCL 0.2 MG PO TABS
0.2000 mg | ORAL_TABLET | Freq: Two times a day (BID) | ORAL | Status: DC
  Administered 2013-09-28: 0.2 mg via ORAL
  Filled 2013-09-28 (×3): qty 1

## 2013-09-28 MED ORDER — FUROSEMIDE 40 MG PO TABS
40.0000 mg | ORAL_TABLET | Freq: Every day | ORAL | Status: DC
  Administered 2013-09-28: 40 mg via ORAL
  Filled 2013-09-28: qty 1

## 2013-09-28 MED ORDER — HYDROMORPHONE HCL PF 1 MG/ML IJ SOLN
INTRAMUSCULAR | Status: AC
  Filled 2013-09-28: qty 1

## 2013-09-28 MED ORDER — ONDANSETRON HCL 4 MG PO TABS: 4.0000 mg | ORAL_TABLET | Freq: Four times a day (QID) | ORAL | Status: DC | PRN

## 2013-09-28 MED ORDER — INSULIN ASPART 100 UNIT/ML ~~LOC~~ SOLN
0.0000 [IU] | Freq: Three times a day (TID) | SUBCUTANEOUS | Status: DC
  Administered 2013-09-28: 8 [IU] via SUBCUTANEOUS
  Administered 2013-09-28: 5 [IU] via SUBCUTANEOUS
  Administered 2013-09-28: 2 [IU] via SUBCUTANEOUS
  Administered 2013-09-28: 5 [IU] via SUBCUTANEOUS
  Administered 2013-09-29 (×2): 2 [IU] via SUBCUTANEOUS
  Administered 2013-09-29: 5 [IU] via SUBCUTANEOUS
  Administered 2013-09-30 (×2): 3 [IU] via SUBCUTANEOUS
  Administered 2013-09-30: 5 [IU] via SUBCUTANEOUS
  Administered 2013-10-01: 3 [IU] via SUBCUTANEOUS
  Administered 2013-10-01 – 2013-10-02 (×2): 2 [IU] via SUBCUTANEOUS

## 2013-09-28 MED ORDER — MORPHINE SULFATE 2 MG/ML IJ SOLN
2.0000 mg | INTRAMUSCULAR | Status: DC | PRN
  Administered 2013-09-28 (×2): 4 mg via INTRAVENOUS
  Administered 2013-09-28 – 2013-09-29 (×3): 2 mg via INTRAVENOUS
  Filled 2013-09-28 (×2): qty 2
  Filled 2013-09-28 (×3): qty 1

## 2013-09-28 MED ORDER — INSULIN DETEMIR 100 UNIT/ML ~~LOC~~ SOLN
10.0000 [IU] | Freq: Every day | SUBCUTANEOUS | Status: DC
  Administered 2013-09-29: 10 [IU] via SUBCUTANEOUS
  Filled 2013-09-28: qty 0.1

## 2013-09-28 MED ORDER — ONDANSETRON HCL 4 MG/2ML IJ SOLN
4.0000 mg | Freq: Four times a day (QID) | INTRAMUSCULAR | Status: DC | PRN
  Administered 2013-09-28 – 2013-10-03 (×4): 4 mg via INTRAVENOUS
  Filled 2013-09-28 (×3): qty 2

## 2013-09-28 MED ORDER — DEXTROSE 5 % IV SOLN
1.0000 g | Freq: Every day | INTRAVENOUS | Status: DC
  Administered 2013-09-28 – 2013-10-01 (×5): 1 g via INTRAVENOUS
  Filled 2013-09-28 (×7): qty 10

## 2013-09-28 MED ORDER — AMLODIPINE BESYLATE 10 MG PO TABS
10.0000 mg | ORAL_TABLET | Freq: Every day | ORAL | Status: DC
  Administered 2013-09-28 – 2013-10-03 (×6): 10 mg via ORAL
  Filled 2013-09-28 (×6): qty 1

## 2013-09-28 MED ORDER — ENOXAPARIN SODIUM 30 MG/0.3ML ~~LOC~~ SOLN
30.0000 mg | Freq: Every day | SUBCUTANEOUS | Status: DC
  Administered 2013-09-28 – 2013-10-03 (×6): 30 mg via SUBCUTANEOUS
  Filled 2013-09-28 (×6): qty 0.3

## 2013-09-28 NOTE — Progress Notes (Signed)
Utilization Review Completed.   Daxten Kovalenko, RN, BSN Nurse Case Manager  

## 2013-09-28 NOTE — Consult Note (Signed)
[facility] TEAM 1 - Stepdown/ICU TEAM Consult F/U Note  Laura Best [id number] DOB: Aug 04, 1940 DOA: 09/27/2013 PCP: Dena Billet BETH, PA-C  Admit HPI / Brief Narrative: 73 yo F with a history of HTN, Diastolic CHF, DM2, Hyperlipidemia, and Stage III CKD who presented to the ED with complaints of LLQ ABD Pain with nausea/vomiting and was found on CT scan to have appendicitis with peritonitis on CT scan.  Dr. [name] took her to the OR for an appendectomy. [facility] were consulted for medical management.   HPI/Subjective: Pt is in good spirits and is resting comfortably.  Her only complaint is of being hungry.    Assessment/Plan:  Acute appendicitis with peritoneal abscess Per primary service (Gen Surgery)  DM CBG poorly controlled - adjust tx and follow - Intolerant to glucophage   HTN BP poorly controlled - likely element of pain - adjust tx and follow - negative renal artery [place] in past   HLD Resume Zocor once full diet resumed and well tolerated   Tricuspid regurg Trivial per TTE Dec 123456  Chronic diastolic CHF TTE Dec 123456 w/ EF 55% and grade 2 DD - euvolemic at present   Mild acute kidney injury on CKD stage III (GFR 30-59 ml/min) Follow   Vitamin D deficiency  Pulmonary hypertension / Mild R heart failure  PA pressure via TTE Dec 2014 43mm Hg - avoid hypovolemia   COPD Quit smoking 4 years ago  Code Status: FULL Family Communication: no family present at time of exam  DVT prophylaxis: lovenox  Objective: Blood pressure 164/65, pulse 90, temperature 98.4 F (36.9 C), temperature source Oral, resp. rate 15, height 5\' 3"  (1.6 m), weight 74.3 kg (163 lb 12.8 oz), SpO2 98.00%.  Intake/Output Summary (Last 24 hours) at 09/28/13 1006 Last data filed at 09/28/13 0900  Gross per 24 hour  Intake   2300 ml  Output    650 ml  Net   1650 ml   Exam: General: No acute respiratory distress Lungs: Clear to auscultation bilaterally without wheezes or  crackles Cardiovascular: Regular rate and rhythm without murmur gallop or rub normal S1 and S2 Abdomen: soft, bs +, midline wound vac in place, non distended, no rebound  Extremities: No significant cyanosis, clubbing, or edema bilateral lower extremities  Data Reviewed: Basic Metabolic Panel:  Recent Labs Lab 09/27/13 1733 09/28/13 0235  NA 136* 140  K 3.8 4.1  CL 99 105  CO2 23 24  GLUCOSE 235* 276*  BUN 37* 32*  CREATININE 1.70* 1.63*  CALCIUM 9.0 8.2*    Liver Function Tests:  Recent Labs Lab 09/27/13 1733  AST 13  ALT 10  ALKPHOS 58  BILITOT 0.4  PROT 7.2  ALBUMIN 3.5    Recent Labs Lab 09/27/13 1733  LIPASE 16   CBC:  Recent Labs Lab 09/27/13 1733 09/28/13 0235  WBC 12.0* 15.2*  NEUTROABS 10.6*  --   HGB 9.9* 10.0*  HCT 29.9* 30.7*  MCV 84.7 87.0  PLT 188 223    CBG:  Recent Labs Lab 09/27/13 2338 09/28/13 0444  GLUCAP 205* 268*    Recent Results (from the past 240 hour(s))  MRSA PCR SCREENING     Status: None   Collection Time    09/28/13  1:32 AM      Result Value Ref Range Status   MRSA by PCR NEGATIVE  NEGATIVE Final   Comment:            The GeneXpert MRSA  Assay (FDA     approved for NASAL specimens     only), is one component of a     comprehensive MRSA colonization     surveillance program. It is not     intended to diagnose MRSA     infection nor to guide or     monitor treatment for     MRSA infections.     Studies:  Recent x-ray studies have been reviewed in detail by the Attending Physician  Scheduled Meds:  Scheduled Meds: . amLODipine  10 mg Oral Daily  . cefTRIAXone (ROCEPHIN)  IV  1 g Intravenous QHS  . cloNIDine  0.2 mg Oral BID  . enoxaparin (LOVENOX) injection  30 mg Subcutaneous Daily  . furosemide  40 mg Oral Daily  . insulin aspart      . insulin aspart  0-15 Units Subcutaneous TID WC  . insulin aspart  4 Units Subcutaneous TID WC  . metronidazole  500 mg Intravenous Q8H    Time spent on care  of this patient: 35 mins   MCCLUNG,JEFFREY T , MD   [facility] Office  [phone] Pager - Text Page per Shea Evans as per below:  On-Call/Text Page:      [email or web address]      password [email or web address]  If 7PM-7AM, please contact night-coverage [email or web address] Password [email or web address] 09/28/2013, 10:06 AM   LOS: 1 day

## 2013-09-28 NOTE — Anesthesia Postprocedure Evaluation (Signed)
  Anesthesia Post-op Note  Patient: Laura Best  Procedure(s) Performed: Procedure(s): APPENDECTOMY - CONVERTED FROM LAPAROSCOPIC (N/A)  Patient Location: PACU  Anesthesia Type:General  Level of Consciousness: awake  Airway and Oxygen Therapy: Patient Spontanous Breathing  Post-op Pain: mild  Post-op Assessment: Post-op Vital signs reviewed, Patient's Cardiovascular Status Stable, Respiratory Function Stable, Patent Airway, No signs of Nausea or vomiting and Pain level controlled  Post-op Vital Signs: Reviewed and stable  Last Vitals:  Filed Vitals:   09/28/13 0700  BP: 164/65  Pulse: 90  Temp:   Resp: 15    Complications: No apparent anesthesia complications

## 2013-09-28 NOTE — Consult Note (Signed)
[facility] Consultation   Laura Best [id number] DOB: 1940/04/17 DOA: 09/27/2013 PCP: [name], PA-C     Requesting physician: Dr. Donnie Mesa Date of consultation: 09/27/2013 Reason for consultation: Medical Management    Impression/Recommendations Active Problems:     1.   Acute appendicitis with peritoneal abscess   S/P Appendectomy 09/27/2013   Oral Flagyl TID, IV Rocephin, and had IV Zosyn Pre-Op x1   Pain control with IV Morphine 2-4 mg every 2 hrs PRN   Clear Liquid Diet to advance as tolerated     2.   Diastolic CHF   Monitor for Signs and symptoms of Fluid Overload   Resume Lasix Rx     3.   Diabetes Mellitus Type II    SSI coverage q 4 Hrs   Hold oral Actos Rx for now.      Check HbA1c   4.   Essential Hypertension   Monitor BPs   Resume Amlodipine Rx, Hold if SBP < 110    5.   Hyperlipidemia   Resume Simvastatin Rx   6.  CKD Stage III   Monitor BUN/Cr  7.  DVT Prophylaxis    SCDs          The [facility] Team will continue to follow this patient with you, and will see the patient daily.  Thank you for this consultation.  Please contact the Rounding MD for the Assigned [facility] team         with any questions during the daytime hours of 7am-7pm, and the Night Floor Coverage for questions from 7 pm until 7 am.           HPI: Laura Best is a 73 y.o. female with a history of HTN, Diastolic CHF, DM2, Hyperlipidemia, Stage III CKD, who presented to the ED with complaints of LLQ ABD Pain and nausea and Vomiting and was found on CT scan to have Appendicitis with peritonitis on CT scan.   General Surgery was consulted Dr. Prince Solian evaluated the patient and took her to the OR for an appendectomy.   [facility] were consulted for medical management.    Post operatively, she was examined and  Found to be hemodynamically stable with report of adequate pain control.      Review of Systems:  Constitutional: No  Weight Loss, No Weight Gain, Night Sweats, Fevers, Chills, Dizziness, Fatigue, or Generalized Weakness HEENT: No Headaches, Difficulty Swallowing,Tooth/Dental Problems,Sore Throat,  No Sneezing, Rhinitis, Ear Ache, Nasal Congestion, or Post Nasal Drip,  Cardio-vascular:  No Chest pain, Orthopnea, PND, Edema in Lower Extremities, Anasarca, Dizziness, Palpitations  Resp: No Dyspnea, No DOE, No Productive Cough, No Non-Productive Cough, No Hemoptysis, No Wheezing.    GI: No Heartburn, Indigestion, +Abdominal Pain, +Nausea, +Vomiting, Diarrhea, Hematemesis, Hematochezia, Melena, Change in Bowel Habits,  Loss of Appetite  GU: No Dysuria, Change in Color of Urine, No Urgency or Frequency, No Flank pain.  Musculoskeletal: No Joint Pain or Swelling, No Decreased Range of Motion, No Back Pain.  Neurologic: No Syncope, No Seizures, Muscle Weakness, Paresthesia, Vision Disturbance or Loss, No Diplopia, No Vertigo, No Difficulty Walking,  Skin: No Rash or Lesions. Psych: No Change in Mood or Affect, No Depression or Anxiety, No Memory loss, No Confusion, or Hallucinations   Past Medical History  Diagnosis Date  . Diabetes mellitus   . Hypertension   . Hyperlipidemia   . Tricuspid regurgitation     Echo 05/02/06-Nml LV. Mild MR. Mod  TR  . Osteoporosis   . Diastolic heart failure   . Vitamin D deficiency   . Osteoporosis   . CKD (chronic kidney disease) stage 3, GFR 30-59 ml/min 12/25/2012    Past Surgical History  Procedure Laterality Date  . Heart chamber revision      patched hole --1993  . Tubal ligation       Prior to Admission medications   Medication Sig Start Date End Date Taking? Authorizing Provider  acetaminophen (TYLENOL) 500 MG tablet Take 500 mg by mouth every 6 (six) hours as needed for pain.   Yes Historical Provider, MD  alendronate (FOSAMAX) 70 MG tablet Take 70 mg by mouth once a week. Take with a full glass of water on an empty stomach.   Yes Historical Provider, MD    amLODipine (NORVASC) 10 MG tablet Take 10 mg by mouth daily.   Yes Historical Provider, MD  aspirin EC 81 MG tablet Take 81 mg by mouth daily.   Yes Historical Provider, MD  cholecalciferol (VITAMIN D) 1000 UNITS tablet Take 4,000 Units by mouth daily.    Yes Historical Provider, MD  cloNIDine (CATAPRES) 0.2 MG tablet Take 0.2 mg by mouth 2 (two) times daily.   Yes Historical Provider, MD  furosemide (LASIX) 40 MG tablet Take 40 mg by mouth daily.   Yes Historical Provider, MD  Insulin Detemir (LEVEMIR) 100 UNIT/ML Pen Inject 18 Units into the skin every morning. 02/19/13  Yes Mary B Dixon, PA-C  pioglitazone (ACTOS) 30 MG tablet Take 30 mg by mouth daily.   Yes Historical Provider, MD  simvastatin (ZOCOR) 20 MG tablet Take 20 mg by mouth at bedtime.   Yes Historical Provider, MD      Allergies  Allergen Reactions  . Ace Inhibitors Other (See Comments)    Hyperkalemia  . Aspirin Other (See Comments)    G.I. Upset.   . Metformin And Related Diarrhea     Social History:  reports that she quit smoking about 4 years ago. She has never used smokeless tobacco. She reports that she does not drink alcohol or use illicit drugs.     Family History  Problem Relation Age of Onset  . Cancer Father   . Diabetes Father   . Cancer Brother     Brain  . Cancer Sister     abdominal fat       Physical Exam:  GEN:  Pleasant Obese 73 y.o. Caucasian female  examined and in no acute distress; cooperative with exam Filed Vitals:   09/28/13 0100 09/28/13 0109 09/28/13 0127 09/28/13 0130  BP: 148/63 141/55 157/50 162/55  Pulse: 85 78 80 80  Temp: 97.5 F (36.4 C)  98.3 F (36.8 C)   TempSrc:  Oral Oral   Resp: 22 10 12 16   Height:      Weight:      SpO2: 98% 93% 93% 92%   Blood pressure 162/55, pulse 80, temperature 98.3 F (36.8 C), temperature source Oral, resp. rate 16, height 5\' 3"  (1.6 m), weight 74.39 kg (164 lb), SpO2 92.00%. PSYCH: She is alert and oriented x4; does not appear  anxious does not appear depressed; affect is normal HEENT: Normocephalic and Atraumatic, Mucous membranes pink; PERRLA; EOM intact; Fundi:  Benign;  No scleral icterus, Nares: Patent, Oropharynx: Clear, Sparse Poor Dentition,    Neck:  FROM, No Cervical Lymphadenopathy nor Thyromegaly or Carotid Bruit; No JVD; Breasts:: Not examined CHEST WALL: No tenderness CHEST: Normal respiration, clear to  auscultation bilaterally HEART: Regular rate and rhythm; no murmurs rubs or gallops BACK: No kyphosis or scoliosis; No CVA tenderness ABDOMEN: Decreased Bowel Sounds, Obese, Soft Non-Tender; No Masses, No Organomegaly Rectal Exam: Not done EXTREMITIES: In SCDs,   No Cyanosis, Clubbing, or Edema; No Ulcerations. Genitalia: not examined PULSES: 2+ and symmetric SKIN: Normal hydration no rash or ulceration CNS:  Alert and Oriented x 4, No Focal deficits  Vascular: pulses palpable throughout    Labs on Admission:  Basic Metabolic Panel:  Recent Labs Lab 09/27/13 1733  NA 136*  K 3.8  CL 99  CO2 23  GLUCOSE 235*  BUN 37*  CREATININE 1.70*  CALCIUM 9.0   Liver Function Tests:  Recent Labs Lab 09/27/13 1733  AST 13  ALT 10  ALKPHOS 58  BILITOT 0.4  PROT 7.2  ALBUMIN 3.5    Recent Labs Lab 09/27/13 1733  LIPASE 16   No results found for this basename: AMMONIA,  in the last 168 hours CBC:  Recent Labs Lab 09/27/13 1733  WBC 12.0*  NEUTROABS 10.6*  HGB 9.9*  HCT 29.9*  MCV 84.7  PLT 188   Cardiac Enzymes: No results found for this basename: CKTOTAL, CKMB, CKMBINDEX, TROPONINI,  in the last 168 hours  BNP (last 3 results)  Recent Labs  01/19/13 0157  PROBNP 10409.0*   CBG:  Recent Labs Lab 09/27/13 2338  GLUCAP 205*    Radiological Exams on Admission: Ct Abdomen Pelvis Wo Contrast  09/27/2013   CLINICAL DATA:  Left-sided abdominal pain with nausea and vomiting. History of prior periappendiceal abscess post drainage July 2014.  EXAM: CT ABDOMEN AND PELVIS  WITHOUT CONTRAST  TECHNIQUE: Multidetector CT imaging of the abdomen and pelvis was performed following the standard protocol without IV contrast.  COMPARISON:  12/31/2012, 08/01/2012 and 07/19/2012  FINDINGS: The lung bases demonstrate minimal atelectasis over the posterior left lower lobe. There is minimal focal calcification along the right side of the pericardium unchanged.  Abdominal images demonstrate a normal liver, spleen, pancreas, gallbladder and adrenal glands. Kidneys are normal in size without hydronephrosis or nephrolithiasis. There is moderate calcified plaque involving the abdominal aorta and iliac vessels.  The cecum is located just left of midline in the mid abdomen. There is a blind-ending tubular structure extending from the cecum measuring 1 cm diameter with adjacent inflammatory change likely representing in inflamed appendix. No evidence of perforation or abscess.  There is mild diverticulosis of the colon. Remaining pelvic structures are unchanged. Mild spondylosis of the spine.  IMPRESSION: Findings compatible with mild acute appendicitis. Note that the cecum is located in the mid abdomen just left of midline. No evidence of perforation or abscess.  Mild diverticulosis of the colon.  These results were called by telephone at the time of interpretation on 09/27/2013 at 7:33 pm to Dr. Doy Mince, who verbally acknowledged these results.   Electronically Signed   By: Marin Olp M.D.   On: 09/27/2013 19:33   Dg Chest 2 View  09/27/2013   CLINICAL DATA:  Acute appendicitis.  Preop respiratory exam.  EXAM: CHEST  2 VIEW  COMPARISON:  01/19/2013  FINDINGS: Moderate cardiomegaly and pulmonary vascular congestion again noted. Pulmonary hyperinflation again demonstrated. No evidence of acute pulmonary edema or airspace disease. No evidence of pleural effusion. Prior median sternotomy again noted.  IMPRESSION: Stable cardiomegaly, pulmonary vascular congestion, and probable COPD. No acute findings.    Electronically Signed   By: Earle Gell M.D.   On: 09/27/2013 21:29  Dg Abd Portable 1v  09/28/2013   CLINICAL DATA:  Intraoperative examination for instrument count.  EXAM: PORTABLE ABDOMEN - 1 VIEW  COMPARISON:  CT abdomen and pelvis 09/27/2013.  FINDINGS: No radiopaque surgical foreign bodies are demonstrated in the visualized field of view. Vascular calcifications. Scattered stool in the colon. No small or large bowel distention. Enteric tube demonstrated in the left upper quadrant.  IMPRESSION: Negative for postoperative purposes.  Results were telephoned to the operating room at 2355 hr on 09/27/2013.   Electronically Signed   By: Lucienne Capers M.D.   On: 09/28/2013 00:03     EKG: Independently reviewed. Normal sinus Rhythm rate 80 with 1st Degree AVB, and LVH changes, and evidence of previous Anterior Infarct   Code Status:    FULL CODE Family Communication:   No Family at Bedside    Time spent:  [facility] Pager [phone]   If [place] Please Contact the Day Rounding Team MD for [facility]  If 7PM-7AM, Please Contact night-coverage  [email or web address] Password [email or web address] 09/28/2013, 2:54 AM     Mor

## 2013-09-28 NOTE — Progress Notes (Signed)
Burping/belching; just had some bilious emesis; pain ok. HTN. Elevated blood sugars  Nontoxic cta ant but not deep inspirations Soft, some distension; wound vac ok.  +SCDs  Npo except meds/ice chips/sips of water IS, pulm toilet Cont VTE prophylaxis Cont foley today Appreciate [facility] assitance on blood sugars and HTN  Leighton Ruff. Redmond Pulling, MD, FACS General, Bariatric, & Minimally Invasive Surgery [facility], [place]

## 2013-09-28 NOTE — Progress Notes (Signed)
Patient ID: Laura Best, female   DOB: [birth year], 73 y.o.   MRN: [id number] 1 Day Post-Op  Subjective: Pt feels ok, but has some nausea.  Having some pain, but controlled  Objective: Vital signs in last 24 hours: Temp:  [97.5 F (36.4 C)-100.1 F (37.8 C)] 98.2 F (36.8 C) (09/06 0400) Pulse Rate:  [76-99] 90 (09/06 0700) Resp:  [9-26] 15 (09/06 0700) BP: (138-186)/(42-80) 164/65 mmHg (09/06 0700) SpO2:  [89 %-100 %] 98 % (09/06 0700) Weight:  [163 lb 12.8 oz (74.3 kg)-165 lb (74.844 kg)] 163 lb 12.8 oz (74.3 kg) (09/06 0400)    Intake/Output from previous day: 09/05 0701 - 09/06 0700 In: 2050 [I.V.:1900; IV Piggyback:150] Out: 650 [Urine:600; Blood:50] Intake/Output this shift:    PE: Abd: soft, appropriately tender, wound VAC in place with serosang output, absent BS Heart: regular Lungs: mild bilateral exp wheeze, otherwise clear  Lab Results:   Recent Labs  09/27/13 1733 09/28/13 0235  WBC 12.0* 15.2*  HGB 9.9* 10.0*  HCT 29.9* 30.7*  PLT 188 223   BMET  Recent Labs  09/27/13 1733 09/28/13 0235  NA 136* 140  K 3.8 4.1  CL 99 105  CO2 23 24  GLUCOSE 235* 276*  BUN 37* 32*  CREATININE 1.70* 1.63*  CALCIUM 9.0 8.2*   PT/INR No results found for this basename: LABPROT, INR,  in the last 72 hours CMP     Component Value Date/Time   NA 140 09/28/2013 0235   K 4.1 09/28/2013 0235   CL 105 09/28/2013 0235   CO2 24 09/28/2013 0235   GLUCOSE 276* 09/28/2013 0235   GLUCOSE 163* 07/19/2012 1005   BUN 32* 09/28/2013 0235   CREATININE 1.63* 09/28/2013 0235   CREATININE 1.83* 07/04/2013 0826   CALCIUM 8.2* 09/28/2013 0235   PROT 7.2 09/27/2013 1733   ALBUMIN 3.5 09/27/2013 1733   AST 13 09/27/2013 1733   ALT 10 09/27/2013 1733   ALKPHOS 58 09/27/2013 1733   BILITOT 0.4 09/27/2013 1733   GFRNONAA 30* 09/28/2013 0235   GFRNONAA 27* 07/04/2013 0826   GFRAA 35* 09/28/2013 0235   GFRAA 31* 07/04/2013 0826   Lipase     Component Value Date/Time   LIPASE 16 09/27/2013 1733        Studies/Results: Ct Abdomen Pelvis Wo Contrast  09/27/2013   CLINICAL DATA:  Left-sided abdominal pain with nausea and vomiting. History of prior periappendiceal abscess post drainage July 2014.  EXAM: CT ABDOMEN AND PELVIS WITHOUT CONTRAST  TECHNIQUE: Multidetector CT imaging of the abdomen and pelvis was performed following the standard protocol without IV contrast.  COMPARISON:  12/31/2012, 08/01/2012 and 07/19/2012  FINDINGS: The lung bases demonstrate minimal atelectasis over the posterior left lower lobe. There is minimal focal calcification along the right side of the pericardium unchanged.  Abdominal images demonstrate a normal liver, spleen, pancreas, gallbladder and adrenal glands. Kidneys are normal in size without hydronephrosis or nephrolithiasis. There is moderate calcified plaque involving the abdominal aorta and iliac vessels.  The cecum is located just left of midline in the mid abdomen. There is a blind-ending tubular structure extending from the cecum measuring 1 cm diameter with adjacent inflammatory change likely representing in inflamed appendix. No evidence of perforation or abscess.  There is mild diverticulosis of the colon. Remaining pelvic structures are unchanged. Mild spondylosis of the spine.  IMPRESSION: Findings compatible with mild acute appendicitis. Note that the cecum is located in the mid abdomen just left of midline. No  evidence of perforation or abscess.  Mild diverticulosis of the colon.  These results were called by telephone at the time of interpretation on 09/27/2013 at 7:33 pm to Dr. Doy Mince, who verbally acknowledged these results.   Electronically Signed   By: Marin Olp M.D.   On: 09/27/2013 19:33   Dg Chest 2 View  09/27/2013   CLINICAL DATA:  Acute appendicitis.  Preop respiratory exam.  EXAM: CHEST  2 VIEW  COMPARISON:  01/19/2013  FINDINGS: Moderate cardiomegaly and pulmonary vascular congestion again noted. Pulmonary hyperinflation again  demonstrated. No evidence of acute pulmonary edema or airspace disease. No evidence of pleural effusion. Prior median sternotomy again noted.  IMPRESSION: Stable cardiomegaly, pulmonary vascular congestion, and probable COPD. No acute findings.   Electronically Signed   By: Earle Gell M.D.   On: 09/27/2013 21:29   Dg Abd Portable 1v  09/28/2013   CLINICAL DATA:  Intraoperative examination for instrument count.  EXAM: PORTABLE ABDOMEN - 1 VIEW  COMPARISON:  CT abdomen and pelvis 09/27/2013.  FINDINGS: No radiopaque surgical foreign bodies are demonstrated in the visualized field of view. Vascular calcifications. Scattered stool in the colon. No small or large bowel distention. Enteric tube demonstrated in the left upper quadrant.  IMPRESSION: Negative for postoperative purposes.  Results were telephoned to the operating room at 2355 hr on 09/27/2013.   Electronically Signed   By: Lucienne Capers M.D.   On: 09/28/2013 00:03    Anti-infectives: Anti-infectives   Start     Dose/Rate Route Frequency Ordered Stop   09/28/13 0200  metroNIDAZOLE (FLAGYL) IVPB 500 mg     500 mg 100 mL/hr over 60 Minutes Intravenous Every 8 hours 09/28/13 0022     09/28/13 0200  cefTRIAXone (ROCEPHIN) 1 g in dextrose 5 % 50 mL IVPB     1 g 100 mL/hr over 30 Minutes Intravenous Daily at bedtime 09/28/13 0123     09/27/13 1715  piperacillin-tazobactam (ZOSYN) IVPB 3.375 g     3.375 g 12.5 mL/hr over 240 Minutes Intravenous  Once 09/27/13 1703 09/27/13 2203       Assessment/Plan  1. Acute perforated appendicitis with abscess 2. Mild ARI, on chronic kidney disease 3. HTN  4. DM 5. Diastolic heart failure  Plan: 1. Recheck labs in am 2. Leave in SDU today, if doing well, will plan on tx to floor tomorrow 3. Leave foley for monitoring today, plan for dc tomorrow if UOP good 4. Cont abx therapy D2/7 Rocephin/Flagyl 5. Cont anti-hypertensives and SSI for HTN and DM.  Appreciate medicine's assistance with this  patient. 6. Leave on clear liquids due to minimal BS and nausea.  LOS: 1 day    Madilyne Tadlock E 09/28/2013, 8:20 AM Pager: [phone]

## 2013-09-29 DIAGNOSIS — K3533 Acute appendicitis with perforation and localized peritonitis, with abscess: Principal | ICD-10-CM

## 2013-09-29 LAB — BASIC METABOLIC PANEL
Anion gap: 11 (ref 5–15)
BUN: 28 mg/dL — ABNORMAL HIGH (ref 6–23)
CALCIUM: 7.4 mg/dL — AB (ref 8.4–10.5)
CO2: 22 mEq/L (ref 19–32)
CREATININE: 1.77 mg/dL — AB (ref 0.50–1.10)
Chloride: 104 mEq/L (ref 96–112)
GFR calc Af Amer: 32 mL/min — ABNORMAL LOW (ref >90)
GFR calc non Af Amer: 27 mL/min — ABNORMAL LOW (ref >90)
GLUCOSE: 282 mg/dL — AB (ref 70–99)
Potassium: 4 mEq/L (ref 3.7–5.3)
SODIUM: 137 meq/L (ref 137–147)

## 2013-09-29 LAB — CBC
HCT: 25.5 % — ABNORMAL LOW (ref 36.0–46.0)
Hemoglobin: 8.2 g/dL — ABNORMAL LOW (ref 12.0–15.0)
MCH: 28.8 pg (ref 26.0–34.0)
MCHC: 32.2 g/dL (ref 30.0–36.0)
MCV: 89.5 fL (ref 78.0–100.0)
Platelets: 159 10*3/uL (ref 150–400)
RBC: 2.85 MIL/uL — AB (ref 3.87–5.11)
RDW: 15.7 % — ABNORMAL HIGH (ref 11.5–15.5)
WBC: 8.6 10*3/uL (ref 4.0–10.5)

## 2013-09-29 LAB — GLUCOSE, CAPILLARY
GLUCOSE-CAPILLARY: 148 mg/dL — AB (ref 70–99)
GLUCOSE-CAPILLARY: 157 mg/dL — AB (ref 70–99)
Glucose-Capillary: 229 mg/dL — ABNORMAL HIGH (ref 70–99)
Glucose-Capillary: 137 mg/dL — ABNORMAL HIGH (ref 70–99)

## 2013-09-29 MED ORDER — INSULIN DETEMIR 100 UNIT/ML ~~LOC~~ SOLN
14.0000 [IU] | Freq: Every day | SUBCUTANEOUS | Status: DC
  Filled 2013-09-29: qty 0.14

## 2013-09-29 NOTE — Progress Notes (Signed)
Inpatient Diabetes Program Recommendations  AACE/ADA: New Consensus Statement on Inpatient Glycemic Control (2013)  Target Ranges:  Prepandial:   less than 140 mg/dL      Peak postprandial:   less than 180 mg/dL (1-2 hours)      Critically ill patients:  140 - 180 mg/dL   Inpatient Diabetes Program Recommendations Insulin - Basal: Please consider increase basal levemir to 15 units as pt takes 18 units at home. Fasting high in upper 200's  MD, please add DM to active in-hospital problem list. Thank you, Rosita Kea, RN, CNS,Diabetes Coordinator

## 2013-09-29 NOTE — Consult Note (Signed)
[facility] TEAM 1 - Stepdown/ICU TEAM Consult F/U Note  Laura Best [id number] DOB: February 21, 1940 DOA: 09/27/2013 PCP: Dena Billet BETH, PA-C  Admit HPI / Brief Narrative: 73 yo F with a history of HTN, Diastolic CHF, DM2, Hyperlipidemia, and Stage III CKD who presented to the ED with complaints of LLQ ABD Pain with nausea/vomiting and was found on CT scan to have appendicitis with peritonitis on CT scan.  Dr. [name] took her to the OR for an appendectomy. [facility] were consulted for medical management.   HPI/Subjective: Pt is tolerating a clear liquid diet w/o difficulty.  She has no new complaints today.    Assessment/Plan:  Acute appendicitis with peritoneal abscess Per primary service (Gen Surgery)  DM CBG poorly controlled, but lantus just being resumed this morning - will follow CBG th/o the day and adjust tx as indicated - titration back toward home does will be indicated if intake improves - of note, the pt is intolerant to glucophage   HTN BP better controlled at present, but not yet ideal - follow w/o change today  - negative renal artery [place] in past   HLD Resume Zocor once full diet resumed and well tolerated   Tricuspid regurg Trivial per TTE Dec 123456  Chronic diastolic CHF TTE Dec 123456 w/ EF 55% and grade 2 DD - euvolemic at present   Mild acute kidney injury on CKD stage III (GFR 30-59 ml/min) Follow - assure pt remains well hydrated   Vitamin D deficiency  Pulmonary hypertension / Mild R heart failure  PA pressure via TTE Dec 2014 33mm Hg - avoid hypovolemia   COPD Quit smoking 4 years ago  Code Status: FULL Family Communication: no family present at time of exam  DVT prophylaxis: lovenox  Objective: Blood pressure 134/47, pulse 66, temperature 99.8 F (37.7 C), temperature source Oral, resp. rate 20, height 5\' 3"  (1.6 m), weight 74.3 kg (163 lb 12.8 oz), SpO2 95.00%.  Intake/Output Summary (Last 24 hours) at 09/29/13 1528 Last data  filed at 09/29/13 1200  Gross per 24 hour  Intake   1250 ml  Output    750 ml  Net    500 ml   Exam: General: No acute respiratory distress Lungs: Clear to auscultation bilaterally without wheezes or crackles Cardiovascular: Regular rate and rhythm without murmur gallop or rub  Abdomen: soft, bs +, midline wound vac in place, non distended, no rebound  Extremities: No significant cyanosis, clubbing, or edema bilateral lower extremities  Data Reviewed: Basic Metabolic Panel:  Recent Labs Lab 09/27/13 1733 09/28/13 0235 09/29/13 0208  NA 136* 140 137  K 3.8 4.1 4.0  CL 99 105 104  CO2 23 24 22   GLUCOSE 235* 276* 282*  BUN 37* 32* 28*  CREATININE 1.70* 1.63* 1.77*  CALCIUM 9.0 8.2* 7.4*    Liver Function Tests:  Recent Labs Lab 09/27/13 1733  AST 13  ALT 10  ALKPHOS 58  BILITOT 0.4  PROT 7.2  ALBUMIN 3.5    Recent Labs Lab 09/27/13 1733  LIPASE 16   CBC:  Recent Labs Lab 09/27/13 1733 09/28/13 0235 09/29/13 0208  WBC 12.0* 15.2* 8.6  NEUTROABS 10.6*  --   --   HGB 9.9* 10.0* 8.2*  HCT 29.9* 30.7* 25.5*  MCV 84.7 87.0 89.5  PLT 188 223 159    CBG:  Recent Labs Lab 09/28/13 1603 09/28/13 1730 09/28/13 2111 09/29/13 0736 09/29/13 1156  GLUCAP 199* 206* 235* 229* 148*  Recent Results (from the past 240 hour(s))  CULTURE, BLOOD (ROUTINE X 2)     Status: None   Collection Time    09/27/13  5:30 PM      Result Value Ref Range Status   Specimen Description BLOOD LEFT ARM   Final   Special Requests BOTTLES DRAWN AEROBIC AND ANAEROBIC BLUE 10 RED 5   Final   Culture  Setup Time     Final   Value: 09/28/2013 02:09     Performed at [facility]   Culture     Final   Value:        BLOOD CULTURE RECEIVED NO GROWTH TO DATE CULTURE WILL BE HELD FOR 5 DAYS BEFORE ISSUING A FINAL NEGATIVE REPORT     Performed at [facility]   Report Status PENDING   Incomplete  CULTURE, BLOOD (ROUTINE X 2)     Status: None   Collection Time     09/27/13  5:37 PM      Result Value Ref Range Status   Specimen Description BLOOD LEFT HAND   Final   Special Requests BOTTLES DRAWN AEROBIC ONLY 10 CC   Final   Culture  Setup Time     Final   Value: 09/28/2013 02:09     Performed at [facility]   Culture     Final   Value:        BLOOD CULTURE RECEIVED NO GROWTH TO DATE CULTURE WILL BE HELD FOR 5 DAYS BEFORE ISSUING A FINAL NEGATIVE REPORT     Performed at [facility]   Report Status PENDING   Incomplete  MRSA PCR SCREENING     Status: None   Collection Time    09/28/13  1:32 AM      Result Value Ref Range Status   MRSA by PCR NEGATIVE  NEGATIVE Final   Comment:            The GeneXpert MRSA Assay (FDA     approved for NASAL specimens     only), is one component of a     comprehensive MRSA colonization     surveillance program. It is not     intended to diagnose MRSA     infection nor to guide or     monitor treatment for     MRSA infections.     Studies:  Recent x-ray studies have been reviewed in detail by the Attending Physician  Scheduled Meds:  Scheduled Meds: . amLODipine  10 mg Oral Daily  . cefTRIAXone (ROCEPHIN)  IV  1 g Intravenous QHS  . cloNIDine  0.2 mg Oral TID  . enoxaparin (LOVENOX) injection  30 mg Subcutaneous Daily  . insulin aspart  0-15 Units Subcutaneous TID WC  . insulin detemir  10 Units Subcutaneous Daily  . metronidazole  500 mg Intravenous Q8H    Time spent on care of this patient: 35 mins   Britlee Skolnik T , MD   [facility] Office  [phone] Pager - Text Page per Shea Evans as per below:  On-Call/Text Page:      [email or web address]      password [email or web address]  If 7PM-7AM, please contact night-coverage [email or web address] Password [email or web address] 09/29/2013, 3:28 PM   LOS: 2 days

## 2013-09-29 NOTE — Consult Note (Signed)
VAC changed per bedside nursing staff this am prior to my arrival. Will keep on our list to follow along, however she is transferring to 6N and bedside nursing staff should be ok with changing on that floor.   Jessia Kief [place] RN,CWOCN [id number]

## 2013-09-29 NOTE — Progress Notes (Signed)
Patient ID: Laura Best, female   DOB: [birth year], 73 y.o.   MRN: [id number] 2 Days Post-Op  Subjective: Pt feels much better today. No further nausea.  Tolerating clear liquids with no issues.  No flatus just yet  Objective: Vital signs in last 24 hours: Temp:  [98.8 F (37.1 C)-100 F (37.8 C)] 99.8 F (37.7 C) (09/07 0730) Pulse Rate:  [62-90] 62 (09/07 0600) Resp:  [9-23] 18 (09/07 0730) BP: (113-172)/(30-65) 136/40 mmHg (09/07 0730) SpO2:  [94 %-100 %] 94 % (09/07 0730)    Intake/Output from previous day: 09/06 0701 - 09/07 0700 In: 1870 [P.O.:820; I.V.:700; IV Piggyback:350] Out: 925 [Urine:900; Drains:25] Intake/Output this shift:    PE: Abd: soft, appropriately tender, minimal distention, +BS, incision with wound VAC in place.  Heart: regular Lungs: CTAB  Lab Results:   Recent Labs  09/28/13 0235 09/29/13 0208  WBC 15.2* 8.6  HGB 10.0* 8.2*  HCT 30.7* 25.5*  PLT 223 159   BMET  Recent Labs  09/28/13 0235 09/29/13 0208  NA 140 137  K 4.1 4.0  CL 105 104  CO2 24 22  GLUCOSE 276* 282*  BUN 32* 28*  CREATININE 1.63* 1.77*  CALCIUM 8.2* 7.4*   PT/INR No results found for this basename: LABPROT, INR,  in the last 72 hours CMP     Component Value Date/Time   NA 137 09/29/2013 0208   K 4.0 09/29/2013 0208   CL 104 09/29/2013 0208   CO2 22 09/29/2013 0208   GLUCOSE 282* 09/29/2013 0208   GLUCOSE 163* 07/19/2012 1005   BUN 28* 09/29/2013 0208   CREATININE 1.77* 09/29/2013 0208   CREATININE 1.83* 07/04/2013 0826   CALCIUM 7.4* 09/29/2013 0208   PROT 7.2 09/27/2013 1733   ALBUMIN 3.5 09/27/2013 1733   AST 13 09/27/2013 1733   ALT 10 09/27/2013 1733   ALKPHOS 58 09/27/2013 1733   BILITOT 0.4 09/27/2013 1733   GFRNONAA 27* 09/29/2013 0208   GFRNONAA 27* 07/04/2013 0826   GFRAA 32* 09/29/2013 0208   GFRAA 31* 07/04/2013 0826   Lipase     Component Value Date/Time   LIPASE 16 09/27/2013 1733       Studies/Results: Ct Abdomen Pelvis Wo Contrast  09/27/2013   CLINICAL DATA:   Left-sided abdominal pain with nausea and vomiting. History of prior periappendiceal abscess post drainage July 2014.  EXAM: CT ABDOMEN AND PELVIS WITHOUT CONTRAST  TECHNIQUE: Multidetector CT imaging of the abdomen and pelvis was performed following the standard protocol without IV contrast.  COMPARISON:  12/31/2012, 08/01/2012 and 07/19/2012  FINDINGS: The lung bases demonstrate minimal atelectasis over the posterior left lower lobe. There is minimal focal calcification along the right side of the pericardium unchanged.  Abdominal images demonstrate a normal liver, spleen, pancreas, gallbladder and adrenal glands. Kidneys are normal in size without hydronephrosis or nephrolithiasis. There is moderate calcified plaque involving the abdominal aorta and iliac vessels.  The cecum is located just left of midline in the mid abdomen. There is a blind-ending tubular structure extending from the cecum measuring 1 cm diameter with adjacent inflammatory change likely representing in inflamed appendix. No evidence of perforation or abscess.  There is mild diverticulosis of the colon. Remaining pelvic structures are unchanged. Mild spondylosis of the spine.  IMPRESSION: Findings compatible with mild acute appendicitis. Note that the cecum is located in the mid abdomen just left of midline. No evidence of perforation or abscess.  Mild diverticulosis of the colon.  These results were  called by telephone at the time of interpretation on 09/27/2013 at 7:33 pm to Dr. Doy Mince, who verbally acknowledged these results.   Electronically Signed   By: Marin Olp M.D.   On: 09/27/2013 19:33   Dg Chest 2 View  09/27/2013   CLINICAL DATA:  Acute appendicitis.  Preop respiratory exam.  EXAM: CHEST  2 VIEW  COMPARISON:  01/19/2013  FINDINGS: Moderate cardiomegaly and pulmonary vascular congestion again noted. Pulmonary hyperinflation again demonstrated. No evidence of acute pulmonary edema or airspace disease. No evidence of pleural  effusion. Prior median sternotomy again noted.  IMPRESSION: Stable cardiomegaly, pulmonary vascular congestion, and probable COPD. No acute findings.   Electronically Signed   By: Earle Gell M.D.   On: 09/27/2013 21:29   Dg Abd Portable 1v  09/28/2013   CLINICAL DATA:  Intraoperative examination for instrument count.  EXAM: PORTABLE ABDOMEN - 1 VIEW  COMPARISON:  CT abdomen and pelvis 09/27/2013.  FINDINGS: No radiopaque surgical foreign bodies are demonstrated in the visualized field of view. Vascular calcifications. Scattered stool in the colon. No small or large bowel distention. Enteric tube demonstrated in the left upper quadrant.  IMPRESSION: Negative for postoperative purposes.  Results were telephoned to the operating room at 2355 hr on 09/27/2013.   Electronically Signed   By: Lucienne Capers M.D.   On: 09/28/2013 00:03    Anti-infectives: Anti-infectives   Start     Dose/Rate Route Frequency Ordered Stop   09/28/13 0200  metroNIDAZOLE (FLAGYL) IVPB 500 mg     500 mg 100 mL/hr over 60 Minutes Intravenous Every 8 hours 09/28/13 0022     09/28/13 0200  cefTRIAXone (ROCEPHIN) 1 g in dextrose 5 % 50 mL IVPB     1 g 100 mL/hr over 30 Minutes Intravenous Daily at bedtime 09/28/13 0123     09/27/13 1715  piperacillin-tazobactam (ZOSYN) IVPB 3.375 g     3.375 g 12.5 mL/hr over 240 Minutes Intravenous  Once 09/27/13 1703 09/27/13 2203       Assessment/Plan  1. POD 2, s/p lap converted to open appy for perforated appendicitis 2. HTN 3. DM 4. CKD  Plan: 1. Patient doing well.  Will transfer to the floor. 2. Patient with no further nausea, good bowel sounds.  Will give full liquids, but will not advance until she has some flatus 3. Mobilize and pulm toilet 4. Appreciate medicine assistance with medical problems 5. WOC consult for assistance with VAC change.  Will start on a M,W,F schedule  LOS: 2 days    Graclyn Lawther E 09/29/2013, 8:06 AM Pager: [phone]

## 2013-09-29 NOTE — Progress Notes (Signed)
Foley catheter removed per protocol. Pt immediately felt urge to void post removal. Pt OOB to [facility] with stand-by assist. Pt unable to void at this time. RN reminded pt to use call bell when she felt urge void again. RN gave pt choice to get into chair or to go back to bed. Pt chose to get back into bed. Pt denied pain, dizziness, SOB, or weakness before, during, and after intervention. Will continue to monitor and assess pt.

## 2013-09-29 NOTE — Progress Notes (Signed)
Improving Transfer to floor.  [name]. [name], MD, [facility] Surgery  General/ Trauma Surgery  09/29/2013 9:29 AM

## 2013-09-30 ENCOUNTER — Encounter (HOSPITAL_COMMUNITY): Payer: Self-pay | Admitting: Surgery

## 2013-09-30 DIAGNOSIS — E1165 Type 2 diabetes mellitus with hyperglycemia: Secondary | ICD-10-CM

## 2013-09-30 DIAGNOSIS — N183 Chronic kidney disease, stage 3 unspecified: Secondary | ICD-10-CM

## 2013-09-30 DIAGNOSIS — IMO0001 Reserved for inherently not codable concepts without codable children: Secondary | ICD-10-CM

## 2013-09-30 LAB — COMPREHENSIVE METABOLIC PANEL
ALT: 8 U/L (ref 0–35)
AST: 11 U/L (ref 0–37)
Albumin: 2.5 g/dL — ABNORMAL LOW (ref 3.5–5.2)
Alkaline Phosphatase: 57 U/L (ref 39–117)
Anion gap: 12 (ref 5–15)
BUN: 30 mg/dL — ABNORMAL HIGH (ref 6–23)
CALCIUM: 7.7 mg/dL — AB (ref 8.4–10.5)
CO2: 23 mEq/L (ref 19–32)
CREATININE: 1.96 mg/dL — AB (ref 0.50–1.10)
Chloride: 101 mEq/L (ref 96–112)
GFR calc non Af Amer: 24 mL/min — ABNORMAL LOW (ref >90)
GFR, EST AFRICAN AMERICAN: 28 mL/min — AB (ref >90)
Glucose, Bld: 252 mg/dL — ABNORMAL HIGH (ref 70–99)
Potassium: 4.1 mEq/L (ref 3.7–5.3)
SODIUM: 136 meq/L — AB (ref 137–147)
Total Bilirubin: <0.2 mg/dL — ABNORMAL LOW (ref 0.3–1.2)
Total Protein: 6.1 g/dL (ref 6.0–8.3)

## 2013-09-30 LAB — GLUCOSE, CAPILLARY
GLUCOSE-CAPILLARY: 230 mg/dL — AB (ref 70–99)
GLUCOSE-CAPILLARY: 108 mg/dL — AB (ref 70–99)
Glucose-Capillary: 193 mg/dL — ABNORMAL HIGH (ref 70–99)
Glucose-Capillary: 179 mg/dL — ABNORMAL HIGH (ref 70–99)

## 2013-09-30 MED ORDER — ALBUTEROL SULFATE (2.5 MG/3ML) 0.083% IN NEBU: 2.5000 mg | INHALATION_SOLUTION | RESPIRATORY_TRACT | Status: DC | PRN

## 2013-09-30 MED ORDER — TRAMADOL HCL 50 MG PO TABS: 50.0000 mg | ORAL_TABLET | Freq: Four times a day (QID) | ORAL | Status: DC | PRN

## 2013-09-30 MED ORDER — DOCUSATE SODIUM 100 MG PO CAPS
100.0000 mg | ORAL_CAPSULE | Freq: Two times a day (BID) | ORAL | Status: DC
  Administered 2013-09-30 – 2013-10-03 (×7): 100 mg via ORAL
  Filled 2013-09-30 (×7): qty 1

## 2013-09-30 MED ORDER — INSULIN DETEMIR 100 UNIT/ML ~~LOC~~ SOLN
15.0000 [IU] | Freq: Every day | SUBCUTANEOUS | Status: DC
  Administered 2013-09-30: 15 [IU] via SUBCUTANEOUS
  Filled 2013-09-30: qty 0.15

## 2013-09-30 MED ORDER — INSULIN DETEMIR 100 UNIT/ML ~~LOC~~ SOLN
18.0000 [IU] | Freq: Every day | SUBCUTANEOUS | Status: DC
  Administered 2013-10-01 – 2013-10-03 (×3): 18 [IU] via SUBCUTANEOUS
  Filled 2013-09-30 (×3): qty 0.18

## 2013-09-30 MED ORDER — ACETAMINOPHEN 325 MG PO TABS
325.0000 mg | ORAL_TABLET | Freq: Four times a day (QID) | ORAL | Status: DC | PRN
  Administered 2013-09-30: 325 mg via ORAL
  Filled 2013-09-30: qty 2

## 2013-09-30 MED ORDER — TAMSULOSIN HCL 0.4 MG PO CAPS
0.4000 mg | ORAL_CAPSULE | Freq: Every day | ORAL | Status: DC
  Administered 2013-09-30 – 2013-10-03 (×4): 0.4 mg via ORAL
  Filled 2013-09-30 (×4): qty 1

## 2013-09-30 MED ORDER — FUROSEMIDE 40 MG PO TABS
40.0000 mg | ORAL_TABLET | Freq: Every day | ORAL | Status: DC
  Administered 2013-09-30 – 2013-10-03 (×4): 40 mg via ORAL
  Filled 2013-09-30 (×4): qty 1

## 2013-09-30 NOTE — Consult Note (Signed)
[facility] PROGRESS NOTE  Laura Best [id number] DOB: 12/18/1940 DOA: 09/27/2013 PCP: [name], PA-C  Assessment/Plan: 73 yo F with a history of HTN, Diastolic CHF, DM2, Hyperlipidemia, and Stage III CKD who presented to the ED with complaints of LLQ ABD Pain with nausea/vomiting and was found on CT scan to have appendicitis with peritonitis on CT scan. Dr. [name] took her to the OR for an appendectomy. [facility] were consulted for medical management.   1. Acute appendicitis with peritoneal abscess  -9/5 surgery; Per primary service (Gen Surgery)   2. DM Ha1c-7.6; resume insulin, added ISS: d/w pioglitozone due to CHF 3. HTN negative renal artery [place] in past; resume clonidine, amlodipine; titrate as needed 4. Chronic diastolic CHF TTE Dec 123456 w/ EF 55% and grade 2 DD - euvolemic at present  -resume home diuretic; d/c pioglitozone; allergic to ACE; if tolerated may benefit from BB, but COPD  5. CKD stage III (GFR 30-59 ml/min); resume home diuretics; monitor renal function   6. Pulmonary hypertension / Mild R heart failure PA pressure via TTE Dec 2014 77mm Hg; cont diuretic, monitor   7. COPD no wheezing on exam; cont inhalers; Quit smoking 4 years ago  Code Status: full Family Communication:  D/w patient  (indicate person spoken with, relationship, and if by phone, the number) Disposition Plan: pend PT   Consultants:  surgery   Procedures:    Antibiotics:  Ceftriaxone 9/6<<<  Metronidazole 9/6<<<<   (indicate start date, and stop date if known)  HPI/Subjective: alert  Objective: Filed Vitals:   09/30/13 0944  BP: 133/43  Pulse: 49  Temp:   Resp:     Intake/Output Summary (Last 24 hours) at 09/30/13 1119 Last data filed at 09/30/13 0525  Gross per 24 hour  Intake    990 ml  Output    750 ml  Net    240 ml   Filed Weights   09/27/13 1646 09/27/13 2132 09/28/13 0400  Weight: 74.844 kg (165 lb) 74.39 kg (164 lb) 74.3 kg (163 lb  12.8 oz)    Exam:   General:  alert  Cardiovascular: s1,s2 rrr  Respiratory: few crackles in LL  Abdomen: soft, mild tender, nd  Musculoskeletal: no LE edema   Data Reviewed: Basic Metabolic Panel:  Recent Labs Lab 09/27/13 1733 09/28/13 0235 09/29/13 0208 09/30/13 0530  NA 136* 140 137 136*  K 3.8 4.1 4.0 4.1  CL 99 105 104 101  CO2 23 24 22 23   GLUCOSE 235* 276* 282* 252*  BUN 37* 32* 28* 30*  CREATININE 1.70* 1.63* 1.77* 1.96*  CALCIUM 9.0 8.2* 7.4* 7.7*   Liver Function Tests:  Recent Labs Lab 09/27/13 1733 09/30/13 0530  AST 13 11  ALT 10 8  ALKPHOS 58 57  BILITOT 0.4 <0.2*  PROT 7.2 6.1  ALBUMIN 3.5 2.5*    Recent Labs Lab 09/27/13 1733  LIPASE 16   No results found for this basename: AMMONIA,  in the last 168 hours CBC:  Recent Labs Lab 09/27/13 1733 09/28/13 0235 09/29/13 0208  WBC 12.0* 15.2* 8.6  NEUTROABS 10.6*  --   --   HGB 9.9* 10.0* 8.2*  HCT 29.9* 30.7* 25.5*  MCV 84.7 87.0 89.5  PLT 188 223 159   Cardiac Enzymes: No results found for this basename: CKTOTAL, CKMB, CKMBINDEX, TROPONINI,  in the last 168 hours BNP (last 3 results)  Recent Labs  01/19/13 0157  PROBNP 10409.0*   CBG:  Recent  Labs Lab 09/29/13 0736 09/29/13 1156 09/29/13 1721 09/29/13 2140 09/30/13 0742  GLUCAP 229* 148* 137* 157* 230*    Recent Results (from the past 240 hour(s))  CULTURE, BLOOD (ROUTINE X 2)     Status: None   Collection Time    09/27/13  5:30 PM      Result Value Ref Range Status   Specimen Description BLOOD LEFT ARM   Final   Special Requests BOTTLES DRAWN AEROBIC AND ANAEROBIC BLUE 10 RED 5   Final   Culture  Setup Time     Final   Value: 09/28/2013 02:09     Performed at [facility]   Culture     Final   Value:        BLOOD CULTURE RECEIVED NO GROWTH TO DATE CULTURE WILL BE HELD FOR 5 DAYS BEFORE ISSUING A FINAL NEGATIVE REPORT     Performed at [facility]   Report Status PENDING   Incomplete   CULTURE, BLOOD (ROUTINE X 2)     Status: None   Collection Time    09/27/13  5:37 PM      Result Value Ref Range Status   Specimen Description BLOOD LEFT HAND   Final   Special Requests BOTTLES DRAWN AEROBIC ONLY 10 CC   Final   Culture  Setup Time     Final   Value: 09/28/2013 02:09     Performed at [facility]   Culture     Final   Value:        BLOOD CULTURE RECEIVED NO GROWTH TO DATE CULTURE WILL BE HELD FOR 5 DAYS BEFORE ISSUING A FINAL NEGATIVE REPORT     Performed at [facility]   Report Status PENDING   Incomplete  MRSA PCR SCREENING     Status: None   Collection Time    09/28/13  1:32 AM      Result Value Ref Range Status   MRSA by PCR NEGATIVE  NEGATIVE Final   Comment:            The GeneXpert MRSA Assay (FDA     approved for NASAL specimens     only), is one component of a     comprehensive MRSA colonization     surveillance program. It is not     intended to diagnose MRSA     infection nor to guide or     monitor treatment for     MRSA infections.     Studies: No results found.  Scheduled Meds: . amLODipine  10 mg Oral Daily  . cefTRIAXone (ROCEPHIN)  IV  1 g Intravenous QHS  . cloNIDine  0.2 mg Oral TID  . docusate sodium  100 mg Oral BID  . enoxaparin (LOVENOX) injection  30 mg Subcutaneous Daily  . furosemide  40 mg Oral Daily  . insulin aspart  0-15 Units Subcutaneous TID WC  . insulin detemir  15 Units Subcutaneous Daily  . metronidazole  500 mg Intravenous Q8H  . tamsulosin  0.4 mg Oral Daily   Continuous Infusions:   Active Problems:   Acute appendicitis with peritoneal abscess    Time spent: >35 minutes     Kinnie Feil  [facility] Pager [phone]. If 7PM-7AM, please contact night-coverage at [email or web address], password [email or web address] 09/30/2013, 11:19 AM  LOS: 3 days

## 2013-09-30 NOTE — Progress Notes (Signed)
Patient ID: Laura Best, female   DOB: May 17, 1940, 73 y.o.   MRN: [id number]     [facility]      [address] [place], [place] [address]    Phone: [phone] FAX: [phone]     Subjective: RLQ pain.  Afebrile.  VSS.    Objective:  Vital signs:  Filed Vitals:   09/29/13 1300 09/29/13 1414 09/29/13 2143 09/30/13 0523  BP: 139/47 134/47 128/47 133/53  Pulse: 68 66 61 60  Temp:  99.8 F (37.7 C) 97.9 F (36.6 C) 98 F (36.7 C)  TempSrc:  Oral Oral Oral  Resp: $Remo'25 20 20 20  'ABFMd$ Height:      Weight:      SpO2: 99% 95% 95% 95%    Last BM Date: 09/27/13  Intake/Output   Yesterday:  09/07 0701 - 09/08 0700 In: 1330 [P.O.:1080; IV Piggyback:250] Out: 750 [Urine:725; Drains:25] This shift:    I/O last 3 completed shifts: In: 1720 [P.O.:1320; IV Piggyback:400] Out: 1200 [EEFEO:7121; Drains:25]   Physical Exam: General: Pt awake/alert/oriented x4 in no acute distress Chest: cta. No chest wall pain w good excursion CV:  Pulses intact.  Regular rhythm MS: Normal AROM mjr joints.  No obvious deformity Abdomen: Soft.  Nondistended. Appropriately tender.  Midline wound, VAC, lap sites are c/d/i.  No evidence of peritonitis.  No incarcerated hernias. Ext:  SCDs BLE.  No mjr edema.  No cyanosis Skin: No petechiae / purpura   Problem List:   Active Problems:   Acute appendicitis with peritoneal abscess    Results:   Labs: Results for orders placed during the hospital encounter of 09/27/13 (from the past 48 hour(s))  GLUCOSE, CAPILLARY     Status: Abnormal   Collection Time    09/28/13  8:28 AM      Result Value Ref Range   Glucose-Capillary 251 (*) 70 - 99 mg/dL  GLUCOSE, CAPILLARY     Status: Abnormal   Collection Time    09/28/13 12:03 PM      Result Value Ref Range   Glucose-Capillary 134 (*) 70 - 99 mg/dL  GLUCOSE, CAPILLARY     Status: Abnormal   Collection Time    09/28/13  4:03 PM      Result Value Ref  Range   Glucose-Capillary 199 (*) 70 - 99 mg/dL  GLUCOSE, CAPILLARY     Status: Abnormal   Collection Time    09/28/13  5:30 PM      Result Value Ref Range   Glucose-Capillary 206 (*) 70 - 99 mg/dL  GLUCOSE, CAPILLARY     Status: Abnormal   Collection Time    09/28/13  9:11 PM      Result Value Ref Range   Glucose-Capillary 235 (*) 70 - 99 mg/dL  CBC     Status: Abnormal   Collection Time    09/29/13  2:08 AM      Result Value Ref Range   WBC 8.6  4.0 - 10.5 K/uL   RBC 2.85 (*) 3.87 - 5.11 MIL/uL   Hemoglobin 8.2 (*) 12.0 - 15.0 g/dL   Comment: REPEATED TO VERIFY   HCT 25.5 (*) 36.0 - 46.0 %   MCV 89.5  78.0 - 100.0 fL   MCH 28.8  26.0 - 34.0 pg   MCHC 32.2  30.0 - 36.0 g/dL   RDW 15.7 (*) 11.5 - 15.5 %   Platelets 159  150 - 400 K/uL  Comment: REPEATED TO VERIFY  BASIC METABOLIC PANEL     Status: Abnormal   Collection Time    09/29/13  2:08 AM      Result Value Ref Range   Sodium 137  137 - 147 mEq/L   Potassium 4.0  3.7 - 5.3 mEq/L   Chloride 104  96 - 112 mEq/L   CO2 22  19 - 32 mEq/L   Glucose, Bld 282 (*) 70 - 99 mg/dL   BUN 28 (*) 6 - 23 mg/dL   Creatinine, Ser 1.77 (*) 0.50 - 1.10 mg/dL   Calcium 7.4 (*) 8.4 - 10.5 mg/dL   GFR calc non Af Amer 27 (*) >90 mL/min   GFR calc Af Amer 32 (*) >90 mL/min   Comment: (NOTE)     The eGFR has been calculated using the CKD EPI equation.     This calculation has not been validated in all clinical situations.     eGFR's persistently <90 mL/min signify possible Chronic Kidney     Disease.   Anion gap 11  5 - 15  GLUCOSE, CAPILLARY     Status: Abnormal   Collection Time    09/29/13  7:36 AM      Result Value Ref Range   Glucose-Capillary 229 (*) 70 - 99 mg/dL  GLUCOSE, CAPILLARY     Status: Abnormal   Collection Time    09/29/13 11:56 AM      Result Value Ref Range   Glucose-Capillary 148 (*) 70 - 99 mg/dL  GLUCOSE, CAPILLARY     Status: Abnormal   Collection Time    09/29/13  5:21 PM      Result Value Ref Range    Glucose-Capillary 137 (*) 70 - 99 mg/dL  GLUCOSE, CAPILLARY     Status: Abnormal   Collection Time    09/29/13  9:40 PM      Result Value Ref Range   Glucose-Capillary 157 (*) 70 - 99 mg/dL   Comment 1 Notify RN     Comment 2 Documented in Chart    COMPREHENSIVE METABOLIC PANEL     Status: Abnormal   Collection Time    09/30/13  5:30 AM      Result Value Ref Range   Sodium 136 (*) 137 - 147 mEq/L   Potassium 4.1  3.7 - 5.3 mEq/L   Chloride 101  96 - 112 mEq/L   CO2 23  19 - 32 mEq/L   Glucose, Bld 252 (*) 70 - 99 mg/dL   BUN 30 (*) 6 - 23 mg/dL   Creatinine, Ser 1.96 (*) 0.50 - 1.10 mg/dL   Calcium 7.7 (*) 8.4 - 10.5 mg/dL   Total Protein 6.1  6.0 - 8.3 g/dL   Albumin 2.5 (*) 3.5 - 5.2 g/dL   AST 11  0 - 37 U/L   ALT 8  0 - 35 U/L   Alkaline Phosphatase 57  39 - 117 U/L   Total Bilirubin <0.2 (*) 0.3 - 1.2 mg/dL   GFR calc non Af Amer 24 (*) >90 mL/min   GFR calc Af Amer 28 (*) >90 mL/min   Comment: (NOTE)     The eGFR has been calculated using the CKD EPI equation.     This calculation has not been validated in all clinical situations.     eGFR's persistently <90 mL/min signify possible Chronic Kidney     Disease.   Anion gap 12  5 - 15  GLUCOSE, CAPILLARY  Status: Abnormal   Collection Time    09/30/13  7:42 AM      Result Value Ref Range   Glucose-Capillary 230 (*) 70 - 99 mg/dL    Imaging / Studies: No results found.  Medications / Allergies:  Scheduled Meds: . amLODipine  10 mg Oral Daily  . cefTRIAXone (ROCEPHIN)  IV  1 g Intravenous QHS  . cloNIDine  0.2 mg Oral TID  . enoxaparin (LOVENOX) injection  30 mg Subcutaneous Daily  . insulin aspart  0-15 Units Subcutaneous TID WC  . insulin detemir  14 Units Subcutaneous Daily  . metronidazole  500 mg Intravenous Q8H   Continuous Infusions:  PRN Meds:.morphine injection, ondansetron (ZOFRAN) IV, ondansetron  Antibiotics: Anti-infectives   Start     Dose/Rate Route Frequency Ordered Stop   09/28/13 0200   metroNIDAZOLE (FLAGYL) IVPB 500 mg     500 mg 100 mL/hr over 60 Minutes Intravenous Every 8 hours 09/28/13 0022     09/28/13 0200  cefTRIAXone (ROCEPHIN) 1 g in dextrose 5 % 50 mL IVPB     1 g 100 mL/hr over 30 Minutes Intravenous Daily at bedtime 09/28/13 0123     09/27/13 1715  piperacillin-tazobactam (ZOSYN) IVPB 3.375 g     3.375 g 12.5 mL/hr over 240 Minutes Intravenous  Once 09/27/13 1703 09/27/13 2203        Assessment/Plan POD#3 s/p lap converted to open appy for perforated appendicitis---Dr. [name] -continue FL diet, await bowel function -SCD/lovenox -IS(5107ml), add flutter valve -mobilize, up to chair, PT eval -rocephin/flagyl -VAC change MWF -repeat labs in AM Urinary retention -start flomax, DC foley tomorrow HTN-stable, clonidine DM-SSI, levemir increase to 15u at HS CKD-stable, monitor BMP dCHF-euvolemic, will consider resuming lasix  Erby Pian, ANP-BC [facility] Pager [phone](7A-4:30P) For consults and floor pages call [phone](7A-4:30P)  09/30/2013 8:28 AM

## 2013-09-30 NOTE — Progress Notes (Signed)
I have seen and examined the patient and agree with the assessment and plans.  Chaise Mahabir A. Marica Trentham  MD, FACS  

## 2013-09-30 NOTE — Evaluation (Signed)
Physical Therapy Evaluation Patient Details Name: KARIANN BESECKER MRN: [id number] DOB: 04-15-1940 Today's Date: 09/30/2013   History of Present Illness  Adm 09/27/13 with abd pain and underwent open appendectomy due to rupture. PMHx- CHF, osteoporosis, HTN, DM  Clinical Impression  Patient evaluated by Physical Therapy with no further acute PT needs identified. All education has been completed and the patient has no further questions. PT is signing off. Thank you for this referral.     Follow Up Recommendations No PT follow up    Equipment Recommendations  None recommended by PT    Recommendations for Other Services       Precautions / Restrictions        Mobility  Bed Mobility Overal bed mobility: Modified Independent             General bed mobility comments: educated pt on sit to sidelying to decr abd strain, however pt prefers sit to supine and able to perform without assist  Transfers Overall transfer level: Needs assistance Equipment used: Rolling walker (2 wheeled) Transfers: Sit to/from Stand Sit to Stand: Min assist         General transfer comment: from toilet due to pain  Ambulation/Gait Ambulation/Gait assistance: Min guard Ambulation Distance (Feet): 200 Feet Assistive device: Rolling walker (2 wheeled) Gait Pattern/deviations: Step-through pattern;Decreased stride length;Trunk flexed        Stairs            Wheelchair Mobility    Modified [place] (Stroke Patients Only)       Balance Overall balance assessment: No apparent balance deficits (not formally assessed)                                           Pertinent Vitals/Pain Pain Assessment: 0-10 Pain Score: 3  Pain Location: abd Pain Descriptors / Indicators: Sharp Pain Intervention(s): Monitored during session;Repositioned;Patient requesting pain meds-RN notified  SaO2 90% on RA with walking 92% on RA at rest; RN made aware and OK'd leaving [place] O2 off (pt  had removed prior to PT arrival)    Home Living Family/patient expects to be discharged to:: Private residence Living Arrangements: Children;Other relatives Available Help at Discharge: Family;Available 24 hours/day Type of Home: Mobile home Home Access: Stairs to enter Entrance Stairs-Rails: None Entrance Stairs-Number of Steps: 1 Home Layout: One level Home Equipment: Walker - 2 wheels;Bedside commode      Prior Function Level of Independence: Independent         Comments: Was not using DME PTA     Hand Dominance        Extremity/Trunk Assessment   Upper Extremity Assessment: Overall WFL for tasks assessed           Lower Extremity Assessment: Overall WFL for tasks assessed      Cervical / Trunk Assessment: Normal  Communication   Communication: No difficulties  Cognition Arousal/Alertness: Awake/alert Behavior During Therapy: WFL for tasks assessed/performed Overall Cognitive Status: Within Functional Limits for tasks assessed                      General Comments General comments (skin integrity, edema, etc.): Pt reports she used DME after prior hospitalization    Exercises        Assessment/Plan    PT Assessment Patent does not need any further PT services  PT Diagnosis  PT Problem List    PT Treatment Interventions     PT Goals (Current goals can be found in the Care Plan section) Acute Rehab PT Goals PT Goal Formulation: No goals set, d/c therapy    Frequency     Barriers to discharge        Co-evaluation               End of Session   Activity Tolerance: Patient tolerated treatment well Patient left: in bed;with call bell/phone within reach Nurse Communication: Mobility status         Time: [id number] PT Time Calculation (min): 17 min   Charges:   PT Evaluation $Initial PT Evaluation Tier I: 1 Procedure     PT G Codes:          Lovinia Snare 2013-10-23, 1:53 PM 10/23/13 Pager [phone]

## 2013-09-30 NOTE — Care Management Note (Signed)
  Page 1 of 1   10/02/2013     9:58:46 AM CARE MANAGEMENT NOTE 10/02/2013  Patient:  Laura Best, Laura Best   Account Number:  [id number]  Date Initiated:  09/30/2013  Documentation initiated by:  [name]  Subjective/Objective Assessment:     Action/Plan:   Anticipated DC Date:  10/03/2013   Anticipated DC Plan:  [place]         Choice offered to / List presented to:  C-1 Patient        [place] arranged  HH-1 RN      [place].   Status of service:  Completed, signed off Medicare Important Message given?  YES (If response is "NO", the following Medicare IM given date fields will be blank) Date Medicare IM given:  09/30/2013 Medicare IM given by:  [name] Date Additional Medicare IM given:  10/02/2013 Additional Medicare IM given by:  [name]  Discharge Disposition:    Per UR Regulation:  Reviewed for med. necessity/level of care/duration of stay  If discussed at [place] of Stay Meetings, dates discussed:   10/02/2013    Comments:  09-30-13 Discussed discharge planning with [facility] Billings and [facility] with [facility] , at present [facility] not needed for home . [name] RN BSN [phone]

## 2013-10-01 DIAGNOSIS — R109 Unspecified abdominal pain: Secondary | ICD-10-CM

## 2013-10-01 LAB — GLUCOSE, CAPILLARY
GLUCOSE-CAPILLARY: 152 mg/dL — AB (ref 70–99)
Glucose-Capillary: 146 mg/dL — ABNORMAL HIGH (ref 70–99)
Glucose-Capillary: 96 mg/dL (ref 70–99)
Glucose-Capillary: 135 mg/dL — ABNORMAL HIGH (ref 70–99)

## 2013-10-01 LAB — BASIC METABOLIC PANEL
Anion gap: 13 (ref 5–15)
BUN: 33 mg/dL — ABNORMAL HIGH (ref 6–23)
CALCIUM: 7.6 mg/dL — AB (ref 8.4–10.5)
CO2: 23 mEq/L (ref 19–32)
Chloride: 101 mEq/L (ref 96–112)
Creatinine, Ser: 1.86 mg/dL — ABNORMAL HIGH (ref 0.50–1.10)
GFR calc Af Amer: 30 mL/min — ABNORMAL LOW (ref >90)
GFR calc non Af Amer: 26 mL/min — ABNORMAL LOW (ref >90)
GLUCOSE: 158 mg/dL — AB (ref 70–99)
Potassium: 3.9 mEq/L (ref 3.7–5.3)
SODIUM: 137 meq/L (ref 137–147)

## 2013-10-01 LAB — CBC
HEMATOCRIT: 26 % — AB (ref 36.0–46.0)
HEMOGLOBIN: 8.3 g/dL — AB (ref 12.0–15.0)
MCH: 27.1 pg (ref 26.0–34.0)
MCHC: 31.9 g/dL (ref 30.0–36.0)
MCV: 85 fL (ref 78.0–100.0)
Platelets: 179 10*3/uL (ref 150–400)
RBC: 3.06 MIL/uL — ABNORMAL LOW (ref 3.87–5.11)
RDW: 15.2 % (ref 11.5–15.5)
WBC: 6.2 10*3/uL (ref 4.0–10.5)

## 2013-10-01 MED ORDER — BISACODYL 10 MG RE SUPP
10.0000 mg | Freq: Once | RECTAL | Status: AC
  Administered 2013-10-01: 10 mg via RECTAL
  Filled 2013-10-01: qty 1

## 2013-10-01 MED ORDER — ACETAMINOPHEN 325 MG PO TABS: 325.0000 mg | ORAL_TABLET | Freq: Four times a day (QID) | ORAL | Status: DC | PRN

## 2013-10-01 MED ORDER — POLYETHYLENE GLYCOL 3350 17 G PO PACK
17.0000 g | PACK | Freq: Every day | ORAL | Status: DC
  Administered 2013-10-01 – 2013-10-03 (×3): 17 g via ORAL
  Filled 2013-10-01 (×4): qty 1

## 2013-10-01 NOTE — Consult Note (Addendum)
WOC wound follow-up consult note Reason for Consult: Assessed abd wound with [facility] team at the bedside after Vac dressing was removed. Wound type: Full thickness post-op wound to midline abd Measurement: 10X3X.2cm Wound bed: 100% beefy red Drainage (amount, consistency, odor) Small amt pink drainage, no odor Periwound: Intact skin surrounding Dressing procedure/placement/frequency: Vac order D/Ced by [facility] and moist gauze dressing applied; surgical team will be following for assessment and plan of care. Please re-consult if further assistance is needed.  Thank-you,  Julien Girt MSN, [place], [place], [place], [place]

## 2013-10-01 NOTE — Progress Notes (Signed)
I have seen and examined the patient and agree with the assessment and plans. Still with slowly resolving ileus  Ramie Erman A. Ninfa Linden  MD, FACS

## 2013-10-01 NOTE — Progress Notes (Signed)
Patient ID: Laura Best  female  [id number]    DOB: [birth year]    DOA: 09/27/2013  PCP: [name], PA-C  73 yo F with a history of HTN, Diastolic CHF, DM2, Hyperlipidemia, and Stage III CKD who presented to the ED with complaints of LLQ ABD Pain with nausea/vomiting and was found on CT scan to have appendicitis with peritonitis on CT scan. Dr. [name] took her to the OR for an appendectomy. [facility] were consulted for medical management.   Assessment/Plan: Principal Problem:   Acute appendicitis with peritoneal abscess - Postop day #4, s/p open appendectomy for perforated appendicitis -Management per surgery, added MiraLax and Dulcolax  - On IV Flagyl Rocephin  Active Problems:   Hypertension - Continue Norvasc, clonidine    Diabetes type 2, uncontrolled - Hemoglobin A1c 7.6, continue sliding scale insulin   Chronic diastolic CHF TTE Dec 123456 w/ EF 55% and grade 2 DD - euvolemic at present  -continue lasix, monitor Cr, d/c pioglitozone; allergic to ACE;   CKD stage III (GFR 30-59 ml/min); resume home diuretics; monitor renal function   Pulmonary hypertension / Mild R heart failure PA pressure via TTE Dec 2014 11mm Hg - Continue Lasix   COPD no wheezing on exam; cont inhalers; Quit smoking 4 years ago  DVT Prophylaxis:  Code Status:  Family Communication:  Disposition: Per surgery  Consultants:  surgery  Procedures:    Antibiotics:  rocephin  flagyl  Subjective: Patient seen and examined, no BM, states that had significant nausea and vomiting last night  Objective: Weight change:   Intake/Output Summary (Last 24 hours) at 10/01/13 0954 Last data filed at 10/01/13 0635  Gross per 24 hour  Intake    270 ml  Output    475 ml  Net   -205 ml   Blood pressure 121/41, pulse 55, temperature 98.9 F (37.2 C), temperature source Oral, resp. rate 18, height 5\' 3"  (1.6 m), weight 74.3 kg (163 lb 12.8 oz), SpO2 93.00%.  Physical  Exam: General: Alert and awake, oriented x3, not in any acute distress. CVS: S1-S2 clear, no murmur rubs or gallops Chest: clear to auscultation bilaterally, no wheezing, rales or rhonchi Abdomen: soft, mildly   distended,tender,  Extremities: no cyanosis, clubbing or edema noted bilaterally Neuro: Cranial nerves II-XII intact, no focal neurological deficits  Lab Results: Basic Metabolic Panel:  Recent Labs Lab 09/30/13 0530 10/01/13 0520  NA 136* 137  K 4.1 3.9  CL 101 101  CO2 23 23  GLUCOSE 252* 158*  BUN 30* 33*  CREATININE 1.96* 1.86*  CALCIUM 7.7* 7.6*   Liver Function Tests:  Recent Labs Lab 09/27/13 1733 09/30/13 0530  AST 13 11  ALT 10 8  ALKPHOS 58 57  BILITOT 0.4 <0.2*  PROT 7.2 6.1  ALBUMIN 3.5 2.5*    Recent Labs Lab 09/27/13 1733  LIPASE 16   No results found for this basename: AMMONIA,  in the last 168 hours CBC:  Recent Labs Lab 09/27/13 1733  09/29/13 0208 10/01/13 0520  WBC 12.0*  < > 8.6 6.2  NEUTROABS 10.6*  --   --   --   HGB 9.9*  < > 8.2* 8.3*  HCT 29.9*  < > 25.5* 26.0*  MCV 84.7  < > 89.5 85.0  PLT 188  < > 159 179  < > = values in this interval not displayed. Cardiac Enzymes: No results found for this basename: CKTOTAL, CKMB, CKMBINDEX, TROPONINI,  in the  last 168 hours BNP: No components found with this basename: POCBNP,  CBG:  Recent Labs Lab 09/30/13 0742 09/30/13 1147 09/30/13 1640 09/30/13 2208 10/01/13 0754  GLUCAP 230* 193* 179* 108* 146*     Micro Results: Recent Results (from the past 240 hour(s))  CULTURE, BLOOD (ROUTINE X 2)     Status: None   Collection Time    09/27/13  5:30 PM      Result Value Ref Range Status   Specimen Description BLOOD LEFT ARM   Final   Special Requests BOTTLES DRAWN AEROBIC AND ANAEROBIC BLUE 10 RED 5   Final   Culture  Setup Time     Final   Value: 09/28/2013 02:09     Performed at [facility]   Culture     Final   Value:        BLOOD CULTURE RECEIVED NO  GROWTH TO DATE CULTURE WILL BE HELD FOR 5 DAYS BEFORE ISSUING A FINAL NEGATIVE REPORT     Performed at [facility]   Report Status PENDING   Incomplete  CULTURE, BLOOD (ROUTINE X 2)     Status: None   Collection Time    09/27/13  5:37 PM      Result Value Ref Range Status   Specimen Description BLOOD LEFT HAND   Final   Special Requests BOTTLES DRAWN AEROBIC ONLY 10 CC   Final   Culture  Setup Time     Final   Value: 09/28/2013 02:09     Performed at [facility]   Culture     Final   Value:        BLOOD CULTURE RECEIVED NO GROWTH TO DATE CULTURE WILL BE HELD FOR 5 DAYS BEFORE ISSUING A FINAL NEGATIVE REPORT     Performed at [facility]   Report Status PENDING   Incomplete  MRSA PCR SCREENING     Status: None   Collection Time    09/28/13  1:32 AM      Result Value Ref Range Status   MRSA by PCR NEGATIVE  NEGATIVE Final   Comment:            The GeneXpert MRSA Assay (FDA     approved for NASAL specimens     only), is one component of a     comprehensive MRSA colonization     surveillance program. It is not     intended to diagnose MRSA     infection nor to guide or     monitor treatment for     MRSA infections.    Studies/Results: Ct Abdomen Pelvis Wo Contrast  09/27/2013   CLINICAL DATA:  Left-sided abdominal pain with nausea and vomiting. History of prior periappendiceal abscess post drainage July 2014.  EXAM: CT ABDOMEN AND PELVIS WITHOUT CONTRAST  TECHNIQUE: Multidetector CT imaging of the abdomen and pelvis was performed following the standard protocol without IV contrast.  COMPARISON:  12/31/2012, 08/01/2012 and 07/19/2012  FINDINGS: The lung bases demonstrate minimal atelectasis over the posterior left lower lobe. There is minimal focal calcification along the right side of the pericardium unchanged.  Abdominal images demonstrate a normal liver, spleen, pancreas, gallbladder and adrenal glands. Kidneys are normal in size without hydronephrosis or  nephrolithiasis. There is moderate calcified plaque involving the abdominal aorta and iliac vessels.  The cecum is located just left of midline in the mid abdomen. There is a blind-ending tubular structure extending from the cecum measuring 1 cm  diameter with adjacent inflammatory change likely representing in inflamed appendix. No evidence of perforation or abscess.  There is mild diverticulosis of the colon. Remaining pelvic structures are unchanged. Mild spondylosis of the spine.  IMPRESSION: Findings compatible with mild acute appendicitis. Note that the cecum is located in the mid abdomen just left of midline. No evidence of perforation or abscess.  Mild diverticulosis of the colon.  These results were called by telephone at the time of interpretation on 09/27/2013 at 7:33 pm to Dr. Doy Mince, who verbally acknowledged these results.   Electronically Signed   By: Marin Olp M.D.   On: 09/27/2013 19:33   Dg Chest 2 View  09/27/2013   CLINICAL DATA:  Acute appendicitis.  Preop respiratory exam.  EXAM: CHEST  2 VIEW  COMPARISON:  01/19/2013  FINDINGS: Moderate cardiomegaly and pulmonary vascular congestion again noted. Pulmonary hyperinflation again demonstrated. No evidence of acute pulmonary edema or airspace disease. No evidence of pleural effusion. Prior median sternotomy again noted.  IMPRESSION: Stable cardiomegaly, pulmonary vascular congestion, and probable COPD. No acute findings.   Electronically Signed   By: Earle Gell M.D.   On: 09/27/2013 21:29   Dg Abd Portable 1v  09/28/2013   CLINICAL DATA:  Intraoperative examination for instrument count.  EXAM: PORTABLE ABDOMEN - 1 VIEW  COMPARISON:  CT abdomen and pelvis 09/27/2013.  FINDINGS: No radiopaque surgical foreign bodies are demonstrated in the visualized field of view. Vascular calcifications. Scattered stool in the colon. No small or large bowel distention. Enteric tube demonstrated in the left upper quadrant.  IMPRESSION: Negative for  postoperative purposes.  Results were telephoned to the operating room at 2355 hr on 09/27/2013.   Electronically Signed   By: Lucienne Capers M.D.   On: 09/28/2013 00:03    Medications: Scheduled Meds: . amLODipine  10 mg Oral Daily  . bisacodyl  10 mg Rectal Once  . cefTRIAXone (ROCEPHIN)  IV  1 g Intravenous QHS  . cloNIDine  0.2 mg Oral TID  . docusate sodium  100 mg Oral BID  . enoxaparin (LOVENOX) injection  30 mg Subcutaneous Daily  . furosemide  40 mg Oral Daily  . insulin aspart  0-15 Units Subcutaneous TID WC  . insulin detemir  18 Units Subcutaneous Daily  . metronidazole  500 mg Intravenous Q8H  . polyethylene glycol  17 g Oral Daily  . tamsulosin  0.4 mg Oral Daily      LOS: 4 days   RAI,RIPUDEEP M.D. [facility] 10/01/2013, 9:54 AM Pager: [phone]  If 7PM-7AM, please contact night-coverage [email or web address] Password [email or web address]  **Disclaimer: This note was dictated with voice recognition software. Similar sounding words can inadvertently be transcribed and this note may contain transcription errors which may not have been corrected upon publication of note.**

## 2013-10-01 NOTE — Progress Notes (Signed)
Patient ID: Laura Best, female   DOB: [birth year], 73 y.o.   MRN: [id number]     [facility]      [address] [place], [place] [address]    Phone: [phone] FAX: [phone]     Subjective: Passing flatus, no BM.  Vomited last night.  Afebrile.  VSS.  Foley replaced.   Objective:  Vital signs:  Filed Vitals:   09/30/13 1410 09/30/13 1610 09/30/13 2213 10/01/13 0633  BP: 136/52 112/44 146/51 121/41  Pulse: 66 56 71 55  Temp: 97.8 F (36.6 C)  99 F (37.2 C) 98.9 F (37.2 C)  TempSrc: Oral  Oral Oral  Resp: '19  19 18  ' Height:      Weight:      SpO2: 94%  92% 93%    Last BM Date: 09/27/13  Intake/Output   Yesterday:  09/08 0701 - 09/09 0700 In: 370 [P.O.:120; IV Piggyback:250] Out: 475 [Urine:475] This shift: I/O last 3 completed shifts: In: 640 [P.O.:240; IV Piggyback:400] Out: 800 [Urine:800]   Physical Exam:  General: Pt awake/alert/oriented x4 in no acute distress  Chest: cta. No chest wall pain w good excursion  CV: Pulses intact. Regular rhythm  MS: Normal AROM mjr joints. No obvious deformity  Abdomen: Soft. Nondistended. Appropriately tender. Midline wound, VAC, lap sites are c/d/i. No evidence of peritonitis. No incarcerated hernias.  Ext: SCDs BLE. No mjr edema. No cyanosis  Skin: No petechiae / purpura   Problem List:   Active Problems:   Acute appendicitis with peritoneal abscess    Results:   Labs: Results for orders placed during the hospital encounter of 09/27/13 (from the past 48 hour(s))  GLUCOSE, CAPILLARY     Status: Abnormal   Collection Time    09/29/13 11:56 AM      Result Value Ref Range   Glucose-Capillary 148 (*) 70 - 99 mg/dL  GLUCOSE, CAPILLARY     Status: Abnormal   Collection Time    09/29/13  5:21 PM      Result Value Ref Range   Glucose-Capillary 137 (*) 70 - 99 mg/dL  GLUCOSE, CAPILLARY     Status: Abnormal   Collection Time    09/29/13  9:40 PM      Result  Value Ref Range   Glucose-Capillary 157 (*) 70 - 99 mg/dL   Comment 1 Notify RN     Comment 2 Documented in Chart    COMPREHENSIVE METABOLIC PANEL     Status: Abnormal   Collection Time    09/30/13  5:30 AM      Result Value Ref Range   Sodium 136 (*) 137 - 147 mEq/L   Potassium 4.1  3.7 - 5.3 mEq/L   Chloride 101  96 - 112 mEq/L   CO2 23  19 - 32 mEq/L   Glucose, Bld 252 (*) 70 - 99 mg/dL   BUN 30 (*) 6 - 23 mg/dL   Creatinine, Ser 1.96 (*) 0.50 - 1.10 mg/dL   Calcium 7.7 (*) 8.4 - 10.5 mg/dL   Total Protein 6.1  6.0 - 8.3 g/dL   Albumin 2.5 (*) 3.5 - 5.2 g/dL   AST 11  0 - 37 U/L   ALT 8  0 - 35 U/L   Alkaline Phosphatase 57  39 - 117 U/L   Total Bilirubin <0.2 (*) 0.3 - 1.2 mg/dL   GFR calc non Af Amer 24 (*) >90 mL/min   GFR  calc Af Amer 28 (*) >90 mL/min   Comment: (NOTE)     The eGFR has been calculated using the CKD EPI equation.     This calculation has not been validated in all clinical situations.     eGFR's persistently <90 mL/min signify possible Chronic Kidney     Disease.   Anion gap 12  5 - 15  GLUCOSE, CAPILLARY     Status: Abnormal   Collection Time    09/30/13  7:42 AM      Result Value Ref Range   Glucose-Capillary 230 (*) 70 - 99 mg/dL  GLUCOSE, CAPILLARY     Status: Abnormal   Collection Time    09/30/13 11:47 AM      Result Value Ref Range   Glucose-Capillary 193 (*) 70 - 99 mg/dL   Comment 1 Notify RN    GLUCOSE, CAPILLARY     Status: Abnormal   Collection Time    09/30/13  4:40 PM      Result Value Ref Range   Glucose-Capillary 179 (*) 70 - 99 mg/dL   Comment 1 Notify RN    GLUCOSE, CAPILLARY     Status: Abnormal   Collection Time    09/30/13 10:08 PM      Result Value Ref Range   Glucose-Capillary 108 (*) 70 - 99 mg/dL   Comment 1 Notify RN     Comment 2 Documented in Chart    CBC     Status: Abnormal   Collection Time    10/01/13  5:20 AM      Result Value Ref Range   WBC 6.2  4.0 - 10.5 K/uL   RBC 3.06 (*) 3.87 - 5.11 MIL/uL    Hemoglobin 8.3 (*) 12.0 - 15.0 g/dL   HCT 26.0 (*) 36.0 - 46.0 %   MCV 85.0  78.0 - 100.0 fL   MCH 27.1  26.0 - 34.0 pg   MCHC 31.9  30.0 - 36.0 g/dL   RDW 15.2  11.5 - 15.5 %   Platelets 179  150 - 400 K/uL  BASIC METABOLIC PANEL     Status: Abnormal   Collection Time    10/01/13  5:20 AM      Result Value Ref Range   Sodium 137  137 - 147 mEq/L   Potassium 3.9  3.7 - 5.3 mEq/L   Chloride 101  96 - 112 mEq/L   CO2 23  19 - 32 mEq/L   Glucose, Bld 158 (*) 70 - 99 mg/dL   BUN 33 (*) 6 - 23 mg/dL   Creatinine, Ser 1.86 (*) 0.50 - 1.10 mg/dL   Calcium 7.6 (*) 8.4 - 10.5 mg/dL   GFR calc non Af Amer 26 (*) >90 mL/min   GFR calc Af Amer 30 (*) >90 mL/min   Comment: (NOTE)     The eGFR has been calculated using the CKD EPI equation.     This calculation has not been validated in all clinical situations.     eGFR's persistently <90 mL/min signify possible Chronic Kidney     Disease.   Anion gap 13  5 - 15  GLUCOSE, CAPILLARY     Status: Abnormal   Collection Time    10/01/13  7:54 AM      Result Value Ref Range   Glucose-Capillary 146 (*) 70 - 99 mg/dL    Imaging / Studies: No results found.  Medications / Allergies:  Scheduled Meds: . amLODipine  10 mg  Oral Daily  . cefTRIAXone (ROCEPHIN)  IV  1 g Intravenous QHS  . cloNIDine  0.2 mg Oral TID  . docusate sodium  100 mg Oral BID  . enoxaparin (LOVENOX) injection  30 mg Subcutaneous Daily  . furosemide  40 mg Oral Daily  . insulin aspart  0-15 Units Subcutaneous TID WC  . insulin detemir  18 Units Subcutaneous Daily  . metronidazole  500 mg Intravenous Q8H  . tamsulosin  0.4 mg Oral Daily   Continuous Infusions:  PRN Meds:.acetaminophen, albuterol, morphine injection, ondansetron (ZOFRAN) IV, ondansetron, traMADol  Antibiotics: Anti-infectives   Start     Dose/Rate Route Frequency Ordered Stop   09/28/13 0200  metroNIDAZOLE (FLAGYL) IVPB 500 mg     500 mg 100 mL/hr over 60 Minutes Intravenous Every 8 hours 09/28/13  0022     09/28/13 0200  cefTRIAXone (ROCEPHIN) 1 g in dextrose 5 % 50 mL IVPB     1 g 100 mL/hr over 30 Minutes Intravenous Daily at bedtime 09/28/13 0123     09/27/13 1715  piperacillin-tazobactam (ZOSYN) IVPB 3.375 g     3.375 g 12.5 mL/hr over 240 Minutes Intravenous  Once 09/27/13 1703 09/27/13 2203       Assessment/Plan  POD#4 s/p lap converted to open appy for perforated appendicitis---Dr. [name]  -continue FL diet, await bowel function  -start miralax, give dulcolax -SCD/lovenox  -IS(557m), TEACH flutter valve  -mobilize, up to chair, PT eval  -rocephin/flagyl  -VAC change MWF  Urinary retention  -start flomax, DC foley tomorrow  HTN-stable, clonidine  DM-SSI, levemir CKD-stable, monitor BMP  dCHF-euvolemic, lasix resumed  Dispo-home with son and daughter in law, PT-no follow up  EErby Pian ANP-BC [facility] Pager [phone] For consults and floor pages call [phone](7A-4:30P)  10/01/2013 8:14 AM

## 2013-10-02 ENCOUNTER — Ambulatory Visit: Payer: Medicare HMO | Admitting: Physician Assistant

## 2013-10-02 LAB — GLUCOSE, CAPILLARY
GLUCOSE-CAPILLARY: 94 mg/dL (ref 70–99)
GLUCOSE-CAPILLARY: 99 mg/dL (ref 70–99)
Glucose-Capillary: 139 mg/dL — ABNORMAL HIGH (ref 70–99)
Glucose-Capillary: 93 mg/dL (ref 70–99)

## 2013-10-02 MED ORDER — INFLUENZA VAC SPLIT QUAD 0.5 ML IM SUSY
0.5000 mL | PREFILLED_SYRINGE | INTRAMUSCULAR | Status: AC
  Administered 2013-10-03: 0.5 mL via INTRAMUSCULAR
  Filled 2013-10-02: qty 0.5

## 2013-10-02 MED ORDER — CIPROFLOXACIN HCL 500 MG PO TABS
500.0000 mg | ORAL_TABLET | Freq: Every day | ORAL | Status: DC
  Administered 2013-10-02: 500 mg via ORAL
  Filled 2013-10-02 (×2): qty 1

## 2013-10-02 MED ORDER — METRONIDAZOLE 500 MG PO TABS
500.0000 mg | ORAL_TABLET | Freq: Three times a day (TID) | ORAL | Status: DC
  Administered 2013-10-02 – 2013-10-03 (×3): 500 mg via ORAL
  Filled 2013-10-02 (×6): qty 1

## 2013-10-02 NOTE — Discharge Instructions (Signed)

## 2013-10-02 NOTE — Discharge Summary (Signed)
Physician Discharge Summary  Laura Best [id number] DOB: 1940-01-30 DOA: 09/27/2013  PCP: [name], PA-C  Consultation: internal medicine   WOC  Admit date: 09/27/2013 Discharge date: 10/02/2013  Recommendations for Outpatient Follow-up:   Follow-up Information   Follow up with Maia Petties., MD On 10/20/2013. (arrive by 8:30AM for a 8:50AM appt for a post op check.  please be sure to arrive early to complete paperwork. )    Specialty:  General Surgery   Contact information:   [place] [address] [phone]       Follow up with [facility] BETH, PA-C.   Specialty:  Physician Assistant   Contact information:   [place] [address] [phone]      Discharge Diagnoses:  1. Perforated appendicitis 2. Urinary retention 3. AKI 4. HTN 5. DM 6. dCHF   Surgical Procedure: Lap converted to open appy for perforated appendicitis---Dr. [name]    Discharge Condition: stable Disposition: home with children   Diet recommendation: low sodium  Filed Weights   09/27/13 1646 09/27/13 2132 09/28/13 0400  Weight: 165 lb (74.844 kg) 164 lb (74.39 kg) 163 lb 12.8 oz (74.3 kg)     Filed Vitals:   10/02/13 1009  BP: 140/45  Pulse: 63  Temp: 98 F (36.7 C)  Resp: 17     Hospital Course:  Laura Best presented to [facility] with LLQ abdominal pain, nausea and vomiting.  She had perforated appendicitis in 2014 which was managed without surgical intervention.  Her work up showed left sided appendicitis.  She underwent the procedure listed above which she tolerated well and was transferred to SDU.  She was continued on antibiotics.  She was mobilized and progressed.  A VAC was applied.  Her chronic medical problems remained stable, home medication were resumed.  She developed urinary retention, flomax was started and foley was removed, she did not have any additional issues.  Physical therapy consulted given her age, however,  she did not require any further assistance.  She remained afebrile.  VSS.  sCr remained and improved.  On HD#6  the patient was tolerating a diet, having BMs, pain well controlled, stable VS and therefore felt stable for discharge.  I have made an appointment on her behalf and she is aware.  Medication risks, benefits and therapeutic alternatives were reviewed with the patient.  She verbalizes understanding. She was encouraged to call with questions or concerns.    Physical Exam:  General: Pt awake/alert/oriented x4 in no acute distress  Chest: cta. No chest wall pain w good excursion  CV: Pulses intact. Regular rhythm  MS: Normal AROM mjr joints. No obvious deformity  Abdomen: Soft. Nondistended. Appropriately tender. Midline wound, c/d/i. No evidence of peritonitis. No incarcerated hernias.  Ext: SCDs BLE. No mjr edema. No cyanosis  Skin: No petechiae / purpura  Discharge Instructions     Medication List         acetaminophen 500 MG tablet  Commonly known as:  TYLENOL  Take 500 mg by mouth every 6 (six) hours as needed for pain.     alendronate 70 MG tablet  Commonly known as:  FOSAMAX  Take 70 mg by mouth once a week. Take with a full glass of water on an empty stomach.     amLODipine 10 MG tablet  Commonly known as:  NORVASC  Take 10 mg by mouth daily.     aspirin EC 81 MG tablet  Take 81 mg by mouth daily.     cholecalciferol 1000 UNITS tablet  Commonly known as:  VITAMIN D  Take 4,000 Units by mouth daily.     ciprofloxacin 500 MG tablet  Commonly known as:  CIPRO  Take 1 tablet (500 mg total) by mouth daily.     cloNIDine 0.2 MG tablet  Commonly known as:  CATAPRES  Take 0.2 mg by mouth 2 (two) times daily.     DSS 100 MG Caps  Take 100 mg by mouth 2 (two) times daily.     furosemide 40 MG tablet  Commonly known as:  LASIX  Take 40 mg by mouth daily.     Insulin Detemir 100 UNIT/ML Pen  Commonly known as:  LEVEMIR  Inject 18 Units into the skin every  morning.     metroNIDAZOLE 500 MG tablet  Commonly known as:  FLAGYL  Take 1 tablet (500 mg total) by mouth every 8 (eight) hours.     pioglitazone 30 MG tablet  Commonly known as:  ACTOS  Take 30 mg by mouth daily.     simvastatin 20 MG tablet  Commonly known as:  ZOCOR  Take 20 mg by mouth at bedtime.     tamsulosin 0.4 MG Caps capsule  Commonly known as:  FLOMAX  Take 1 capsule (0.4 mg total) by mouth daily.     traMADol 50 MG tablet  Commonly known as:  ULTRAM  Take 1 tablet (50 mg total) by mouth every 6 (six) hours as needed for moderate pain or severe pain.           Follow-up Information   Follow up with Maia Petties., MD On 10/20/2013. (arrive by 8:30AM for a 8:50AM appt for a post op check.  please be sure to arrive early to complete paperwork. )    Specialty:  General Surgery   Contact information:   [place] [address] [phone]       Follow up with [facility] BETH, PA-C.   Specialty:  Physician Assistant   Contact information:   [place]  [address] [phone]        The results of significant diagnostics from this hospitalization (including imaging, microbiology, ancillary and laboratory) are listed below for reference.    Significant Diagnostic Studies: Ct Abdomen Pelvis Wo Contrast  09/27/2013   CLINICAL DATA:  Left-sided abdominal pain with nausea and vomiting. History of prior periappendiceal abscess post drainage July 2014.  EXAM: CT ABDOMEN AND PELVIS WITHOUT CONTRAST  TECHNIQUE: Multidetector CT imaging of the abdomen and pelvis was performed following the standard protocol without IV contrast.  COMPARISON:  12/31/2012, 08/01/2012 and 07/19/2012  FINDINGS: The lung bases demonstrate minimal atelectasis over the posterior left lower lobe. There is minimal focal calcification along the right side of the pericardium unchanged.  Abdominal images demonstrate a normal liver, spleen, pancreas,  gallbladder and adrenal glands. Kidneys are normal in size without hydronephrosis or nephrolithiasis. There is moderate calcified plaque involving the abdominal aorta and iliac vessels.  The cecum is located just left of midline in the mid abdomen. There is a blind-ending tubular structure extending from the cecum measuring 1 cm diameter with adjacent inflammatory change likely representing in inflamed appendix. No evidence of perforation or abscess.  There is mild diverticulosis of the colon. Remaining pelvic structures are unchanged. Mild spondylosis of the spine.  IMPRESSION: Findings compatible with mild acute appendicitis. Note that the cecum  is located in the mid abdomen just left of midline. No evidence of perforation or abscess.  Mild diverticulosis of the colon.  These results were called by telephone at the time of interpretation on 09/27/2013 at 7:33 pm to Dr. Doy Mince, who verbally acknowledged these results.   Electronically Signed   By: Marin Olp M.D.   On: 09/27/2013 19:33   Dg Chest 2 View  09/27/2013   CLINICAL DATA:  Acute appendicitis.  Preop respiratory exam.  EXAM: CHEST  2 VIEW  COMPARISON:  01/19/2013  FINDINGS: Moderate cardiomegaly and pulmonary vascular congestion again noted. Pulmonary hyperinflation again demonstrated. No evidence of acute pulmonary edema or airspace disease. No evidence of pleural effusion. Prior median sternotomy again noted.  IMPRESSION: Stable cardiomegaly, pulmonary vascular congestion, and probable COPD. No acute findings.   Electronically Signed   By: Earle Gell M.D.   On: 09/27/2013 21:29   Dg Abd Portable 1v  09/28/2013   CLINICAL DATA:  Intraoperative examination for instrument count.  EXAM: PORTABLE ABDOMEN - 1 VIEW  COMPARISON:  CT abdomen and pelvis 09/27/2013.  FINDINGS: No radiopaque surgical foreign bodies are demonstrated in the visualized field of view. Vascular calcifications. Scattered stool in the colon. No small or large bowel distention.  Enteric tube demonstrated in the left upper quadrant.  IMPRESSION: Negative for postoperative purposes.  Results were telephoned to the operating room at 2355 hr on 09/27/2013.   Electronically Signed   By: Lucienne Capers M.D.   On: 09/28/2013 00:03    Microbiology: Recent Results (from the past 240 hour(s))  CULTURE, BLOOD (ROUTINE X 2)     Status: None   Collection Time    09/27/13  5:30 PM      Result Value Ref Range Status   Specimen Description BLOOD LEFT ARM   Final   Special Requests BOTTLES DRAWN AEROBIC AND ANAEROBIC BLUE 10 RED 5   Final   Culture  Setup Time     Final   Value: 09/28/2013 02:09     Performed at [facility]   Culture     Final   Value:        BLOOD CULTURE RECEIVED NO GROWTH TO DATE CULTURE WILL BE HELD FOR 5 DAYS BEFORE ISSUING A FINAL NEGATIVE REPORT     Performed at [facility]   Report Status PENDING   Incomplete  CULTURE, BLOOD (ROUTINE X 2)     Status: None   Collection Time    09/27/13  5:37 PM      Result Value Ref Range Status   Specimen Description BLOOD LEFT HAND   Final   Special Requests BOTTLES DRAWN AEROBIC ONLY 10 CC   Final   Culture  Setup Time     Final   Value: 09/28/2013 02:09     Performed at [facility]   Culture     Final   Value:        BLOOD CULTURE RECEIVED NO GROWTH TO DATE CULTURE WILL BE HELD FOR 5 DAYS BEFORE ISSUING A FINAL NEGATIVE REPORT     Performed at [facility]   Report Status PENDING   Incomplete  MRSA PCR SCREENING     Status: None   Collection Time    09/28/13  1:32 AM      Result Value Ref Range Status   MRSA by PCR NEGATIVE  NEGATIVE Final   Comment:            The  GeneXpert MRSA Assay (FDA     approved for NASAL specimens     only), is one component of a     comprehensive MRSA colonization     surveillance program. It is not     intended to diagnose MRSA     infection nor to guide or     monitor treatment for     MRSA infections.     Labs: Basic Metabolic  Panel:  Recent Labs Lab 09/27/13 1733 09/28/13 0235 09/29/13 0208 09/30/13 0530 10/01/13 0520  NA 136* 140 137 136* 137  K 3.8 4.1 4.0 4.1 3.9  CL 99 105 104 101 101  CO2 23 24 22 23 23   GLUCOSE 235* 276* 282* 252* 158*  BUN 37* 32* 28* 30* 33*  CREATININE 1.70* 1.63* 1.77* 1.96* 1.86*  CALCIUM 9.0 8.2* 7.4* 7.7* 7.6*   Liver Function Tests:  Recent Labs Lab 09/27/13 1733 09/30/13 0530  AST 13 11  ALT 10 8  ALKPHOS 58 57  BILITOT 0.4 <0.2*  PROT 7.2 6.1  ALBUMIN 3.5 2.5*    Recent Labs Lab 09/27/13 1733  LIPASE 16   No results found for this basename: AMMONIA,  in the last 168 hours CBC:  Recent Labs Lab 09/27/13 1733 09/28/13 0235 09/29/13 0208 10/01/13 0520  WBC 12.0* 15.2* 8.6 6.2  NEUTROABS 10.6*  --   --   --   HGB 9.9* 10.0* 8.2* 8.3*  HCT 29.9* 30.7* 25.5* 26.0*  MCV 84.7 87.0 89.5 85.0  PLT 188 223 159 179   Cardiac Enzymes: No results found for this basename: CKTOTAL, CKMB, CKMBINDEX, TROPONINI,  in the last 168 hours BNP: BNP (last 3 results)  Recent Labs  01/19/13 0157  PROBNP 10409.0*   CBG:  Recent Labs Lab 10/01/13 1152 10/01/13 1656 10/01/13 2245 10/02/13 0804 10/02/13 1158  GLUCAP 152* 96 135* 94 139*    Principal Problem:   Acute appendicitis with peritoneal abscess Active Problems:   Hypertension   Diabetes type 2, uncontrolled   Time coordinating discharge: <30 mins  Signed:  Jahmeer Porche, ANP-BC

## 2013-10-02 NOTE — Progress Notes (Signed)
I have seen and examined the patient and agree with the assessment and plans.  Lilinoe Acklin A. King Pinzon  MD, FACS  

## 2013-10-02 NOTE — Progress Notes (Signed)
Patient ID: Laura Best  female  [id number]    DOB: [birth year]    DOA: 09/27/2013  PCP: [name], PA-C  73 yo F with a history of HTN, Diastolic CHF, DM2, Hyperlipidemia, and Stage III CKD who presented to the ED with complaints of LLQ ABD Pain with nausea/vomiting and was found on CT scan to have appendicitis with peritonitis on CT scan. Dr. [name] took her to the OR for an appendectomy. [facility] were consulted for medical management.   Assessment/Plan: Principal Problem:   Acute appendicitis with peritoneal abscess - Postop day #5, s/p open appendectomy for perforated appendicitis - Management per surgery - diet advanced today  Active Problems:   Hypertension- stable - Continue Norvasc, clonidine    Diabetes type 2, uncontrolled - Hemoglobin A1c 7.6, continue sliding scale insulin, resume home regimen at DC   Chronic diastolic CHF TTE Dec 123456 w/ EF 55% and grade 2 DD - euvolemic at present  -continue lasix, monitor Cr, d/c pioglitozone; allergic to ACE;   CKD stage III (GFR 30-59 ml/min); resume home diuretics; monitor renal function  - Cr stable, at baseline   Pulmonary hypertension / Mild R heart failure PA pressure via TTE Dec 2014 1mm Hg - Continue Lasix   COPD no wheezing on exam; cont inhalers; Quit smoking 4 years ago  DVT Prophylaxis:  Code Status:  Family Communication:  Disposition: Per surgery  Consultants:  surgery  Procedures:    Antibiotics:  rocephin  flagyl  Subjective: Patient seen and examined,feels a lot better today, no N/V  Objective: Weight change:   Intake/Output Summary (Last 24 hours) at 10/02/13 1030 Last data filed at 10/02/13 0800  Gross per 24 hour  Intake    860 ml  Output    925 ml  Net    -65 ml   Blood pressure 140/45, pulse 63, temperature 98 F (36.7 C), temperature source Oral, resp. rate 17, height 5\' 3"  (1.6 m), weight 74.3 kg (163 lb 12.8 oz), SpO2 93.00%.  Physical  Exam: General: Alert and awake, oriented x3, not in any acute distress. CVS: S1-S2 clear, no murmur rubs or gallops Chest: clear to auscultation bilaterally, no wheezing, rales or rhonchi Abdomen: soft , ND, tender,  Extremities: no cyanosis, clubbing or edema noted bilaterally   Lab Results: Basic Metabolic Panel:  Recent Labs Lab 09/30/13 0530 10/01/13 0520  NA 136* 137  K 4.1 3.9  CL 101 101  CO2 23 23  GLUCOSE 252* 158*  BUN 30* 33*  CREATININE 1.96* 1.86*  CALCIUM 7.7* 7.6*   Liver Function Tests:  Recent Labs Lab 09/27/13 1733 09/30/13 0530  AST 13 11  ALT 10 8  ALKPHOS 58 57  BILITOT 0.4 <0.2*  PROT 7.2 6.1  ALBUMIN 3.5 2.5*    Recent Labs Lab 09/27/13 1733  LIPASE 16   No results found for this basename: AMMONIA,  in the last 168 hours CBC:  Recent Labs Lab 09/27/13 1733  09/29/13 0208 10/01/13 0520  WBC 12.0*  < > 8.6 6.2  NEUTROABS 10.6*  --   --   --   HGB 9.9*  < > 8.2* 8.3*  HCT 29.9*  < > 25.5* 26.0*  MCV 84.7  < > 89.5 85.0  PLT 188  < > 159 179  < > = values in this interval not displayed. Cardiac Enzymes: No results found for this basename: CKTOTAL, CKMB, CKMBINDEX, TROPONINI,  in the last 168 hours BNP: No components  found with this basename: POCBNP,  CBG:  Recent Labs Lab 10/01/13 0754 10/01/13 1152 10/01/13 1656 10/01/13 2245 10/02/13 0804  GLUCAP 146* 152* 96 135* 94     Micro Results: Recent Results (from the past 240 hour(s))  CULTURE, BLOOD (ROUTINE X 2)     Status: None   Collection Time    09/27/13  5:30 PM      Result Value Ref Range Status   Specimen Description BLOOD LEFT ARM   Final   Special Requests BOTTLES DRAWN AEROBIC AND ANAEROBIC BLUE 10 RED 5   Final   Culture  Setup Time     Final   Value: 09/28/2013 02:09     Performed at [facility]   Culture     Final   Value:        BLOOD CULTURE RECEIVED NO GROWTH TO DATE CULTURE WILL BE HELD FOR 5 DAYS BEFORE ISSUING A FINAL NEGATIVE REPORT      Performed at [facility]   Report Status PENDING   Incomplete  CULTURE, BLOOD (ROUTINE X 2)     Status: None   Collection Time    09/27/13  5:37 PM      Result Value Ref Range Status   Specimen Description BLOOD LEFT HAND   Final   Special Requests BOTTLES DRAWN AEROBIC ONLY 10 CC   Final   Culture  Setup Time     Final   Value: 09/28/2013 02:09     Performed at [facility]   Culture     Final   Value:        BLOOD CULTURE RECEIVED NO GROWTH TO DATE CULTURE WILL BE HELD FOR 5 DAYS BEFORE ISSUING A FINAL NEGATIVE REPORT     Performed at [facility]   Report Status PENDING   Incomplete  MRSA PCR SCREENING     Status: None   Collection Time    09/28/13  1:32 AM      Result Value Ref Range Status   MRSA by PCR NEGATIVE  NEGATIVE Final   Comment:            The GeneXpert MRSA Assay (FDA     approved for NASAL specimens     only), is one component of a     comprehensive MRSA colonization     surveillance program. It is not     intended to diagnose MRSA     infection nor to guide or     monitor treatment for     MRSA infections.    Studies/Results: Ct Abdomen Pelvis Wo Contrast  09/27/2013   CLINICAL DATA:  Left-sided abdominal pain with nausea and vomiting. History of prior periappendiceal abscess post drainage July 2014.  EXAM: CT ABDOMEN AND PELVIS WITHOUT CONTRAST  TECHNIQUE: Multidetector CT imaging of the abdomen and pelvis was performed following the standard protocol without IV contrast.  COMPARISON:  12/31/2012, 08/01/2012 and 07/19/2012  FINDINGS: The lung bases demonstrate minimal atelectasis over the posterior left lower lobe. There is minimal focal calcification along the right side of the pericardium unchanged.  Abdominal images demonstrate a normal liver, spleen, pancreas, gallbladder and adrenal glands. Kidneys are normal in size without hydronephrosis or nephrolithiasis. There is moderate calcified plaque involving the abdominal aorta and iliac  vessels.  The cecum is located just left of midline in the mid abdomen. There is a blind-ending tubular structure extending from the cecum measuring 1 cm diameter with adjacent inflammatory change likely  representing in inflamed appendix. No evidence of perforation or abscess.  There is mild diverticulosis of the colon. Remaining pelvic structures are unchanged. Mild spondylosis of the spine.  IMPRESSION: Findings compatible with mild acute appendicitis. Note that the cecum is located in the mid abdomen just left of midline. No evidence of perforation or abscess.  Mild diverticulosis of the colon.  These results were called by telephone at the time of interpretation on 09/27/2013 at 7:33 pm to Dr. Doy Mince, who verbally acknowledged these results.   Electronically Signed   By: Marin Olp M.D.   On: 09/27/2013 19:33   Dg Chest 2 View  09/27/2013   CLINICAL DATA:  Acute appendicitis.  Preop respiratory exam.  EXAM: CHEST  2 VIEW  COMPARISON:  01/19/2013  FINDINGS: Moderate cardiomegaly and pulmonary vascular congestion again noted. Pulmonary hyperinflation again demonstrated. No evidence of acute pulmonary edema or airspace disease. No evidence of pleural effusion. Prior median sternotomy again noted.  IMPRESSION: Stable cardiomegaly, pulmonary vascular congestion, and probable COPD. No acute findings.   Electronically Signed   By: Earle Gell M.D.   On: 09/27/2013 21:29   Dg Abd Portable 1v  09/28/2013   CLINICAL DATA:  Intraoperative examination for instrument count.  EXAM: PORTABLE ABDOMEN - 1 VIEW  COMPARISON:  CT abdomen and pelvis 09/27/2013.  FINDINGS: No radiopaque surgical foreign bodies are demonstrated in the visualized field of view. Vascular calcifications. Scattered stool in the colon. No small or large bowel distention. Enteric tube demonstrated in the left upper quadrant.  IMPRESSION: Negative for postoperative purposes.  Results were telephoned to the operating room at 2355 hr on 09/27/2013.    Electronically Signed   By: Lucienne Capers M.D.   On: 09/28/2013 00:03    Medications: Scheduled Meds: . amLODipine  10 mg Oral Daily  . cefTRIAXone (ROCEPHIN)  IV  1 g Intravenous QHS  . cloNIDine  0.2 mg Oral TID  . docusate sodium  100 mg Oral BID  . enoxaparin (LOVENOX) injection  30 mg Subcutaneous Daily  . furosemide  40 mg Oral Daily  . insulin aspart  0-15 Units Subcutaneous TID WC  . insulin detemir  18 Units Subcutaneous Daily  . metronidazole  500 mg Intravenous Q8H  . polyethylene glycol  17 g Oral Daily  . tamsulosin  0.4 mg Oral Daily      LOS: 5 days   Channelle Bottger M.D. [facility] 10/02/2013, 10:30 AM Pager: [phone]  If 7PM-7AM, please contact night-coverage [email or web address] Password [email or web address]  **Disclaimer: This note was dictated with voice recognition software. Similar sounding words can inadvertently be transcribed and this note may contain transcription errors which may not have been corrected upon publication of note.**

## 2013-10-02 NOTE — Progress Notes (Signed)
Patient ID: Laura Best, female   DOB: 1940/07/27, 73 y.o.   MRN: [id number]     [facility]      [address] [place], [place] [address]    Phone: [phone] FAX: [phone]     Subjective: Having BMs, no n/v, tolerating full liquids.  Ambulating.  No sob, CP.  Using IS/flutter valve.  Objective:  Vital signs:  Filed Vitals:   10/01/13 0633 10/01/13 1336 10/01/13 2245 10/02/13 0543  BP: 121/41 120/47 117/48 119/45  Pulse: 55 57 53 55  Temp: 98.9 F (37.2 C) 97.6 F (36.4 C) 98 F (36.7 C) 99.1 F (37.3 C)  TempSrc: Oral Oral Oral Oral  Resp: $Remo'18 16 16 18  'rAWzo$ Height:      Weight:      SpO2: 93% 93% 93% 91%    Last BM Date: 10/01/13  Intake/Output   Yesterday:  09/09 0701 - 09/10 0700 In: 860 [P.O.:560; IV Piggyback:300] Out: 725 [Urine:725] This shift:    I/O last 3 completed shifts: In: 38 [P.O.:680; IV Piggyback:450] Out: 1200 [Urine:1200]    Physical Exam:  General: Pt awake/alert/oriented x4 in no acute distress  Chest: cta. No chest wall pain w good excursion  CV: Pulses intact. Regular rhythm  MS: Normal AROM mjr joints. No obvious deformity  Abdomen: Soft. Nondistended. Appropriately tender. Midline wound, c/d/i. No evidence of peritonitis. No incarcerated hernias.  Ext: SCDs BLE. No mjr edema. No cyanosis  Skin: No petechiae / purpura   Problem List:   Principal Problem:   Acute appendicitis with peritoneal abscess Active Problems:   Hypertension   Diabetes type 2, uncontrolled    Results:   Labs: Results for orders placed during the hospital encounter of 09/27/13 (from the past 48 hour(s))  GLUCOSE, CAPILLARY     Status: Abnormal   Collection Time    09/30/13 11:47 AM      Result Value Ref Range   Glucose-Capillary 193 (*) 70 - 99 mg/dL   Comment 1 Notify RN    GLUCOSE, CAPILLARY     Status: Abnormal   Collection Time    09/30/13  4:40 PM      Result Value Ref Range   Glucose-Capillary 179 (*) 70 - 99 mg/dL   Comment 1 Notify RN    GLUCOSE, CAPILLARY     Status: Abnormal   Collection Time    09/30/13 10:08 PM      Result Value Ref Range   Glucose-Capillary 108 (*) 70 - 99 mg/dL   Comment 1 Notify RN     Comment 2 Documented in Chart    CBC     Status: Abnormal   Collection Time    10/01/13  5:20 AM      Result Value Ref Range   WBC 6.2  4.0 - 10.5 K/uL   RBC 3.06 (*) 3.87 - 5.11 MIL/uL   Hemoglobin 8.3 (*) 12.0 - 15.0 g/dL   HCT 26.0 (*) 36.0 - 46.0 %   MCV 85.0  78.0 - 100.0 fL   MCH 27.1  26.0 - 34.0 pg   MCHC 31.9  30.0 - 36.0 g/dL   RDW 15.2  11.5 - 15.5 %   Platelets 179  150 - 400 K/uL  BASIC METABOLIC PANEL     Status: Abnormal   Collection Time    10/01/13  5:20 AM      Result Value Ref Range   Sodium 137  137 -  147 mEq/L   Potassium 3.9  3.7 - 5.3 mEq/L   Chloride 101  96 - 112 mEq/L   CO2 23  19 - 32 mEq/L   Glucose, Bld 158 (*) 70 - 99 mg/dL   BUN 33 (*) 6 - 23 mg/dL   Creatinine, Ser 1.86 (*) 0.50 - 1.10 mg/dL   Calcium 7.6 (*) 8.4 - 10.5 mg/dL   GFR calc non Af Amer 26 (*) >90 mL/min   GFR calc Af Amer 30 (*) >90 mL/min   Comment: (NOTE)     The eGFR has been calculated using the CKD EPI equation.     This calculation has not been validated in all clinical situations.     eGFR's persistently <90 mL/min signify possible Chronic Kidney     Disease.   Anion gap 13  5 - 15  GLUCOSE, CAPILLARY     Status: Abnormal   Collection Time    10/01/13  7:54 AM      Result Value Ref Range   Glucose-Capillary 146 (*) 70 - 99 mg/dL  GLUCOSE, CAPILLARY     Status: Abnormal   Collection Time    10/01/13 11:52 AM      Result Value Ref Range   Glucose-Capillary 152 (*) 70 - 99 mg/dL  GLUCOSE, CAPILLARY     Status: None   Collection Time    10/01/13  4:56 PM      Result Value Ref Range   Glucose-Capillary 96  70 - 99 mg/dL  GLUCOSE, CAPILLARY     Status: Abnormal   Collection Time    10/01/13 10:45 PM      Result Value Ref Range    Glucose-Capillary 135 (*) 70 - 99 mg/dL   Comment 1 Documented in Chart     Comment 2 Notify RN      Imaging / Studies: No results found.  Medications / Allergies:  Scheduled Meds: . amLODipine  10 mg Oral Daily  . cefTRIAXone (ROCEPHIN)  IV  1 g Intravenous QHS  . cloNIDine  0.2 mg Oral TID  . docusate sodium  100 mg Oral BID  . enoxaparin (LOVENOX) injection  30 mg Subcutaneous Daily  . furosemide  40 mg Oral Daily  . insulin aspart  0-15 Units Subcutaneous TID WC  . insulin detemir  18 Units Subcutaneous Daily  . metronidazole  500 mg Intravenous Q8H  . polyethylene glycol  17 g Oral Daily  . tamsulosin  0.4 mg Oral Daily   Continuous Infusions:  PRN Meds:.acetaminophen, albuterol, morphine injection, ondansetron (ZOFRAN) IV, ondansetron, traMADol  Antibiotics: Anti-infectives   Start     Dose/Rate Route Frequency Ordered Stop   09/28/13 0200  metroNIDAZOLE (FLAGYL) IVPB 500 mg     500 mg 100 mL/hr over 60 Minutes Intravenous Every 8 hours 09/28/13 0022     09/28/13 0200  cefTRIAXone (ROCEPHIN) 1 g in dextrose 5 % 50 mL IVPB     1 g 100 mL/hr over 30 Minutes Intravenous Daily at bedtime 09/28/13 0123     09/27/13 1715  piperacillin-tazobactam (ZOSYN) IVPB 3.375 g     3.375 g 12.5 mL/hr over 240 Minutes Intravenous  Once 09/27/13 1703 09/27/13 2203       Assessment/Plan  POD#5 s/p lap converted to open appy for perforated appendicitis---Dr. [name]  -advance to soft diet -miralax -SCD/lovenox  -IS(590ml), flutter valve  -mobilize, up to chair, PT eval  -rocephin/flagyl 9/6 change to cipro/flagyl to complete 7 days course Urinary retention  -  start flomax, DC foley.  If unable to void, will replace foley and DC home with foley and OP urology follow up HTN-stable, clonidine  DM-SSI, levemir  CKD-stable, BMP in AM dCHF-euvolemic, lasix resumed  Dispo-home with son and daughter in law, PT-no follow up.  Anticipate DC tomorrow.    Erby Pian, [facility] Pager [phone]) For consults and floor pages call [phone](7A-4:30P)  10/02/2013 7:54 AM

## 2013-10-03 DIAGNOSIS — K3533 Acute appendicitis with perforation and localized peritonitis, with abscess: Secondary | ICD-10-CM | POA: Diagnosis not present

## 2013-10-03 LAB — BASIC METABOLIC PANEL
Anion gap: 14 (ref 5–15)
BUN: 30 mg/dL — AB (ref 6–23)
CO2: 22 mEq/L (ref 19–32)
Calcium: 8.1 mg/dL — ABNORMAL LOW (ref 8.4–10.5)
Chloride: 104 mEq/L (ref 96–112)
Creatinine, Ser: 1.82 mg/dL — ABNORMAL HIGH (ref 0.50–1.10)
GFR calc Af Amer: 31 mL/min — ABNORMAL LOW (ref >90)
GFR, EST NON AFRICAN AMERICAN: 26 mL/min — AB (ref >90)
GLUCOSE: 87 mg/dL (ref 70–99)
POTASSIUM: 4.2 meq/L (ref 3.7–5.3)
Sodium: 140 mEq/L (ref 137–147)

## 2013-10-03 LAB — GLUCOSE, CAPILLARY: GLUCOSE-CAPILLARY: 101 mg/dL — AB (ref 70–99)

## 2013-10-03 MED ORDER — TAMSULOSIN HCL 0.4 MG PO CAPS: 0.4000 mg | ORAL_CAPSULE | Freq: Every day | ORAL | Status: DC

## 2013-10-03 MED ORDER — TRAMADOL HCL 50 MG PO TABS: 50.0000 mg | ORAL_TABLET | Freq: Four times a day (QID) | ORAL | Status: DC | PRN

## 2013-10-03 MED ORDER — METRONIDAZOLE 500 MG PO TABS: 500.0000 mg | ORAL_TABLET | Freq: Three times a day (TID) | ORAL | Status: DC

## 2013-10-03 MED ORDER — CIPROFLOXACIN HCL 500 MG PO TABS: 500.0000 mg | ORAL_TABLET | Freq: Every day | ORAL | Status: DC

## 2013-10-03 MED ORDER — DSS 100 MG PO CAPS: 100.0000 mg | ORAL_CAPSULE | Freq: Two times a day (BID) | ORAL | Status: DC

## 2013-10-03 NOTE — Discharge Summary (Signed)
I have seen and examined the patient and agree with the assessment and plans.  Marinna Blane A. Armoni Kludt  MD, FACS  

## 2013-10-03 NOTE — Progress Notes (Signed)
Patient ID: Laura Best  female  [id number]    DOB: 1940-09-11    DOA: 09/27/2013  PCP: [name], PA-C  73 yo F with a history of HTN, Diastolic CHF, DM2, Hyperlipidemia, and Stage III CKD who presented to the ED with complaints of LLQ ABD Pain with nausea/vomiting and was found on CT scan to have appendicitis with peritonitis on CT scan. Dr. [name] took her to the OR for an appendectomy. [facility] were consulted for medical management.   Assessment/Plan: Principal Problem:   Acute appendicitis with peritoneal abscess - Postop day #6, s/p open appendectomy for perforated appendicitis - Management per surgery - tolerating solids  Active Problems:   Hypertension- stable - Continue Norvasc, clonidine    Diabetes type 2, uncontrolled - Hemoglobin A1c 7.6, resume home regimen at DC   Chronic diastolic CHF TTE Dec 123456 w/ EF 55% and grade 2 DD - euvolemic at present  -continue lasix at home dose, allergic to ACE;   CKD stage III (GFR 30-59 ml/min); resume home diuretics; monitor renal function  - Cr stable, at baseline   Pulmonary hypertension / Mild R heart failure PA pressure via TTE Dec 2014 36mm Hg - Continue Lasix at home dose  COPD no wheezing on exam; cont inhalers; Quit smoking 4 years ago  DVT Prophylaxis:  Code Status:  Family Communication:  Disposition: Per surgery, pt stable from medical stand point for DC. D/w Ms Oneida Arenas, please resume her out-patient meds at discharge.    Consultants:  surgery  Procedures:    Antibiotics:  rocephin  flagyl  Subjective: Patient seen and examined, denies any complaints, drain removed, no N/V/F/C  Objective: Weight change:   Intake/Output Summary (Last 24 hours) at 10/03/13 0741 Last data filed at 10/02/13 1900  Gross per 24 hour  Intake    940 ml  Output    200 ml  Net    740 ml   Blood pressure 130/53, pulse 66, temperature 99.2 F (37.3 C), temperature source Oral, resp. rate 19,  height 5\' 3"  (1.6 m), weight 74.3 kg (163 lb 12.8 oz), SpO2 92.00%.  Physical Exam: General: Alert and awake, oriented x3, NAD CVS: S1-S2 clear, no murmur rubs or gallops Chest: clear to auscultation bilaterally, no wheezing, rales or rhonchi Abdomen: soft , ND, NT Extremities: no c/c/e bilaterally   Lab Results: Basic Metabolic Panel:  Recent Labs Lab 10/01/13 0520 10/03/13 0500  NA 137 140  K 3.9 4.2  CL 101 104  CO2 23 22  GLUCOSE 158* 87  BUN 33* 30*  CREATININE 1.86* 1.82*  CALCIUM 7.6* 8.1*   Liver Function Tests:  Recent Labs Lab 09/27/13 1733 09/30/13 0530  AST 13 11  ALT 10 8  ALKPHOS 58 57  BILITOT 0.4 <0.2*  PROT 7.2 6.1  ALBUMIN 3.5 2.5*    Recent Labs Lab 09/27/13 1733  LIPASE 16   No results found for this basename: AMMONIA,  in the last 168 hours CBC:  Recent Labs Lab 09/27/13 1733  09/29/13 0208 10/01/13 0520  WBC 12.0*  < > 8.6 6.2  NEUTROABS 10.6*  --   --   --   HGB 9.9*  < > 8.2* 8.3*  HCT 29.9*  < > 25.5* 26.0*  MCV 84.7  < > 89.5 85.0  PLT 188  < > 159 179  < > = values in this interval not displayed. Cardiac Enzymes: No results found for this basename: CKTOTAL, CKMB, CKMBINDEX, TROPONINI,  in the last 168 hours BNP: No components found with this basename: POCBNP,  CBG:  Recent Labs Lab 10/01/13 2245 10/02/13 0804 10/02/13 1158 10/02/13 1710 10/02/13 2133  GLUCAP 135* 94 139* 93 99     Micro Results: Recent Results (from the past 240 hour(s))  CULTURE, BLOOD (ROUTINE X 2)     Status: None   Collection Time    09/27/13  5:30 PM      Result Value Ref Range Status   Specimen Description BLOOD LEFT ARM   Final   Special Requests BOTTLES DRAWN AEROBIC AND ANAEROBIC BLUE 10 RED 5   Final   Culture  Setup Time     Final   Value: 09/28/2013 02:09     Performed at [facility]   Culture     Final   Value:        BLOOD CULTURE RECEIVED NO GROWTH TO DATE CULTURE WILL BE HELD FOR 5 DAYS BEFORE ISSUING A FINAL  NEGATIVE REPORT     Performed at [facility]   Report Status PENDING   Incomplete  CULTURE, BLOOD (ROUTINE X 2)     Status: None   Collection Time    09/27/13  5:37 PM      Result Value Ref Range Status   Specimen Description BLOOD LEFT HAND   Final   Special Requests BOTTLES DRAWN AEROBIC ONLY 10 CC   Final   Culture  Setup Time     Final   Value: 09/28/2013 02:09     Performed at [facility]   Culture     Final   Value:        BLOOD CULTURE RECEIVED NO GROWTH TO DATE CULTURE WILL BE HELD FOR 5 DAYS BEFORE ISSUING A FINAL NEGATIVE REPORT     Performed at [facility]   Report Status PENDING   Incomplete  MRSA PCR SCREENING     Status: None   Collection Time    09/28/13  1:32 AM      Result Value Ref Range Status   MRSA by PCR NEGATIVE  NEGATIVE Final   Comment:            The GeneXpert MRSA Assay (FDA     approved for NASAL specimens     only), is one component of a     comprehensive MRSA colonization     surveillance program. It is not     intended to diagnose MRSA     infection nor to guide or     monitor treatment for     MRSA infections.    Studies/Results: Ct Abdomen Pelvis Wo Contrast  09/27/2013   CLINICAL DATA:  Left-sided abdominal pain with nausea and vomiting. History of prior periappendiceal abscess post drainage July 2014.  EXAM: CT ABDOMEN AND PELVIS WITHOUT CONTRAST  TECHNIQUE: Multidetector CT imaging of the abdomen and pelvis was performed following the standard protocol without IV contrast.  COMPARISON:  12/31/2012, 08/01/2012 and 07/19/2012  FINDINGS: The lung bases demonstrate minimal atelectasis over the posterior left lower lobe. There is minimal focal calcification along the right side of the pericardium unchanged.  Abdominal images demonstrate a normal liver, spleen, pancreas, gallbladder and adrenal glands. Kidneys are normal in size without hydronephrosis or nephrolithiasis. There is moderate calcified plaque involving the  abdominal aorta and iliac vessels.  The cecum is located just left of midline in the mid abdomen. There is a blind-ending tubular structure extending from the cecum measuring  1 cm diameter with adjacent inflammatory change likely representing in inflamed appendix. No evidence of perforation or abscess.  There is mild diverticulosis of the colon. Remaining pelvic structures are unchanged. Mild spondylosis of the spine.  IMPRESSION: Findings compatible with mild acute appendicitis. Note that the cecum is located in the mid abdomen just left of midline. No evidence of perforation or abscess.  Mild diverticulosis of the colon.  These results were called by telephone at the time of interpretation on 09/27/2013 at 7:33 pm to Dr. Doy Mince, who verbally acknowledged these results.   Electronically Signed   By: Marin Olp M.D.   On: 09/27/2013 19:33   Dg Chest 2 View  09/27/2013   CLINICAL DATA:  Acute appendicitis.  Preop respiratory exam.  EXAM: CHEST  2 VIEW  COMPARISON:  01/19/2013  FINDINGS: Moderate cardiomegaly and pulmonary vascular congestion again noted. Pulmonary hyperinflation again demonstrated. No evidence of acute pulmonary edema or airspace disease. No evidence of pleural effusion. Prior median sternotomy again noted.  IMPRESSION: Stable cardiomegaly, pulmonary vascular congestion, and probable COPD. No acute findings.   Electronically Signed   By: Earle Gell M.D.   On: 09/27/2013 21:29   Dg Abd Portable 1v  09/28/2013   CLINICAL DATA:  Intraoperative examination for instrument count.  EXAM: PORTABLE ABDOMEN - 1 VIEW  COMPARISON:  CT abdomen and pelvis 09/27/2013.  FINDINGS: No radiopaque surgical foreign bodies are demonstrated in the visualized field of view. Vascular calcifications. Scattered stool in the colon. No small or large bowel distention. Enteric tube demonstrated in the left upper quadrant.  IMPRESSION: Negative for postoperative purposes.  Results were telephoned to the operating room at  2355 hr on 09/27/2013.   Electronically Signed   By: Lucienne Capers M.D.   On: 09/28/2013 00:03    Medications: Scheduled Meds: . amLODipine  10 mg Oral Daily  . ciprofloxacin  500 mg Oral Daily  . cloNIDine  0.2 mg Oral TID  . docusate sodium  100 mg Oral BID  . enoxaparin (LOVENOX) injection  30 mg Subcutaneous Daily  . furosemide  40 mg Oral Daily  . Influenza vac split quadrivalent PF  0.5 mL Intramuscular Tomorrow-1000  . insulin aspart  0-15 Units Subcutaneous TID WC  . insulin detemir  18 Units Subcutaneous Daily  . metroNIDAZOLE  500 mg Oral 3 times per day  . polyethylene glycol  17 g Oral Daily  . tamsulosin  0.4 mg Oral Daily      LOS: 6 days   Laura Best M.D. [facility] 10/03/2013, 7:41 AM Pager: [phone]  If 7PM-7AM, please contact night-coverage [email or web address] Password [email or web address]  **Disclaimer: This note was dictated with voice recognition software. Similar sounding words can inadvertently be transcribed and this note may contain transcription errors which may not have been corrected upon publication of note.**

## 2013-10-03 NOTE — Progress Notes (Signed)
Discussed discharge summary with patient. Reviewed all medications with patient. Patient received Rx. Patient educated on Influenza vaccine. Patient ready for discharge.

## 2013-10-04 LAB — CULTURE, BLOOD (ROUTINE X 2)
Culture: NO GROWTH
Culture: NO GROWTH

## 2013-10-09 ENCOUNTER — Telehealth: Payer: Self-pay | Admitting: *Deleted

## 2013-10-09 NOTE — Telephone Encounter (Signed)
Received call from [place] from [facility] and she was going through pts med list and saw that she was taking FLOMAX and was wanting to know what the reason she was on it and if she was actually taking the medication, I looked in her med list and saw she was taking it but wasn't sure about reason why. Can you help me and letting me know why she was taking this. Sarah and I are just wanting to be sure she is taking right med. Please advise.

## 2013-10-09 NOTE — Telephone Encounter (Signed)
No, she should not be taking Flomax.  Remove this medication from med list.

## 2013-10-10 NOTE — Telephone Encounter (Signed)
Contacted Sarah back from [facility] to let her know that pt is not suppose to be taking this medication and I will remove it off her list.

## 2013-10-27 ENCOUNTER — Encounter: Payer: Self-pay | Admitting: Family Medicine

## 2013-10-27 NOTE — Telephone Encounter (Signed)
error 

## 2013-10-30 ENCOUNTER — Ambulatory Visit (INDEPENDENT_AMBULATORY_CARE_PROVIDER_SITE_OTHER): Payer: Medicare HMO | Admitting: Physician Assistant

## 2013-10-30 ENCOUNTER — Encounter: Payer: Self-pay | Admitting: Physician Assistant

## 2013-10-30 VITALS — BP 156/76 | HR 72 | Temp 98.4°F | Resp 18 | Ht 62.0 in | Wt 156.0 lb

## 2013-10-30 DIAGNOSIS — Z0000 Encounter for general adult medical examination without abnormal findings: Secondary | ICD-10-CM

## 2013-10-30 DIAGNOSIS — IMO0002 Reserved for concepts with insufficient information to code with codable children: Secondary | ICD-10-CM

## 2013-10-30 DIAGNOSIS — E1165 Type 2 diabetes mellitus with hyperglycemia: Secondary | ICD-10-CM

## 2013-10-30 MED ORDER — FUROSEMIDE 40 MG PO TABS: 40.0000 mg | ORAL_TABLET | Freq: Every day | ORAL | Status: DC

## 2013-10-30 NOTE — Progress Notes (Signed)
Subjective:   Patient presents for Medicare Annual/Subsequent preventive examination.   Review Past Medical/Family/Social: These are all documented in EPIC and reviewed today  Risk Factors  Current exercise habits: Does stretches at home and walks some at home--both inside and outside.  Dietary issues discussed: Follows Diabetic Diet  Cardiac risk factors: Sees Cardiology routinely  Depression Screen  (Note: if answer to either of the following is "Yes", a more complete depression screening is indicated)  Over the past two weeks, have you felt down, depressed or hopeless? No Over the past two weeks, have you felt little interest or pleasure in doing things? No Have you lost interest or pleasure in daily life? No Do you often feel hopeless? No Do you cry easily over simple problems? No   Activities of Daily Living  In your present state of health, do you have any difficulty performing the following activities?:  Driving? She has never driven Managing money? No  Feeding yourself? No  Getting from bed to chair? No  Climbing a flight of stairs? No  Preparing food and eating?: No  Bathing or showering? No  Getting dressed: No  Getting to the toilet? No  Using the toilet:No  Moving around from place to place: No  In the past year have you fallen or had a near fall?:No  Are you sexually active? No  Do you have more than one partner? No   Hearing Difficulties: No  Do you often ask people to speak up or repeat themselves? No  Do you experience ringing or noises in your ears? No Do you have difficulty understanding soft or whispered voices? No  Do you feel that you have a problem with memory? No Do you often misplace items? No  Do you feel safe at home? Yes  Cognitive Testing  Alert? Yes Normal Appearance?Yes  Oriented to person? Yes Place? Yes  Time? Yes  Recall of three objects? Yes  Can perform simple calculations? Yes  Displays appropriate judgment?Yes  Can read the  correct time from a watch face?Yes   List the Names of Other Physician/Practitioners you currently use:  [facility]  Indicate any recent Medical Services you may have received from other than [facility] providers in the past year (date may be approximate).   Screening Tests / Date Colonoscopy-----Dr. Benson Norway-- 08/13/2008                    Zostavax --deferred sec to cost Mammogram ---- 02/25/2013 Influenza Vaccine --- 09/2013 Tetanus/tdap---12/2012    Assessment:    Annual wellness medicare exam   Plan:    During the course of the visit the patient was educated and counseled about appropriate screening and preventive services including:  Screening mammography  Colorectal cancer screening  Shingles vaccine. Prescription given to that she can get the vaccine at the pharmacy or Medicare part D.  Screen + for depression. PHQ- 9 score of 12 (moderate depression). We discussed the options of counseling versus possibly a medication. I encouraged her strongly think about the counseling. She is going through some medical problems currently and her husband is as well Mrs. been very stressful for her. She says she will think about it. She does have Xanax to use as needed. Though she may benefit from an SSRI for her more depressive type symptoms but she wants to hold off at this time.  I aksed her to please have her cardioloist send records since we have none on file.  Diet review for  nutrition referral? Yes ____ Not Indicated __x__  Patient Instructions (the written plan) was given to the patient.  Medicare Attestation  I have personally reviewed:  The patient's medical and social history  Their use of alcohol, tobacco or illicit drugs  Their current medications and supplements  The patient's functional ability including ADLs,fall risks, home safety risks, cognitive, and hearing and visual impairment  Diet and physical activities  Evidence for depression or mood disorders  The patient's weight,  height, BMI, and visual acuity have been recorded in the chart. I have made referrals, counseling, and provided education to the patient based on review of the above and I have provided the patient with a written personalized care plan for preventive services.     Review of Systems: Consitutional: No fever, chills, fatigue, night sweats, lymphadenopathy. No significant/unexplained weight changes. Eyes: No visual changes, eye redness, or discharge. ENT/Mouth: No ear pain, sore throat, nasal drainage, or sinus pain. Cardiovascular: No chest pressure,heaviness, tightness or squeezing, even with exertion. No increased shortness of breath or dyspnea on exertion.No palpitations, edema, orthopnea, PND. Respiratory: No cough, hemoptysis, SOB, or wheezing. Gastrointestinal: No anorexia, dysphagia, reflux, pain, nausea, vomiting, hematemesis, diarrhea, constipation, BRBPR, or melena. Breast: No mass, nodules, bulging, or retraction. No skin changes or inflammation. No nipple discharge. No lymphadenopathy. Genitourinary: No dysuria, hematuria, incontinence, vaginal discharge, pruritis, burning, abnormal bleeding, or pain. Musculoskeletal: No decreased ROM, No joint pain or swelling. No significant pain in neck, back, or extremities. Skin: No rash, pruritis, or concerning lesions. Neurological: No headache, dizziness, syncope, seizures, tremors, memory loss, coordination problems, or paresthesias. Psychological: No anxiety, depression, hallucinations, SI/HI. Endocrine: No polydipsia, polyphagia, polyuria, or known diabetes.No increased fatigue. No palpitations/rapid heart rate. No significant/unexplained weight change. All other systems were reviewed and are otherwise negative.  Past Medical History  Diagnosis Date  . Diabetes mellitus   . Hypertension   . Hyperlipidemia   . Tricuspid regurgitation     Echo 05/02/06-Nml LV. Mild MR. Mod TR  . Osteoporosis   . Diastolic heart failure   . Vitamin D  deficiency   . Osteoporosis   . CKD (chronic kidney disease) stage 3, GFR 30-59 ml/min 12/25/2012     Past Surgical History  Procedure Laterality Date  . Heart chamber revision      patched hole --1993  . Tubal ligation    . Laparoscopic appendectomy N/A 09/27/2013    Procedure: APPENDECTOMY - CONVERTED FROM LAPAROSCOPIC;  Surgeon: Donnie Mesa, MD;  Location: [facility];  Service: General;  Laterality: N/A;    Home Meds:  Outpatient Prescriptions Prior to Visit  Medication Sig Dispense Refill  . acetaminophen (TYLENOL) 500 MG tablet Take 500 mg by mouth every 6 (six) hours as needed for pain.      Marland Kitchen amLODipine (NORVASC) 10 MG tablet Take 10 mg by mouth daily.      Marland Kitchen aspirin EC 81 MG tablet Take 81 mg by mouth daily.      . cholecalciferol (VITAMIN D) 1000 UNITS tablet Take 4,000 Units by mouth daily.       . cloNIDine (CATAPRES) 0.2 MG tablet Take 0.2 mg by mouth 2 (two) times daily.      . Insulin Detemir (LEVEMIR) 100 UNIT/ML Pen Inject 18 Units into the skin every morning.      . simvastatin (ZOCOR) 20 MG tablet Take 20 mg by mouth at bedtime.      . traMADol (ULTRAM) 50 MG tablet Take 1 tablet (50 mg total) by  mouth every 6 (six) hours as needed for moderate pain or severe pain.  30 tablet  0  . ciprofloxacin (CIPRO) 500 MG tablet Take 1 tablet (500 mg total) by mouth daily.  6 tablet  0  . furosemide (LASIX) 40 MG tablet Take 40 mg by mouth daily.      Marland Kitchen alendronate (FOSAMAX) 70 MG tablet Take 70 mg by mouth once a week. Take with a full glass of water on an empty stomach.      . docusate sodium 100 MG CAPS Take 100 mg by mouth 2 (two) times daily.  10 capsule  0  . metroNIDAZOLE (FLAGYL) 500 MG tablet Take 1 tablet (500 mg total) by mouth every 8 (eight) hours.  24 tablet  0  . pioglitazone (ACTOS) 30 MG tablet Take 30 mg by mouth daily.       No facility-administered medications prior to visit.    Allergies:  Allergies  Allergen Reactions  . Ace Inhibitors Other (See Comments)     Hyperkalemia  . Aspirin Other (See Comments)    G.I. Upset.   . Metformin And Related Diarrhea    History   Social History  . Marital Status: Widowed    Spouse Name: N/A    Number of Children: N/A  . Years of Education: N/A   Occupational History  . Not on file.   Social History Main Topics  . Smoking status: Former Smoker    Quit date: 01/19/2009  . Smokeless tobacco: Never Used  . Alcohol Use: No  . Drug Use: No  . Sexual Activity: Not on file   Other Topics Concern  . Not on file   Social History Narrative  . No narrative on file    Family History  Problem Relation Age of Onset  . Cancer Father   . Diabetes Father   . Cancer Brother     Brain  . Cancer Sister     abdominal fat    Physical Exam: Blood pressure 156/76, pulse 72, temperature 98.4 F (36.9 C), temperature source Oral, resp. rate 18, height 5\' 2"  (1.575 m), weight 156 lb (70.761 kg)., Body mass index is 28.53 kg/(m^2). General: Well developed, well nourished, WF. Appears in no acute distress. HEENT: Normocephalic, atraumatic. Conjunctiva pink, sclera non-icteric. Pupils 2 mm constricting to 1 mm, round, regular, and equally reactive to light and accomodation. EOMI. Internal auditory canal clear. TMs with good cone of light and without pathology. Nasal mucosa pink. Nares are without discharge. No sinus tenderness. Oral mucosa pink.  Pharynx without exudate.   Neck: Supple. Trachea midline. No thyromegaly. Full ROM. No lymphadenopathy.No Carotid Bruits. Lungs: Clear to auscultation bilaterally without wheezes, rales, or rhonchi. Breathing is of normal effort and unlabored. Cardiovascular: RRR with S1 S2. No murmurs, rubs, or gallops. Distal pulses 2+ symmetrically. No carotid or abdominal bruit. Abdomen: Soft,  non-distended. She has wound dressing in place. Clean and dry.  Musculoskeletal: Full range of motion and 5/5 strength throughout. Without swelling. Skin: Warm and moist without erythema,  ecchymosis, wounds, or rash. Neuro: A+Ox3. CN II-XII grossly intact. Moves all extremities spontaneously. Full sensation throughout. Normal gait. Psych:  Responds to questions appropriately with a normal affect.   Assessment/Plan:  73 y.o. y/o female here for CPE 1. Visit for preventive health examination  A. Screening Labs: She has had recent CBC, CMET. Her last lipid panel was 07/04/13 which showed triglycerides 70,    HDL 58,     LDL 67  B. [id number] 73.  C. Screening Mammogram: Last mammogram was 02/25/2013  D. DEXA/BMD:  She has been on Fosamax in the past.                            Will not do further bone density test as I would not do further treatment regardless of the test results. E. Colorectal Cancer Screening:       Last colonoscopy was by Dr. Benson Norway 08/13/2008 F. Immunizations:  Influenza: 09/2013 Tetanus: 12/2012 Pneumococcal:  She received the Pneumovax 11/30/09. Prevnar 13 --given here 12/2012.  Zostavax: she defers.  1. Visit for preventive health examination Has had no screening for Glaucoma. Pt agreeable for me to refer.  - Ambulatory referral to Ophthalmology  2. Diabetes type 2, uncontrolled - Ambulatory referral to Ophthalmology   Signed, [facility], [place], [facility] 10/30/2013 2:27 PM

## 2013-11-04 ENCOUNTER — Other Ambulatory Visit: Payer: Self-pay | Admitting: Physician Assistant

## 2013-11-04 NOTE — Telephone Encounter (Signed)
Medication refilled per protocol. 

## 2013-11-17 ENCOUNTER — Telehealth: Payer: Self-pay | Admitting: Family Medicine

## 2013-11-17 NOTE — Telephone Encounter (Signed)
Patient is out of town, mother is dying.  Having trouble with swelling in feet, lower legs.  Get better at night but then swell up again in the day.  Should she increase her "water pill"??

## 2013-11-17 NOTE — Telephone Encounter (Signed)
Patient called and given provider instructions

## 2013-11-17 NOTE — Telephone Encounter (Signed)
She is on Lasix 40mg  .  Tell her she can take an extra 1/2 of a lasix each day for  2- 3 days.  Then needs to return to usual dose.

## 2013-11-20 ENCOUNTER — Telehealth: Payer: Self-pay | Admitting: Family Medicine

## 2013-11-20 NOTE — Telephone Encounter (Signed)
Yes go ahead and stop this. Remove it from her medication list.

## 2013-11-20 NOTE — Telephone Encounter (Signed)
rec'd from [facility] non-adherence therapy advisory for Pioglitazone.  Per review of chart, on visit 10/30/13 patient told to stop Pioglitazone when discharged form recent hospital stay. Please advise?

## 2013-11-20 NOTE — Telephone Encounter (Signed)
[employer] informed that Pioglitazone has been discontinued

## 2013-12-25 NOTE — Telephone Encounter (Signed)
CMET, FLP showing up in Overdue Results.  I reviewed. She is on simvastatin. Last lipid panel and liver test were 06/2013. Please call patient and tell her to come in fasting for lab work.

## 2014-01-05 ENCOUNTER — Other Ambulatory Visit: Payer: Self-pay | Admitting: Adult Health

## 2014-01-29 ENCOUNTER — Ambulatory Visit: Payer: Medicare HMO | Admitting: Physician Assistant

## 2014-02-10 ENCOUNTER — Telehealth: Payer: Self-pay | Admitting: *Deleted

## 2014-02-10 NOTE — Telephone Encounter (Signed)
Fax from [facility] clonidine 0.2 needs called in

## 2014-02-11 ENCOUNTER — Encounter: Payer: Self-pay | Admitting: Physician Assistant

## 2014-02-11 ENCOUNTER — Ambulatory Visit (INDEPENDENT_AMBULATORY_CARE_PROVIDER_SITE_OTHER): Payer: Medicare HMO | Admitting: Physician Assistant

## 2014-02-11 VITALS — BP 132/90 | HR 60 | Temp 98.3°F | Resp 18 | Wt 164.0 lb

## 2014-02-11 DIAGNOSIS — M810 Age-related osteoporosis without current pathological fracture: Secondary | ICD-10-CM

## 2014-02-11 DIAGNOSIS — N183 Chronic kidney disease, stage 3 unspecified: Secondary | ICD-10-CM

## 2014-02-11 DIAGNOSIS — D638 Anemia in other chronic diseases classified elsewhere: Secondary | ICD-10-CM

## 2014-02-11 DIAGNOSIS — E559 Vitamin D deficiency, unspecified: Secondary | ICD-10-CM

## 2014-02-11 DIAGNOSIS — Z1211 Encounter for screening for malignant neoplasm of colon: Secondary | ICD-10-CM

## 2014-02-11 DIAGNOSIS — E1165 Type 2 diabetes mellitus with hyperglycemia: Secondary | ICD-10-CM

## 2014-02-11 DIAGNOSIS — IMO0002 Reserved for concepts with insufficient information to code with codable children: Secondary | ICD-10-CM

## 2014-02-11 DIAGNOSIS — I10 Essential (primary) hypertension: Secondary | ICD-10-CM

## 2014-02-11 DIAGNOSIS — I270 Primary pulmonary hypertension: Secondary | ICD-10-CM

## 2014-02-11 DIAGNOSIS — I272 Pulmonary hypertension, unspecified: Secondary | ICD-10-CM

## 2014-02-11 DIAGNOSIS — Z1212 Encounter for screening for malignant neoplasm of rectum: Secondary | ICD-10-CM

## 2014-02-11 DIAGNOSIS — E785 Hyperlipidemia, unspecified: Secondary | ICD-10-CM

## 2014-02-11 DIAGNOSIS — Z1239 Encounter for other screening for malignant neoplasm of breast: Secondary | ICD-10-CM

## 2014-02-11 LAB — COMPLETE METABOLIC PANEL WITH GFR
ALBUMIN: 3.9 g/dL (ref 3.5–5.2)
ALT: 11 U/L (ref 0–35)
AST: 15 U/L (ref 0–37)
Alkaline Phosphatase: 77 U/L (ref 39–117)
BILIRUBIN TOTAL: 0.4 mg/dL (ref 0.2–1.2)
BUN: 30 mg/dL — AB (ref 6–23)
CALCIUM: 9.4 mg/dL (ref 8.4–10.5)
CO2: 25 mEq/L (ref 19–32)
Chloride: 103 mEq/L (ref 96–112)
Creat: 1.68 mg/dL — ABNORMAL HIGH (ref 0.50–1.10)
GFR, Est African American: 34 mL/min — ABNORMAL LOW
GFR, Est Non African American: 30 mL/min — ABNORMAL LOW
GLUCOSE: 223 mg/dL — AB (ref 70–99)
POTASSIUM: 4.9 meq/L (ref 3.5–5.3)
Sodium: 136 mEq/L (ref 135–145)
Total Protein: 7.4 g/dL (ref 6.0–8.3)

## 2014-02-11 LAB — HEMOGLOBIN A1C
HEMOGLOBIN A1C: 8.9 % — AB (ref <5.7)
Mean Plasma Glucose: 209 mg/dL — ABNORMAL HIGH (ref <117)

## 2014-02-11 NOTE — Progress Notes (Signed)
Patient ID: ZHIA SIU MRN: [id number], DOB: 13-May-1940, 74 y.o. Date of Encounter: @DATE @  Chief Complaint:  Chief Complaint  Patient presents with  . 3 mth check up    not fasting    HPI: 74 y.o. year old white female  presents for f/u OV.   I had been seeing her routinely in the past until March 2012. After that visit March 2012, I did not see her until 06/13/12. At that time, she told me that even though she had insurance, she did not have the money to pay her co-pay so she had not come in for any office visits during that time. She says that she only has to pay a $15 co-pay when she comes here. She says that her finances are better now. Says that she has paid off her son's truck and she no longer has that payment going out  every month.  When she came 06/13/12 she reported that she had  been continuing to use Lantus 15 units every morning through that entire period of time. She also reported that she had been seeing [facility] and they  were managing her blood pressure and cholesterol during that time.  She was hospitalized 07/19/12 through 07/25/12. She had appendiceal abscess and underwent surgery by Dr. Hulen Skains.   TODAY: Today she says she forgot to bring her notebook--where she documents her BP, Pulse, and BS readings.  However, says she continues to get good BP and Pulse Readings.  Also says her "sugars are real good."  Mostly checks fasting morning--gets 107-109, sometimes 90s.  Occasionally checks other times--"good readings then too, honey".  She has no complaints today.    Past Medical History  Diagnosis Date  . Diabetes mellitus   . Hypertension   . Hyperlipidemia   . Tricuspid regurgitation     Echo 05/02/06-Nml LV. Mild MR. Mod TR  . Osteoporosis   . Diastolic heart failure   . Vitamin D deficiency   . Osteoporosis   . CKD (chronic kidney disease) stage 3, GFR 30-59 ml/min 12/25/2012     Home Meds:  Outpatient Prescriptions Prior to Visit    Medication Sig Dispense Refill  . acetaminophen (TYLENOL) 500 MG tablet Take 500 mg by mouth every 6 (six) hours as needed for pain.    Marland Kitchen amLODipine (NORVASC) 10 MG tablet TAKE ONE TABLET BY MOUTH ONCE DAILY 90 tablet 3  . aspirin EC 81 MG tablet Take 81 mg by mouth daily.    . cholecalciferol (VITAMIN D) 1000 UNITS tablet Take 4,000 Units by mouth daily.     . cloNIDine (CATAPRES) 0.2 MG tablet Take 0.2 mg by mouth 2 (two) times daily.    . furosemide (LASIX) 40 MG tablet Take 1 tablet (40 mg total) by mouth daily. 30 tablet 5  . Insulin Detemir (LEVEMIR) 100 UNIT/ML Pen Inject 18 Units into the skin every morning.    . simvastatin (ZOCOR) 20 MG tablet TAKE ONE TABLET BY MOUTH ONCE DAILY AT BEDTIME 90 tablet 1  . amLODipine (NORVASC) 10 MG tablet Take 10 mg by mouth daily.    Marland Kitchen dextromethorphan-guaiFENesin (MUCINEX DM) 30-600 MG per 12 hr tablet Take 1 tablet by mouth 2 (two) times daily as needed for cough.    . traMADol (ULTRAM) 50 MG tablet Take 1 tablet (50 mg total) by mouth every 6 (six) hours as needed for moderate pain or severe pain. (Patient not taking: Reported on 02/11/2014) 30 tablet 0  No facility-administered medications prior to visit.     Allergies:  Allergies  Allergen Reactions  . Ace Inhibitors Other (See Comments)    Hyperkalemia  . Aspirin Other (See Comments)    G.I. Upset.   . Metformin And Related Diarrhea    History   Social History  . Marital Status: Widowed    Spouse Name: N/A    Number of Children: N/A  . Years of Education: N/A   Occupational History  . Not on file.   Social History Main Topics  . Smoking status: Former Smoker    Quit date: 01/19/2009  . Smokeless tobacco: Never Used  . Alcohol Use: No  . Drug Use: No  . Sexual Activity: Not on file   Other Topics Concern  . Not on file   Social History Narrative   Entered 10/2013:   She has never driven.   She lives with her son.    Family History  Problem Relation Age of Onset   . Cancer Father   . Diabetes Father   . Cancer Brother     Brain  . Cancer Sister     abdominal fat     Review of Systems:  See HPI for pertinent ROS. All other ROS negative.    Physical Exam: Blood pressure 132/90, pulse 60, temperature 98.3 F (36.8 C), temperature source Oral, resp. rate 18, weight 164 lb (74.39 kg)., Body mass index is 29.99 kg/(m^2). General: WNWD WF. Appears in no acute distress. Neck: Supple. No thyromegaly. No lymphadenopathy.No carotid bruits. Lungs: Clear bilaterally to auscultation without wheezes, rales, or rhonchi. Breathing is unlabored. Heart: RRR with S1 S2. No murmurs, rubs, or gallops. Abdomen: Soft, non-tender, non-distended with normoactive bowel sounds. No hepatomegaly. No rebound/guarding. No obvious abdominal masses. Musculoskeletal:  Strength and tone normal for age. Extremities/Skin: Warm and dry. No clubbing or cyanosis. No edema. No rashes or suspicious lesions. Neuro: Alert and oriented X 3. Moves all extremities spontaneously. Gait is normal. CNII-XII grossly in tact. Psych:  Responds to questions appropriately with a normal affect. Diabetic foot exam:  Inspection is normal. No open wounds or nonhealing sores. Sensation testing is normal. There are no palpable dorsalis pedis or posterior tibial pulses bilaterally.      ASSESSMENT AND PLAN:  74 y.o. year old female with  1. Diabetes type 2, uncontrolled  No longer on  ACE inhibitor therapy--secondary to h/o Hyperkalemia with this.  Also on no ARB sec to this. Meds changed during past hospitalization.  We checked lipid panel 12/16/12 started statin therapy. She says that she is taking the simvastatin as directed. She had f/u FLP/LFT 06/2013---Lipid Panel Excellent, with LDL 67.   She cannot take metformin secondary to past GI adverse effects and also now she has chronic kidney disease which prohibits use of this.   At [place] 12/14 I discussed with her that she needs to have an annual  diabetic eye exam.  Today she says she has this scheduled for Next Tuesday---"My son got that morning off work to take me that day."   At prior OV  I discussed proper foot care. Discussed the effects of the diabetes has on her blood vessels as bar as blood flow to her feet. Also discussed the effects it has on peripheral nerves. Discussed that she may have decreased sensation and not feel pain and sores as a normal person would. Therefore needs to wear shoes and be very protective of her feet. Also discussed proper care regarding trimming  her toenails etc. If she ever does see any small sores developing that she needs to come in for evaluation immediately.  Discussed need for proper exercise. She states she gets outside and walks whenever possible. She definitely does this when the weather is nice and says even if it is somewhat cold she still tries to get out of remain active.  We have discussed carbohydrate diet in the past that have been a long time. I reviewed a low carbohydrate diet handout with her and discussed details of specific examples of things to avoid as well as gave her examples of meals that she can eat.  LOV I was referral to ophthalmology for diabetic eye exam and glaucoma screening. Her visit on 02/11/14 she reported that she has appointment with the eye doctor "next Tuesday and so her son is going to take off that morning from work to take her to that appointment"  2. Hypertension  At prior visit with me I reviewed her last office note by cardiology dated 06/2012. They had performed a renal artery ultrasound which was negative. In their notes they documented that they reviewed the fact that she has labile blood pressure readings.  They also felt the patient had white coat hypertension.   She was on ACE Inhibitor, until hyperkalemia prohibited this.  At [place] 12/2012 I noted and did: Her heart rate is low so I do not think I should add a beta blocker. Bystolic is going to be  expensive. i will Increase her clonidine. She will stop using the patch and switch over to pill form. - cloNIDine (CATAPRES) 0.3 MG tablet; Take 1 tablet (0.3 mg total) by mouth 2 (two) times daily.  Dispense: 60 tablet; Refill: 5  3. CKD (chronic kidney disease) stage 3, GFR 30-59 ml/min In past, we have discussed need to keep BP and BS well controlled.   4. Hyperlipidemia On simvastatin 20 mg. Had f/u lab 06/2013--FLP excellent with LDL 67.  Told her to schedule her next OV early morning and COME FASTING.    5. Osteoporosis  See Note Under Preventive Care/DEXA below.   6. Vitamin D deficiency She had been taking 1000 units daily. At this check a level on 12/16/2012 and it was low. Told her to increase to 4000 units daily. At [place] 12/2012 she said that she forgot about making this change but will go home to start taking the 4000 units daily now.  7. Pulmonary hypertension  8. COPD (chronic obstructive pulmonary disease) Quit smoking 4 years ago.  9. H/O Normocytic anemia    11. Screening for colorectal cancer She states that the last colonoscopy was less than 10 years ago. At that time she was told she can repeat 10 years. Not due yet.  12. Immunizations: Pneumonia vaccine: She received the Pneumovax 11/30/09.   Prevnar 13 --given here 12/2012.  Tetanus:  We gave here 12/2012. Zostavax: Told her to contact her insurance company to find out her cost for this. If she wishes to proceed , she can call us and we can send an order in to the pharmacy for her to go there for the injection. Influenza vaccine: Was given here a 12/16/12.    74 y.o. y/o female here for CPE 1. Visit for preventive health examination  A. Screening Labs: She has had recent CBC, CMET. Her last lipid panel was 07/04/13 which showed triglycerides 70, HDL 58, LDL 67  B. [id number] 73.  C. Screening Mammogram: Last mammogram was 02/25/2013  D.  DEXA/BMD: She has been on Fosamax in the  past.  Will not do further bone density test as I would not do further treatment regardless of the test results. E. Colorectal Cancer Screening:  Last colonoscopy was by Dr. Benson Norway 08/13/2008 F. Immunizations:  Influenza: 09/2013 Tetanus: 12/2012 Pneumococcal: She received the Pneumovax 11/30/09. Prevnar 13 --given here 12/2012.  Zostavax: she defers.  1. Visit for preventive health examination Has had no screening for Glaucoma. Pt agreeable for me to refer.  - Ambulatory referral to Ophthalmology----placed this order at her prior visit. At her follow-up visit 02/11/14 she states that she has followed up she has an appointment scheduled with ophthalmology this upcoming Tuesday.  ROV 3 months, sooner if needed.   Signed, [address] [place], [place], [facility] 02/11/2014 2:22 PM

## 2014-02-16 ENCOUNTER — Other Ambulatory Visit: Payer: Self-pay | Admitting: Cardiology

## 2014-02-16 MED ORDER — CLONIDINE HCL 0.2 MG PO TABS: 0.2000 mg | ORAL_TABLET | Freq: Two times a day (BID) | ORAL | Status: DC

## 2014-02-16 NOTE — Telephone Encounter (Signed)
E-scribed rx complete

## 2014-02-16 NOTE — Telephone Encounter (Signed)
Received fax refill request  Rx # [id number] Medication:  Clonidine 0.2 mg tab Qty 60 Sig:  Take one tablet by mouth twice daily Physician:  Harl Bowie

## 2014-02-20 ENCOUNTER — Telehealth: Payer: Self-pay | Admitting: Family Medicine

## 2014-02-20 NOTE — Telephone Encounter (Signed)
Says sugars are running high in 200-300.  Goes up and down.  Stop her diabetic pills when left hosp.  Told when called with lab results to check blood sugars and return in two weeks with readings.  That appt is this Thursday.  Keep appt and provider will address at that time.

## 2014-02-25 ENCOUNTER — Encounter: Payer: Self-pay | Admitting: Physician Assistant

## 2014-02-25 ENCOUNTER — Ambulatory Visit (INDEPENDENT_AMBULATORY_CARE_PROVIDER_SITE_OTHER): Payer: Medicare HMO | Admitting: Physician Assistant

## 2014-02-25 VITALS — BP 170/80 | HR 76 | Temp 97.8°F | Resp 18 | Wt 160.0 lb

## 2014-02-25 DIAGNOSIS — IMO0002 Reserved for concepts with insufficient information to code with codable children: Secondary | ICD-10-CM

## 2014-02-25 DIAGNOSIS — E1165 Type 2 diabetes mellitus with hyperglycemia: Secondary | ICD-10-CM

## 2014-02-25 NOTE — Progress Notes (Signed)
Patient ID: Laura Best MRN: [id number], DOB: 11-Apr-1940, 74 y.o. Date of Encounter: 02/25/2014, 1:02 PM    Chief Complaint:  Chief Complaint  Patient presents with  . 2 mth follow up    is fasting, has brought sugar log, has not had todays meds yet     HPI: 74 y.o. year old white female is here to follow-up regarding blood sugars.  The following is copied from her last office visit note with me on 02/11/14:   "Today she says she forgot to bring her notebook--where she documents her BP, Pulse, and BS readings.  However, says she continues to get good BP and Pulse Readings.  Also says her "sugars are real good."  Mostly checks fasting morning--gets 107-109, sometimes 90s.  Occasionally checks other times--"good readings then too, honey". "  However that day her A1c reading came back 8.9. She was informed to check fasting blood sugars every morning and check prior to meals and 2 hours after meals some and document and bring to follow-up appointment.  Today she did bring these blood sugar readings.  For some reason, her fasting blood sugars had been well controlled the month of January-- on the times that she checked it-- up until January 26. Between January 6 to January 26 she had 11 sugars documented for fasting mornings. They were 113, 170, 150, 107, 102, 116, 119, 117, 139, 152, and 156.  Then for some reason all of a sudden on 1/28 fasting sugar reading was 263. Since then the daily morning fasting readings have been 244, 256, 225  Asked her if she has been on prednisone or any other new medication change that would be causing this and she says no.  She says that when she was in the hospital in September her Actos was stopped and ever since then she's just been on the Levemir but has had no other changes that should be affecting her sugar since then.  She also has a few readings at other times of day on her log. These readings are all high such as 235, 230, 269, 309.  295, 242, 263, 298, 195, 240.  She states that she is using Levemir 18 units every morning.    Home Meds:   Outpatient Prescriptions Prior to Visit  Medication Sig Dispense Refill  . acetaminophen (TYLENOL) 500 MG tablet Take 500 mg by mouth every 6 (six) hours as needed for pain.    Marland Kitchen amLODipine (NORVASC) 10 MG tablet TAKE ONE TABLET BY MOUTH ONCE DAILY 90 tablet 3  . aspirin EC 81 MG tablet Take 81 mg by mouth daily.    . cholecalciferol (VITAMIN D) 1000 UNITS tablet Take 4,000 Units by mouth daily.     . cloNIDine (CATAPRES) 0.2 MG tablet Take 1 tablet (0.2 mg total) by mouth 2 (two) times daily. 60 tablet 11  . furosemide (LASIX) 40 MG tablet Take 1 tablet (40 mg total) by mouth daily. 30 tablet 5  . Insulin Detemir (LEVEMIR) 100 UNIT/ML Pen Inject 22 Units into the skin every morning.    Marland Kitchen RELION SHORT PEN NEEDLES 31G X 8 MM MISC     . simvastatin (ZOCOR) 20 MG tablet TAKE ONE TABLET BY MOUTH ONCE DAILY AT BEDTIME 90 tablet 1   No facility-administered medications prior to visit.    Allergies:  Allergies  Allergen Reactions  . Ace Inhibitors Other (See Comments)    Hyperkalemia  . Aspirin Other (See Comments)    G.I. Upset.   Marland Kitchen  Metformin And Related Diarrhea      Review of Systems: See HPI for pertinent ROS. All other ROS negative.    Physical Exam: Blood pressure 170/80, pulse 76, temperature 97.8 F (36.6 C), temperature source Oral, resp. rate 18, weight 160 lb (72.576 kg)., Body mass index is 29.26 kg/(m^2). General:  WNWD WF. Appears in no acute distress. Neck: Supple. No thyromegaly. No lymphadenopathy. Lungs: Clear bilaterally to auscultation without wheezes, rales, or rhonchi. Breathing is unlabored. Heart: Regular rhythm. No murmurs, rubs, or gallops. Msk:  Strength and tone normal for age. Extremities/Skin: Warm and dry. Neuro: Alert and oriented X 3. Moves all extremities spontaneously. Gait is normal. CNII-XII grossly in tact. Psych:  Responds to  questions appropriately with a normal affect.     ASSESSMENT AND PLAN:  74 y.o. year old female with  1. Diabetes type 2, uncontrolled  Her Levemir is currently at 18 units each morning. She is going to increase Levemir to 22 units every morning. She is going to document fasting morning blood sugar readings. Monday she is going to call here and speak with Maudie Mercury and report her fasting morning blood sugar readings on the Levemir 22 units. I will then review and see if we need to further adjust the Levemir dose.  Will have her schedule follow-up office visit with me in 2 weeks also. Hopefully by that time we can have the Levemir dose adjusted to where the fasting sugars are controlled and then if we need to we can discuss adding mealtime insulin at that visit.  Today I did give her our blood sugar log forms which are easier for her to document and easier to read/ review.Marin Olp Bison, [place], [facility] 02/25/2014 1:02 PM

## 2014-02-26 ENCOUNTER — Ambulatory Visit: Payer: Self-pay | Admitting: Physician Assistant

## 2014-03-02 ENCOUNTER — Telehealth: Payer: Self-pay | Admitting: Family Medicine

## 2014-03-02 ENCOUNTER — Ambulatory Visit: Payer: Medicare HMO | Admitting: Cardiology

## 2014-03-02 NOTE — Telephone Encounter (Signed)
Pt calling with FBS readings from past five days.  Also reports that as of Friday evening she has started taking her daily Levemir in the evening.  FBS 2/4 211 FBS 2/5 228 FBS 2/6 109 FBS 2/7 130 FBS 2/8  96

## 2014-03-02 NOTE — Telephone Encounter (Signed)
Continue Current dose -- 22 units.  ---Call us if get any readings < 80 O/W--cont to check fasting BS QAM and also check 2 hours after meals---Document these on BS log sheet-- Bring this log sheet to f/u OV next week.  (Make sure has f/u OV with me next week)

## 2014-03-02 NOTE — Telephone Encounter (Signed)
Pt aware of provider recommendations.  Has appt for next Wednesday

## 2014-03-04 ENCOUNTER — Other Ambulatory Visit: Payer: Self-pay | Admitting: Physician Assistant

## 2014-03-04 NOTE — Telephone Encounter (Signed)
Medication refilled per protocol. 

## 2014-03-10 ENCOUNTER — Other Ambulatory Visit: Payer: Self-pay | Admitting: Physician Assistant

## 2014-03-10 DIAGNOSIS — E1165 Type 2 diabetes mellitus with hyperglycemia: Secondary | ICD-10-CM

## 2014-03-10 DIAGNOSIS — IMO0002 Reserved for concepts with insufficient information to code with codable children: Secondary | ICD-10-CM

## 2014-03-10 NOTE — Telephone Encounter (Signed)
Medication refilled per protocol. 

## 2014-03-11 ENCOUNTER — Ambulatory Visit: Payer: Medicare HMO | Admitting: Physician Assistant

## 2014-03-12 ENCOUNTER — Ambulatory Visit (INDEPENDENT_AMBULATORY_CARE_PROVIDER_SITE_OTHER): Payer: Medicare HMO | Admitting: Physician Assistant

## 2014-03-12 ENCOUNTER — Encounter: Payer: Self-pay | Admitting: Physician Assistant

## 2014-03-12 ENCOUNTER — Telehealth: Payer: Self-pay | Admitting: Family Medicine

## 2014-03-12 VITALS — BP 164/74 | HR 68 | Temp 98.3°F | Resp 18 | Wt 162.0 lb

## 2014-03-12 DIAGNOSIS — E1165 Type 2 diabetes mellitus with hyperglycemia: Secondary | ICD-10-CM

## 2014-03-12 DIAGNOSIS — IMO0002 Reserved for concepts with insufficient information to code with codable children: Secondary | ICD-10-CM

## 2014-03-12 MED ORDER — INSULIN LISPRO 100 UNIT/ML ~~LOC~~ SOLN: SUBCUTANEOUS | Status: DC

## 2014-03-12 MED ORDER — INSULIN ASPART 100 UNIT/ML FLEXPEN: 5.0000 [IU] | PEN_INJECTOR | Freq: Every day | SUBCUTANEOUS | Status: DC

## 2014-03-12 NOTE — Telephone Encounter (Signed)
Insurance does not cover Humalog.  Rx changed to formulary Novolog at same dose.  Pt called and made aware

## 2014-03-12 NOTE — Progress Notes (Signed)
Patient ID: Laura Best MRN: [id number], DOB: 05-Dec-1940, 74 y.o. Date of Encounter: 03/12/2014, 1:14 PM    Chief Complaint:  Chief Complaint  Patient presents with  . 2 week follow up     HPI: 75 y.o. year old female here for follow-up regarding her diabetes and blood sugar readings.  Her last office visit with me was 02/25/14. Prior to that visit she had been doing Levemir 18 units every morning. At that visit we had her increase to Levemir 22 units every morning. She was to document fasting morning blood sugar readings every morning. She was told to call here the following Monday morning and speak with Maudie Mercury to report fasting morning blood sugar readings on the Levemir 22 units.  She called and reported the following dates and fasting blood sugar readings.: 02/26/14-------211 02/27/14--------228 02/28/14------- 109 03/01/14------- 130 03/02/14-------- 96  At that time I told her to continue current dose of Levemir at 22 units. Told her to continue to check blood sugar fasting every morning but also to check some readings 2 hours postprandial. Told her to return for office visit in one week and bring blood sugar log with her.  Today she does return for follow-up visit. Continues to use Levemir 22 units. Brought in blood sugar log sheet.        Fasting morning readings are all 87-143.  2 hour after breakfast readings range from 163 02/25/1951.  She has 3 readings taken from before lunch and they will are 114, 142, 163.  She has 3 readings from 2 hours after lunch and they are 243, 117, 233, 249.  She has 4 readings from before dinner and they are 172, 284, 274, 204.  Readings 2 hours after dinner are 270, 303, 297, 216.  Bedtime readings are 300, 388, 413, 384, 410, 334, 334, 321, 408, 401      Home Meds:   Outpatient Prescriptions Prior to Visit  Medication Sig Dispense Refill  . acetaminophen (TYLENOL) 500 MG tablet Take 500 mg by mouth every 6 (six) hours as needed  for pain.    Marland Kitchen amLODipine (NORVASC) 10 MG tablet TAKE ONE TABLET BY MOUTH ONCE DAILY 90 tablet 3  . aspirin EC 81 MG tablet Take 81 mg by mouth daily.    . cholecalciferol (VITAMIN D) 1000 UNITS tablet Take 4,000 Units by mouth daily.     . cloNIDine (CATAPRES) 0.2 MG tablet Take 1 tablet (0.2 mg total) by mouth 2 (two) times daily. 60 tablet 11  . furosemide (LASIX) 40 MG tablet Take 1 tablet (40 mg total) by mouth daily. 30 tablet 5  . Insulin Detemir (LEVEMIR FLEXTOUCH) 100 UNIT/ML Pen Inject 22 Units into the skin daily at 10 pm. 15 mL 0  . RELION SHORT PEN NEEDLES 31G X 8 MM MISC USE ONE PEN NEEDLE ONCE DAILY AS DIRECTED 50 each 11  . simvastatin (ZOCOR) 20 MG tablet TAKE ONE TABLET BY MOUTH ONCE DAILY AT BEDTIME 90 tablet 1   No facility-administered medications prior to visit.    Allergies:  Allergies  Allergen Reactions  . Ace Inhibitors Other (See Comments)    Hyperkalemia  . Aspirin Other (See Comments)    G.I. Upset.   . Metformin And Related Diarrhea      Review of Systems: See HPI for pertinent ROS. All other ROS negative.    Physical Exam: Blood pressure 164/74, pulse 68, temperature 98.3 F (36.8 C), temperature source Oral, resp. rate 18, weight 162  lb (73.483 kg)., Body mass index is 29.62 kg/(m^2). General:  WNWD WF. Appears in no acute distress. Neck: Supple. No thyromegaly. No lymphadenopathy. Lungs: Clear bilaterally to auscultation without wheezes, rales, or rhonchi. Breathing is unlabored. Heart: Regular rhythm. No murmurs, rubs, or gallops. Msk:  Strength and tone normal for age. Extremities/Skin: Warm and dry. Neuro: Alert and oriented X 3. Moves all extremities spontaneously. Gait is normal. CNII-XII grossly in tact. Psych:  Responds to questions appropriately with a normal affect.     ASSESSMENT AND PLAN:  74 y.o. year old female with  1. Diabetes type 2, uncontrolled Told her that we will continue the Levemir at present dose of 22  units. Continue all current medications the same as they are.  Will add short-acting insulin to use at suppertime/dinnertime meal only. Told her to check blood sugar 2 hour after dinner and at bedtime on a daily basis. Call me if getting any low readings. - insulin lispro (HUMALOG) 100 UNIT/ML injection; 5 units at supper time/ dinner time----take within 15 minutes before OR immediately after meal  Dispense: 10 mL; Refill: [address] [place], [place], [facility] 03/12/2014 1:14 PM

## 2014-03-12 NOTE — Telephone Encounter (Signed)
Pharmacy has started prior auth for pt's Humalog insulin.

## 2014-03-30 ENCOUNTER — Encounter: Payer: Self-pay | Admitting: Adult Health

## 2014-03-30 ENCOUNTER — Ambulatory Visit (INDEPENDENT_AMBULATORY_CARE_PROVIDER_SITE_OTHER): Payer: Medicare HMO | Admitting: Adult Health

## 2014-03-30 VITALS — BP 160/70 | HR 65 | Ht 63.0 in | Wt 163.0 lb

## 2014-03-30 DIAGNOSIS — I5032 Chronic diastolic (congestive) heart failure: Secondary | ICD-10-CM

## 2014-03-30 DIAGNOSIS — I10 Essential (primary) hypertension: Secondary | ICD-10-CM

## 2014-03-30 NOTE — Patient Instructions (Signed)
Your physician wants you to follow-up in: 6 months You will receive a reminder letter in the mail two months in advance. If you don't receive a letter, please call our office to schedule the follow-up appointment.     Your physician recommends that you continue on your current medications as directed. Please refer to the Current Medication list given to you today.      Thank you for choosing [facility] !        

## 2014-03-30 NOTE — Progress Notes (Signed)
Cardiology Office Note   Date:  03/30/2014   ID:  [name], DOB 28-Nov-1940, MRN [id number]  PCP:  [name], PA-C  Cardiologist:  [name], NP    Chief Complaint  Patient presents with  . Congestive Heart Failure  . Hypertension      History of Present Illness: Laura Best is a 74 y.o. female who presents for ongoing assessment and management of chronic diastolic heart failure, hypertension, with history of bradycardia.the patient was last seen in the office on 03/17/2013.  On last office visit.  Her blood pressure is well-controlled, but her heart rate was in the 40s and 50s asymptomatically.  At the time of the office visit.  She wished to stop wearing oxygen, which she was wearing chronically for management of dyspnea.  No changes were made in her medication regimen.  She was stable from a cardiac standpoint.  She was to followup in 4 months.  She is without cardiac complaints today.  She states she had an appendectomy in September 2015, she was also taken off of Actos for her glucose control, and placed on insulin subcutaneously.  She denies any recurrent discomfort in her chest.  Fluid retention or abdominal pain.  She is medically compliant.  She does have white coat syndrome    Past Medical History  Diagnosis Date  . Diabetes mellitus   . Hypertension   . Hyperlipidemia   . Tricuspid regurgitation     Echo 05/02/06-Nml LV. Mild MR. Mod TR  . Osteoporosis   . Diastolic heart failure   . Vitamin D deficiency   . Osteoporosis   . CKD (chronic kidney disease) stage 3, GFR 30-59 ml/min 12/25/2012    Past Surgical History  Procedure Laterality Date  . Heart chamber revision      patched hole --1993  . Tubal ligation    . Laparoscopic appendectomy N/A 09/27/2013    Procedure: APPENDECTOMY - CONVERTED FROM LAPAROSCOPIC;  Surgeon: Donnie Mesa, MD;  Location: [facility];  Service: General;  Laterality: N/A;     Current Outpatient Prescriptions   Medication Sig Dispense Refill  . acetaminophen (TYLENOL) 500 MG tablet Take 500 mg by mouth every 6 (six) hours as needed for pain.    Marland Kitchen amLODipine (NORVASC) 10 MG tablet TAKE ONE TABLET BY MOUTH ONCE DAILY 90 tablet 3  . aspirin EC 81 MG tablet Take 81 mg by mouth daily.    . cholecalciferol (VITAMIN D) 1000 UNITS tablet Take 4,000 Units by mouth daily.     . cloNIDine (CATAPRES) 0.2 MG tablet Take 1 tablet (0.2 mg total) by mouth 2 (two) times daily. 60 tablet 11  . furosemide (LASIX) 40 MG tablet Take 1 tablet (40 mg total) by mouth daily. 30 tablet 5  . insulin aspart (NOVOLOG) 100 UNIT/ML FlexPen Inject 5 Units into the skin daily before supper. Take 15 min before supper or immediately after eating supper 15 mL 11  . Insulin Detemir (LEVEMIR FLEXTOUCH) 100 UNIT/ML Pen Inject 22 Units into the skin daily at 10 pm. 15 mL 0  . RELION SHORT PEN NEEDLES 31G X 8 MM MISC USE ONE PEN NEEDLE ONCE DAILY AS DIRECTED 50 each 11  . simvastatin (ZOCOR) 20 MG tablet TAKE ONE TABLET BY MOUTH ONCE DAILY AT BEDTIME 90 tablet 1   No current facility-administered medications for this visit.    Allergies:   Ace inhibitors; Aspirin; and Metformin and related    Social History:  The patient  reports that she quit smoking about 5 years ago. She has never used smokeless tobacco. She reports that she does not drink alcohol or use illicit drugs.   Family History:  The patient's family history includes Cancer in her brother, father, and sister; Diabetes in her father.    ROS: .   All other systems are reviewed and negative.Unless otherwise mentioned in  H&P above.   PHYSICAL EXAM: VS:  There were no vitals taken for this visit. , BMI There is no weight on file to calculate BMI. GEN: Well nourished, well developed, in no acute distress HEENT: normal Neck: no JVD, carotid bruits, or masses Cardiac: 1/6 systolic murmur. RRR; no murmurs, rubs, or gallops,no edema  Respiratory:  clear to auscultation  bilaterally, normal work of breathing GI: soft, nontender, nondistended, + BS, well-healed midline scar. MS: no deformity or atrophy Skin: warm and dry, no rash Neuro:  Strength and sensation are intact Psych: euthymic mood, full affect   Recent Labs: 10/01/2013: Hemoglobin 8.3*; Platelets 179 02/11/2014: ALT 11; BUN 30*; Creatinine 1.68*; Potassium 4.9; Sodium 136    Lipid Panel    Component Value Date/Time   CHOL 139 07/04/2013 0826   TRIG 70 07/04/2013 0826   HDL 58 07/04/2013 0826   CHOLHDL 2.4 07/04/2013 0826   VLDL 14 07/04/2013 0826   LDLCALC 67 07/04/2013 0826      Wt Readings from Last 3 Encounters:  03/12/14 162 lb (73.483 kg)  02/25/14 160 lb (72.576 kg)  02/11/14 164 lb (74.39 kg)     ASSESSMENT AND PLAN:  1.  Chronic diastolic CHF: Well compensated without evidence of fluid retention.  No changes in her medication.most recent echocardiogram was completed in December 2014, with normal LVEF, mild LVH with grade 2 diastolic dysfunction.  She will remain on Lasix.  Labs are followed by primary care provider  2.Hypertension:blood pressure slightly elevated today.  It is normally lower at home.  She does have whitecoat syndrome.  She states that when she takes her blood pressures in the "130s "on the top.  Number, and "70s, "on the bottom number.  I will not make any changes on her medication regimen.  She is allergic to ACE inhibitors   Current medicines are reviewed at length with the patient today.  Disposition:   FU with 6 months  Signed, Jory Sims, NP  03/30/2014 7:04 AM    [facility] [address] [place], [place] [address] Phone: [phone]; Fax: [phone]

## 2014-03-30 NOTE — Progress Notes (Deleted)
Name: Laura Best    DOB: 1940-03-26  Age: 74 y.o.  MR#: [id number]       PCP:  [name], PA-C      Insurance: Payor: [employer]alling MEDICARE / Plan: [employer] MEDICARE HMO/PPO / Product Type: *No Product type* /   CC:    Chief Complaint  Patient presents with  . Congestive Heart Failure  . Hypertension    VS Filed Vitals:   03/30/14 1339  BP: 160/70  Pulse: 65  Height: 5\' 3"  (1.6 m)  Weight: 163 lb (73.936 kg)  SpO2: 98%    Weights Current Weight  03/30/14 163 lb (73.936 kg)  03/12/14 162 lb (73.483 kg)  02/25/14 160 lb (72.576 kg)    Blood Pressure  BP Readings from Last 3 Encounters:  03/30/14 160/70  03/12/14 164/74  02/25/14 170/80     Admit date:  (Not on file) Last encounter with RMR:  01/05/2014   Allergy Ace inhibitors; Aspirin; and Metformin and related  Current Outpatient Prescriptions  Medication Sig Dispense Refill  . acetaminophen (TYLENOL) 500 MG tablet Take 500 mg by mouth every 6 (six) hours as needed for pain.    Marland Kitchen amLODipine (NORVASC) 10 MG tablet TAKE ONE TABLET BY MOUTH ONCE DAILY 90 tablet 3  . aspirin EC 81 MG tablet Take 81 mg by mouth daily.    . cholecalciferol (VITAMIN D) 1000 UNITS tablet Take 4,000 Units by mouth daily.     . cloNIDine (CATAPRES) 0.2 MG tablet Take 1 tablet (0.2 mg total) by mouth 2 (two) times daily. 60 tablet 11  . furosemide (LASIX) 40 MG tablet Take 1 tablet (40 mg total) by mouth daily. 30 tablet 5  . insulin aspart (NOVOLOG) 100 UNIT/ML FlexPen Inject 5 Units into the skin daily before supper. Take 15 min before supper or immediately after eating supper 15 mL 11  . Insulin Detemir (LEVEMIR FLEXTOUCH) 100 UNIT/ML Pen Inject 22 Units into the skin daily at 10 pm. 15 mL 0  . RELION SHORT PEN NEEDLES 31G X 8 MM MISC USE ONE PEN NEEDLE ONCE DAILY AS DIRECTED 50 each 11  . simvastatin (ZOCOR) 20 MG tablet TAKE ONE TABLET BY MOUTH ONCE DAILY AT BEDTIME 90 tablet 1   No current facility-administered medications for this visit.     Discontinued Meds:   There are no discontinued medications.  Patient Active Problem List   Diagnosis Date Noted  . Acute appendicitis with peritoneal abscess 09/27/2013  . Bradycardia 04/02/2013  . Hyperkalemia 01/22/2013  . Acute renal failure 01/21/2013  . Anemia of chronic disease 01/21/2013  . Acute bronchitis 01/20/2013  . CHF exacerbation 01/19/2013  . Congestive heart disease 01/19/2013  . Acute diastolic CHF (congestive heart failure) 01/19/2013  . CKD (chronic kidney disease) stage 3, GFR 30-59 ml/min 12/25/2012  . Screening for colorectal cancer 12/25/2012  . Breast cancer screening 12/25/2012  . Vitamin D deficiency   . Osteoporosis   . Postop check 08/13/2012  . Appendiceal abscess 07/30/2012  . Fever, unspecified 07/19/2012  . Abdominal  pain, other specified site 07/19/2012  . SOB (shortness of breath) 07/19/2012  . ARF (acute renal failure) 07/19/2012  . Abscess, appendix 07/19/2012  . Acute diastolic heart failure [id number]  . Diabetes type 2, uncontrolled 02/02/2011  . Pulmonary hypertension 02/02/2011  . COPD exacerbation 02/02/2011  . Hypokalemia 02/02/2011  . Normocytic anemia 02/02/2011  . Hypertension   . Hyperlipidemia     LABS    Component Value Date/Time  NA 136 02/11/2014 1118   NA 140 10/03/2013 0500   NA 137 10/01/2013 0520   K 4.9 02/11/2014 1118   K 4.2 10/03/2013 0500   K 3.9 10/01/2013 0520   CL 103 02/11/2014 1118   CL 104 10/03/2013 0500   CL 101 10/01/2013 0520   CO2 25 02/11/2014 1118   CO2 22 10/03/2013 0500   CO2 23 10/01/2013 0520   GLUCOSE 223* 02/11/2014 1118   GLUCOSE 87 10/03/2013 0500   GLUCOSE 158* 10/01/2013 0520   GLUCOSE 163* 07/19/2012 1005   GLUCOSE 181* 06/13/2012 1129   BUN 30* 02/11/2014 1118   BUN 30* 10/03/2013 0500   BUN 33* 10/01/2013 0520   CREATININE 1.68* 02/11/2014 1118   CREATININE 1.82* 10/03/2013 0500   CREATININE 1.86* 10/01/2013 0520   CREATININE 1.96* 09/30/2013 0530   CREATININE  1.83* 07/04/2013 0826   CREATININE 1.54* 12/16/2012 1122   CALCIUM 9.4 02/11/2014 1118   CALCIUM 8.1* 10/03/2013 0500   CALCIUM 7.6* 10/01/2013 0520   GFRNONAA 30* 02/11/2014 1118   GFRNONAA 26* 10/03/2013 0500   GFRNONAA 26* 10/01/2013 0520   GFRNONAA 24* 09/30/2013 0530   GFRNONAA 27* 07/04/2013 0826   GFRNONAA 33* 12/16/2012 1122   GFRAA 34* 02/11/2014 1118   GFRAA 31* 10/03/2013 0500   GFRAA 30* 10/01/2013 0520   GFRAA 28* 09/30/2013 0530   GFRAA 31* 07/04/2013 0826   GFRAA 39* 12/16/2012 1122   CMP     Component Value Date/Time   NA 136 02/11/2014 1118   K 4.9 02/11/2014 1118   CL 103 02/11/2014 1118   CO2 25 02/11/2014 1118   GLUCOSE 223* 02/11/2014 1118   GLUCOSE 163* 07/19/2012 1005   BUN 30* 02/11/2014 1118   CREATININE 1.68* 02/11/2014 1118   CREATININE 1.82* 10/03/2013 0500   CALCIUM 9.4 02/11/2014 1118   PROT 7.4 02/11/2014 1118   ALBUMIN 3.9 02/11/2014 1118   AST 15 02/11/2014 1118   ALT 11 02/11/2014 1118   ALKPHOS 77 02/11/2014 1118   BILITOT 0.4 02/11/2014 1118   GFRNONAA 30* 02/11/2014 1118   GFRNONAA 26* 10/03/2013 0500   GFRAA 34* 02/11/2014 1118   GFRAA 31* 10/03/2013 0500       Component Value Date/Time   WBC 6.2 10/01/2013 0520   WBC 8.6 09/29/2013 0208   WBC 15.2* 09/28/2013 0235   HGB 8.3* 10/01/2013 0520   HGB 8.2* 09/29/2013 0208   HGB 10.0* 09/28/2013 0235   HCT 26.0* 10/01/2013 0520   HCT 25.5* 09/29/2013 0208   HCT 30.7* 09/28/2013 0235   MCV 85.0 10/01/2013 0520   MCV 89.5 09/29/2013 0208   MCV 87.0 09/28/2013 0235    Lipid Panel     Component Value Date/Time   CHOL 139 07/04/2013 0826   TRIG 70 07/04/2013 0826   HDL 58 07/04/2013 0826   CHOLHDL 2.4 07/04/2013 0826   VLDL 14 07/04/2013 0826   LDLCALC 67 07/04/2013 0826    ABG    Component Value Date/Time   TCO2 22 12/24/2008 2235     Lab Results  Component Value Date   TSH 1.316 01/19/2013   BNP (last 3 results) No results for input(s): BNP in the last 8760  hours.  ProBNP (last 3 results) No results for input(s): PROBNP in the last 8760 hours.  Cardiac Panel (last 3 results) No results for input(s): CKTOTAL, CKMB, TROPONINI, RELINDX in the last 72 hours.  Iron/TIBC/Ferritin/ %Sat    Component Value Date/Time   IRON 25* 01/19/2013  0830   TIBC 233* 01/19/2013 0830   FERRITIN 301* 01/19/2013 0830   IRONPCTSAT 11* 01/19/2013 0830     EKG Orders placed or performed during the hospital encounter of 09/27/13  . ED EKG  . ED EKG  . EKG  . EKG     Prior Assessment and Plan Problem List as of 03/30/2014      Cardiovascular and Mediastinum   Hypertension   Last Assessment & Plan 03/17/2013 Office Visit Written 03/17/2013  3:50 PM by Lendon Colonel, NP    Blood pressure at home is running within normal limits and parameters. I will continue her on the clonidine 0.2 mg twice a day for now. Heart rate is in the high 40s and 50s. Her pulse is in the office is 75. She denies any dizziness or presyncope. She is put on a lot of weight which is not related to fluid. She is to needing better. She has been started on new insulin and she states is making her hungry. She is avoiding salt. She will followup with Dr. Harl Bowie in 4 months. Of course she can come sooner if she is having symptoms.      Pulmonary hypertension   Acute diastolic heart failure   Last Assessment & Plan 01/09/2013 Office Visit Written 01/09/2013  2:07 PM by Lendon Colonel, NP    No evidence of fluid retention or decompensation. Continue current treatment plan. Follow up with new cardiologist on next visit.      CHF exacerbation   Last Assessment & Plan 03/17/2013 Office Visit Written 03/17/2013  3:53 PM by Lendon Colonel, NP    No evidence of heart failure, fluid retention, or symptoms thereof. She has gained some weight, but I do not find this to be fluid related. She states she has been eating more with her new diabetic medication causing her to feel hungry. She is also  eating healthier. Have asked her to please continue to weigh  daily on a digital scale and record as she does her blood pressure.      Congestive heart disease   Acute diastolic CHF (congestive heart failure)     Respiratory   COPD exacerbation   Last Assessment & Plan 02/20/2011 Office Visit Written 02/20/2011 12:27 PM by Lendon Colonel, NP    Breathing status is stable. No excessive DOE.      Acute bronchitis     Digestive   Abscess, appendix   Appendiceal abscess   Acute appendicitis with peritoneal abscess     Musculoskeletal and Integument   Osteoporosis     Genitourinary   ARF (acute renal failure)   Last Assessment & Plan 07/19/2012 Office Visit Written 07/19/2012  2:47 PM by Alycia Rossetti, MD    Acute renal failure noted on labs 3 days ago this will need to be repeated to see if this has worsened      CKD (chronic kidney disease) stage 3, GFR 30-59 ml/min   Acute renal failure     Other   Hyperlipidemia   Last Assessment & Plan 03/13/2012 Office Visit Written 03/13/2012  2:14 PM by Lendon Colonel, NP    She is not on a statin at this time. She refuses statin therapy at this time, but is willing to try this again if the other medications do not cause stomach upset. Will restart medications a little at a time. She needs coaxing to be compliant.      Diabetes type 2, uncontrolled  Last Assessment & Plan 03/13/2012 Office Visit Written 03/13/2012  2:16 PM by Lendon Colonel, NP    She is advised to see PCP for reinstitution of insulin. She verbalizes understanding.      Hypokalemia   Normocytic anemia   Fever, unspecified   Last Assessment & Plan 07/19/2012 Office Visit Written 07/19/2012  2:47 PM by Alycia Rossetti, MD    Patient has some type of infection brewing for white count in the office was 19,000 this is up from 16,003 days ago. Differentials include pneumonia versus diverticulitis or other abdominal infection. Send to emergency room for repeat chest  x-ray CT scan IV fluids.      Abdominal  pain, other specified site   Last Assessment & Plan 07/19/2012 Office Visit Written 07/19/2012  2:47 PM by Alycia Rossetti, MD    Per above, send to ER for further work-up , CT scan      SOB (shortness of breath)   Last Assessment & Plan 03/17/2013 Office Visit Written 03/17/2013  3:52 PM by Lendon Colonel, NP    She wishes to stop her oxygen at home. She states that her breathing status is fine she does like it short of breath and would like them to come and pick it up. I checked her oxygen saturation here in the office and it was 98%. If she is not using it or wearing it, I have advised her to call them and have a oxygen provider to pick it up.       Postop check   Vitamin D deficiency   Screening for colorectal cancer   Breast cancer screening   Anemia of chronic disease   Hyperkalemia   Bradycardia       Imaging: No results found.

## 2014-05-03 ENCOUNTER — Encounter (HOSPITAL_COMMUNITY): Payer: Self-pay | Admitting: Emergency Medicine

## 2014-05-03 ENCOUNTER — Inpatient Hospital Stay (HOSPITAL_COMMUNITY)
Admission: EM | Admit: 2014-05-03 | Discharge: 2014-05-08 | DRG: 392 | Disposition: A | Discharge status: Home / Self Care | Payer: Medicare HMO | Attending: Internal Medicine | Admitting: Internal Medicine

## 2014-05-03 ENCOUNTER — Emergency Department (HOSPITAL_COMMUNITY): Payer: Medicare HMO

## 2014-05-03 DIAGNOSIS — E1165 Type 2 diabetes mellitus with hyperglycemia: Secondary | ICD-10-CM | POA: Diagnosis present

## 2014-05-03 DIAGNOSIS — Z7982 Long term (current) use of aspirin: Secondary | ICD-10-CM

## 2014-05-03 DIAGNOSIS — K521 Toxic gastroenteritis and colitis: Secondary | ICD-10-CM | POA: Diagnosis present

## 2014-05-03 DIAGNOSIS — M810 Age-related osteoporosis without current pathological fracture: Secondary | ICD-10-CM | POA: Diagnosis present

## 2014-05-03 DIAGNOSIS — I129 Hypertensive chronic kidney disease with stage 1 through stage 4 chronic kidney disease, or unspecified chronic kidney disease: Secondary | ICD-10-CM | POA: Diagnosis present

## 2014-05-03 DIAGNOSIS — Z794 Long term (current) use of insulin: Secondary | ICD-10-CM

## 2014-05-03 DIAGNOSIS — N12 Tubulo-interstitial nephritis, not specified as acute or chronic: Secondary | ICD-10-CM | POA: Diagnosis present

## 2014-05-03 DIAGNOSIS — K5792 Diverticulitis of intestine, part unspecified, without perforation or abscess without bleeding: Secondary | ICD-10-CM | POA: Diagnosis present

## 2014-05-03 DIAGNOSIS — Z7901 Long term (current) use of anticoagulants: Secondary | ICD-10-CM | POA: Diagnosis not present

## 2014-05-03 DIAGNOSIS — I5032 Chronic diastolic (congestive) heart failure: Secondary | ICD-10-CM | POA: Diagnosis present

## 2014-05-03 DIAGNOSIS — I272 Other secondary pulmonary hypertension: Secondary | ICD-10-CM | POA: Diagnosis present

## 2014-05-03 DIAGNOSIS — E785 Hyperlipidemia, unspecified: Secondary | ICD-10-CM | POA: Diagnosis present

## 2014-05-03 DIAGNOSIS — K5900 Constipation, unspecified: Secondary | ICD-10-CM | POA: Diagnosis present

## 2014-05-03 DIAGNOSIS — E559 Vitamin D deficiency, unspecified: Secondary | ICD-10-CM | POA: Diagnosis present

## 2014-05-03 DIAGNOSIS — K5732 Diverticulitis of large intestine without perforation or abscess without bleeding: Secondary | ICD-10-CM | POA: Diagnosis not present

## 2014-05-03 DIAGNOSIS — E876 Hypokalemia: Secondary | ICD-10-CM | POA: Diagnosis present

## 2014-05-03 DIAGNOSIS — R109 Unspecified abdominal pain: Secondary | ICD-10-CM

## 2014-05-03 DIAGNOSIS — Z888 Allergy status to other drugs, medicaments and biological substances status: Secondary | ICD-10-CM

## 2014-05-03 DIAGNOSIS — I10 Essential (primary) hypertension: Secondary | ICD-10-CM | POA: Diagnosis present

## 2014-05-03 DIAGNOSIS — N183 Chronic kidney disease, stage 3 unspecified: Secondary | ICD-10-CM | POA: Diagnosis present

## 2014-05-03 DIAGNOSIS — Z87891 Personal history of nicotine dependence: Secondary | ICD-10-CM | POA: Diagnosis not present

## 2014-05-03 DIAGNOSIS — Z886 Allergy status to analgesic agent status: Secondary | ICD-10-CM

## 2014-05-03 DIAGNOSIS — T3695XA Adverse effect of unspecified systemic antibiotic, initial encounter: Secondary | ICD-10-CM | POA: Diagnosis present

## 2014-05-03 DIAGNOSIS — I5030 Unspecified diastolic (congestive) heart failure: Secondary | ICD-10-CM | POA: Diagnosis not present

## 2014-05-03 DIAGNOSIS — E1121 Type 2 diabetes mellitus with diabetic nephropathy: Secondary | ICD-10-CM | POA: Diagnosis present

## 2014-05-03 LAB — CBC WITH DIFFERENTIAL/PLATELET
Basophils Absolute: 0.1 10*3/uL (ref 0.0–0.1)
Basophils Relative: 1 % (ref 0–1)
Eosinophils Absolute: 0.2 10*3/uL (ref 0.0–0.7)
Eosinophils Relative: 2 % (ref 0–5)
HCT: 36.6 % (ref 36.0–46.0)
HEMOGLOBIN: 12.2 g/dL (ref 12.0–15.0)
Lymphocytes Relative: 16 % (ref 12–46)
Lymphs Abs: 1.3 10*3/uL (ref 0.7–4.0)
MCH: 27.7 pg (ref 26.0–34.0)
MCHC: 33.3 g/dL (ref 30.0–36.0)
MCV: 83 fL (ref 78.0–100.0)
Monocytes Absolute: 0.8 10*3/uL (ref 0.1–1.0)
Monocytes Relative: 10 % (ref 3–12)
Neutro Abs: 6 10*3/uL (ref 1.7–7.7)
Neutrophils Relative %: 71 % (ref 43–77)
PLATELETS: 191 10*3/uL (ref 150–400)
RBC: 4.41 MIL/uL (ref 3.87–5.11)
RDW: 14.4 % (ref 11.5–15.5)
WBC: 8.4 10*3/uL (ref 4.0–10.5)

## 2014-05-03 LAB — URINALYSIS, ROUTINE W REFLEX MICROSCOPIC
BILIRUBIN URINE: NEGATIVE
Glucose, UA: 100 mg/dL — AB
Ketones, ur: NEGATIVE mg/dL
Nitrite: POSITIVE — AB
PH: 6 (ref 5.0–8.0)
Protein, ur: 100 mg/dL — AB
SPECIFIC GRAVITY, URINE: 1.015 (ref 1.005–1.030)
Urobilinogen, UA: 0.2 mg/dL (ref 0.0–1.0)

## 2014-05-03 LAB — COMPREHENSIVE METABOLIC PANEL
ALT: 14 U/L (ref 0–35)
AST: 17 U/L (ref 0–37)
Albumin: 3.8 g/dL (ref 3.5–5.2)
Alkaline Phosphatase: 75 U/L (ref 39–117)
Anion gap: 9 (ref 5–15)
BUN: 18 mg/dL (ref 6–23)
CHLORIDE: 103 mmol/L (ref 96–112)
CO2: 26 mmol/L (ref 19–32)
Calcium: 9.3 mg/dL (ref 8.4–10.5)
Creatinine, Ser: 1.6 mg/dL — ABNORMAL HIGH (ref 0.50–1.10)
GFR calc Af Amer: 36 mL/min — ABNORMAL LOW (ref >90)
GFR calc non Af Amer: 31 mL/min — ABNORMAL LOW (ref >90)
Glucose, Bld: 211 mg/dL — ABNORMAL HIGH (ref 70–99)
Potassium: 3.5 mmol/L (ref 3.5–5.1)
SODIUM: 138 mmol/L (ref 135–145)
Total Bilirubin: 0.4 mg/dL (ref 0.3–1.2)
Total Protein: 7.5 g/dL (ref 6.0–8.3)

## 2014-05-03 LAB — CBG MONITORING, ED: GLUCOSE-CAPILLARY: 187 mg/dL — AB (ref 70–99)

## 2014-05-03 LAB — GLUCOSE, CAPILLARY
GLUCOSE-CAPILLARY: 230 mg/dL — AB (ref 70–99)
GLUCOSE-CAPILLARY: 229 mg/dL — AB (ref 70–99)
Glucose-Capillary: 249 mg/dL — ABNORMAL HIGH (ref 70–99)
Glucose-Capillary: 336 mg/dL — ABNORMAL HIGH (ref 70–99)

## 2014-05-03 LAB — URINE MICROSCOPIC-ADD ON

## 2014-05-03 LAB — LIPASE, BLOOD: LIPASE: 28 U/L (ref 11–59)

## 2014-05-03 MED ORDER — IOHEXOL 300 MG/ML  SOLN
25.0000 mL | Freq: Once | INTRAMUSCULAR | Status: AC | PRN
  Administered 2014-05-03: 25 mL via ORAL

## 2014-05-03 MED ORDER — PROMETHAZINE HCL 25 MG/ML IJ SOLN
12.5000 mg | INTRAMUSCULAR | Status: DC | PRN
  Administered 2014-05-03: 12.5 mg via INTRAVENOUS
  Filled 2014-05-03: qty 1

## 2014-05-03 MED ORDER — ONDANSETRON HCL 4 MG/2ML IJ SOLN
4.0000 mg | Freq: Once | INTRAMUSCULAR | Status: AC
  Administered 2014-05-03: 4 mg via INTRAVENOUS
  Filled 2014-05-03: qty 2

## 2014-05-03 MED ORDER — METRONIDAZOLE IN NACL 5-0.79 MG/ML-% IV SOLN
500.0000 mg | Freq: Once | INTRAVENOUS | Status: AC
  Administered 2014-05-03: 500 mg via INTRAVENOUS
  Filled 2014-05-03: qty 100

## 2014-05-03 MED ORDER — DEXTROSE 5 % IV SOLN
1.0000 g | Freq: Once | INTRAVENOUS | Status: AC
  Administered 2014-05-03: 1 g via INTRAVENOUS
  Filled 2014-05-03: qty 10

## 2014-05-03 MED ORDER — SODIUM CHLORIDE 0.9 % IV SOLN
INTRAVENOUS | Status: AC
  Administered 2014-05-03: 20:00:00 via INTRAVENOUS

## 2014-05-03 MED ORDER — PROMETHAZINE HCL 25 MG/ML IJ SOLN
6.2500 mg | INTRAMUSCULAR | Status: DC | PRN
  Administered 2014-05-03 – 2014-05-04 (×2): 6.25 mg via INTRAVENOUS
  Filled 2014-05-03 (×2): qty 1

## 2014-05-03 MED ORDER — SODIUM CHLORIDE 0.9 % IV BOLUS (SEPSIS)
500.0000 mL | Freq: Once | INTRAVENOUS | Status: AC
  Administered 2014-05-03: 500 mL via INTRAVENOUS

## 2014-05-03 MED ORDER — HEPARIN SODIUM (PORCINE) 5000 UNIT/ML IJ SOLN
5000.0000 [IU] | Freq: Three times a day (TID) | INTRAMUSCULAR | Status: DC
  Administered 2014-05-03 – 2014-05-08 (×13): 5000 [IU] via SUBCUTANEOUS
  Filled 2014-05-03 (×17): qty 1

## 2014-05-03 MED ORDER — CIPROFLOXACIN IN D5W 400 MG/200ML IV SOLN
400.0000 mg | Freq: Once | INTRAVENOUS | Status: AC
  Administered 2014-05-03: 400 mg via INTRAVENOUS
  Filled 2014-05-03: qty 200

## 2014-05-03 MED ORDER — ONDANSETRON HCL 4 MG/2ML IJ SOLN
4.0000 mg | Freq: Once | INTRAMUSCULAR | Status: AC
  Administered 2014-05-04: 4 mg via INTRAVENOUS
  Filled 2014-05-03: qty 2

## 2014-05-03 MED ORDER — CIPROFLOXACIN IN D5W 400 MG/200ML IV SOLN: 400.0000 mg | Freq: Two times a day (BID) | INTRAVENOUS | Status: DC

## 2014-05-03 MED ORDER — INSULIN ASPART 100 UNIT/ML ~~LOC~~ SOLN
0.0000 [IU] | Freq: Four times a day (QID) | SUBCUTANEOUS | Status: DC
  Administered 2014-05-03: 3 [IU] via SUBCUTANEOUS
  Administered 2014-05-03: 7 [IU] via SUBCUTANEOUS
  Administered 2014-05-04 (×3): 5 [IU] via SUBCUTANEOUS
  Administered 2014-05-04: 7 [IU] via SUBCUTANEOUS
  Administered 2014-05-05 (×4): 3 [IU] via SUBCUTANEOUS

## 2014-05-03 MED ORDER — ONDANSETRON HCL 4 MG/2ML IJ SOLN
4.0000 mg | Freq: Four times a day (QID) | INTRAMUSCULAR | Status: DC | PRN
  Administered 2014-05-03: 4 mg via INTRAVENOUS
  Filled 2014-05-03: qty 2

## 2014-05-03 MED ORDER — FENTANYL CITRATE 0.05 MG/ML IJ SOLN
50.0000 ug | Freq: Once | INTRAMUSCULAR | Status: AC
  Administered 2014-05-03: 50 ug via INTRAVENOUS
  Filled 2014-05-03: qty 2

## 2014-05-03 MED ORDER — ONDANSETRON HCL 4 MG PO TABS: 4.0000 mg | ORAL_TABLET | Freq: Four times a day (QID) | ORAL | Status: DC | PRN

## 2014-05-03 MED ORDER — PROMETHAZINE HCL 25 MG/ML IJ SOLN
12.5000 mg | Freq: Once | INTRAMUSCULAR | Status: AC
  Administered 2014-05-03: 12.5 mg via INTRAVENOUS
  Filled 2014-05-03: qty 1

## 2014-05-03 MED ORDER — METOPROLOL TARTRATE 1 MG/ML IV SOLN
2.5000 mg | Freq: Once | INTRAVENOUS | Status: AC
  Administered 2014-05-03: 2.5 mg via INTRAVENOUS
  Filled 2014-05-03: qty 5

## 2014-05-03 MED ORDER — METRONIDAZOLE IN NACL 5-0.79 MG/ML-% IV SOLN
500.0000 mg | Freq: Four times a day (QID) | INTRAVENOUS | Status: DC
  Administered 2014-05-03 – 2014-05-07 (×16): 500 mg via INTRAVENOUS
  Filled 2014-05-03 (×19): qty 100

## 2014-05-03 MED ORDER — METOPROLOL TARTRATE 1 MG/ML IV SOLN
2.5000 mg | INTRAVENOUS | Status: DC | PRN
  Filled 2014-05-03 (×2): qty 5

## 2014-05-03 MED ORDER — HYDROMORPHONE HCL 1 MG/ML IJ SOLN: 0.5000 mg | INTRAMUSCULAR | Status: DC | PRN

## 2014-05-03 MED ORDER — CIPROFLOXACIN IN D5W 400 MG/200ML IV SOLN
400.0000 mg | INTRAVENOUS | Status: DC
  Administered 2014-05-04: 400 mg via INTRAVENOUS
  Filled 2014-05-03 (×2): qty 200

## 2014-05-03 NOTE — Progress Notes (Signed)
Late entry: Pt admitted from ED to 6E13 at 1000. Transported via stretcher. Pt on RA. PIV in [place] with flagyl infusing. Skin assessed. Intact. Will continue to monitor. Bartholomew Crews, RN

## 2014-05-03 NOTE — ED Provider Notes (Signed)
CSN: [id number]     Arrival date & time 05/03/14  [id number] History   First MD Initiated Contact with Patient 05/03/14 [phone]     Chief Complaint  Patient presents with  . Abdominal Pain  . Back Pain  . Shortness of Breath     (Consider location/radiation/quality/duration/timing/severity/associated sxs/prior Treatment) HPI Comments: 74 year old female with a history of an appendectomy one year ago, history of hypertension, history of approximately 2 days of left-sided abdominal pain, this was gradual in onset, gradually worsening, it is now severe. She also endorses having small hard stools, no urinary symptoms, no fevers chills. Nothing seems to make this better, seems to be worse with palpation of the abdomen, there is associated lower back pain.  Patient is a 74 y.o. female presenting with abdominal pain, back pain, and shortness of breath. The history is provided by the patient.  Abdominal Pain Associated symptoms: shortness of breath   Back Pain Associated symptoms: abdominal pain   Shortness of Breath Associated symptoms: abdominal pain     Past Medical History  Diagnosis Date  . Diabetes mellitus   . Hypertension   . Hyperlipidemia   . Tricuspid regurgitation     Echo 05/02/06-Nml LV. Mild MR. Mod TR  . Osteoporosis   . Diastolic heart failure   . Vitamin D deficiency   . Osteoporosis   . CKD (chronic kidney disease) stage 3, GFR 30-59 ml/min 12/25/2012   Past Surgical History  Procedure Laterality Date  . Heart chamber revision      patched hole --1993  . Tubal ligation    . Laparoscopic appendectomy N/A 09/27/2013    Procedure: APPENDECTOMY - CONVERTED FROM LAPAROSCOPIC;  Surgeon: Donnie Mesa, MD;  Location: [facility] OR;  Service: General;  Laterality: N/A;   Family History  Problem Relation Age of Onset  . Cancer Father   . Diabetes Father   . Cancer Brother     Brain  . Cancer Sister     abdominal fat   History  Substance Use Topics  . Smoking status: Former Smoker     Quit date: 01/19/2009  . Smokeless tobacco: Never Used  . Alcohol Use: No   OB History    No data available     Review of Systems  Respiratory: Positive for shortness of breath.   Gastrointestinal: Positive for abdominal pain.  Musculoskeletal: Positive for back pain.  All other systems reviewed and are negative.     Allergies  Ace inhibitors; Aspirin; and Metformin and related  Home Medications   Prior to Admission medications   Medication Sig Start Date End Date Taking? Authorizing Provider  acetaminophen (TYLENOL) 500 MG tablet Take 500 mg by mouth every 6 (six) hours as needed for pain.    Historical Provider, MD  amLODipine (NORVASC) 10 MG tablet TAKE ONE TABLET BY MOUTH ONCE DAILY 01/05/14   Arnoldo Lenis, MD  aspirin EC 81 MG tablet Take 81 mg by mouth daily.    Historical Provider, MD  cholecalciferol (VITAMIN D) 1000 UNITS tablet Take 4,000 Units by mouth daily.     Historical Provider, MD  cloNIDine (CATAPRES) 0.2 MG tablet Take 1 tablet (0.2 mg total) by mouth 2 (two) times daily. 02/16/14   Lendon Colonel, NP  furosemide (LASIX) 40 MG tablet Take 1 tablet (40 mg total) by mouth daily. 10/30/13   Lonie Peak Dixon, PA-C  insulin aspart (NOVOLOG) 100 UNIT/ML FlexPen Inject 5 Units into the skin daily before supper. Take 15 min before  supper or immediately after eating supper 03/12/14   Orlena Sheldon, PA-C  Insulin Detemir (LEVEMIR FLEXTOUCH) 100 UNIT/ML Pen Inject 22 Units into the skin daily at 10 pm. 03/04/14   Orlena Sheldon, PA-C  RELION SHORT PEN NEEDLES 31G X 8 MM MISC USE ONE PEN NEEDLE ONCE DAILY AS DIRECTED 03/10/14   Orlena Sheldon, PA-C  simvastatin (ZOCOR) 20 MG tablet TAKE ONE TABLET BY MOUTH ONCE DAILY AT BEDTIME 11/04/13   Mary B Dixon, PA-C   BP 189/61 mmHg  Pulse 93  Temp(Src) 98 F (36.7 C) (Oral)  Resp 22  Ht 5\' 3"  (1.6 m)  Wt 160 lb (72.576 kg)  BMI 28.35 kg/m2  SpO2 97% Physical Exam  Constitutional: She appears well-developed and  well-nourished. No distress.  HENT:  Head: Normocephalic and atraumatic.  Mouth/Throat: Oropharynx is clear and moist. No oropharyngeal exudate.  Eyes: Conjunctivae and EOM are normal. Pupils are equal, round, and reactive to light. Right eye exhibits no discharge. Left eye exhibits no discharge. No scleral icterus.  Neck: Normal range of motion. Neck supple. No JVD present. No thyromegaly present.  Cardiovascular: Normal rate, regular rhythm, normal heart sounds and intact distal pulses.  Exam reveals no gallop and no friction rub.   No murmur heard. Pulmonary/Chest: Effort normal and breath sounds normal. No respiratory distress. She has no wheezes. She has no rales.  Abdominal: Soft. Bowel sounds are normal. She exhibits no distension and no mass. There is tenderness (no peritoneal signs, tenderness with palpation over the left mid and lower abdomen, no cough.).  No cough pain, no heel tap pain, she does have distended abdomen with tympanitic sounds to percussion  Musculoskeletal: Normal range of motion. She exhibits no edema or tenderness.  Lymphadenopathy:    She has no cervical adenopathy.  Neurological: She is alert. Coordination normal.  Skin: Skin is warm and dry. No rash noted. No erythema.  Psychiatric: She has a normal mood and affect. Her behavior is normal.  Nursing note and vitals reviewed.   ED Course  Procedures (including critical care time) Labs Review Labs Reviewed  CBC WITH DIFFERENTIAL/PLATELET  COMPREHENSIVE METABOLIC PANEL  LIPASE, BLOOD  URINALYSIS, ROUTINE W REFLEX MICROSCOPIC    Imaging Review No results found.    MDM   Final diagnoses:  None    The patient has an abnormal abdominal exam which could be consistent with diverticulitis, constipation, obstruction, x-ray and labs ordered, so medic medications ordered. The patient is in agreement with the plan.  UTI present, CT pending at change of shift - VS without fever, tachycardia or  hypotension.  CAre signed out to Dr. Jeneen Rinks  Meds given in ED:  Medications  ondansetron [facility]) injection 4 mg (4 mg Intravenous Given 05/03/14 0534)  sodium chloride 0.9 % bolus 500 mL (500 mLs Intravenous New Bag/Given 05/03/14 0534)  fentaNYL (SUBLIMAZE) injection 50 mcg (50 mcg Intravenous Given 05/03/14 0534)    New Prescriptions   No medications on file        Noemi Chapel, MD 05/03/14 1642

## 2014-05-03 NOTE — ED Provider Notes (Signed)
Patient discussed with Dr. Sabra Heck. Patient examined. History reviewed. Studies reviewed. On exam she has mild diffuse tenderness. Hyperactive bowel sounds. No peritoneal irritation. Continues to complain of 8 out of 10 pain, and recurrent nausea. Remedicated with pain medication and antiemetics. CT scan shows diverticulitis without perforation or abscess. Patient's primary care is Dena Billet, [place] at [facility]. She is unassigned. Discussed with medicine resident on-call as an unassigned admission. Given Cipro, and Flagyl IV and admission bed equested.  Tanna Furry, MD 05/03/14 [phone]

## 2014-05-03 NOTE — H&P (Signed)
Date: 05/03/2014               Patient Name:  Laura Best MRN: [id number]  DOB: 28-Jan-1940 Age / Sex: 74 y.o., female   PCP: Orlena Sheldon, PA-C              Medical Service: Internal Medicine Teaching Service              Attending Physician: Dr. Bertha Stakes, MD    First Contact: Dr. Trudee Kuster Pager: [phone]  Second Contact: Dr. Gordy Levan Pager: [phone]            After Hours (After 5p/  First Contact Pager: [phone]  weekends / holidays): Second Contact Pager: [phone]   Chief Complaint:  Abdominal pain.  History of Present Illness: Laura Best is a 74 year old woman with history of type 2 diabetes, hypertension, hyperlipidemia, congestive heart failure, osteoporosis, CKD stage III, and appendectomy one year ago presenting with abdominal pain. She reports developing left-sided abdominal pain approximately 2 days ago that is gradually been worsening. The pain is currently severe, sharp and radiates to her left flank. She reports being constipated and passing only small hard stools. She reports increasing nausea and vomiting. She has thrown up 2 or 3 times in the past day, including during the interview. Her vomitus is clear and nonbloody. She denies any hematochezia or melena. She denies dysuria or frequency, but she reports some urinary incontinence during vomiting. She also notes that her urine has been darker than normal for the past day and foul-smelling. She also reports mild subjective fevers and chills. Her pain is not associated with bowel movements or food intake, but she has been unable to take much food orally over the past day due to her nausea.  In the ER, abdominal CT was consistent with mild early acute sigmoid diverticulitis. She was started on ceftriaxone and Flagyl IV. She was given Zofran for nausea and fentanyl for pain. She was bolused with 500 mL normal saline. She was admitted for pain control.  Review of Systems: Review of Systems  Constitutional: Positive for  fever, chills and malaise/fatigue.  HENT: Negative for congestion and sore throat.   Respiratory: Negative for cough, shortness of breath and wheezing.   Cardiovascular: Positive for orthopnea (Sleeps on 4 pillows, chronic.). Negative for chest pain and leg swelling.  Gastrointestinal: Positive for nausea, vomiting, abdominal pain and constipation. Negative for diarrhea.  Genitourinary: Positive for flank pain. Negative for dysuria and urgency.  Musculoskeletal: Positive for back pain. Negative for myalgias, joint pain and neck pain.  Skin: Negative for rash.  Neurological: Negative for dizziness, sensory change, focal weakness and headaches.    Meds: Medications Prior to Admission  Medication Sig Dispense Refill  . acetaminophen (TYLENOL) 500 MG tablet Take 500 mg by mouth every 6 (six) hours as needed for pain.    Marland Kitchen amLODipine (NORVASC) 10 MG tablet TAKE ONE TABLET BY MOUTH ONCE DAILY 90 tablet 3  . cholecalciferol (VITAMIN D) 1000 UNITS tablet Take 4,000 Units by mouth daily.     . cloNIDine (CATAPRES) 0.2 MG tablet Take 1 tablet (0.2 mg total) by mouth 2 (two) times daily. 60 tablet 11  . furosemide (LASIX) 40 MG tablet Take 1 tablet (40 mg total) by mouth daily. 30 tablet 5  . insulin aspart (NOVOLOG) 100 UNIT/ML FlexPen Inject 5 Units into the skin daily before supper. Take 15 min before supper or immediately after eating supper 15 mL 11  . Insulin  Detemir (LEVEMIR FLEXTOUCH) 100 UNIT/ML Pen Inject 22 Units into the skin daily at 10 pm. 15 mL 0  . simvastatin (ZOCOR) 20 MG tablet TAKE ONE TABLET BY MOUTH ONCE DAILY AT BEDTIME 90 tablet 1  . RELION SHORT PEN NEEDLES 31G X 8 MM MISC USE ONE PEN NEEDLE ONCE DAILY AS DIRECTED 50 each 11   Current Facility-Administered Medications  Medication Dose Route Frequency Provider Last Rate Last Dose  . ciprofloxacin (CIPRO) IVPB 400 mg  400 mg Intravenous Once Tanna Furry, MD 200 mL/hr at 05/03/14 1017 400 mg at 05/03/14 1017  . heparin injection  5,000 Units  5,000 Units Subcutaneous 3 times per day Jones Bales, MD      . HYDROmorphone (DILAUDID) injection 0.5 mg  0.5 mg Intravenous Q4H PRN Jones Bales, MD      . metoprolol (LOPRESSOR) injection 2.5 mg  2.5 mg Intravenous Once Jones Bales, MD      . ondansetron (ZOFRAN) tablet 4 mg  4 mg Oral Q6H PRN Jones Bales, MD       Or  . ondansetron (ZOFRAN) injection 4 mg  4 mg Intravenous Q6H PRN Jones Bales, MD        Allergies: Allergies as of 05/03/2014 - Review Complete 05/03/2014  Allergen Reaction Noted  . Ace inhibitors Other (See Comments) 02/12/2013  . Actos [pioglitazone] Swelling 05/03/2014  . Aspirin Other (See Comments) 02/02/2011  . Metformin and related Diarrhea 05/11/2010   Past Medical History  Diagnosis Date  . Diabetes mellitus   . Hypertension   . Hyperlipidemia   . Tricuspid regurgitation     Echo 05/02/06-Nml LV. Mild MR. Mod TR  . Osteoporosis   . Diastolic heart failure   . Vitamin D deficiency   . Osteoporosis   . CKD (chronic kidney disease) stage 3, GFR 30-59 ml/min 12/25/2012   Past Surgical History  Procedure Laterality Date  . Heart chamber revision      patched hole --1993  . Tubal ligation    . Laparoscopic appendectomy N/A 09/27/2013    Procedure: APPENDECTOMY - CONVERTED FROM LAPAROSCOPIC;  Surgeon: Donnie Mesa, MD;  Location: [facility] OR;  Service: General;  Laterality: N/A;   Family History  Problem Relation Age of Onset  . Cancer Father   . Diabetes Father   . Cancer Brother     Brain  . Cancer Sister     abdominal fat   History   Social History  . Marital Status: Widowed    Spouse Name: N/A  . Number of Children: N/A  . Years of Education: N/A   Occupational History  . Not on file.   Social History Main Topics  . Smoking status: Former Smoker    Quit date: 01/19/2009  . Smokeless tobacco: Never Used  . Alcohol Use: No  . Drug Use: No  . Sexual Activity: Not on file   Other Topics Concern  . Not on  file   Social History Narrative   Entered 10/2013:   She has never driven.   She lives with her son.    Physical Exam: Filed Vitals:   05/03/14 1017  BP: 186/88  Pulse: 92  Temp: 99.3 F (37.4 C)  Resp: 20   Physical Exam  Constitutional: She is oriented to person, place, and time. She appears distressed (in pain, vomiting.).  Obese.  HENT:  Head: Normocephalic and atraumatic.  Mouth/Throat: No oropharyngeal exudate.  Missing several teeth.  Eyes: Conjunctivae and EOM  are normal. Pupils are equal, round, and reactive to light. No scleral icterus.  Neck: Normal range of motion. Neck supple.  Cardiovascular: Normal rate, regular rhythm and normal heart sounds.   Pulmonary/Chest: Effort normal and breath sounds normal. No respiratory distress. She has no wheezes. She has no rales.  Abdominal: Soft. Bowel sounds are normal. She exhibits no distension and no mass. There is tenderness (diffuse, greatest in left lower quadrant.). There is no rebound and no guarding.  Left costovertebral angle tenderness.  Musculoskeletal: Normal range of motion. She exhibits no edema or tenderness.  Neurological: She is alert and oriented to person, place, and time. No cranial nerve deficit. She exhibits normal muscle tone.  Skin: Skin is warm and dry. No rash noted. She is not diaphoretic. No erythema.    Lab results: Basic Metabolic Panel:  Recent Labs  05/03/14 0532  NA 138  K 3.5  CL 103  CO2 26  GLUCOSE 211*  BUN 18  CREATININE 1.60*  CALCIUM 9.3   Liver Function Tests:  Recent Labs  05/03/14 0532  AST 17  ALT 14  ALKPHOS 75  BILITOT 0.4  PROT 7.5  ALBUMIN 3.8    Recent Labs  05/03/14 0532  LIPASE 28   CBC:  Recent Labs  05/03/14 0532  WBC 8.4  NEUTROABS 6.0  HGB 12.2  HCT 36.6  MCV 83.0  PLT 191   CBG:  Recent Labs  05/03/14 0643  GLUCAP 187*   Urinalysis:  Recent Labs  05/03/14 0645  COLORURINE YELLOW  LABSPEC 1.015  PHURINE 6.0  GLUCOSEU  100*  HGBUR SMALL*  BILIRUBINUR NEGATIVE  KETONESUR NEGATIVE  PROTEINUR 100*  UROBILINOGEN 0.2  NITRITE POSITIVE*  LEUKOCYTESUR MODERATE*    Imaging results:  Ct Abdomen Pelvis Wo Contrast  05/03/2014   CLINICAL DATA:  Left-sided abdominal pain and flank pain for 1 day. Hard stool.  EXAM: CT ABDOMEN AND PELVIS WITHOUT CONTRAST  TECHNIQUE: Multidetector CT imaging of the abdomen and pelvis was performed following the standard protocol without IV contrast.  COMPARISON:  Not available  FINDINGS: Liver, gallbladder, pancreas, spleen, and adrenal glands are within normal limits  Chronic changes of the kidneys without hydronephrosis or obvious mass  There is subtle wall thickening of the sigmoid colon associated with stranding in the adjacent fat and diverticulosis. Findings compatible with early acute diverticulitis. No evidence of extraluminal bowel gas. No evidence of abscess. There is extensive stool throughout the ascending and transverse colon. Splenic flexure and descending colon are decompressed. No obvious mass at the transition point on image 35.  Atherosclerotic calcifications  Moderate-sized hiatal hernia.  Pericardial calcification adjacent to the right atrium.  Bladder, uterus, and adnexa are unremarkable.  No free-fluid. No obvious retroperitoneal adenopathy by measurement criteria.  Advanced L5-S1 degenerative disc disease with vacuum. No vertebral compression.  IMPRESSION: Findings compatible with mild early acute sigmoid diverticulitis.   Electronically Signed   By: Marybelle Killings M.D.   On: 05/03/2014 08:19    Assessment & Plan by Problem: Principal Problem:   Acute diverticulitis Active Problems:   Hypertension   Diabetes type 2, uncontrolled   CKD (chronic kidney disease) stage 3, GFR 30-59 ml/min   Left ventricular diastolic dysfunction with preserved systolic function   #Acute uncomplicated diverticulitis CT scan shows diverticulitis without perforation or abscess, which is  the most likely cause of her abdominal pain. She has continued to have significant pain despite IV pain medication along with nausea, so she'll be managed as  an inpatient. She had a colonoscopy in 2010, which was reportedly normal. -Admit to MedSurg. -Nothing by mouth. -Ciprofloxacin and Flagyl IV. Continue antibiotics for 10-14 days. -Normal saline at 50 mL per hour for 10 hours. -Dilaudid 0.5 mg every 4 hours as needed. -Phenergan 12.5 mg every 4 hours as needed. -Repeat CBC and CMP in the morning -Repeat colonoscopy 6 weeks after recovery.  #Pyelonephritis Patient presented with left flank pain and subjective fevers. Her urinalysis is consistent with an infection, and it would be atypical for diverticulitis to cause left back and flank pain. She also reports incontinence and dark foul-smelling urine. I suspect she has coexisting pyelonephritis in addition to her diverticulitis. -Pyelonephritis will be treated with ciprofloxacin. -Urine culture, add on to previous collection.  #Diastolic congestive heart failure Echocardiogram from December 2014 showed ejection fraction [id number], grade 2 diastolic dysfunction. Evidence of right ventricular pressure/volume overload was noted along with mild pulmonary hypertension.  She is on Lasix 40 mg daily at home. She follows with Bunnie Domino, NP at [facility]. -Hold Lasix in setting of diverticulitis.  #Chronic kidney disease, stage III Creatinine 1.6 on presentation, which appears to be at her most recent baseline. -Continue to monitor.  #Type 2 diabetes She takes NovoLog 5 units before dinner along with Levemir 22 units daily at bedtime. -CBG and SSI-sensitive every 6 hours while nothing by mouth.  #Hypertension Blood pressure elevated to 186/88 currently. She is on amlodipine 10 mg daily, clonidine 0.2 mg twice a day, -Hold oral blood pressure medications while nothing by mouth. -Metoprolol 2.5 mg IV once. -Metoprolol 2.5 mg  IV when necessary for systolic blood pressure greater than 190.  #Hyperlipidemia -Continue home Zocor 20 mg daily when tolerating PO.  #Osteoporosis -Continue home vitamin D 4000 units daily once no longer nothing by mouth.  #DVT prophylaxis -Heparin.  Dispo: Disposition is deferred at this time, awaiting improvement of current medical problems. Anticipated discharge in approximately 1-3 day(s).   The patient does have a current PCP Orlena Sheldon, PA-C), therefore will not be require [facility] follow-up after discharge.   The patient does have transportation limitations that hinder transportation to clinic appointments.   Signed:  Arman Filter, MD, PhD PGY-1 Internal Medicine Teaching Service Pager: [phone] 05/03/2014, 10:44 AM

## 2014-05-03 NOTE — Progress Notes (Signed)
Utilization review completed.  

## 2014-05-03 NOTE — Progress Notes (Signed)
ANTIBIOTIC CONSULT NOTE - INITIAL  Pharmacy Consult for Ciprofloxacin Indication: diverticulitis  Allergies  Allergen Reactions  . Ace Inhibitors Other (See Comments)    Hyperkalemia  . Actos [Pioglitazone] Swelling  . Aspirin Other (See Comments)    G.I. Upset.   . Metformin And Related Diarrhea    Patient Measurements: Height: 5\' 3"  (160 cm) Weight: 162 lb 4.1 oz (73.6 kg) IBW/kg (Calculated) : 52.4 Vital Signs: Temp: 99.3 F (37.4 C) (04/10 1017) Temp Source: Oral (04/10 1017) BP: 186/88 mmHg (04/10 1017) Pulse Rate: 92 (04/10 1017) Intake/Output from previous day:   Intake/Output from this shift: Total I/O In: 200 [IV Piggyback:200] Out: 300 [Emesis/NG output:300]  Labs:  Recent Labs  05/03/14 0532  WBC 8.4  HGB 12.2  PLT 191  CREATININE 1.60*   Estimated Creatinine Clearance: 30.1 mL/min (by C-G formula based on Cr of 1.6). No results for input(s): VANCOTROUGH, VANCOPEAK, VANCORANDOM, GENTTROUGH, GENTPEAK, GENTRANDOM, TOBRATROUGH, TOBRAPEAK, TOBRARND, AMIKACINPEAK, AMIKACINTROU, AMIKACIN in the last 72 hours.   Microbiology: No results found for this or any previous visit (from the past 720 hour(s)).  Medical History: Past Medical History  Diagnosis Date  . Diabetes mellitus   . Hypertension   . Hyperlipidemia   . Tricuspid regurgitation     Echo 05/02/06-Nml LV. Mild MR. Mod TR  . Osteoporosis   . Diastolic heart failure   . Vitamin D deficiency   . Osteoporosis   . CKD (chronic kidney disease) stage 3, GFR 30-59 ml/min 12/25/2012    Medications:  Scheduled:  . heparin  5,000 Units Subcutaneous 3 times per day  . insulin aspart  0-9 Units Subcutaneous Q6H  . metronidazole  500 mg Intravenous Q6H   Assessment: 73yoF presented to the ED with L sided adb and flank pain. CT shows diverticulitis w/out perf; also w/ suspected pyelonephritis. Pharmacy consulted to start Cipro. WBC 8.4, afebrile, baseline SCr 1.6, renal function stable. Patient  received 400mg  of Cipro in the ED.  Goal of Therapy:  Eradication of infection  Plan:  Cipro 400mg  IV q24h Flagyl per MD F/u culture and clinical progress  Thank you for allowing pharmacy to be part of this patient's care team  [place], Pharm.D Clinical Pharmacy Resident Pager: [phone] 05/03/2014 .2:41 PM      Caylee Vlachos, Kara Pacer 05/03/2014,2:38 PM

## 2014-05-03 NOTE — Progress Notes (Signed)
Pt BP 177/116, HR 93 at rest. MD made aware no new orders received. Will cont to monitor.

## 2014-05-03 NOTE — ED Notes (Signed)
Pt reports left sided abd pain and left flank pain since yesterday; pt also sob and reports hx of CHF; pt denies urinary symptoms, denies n/v/d; pt reports passing small hard pellet type stool; pt reports last BM  wednesday

## 2014-05-04 DIAGNOSIS — E876 Hypokalemia: Secondary | ICD-10-CM

## 2014-05-04 DIAGNOSIS — N12 Tubulo-interstitial nephritis, not specified as acute or chronic: Secondary | ICD-10-CM

## 2014-05-04 DIAGNOSIS — I5030 Unspecified diastolic (congestive) heart failure: Secondary | ICD-10-CM

## 2014-05-04 DIAGNOSIS — E785 Hyperlipidemia, unspecified: Secondary | ICD-10-CM

## 2014-05-04 DIAGNOSIS — I129 Hypertensive chronic kidney disease with stage 1 through stage 4 chronic kidney disease, or unspecified chronic kidney disease: Secondary | ICD-10-CM

## 2014-05-04 DIAGNOSIS — N183 Chronic kidney disease, stage 3 (moderate): Secondary | ICD-10-CM

## 2014-05-04 DIAGNOSIS — K5792 Diverticulitis of intestine, part unspecified, without perforation or abscess without bleeding: Secondary | ICD-10-CM

## 2014-05-04 DIAGNOSIS — E1165 Type 2 diabetes mellitus with hyperglycemia: Secondary | ICD-10-CM

## 2014-05-04 DIAGNOSIS — M810 Age-related osteoporosis without current pathological fracture: Secondary | ICD-10-CM

## 2014-05-04 LAB — CBC
HEMATOCRIT: 36 % (ref 36.0–46.0)
HEMOGLOBIN: 11.9 g/dL — AB (ref 12.0–15.0)
MCH: 27.4 pg (ref 26.0–34.0)
MCHC: 33.1 g/dL (ref 30.0–36.0)
MCV: 82.9 fL (ref 78.0–100.0)
Platelets: 194 10*3/uL (ref 150–400)
RBC: 4.34 MIL/uL (ref 3.87–5.11)
RDW: 14.7 % (ref 11.5–15.5)
WBC: 8.8 10*3/uL (ref 4.0–10.5)

## 2014-05-04 LAB — COMPREHENSIVE METABOLIC PANEL
ALBUMIN: 3.4 g/dL — AB (ref 3.5–5.2)
ALK PHOS: 70 U/L (ref 39–117)
ALT: 16 U/L (ref 0–35)
AST: 20 U/L (ref 0–37)
Anion gap: 9 (ref 5–15)
BUN: 20 mg/dL (ref 6–23)
CALCIUM: 9.1 mg/dL (ref 8.4–10.5)
CO2: 29 mmol/L (ref 19–32)
Chloride: 103 mmol/L (ref 96–112)
Creatinine, Ser: 1.81 mg/dL — ABNORMAL HIGH (ref 0.50–1.10)
GFR calc Af Amer: 31 mL/min — ABNORMAL LOW (ref >90)
GFR calc non Af Amer: 27 mL/min — ABNORMAL LOW (ref >90)
GLUCOSE: 318 mg/dL — AB (ref 70–99)
Potassium: 3.3 mmol/L — ABNORMAL LOW (ref 3.5–5.1)
Sodium: 141 mmol/L (ref 135–145)
TOTAL PROTEIN: 7.4 g/dL (ref 6.0–8.3)
Total Bilirubin: 0.5 mg/dL (ref 0.3–1.2)

## 2014-05-04 LAB — GLUCOSE, CAPILLARY
GLUCOSE-CAPILLARY: 299 mg/dL — AB (ref 70–99)
GLUCOSE-CAPILLARY: 314 mg/dL — AB (ref 70–99)
Glucose-Capillary: 298 mg/dL — ABNORMAL HIGH (ref 70–99)
Glucose-Capillary: 266 mg/dL — ABNORMAL HIGH (ref 70–99)

## 2014-05-04 MED ORDER — SODIUM CHLORIDE 0.9 % IV SOLN
INTRAVENOUS | Status: DC
  Administered 2014-05-04: 17:00:00 via INTRAVENOUS

## 2014-05-04 MED ORDER — SIMVASTATIN 20 MG PO TABS
20.0000 mg | ORAL_TABLET | Freq: Every day | ORAL | Status: DC
  Administered 2014-05-05 – 2014-05-08 (×4): 20 mg via ORAL
  Filled 2014-05-04 (×5): qty 1

## 2014-05-04 MED ORDER — VITAMIN D3 25 MCG (1000 UNIT) PO TABS
4000.0000 [IU] | ORAL_TABLET | Freq: Every day | ORAL | Status: DC
  Filled 2014-05-04 (×2): qty 4

## 2014-05-04 MED ORDER — PROMETHAZINE HCL 25 MG/ML IJ SOLN: 12.5000 mg | INTRAMUSCULAR | Status: DC | PRN

## 2014-05-04 MED ORDER — POTASSIUM CHLORIDE 10 MEQ/100ML IV SOLN
10.0000 meq | INTRAVENOUS | Status: DC
Start: 2014-05-04 — End: 2014-05-04

## 2014-05-04 MED ORDER — POTASSIUM CHLORIDE 10 MEQ/100ML IV SOLN
10.0000 meq | INTRAVENOUS | Status: AC
  Administered 2014-05-04 (×3): 10 meq via INTRAVENOUS
  Filled 2014-05-04 (×6): qty 100

## 2014-05-04 MED ORDER — PROMETHAZINE HCL 25 MG/ML IJ SOLN
12.5000 mg | INTRAMUSCULAR | Status: DC | PRN
  Administered 2014-05-05 (×4): 12.5 mg via INTRAVENOUS
  Filled 2014-05-04 (×4): qty 1

## 2014-05-04 MED ORDER — CLONIDINE HCL 0.2 MG PO TABS
0.2000 mg | ORAL_TABLET | Freq: Two times a day (BID) | ORAL | Status: DC
  Filled 2014-05-04 (×2): qty 1

## 2014-05-04 MED ORDER — INSULIN DETEMIR 100 UNIT/ML ~~LOC~~ SOLN
10.0000 [IU] | Freq: Every day | SUBCUTANEOUS | Status: DC
  Administered 2014-05-04: 10 [IU] via SUBCUTANEOUS
  Filled 2014-05-04 (×2): qty 0.1

## 2014-05-04 MED ORDER — AMLODIPINE BESYLATE 10 MG PO TABS
10.0000 mg | ORAL_TABLET | Freq: Every day | ORAL | Status: DC
  Administered 2014-05-04 – 2014-05-08 (×5): 10 mg via ORAL
  Filled 2014-05-04 (×5): qty 1

## 2014-05-04 MED ORDER — PROMETHAZINE HCL 25 MG/ML IJ SOLN
6.2500 mg | Freq: Once | INTRAMUSCULAR | Status: AC
  Administered 2014-05-04: 6.25 mg via INTRAVENOUS
  Filled 2014-05-04: qty 1

## 2014-05-04 NOTE — Progress Notes (Signed)
Patient seen and examined.  In brief, patient is a 74 y/o female with PMH of HTN, DM type 2, CHF, osteoporosis, CKD stage III and appendectomy 1 year ago who p/w abd pain * 2-3 days. Patient states abd pain was left sided, sharp, radiating to her left flank, gradually increasing in intensity and was assoc with n/v * multiple episodes. Vomitus was non bloody, non bilous and clear. Patient also noted subjective fevers and chills and foul smelling urine. Patient also had poor PO intake secondary to n/v.   Physical Exam: Gen: AAO*3, NAD CVS: RRR, normal heart sounds Lungs: CTA b/l Abd: soft, mild LLQ abd tenderness with no rebound/guarding, no CVA tenderness, BS + Ext: no edema  Assessment and Plan: 74 y/o female with n/v, abd pain likely secondary to acute sigmoid diverticulitis. Will c/w Cipro and Flagyl for now and attempt to advance diet to clear liquids. Will c/w pain control and phenergan prn.  Patient also noted to have dirty u/a and given CVA tenderness on initial resident exam patient with also possible UTI/pyelonephritis. Would c/w cipro for now and f/u urine cx.  BS are elevated. Would restart levemir for now and monitor.   Patient also noted to have elevated BP. Would resume PO meds if patient able to tolerate PO

## 2014-05-04 NOTE — Progress Notes (Signed)
Subjective:    Currently, the patient reports significant improvement in her abdominal pain. She also reports that her nausea and vomiting is improved. She is hungry and would like to try eating some food today. She reports having a bowel movement this morning.  Interval Events: -Blood pressure slightly elevated today to 171/65.  -Felt warm this morning but afebrile, feels better with wet rag on her head.    Objective:    Vital Signs:   Temp:  [98.7 F (37.1 C)-99.9 F (37.7 C)] 99.9 F (37.7 C) (04/11 0927) Pulse Rate:  [91-110] 110 (04/11 0927) Resp:  [16-18] 18 (04/11 0927) BP: (159-183)/(62-116) 159/69 mmHg (04/11 0927) SpO2:  [91 %-93 %] 91 % (04/11 0927) Weight:  [161 lb 13.1 oz (73.4 kg)-164 lb 0.4 oz (74.4 kg)] 161 lb 13.1 oz (73.4 kg) (04/11 0551) Last BM Date: 04/29/14  24-hour weight change: Weight change: 2 lb 4.1 oz (1.025 kg)  Intake/Output:   Intake/Output Summary (Last 24 hours) at 05/04/14 1404 Last data filed at 05/04/14 1300  Gross per 24 hour  Intake  692.5 ml  Output   1075 ml  Net -382.5 ml      Physical Exam: General: Well-developed, well-nourished, in no acute distress; alert, appropriate and cooperative throughout examination.  Lungs:  Normal respiratory effort. Clear to auscultation BL without crackles or wheezes.  Heart: RRR. S1 and S2 normal without gallop, murmur, or rubs.  Abdomen:  Mild LLQ abdominal pain.  Extremities: No pretibial edema.     Labs:  Basic Metabolic Panel:  Recent Labs Lab 05/03/14 0532 05/04/14 0435  NA 138 141  K 3.5 3.3*  CL 103 103  CO2 26 29  GLUCOSE 211* 318*  BUN 18 20  CREATININE 1.60* 1.81*  CALCIUM 9.3 9.1    Liver Function Tests:  Recent Labs Lab 05/03/14 0532 05/04/14 0435  AST 17 20  ALT 14 16  ALKPHOS 75 70  BILITOT 0.4 0.5  PROT 7.5 7.4  ALBUMIN 3.8 3.4*    Recent Labs Lab 05/03/14 0532  LIPASE 28   CBC:  Recent Labs Lab 05/03/14 0532 05/04/14 0435  WBC 8.4 8.8    NEUTROABS 6.0  --   HGB 12.2 11.9*  HCT 36.6 36.0  MCV 83.0 82.9  PLT 191 194   CBG:  Recent Labs Lab 05/03/14 1447 05/03/14 1647 05/03/14 2221 05/04/14 0251 05/04/14 0750  GLUCAP 249* 229* 336* 298* 299*    Microbiology: Results for orders placed or performed during the hospital encounter of 05/03/14  Culture, Urine     Status: None (Preliminary result)   Collection Time: 05/03/14  6:45 AM  Result Value Ref Range Status   Specimen Description URINE, RANDOM  Final   Special Requests NONE  Final   Colony Count   Final    >=100,000 COLONIES/ML Performed at [facility]    Culture   Final    ESCHERICHIA COLI Performed at [facility]    Report Status PENDING  Incomplete    Imaging: Ct Abdomen Pelvis Wo Contrast  05/03/2014   CLINICAL DATA:  Left-sided abdominal pain and flank pain for 1 day. Hard stool.  EXAM: CT ABDOMEN AND PELVIS WITHOUT CONTRAST  TECHNIQUE: Multidetector CT imaging of the abdomen and pelvis was performed following the standard protocol without IV contrast.  COMPARISON:  Not available  FINDINGS: Liver, gallbladder, pancreas, spleen, and adrenal glands are within normal limits  Chronic changes of the kidneys without hydronephrosis or obvious mass  There is subtle wall thickening of the sigmoid colon associated with stranding in the adjacent fat and diverticulosis. Findings compatible with early acute diverticulitis. No evidence of extraluminal bowel gas. No evidence of abscess. There is extensive stool throughout the ascending and transverse colon. Splenic flexure and descending colon are decompressed. No obvious mass at the transition point on image 35.  Atherosclerotic calcifications  Moderate-sized hiatal hernia.  Pericardial calcification adjacent to the right atrium.  Bladder, uterus, and adnexa are unremarkable.  No free-fluid. No obvious retroperitoneal adenopathy by measurement criteria.  Advanced L5-S1 degenerative disc disease with  vacuum. No vertebral compression.  IMPRESSION: Findings compatible with mild early acute sigmoid diverticulitis.   Electronically Signed   By: Marybelle Killings M.D.   On: 05/03/2014 08:19       Medications:    Infusions:    Scheduled Medications: . ciprofloxacin  400 mg Intravenous Q24H  . heparin  5,000 Units Subcutaneous 3 times per day  . insulin aspart  0-9 Units Subcutaneous Q6H  . metronidazole  500 mg Intravenous Q6H  . potassium chloride  10 mEq Intravenous Q1 Hr x 3    PRN Medications: HYDROmorphone (DILAUDID) injection, metoprolol, promethazine   Assessment/ Plan:    Principal Problem:   Acute diverticulitis Active Problems:   Hypertension   Diabetes type 2, uncontrolled   CKD (chronic kidney disease) stage 3, GFR 30-59 ml/min   Left ventricular diastolic dysfunction with preserved systolic function  #Acute uncomplicated diverticulitis Pain steadily improved, and she thinks she can tolerate food. Slightly tachycardic this morning. She reports having a bowel movement this morning. -Advance diet to clears. -Continue ciprofloxacin and Flagyl IV, day 2. Continue antibiotics for 10-14 days. -Stop fluids now that she is eating. -Dilaudid 0.5 mg every 4 hours as needed. -Phenergan 12.5 mg every 4 hours as needed. -Repeat colonoscopy 6 weeks after recovery.  #Pyelonephritis Patient presented with left flank pain and subjective fevers. Her urinalysis is consistent with an infection, and it would be atypical for diverticulitis to cause left back and flank pain. She also reports incontinence and dark foul-smelling urine. I suspect she has coexisting pyelonephritis in addition to her diverticulitis. -Pyelonephritis will be treated with ciprofloxacin. -Urine culture, add on to previous collection.  #Hypokalemia Potassium down to 3.3 this morning likely due to vomiting. -Potassium chloride 10 mEq 3. -Repeat BMP tomorrow.  #Diastolic congestive heart failure Does not  appear to be volume overloaded currently. -Continue to hold Lasix in setting of diverticulitis.  #Chronic kidney disease, stage III Creatinine slightly up this morning, but still baseline. -Continue to monitor.  #Type 2 diabetes Blood sugar elevated this morning. She takes NovoLog 5 units before dinner along with Levemir 22 units daily at bedtime. -Continue CBG and SSI-sensitive every 6 hours. -Start Levemir 10 units daily at bedtime now that she is eating.  #Hypertension Blood pressure elevated this morning.  She is on amlodipine 10 mg daily, clonidine 0.2 mg twice a day at home. -Restart clonidine 0.2 mg twice a day. -Continue to hold amlodipine for now.  #Hyperlipidemia -Restart home Zocor 20 mg daily.  #Osteoporosis -Restart home vitamin D 4000 units daily.   DVT PPX - heparin  CODE STATUS - Full.  CONSULTS PLACED - None.  DISPO - Disposition is deferred at this time, awaiting improvement of infections.   Anticipated discharge in approximately 1-3 day(s).   The patient does have a current PCP (DIXON,MARY BETH, PA-C) and does not need an [facility] hospital follow-up appointment after discharge.  Is the [facility] hospital follow-up appointment a one-time only appointment? not applicable.  Does the patient have transportation limitations that hinder transportation to clinic appointments? yes   SERVICE NEEDED AT [place]         Y = Yes, Blank = No PT:   OT:   RN:   Equipment:   Other:      Length of Stay: 1 day(s)   Signed: Charlesetta Shanks, MD  PGY-1, Internal Medicine Resident Pager: [phone] (7AM-5PM) 05/04/2014, 2:04 PM

## 2014-05-04 NOTE — Progress Notes (Signed)
Inpatient Diabetes Program Recommendations  AACE/ADA: New Consensus Statement on Inpatient Glycemic Control (2013)  Target Ranges:  Prepandial:   less than 140 mg/dL      Peak postprandial:   less than 180 mg/dL (1-2 hours)      Critically ill patients:  140 - 180 mg/dL   Reason for Assessment: Hyperglycemia  Diabetes history: DM2 Outpatient Diabetes medications: Levemir 22 units QHS, Novolog 5 units ac supper meal Current orders for Inpatient glycemic control: Novolog sensitive Q4H  Results for Laura, Best (MRN [id number]) as of 05/04/2014 12:12  Ref. Range 05/03/2014 14:47 05/03/2014 16:47 05/03/2014 22:21 05/04/2014 02:51 05/04/2014 07:50  Glucose-Capillary Latest Range: 70-99 mg/dL 249 (H) 229 (H) 336 (H) 298 (H) 299 (H)    Inpatient Diabetes Program Recommendations Insulin - Basal: Consider addition of Levemir 15 units QHS (On 22 units QHS at home) Titrate as needed. Insulin - Meal Coverage: When diet is advanced, Novolog 3 units ac supper meal if pt eating >50% meal HgbA1C: 8.9% on 02/11/14 - need updated HgbA1C to assess glycemic control prior to hospitalization Diet: When ready for advancement from liquids, Renal CHO mod med  Note: Will continue to follow. Thank you. Lorenda Peck, RD, LDN, CDE Inpatient Diabetes Coordinator [phone]

## 2014-05-05 ENCOUNTER — Inpatient Hospital Stay (HOSPITAL_COMMUNITY): Payer: Medicare HMO

## 2014-05-05 LAB — CBC
HEMATOCRIT: 36.2 % (ref 36.0–46.0)
Hemoglobin: 11.7 g/dL — ABNORMAL LOW (ref 12.0–15.0)
MCH: 27.1 pg (ref 26.0–34.0)
MCHC: 32.3 g/dL (ref 30.0–36.0)
MCV: 84 fL (ref 78.0–100.0)
PLATELETS: 196 10*3/uL (ref 150–400)
RBC: 4.31 MIL/uL (ref 3.87–5.11)
RDW: 15.1 % (ref 11.5–15.5)
WBC: 16.7 10*3/uL — AB (ref 4.0–10.5)

## 2014-05-05 LAB — BASIC METABOLIC PANEL WITH GFR
Anion gap: 13 (ref 5–15)
BUN: 22 mg/dL (ref 6–23)
CO2: 25 mmol/L (ref 19–32)
Calcium: 8.7 mg/dL (ref 8.4–10.5)
Chloride: 103 mmol/L (ref 96–112)
Creatinine, Ser: 1.67 mg/dL — ABNORMAL HIGH (ref 0.50–1.10)
GFR calc Af Amer: 34 mL/min — ABNORMAL LOW
GFR calc non Af Amer: 29 mL/min — ABNORMAL LOW
Glucose, Bld: 209 mg/dL — ABNORMAL HIGH (ref 70–99)
Potassium: 3.3 mmol/L — ABNORMAL LOW (ref 3.5–5.1)
Sodium: 141 mmol/L (ref 135–145)

## 2014-05-05 LAB — URINE CULTURE: Colony Count: >=100000

## 2014-05-05 LAB — GLUCOSE, CAPILLARY
Glucose-Capillary: 202 mg/dL — ABNORMAL HIGH (ref 70–99)
Glucose-Capillary: 203 mg/dL — ABNORMAL HIGH (ref 70–99)
Glucose-Capillary: 206 mg/dL — ABNORMAL HIGH (ref 70–99)
Glucose-Capillary: 212 mg/dL — ABNORMAL HIGH (ref 70–99)
Glucose-Capillary: 221 mg/dL — ABNORMAL HIGH (ref 70–99)
Glucose-Capillary: 228 mg/dL — ABNORMAL HIGH (ref 70–99)

## 2014-05-05 MED ORDER — POTASSIUM CHLORIDE 10 MEQ/100ML IV SOLN
10.0000 meq | INTRAVENOUS | Status: AC
  Administered 2014-05-05 (×3): 10 meq via INTRAVENOUS
  Filled 2014-05-05 (×4): qty 100

## 2014-05-05 MED ORDER — VITAMIN D3 25 MCG (1000 UNIT) PO TABS
1000.0000 [IU] | ORAL_TABLET | Freq: Every day | ORAL | Status: DC
  Administered 2014-05-05 – 2014-05-08 (×4): 1000 [IU] via ORAL
  Filled 2014-05-05 (×4): qty 1

## 2014-05-05 MED ORDER — SALINE SPRAY 0.65 % NA SOLN
1.0000 | NASAL | Status: DC | PRN
Start: 2014-05-05 — End: 2014-05-08
  Filled 2014-05-05: qty 44

## 2014-05-05 MED ORDER — CEFTRIAXONE SODIUM IN DEXTROSE 20 MG/ML IV SOLN
1.0000 g | INTRAVENOUS | Status: DC
  Administered 2014-05-05 – 2014-05-06 (×2): 1 g via INTRAVENOUS
  Filled 2014-05-05 (×3): qty 50

## 2014-05-05 MED ORDER — INSULIN ASPART 100 UNIT/ML ~~LOC~~ SOLN
0.0000 [IU] | Freq: Three times a day (TID) | SUBCUTANEOUS | Status: DC
  Administered 2014-05-06 (×2): 5 [IU] via SUBCUTANEOUS

## 2014-05-05 NOTE — Progress Notes (Signed)
Subjective:    The patient has had worsening abdominal pain, flank pain, and nausea and vomiting since yesterday. She reports throwing up at least 4 times last night. She did have a normal bowel movement this morning.  Interval Events: -Slightly tachycardic this morning. -Unable tolerate clear liquid diet yesterday with significant vomiting. -Phenergan dose increased and patient placed nothing by mouth.   Objective:    Vital Signs:   Temp:  [98.8 F (37.1 C)-99.4 F (37.4 C)] 98.8 F (37.1 C) (04/12 0902) Pulse Rate:  [95-110] 110 (04/12 0902) Resp:  [16-18] 17 (04/12 0902) BP: (144-165)/(58-89) 155/58 mmHg (04/12 0902) SpO2:  [92 %-96 %] 92 % (04/12 0902) Weight:  [163 lb 5.8 oz (74.1 kg)] 163 lb 5.8 oz (74.1 kg) (04/11 2056) Last BM Date: 04/29/14  24-hour weight change: Weight change: 1 lb 1.6 oz (0.5 kg)  Intake/Output:   Intake/Output Summary (Last 24 hours) at 05/05/14 1137 Last data filed at 05/05/14 0902  Gross per 24 hour  Intake 1227.5 ml  Output    304 ml  Net  923.5 ml      Physical Exam: General: Well-developed, well-nourished, in pain, appropriate and cooperative throughout examination.  Lungs:  Normal respiratory effort. Clear to auscultation BL without crackles or wheezes.  Heart: RRR. S1 and S2 normal without gallop, murmur, or rubs.  Abdomen:  Diffuse abdominal pain with guarding and rebound, worst in LLQ.  Recurrent flank pain.  Extremities: No pretibial edema.     Labs:  Basic Metabolic Panel:  Recent Labs Lab 05/03/14 0532 05/04/14 0435 05/05/14 0600  NA 138 141 141  K 3.5 3.3* 3.3*  CL 103 103 103  CO2 26 29 25   GLUCOSE 211* 318* 209*  BUN 18 20 22   CREATININE 1.60* 1.81* 1.67*  CALCIUM 9.3 9.1 8.7    Liver Function Tests:  Recent Labs Lab 05/03/14 0532 05/04/14 0435  AST 17 20  ALT 14 16  ALKPHOS 75 70  BILITOT 0.4 0.5  PROT 7.5 7.4  ALBUMIN 3.8 3.4*    Recent Labs Lab 05/03/14 0532  LIPASE 28    CBC:  Recent Labs Lab 05/03/14 0532 05/04/14 0435 05/05/14 0600  WBC 8.4 8.8 16.7*  NEUTROABS 6.0  --   --   HGB 12.2 11.9* 11.7*  HCT 36.6 36.0 36.2  MCV 83.0 82.9 84.0  PLT 191 194 196   CBG:  Recent Labs Lab 05/04/14 2104 05/04/14 2354 05/05/14 0511 05/05/14 0734 05/05/14 1102  GLUCAP 266* 228* 202* 206* 203*    Microbiology: Results for orders placed or performed during the hospital encounter of 05/03/14  Culture, Urine     Status: None (Preliminary result)   Collection Time: 05/03/14  6:45 AM  Result Value Ref Range Status   Specimen Description URINE, RANDOM  Final   Special Requests NONE  Final   Colony Count   Final    >=100,000 COLONIES/ML Performed at [place]   Final    ESCHERICHIA COLI Performed at [facility]    Report Status PENDING  Incomplete    Imaging: Dg Abd 2 Views  05/05/2014   CLINICAL DATA:  Acute upper abdominal pain.  EXAM: ABDOMEN - 2 VIEW  COMPARISON:  September 27, 2013.  FINDINGS: The bowel gas pattern is normal. There is no evidence of free air. Vascular calcifications are seen in the pelvis.  IMPRESSION: No evidence bowel obstruction or ileus.   Electronically Signed   By: Jeneen Rinks  Murlean Caller, M.D.   On: 05/05/2014 08:50       Medications:    Infusions: . sodium chloride 50 mL/hr at 05/04/14 1715    Scheduled Medications: . amLODipine  10 mg Oral Daily  . cholecalciferol  1,000 Units Oral Daily  . heparin  5,000 Units Subcutaneous 3 times per day  . insulin aspart  0-9 Units Subcutaneous Q6H  . insulin detemir  10 Units Subcutaneous QHS  . metronidazole  500 mg Intravenous Q6H  . potassium chloride  10 mEq Intravenous Q1 Hr x 3  . simvastatin  20 mg Oral Q breakfast    PRN Medications: HYDROmorphone (DILAUDID) injection, promethazine, sodium chloride   Assessment/ Plan:    Principal Problem:   Acute diverticulitis Active Problems:   Hypertension   Diabetes type 2, uncontrolled    CKD (chronic kidney disease) stage 3, GFR 30-59 ml/min   Left ventricular diastolic dysfunction with preserved systolic function  #Pyelonephritis Recurrent abdominal and flank pain. She was unable to tolerate liquids. Her white blood cell count increased to 16.7 this morning, suggesting antibiotic resistance. She had initially improved following IV ceftriaxone in the ED, but she was switched to Cipro on the floor. She may have had some relief from the ceftriaxone, so we'll switch her back to that. She is growing Escherichia coli on her urine culture. Given her flank pain and resistance to Cipro, it appears that pyelonephritis was the main cause of her presenting symptoms rather than diverticulitis. -Switch from ciprofloxacin to ceftriaxone IV, day 1 of new regimen. Continue for 10-14 days. -Follow-up urine culture sensitivities -Keep patient nothing by mouth, okay to sip water. -Dilaudid 0.5 mg every 4 hours as needed. -Phenergan 12.5 mg every 4 hours as needed. -Normal saline at 50 mL per hour.  #Acute uncomplicated diverticulitis Recurrent abdominal pain as above. -Ceftriaxone as above. -Continue Flagyl IV, day 3.  Continue antibiotics for 10-14 days. -Repeat colonoscopy 6 weeks after recovery.  #Hypokalemia Potassium again low at 3.3 this morning due to vomiting. -Potassium chloride 10 mEq 3. -Repeat BMP tomorrow.  #Diastolic congestive heart failure Euvolemic on exam. -Continue to hold Lasix in setting of infection.  #Chronic kidney disease, stage III Creatinine stable. -Continue to monitor.  #Type 2 diabetes Blood sugars stable in the low 200s. Not eating currently. -Continue CBG and SSI-sensitive every 6 hours. -Hold Levemir while nothing by mouth.  #Hypertension Blood pressure improved this morning. -Continue home amlodipine 10 mg daily. -Restart home clonidine if blood pressure remains elevated.  #Hyperlipidemia -Continue home Zocor 20 mg  daily.  #Osteoporosis -Reduce home vitamin D to 1000 units daily.   DVT PPX - heparin  CODE STATUS - Full.  CONSULTS PLACED - None.  DISPO - Disposition is deferred at this time, awaiting improvement of infections.   Anticipated discharge in approximately 1-3 day(s).   The patient does have a current PCP (DIXON,MARY BETH, PA-C) and does not need an [facility] hospital follow-up appointment after discharge.    Is the [facility] hospital follow-up appointment a one-time only appointment? not applicable.  Does the patient have transportation limitations that hinder transportation to clinic appointments? yes   SERVICE NEEDED AT [place]         Y = Yes, Blank = No PT:   OT:   RN:   Equipment:   Other:      Length of Stay: 2 day(s)   Signed: Charlesetta Shanks, MD  PGY-1, Internal Medicine Resident  Pager: [phone] (7AM-5PM) 05/05/2014, 11:37 AM

## 2014-05-05 NOTE — Progress Notes (Signed)
Inpatient Diabetes Program Recommendations  AACE/ADA: New Consensus Statement on Inpatient Glycemic Control (2013)  Target Ranges:  Prepandial:   less than 140 mg/dL      Peak postprandial:   less than 180 mg/dL (1-2 hours)      Critically ill patients:  140 - 180 mg/dL    Results for Laura Best, Laura Best (MRN [id number]) as of 05/05/2014 13:57  Ref. Range 05/04/2014 21:04 05/04/2014 23:54 05/05/2014 05:11 05/05/2014 07:34 05/05/2014 11:02  Glucose-Capillary Latest Range: 70-99 mg/dL 266 (H) 228 (H) 202 (H) 206 (H) 203 (H)   Needs basal insulin. On Levemir 22 units QHS at home. NPO at present.  Please consider starting Levemir 15 units QHS. Titrate until FBS < 180 mg/dL.  Will continue to follow. Thank you. Lorenda Peck, RD, LDN, CDE Inpatient Diabetes Coordinator [phone]

## 2014-05-05 NOTE — Progress Notes (Addendum)
Patient seen and examined. Case d/w residents in detail. I agree with findings and plan as documented in Dr. Vivia Budge note  Patient now with worsening abd pain and flank pain with recurrent n/v. Patient with diffuse moderate abd tenderness and + CVA tenderness today. Continue with NPO for now. Would d/c cipro and start ceftriaxone. Will f/u sensitivities of E. Coli on urine cx. C/w pain control. Patient clinically with likely pyelonephritis.  Patient also noted to have sigmoid diverticulitis. C/w ceftriaxone and flagyl for now. Repeat abd x ray with no perforation.  Patient with BS in the 200s - Would start levemir.

## 2014-05-05 NOTE — Progress Notes (Signed)
ANTIBIOTIC CONSULT NOTE - INITIAL  Pharmacy Consult for Rocephin Indication: pyelonephritis  Allergies  Allergen Reactions  . Ace Inhibitors Other (See Comments)    Hyperkalemia  . Actos [Pioglitazone] Swelling  . Aspirin Other (See Comments)    G.I. Upset.   . Metformin And Related Diarrhea    Patient Measurements: Height: 5\' 3"  (160 cm) Weight: 163 lb 5.8 oz (74.1 kg) IBW/kg (Calculated) : 52.4  Vital Signs: Temp: 98.8 F (37.1 C) (04/12 0902) Temp Source: Oral (04/12 0902) BP: 155/58 mmHg (04/12 0902) Pulse Rate: 110 (04/12 0902) Intake/Output from previous day: 04/11 0701 - 04/12 0700 In: 1347.5 [P.O.:360; I.V.:437.5; IV Piggyback:550] Out: [id number] [Urine:700; Emesis/NG output:4] Intake/Output from this shift:    Labs:  Recent Labs  05/03/14 0532 05/04/14 0435 05/05/14 0600  WBC 8.4 8.8 16.7*  HGB 12.2 11.9* 11.7*  PLT 191 194 196  CREATININE 1.60* 1.81* 1.67*   Estimated Creatinine Clearance: 28.9 mL/min (by C-G formula based on Cr of 1.67).  Microbiology: Recent Results (from the past 720 hour(s))  Culture, Urine     Status: None (Preliminary result)   Collection Time: 05/03/14  6:45 AM  Result Value Ref Range Status   Specimen Description URINE, RANDOM  Final   Special Requests NONE  Final   Colony Count   Final    >=100,000 COLONIES/ML Performed at [facility]    Culture   Final    ESCHERICHIA COLI Performed at [facility]    Report Status PENDING  Incomplete    Medical History: Past Medical History  Diagnosis Date  . Diabetes mellitus   . Hypertension   . Hyperlipidemia   . Tricuspid regurgitation     Echo 05/02/06-Nml LV. Mild MR. Mod TR  . Osteoporosis   . Diastolic heart failure   . Vitamin D deficiency   . Osteoporosis   . CKD (chronic kidney disease) stage 3, GFR 30-59 ml/min 12/25/2012    Medications:  Scheduled:  . amLODipine  10 mg Oral Daily  . cholecalciferol  1,000 Units Oral Daily  . heparin  5,000  Units Subcutaneous 3 times per day  . insulin aspart  0-9 Units Subcutaneous Q6H  . metronidazole  500 mg Intravenous Q6H  . potassium chloride  10 mEq Intravenous Q1 Hr x 3  . simvastatin  20 mg Oral Q breakfast   Assessment: 74 yo F initially started on Cipro for presumed pyelonephritis.  Today pt is clinically worse with increased WBC and tachycardia.  To change abx to Rocephin.  Pt has baseline CKD with SCr 1.8, however, Rocephin does not require renal dose adjustment.  Urine cx = Ecoli, sens pending  Goal of Therapy:  Eradication of Infection  Plan:  Rocephin 1gm IV q24h Rx will sign off.  No further dose adjustments anticipated. Culture monitoring software will alert to final cx results and if antibiotics need to be changes based on resistance patters.  [employer], Pharm.D., BCPS Clinical Pharmacist Pager [phone] 05/05/2014 1:13 PM

## 2014-05-06 LAB — BASIC METABOLIC PANEL
Anion gap: 10 (ref 5–15)
BUN: 25 mg/dL — AB (ref 6–23)
CALCIUM: 8.2 mg/dL — AB (ref 8.4–10.5)
CHLORIDE: 108 mmol/L (ref 96–112)
CO2: 22 mmol/L (ref 19–32)
CREATININE: 1.83 mg/dL — AB (ref 0.50–1.10)
GFR calc Af Amer: 30 mL/min — ABNORMAL LOW (ref >90)
GFR calc non Af Amer: 26 mL/min — ABNORMAL LOW (ref >90)
GLUCOSE: 256 mg/dL — AB (ref 70–99)
Potassium: 3.6 mmol/L (ref 3.5–5.1)
Sodium: 140 mmol/L (ref 135–145)

## 2014-05-06 LAB — CBC
HEMATOCRIT: 36.7 % (ref 36.0–46.0)
HEMOGLOBIN: 11.7 g/dL — AB (ref 12.0–15.0)
MCH: 27 pg (ref 26.0–34.0)
MCHC: 31.9 g/dL (ref 30.0–36.0)
MCV: 84.6 fL (ref 78.0–100.0)
Platelets: 184 10*3/uL (ref 150–400)
RBC: 4.34 MIL/uL (ref 3.87–5.11)
RDW: 15 % (ref 11.5–15.5)
WBC: 11.8 10*3/uL — ABNORMAL HIGH (ref 4.0–10.5)

## 2014-05-06 LAB — GLUCOSE, CAPILLARY
GLUCOSE-CAPILLARY: 221 mg/dL — AB (ref 70–99)
GLUCOSE-CAPILLARY: 224 mg/dL — AB (ref 70–99)
GLUCOSE-CAPILLARY: 241 mg/dL — AB (ref 70–99)
Glucose-Capillary: 243 mg/dL — ABNORMAL HIGH (ref 70–99)
Glucose-Capillary: 281 mg/dL — ABNORMAL HIGH (ref 70–99)
Glucose-Capillary: 195 mg/dL — ABNORMAL HIGH (ref 70–99)

## 2014-05-06 MED ORDER — INSULIN DETEMIR 100 UNIT/ML ~~LOC~~ SOLN
10.0000 [IU] | Freq: Every day | SUBCUTANEOUS | Status: DC
  Administered 2014-05-06 – 2014-05-07 (×2): 10 [IU] via SUBCUTANEOUS
  Filled 2014-05-06 (×3): qty 0.1

## 2014-05-06 MED ORDER — INSULIN ASPART 100 UNIT/ML ~~LOC~~ SOLN
0.0000 [IU] | Freq: Three times a day (TID) | SUBCUTANEOUS | Status: DC
  Administered 2014-05-06: 8 [IU] via SUBCUTANEOUS
  Administered 2014-05-07: 5 [IU] via SUBCUTANEOUS
  Administered 2014-05-07 (×2): 3 [IU] via SUBCUTANEOUS
  Administered 2014-05-08: 5 [IU] via SUBCUTANEOUS
  Administered 2014-05-08: 3 [IU] via SUBCUTANEOUS

## 2014-05-06 MED ORDER — INSULIN ASPART 100 UNIT/ML ~~LOC~~ SOLN
0.0000 [IU] | Freq: Every day | SUBCUTANEOUS | Status: DC
  Administered 2014-05-07: 2 [IU] via SUBCUTANEOUS

## 2014-05-06 NOTE — Progress Notes (Signed)
Patient seen and examined. Case d/w residents in detail. I agree with findings and plan as documented in Dr. Vivia Budge note.   Patient is much improved today. Flank pain and abd pain have resolved. Started on clear liquid diet. Would c/w ceftriaxone and flagyl for diverticulitis and attempt to advance diet in AM.  Patient also clinically with evidence of pyelonephritis. She is much improved. Will c/w ceftriaxone and change to PO abx in AM to complete 7 day course.  Start levemir for DM. BS elevated to the 200s

## 2014-05-06 NOTE — Care Management Note (Unsigned)
    Page 1 of 1   05/06/2014     11:33:53 AM CARE MANAGEMENT NOTE 05/06/2014  Patient:  Laura Best, Laura Best   Account Number:  [id number]  Date Initiated:  05/06/2014  Documentation initiated by:  Carlyle Achenbach  Subjective/Objective Assessment:   Pt adm on 05/03/14 with abd pain, acute diverticulitis. PTA, pt resides at home with son.     Action/Plan:   Will follow for dc needs as pt progresses.   Anticipated DC Date:  05/08/2014   Anticipated DC Plan:  [place]  CM consult      Choice offered to / List presented to:             Status of service:  In process, will continue to follow Medicare Important Message given?  YES (If response is "NO", the following Medicare IM given date fields will be blank) Date Medicare IM given:  05/06/2014 Medicare IM given by:  Kaylianna Detert Date Additional Medicare IM given:   Additional Medicare IM given by:    Discharge Disposition:    Per UR Regulation:  Reviewed for med. necessity/level of care/duration of stay  If discussed at [place] of Stay Meetings, dates discussed:    Comments:

## 2014-05-06 NOTE — Progress Notes (Signed)
Subjective:    The patient reports improvement of her abdominal pain. She had one episode of vomiting last night, but it was much less than prior. She reports feeling much better today, and she would like to try some food again.  Interval Events: -Slightly tachycardic this morning.   Objective:    Vital Signs:   Temp:  [98.4 F (36.9 C)-99.7 F (37.6 C)] 99.7 F (37.6 C) (04/13 0915) Pulse Rate:  [100-109] 106 (04/13 0915) Resp:  [17-18] 18 (04/13 0500) BP: (147-160)/(60-77) 157/77 mmHg (04/13 0915) SpO2:  [90 %-95 %] 90 % (04/13 0915) Weight:  [164 lb 3.9 oz (74.5 kg)] 164 lb 3.9 oz (74.5 kg) (04/12 2034) Last BM Date: 05/04/14  24-hour weight change: Weight change: 14.1 oz (0.4 kg)  Intake/Output:   Intake/Output Summary (Last 24 hours) at 05/06/14 1555 Last data filed at 05/06/14 1100  Gross per 24 hour  Intake    640 ml  Output    950 ml  Net   -310 ml      Physical Exam: General: Well-developed, well-nourished, in NAD, appropriate and cooperative throughout examination.  Lungs:  Normal respiratory effort. Clear to auscultation BL without crackles or wheezes.  Heart: RRR. S1 and S2 normal without gallop, murmur, or rubs.  Abdomen:  Non-tender, non-distended, no masses.  Extremities: No pretibial edema.     Labs:  Basic Metabolic Panel:  Recent Labs Lab 05/03/14 0532 05/04/14 0435 05/05/14 0600 05/06/14 0529  NA 138 141 141 140  K 3.5 3.3* 3.3* 3.6  CL 103 103 103 108  CO2 26 29 25 22   GLUCOSE 211* 318* 209* 256*  BUN 18 20 22  25*  CREATININE 1.60* 1.81* 1.67* 1.83*  CALCIUM 9.3 9.1 8.7 8.2*    Liver Function Tests:  Recent Labs Lab 05/03/14 0532 05/04/14 0435  AST 17 20  ALT 14 16  ALKPHOS 75 70  BILITOT 0.4 0.5  PROT 7.5 7.4  ALBUMIN 3.8 3.4*    Recent Labs Lab 05/03/14 0532  LIPASE 28   CBC:  Recent Labs Lab 05/03/14 0532 05/04/14 0435 05/05/14 0600 05/06/14 0529  WBC 8.4 8.8 16.7* 11.8*  NEUTROABS 6.0  --   --   --    HGB 12.2 11.9* 11.7* 11.7*  HCT 36.6 36.0 36.2 36.7  MCV 83.0 82.9 84.0 84.6  PLT 191 194 196 184   CBG:  Recent Labs Lab 05/05/14 2034 05/06/14 0003 05/06/14 0453 05/06/14 0909 05/06/14 1153  GLUCAP 221* 221* 243* 241* 224*    Microbiology: Results for orders placed or performed during the hospital encounter of 05/03/14  Culture, Urine     Status: None   Collection Time: 05/03/14  6:45 AM  Result Value Ref Range Status   Specimen Description URINE, RANDOM  Final   Special Requests NONE  Final   Colony Count   Final    >=100,000 COLONIES/ML Performed at [place]   Final    ESCHERICHIA COLI Performed at [facility]    Report Status 05/05/2014 FINAL  Final   Organism ID, Bacteria ESCHERICHIA COLI  Final      Susceptibility   Escherichia coli - MIC*    AMPICILLIN 4 SENSITIVE Sensitive     CEFAZOLIN <=4 SENSITIVE Sensitive     CEFTRIAXONE <=1 SENSITIVE Sensitive     CIPROFLOXACIN <=0.25 SENSITIVE Sensitive     GENTAMICIN <=1 SENSITIVE Sensitive     LEVOFLOXACIN <=0.12 SENSITIVE Sensitive  NITROFURANTOIN <=16 SENSITIVE Sensitive     TOBRAMYCIN <=1 SENSITIVE Sensitive     TRIMETH/SULFA <=20 SENSITIVE Sensitive     PIP/TAZO <=4 SENSITIVE Sensitive     * ESCHERICHIA COLI    Imaging: Dg Abd 2 Views  05/05/2014   CLINICAL DATA:  Acute upper abdominal pain.  EXAM: ABDOMEN - 2 VIEW  COMPARISON:  September 27, 2013.  FINDINGS: The bowel gas pattern is normal. There is no evidence of free air. Vascular calcifications are seen in the pelvis.  IMPRESSION: No evidence bowel obstruction or ileus.   Electronically Signed   By: Marijo Conception, M.D.   On: 05/05/2014 08:50       Medications:    Infusions:    Scheduled Medications: . amLODipine  10 mg Oral Daily  . cefTRIAXone (ROCEPHIN)  IV  1 g Intravenous Q24H  . cholecalciferol  1,000 Units Oral Daily  . heparin  5,000 Units Subcutaneous 3 times per day  . insulin aspart  0-15 Units  Subcutaneous TID WC  . metronidazole  500 mg Intravenous Q6H  . simvastatin  20 mg Oral Q breakfast    PRN Medications: HYDROmorphone (DILAUDID) injection, promethazine, sodium chloride   Assessment/ Plan:    Principal Problem:   Acute diverticulitis Active Problems:   Hypertension   Diabetes type 2, uncontrolled   CKD (chronic kidney disease) stage 3, GFR 30-59 ml/min   Left ventricular diastolic dysfunction with preserved systolic function  #Pyelonephritis Symptoms have improved significantly, and her white blood cell count is improved today. Her urine culture grew pansensitive Escherichia coli, so it is unclear why switching from Cipro to ceftriaxone caused the improvement. However, this could just be fluctuation of her infection. We will advance her diet slowly. -Continue ceftriaxone IV, day 4. Continue for 10-14 days. -Clear liquid diet. Advance tomorrow if tolerated. -Dilaudid 0.5 mg every 4 hours as needed. -Phenergan 12.5 mg every 4 hours as needed. -Stop IV fluids. -PT eval and treat.  #Acute uncomplicated diverticulitis Symptoms improved as above. -Ceftriaxone as above. -Continue Flagyl IV, day 4.  Continue antibiotics for 10-14 days. -Repeat colonoscopy 6 weeks after recovery.  #Hypokalemia Resolved with cessation of vomiting. -Repeat BMP tomorrow.  #Diastolic congestive heart failure Kidneys to be euvolemic on exam. -Continue to hold Lasix in setting of infection.  #Chronic kidney disease, stage III Creatinine is increased but at baseline. -Continue to monitor.  #Type 2 diabetes Blood sugars in the low 200s. -CBG before meals and at bedtime. -Increase sliding scale to moderate. -Start Levemir 10 units daily at bedtime.  #Hypertension Blood pressure slightly elevated -Continue home amlodipine 10 mg daily. -Restart home clonidine if blood pressure remains elevated.  #Hyperlipidemia -Continue home Zocor 20 mg daily.  #Osteoporosis -Continue  vitamin D 1000 units daily.   DVT PPX - heparin  CODE STATUS - Full.  CONSULTS PLACED - None.  DISPO - Disposition is deferred at this time, awaiting improvement of infections.   Anticipated discharge in approximately 1-3 day(s).   The patient does have a current PCP (DIXON,MARY BETH, PA-C) and does not need an [facility] hospital follow-up appointment after discharge.    Is the [facility] hospital follow-up appointment a one-time only appointment? not applicable.  Does the patient have transportation limitations that hinder transportation to clinic appointments? yes   SERVICE NEEDED AT [place]         Y = Yes, Blank = No PT:   OT:   RN:  Equipment:   Other:      Length of Stay: 3 day(s)   Signed: Charlesetta Shanks, MD  PGY-1, Internal Medicine Resident Pager: [phone] (7AM-5PM) 05/06/2014, 3:55 PM

## 2014-05-06 NOTE — Progress Notes (Signed)
Results for Laura Best, Laura Best (MRN [id number]) as of 05/06/2014 12:07  Ref. Range 05/05/2014 20:34 05/06/2014 00:03 05/06/2014 04:53 05/06/2014 09:09 05/06/2014 11:53  Glucose-Capillary Latest Ref Range: 70-99 mg/dL 221 (H) 221 (H) 243 (H) 241 (H) 224 (H)   Needs basal insulin.  Add Levemir 15 units QHS (on 22 units at home)  Will continue to follow. Thank you. Lorenda Peck, RD, LDN, CDE Inpatient Diabetes Coordinator [phone]

## 2014-05-07 LAB — BASIC METABOLIC PANEL
Anion gap: 10 (ref 5–15)
BUN: 21 mg/dL (ref 6–23)
CHLORIDE: 106 mmol/L (ref 96–112)
CO2: 26 mmol/L (ref 19–32)
Calcium: 8 mg/dL — ABNORMAL LOW (ref 8.4–10.5)
Creatinine, Ser: 1.63 mg/dL — ABNORMAL HIGH (ref 0.50–1.10)
GFR calc non Af Amer: 30 mL/min — ABNORMAL LOW (ref >90)
GFR, EST AFRICAN AMERICAN: 35 mL/min — AB (ref >90)
Glucose, Bld: 199 mg/dL — ABNORMAL HIGH (ref 70–99)
POTASSIUM: 3.4 mmol/L — AB (ref 3.5–5.1)
Sodium: 142 mmol/L (ref 135–145)

## 2014-05-07 LAB — CBC
HEMATOCRIT: 37.7 % (ref 36.0–46.0)
Hemoglobin: 12.3 g/dL (ref 12.0–15.0)
MCH: 27.2 pg (ref 26.0–34.0)
MCHC: 32.6 g/dL (ref 30.0–36.0)
MCV: 83.4 fL (ref 78.0–100.0)
Platelets: 194 10*3/uL (ref 150–400)
RBC: 4.52 MIL/uL (ref 3.87–5.11)
RDW: 14.9 % (ref 11.5–15.5)
WBC: 11.3 10*3/uL — ABNORMAL HIGH (ref 4.0–10.5)

## 2014-05-07 LAB — GLUCOSE, CAPILLARY
GLUCOSE-CAPILLARY: 204 mg/dL — AB (ref 70–99)
Glucose-Capillary: 181 mg/dL — ABNORMAL HIGH (ref 70–99)
Glucose-Capillary: 168 mg/dL — ABNORMAL HIGH (ref 70–99)
Glucose-Capillary: 223 mg/dL — ABNORMAL HIGH (ref 70–99)
Glucose-Capillary: 243 mg/dL — ABNORMAL HIGH (ref 70–99)

## 2014-05-07 LAB — CLOSTRIDIUM DIFFICILE BY PCR: Toxigenic C. Difficile by PCR: NEGATIVE

## 2014-05-07 MED ORDER — AMOXICILLIN-POT CLAVULANATE 875-125 MG PO TABS
1.0000 | ORAL_TABLET | Freq: Two times a day (BID) | ORAL | Status: DC
  Administered 2014-05-07 – 2014-05-08 (×3): 1 via ORAL
  Filled 2014-05-07 (×5): qty 1

## 2014-05-07 MED ORDER — POTASSIUM CHLORIDE CRYS ER 20 MEQ PO TBCR
40.0000 meq | EXTENDED_RELEASE_TABLET | Freq: Two times a day (BID) | ORAL | Status: DC
  Administered 2014-05-07 – 2014-05-08 (×3): 40 meq via ORAL
  Filled 2014-05-07 (×4): qty 2

## 2014-05-07 MED ORDER — SACCHAROMYCES BOULARDII 250 MG PO CAPS
250.0000 mg | ORAL_CAPSULE | Freq: Two times a day (BID) | ORAL | Status: DC
  Administered 2014-05-07 – 2014-05-08 (×3): 250 mg via ORAL
  Filled 2014-05-07 (×4): qty 1

## 2014-05-07 NOTE — Progress Notes (Signed)
Patient seen and examined. Case d/w residents in detail. I agree with findings and plan as documented in Dr. Vivia Budge note.  Patient complained of 3 episodes of diarrhea overnight and 1 this AM. She was tolerating clears yesterday.  Will transition abx to PO augmentin to complete 7 day course and d/c rocephin and flagyl. Will advance to soft diet today. C. Diff negative. No further w/u for now. Will likely d/c home in AM if tolerating PO

## 2014-05-07 NOTE — Discharge Summary (Signed)
Name: Laura Best MRN: [id number] DOB: 1940/03/08 74 y.o. PCP: Orlena Sheldon, PA-C  Date of Admission: 05/03/2014  4:32 AM Date of Discharge: 05/08/2014 Attending Physician: Aldine Contes, MD  Discharge Diagnosis: Principal Problem:   Acute diverticulitis Active Problems:   Hypertension   Diabetes type 2, uncontrolled   CKD (chronic kidney disease) stage 3, GFR 30-59 ml/min   Left ventricular diastolic dysfunction with preserved systolic function  Discharge Medications:   Medication List    STOP taking these medications        furosemide 40 MG tablet  Commonly known as:  LASIX      TAKE these medications        acetaminophen 500 MG tablet  Commonly known as:  TYLENOL  Take 500 mg by mouth every 6 (six) hours as needed for pain.     amLODipine 10 MG tablet  Commonly known as:  NORVASC  TAKE ONE TABLET BY MOUTH ONCE DAILY     amoxicillin-clavulanate 875-125 MG per tablet  Commonly known as:  AUGMENTIN  Take 1 tablet by mouth every 12 (twelve) hours.     cholecalciferol 1000 UNITS tablet  Commonly known as:  VITAMIN D  Take 1 tablet (1,000 Units total) by mouth daily.     cloNIDine 0.2 MG tablet  Commonly known as:  CATAPRES  Take 1 tablet (0.2 mg total) by mouth 2 (two) times daily.     insulin aspart 100 UNIT/ML FlexPen  Commonly known as:  NOVOLOG  Inject 5 Units into the skin daily before supper. Take 15 min before supper or immediately after eating supper     Insulin Detemir 100 UNIT/ML Pen  Commonly known as:  LEVEMIR FLEXTOUCH  Inject 22 Units into the skin daily at 10 pm.     loperamide 2 MG capsule  Commonly known as:  IMODIUM  Take 1 capsule (2 mg total) by mouth as needed for diarrhea or loose stools.     RELION SHORT PEN NEEDLES 31G X 8 MM Misc  Generic drug:  Insulin Pen Needle  USE ONE PEN NEEDLE ONCE DAILY AS DIRECTED     saccharomyces boulardii 250 MG capsule  Commonly known as:  FLORASTOR  Take 1 capsule (250 mg total) by mouth 2  (two) times daily.     simvastatin 20 MG tablet  Commonly known as:  ZOCOR  TAKE ONE TABLET BY MOUTH ONCE DAILY AT BEDTIME        Disposition and follow-up:   Laura Best was discharged from [facility] in Stable condition.  At the hospital follow up visit please address:  1.  Completion of antibiotics, restart Lasix if needed, vitamin D dose, colonoscopy 6 weeks after recovery.  2.  Labs / imaging needed at time of follow-up: CBG, BMP.  3.  Pending labs/ test needing follow-up: None.  Follow-up Appointments: Follow-up Information    Follow up with [facility], PA-C On 05/14/2014.   Specialty:  Physician Assistant   Why:  12:15 pm   Contact information:   [place] [address] [phone]       Discharge Instructions:  Thank you for allowing [place] to be involved in your healthcare while you were hospitalized at [facility].   Please note that there have been changes to your home medications.  --> PLEASE LOOK AT YOUR DISCHARGE MEDICATION LIST FOR DETAILS.   Please call your PCP if you have any questions or  concerns, or any difficulty getting any of your medications.  Please return to the ER if you have worsening of your symptoms or new severe symptoms arise.  It is important that you follow up with your primary care doctor.  Make sure to complete all of your antibiotics.  You can take loperamide as needed to help with your diarrhea. Make sure you stay well hydrated. Discharge Instructions    Call MD for:  difficulty breathing, headache or visual disturbances    Complete by:  As directed      Call MD for:  extreme fatigue    Complete by:  As directed      Call MD for:  hives    Complete by:  As directed      Call MD for:  persistant dizziness or light-headedness    Complete by:  As directed      Call MD for:  persistant nausea and vomiting    Complete by:  As directed      Call MD for:  redness,  tenderness, or signs of infection (pain, swelling, redness, odor or green/yellow discharge around incision site)    Complete by:  As directed      Call MD for:  severe uncontrolled pain    Complete by:  As directed      Call MD for:  temperature >100.4    Complete by:  As directed      Diet - low sodium heart healthy    Complete by:  As directed      Increase activity slowly    Complete by:  As directed            Consultations: None.  Procedures Performed:  Ct Abdomen Pelvis Wo Contrast  05/03/2014   CLINICAL DATA:  Left-sided abdominal pain and flank pain for 1 day. Hard stool.  EXAM: CT ABDOMEN AND PELVIS WITHOUT CONTRAST  TECHNIQUE: Multidetector CT imaging of the abdomen and pelvis was performed following the standard protocol without IV contrast.  COMPARISON:  Not available  FINDINGS: Liver, gallbladder, pancreas, spleen, and adrenal glands are within normal limits  Chronic changes of the kidneys without hydronephrosis or obvious mass  There is subtle wall thickening of the sigmoid colon associated with stranding in the adjacent fat and diverticulosis. Findings compatible with early acute diverticulitis. No evidence of extraluminal bowel gas. No evidence of abscess. There is extensive stool throughout the ascending and transverse colon. Splenic flexure and descending colon are decompressed. No obvious mass at the transition point on image 35.  Atherosclerotic calcifications  Moderate-sized hiatal hernia.  Pericardial calcification adjacent to the right atrium.  Bladder, uterus, and adnexa are unremarkable.  No free-fluid. No obvious retroperitoneal adenopathy by measurement criteria.  Advanced L5-S1 degenerative disc disease with vacuum. No vertebral compression.  IMPRESSION: Findings compatible with mild early acute sigmoid diverticulitis.   Electronically Signed   By: Marybelle Killings M.D.   On: 05/03/2014 08:19   Dg Abd 2 Views  05/05/2014   CLINICAL DATA:  Acute upper abdominal pain.   EXAM: ABDOMEN - 2 VIEW  COMPARISON:  September 27, 2013.  FINDINGS: The bowel gas pattern is normal. There is no evidence of free air. Vascular calcifications are seen in the pelvis.  IMPRESSION: No evidence bowel obstruction or ileus.   Electronically Signed   By: Marijo Conception, M.D.   On: 05/05/2014 08:50   Admission HPI:  Laura Best is a 74 year old woman with history of type 2 diabetes, hypertension,  hyperlipidemia, congestive heart failure, osteoporosis, CKD stage III, and appendectomy one year ago presenting with abdominal pain. She reports developing left-sided abdominal pain approximately 2 days ago that is gradually been worsening. The pain is currently severe, sharp and radiates to her left flank. She reports being constipated and passing only small hard stools. She reports increasing nausea and vomiting. She has thrown up 2 or 3 times in the past day, including during the interview. Her vomitus is clear and nonbloody. She denies any hematochezia or melena. She denies dysuria or frequency, but she reports some urinary incontinence during vomiting. She also notes that her urine has been darker than normal for the past day and foul-smelling. She also reports mild subjective fevers and chills. Her pain is not associated with bowel movements or food intake, but she has been unable to take much food orally over the past day due to her nausea.  In the ER, abdominal CT was consistent with mild early acute sigmoid diverticulitis. She was started on ceftriaxone and Flagyl IV. She was given Zofran for nausea and fentanyl for pain. She was bolused with 500 mL normal saline. She was admitted for pain control.  Hospital Course by problem list: Principal Problem:   Acute diverticulitis Active Problems:   Hypertension   Diabetes type 2, uncontrolled   CKD (chronic kidney disease) stage 3, GFR 30-59 ml/min   Left ventricular diastolic dysfunction with preserved systolic function   #Pyelonephritis The  patient presented with left lower quadrant pain associated with flank pain and subjective fevers. She also reported foul-smelling urine and incontinence. Urinalysis was consistent with infection. She had received 1 dose of IV ceftriaxone in the ER, and she was switched to ciprofloxacin to cover both her uncomplicated diverticulitis and pyelonephritis. She was given that promethazine and Dilaudid to control her nausea and vomiting.  She initially improved, but she developed recurrent nausea and vomiting after her diet was advanced. She was switched back to ceftriaxone with resolution of her symptoms. Urine culture was positive for pansensitive Escherichia coli, so she was switched to Augmentin to cover both pyelonephritis and diverticulitis. Her diet was advanced, she was discharged with a prescription to complete a total of 10 days of antibiotics.  #Acute uncomplicated diverticulitis During workup for left lower quadrant abdominal pain in the ER, CT of her abdomen showed mild early sigmoid diverticulitis. She previously had a colonoscopy in 2010, that was reportedly normal. She was initially started on IV Cipro and Flagyl, which was switched to IV ceftriaxone and Flagyl, then Augmentin as described above. Her symptoms had improved prior to discharge and she was tolerating a full diet. She should have a follow-up colonoscopy in 6 weeks to evaluate the cause of her diverticulitis.  #Antibiotic associated diarrhea Four days into the admission, she developed watery diarrhea associated with crampy abdominal pain. She was put on enteric precautions and C. difficile PCR was negative. She was given probiotics to help with resolution of her antibiotic associated diarrhea along with loperamide as needed.  #Hypokalemia She developed hypokalemia secondary to diarrhea and vomiting, and she was given potassium supplementation.  #Diastolic congestive heart failure She had a history of grade 2 diastolic dysfunction on  echocardiogram from December 2014. She was euvolemic on exam. Her home Lasix was held during the admission and at discharge given her diarrhea. Please assess if she should restart this medication at follow-up.  #Chronic kidney disease, stage III Her creatinine was at her baseline of 1.6 on presentation and remained stable during  hospitalization.  #Type 2 diabetes She was maintained on Levemir 10 units daily at bedtime and sliding scale insulin during the hospitalization.  #Hypertension She was normotensive on presentation off of her home medications. Her home regimen was restarted as tolerated.  #Hyperlipidemia Her home Zocor was continued when she was tolerating oral medications.  #Osteoporosis She is noted to be on vitamin D 4,000 units daily on presentation. Due to concern for toxicity, her dose was reduced to 1000 units daily during the hospitalization and at discharge.  Discharge Vitals:   BP 146/67 mmHg  Pulse 87  Temp(Src) 99 F (37.2 C) (Oral)  Resp 18  Ht 5\' 3"  (1.6 m)  Wt 167 lb 12.3 oz (76.1 kg)  BMI 29.73 kg/m2  SpO2 96%  Discharge Labs:  Results for orders placed or performed during the hospital encounter of 05/03/14 (from the past 24 hour(s))  Glucose, capillary     Status: Abnormal   Collection Time: 05/07/14 11:55 AM  Result Value Ref Range   Glucose-Capillary 168 (H) 70 - 99 mg/dL   Comment 1 Document in Chart   Glucose, capillary     Status: Abnormal   Collection Time: 05/07/14  4:34 PM  Result Value Ref Range   Glucose-Capillary 223 (H) 70 - 99 mg/dL  Glucose, capillary     Status: Abnormal   Collection Time: 05/07/14  9:32 PM  Result Value Ref Range   Glucose-Capillary 243 (H) 70 - 99 mg/dL  Glucose, capillary     Status: Abnormal   Collection Time: 05/07/14 10:30 PM  Result Value Ref Range   Glucose-Capillary 204 (H) 70 - 99 mg/dL  Glucose, capillary     Status: Abnormal   Collection Time: 05/08/14  8:23 AM  Result Value Ref Range    Glucose-Capillary 184 (H) 70 - 99 mg/dL    Signed: Charlesetta Shanks, MD 05/08/2014, 11:39 AM    Services Ordered on Discharge: Home health PT. Equipment Ordered on Discharge: Shower Stool.

## 2014-05-07 NOTE — Progress Notes (Signed)
Subjective:    The patient reports that her abdominal pain is much improved this morning.  She denies any nausea or vomiting, and she says she tolerated a clear diet without issue.  She reports having 3 loose bowel movements last night associated with some crampy abdominal pain.  Interval Events: -Vital signs stable overnight.    Objective:    Vital Signs:   Temp:  [98.5 F (36.9 C)-99.7 F (37.6 C)] 98.7 F (37.1 C) (04/14 0440) Pulse Rate:  [91-106] 91 (04/14 0440) Resp:  [17-18] 18 (04/14 0440) BP: (144-157)/(63-77) 156/73 mmHg (04/14 0440) SpO2:  [90 %-93 %] 93 % (04/14 0440) Weight:  [165 lb 6.2 oz (75.02 kg)] 165 lb 6.2 oz (75.02 kg) (04/13 2045) Last BM Date: 05/04/14  24-hour weight change: Weight change: 1 lb 2.4 oz (0.52 kg)  Intake/Output:   Intake/Output Summary (Last 24 hours) at 05/07/14 0829 Last data filed at 05/07/14 0440  Gross per 24 hour  Intake    120 ml  Output    575 ml  Net   -455 ml      Physical Exam: General: Well-developed, well-nourished, in NAD, appropriate and cooperative throughout examination.  Lungs:  Normal respiratory effort. Clear to auscultation BL without crackles or wheezes.  Heart: RRR. S1 and S2 normal without gallop, murmur, or rubs.  Abdomen:  Mild diffuse tenderness, greatest in LLQ.  Extremities: No pretibial edema.     Labs:  Basic Metabolic Panel:  Recent Labs Lab 05/03/14 0532 05/04/14 0435 05/05/14 0600 05/06/14 0529  NA 138 141 141 140  K 3.5 3.3* 3.3* 3.6  CL 103 103 103 108  CO2 26 29 25 22   GLUCOSE 211* 318* 209* 256*  BUN 18 20 22  25*  CREATININE 1.60* 1.81* 1.67* 1.83*  CALCIUM 9.3 9.1 8.7 8.2*    Liver Function Tests:  Recent Labs Lab 05/03/14 0532 05/04/14 0435  AST 17 20  ALT 14 16  ALKPHOS 75 70  BILITOT 0.4 0.5  PROT 7.5 7.4  ALBUMIN 3.8 3.4*    Recent Labs Lab 05/03/14 0532  LIPASE 28   CBC:  Recent Labs Lab 05/03/14 0532 05/04/14 0435 05/05/14 0600 05/06/14 0529   WBC 8.4 8.8 16.7* 11.8*  NEUTROABS 6.0  --   --   --   HGB 12.2 11.9* 11.7* 11.7*  HCT 36.6 36.0 36.2 36.7  MCV 83.0 82.9 84.0 84.6  PLT 191 194 196 184   CBG:  Recent Labs Lab 05/06/14 0909 05/06/14 1153 05/06/14 1624 05/06/14 2044 05/07/14 0740  GLUCAP 241* 224* 281* 195* 181*    Microbiology: Results for orders placed or performed during the hospital encounter of 05/03/14  Culture, Urine     Status: None   Collection Time: 05/03/14  6:45 AM  Result Value Ref Range Status   Specimen Description URINE, RANDOM  Final   Special Requests NONE  Final   Colony Count   Final    >=100,000 COLONIES/ML Performed at [place]   Final    ESCHERICHIA COLI Performed at [facility]    Report Status 05/05/2014 FINAL  Final   Organism ID, Bacteria ESCHERICHIA COLI  Final      Susceptibility   Escherichia coli - MIC*    AMPICILLIN 4 SENSITIVE Sensitive     CEFAZOLIN <=4 SENSITIVE Sensitive     CEFTRIAXONE <=1 SENSITIVE Sensitive     CIPROFLOXACIN <=0.25 SENSITIVE Sensitive     GENTAMICIN <=1 SENSITIVE Sensitive  LEVOFLOXACIN <=0.12 SENSITIVE Sensitive     NITROFURANTOIN <=16 SENSITIVE Sensitive     TOBRAMYCIN <=1 SENSITIVE Sensitive     TRIMETH/SULFA <=20 SENSITIVE Sensitive     PIP/TAZO <=4 SENSITIVE Sensitive     * ESCHERICHIA COLI    Imaging: Dg Abd 2 Views  05/05/2014   CLINICAL DATA:  Acute upper abdominal pain.  EXAM: ABDOMEN - 2 VIEW  COMPARISON:  September 27, 2013.  FINDINGS: The bowel gas pattern is normal. There is no evidence of free air. Vascular calcifications are seen in the pelvis.  IMPRESSION: No evidence bowel obstruction or ileus.   Electronically Signed   By: Marijo Conception, M.D.   On: 05/05/2014 08:50       Medications:    Infusions:    Scheduled Medications: . amLODipine  10 mg Oral Daily  . cefTRIAXone (ROCEPHIN)  IV  1 g Intravenous Q24H  . cholecalciferol  1,000 Units Oral Daily  . heparin  5,000 Units  Subcutaneous 3 times per day  . insulin aspart  0-15 Units Subcutaneous TID WC  . insulin aspart  0-5 Units Subcutaneous QHS  . insulin detemir  10 Units Subcutaneous QHS  . metronidazole  500 mg Intravenous Q6H  . simvastatin  20 mg Oral Q breakfast    PRN Medications: HYDROmorphone (DILAUDID) injection, promethazine, sodium chloride   Assessment/ Plan:    Principal Problem:   Acute diverticulitis Active Problems:   Hypertension   Diabetes type 2, uncontrolled   CKD (chronic kidney disease) stage 3, GFR 30-59 ml/min   Left ventricular diastolic dysfunction with preserved systolic function  #Pyelonephritis Symptoms continue to improve.  Tolerated a liquid diet yesterday without issues.  Some mild tenderness on exam. -Switch to Augmentin BID to cover pyelo and diverticulitis. Continue for 10-14 days. -Advance diet to soft diet. -Dilaudid 0.5 mg every 4 hours as needed. -Phenergan 12.5 mg every 4 hours as needed. -PT eval and treat.  #Acute uncomplicated diverticulitis Symptoms stable as above.  -ABX as above. -Repeat colonoscopy 6 weeks after recovery.  #Watery diarrhea Cdiff vs. Antibiotic associated diarrhea.  She has received FQs and ceftriaxone, so she is at risk. Already on Flagyl though. -Send Cdiff toxin. -Enteric precautions. -Florastor BID.  #Hypokalemia Due to diarrhea. -Augmentin BID.  #Diastolic congestive heart failure Continues to be euvolemic on exam. -Continue to hold Lasix in setting of infection.  #Chronic kidney disease, stage III Labs pending from this morning. -Continue to monitor.  #Type 2 diabetes Blood sugars in the low 100s. -CBG before meals and at bedtime. -Continue sliding scale to moderate. -Continue Levemir 10 units daily at bedtime.  #Hypertension Blood pressure slightly elevated. -Continue home amlodipine 10 mg daily. -Consider restarting home clonidine if blood pressure remains elevated.  #Hyperlipidemia -Continue home  Zocor 20 mg daily.  #Osteoporosis -Continue vitamin D 1000 units daily.   DVT PPX - heparin  CODE STATUS - Full.  CONSULTS PLACED - None.  DISPO - Disposition is deferred at this time, awaiting improvement of infections.   Anticipated discharge in approximately 1-3 day(s).   The patient does have a current PCP (DIXON,MARY BETH, PA-C) and does not need an [facility] hospital follow-up appointment after discharge.    Is the [facility] hospital follow-up appointment a one-time only appointment? not applicable.  Does the patient have transportation limitations that hinder transportation to clinic appointments? yes   SERVICE NEEDED AT [place]         Y =  Yes, Blank = No PT:   OT:   RN:   Equipment:   Other:      Length of Stay: 4 day(s)   Signed: Charlesetta Shanks, MD  PGY-1, Internal Medicine Resident Pager: [phone] (7AM-5PM) 05/07/2014, 8:29 AM  cdif

## 2014-05-07 NOTE — Evaluation (Signed)
Physical Therapy Evaluation Patient Details Name: Laura Best MRN: [id number] DOB: 03/31/1940 Today's Date: 05/07/2014   History of Present Illness  Pt is a 74 year old woman with history of DMII, HTN, hyperlipidemia, CHF, osteoporosis, CKD stage III, and appendectomy one year ago presenting with abdominal pain. She reports developing left-sided abdominal pain approximately 2 days ago that is gradually been worsening. The pain is currently severe, sharp and radiates to her left flank. She reports being constipated and passing only small hard stools. She reports increasing nausea and vomiting. She has thrown up 2 or 3 times in the past day, including during the interview. Her pain is not associated with bowel movements or food intake, but she has been unable to take much food orally over the past day due to her nausea.  Clinical Impression  Pt admitted with above diagnosis. Pt currently with functional limitations due to the deficits listed below (see PT Problem List). At the time of PT eval pt was able to perform transfers and ambulation with min assist for balance and support. Pt fatigued from frequent trips to the [facility] due to diarrhea, and was SOB at end of gait training (~70 feet). Recommending one more PT visit prior to return home for further safety education/recommendations and possible use of RW/SPC for energy conservation. Pt will benefit from skilled PT to increase their independence and safety with mobility to allow discharge to the venue listed below.       Follow Up Recommendations Home health PT;Supervision for mobility/OOB    Equipment Recommendations  Possibly SPC; Shower chair/tub bench    Recommendations for Other Services       Precautions / Restrictions Precautions Precautions: Fall Restrictions Weight Bearing Restrictions: No      Mobility  Bed Mobility Overal bed mobility: Needs Assistance Bed Mobility: Supine to Sit     Supine to sit: Supervision     General  bed mobility comments: Supervision for safety. Pt appeared to have difficulty with scooting to EOB and demonstrated singificant leans with trunk for scooting hips.   Transfers Overall transfer level: Needs assistance Equipment used: 1 person hand held assist Transfers: Sit to/from Stand Sit to Stand: Min assist         General transfer comment: Steadying assist required as pt powered-up to full stand.   Ambulation/Gait Ambulation/Gait assistance: Min assist Ambulation Distance (Feet): 70 Feet Assistive device: 1 person hand held assist Gait Pattern/deviations: Step-through pattern;Decreased stride length;Trendelenburg;Trunk flexed Gait velocity: Decreased Gait velocity interpretation: Below normal speed for age/gender General Gait Details: Pt was able to ambulate in hall with 1 UE supported. Occasional assist provided for balance and support. Pt with DOE noted mainly at end of gait training. Symptoms resolved with seated rest break and O2 sats were in mid 90's on RA.   Stairs            Wheelchair Mobility    Modified Rankin (Stroke Patients Only)       Balance Overall balance assessment: Needs assistance Sitting-balance support: Feet supported;No upper extremity supported Sitting balance-Leahy Scale: Fair     Standing balance support: No upper extremity supported Standing balance-Leahy Scale: Fair Standing balance comment: Static standing - if dynamic pt requires assist                             Pertinent Vitals/Pain Pain Assessment: No/denies pain    Home Living Family/patient expects to be discharged to:: Private residence Living Arrangements:  Children Available Help at Discharge: Family;Available 24 hours/day Type of Home: Mobile home Home Access: Stairs to enter   Entrance Stairs-Number of Steps: Chart review says 1 step to enter however pt reports it is flat.  Home Layout: One level Home Equipment: Walker - 2 wheels;Bedside  commode Additional Comments: Family will be available 24 hours at least the first few days pt is home.     Prior Function Level of Independence: Independent         Comments: Was not using DME PTA     Hand Dominance        Extremity/Trunk Assessment   Upper Extremity Assessment: Defer to OT evaluation           Lower Extremity Assessment: Generalized weakness      Cervical / Trunk Assessment: Normal  Communication   Communication: No difficulties  Cognition Arousal/Alertness: Awake/alert Behavior During Therapy: WFL for tasks assessed/performed Overall Cognitive Status: Within Functional Limits for tasks assessed                      General Comments      Exercises        Assessment/Plan    PT Assessment Patient needs continued PT services  PT Diagnosis Difficulty walking;Generalized weakness   PT Problem List Decreased strength;Decreased range of motion;Decreased activity tolerance;Decreased balance;Decreased mobility;Decreased knowledge of use of DME;Decreased safety awareness;Decreased knowledge of precautions;Cardiopulmonary status limiting activity  PT Treatment Interventions DME instruction;Gait training;Stair training;Functional mobility training;Therapeutic activities;Therapeutic exercise;Neuromuscular re-education;Patient/family education   PT Goals (Current goals can be found in the Care Plan section) Acute Rehab PT Goals Patient Stated Goal: Home tomorrow PT Goal Formulation: With patient Time For Goal Achievement: 05/14/14 Potential to Achieve Goals: Good    Frequency Min 3X/week   Barriers to discharge        Co-evaluation               End of Session Equipment Utilized During Treatment: Gait belt Activity Tolerance: Patient limited by fatigue Patient left: in bed;with call bell/phone within reach;with bed alarm set Nurse Communication: Mobility status         Time: 1409-1430 PT Time Calculation (min) (ACUTE  ONLY): 21 min   Charges:   PT Evaluation $Initial PT Evaluation Tier I: 1 Procedure     PT G CodesRolinda Roan 05-09-2014, 2:58 PM   Rolinda Roan, PT, DPT Acute Rehabilitation Services Pager: [phone]

## 2014-05-07 NOTE — Care Management Note (Signed)
CARE MANAGEMENT NOTE 05/07/2014  Patient:  Laura Best, Laura Best   Account Number:  [id number]  Date Initiated:  05/06/2014  Documentation initiated by:  AMERSON,JULIE  Subjective/Objective Assessment:   Pt adm on 05/03/14 with abd pain, acute diverticulitis. PTA, pt resides at home with son.     Action/Plan:   Will follow for dc needs as pt progresses.   Anticipated DC Date:  05/08/2014   Anticipated DC Plan:  [place]  CM consult      Choice offered to / List presented to:             Status of service:  In process, will continue to follow Medicare Important Message given?  YES (If response is "NO", the following Medicare IM given date fields will be blank) Date Medicare IM given:  05/06/2014 Medicare IM given by:  AMERSON,JULIE Date Additional Medicare IM given:  05/07/2014 Additional Medicare IM given by:  [facility]  Discharge Disposition:    Per UR Regulation:  Reviewed for med. necessity/level of care/duration of stay  If discussed at [place] of Stay Meetings, dates discussed:    Comments:

## 2014-05-08 LAB — GLUCOSE, CAPILLARY
Glucose-Capillary: 184 mg/dL — ABNORMAL HIGH (ref 70–99)
Glucose-Capillary: 206 mg/dL — ABNORMAL HIGH (ref 70–99)

## 2014-05-08 MED ORDER — VITAMIN D 1000 UNITS PO TABS: 1000.0000 [IU] | ORAL_TABLET | Freq: Every day | ORAL | Status: DC

## 2014-05-08 MED ORDER — SACCHAROMYCES BOULARDII 250 MG PO CAPS: 250.0000 mg | ORAL_CAPSULE | Freq: Two times a day (BID) | ORAL | Status: DC

## 2014-05-08 MED ORDER — ACETAMINOPHEN 325 MG PO TABS: 650.0000 mg | ORAL_TABLET | Freq: Four times a day (QID) | ORAL | Status: DC | PRN

## 2014-05-08 MED ORDER — LOPERAMIDE HCL 2 MG PO CAPS: 2.0000 mg | ORAL_CAPSULE | ORAL | Status: DC | PRN

## 2014-05-08 MED ORDER — AMOXICILLIN-POT CLAVULANATE 875-125 MG PO TABS: 1.0000 | ORAL_TABLET | Freq: Two times a day (BID) | ORAL | Status: AC

## 2014-05-08 MED ORDER — LOPERAMIDE HCL 2 MG PO CAPS
2.0000 mg | ORAL_CAPSULE | ORAL | Status: DC | PRN
  Filled 2014-05-08: qty 1

## 2014-05-08 NOTE — Progress Notes (Signed)
Patient seen and examined. Case d/w residents in detail. I agree with findings and plan as documented in Dr. Vivia Budge note.  Patient still with episodes of diarrhea overnight. Abd pain has resolved. No fevers. Patient is tolerating PO  Patient is stable for d/c home today with outpatient f/u with PCP. Would give loperamide prn diarrhea (C.Diff negative). Complete 10 day course of abx with PO augmentin.  Continue with other medical management per Dr. Vivia Budge note

## 2014-05-08 NOTE — Progress Notes (Signed)
Subjective:    The patient feels much better this morning with no pain. She tolerated her dinner and breakfast this morning without any issues. She does continue to have some diarrhea.  Interval Events: -Vital signs stable overnight.  -Only able to eat a couple of nights with lunch yesterday, so kept overnight for observation.    Objective:    Vital Signs:   Temp:  [99.3 F (37.4 C)-100.1 F (37.8 C)] 99.5 F (37.5 C) (04/15 0453) Pulse Rate:  [87-93] 93 (04/15 0453) Resp:  [17-18] 18 (04/15 0453) BP: (141-161)/(56-64) 159/64 mmHg (04/15 0453) SpO2:  [94 %-96 %] 95 % (04/15 0453) Weight:  [165 lb 7.6 oz (75.06 kg)-167 lb 12.3 oz (76.1 kg)] 167 lb 12.3 oz (76.1 kg) (04/15 0439) Last BM Date: 05/08/14  24-hour weight change: Weight change: 1.4 oz (0.04 kg)  Intake/Output:   Intake/Output Summary (Last 24 hours) at 05/08/14 1056 Last data filed at 05/08/14 1040  Gross per 24 hour  Intake    480 ml  Output    801 ml  Net   -321 ml      Physical Exam: General: Well-developed, well-nourished, in NAD, appropriate and cooperative throughout examination.  Lungs:  Normal respiratory effort. Clear to auscultation BL without crackles or wheezes.  Heart: RRR. S1 and S2 normal without gallop, murmur, or rubs.  Abdomen:  Non-tender, non-distended.  Extremities: No pretibial edema.     Labs:  Basic Metabolic Panel:  Recent Labs Lab 05/03/14 0532 05/04/14 0435 05/05/14 0600 05/06/14 0529 05/07/14 0805  NA 138 141 141 140 142  K 3.5 3.3* 3.3* 3.6 3.4*  CL 103 103 103 108 106  CO2 26 29 25 22 26   GLUCOSE 211* 318* 209* 256* 199*  BUN 18 20 22  25* 21  CREATININE 1.60* 1.81* 1.67* 1.83* 1.63*  CALCIUM 9.3 9.1 8.7 8.2* 8.0*    Liver Function Tests:  Recent Labs Lab 05/03/14 0532 05/04/14 0435  AST 17 20  ALT 14 16  ALKPHOS 75 70  BILITOT 0.4 0.5  PROT 7.5 7.4  ALBUMIN 3.8 3.4*    Recent Labs Lab 05/03/14 0532  LIPASE 28   CBC:  Recent Labs Lab  05/03/14 0532 05/04/14 0435 05/05/14 0600 05/06/14 0529 05/07/14 0805  WBC 8.4 8.8 16.7* 11.8* 11.3*  NEUTROABS 6.0  --   --   --   --   HGB 12.2 11.9* 11.7* 11.7* 12.3  HCT 36.6 36.0 36.2 36.7 37.7  MCV 83.0 82.9 84.0 84.6 83.4  PLT 191 194 196 184 194   CBG:  Recent Labs Lab 05/07/14 1155 05/07/14 1634 05/07/14 2132 05/07/14 2230 05/08/14 0823  GLUCAP 168* 223* 243* 204* 184*    Microbiology: Results for orders placed or performed during the hospital encounter of 05/03/14  Culture, Urine     Status: None   Collection Time: 05/03/14  6:45 AM  Result Value Ref Range Status   Specimen Description URINE, RANDOM  Final   Special Requests NONE  Final   Colony Count   Final    >=100,000 COLONIES/ML Performed at [facility]    Culture   Final    ESCHERICHIA COLI Performed at [facility]    Report Status 05/05/2014 FINAL  Final   Organism ID, Bacteria ESCHERICHIA COLI  Final      Susceptibility   Escherichia coli - MIC*    AMPICILLIN 4 SENSITIVE Sensitive     CEFAZOLIN <=4 SENSITIVE Sensitive     CEFTRIAXONE <=  1 SENSITIVE Sensitive     CIPROFLOXACIN <=0.25 SENSITIVE Sensitive     GENTAMICIN <=1 SENSITIVE Sensitive     LEVOFLOXACIN <=0.12 SENSITIVE Sensitive     NITROFURANTOIN <=16 SENSITIVE Sensitive     TOBRAMYCIN <=1 SENSITIVE Sensitive     TRIMETH/SULFA <=20 SENSITIVE Sensitive     PIP/TAZO <=4 SENSITIVE Sensitive     * ESCHERICHIA COLI  Clostridium Difficile by PCR     Status: None   Collection Time: 05/07/14 11:10 AM  Result Value Ref Range Status   C difficile by pcr NEGATIVE NEGATIVE Final    Imaging: No results found.     Medications:    Infusions:    Scheduled Medications: . amLODipine  10 mg Oral Daily  . amoxicillin-clavulanate  1 tablet Oral Q12H  . cholecalciferol  1,000 Units Oral Daily  . heparin  5,000 Units Subcutaneous 3 times per day  . insulin aspart  0-15 Units Subcutaneous TID WC  . insulin aspart  0-5 Units  Subcutaneous QHS  . insulin detemir  10 Units Subcutaneous QHS  . potassium chloride  40 mEq Oral BID  . saccharomyces boulardii  250 mg Oral BID  . simvastatin  20 mg Oral Q breakfast    PRN Medications: HYDROmorphone (DILAUDID) injection, promethazine, sodium chloride   Assessment/ Plan:    Principal Problem:   Acute diverticulitis Active Problems:   Hypertension   Diabetes type 2, uncontrolled   CKD (chronic kidney disease) stage 3, GFR 30-59 ml/min   Left ventricular diastolic dysfunction with preserved systolic function  #Pyelonephritis Enzymes completely resolved, tolerating a diet without issues. -Continue Augmentin BID to cover pyelo and diverticulitis, day 6 of 10. -Heart healthy/card modified diet. -Discontinue Dilaudid. -Tylenol 650 mg every 6 hours as needed for pain. -Phenergan 12.5 mg every 4 hours as needed. -Home health PT.  #Acute uncomplicated diverticulitis Symptoms stable as above.  -ABX as above. -Repeat colonoscopy 6 weeks after recovery.  #Antibiotic associated diarrhea C. difficile negative. This will hopefully improve after switching to Augmentin. -Florastor BID. -Imodium when necessary. -Encouraged good hydration.  #Diastolic congestive heart failure Continues to be euvolemic on exam. -Continue to hold Lasix in setting of infection. -We will hold Lasix at discharge, and have her follow-up with her PCP.  #Chronic kidney disease, stage III Stable. -Continue to monitor.  #Type 2 diabetes Blood sugars stable. -CBG before meals and at bedtime. -Continue sliding scale to moderate. -Continue Levemir 10 units daily at bedtime.  #Hypertension Blood pressure slightly elevated. -Continue home amlodipine 10 mg daily. -Resume home blood pressure medications at discharge.  #Hyperlipidemia -Continue home Zocor 20 mg daily.  #Osteoporosis -Continue vitamin D 1000 units daily.   DVT PPX - heparin  CODE STATUS - Full.  CONSULTS PLACED -  None.  DISPO - Discharge today.  The patient does have a current PCP (DIXON,MARY BETH, PA-C) and does not need an [facility] hospital follow-up appointment after discharge.    Is the [facility] hospital follow-up appointment a one-time only appointment? not applicable.  Does the patient have transportation limitations that hinder transportation to clinic appointments? yes   SERVICE NEEDED AT [place]         Y = Yes, Blank = No PT:   OT:   RN:   Equipment:   Other:      Length of Stay: 5 day(s)   Signed: Charlesetta Shanks, MD  PGY-1, Internal Medicine Resident Pager: [phone] (7AM-5PM) 05/08/2014, 10:56 AM  cdif

## 2014-05-08 NOTE — Discharge Instructions (Signed)
·   Thank you for allowing [place] to be involved in your healthcare while you were hospitalized at [facility].   Please note that there have been changes to your home medications.  --> PLEASE LOOK AT YOUR DISCHARGE MEDICATION LIST FOR DETAILS.   Please call your PCP if you have any questions or concerns, or any difficulty getting any of your medications.  Please return to the ER if you have worsening of your symptoms or new severe symptoms arise.  It is important that you follow up with your primary care doctor.  Make sure to complete all of your antibiotics.  You can take loperamide as needed to help with your diarrhea. Make sure you stay well hydrated.

## 2014-05-08 NOTE — Progress Notes (Signed)
Physical Therapy Treatment Patient Details Name: KEIRAN SANDVIK MRN: [id number] DOB: September 25, 1940 Today's Date: 05/08/2014    History of Present Illness Pt is a 74 year old woman with history of DMII, HTN, hyperlipidemia, CHF, osteoporosis, CKD stage III, and appendectomy one year ago presenting with abdominal pain. She reports developing left-sided abdominal pain approximately 2 days ago that is gradually been worsening. The pain is currently severe, sharp and radiates to her left flank. She reports being constipated and passing only small hard stools. She reports increasing nausea and vomiting. She has thrown up 2 or 3 times in the past day, including during the interview. Her pain is not associated with bowel movements or food intake, but she has been unable to take much food orally over the past day due to her nausea.    PT Comments    Pt progressing well towards physical therapy goals. Pt was able to improve ambulation distance with RW use. Pt continues to report DOE, however tolerance for functional activity was improved with AD. Pt was educated on benefits of RW use at home, walking program, and equipment use for energy conservation at home [facility] [place] in bedroom at night, and using of shower chair for bathing). Discussed equipment recommendations with CM. Pt continues to be appropriate for HHPT to follow at d/c, to return to PLOF.   Follow Up Recommendations  Home health PT;Supervision for mobility/OOB     Equipment Recommendations  Other (comment) Sales executive chair)    Recommendations for Other Services       Precautions / Restrictions Precautions Precautions: Fall Restrictions Weight Bearing Restrictions: No    Mobility  Bed Mobility Overal bed mobility: Needs Assistance Bed Mobility: Supine to Sit     Supine to sit: Supervision     General bed mobility comments: Supervision for safety. Pt appeared to have difficulty with scooting to EOB and demonstrated singificant lateral leans  with trunk for scooting hips.   Transfers Overall transfer level: Needs assistance Equipment used: Rolling walker (2 wheeled) Transfers: Sit to/from Stand Sit to Stand: Supervision         General transfer comment: Supervision for safety with standing from EOB as well as from bedside chair. Pt demonstrated proper hand placement on seated surface for safety.   Ambulation/Gait Ambulation/Gait assistance: Supervision Ambulation Distance (Feet): 250 Feet Assistive device: Rolling walker (2 wheeled) Gait Pattern/deviations: Step-through pattern;Decreased stride length Gait velocity: Decreased Gait velocity interpretation: Below normal speed for age/gender General Gait Details: Pt with increased stability with RW use compared to previous session when pt only had HHA. No unsteadiness noted with walker use and pt reports more confidence with mobility. DOE noted, however O2 sats >90% on RA throughout session.    Stairs            Wheelchair Mobility    Modified Rankin (Stroke Patients Only)       Balance Overall balance assessment: Needs assistance Sitting-balance support: Feet supported;No upper extremity supported Sitting balance-Leahy Scale: Fair     Standing balance support: No upper extremity supported Standing balance-Leahy Scale: Fair Standing balance comment: Pt was able to mainain standing EOB during peri-care (~3 minutes) without unsteadiness. Occasional UE support on walker but no real LOB noted.                     Cognition Arousal/Alertness: Awake/alert Behavior During Therapy: WFL for tasks assessed/performed Overall Cognitive Status: Within Functional Limits for tasks assessed  Exercises      General Comments        Pertinent Vitals/Pain Pain Assessment: No/denies pain    Home Living                      Prior Function            PT Goals (current goals can now be found in the care plan section)  Acute Rehab PT Goals Patient Stated Goal: Home today - decrease diarrhea PT Goal Formulation: With patient Time For Goal Achievement: 05/14/14 Potential to Achieve Goals: Good Progress towards PT goals: Progressing toward goals    Frequency  Min 3X/week    PT Plan Current plan remains appropriate    Co-evaluation             End of Session Equipment Utilized During Treatment: Gait belt Activity Tolerance: Patient limited by fatigue Patient left: in bed;with call bell/phone within reach;with bed alarm set     Time: [id number] PT Time Calculation (min) (ACUTE ONLY): 33 min  Charges:  $Gait Training: 8-22 mins $Therapeutic Activity: 8-22 mins                    G Codes:      Rolinda Roan 05-13-2014, 10:24 AM   Rolinda Roan, PT, DPT Acute Rehabilitation Services Pager: [phone]

## 2014-05-08 NOTE — Care Management Note (Signed)
CARE MANAGEMENT NOTE 05/08/2014  Patient:  Laura Best, Laura Best   Account Number:  [id number]  Date Initiated:  05/06/2014  Documentation initiated by:  AMERSON,JULIE  Subjective/Objective Assessment:   Pt adm on 05/03/14 with abd pain, acute diverticulitis. PTA, pt resides at home with son.     Action/Plan:   Will follow for dc needs as pt progresses.  05/08/14 Pt requesting shower chair, however her insurance will not cover per [facility]. Pt states that she will have her son pick one up later.   Anticipated DC Date:  05/08/2014   Anticipated DC Plan:  [place]  CM consult      Choice offered to / List presented to:          [facility] arranged  [employer].   Status of service:  Completed, signed off Medicare Important Message given?  YES (If response is "NO", the following Medicare IM given date fields will be blank) Date Medicare IM given:  05/06/2014 Medicare IM given by:  AMERSON,JULIE Date Additional Medicare IM given:  05/07/2014 Additional Medicare IM given by:  [facility]  Discharge Disposition:  [place]  Per UR Regulation:  Reviewed for med. necessity/level of care/duration of stay  If discussed at [place] of Stay Meetings, dates discussed:    Comments:

## 2014-05-14 ENCOUNTER — Other Ambulatory Visit: Payer: Self-pay | Admitting: Physician Assistant

## 2014-05-14 ENCOUNTER — Inpatient Hospital Stay: Payer: Medicare HMO | Admitting: Physician Assistant

## 2014-05-14 NOTE — Telephone Encounter (Signed)
Medication refilled per protocol. 

## 2014-05-20 ENCOUNTER — Ambulatory Visit: Payer: Medicare Other | Admitting: Physician Assistant

## 2014-05-27 ENCOUNTER — Ambulatory Visit: Payer: Medicare HMO | Admitting: Physician Assistant

## 2014-06-01 ENCOUNTER — Ambulatory Visit (INDEPENDENT_AMBULATORY_CARE_PROVIDER_SITE_OTHER): Payer: Medicare HMO | Admitting: Physician Assistant

## 2014-06-01 ENCOUNTER — Encounter: Payer: Self-pay | Admitting: Physician Assistant

## 2014-06-01 VITALS — BP 140/76 | HR 60 | Temp 98.1°F | Resp 18 | Wt 161.0 lb

## 2014-06-01 DIAGNOSIS — K5792 Diverticulitis of intestine, part unspecified, without perforation or abscess without bleeding: Secondary | ICD-10-CM | POA: Diagnosis not present

## 2014-06-01 DIAGNOSIS — E1165 Type 2 diabetes mellitus with hyperglycemia: Secondary | ICD-10-CM | POA: Diagnosis not present

## 2014-06-01 DIAGNOSIS — IMO0002 Reserved for concepts with insufficient information to code with codable children: Secondary | ICD-10-CM

## 2014-06-01 DIAGNOSIS — Z09 Encounter for follow-up examination after completed treatment for conditions other than malignant neoplasm: Secondary | ICD-10-CM | POA: Diagnosis not present

## 2014-06-01 LAB — BASIC METABOLIC PANEL WITH GFR
BUN: 32 mg/dL — ABNORMAL HIGH (ref 6–23)
CO2: 25 meq/L (ref 19–32)
Calcium: 9.1 mg/dL (ref 8.4–10.5)
Chloride: 102 mEq/L (ref 96–112)
Creat: 1.85 mg/dL — ABNORMAL HIGH (ref 0.50–1.10)
GFR, Est African American: 31 mL/min — ABNORMAL LOW
GFR, Est Non African American: 27 mL/min — ABNORMAL LOW
Glucose, Bld: 184 mg/dL — ABNORMAL HIGH (ref 70–99)
Potassium: 4.2 mEq/L (ref 3.5–5.3)
SODIUM: 139 meq/L (ref 135–145)

## 2014-06-01 NOTE — Progress Notes (Signed)
Patient ID: CAMBRIE HURRELL MRN: [id number], DOB: [birth year], 74 y.o. Date of Encounter: @DATE @  Chief Complaint:  Chief Complaint  Patient presents with  . hospital follow up    not fasting    HPI: 74 y.o. year old female  presents for hospital discharge follow-up.  I reviewed her discharge summary. She was hospitalized 05/03/14 through 05/08/14 with acute diverticulitis. Also had UTI/pyelonephritis. Antibiotics were adjusted to cover both of these conditions. Patient states that her abdominal pain has resolved. She also states that the diarrhea just recently finally resolved as well. She states that she has completed a box of Florastor and is wondering whether she is supposed to continue this. Today we reviewed that she was supposed to have stopped her furosemide and just restarted if needed. Not aware that she was supposed to have stopped it and has continued taking this through this time. However she is no longer having any vomiting or diarrhea at this point. She has no specific complaints or concerns today and says that she is finally feeling a lot better.   Past Medical History  Diagnosis Date  . Diabetes mellitus   . Hypertension   . Hyperlipidemia   . Tricuspid regurgitation     Echo 05/02/06-Nml LV. Mild MR. Mod TR  . Osteoporosis   . Diastolic heart failure   . Vitamin D deficiency   . Osteoporosis   . CKD (chronic kidney disease) stage 3, GFR 30-59 ml/min 12/25/2012     Home Meds: Outpatient Prescriptions Prior to Visit  Medication Sig Dispense Refill  . acetaminophen (TYLENOL) 500 MG tablet Take 500 mg by mouth every 6 (six) hours as needed for pain.    Marland Kitchen amLODipine (NORVASC) 10 MG tablet TAKE ONE TABLET BY MOUTH ONCE DAILY 90 tablet 3  . cholecalciferol (VITAMIN D) 1000 UNITS tablet Take 1 tablet (1,000 Units total) by mouth daily. 30 tablet 0  . cloNIDine (CATAPRES) 0.2 MG tablet Take 1 tablet (0.2 mg total) by mouth 2 (two) times daily. 60 tablet 11  .  furosemide (LASIX) 40 MG tablet TAKE ONE TABLET BY MOUTH ONCE DAILY 90 tablet 0  . insulin aspart (NOVOLOG) 100 UNIT/ML FlexPen Inject 5 Units into the skin daily before supper. Take 15 min before supper or immediately after eating supper 15 mL 11  . LEVEMIR FLEXTOUCH 100 UNIT/ML Pen INJECT 22 UNITS INTO THE SKIN DAILY AT 10 P.M. 15 mL 0  . loperamide (IMODIUM) 2 MG capsule Take 1 capsule (2 mg total) by mouth as needed for diarrhea or loose stools. 15 capsule 0  . RELION SHORT PEN NEEDLES 31G X 8 MM MISC USE ONE PEN NEEDLE ONCE DAILY AS DIRECTED 50 each 11  . simvastatin (ZOCOR) 20 MG tablet TAKE ONE TABLET BY MOUTH ONCE DAILY AT BEDTIME 90 tablet 0  . saccharomyces boulardii (FLORASTOR) 250 MG capsule Take 1 capsule (250 mg total) by mouth 2 (two) times daily. 30 capsule 0   No facility-administered medications prior to visit.    Allergies:  Allergies  Allergen Reactions  . Ace Inhibitors Other (See Comments)    Hyperkalemia  . Actos [Pioglitazone] Swelling  . Aspirin Other (See Comments)    G.I. Upset.   . Metformin And Related Diarrhea    History   Social History  . Marital Status: Widowed    Spouse Name: N/A  . Number of Children: N/A  . Years of Education: N/A   Occupational History  . Not on file.  Social History Main Topics  . Smoking status: Former Smoker    Quit date: 01/19/2009  . Smokeless tobacco: Never Used  . Alcohol Use: No  . Drug Use: No  . Sexual Activity: Not on file   Other Topics Concern  . Not on file   Social History Narrative   Entered 10/2013:   She has never driven.   She lives with her son.    Family History  Problem Relation Age of Onset  . Cancer Father   . Diabetes Father   . Cancer Brother     Brain  . Cancer Sister     abdominal fat     Review of Systems:  See HPI for pertinent ROS. All other ROS negative.    Physical Exam: Blood pressure 140/76, pulse 60, temperature 98.1 F (36.7 C), temperature source Oral, resp.  rate 18, weight 161 lb (73.029 kg)., Body mass index is 28.53 kg/(m^2). General: WNWD WF. Appears in no acute distress. Neck: Supple. No thyromegaly. No lymphadenopathy. Lungs: Clear bilaterally to auscultation without wheezes, rales, or rhonchi. Breathing is unlabored. Heart: RRR with S1 S2. No murmurs, rubs, or gallops. Abdomen: Soft, non-tender, non-distended with normoactive bowel sounds. No hepatomegaly. No rebound/guarding. No obvious abdominal masses. Musculoskeletal:  Strength and tone normal for age. Extremities/Skin: Warm and dry.  No edema. Neuro: Alert and oriented X 3. Moves all extremities spontaneously. Gait is normal. CNII-XII grossly in tact. Psych:  Responds to questions appropriately with a normal affect.     ASSESSMENT AND PLAN:  74 y.o. year old female with  1. Hospital discharge follow-up I reviewed the hospital discharge summary.  2. Acute diverticulitis - BASIC METABOLIC PANEL WITH GFR - Ambulatory referral to Gastroenterology Hospital discharge summary recommended she have follow-up colonoscopy in approximately 6 weeks. Patient states that her gastroenterologist is Dr. Benson Norway. I have placed referral for follow-up with Dr. Benson Norway. Told her to continue Florastor at least until she follows up with Dr. Benson Norway.  3. Diabetes type 2, uncontrolled Today she does bring in her recent blood sugar log. Fasting morning readings all range between 101-117. These are all well controlled and consistent. However the other readings later in the day are much higher. 2 hour after lunch readings include:  258, 301, 218, 191 Bed time readings include:  288, 258, 239, 295, 236, 299, 261.  I told her to schedule a follow-up visit in about 3 weeks once she is more stable and back to her usual baseline so that we can get back on schedule  forroutine office visits.    [address] [place], [place], [facility] 06/01/2014 12:13 PM

## 2014-06-24 ENCOUNTER — Ambulatory Visit: Payer: Medicare HMO | Admitting: Physician Assistant

## 2014-06-25 ENCOUNTER — Encounter: Payer: Self-pay | Admitting: Physician Assistant

## 2014-06-25 ENCOUNTER — Ambulatory Visit (INDEPENDENT_AMBULATORY_CARE_PROVIDER_SITE_OTHER): Payer: Medicare HMO | Admitting: Physician Assistant

## 2014-06-25 VITALS — BP 142/60 | HR 65 | Temp 98.1°F | Resp 19 | Wt 161.0 lb

## 2014-06-25 DIAGNOSIS — Z1212 Encounter for screening for malignant neoplasm of rectum: Secondary | ICD-10-CM

## 2014-06-25 DIAGNOSIS — D638 Anemia in other chronic diseases classified elsewhere: Secondary | ICD-10-CM | POA: Diagnosis not present

## 2014-06-25 DIAGNOSIS — E1165 Type 2 diabetes mellitus with hyperglycemia: Secondary | ICD-10-CM

## 2014-06-25 DIAGNOSIS — N183 Chronic kidney disease, stage 3 unspecified: Secondary | ICD-10-CM

## 2014-06-25 DIAGNOSIS — M810 Age-related osteoporosis without current pathological fracture: Secondary | ICD-10-CM | POA: Diagnosis not present

## 2014-06-25 DIAGNOSIS — Z1211 Encounter for screening for malignant neoplasm of colon: Secondary | ICD-10-CM

## 2014-06-25 DIAGNOSIS — E559 Vitamin D deficiency, unspecified: Secondary | ICD-10-CM

## 2014-06-25 DIAGNOSIS — IMO0002 Reserved for concepts with insufficient information to code with codable children: Secondary | ICD-10-CM

## 2014-06-25 DIAGNOSIS — I272 Pulmonary hypertension, unspecified: Secondary | ICD-10-CM

## 2014-06-25 DIAGNOSIS — I270 Primary pulmonary hypertension: Secondary | ICD-10-CM

## 2014-06-25 DIAGNOSIS — Z1239 Encounter for other screening for malignant neoplasm of breast: Secondary | ICD-10-CM

## 2014-06-25 DIAGNOSIS — I10 Essential (primary) hypertension: Secondary | ICD-10-CM | POA: Diagnosis not present

## 2014-06-25 DIAGNOSIS — E785 Hyperlipidemia, unspecified: Secondary | ICD-10-CM | POA: Diagnosis not present

## 2014-06-25 MED ORDER — INSULIN ASPART 100 UNIT/ML FLEXPEN: 5.0000 [IU] | PEN_INJECTOR | Freq: Two times a day (BID) | SUBCUTANEOUS | Status: DC

## 2014-06-25 NOTE — Progress Notes (Signed)
Patient ID: BRIA VEENEMAN MRN: [id number], DOB: 06/11/1940, 74 y.o. Date of Encounter: @DATE @  Chief Complaint:  Chief Complaint  Patient presents with  . follow up    HPI: 74 y.o. year old white female  presents for f/u OV.   I had been seeing her routinely in the past until March 2012. After that visit March 2012, I did not see her until 06/13/12.  At that time, she told me that even though she had insurance, she did not have the money to pay her co-pay so she had not come in for any office visits during that time.  She says that she only has to pay a $15 co-pay when she comes here. She says that her finances are better now. Says that she has paid off her son's truck and she no longer has that payment going out  every month.  When she came 06/13/12 she reported that she had  been continuing to use Lantus 15 units every morning through that entire period of time. She also reported that she had been seeing [facility] and they were managing her blood pressure and cholesterol during that time.  She was hospitalized 07/19/12 through 07/25/12. She had appendiceal abscess and underwent surgery by Dr. Hulen Skains.   AT OV 02/25/2014: Prior to the visit 02/25/14 Levemir was at 18 units every morning. Visit I had her increase to Levemir 22 units every morning.  She called me the following Monday morning to report that sugar readings on that dose. She did this. His readings looked good. Told her to continue that current dose of Levemir to 22 units. Told her to continue to check blood sugar fasting every morning but also to check some readings 2 hours postprandial. To have schedule follow-up office visit.  She had follow-up office visit 03/12/14. AT OV 03/12/14: She did bring a blood sugar log sheet. All of those readings are documented in the office note 03/12/14. Review of those readings showed that her bedtime readings were the worst and were all in the 300-400 range. At that visit I told her to  continue the Levemir at 22 units. Told her to add short acting insulin at suppertime/dinnertime 5 units.  TODAY--06/25/2014:  Today she reports that she is still doing the Levemir 22 units.  She reports that she is doing the short acting insulin 5 units at suppertime. She did bring in blood sugar log sheet. For the month of May she has checked fasting blood sugar every morning. These are all excellent and very consistent. They all range from 102-117. She has 3 readings before lunch----260, 218, 216 She has about 10 readings for 2 hours after lunch--- 258, 301, 218, 191, 292, 198, 203, 372, 316 2 readings done before dinner are 240 and 260 She has 5 readings done 2 hours after dinner----252, 280, 190, 203, 191 She has checked bedtime readings every night----there are only 4 readings less than 200 for the entire month of May----184, 190, 186, 171 ------------ all other bedtime readings are in the 200s. Only one reading above 300 and that was one reading of 323.  She did not bring a blood pressure log form today but says that she does still check that occasionally but is always getting good readings on that.   Past Medical History  Diagnosis Date  . Diabetes mellitus   . Hypertension   . Hyperlipidemia   . Tricuspid regurgitation     Echo 05/02/06-Nml LV. Mild MR. Mod TR  .  Osteoporosis   . Diastolic heart failure   . Vitamin D deficiency   . Osteoporosis   . CKD (chronic kidney disease) stage 3, GFR 30-59 ml/min 12/25/2012     Home Meds:  Outpatient Prescriptions Prior to Visit  Medication Sig Dispense Refill  . acetaminophen (TYLENOL) 500 MG tablet Take 500 mg by mouth every 6 (six) hours as needed for pain.    Marland Kitchen amLODipine (NORVASC) 10 MG tablet TAKE ONE TABLET BY MOUTH ONCE DAILY 90 tablet 3  . aspirin EC 81 MG tablet Take 81 mg by mouth daily.    . cholecalciferol (VITAMIN D) 1000 UNITS tablet Take 1 tablet (1,000 Units total) by mouth daily. 30 tablet 0  . cloNIDine (CATAPRES) 0.2  MG tablet Take 1 tablet (0.2 mg total) by mouth 2 (two) times daily. 60 tablet 11  . furosemide (LASIX) 40 MG tablet TAKE ONE TABLET BY MOUTH ONCE DAILY 90 tablet 0  . insulin aspart (NOVOLOG) 100 UNIT/ML FlexPen Inject 5 Units into the skin daily before supper. Take 15 min before supper or immediately after eating supper 15 mL 11  . LEVEMIR FLEXTOUCH 100 UNIT/ML Pen INJECT 22 UNITS INTO THE SKIN DAILY AT 10 P.M. 15 mL 0  . RELION SHORT PEN NEEDLES 31G X 8 MM MISC USE ONE PEN NEEDLE ONCE DAILY AS DIRECTED 50 each 11  . simvastatin (ZOCOR) 20 MG tablet TAKE ONE TABLET BY MOUTH ONCE DAILY AT BEDTIME 90 tablet 0  . loperamide (IMODIUM) 2 MG capsule Take 1 capsule (2 mg total) by mouth as needed for diarrhea or loose stools. (Patient not taking: Reported on 06/25/2014) 15 capsule 0  . saccharomyces boulardii (FLORASTOR) 250 MG capsule Take 1 capsule (250 mg total) by mouth 2 (two) times daily. (Patient not taking: Reported on 06/25/2014) 30 capsule 0   No facility-administered medications prior to visit.     Allergies:  Allergies  Allergen Reactions  . Ace Inhibitors Other (See Comments)    Hyperkalemia  . Actos [Pioglitazone] Swelling  . Aspirin Other (See Comments)    G.I. Upset.   . Metformin And Related Diarrhea    History   Social History  . Marital Status: Widowed    Spouse Name: N/A  . Number of Children: N/A  . Years of Education: N/A   Occupational History  . Not on file.   Social History Main Topics  . Smoking status: Former Smoker    Quit date: 01/19/2009  . Smokeless tobacco: Never Used  . Alcohol Use: No  . Drug Use: No  . Sexual Activity: Not on file   Other Topics Concern  . Not on file   Social History Narrative   Entered 10/2013:   She has never driven.   She lives with her son.    Family History  Problem Relation Age of Onset  . Cancer Father   . Diabetes Father   . Cancer Brother     Brain  . Cancer Sister     abdominal fat     Review of  Systems:  See HPI for pertinent ROS. All other ROS negative.    Physical Exam: Blood pressure 142/60, pulse 65, temperature 98.1 F (36.7 C), temperature source Oral, resp. rate 19, weight 161 lb (73.029 kg)., Body mass index is 28.53 kg/(m^2). General: WNWD WF. Appears in no acute distress. Neck: Supple. No thyromegaly. No lymphadenopathy.No carotid bruits. Lungs: Clear bilaterally to auscultation without wheezes, rales, or rhonchi. Breathing is unlabored. Heart: RRR with S1  S2. No murmurs, rubs, or gallops. Abdomen: Soft, non-tender, non-distended with normoactive bowel sounds. No hepatomegaly. No rebound/guarding. No obvious abdominal masses. Musculoskeletal:  Strength and tone normal for age. Extremities/Skin: Warm and dry. No clubbing or cyanosis. No edema. No rashes or suspicious lesions. Neuro: Alert and oriented X 3. Moves all extremities spontaneously. Gait is normal. CNII-XII grossly in tact. Psych:  Responds to questions appropriately with a normal affect. Diabetic foot exam:  Inspection is normal. No open wounds or nonhealing sores. Sensation testing is normal. There are no palpable dorsalis pedis or posterior tibial pulses bilaterally.      ASSESSMENT AND PLAN:  74 y.o. year old female with  1. Diabetes type 2, uncontrolled  Continue Levemir 22 units daily. Continue mealtime insulin 5 units at dinner/supper. Add mealtime insulin 5 units at lunch Call me if starts getting any readings less than 80  - insulin aspart (NOVOLOG) 100 UNIT/ML FlexPen; Inject 5 Units into the skin 2 (two) times daily before lunch and supper. Take 15 min before lunch/supper or immediately after eating lunch/supper  Dispense: 30 mL; Refill: 11    No longer on  ACE inhibitor therapy--secondary to h/o Hyperkalemia with this.  Also on no ARB sec to this. Meds changed during past hospitalization.  We checked lipid panel 12/16/12 started statin therapy. She says that she is taking the simvastatin as  directed. She had f/u FLP/LFT 06/2013---Lipid Panel Excellent, with LDL 67.   She cannot take metformin secondary to past GI adverse effects and also now she has chronic kidney disease which prohibits use of this.   At [place] 12/14 I discussed with her that she needs to have an annual diabetic eye exam.  At [place] 02/11/14-- she says she has this scheduled for Next Tuesday---"My son got that morning off work to take me that day."  However, at [place] 06/25/14--she says he wasnot able to get off work to take her--she did NOT have eye exam.  She is aware this is recommended.  At prior OV  I discussed proper foot care. Discussed the effects of the diabetes has on her blood vessels as bar as blood flow to her feet. Also discussed the effects it has on peripheral nerves. Discussed that she may have decreased sensation and not feel pain and sores as a normal person would. Therefore needs to wear shoes and be very protective of her feet. Also discussed proper care regarding trimming her toenails etc. If she ever does see any small sores developing that she needs to come in for evaluation immediately.  Discussed need for proper exercise. She states she gets outside and walks whenever possible. She definitely does this when the weather is nice and says even if it is somewhat cold she still tries to get out of remain active.  We have discussed carbohydrate diet in the past that have been a long time. I reviewed a low carbohydrate diet handout with her and discussed details of specific examples of things to avoid as well as gave her examples of meals that she can eat.  Glaucoma Screening: At [facility] 10/30/13-- I did referral to ophthalmology for diabetic eye exam and glaucoma screening. At her visit on 02/11/14 she reported that she has appointment with the eye doctor "next Tuesday and so her son is going to take off that morning from work to take her to that appointment" However, at [place] 06/25/14--she says he wasnot able to get off  work to take her--she did NOT have eye exam.  She is aware this is recommended.   2. Hypertension  At prior visit with me I reviewed her last office note by cardiology dated 06/2012. They had performed a renal artery ultrasound which was negative. In their notes they documented that they reviewed the fact that she has labile blood pressure readings.  They also felt the patient had white coat hypertension.   She was on ACE Inhibitor, until hyperkalemia prohibited this.  At [place] 12/2012 I noted and did: Her heart rate is low so I do not think I should add a beta blocker. Bystolic is going to be expensive. i will Increase her clonidine. She will stop using the patch and switch over to pill form. - cloNIDine (CATAPRES) 0.3 MG tablet; Take 1 tablet (0.3 mg total) by mouth 2 (two) times daily.  Dispense: 60 tablet; Refill: 5  3. CKD (chronic kidney disease) stage 3, GFR 30-59 ml/min In past, we have discussed need to keep BP and BS well controlled.   4. Hyperlipidemia On simvastatin 20 mg. Had f/u lab 06/2013--FLP excellent with LDL 67.  Told her to schedule her next OV early morning and COME FASTING.    5. Osteoporosis  See Note Under Preventive Care/DEXA below.   6. Vitamin D deficiency She had been taking 1000 units daily. At this check a level on 12/16/2012 and it was low. Told her to increase to 4000 units daily. At [place] 12/2012 she said that she forgot about making this change but will go home to start taking the 4000 units daily now.  7. Pulmonary hypertension  8. COPD (chronic obstructive pulmonary disease) Quit smoking 4 years ago.  9. H/O Normocytic anemia    11. Screening for colorectal cancer She states that the last colonoscopy was less than 10 years ago. At that time she was told she can repeat 10 years. Not due yet. At [place] 06/25/14 she reports she recently saw her GI. Was told last colonoscopy 6 yrs ago--4 more years until next one due. Was told they would see her in 4  years, or sooner if needed.  12. Immunizations: Pneumonia vaccine: She received the Pneumovax 11/30/09.   Prevnar 13 --given here 12/2012.  Tetanus:  We gave here 12/2012. Zostavax: Told her to contact her insurance company to find out her cost for this. If she wishes to proceed , she can call us and we can send an order in to the pharmacy for her to go there for the injection. Influenza vaccine: Was given here a 12/16/12.    SHE HAD CPE 10/30/2013. THE FOLLOWING IS COPIED FROM THAT NOTE: 1. Visit for preventive health examination  A. Screening Labs: She has had recent CBC, CMET. Her last lipid panel was 07/04/13 which showed triglycerides 70, HDL 58, LDL 67  B. [id number].  C. Screening Mammogram: Last mammogram was 02/25/2013  D. DEXA/BMD: She has been on Fosamax in the past.  Will not do further bone density test as I would not do further treatment regardless of the test results. E. Colorectal Cancer Screening:  Last colonoscopy was by Dr. Benson Norway 08/13/2008 F. Immunizations:  Influenza: 09/2013 Tetanus: 12/2012 Pneumococcal: She received the Pneumovax 11/30/09. Prevnar 13 --given here 12/2012.  Zostavax: she defers.  ROV 3 months, sooner if needed.   [address] [place], [place], [facility] 06/25/2014 12:16 PM

## 2014-07-23 ENCOUNTER — Other Ambulatory Visit: Payer: Self-pay | Admitting: Physician Assistant

## 2014-07-23 NOTE — Telephone Encounter (Signed)
Medication refilled per protocol. 

## 2014-08-11 ENCOUNTER — Other Ambulatory Visit: Payer: Self-pay | Admitting: Physician Assistant

## 2014-08-11 NOTE — Telephone Encounter (Signed)
Medication refilled per protocol. 

## 2014-09-30 ENCOUNTER — Ambulatory Visit: Payer: Medicare HMO | Admitting: Physician Assistant

## 2014-10-05 ENCOUNTER — Ambulatory Visit (INDEPENDENT_AMBULATORY_CARE_PROVIDER_SITE_OTHER): Payer: Medicare HMO | Admitting: Physician Assistant

## 2014-10-05 ENCOUNTER — Encounter: Payer: Self-pay | Admitting: Physician Assistant

## 2014-10-05 VITALS — BP 152/76 | HR 60 | Temp 98.4°F | Resp 18 | Ht 63.0 in | Wt 169.0 lb

## 2014-10-05 DIAGNOSIS — M810 Age-related osteoporosis without current pathological fracture: Secondary | ICD-10-CM | POA: Diagnosis not present

## 2014-10-05 DIAGNOSIS — D638 Anemia in other chronic diseases classified elsewhere: Secondary | ICD-10-CM | POA: Diagnosis not present

## 2014-10-05 DIAGNOSIS — I272 Pulmonary hypertension, unspecified: Secondary | ICD-10-CM

## 2014-10-05 DIAGNOSIS — E785 Hyperlipidemia, unspecified: Secondary | ICD-10-CM

## 2014-10-05 DIAGNOSIS — Z1239 Encounter for other screening for malignant neoplasm of breast: Secondary | ICD-10-CM | POA: Diagnosis not present

## 2014-10-05 DIAGNOSIS — Z1211 Encounter for screening for malignant neoplasm of colon: Secondary | ICD-10-CM

## 2014-10-05 DIAGNOSIS — E559 Vitamin D deficiency, unspecified: Secondary | ICD-10-CM

## 2014-10-05 DIAGNOSIS — I10 Essential (primary) hypertension: Secondary | ICD-10-CM

## 2014-10-05 DIAGNOSIS — I270 Primary pulmonary hypertension: Secondary | ICD-10-CM | POA: Diagnosis not present

## 2014-10-05 DIAGNOSIS — N183 Chronic kidney disease, stage 3 unspecified: Secondary | ICD-10-CM

## 2014-10-05 DIAGNOSIS — Z23 Encounter for immunization: Secondary | ICD-10-CM | POA: Diagnosis not present

## 2014-10-05 DIAGNOSIS — E1165 Type 2 diabetes mellitus with hyperglycemia: Secondary | ICD-10-CM | POA: Diagnosis not present

## 2014-10-05 DIAGNOSIS — IMO0002 Reserved for concepts with insufficient information to code with codable children: Secondary | ICD-10-CM

## 2014-10-05 DIAGNOSIS — Z1212 Encounter for screening for malignant neoplasm of rectum: Secondary | ICD-10-CM | POA: Diagnosis not present

## 2014-10-05 LAB — COMPLETE METABOLIC PANEL WITH GFR
ALBUMIN: 3.8 g/dL (ref 3.6–5.1)
ALK PHOS: 88 U/L (ref 33–130)
ALT: 13 U/L (ref 6–29)
AST: 14 U/L (ref 10–35)
BILIRUBIN TOTAL: 0.4 mg/dL (ref 0.2–1.2)
BUN: 32 mg/dL — ABNORMAL HIGH (ref 7–25)
CO2: 25 mmol/L (ref 20–31)
CREATININE: 1.6 mg/dL — AB (ref 0.60–0.93)
Calcium: 9.2 mg/dL (ref 8.6–10.4)
Chloride: 101 mmol/L (ref 98–110)
GFR, EST AFRICAN AMERICAN: 36 mL/min — AB (ref >=60)
GFR, EST NON AFRICAN AMERICAN: 32 mL/min — AB (ref >=60)
Glucose, Bld: 228 mg/dL — ABNORMAL HIGH (ref 70–99)
Potassium: 4.5 mmol/L (ref 3.5–5.3)
Sodium: 138 mmol/L (ref 135–146)
TOTAL PROTEIN: 7.2 g/dL (ref 6.1–8.1)

## 2014-10-05 NOTE — Progress Notes (Signed)
Patient ID: Laura Best MRN: [id number], DOB: November 11, 1940, 74 y.o. Date of Encounter: @DATE @  Chief Complaint:  Chief Complaint  Patient presents with  . 3 mth check up    not fasting  . Flu Vaccine    HPI: 74 y.o. year old white female  presents for f/u OV.   I had been seeing her routinely in the past until March 2012. After that visit March 2012, I did not see her until 06/13/12.  At that time, she told me that even though she had insurance, she did not have the money to pay her co-pay so she had not come in for any office visits during that time.  She says that she only has to pay a $15 co-pay when she comes here. She says that her finances are better now. Says that she has paid off her son's truck and she no longer has that payment going out  every month.  When she came 06/13/12 she reported that she had  been continuing to use Lantus 15 units every morning through that entire period of time. She also reported that she had been seeing [facility] and they were managing her blood pressure and cholesterol during that time.  She was hospitalized 07/19/12 through 07/25/12. She had appendiceal abscess and underwent surgery by Dr. Hulen Skains.   AT OV 02/25/2014: Prior to the visit 02/25/14 Levemir was at 18 units every morning. Visit I had her increase to Levemir 22 units every morning.  She called me the following Monday morning to report that sugar readings on that dose. She did this. His readings looked good. Told her to continue that current dose of Levemir to 22 units. Told her to continue to check blood sugar fasting every morning but also to check some readings 2 hours postprandial. To have schedule follow-up office visit.  She had follow-up office visit 03/12/14. AT OV 03/12/14: She did bring a blood sugar log sheet. All of those readings are documented in the office note 03/12/14. Review of those readings showed that her bedtime readings were the worst and were all in the 300-400  range. At that visit I told her to continue the Levemir at 22 units. Told her to add short acting insulin at suppertime/dinnertime 5 units.  OV--06/25/2014:  Today she reports that she is still doing the Levemir 22 units.  She reports that she is doing the short acting insulin 5 units at suppertime. She did bring in blood sugar log sheet. For the month of May she has checked fasting blood sugar every morning. These are all excellent and very consistent. They all range from 102-117. She has 3 readings before lunch----260, 218, 216 She has about 10 readings for 2 hours after lunch--- 258, 301, 218, 191, 292, 198, 203, 372, 316 2 readings done before dinner are 240 and 260 She has 5 readings done 2 hours after dinner----252, 280, 190, 203, 191 She has checked bedtime readings every night----there are only 4 readings less than 200 for the entire month of May----184, 190, 186, 171 ------------ all other bedtime readings are in the 200s. Only one reading above 300 and that was one reading of 323. At that OV I told her to: Continue Levemir 22 units daily. Continue mealtime insulin 5 units at dinner/supper. Add mealtime insulin 5 units at lunch Call me if starts getting any readings less than 80  AT OV 10/05/2014: She reports that she is taking all of the insulins as listed above.  She did add the 5 units at lunchtime. He brings in blood sugar log readings with her to visit today. Most every day she has checked her sugar 3 or 4 times per day. Occasionally only 2 readings per day. Every single morning she has a fasting sugar documented. Recent fasting readings have all been very good ranging from 83-112. No low readings. Readings 2 hours after breakfast have ranged from 118-211. Readings before lunch have ranged 129-211 Readings before dinner have mostly been 148 and 158 but occasionally have been higher at 226. Readings 2 hours after dinner--she has a couple of readings around 122. Most readings are  in the 150-160 range. Some are higher in the 200-268 range. Most of the bedtime readings are around 200. ----However really cannot get these bedtime readings much lower without causing hypoglycemia by morning.  She states that she is taking all blood pressure medications as directed with no lightheadedness, no extremity edema. She did not bring a blood pressure log form today but says that she does still check that occasionally but is always getting good readings on that.  She is taking cholesterol medication as directed with no myalgias or other adverse effects.  She states that she has had no other medical problems and has not had to go to the hospital for any medical issues since her last visit with me.     Past Medical History  Diagnosis Date  . Diabetes mellitus   . Hypertension   . Hyperlipidemia   . Tricuspid regurgitation     Echo 05/02/06-Nml LV. Mild MR. Mod TR  . Osteoporosis   . Diastolic heart failure   . Vitamin D deficiency   . Osteoporosis   . CKD (chronic kidney disease) stage 3, GFR 30-59 ml/min 12/25/2012     Home Meds:  Outpatient Prescriptions Prior to Visit  Medication Sig Dispense Refill  . acetaminophen (TYLENOL) 500 MG tablet Take 500 mg by mouth every 6 (six) hours as needed for pain.    Marland Kitchen amLODipine (NORVASC) 10 MG tablet TAKE ONE TABLET BY MOUTH ONCE DAILY 90 tablet 3  . aspirin EC 81 MG tablet Take 81 mg by mouth daily.    . cholecalciferol (VITAMIN D) 1000 UNITS tablet Take 1 tablet (1,000 Units total) by mouth daily. 30 tablet 0  . cloNIDine (CATAPRES) 0.2 MG tablet Take 1 tablet (0.2 mg total) by mouth 2 (two) times daily. 60 tablet 11  . furosemide (LASIX) 40 MG tablet TAKE ONE TABLET BY MOUTH ONCE DAILY 90 tablet 0  . insulin aspart (NOVOLOG) 100 UNIT/ML FlexPen Inject 5 Units into the skin 2 (two) times daily before lunch and supper. Take 15 min before lunch/supper or immediately after eating lunch/supper 30 mL 11  . LEVEMIR FLEXTOUCH 100 UNIT/ML  Pen INJECT 22 UNITS SUBCUTANEOUSLY INTO THE SKIN DAILY AT 10 PM 15 mL 3  . loperamide (IMODIUM) 2 MG capsule Take 1 capsule (2 mg total) by mouth as needed for diarrhea or loose stools. 15 capsule 0  . RELION SHORT PEN NEEDLES 31G X 8 MM MISC USE ONE PEN NEEDLE ONCE DAILY AS DIRECTED 50 each 11  . simvastatin (ZOCOR) 20 MG tablet TAKE ONE TABLET BY MOUTH ONCE DAILY AT BEDTIME 90 tablet 0  . saccharomyces boulardii (FLORASTOR) 250 MG capsule Take 1 capsule (250 mg total) by mouth 2 (two) times daily. (Patient not taking: Reported on 10/05/2014) 30 capsule 0   No facility-administered medications prior to visit.     Allergies:  Allergies  Allergen Reactions  . Ace Inhibitors Other (See Comments)    Hyperkalemia  . Actos [Pioglitazone] Swelling  . Aspirin Other (See Comments)    G.I. Upset.   . Metformin And Related Diarrhea    Social History   Social History  . Marital Status: Widowed    Spouse Name: N/A  . Number of Children: N/A  . Years of Education: N/A   Occupational History  . Not on file.   Social History Main Topics  . Smoking status: Former Smoker    Quit date: 01/19/2009  . Smokeless tobacco: Never Used  . Alcohol Use: No  . Drug Use: No  . Sexual Activity: Not on file   Other Topics Concern  . Not on file   Social History Narrative   Entered 10/2013:   She has never driven.   She lives with her son.    Family History  Problem Relation Age of Onset  . Cancer Father   . Diabetes Father   . Cancer Brother     Brain  . Cancer Sister     abdominal fat     Review of Systems:  See HPI for pertinent ROS. All other ROS negative.    Physical Exam: Blood pressure 152/76, pulse 60, temperature 98.4 F (36.9 C), temperature source Oral, resp. rate 18, height 5\' 3"  (1.6 m), weight 169 lb (76.658 kg)., Body mass index is 29.94 kg/(m^2). General: WNWD WF. Appears in no acute distress. Neck: Supple. No thyromegaly. No lymphadenopathy.No carotid bruits. Lungs:  Clear bilaterally to auscultation without wheezes, rales, or rhonchi. Breathing is unlabored. Heart: RRR with S1 S2. No murmurs, rubs, or gallops. Abdomen: Soft, non-tender, non-distended with normoactive bowel sounds. No hepatomegaly. No rebound/guarding. No obvious abdominal masses. Musculoskeletal:  Strength and tone normal for age. Extremities/Skin: Warm and dry. No clubbing or cyanosis. No edema. No rashes or suspicious lesions. Neuro: Alert and oriented X 3. Moves all extremities spontaneously. Gait is normal. CNII-XII grossly in tact. Psych:  Responds to questions appropriately with a normal affect. Diabetic foot exam:  Inspection is normal. No open wounds or nonhealing sores. Sensation testing is normal. There are no palpable dorsalis pedis or posterior tibial pulses bilaterally.      ASSESSMENT AND PLAN:  74 y.o. year old female with  1. Diabetes type 2, uncontrolled   Continue Levemir 22 units daily. Continue mealtime insulin 5 units at dinner/supper. Continue mealtime insulin 5 units at lunch Call me if starts getting any readings less than 80  --MicroAlbumin---10/05/2014 --HgbA1C  No longer on  ACE inhibitor therapy--secondary to h/o Hyperkalemia with this.  Also on no ARB sec to this. Meds changed during past hospitalization.  We checked lipid panel 12/16/12 started statin therapy. She says that she is taking the simvastatin as directed. She had f/u FLP/LFT 06/2013---Lipid Panel Excellent, with LDL 67.   She cannot take metformin secondary to past GI adverse effects and also now she has chronic kidney disease which prohibits use of this.   At [place] 12/14 I discussed with her that she needs to have an annual diabetic eye exam.  At [place] 02/11/14-- she says she has this scheduled for Next Tuesday---"My son got that morning off work to take me that day."  However, at [place] 06/25/14--she says he wasnot able to get off work to take her--she did NOT have eye exam.  Discussed again at  office visit 10/05/14----she says that she still has not been able to have eye exam. She  is aware this is recommended.  At prior OV  I discussed proper foot care. Discussed the effects of the diabetes has on her blood vessels as bar as blood flow to her feet. Also discussed the effects it has on peripheral nerves. Discussed that she may have decreased sensation and not feel pain and sores as a normal person would. Therefore needs to wear shoes and be very protective of her feet. Also discussed proper care regarding trimming her toenails etc. If she ever does see any small sores developing that she needs to come in for evaluation immediately. Diabetic foot exam--documented in quality metrics 10/05/2014  Discussed need for proper exercise. She states she gets outside and walks whenever possible. She definitely does this when the weather is nice and says even if it is somewhat cold she still tries to get out of remain active.  We have discussed carbohydrate diet in the past that have been a long time. I reviewed a low carbohydrate diet handout with her and discussed details of specific examples of things to avoid as well as gave her examples of meals that she can eat.  Glaucoma Screening: At [facility] 10/30/13-- I did referral to ophthalmology for diabetic eye exam and glaucoma screening. At her visit on 02/11/14 she reported that she has appointment with the eye doctor "next Tuesday and so her son is going to take off that morning from work to take her to that appointment" However, at [place] 06/25/14--she says he wasnot able to get off work to take her--she did NOT have eye exam.  She is aware this is recommended.   2. Hypertension  At prior visit with me I reviewed her last office note by cardiology dated 06/2012. They had performed a renal artery ultrasound which was negative. In their notes they documented that they reviewed the fact that she has labile blood pressure readings.  They also felt the patient had  white coat hypertension.   She was on ACE Inhibitor, until hyperkalemia prohibited this.  At [place] 12/2012 I noted and did: Her heart rate is low so I do not think I should add a beta blocker. Bystolic is going to be expensive. i will Increase her clonidine. She will stop using the patch and switch over to pill form. - cloNIDine (CATAPRES) 0.3 MG tablet; Take 1 tablet (0.3 mg total) by mouth 2 (two) times daily.  Dispense: 60 tablet; Refill: 5  Blood pressure is now stable/controlled. Patient checks it frequently at home and is getting good readings. Continue current medications with no change.  3. CKD (chronic kidney disease) stage 3, GFR 30-59 ml/min In past, we have discussed need to keep BP and BS well controlled.   4. Hyperlipidemia On simvastatin 20 mg. Had f/u lab 06/2013--FLP excellent with LDL 67.  She is not fasting today so will wait to recheck this. Told her to schedule her next OV early morning and COME FASTING.    5. Osteoporosis  She has been on Fosamax in the past.  Will not do further bone density test as I would not do further treatment regardless of the test results.  6. Vitamin D deficiency On 4,000 IU QD  7. Pulmonary hypertension  8. COPD (chronic obstructive pulmonary disease) Quit smoking 4 years ago.  9. H/O Normocytic anemia  11. Screening for colorectal cancer She states that the last colonoscopy was less than 10 years ago. At that time she was told she can repeat 10 years. Not due yet. At [place]  06/25/14 she reports she recently saw her GI. Was told last colonoscopy 6 yrs ago--4 more years until next one due. Was told they would see her in 4 years, or sooner if needed.  12. Immunizations: Pneumonia vaccine: She received the Pneumovax 11/30/09.   Prevnar 13 --given here 12/2012.  Tetanus:  We gave here 12/2012. Zostavax: Told her to contact her insurance company to find out her cost for this. If she wishes to proceed , she can call us  and we can send an order in to the pharmacy for her to go there for the injection. Influenza vaccine: Was given here 10/05/2014    SHE HAD CPE 10/30/2013. THE FOLLOWING IS COPIED FROM THAT NOTE: 1. Visit for preventive health examination  A. Screening Labs: She has had recent CBC, CMET. Her last lipid panel was 07/04/13 which showed triglycerides 70, HDL 58, LDL 67  B. [id number].  C. Screening Mammogram: Last mammogram was 02/25/2013  D. DEXA/BMD: She has been on Fosamax in the past.  Will not do further bone density test as I would not do further treatment regardless of the test results. E. Colorectal Cancer Screening:  Last colonoscopy was by Dr. Benson Norway 08/13/2008 F. Immunizations:  Influenza: 09/2013, 10/05/2014 Tetanus: 12/2012 Pneumococcal: She received the Pneumovax 11/30/09. Prevnar 13 --given here 12/2012.  Zostavax: she defers.  ROV 3 months, sooner if needed.   Signed, [address] Home, [place], [facility] 10/05/2014 10:26 AM

## 2014-10-06 LAB — MICROALBUMIN, URINE: Microalb, Ur: 15 mg/dL — ABNORMAL HIGH (ref <2.0)

## 2014-10-06 LAB — HEMOGLOBIN A1C
Hgb A1c MFr Bld: 7.8 % — ABNORMAL HIGH (ref <5.7)
Mean Plasma Glucose: 177 mg/dL — ABNORMAL HIGH (ref <117)

## 2014-10-09 ENCOUNTER — Other Ambulatory Visit: Payer: Self-pay | Admitting: Family Medicine

## 2014-10-09 DIAGNOSIS — E1165 Type 2 diabetes mellitus with hyperglycemia: Secondary | ICD-10-CM

## 2014-10-09 DIAGNOSIS — N183 Chronic kidney disease, stage 3 unspecified: Secondary | ICD-10-CM

## 2014-10-09 DIAGNOSIS — IMO0002 Reserved for concepts with insufficient information to code with codable children: Secondary | ICD-10-CM

## 2014-10-13 ENCOUNTER — Other Ambulatory Visit: Payer: Self-pay | Admitting: Physician Assistant

## 2014-10-13 NOTE — Telephone Encounter (Signed)
Medication refilled per protocol. 

## 2014-10-26 DIAGNOSIS — E1165 Type 2 diabetes mellitus with hyperglycemia: Secondary | ICD-10-CM | POA: Diagnosis not present

## 2014-11-11 DIAGNOSIS — I10 Essential (primary) hypertension: Secondary | ICD-10-CM | POA: Diagnosis not present

## 2014-11-11 DIAGNOSIS — D649 Anemia, unspecified: Secondary | ICD-10-CM | POA: Diagnosis not present

## 2014-11-11 DIAGNOSIS — I509 Heart failure, unspecified: Secondary | ICD-10-CM | POA: Diagnosis not present

## 2014-11-11 DIAGNOSIS — N184 Chronic kidney disease, stage 4 (severe): Secondary | ICD-10-CM | POA: Diagnosis not present

## 2014-11-20 ENCOUNTER — Other Ambulatory Visit (HOSPITAL_COMMUNITY): Payer: Self-pay | Admitting: Nephrology

## 2014-11-20 DIAGNOSIS — N183 Chronic kidney disease, stage 3 unspecified: Secondary | ICD-10-CM

## 2014-11-20 DIAGNOSIS — I129 Hypertensive chronic kidney disease with stage 1 through stage 4 chronic kidney disease, or unspecified chronic kidney disease: Secondary | ICD-10-CM

## 2014-11-27 ENCOUNTER — Ambulatory Visit (HOSPITAL_COMMUNITY)
Admission: RE | Admit: 2014-11-27 | Discharge: 2014-11-27 | Disposition: A | Discharge status: Home / Self Care | Payer: Medicare HMO | Source: Ambulatory Visit | Attending: Nephrology | Admitting: Nephrology

## 2014-11-27 DIAGNOSIS — D509 Iron deficiency anemia, unspecified: Secondary | ICD-10-CM | POA: Diagnosis not present

## 2014-11-27 DIAGNOSIS — D519 Vitamin B12 deficiency anemia, unspecified: Secondary | ICD-10-CM | POA: Diagnosis not present

## 2014-11-27 DIAGNOSIS — E559 Vitamin D deficiency, unspecified: Secondary | ICD-10-CM | POA: Diagnosis not present

## 2014-11-27 DIAGNOSIS — N183 Chronic kidney disease, stage 3 unspecified: Secondary | ICD-10-CM

## 2014-11-27 DIAGNOSIS — D631 Anemia in chronic kidney disease: Secondary | ICD-10-CM | POA: Diagnosis not present

## 2014-11-27 DIAGNOSIS — E119 Type 2 diabetes mellitus without complications: Secondary | ICD-10-CM | POA: Diagnosis not present

## 2014-11-27 DIAGNOSIS — I10 Essential (primary) hypertension: Secondary | ICD-10-CM | POA: Diagnosis not present

## 2014-11-27 DIAGNOSIS — E1122 Type 2 diabetes mellitus with diabetic chronic kidney disease: Secondary | ICD-10-CM | POA: Diagnosis not present

## 2014-11-27 DIAGNOSIS — I129 Hypertensive chronic kidney disease with stage 1 through stage 4 chronic kidney disease, or unspecified chronic kidney disease: Secondary | ICD-10-CM

## 2014-11-27 DIAGNOSIS — Z79899 Other long term (current) drug therapy: Secondary | ICD-10-CM | POA: Diagnosis not present

## 2014-11-27 DIAGNOSIS — R809 Proteinuria, unspecified: Secondary | ICD-10-CM | POA: Diagnosis not present

## 2014-12-22 DIAGNOSIS — R809 Proteinuria, unspecified: Secondary | ICD-10-CM | POA: Diagnosis not present

## 2014-12-22 DIAGNOSIS — E1129 Type 2 diabetes mellitus with other diabetic kidney complication: Secondary | ICD-10-CM | POA: Diagnosis not present

## 2014-12-22 DIAGNOSIS — I10 Essential (primary) hypertension: Secondary | ICD-10-CM | POA: Diagnosis not present

## 2014-12-22 DIAGNOSIS — N183 Chronic kidney disease, stage 3 (moderate): Secondary | ICD-10-CM | POA: Diagnosis not present

## 2015-01-04 ENCOUNTER — Ambulatory Visit (INDEPENDENT_AMBULATORY_CARE_PROVIDER_SITE_OTHER): Payer: Medicare HMO | Admitting: Physician Assistant

## 2015-01-04 ENCOUNTER — Encounter: Payer: Self-pay | Admitting: Physician Assistant

## 2015-01-04 VITALS — BP 140/70 | HR 60 | Temp 98.3°F | Resp 18 | Wt 167.0 lb

## 2015-01-04 DIAGNOSIS — N183 Chronic kidney disease, stage 3 unspecified: Secondary | ICD-10-CM

## 2015-01-04 DIAGNOSIS — IMO0002 Reserved for concepts with insufficient information to code with codable children: Secondary | ICD-10-CM

## 2015-01-04 DIAGNOSIS — E1165 Type 2 diabetes mellitus with hyperglycemia: Secondary | ICD-10-CM | POA: Diagnosis not present

## 2015-01-04 DIAGNOSIS — E785 Hyperlipidemia, unspecified: Secondary | ICD-10-CM | POA: Diagnosis not present

## 2015-01-04 DIAGNOSIS — I10 Essential (primary) hypertension: Secondary | ICD-10-CM | POA: Diagnosis not present

## 2015-01-04 DIAGNOSIS — E559 Vitamin D deficiency, unspecified: Secondary | ICD-10-CM

## 2015-01-04 DIAGNOSIS — I272 Other secondary pulmonary hypertension: Secondary | ICD-10-CM | POA: Diagnosis not present

## 2015-01-04 DIAGNOSIS — M810 Age-related osteoporosis without current pathological fracture: Secondary | ICD-10-CM

## 2015-01-04 DIAGNOSIS — E1121 Type 2 diabetes mellitus with diabetic nephropathy: Secondary | ICD-10-CM

## 2015-01-04 DIAGNOSIS — Z794 Long term (current) use of insulin: Secondary | ICD-10-CM | POA: Diagnosis not present

## 2015-01-04 LAB — COMPLETE METABOLIC PANEL WITH GFR
ALBUMIN: 3.8 g/dL (ref 3.6–5.1)
ALK PHOS: 103 U/L (ref 33–130)
ALT: 11 U/L (ref 6–29)
AST: 13 U/L (ref 10–35)
BILIRUBIN TOTAL: 0.4 mg/dL (ref 0.2–1.2)
BUN: 31 mg/dL — AB (ref 7–25)
CO2: 27 mmol/L (ref 20–31)
Calcium: 9.4 mg/dL (ref 8.6–10.4)
Chloride: 102 mmol/L (ref 98–110)
Creat: 1.57 mg/dL — ABNORMAL HIGH (ref 0.60–0.93)
GFR, EST NON AFRICAN AMERICAN: 32 mL/min — AB (ref >=60)
GFR, Est African American: 37 mL/min — ABNORMAL LOW (ref >=60)
GLUCOSE: 182 mg/dL — AB (ref 70–99)
POTASSIUM: 4.3 mmol/L (ref 3.5–5.3)
SODIUM: 140 mmol/L (ref 135–146)
TOTAL PROTEIN: 7.2 g/dL (ref 6.1–8.1)

## 2015-01-04 LAB — LIPID PANEL
CHOL/HDL RATIO: 2.9 ratio (ref <=5.0)
Cholesterol: 157 mg/dL (ref 125–200)
HDL: 55 mg/dL (ref >=46)
LDL CALC: 77 mg/dL (ref <130)
Triglycerides: 124 mg/dL (ref <150)
VLDL: 25 mg/dL (ref <30)

## 2015-01-04 LAB — HEMOGLOBIN A1C
HEMOGLOBIN A1C: 8.7 % — AB (ref <5.7)
MEAN PLASMA GLUCOSE: 203 mg/dL — AB (ref <117)

## 2015-01-04 MED ORDER — INSULIN ASPART PROT & ASPART (70-30 MIX) 100 UNIT/ML PEN: PEN_INJECTOR | SUBCUTANEOUS | Status: DC

## 2015-01-04 MED ORDER — INSULIN ASPART 100 UNIT/ML ~~LOC~~ SOLN: SUBCUTANEOUS | Status: DC

## 2015-01-04 MED ORDER — INSULIN ASPART 100 UNIT/ML FLEXPEN: PEN_INJECTOR | SUBCUTANEOUS | Status: DC

## 2015-01-04 NOTE — Progress Notes (Signed)
Patient ID: Laura Best MRN: [id number], DOB: 28-Apr-1940, 74 y.o. Date of Encounter: @DATE @  Chief Complaint:  Chief Complaint  Patient presents with  . 3 mth check up    is fasting    HPI: 74 y.o. year old white female  presents for f/u OV.   I had been seeing her routinely in the past until March 2012. After that visit March 2012, I did not see her until 06/13/12.  At that time, she told me that even though she had insurance, she did not have the money to pay her co-pay so she had not come in for any office visits during that time.  She says that she only has to pay a $15 co-pay when she comes here. She says that her finances are better now. Says that she has paid off her son's truck and she no longer has that payment going out  every month.  When she came 06/13/12 she reported that she had  been continuing to use Lantus 15 units every morning through that entire period of time. She also reported that she had been seeing [facility] and they were managing her blood pressure and cholesterol during that time.  She was hospitalized 07/19/12 through 07/25/12. She had appendiceal abscess and underwent surgery by Dr. Hulen Skains.   AT OV 02/25/2014: Prior to the visit 02/25/14 Levemir was at 18 units every morning. At that visit I had her increase to Levemir 22 units every morning.  She called me the following Monday morning to report that sugar readings on that dose. She did this. Her readings looked good. Told her to continue that current dose of Levemir to 22 units. Told her to continue to check blood sugar fasting every morning but also to check some readings 2 hours postprandial. To have schedule follow-up office visit.  She had follow-up office visit 03/12/14. AT OV 03/12/14: She did bring a blood sugar log sheet. All of those readings are documented in the office note 03/12/14. Review of those readings showed that her bedtime readings were the worst and were all in the 300-400 range. At  that visit I told her to continue the Levemir at 22 units. Told her to add short acting insulin at suppertime/dinnertime 5 units.  OV--06/25/2014:  Today she reports that she is still doing the Levemir 22 units.  She reports that she is doing the short acting insulin 5 units at suppertime. She did bring in blood sugar log sheet. For the month of May she has checked fasting blood sugar every morning. These are all excellent and very consistent. They all range from 102-117. She has 3 readings before lunch----260, 218, 216 She has about 10 readings for 2 hours after lunch--- 258, 301, 218, 191, 292, 198, 203, 372, 316 2 readings done before dinner are 240 and 260 She has 5 readings done 2 hours after dinner----252, 280, 190, 203, 191 She has checked bedtime readings every night----there are only 4 readings less than 200 for the entire month of May----184, 190, 186, 171 ------------ all other bedtime readings are in the 200s. Only one reading above 300 and that was one reading of 323. At that OV I told her to: Continue Levemir 22 units daily. Continue mealtime insulin 5 units at dinner/supper. Add mealtime insulin 5 units at lunch Call me if starts getting any readings less than 80  AT OV 10/05/2014: She reports that she is taking all of the insulins as listed above. She did  add the 5 units at lunchtime. She brings in blood sugar log readings with her to visit today. Most every day she has checked her sugar 3 or 4 times per day. Occasionally only 2 readings per day. Every single morning she has a fasting sugar documented. Recent fasting readings have all been very good ranging from 83-112. No low readings. Readings 2 hours after breakfast have ranged from 118-211. Readings before lunch have ranged 129-211 Readings before dinner have mostly been 148 and 158 but occasionally have been higher at 226. Readings 2 hours after dinner--she has a couple of readings around 122. Most readings are in the  150-160 range. Some are higher in the 200-268 range. Most of the bedtime readings are around 200. ----However really cannot get these bedtime readings much lower without causing hypoglycemia by morning.  At visit 10/05/14 I checked a microalbumin which came back elevated at 15.0. Also CMET showed GFR 32 with BUN/creatinine of 32 and 1.60. At that time, I ordered referral to nephrology.  Today patient states that she did see Dr. Hinda Lenis,  Nephrologist. She says that she saw him November 4 and he did lab, urinalysis and an ultrasound. Says that she had follow-up with him November 29 and that the only medicine he has changed with her vitamin D. Says that there is been no other changes in medications. Says that at that visit she was told to follow-up 4 months.  At [place] 01/04/2015: She does bring blood sugar log sheet. Fasting readings: are mostly 105-117. There is a reading at 91 and a reading of 137. 2 hours after lunch: those readings range 209-264 Before dinner: For the month of December,  she just has a few readings and these are 175, 219, 158 Bedtime readings are 211, 238, 262, 161  She states that she is taking all blood pressure medications as directed with no lightheadedness, no extremity edema. She did not bring a blood pressure log form today but says that she does still check that occasionally but is always getting good readings on that.  She is taking cholesterol medication as directed with no myalgias or other adverse effects.  She has no complaints or concerns today. Has been feeling well.   Past Medical History  Diagnosis Date  . Diabetes mellitus   . Hypertension   . Hyperlipidemia   . Tricuspid regurgitation     Echo 05/02/06-Nml LV. Mild MR. Mod TR  . Osteoporosis   . Diastolic heart failure ([place])   . Vitamin D deficiency   . Osteoporosis   . CKD (chronic kidney disease) stage 3, GFR 30-59 ml/min 12/25/2012     Home Meds:  Outpatient Prescriptions Prior to Visit    Medication Sig Dispense Refill  . acetaminophen (TYLENOL) 500 MG tablet Take 500 mg by mouth every 6 (six) hours as needed for pain.    Marland Kitchen amLODipine (NORVASC) 10 MG tablet TAKE ONE TABLET BY MOUTH ONCE DAILY 90 tablet 3  . aspirin EC 81 MG tablet Take 81 mg by mouth daily.    . cloNIDine (CATAPRES) 0.2 MG tablet Take 1 tablet (0.2 mg total) by mouth 2 (two) times daily. 60 tablet 11  . furosemide (LASIX) 40 MG tablet TAKE ONE TABLET BY MOUTH ONCE DAILY 90 tablet 1  . insulin aspart (NOVOLOG) 100 UNIT/ML FlexPen Inject 5 Units into the skin 2 (two) times daily before lunch and supper. Take 15 min before lunch/supper or immediately after eating lunch/supper 30 mL 11  . LEVEMIR FLEXTOUCH  100 UNIT/ML Pen INJECT 22 UNITS SUBCUTANEOUSLY INTO THE SKIN DAILY AT 10 PM 15 mL 3  . loperamide (IMODIUM) 2 MG capsule Take 1 capsule (2 mg total) by mouth as needed for diarrhea or loose stools. 15 capsule 0  . RELION SHORT PEN NEEDLES 31G X 8 MM MISC USE ONE PEN NEEDLE ONCE DAILY AS DIRECTED 50 each 11  . simvastatin (ZOCOR) 20 MG tablet TAKE ONE TABLET BY MOUTH ONCE DAILY AT BEDTIME 90 tablet 1  . cholecalciferol (VITAMIN D) 1000 UNITS tablet Take 1 tablet (1,000 Units total) by mouth daily. 30 tablet 0  . saccharomyces boulardii (FLORASTOR) 250 MG capsule Take 1 capsule (250 mg total) by mouth 2 (two) times daily. (Patient not taking: Reported on 10/05/2014) 30 capsule 0   No facility-administered medications prior to visit.     Allergies:  Allergies  Allergen Reactions  . Ace Inhibitors Other (See Comments)    Hyperkalemia  . Actos [Pioglitazone] Swelling  . Aspirin Other (See Comments)    G.I. Upset.   . Metformin And Related Diarrhea    Social History   Social History  . Marital Status: Widowed    Spouse Name: N/A  . Number of Children: N/A  . Years of Education: N/A   Occupational History  . Not on file.   Social History Main Topics  . Smoking status: Former Smoker    Quit date:  01/19/2009  . Smokeless tobacco: Never Used  . Alcohol Use: No  . Drug Use: No  . Sexual Activity: Not on file   Other Topics Concern  . Not on file   Social History Narrative   Entered 10/2013:   She has never driven.   She lives with her son.    Family History  Problem Relation Age of Onset  . Cancer Father   . Diabetes Father   . Cancer Brother     Brain  . Cancer Sister     abdominal fat     Review of Systems:  See HPI for pertinent ROS. All other ROS negative.    Physical Exam: Blood pressure 140/70, pulse 60, temperature 98.3 F (36.8 C), temperature source Oral, resp. rate 18, weight 167 lb (75.751 kg)., Body mass index is 29.59 kg/(m^2). General: WNWD WF. Appears in no acute distress. Neck: Supple. No thyromegaly. No lymphadenopathy.No carotid bruits. Lungs: Clear bilaterally to auscultation without wheezes, rales, or rhonchi. Breathing is unlabored. Heart: RRR with S1 S2. No murmurs, rubs, or gallops. Abdomen: Soft, non-tender, non-distended with normoactive bowel sounds. No hepatomegaly. No rebound/guarding. No obvious abdominal masses. Musculoskeletal:  Strength and tone normal for age. Extremities/Skin: Warm and dry. No clubbing or cyanosis. No edema. No rashes or suspicious lesions. Neuro: Alert and oriented X 3. Moves all extremities spontaneously. Gait is normal. CNII-XII grossly in tact. Psych:  Responds to questions appropriately with a normal affect. Diabetic foot exam:  Inspection is normal. No open wounds or nonhealing sores. Sensation testing is normal. There are no palpable dorsalis pedis or posterior tibial pulses bilaterally.      ASSESSMENT AND PLAN:  74 y.o. year old female with  1. Diabetes type 2, uncontrolled   Continue Levemir 22 units daily. AT OV 01/04/2015: Increase mealtime insulin from 5 units to 7 units at lunch AT OV 01/04/2015: Increase mealtime insulin from 5 units to 7 units at dinner/supper. Call me if starts getting any  readings less than 80  --MicroAlbumin---10/05/2014 --HgbA1C  No longer on  ACE inhibitor therapy--secondary to  h/o Hyperkalemia with this.  Also on no ARB sec to this. Meds changed during past hospitalization.  We checked lipid panel 12/16/12 started statin therapy. She says that she is taking the simvastatin as directed. She had f/u FLP/LFT 06/2013---Lipid Panel Excellent, with LDL 67.   She cannot take metformin secondary to past GI adverse effects and also now she has chronic kidney disease which prohibits use of this.   At [place] 12/14 I discussed with her that she needs to have an annual diabetic eye exam.  At [place] 02/11/14-- she says she has this scheduled for Next Tuesday---"My son got that morning off work to take me that day."  However, at [place] 06/25/14--she says he wasnot able to get off work to take her--she did NOT have eye exam.  Discussed again at office visit 10/05/14----she says that she still has not been able to have eye exam. She is aware this is recommended.  At prior OV  I discussed proper foot care. Discussed the effects of the diabetes has on her blood vessels as bar as blood flow to her feet. Also discussed the effects it has on peripheral nerves. Discussed that she may have decreased sensation and not feel pain and sores as a normal person would. Therefore needs to wear shoes and be very protective of her feet. Also discussed proper care regarding trimming her toenails etc. If she ever does see any small sores developing that she needs to come in for evaluation immediately. Diabetic foot exam--documented in quality metrics 10/05/2014  Discussed need for proper exercise. She states she gets outside and walks whenever possible. She definitely does this when the weather is nice and says even if it is somewhat cold she still tries to get out of remain active.  We have discussed carbohydrate diet in the past that have been a long time. I reviewed a low carbohydrate diet handout with her  and discussed details of specific examples of things to avoid as well as gave her examples of meals that she can eat.  Glaucoma Screening: At [facility] 10/30/13-- I did referral to ophthalmology for diabetic eye exam and glaucoma screening. At her visit on 02/11/14 she reported that she has appointment with the eye doctor "next Tuesday and so her son is going to take off that morning from work to take her to that appointment" However, at [place] 06/25/14--she says he wasnot able to get off work to take her--she did NOT have eye exam.  She is aware this is recommended.   2. Hypertension  At prior visit with me I reviewed her last office note by cardiology dated 06/2012. They had performed a renal artery ultrasound which was negative. In their notes they documented that they reviewed the fact that she has labile blood pressure readings.  They also felt the patient had white coat hypertension.   She was on ACE Inhibitor, until hyperkalemia prohibited this.  At [place] 12/2012 I noted and did: Her heart rate is low so I do not think I should add a beta blocker. Bystolic is going to be expensive. i will Increase her clonidine. She will stop using the patch and switch over to pill form. - cloNIDine (CATAPRES) 0.3 MG tablet; Take 1 tablet (0.3 mg total) by mouth 2 (two) times daily.  Dispense: 60 tablet; Refill: 5  Blood pressure is now stable/controlled. Patient checks it frequently at home and is getting good readings. Continue current medications with no change.  3. CKD (chronic kidney disease) stage  3, GFR 30-59 ml/min In past, we have discussed need to keep BP and BS well controlled.  She is now seeing Dr. Hinda Lenis, Renal.  Her first appointment with him was November 4 and she have follow-up there November 29 and says that that time she was told to follow-up there 4 months.  4. Hyperlipidemia On simvastatin 20 mg. Had f/u lab 06/2013--FLP excellent with LDL 67.  She is fasting today so will recheck this.     5. Osteoporosis  She has been on Fosamax in the past.  Will not do further bone density test as I would not do further treatment regardless of the test results.  6. Vitamin D deficiency Was on 4,000 IU QD---At [place] 12/2014--she says that Dr. Hinda Lenis adjusted Vit D dose.  7. Pulmonary hypertension Managed by Cardiology  8. COPD (chronic obstructive pulmonary disease) Quit smoking 4 years ago.  9. H/O Normocytic anemia  11. Screening for colorectal cancer She states that the last colonoscopy was less than 10 years ago. At that time she was told she can repeat 10 years. Not due yet. At [place] 06/25/14 she reports she recently saw her GI. Was told last colonoscopy 6 yrs ago--4 more years until next one due. Was told they would see her in 4 years, or sooner if needed.  12. Immunizations: Pneumonia vaccine: She received the Pneumovax 11/30/09.   Prevnar 13 --given here 12/2012.  Tetanus:  We gave here 12/2012. Zostavax: Told her to contact her insurance company to find out her cost for this. If she wishes to proceed , she can call us and we can send an order in to the pharmacy for her to go there for the injection. Influenza vaccine: Was given here 10/05/2014    SHE HAD CPE 10/30/2013. THE FOLLOWING IS COPIED FROM THAT NOTE: 1. Visit for preventive health examination  A. Screening Labs: She has had recent CBC, CMET. Her last lipid panel was 07/04/13 which showed triglycerides 70, HDL 58, LDL 67  B. [id number].  C. Screening Mammogram: Last mammogram was 02/25/2013  D. DEXA/BMD: She has been on Fosamax in the past.  Will not do further bone density test as I would not do further treatment regardless of the test results. E. Colorectal Cancer Screening:  Last colonoscopy was by Dr. Benson Norway 08/13/2008 F. Immunizations:  Influenza: 09/2013, 10/05/2014 Tetanus: 12/2012 Pneumococcal: She  received the Pneumovax 11/30/09. Prevnar 13 --given here 12/2012.  Zostavax: she defers.  ROV 3 months, sooner if needed.   Signed, [address] [place], [place], [facility] 01/04/2015 10:34 AM

## 2015-01-12 ENCOUNTER — Other Ambulatory Visit: Payer: Self-pay | Admitting: Cardiology

## 2015-01-13 ENCOUNTER — Ambulatory Visit (INDEPENDENT_AMBULATORY_CARE_PROVIDER_SITE_OTHER): Payer: Medicare HMO | Admitting: Cardiology

## 2015-01-13 ENCOUNTER — Encounter: Payer: Self-pay | Admitting: Cardiology

## 2015-01-13 VITALS — BP 152/72 | HR 77 | Ht 63.0 in | Wt 169.0 lb

## 2015-01-13 DIAGNOSIS — E785 Hyperlipidemia, unspecified: Secondary | ICD-10-CM

## 2015-01-13 DIAGNOSIS — N183 Chronic kidney disease, stage 3 unspecified: Secondary | ICD-10-CM

## 2015-01-13 DIAGNOSIS — I10 Essential (primary) hypertension: Secondary | ICD-10-CM

## 2015-01-13 DIAGNOSIS — I509 Heart failure, unspecified: Secondary | ICD-10-CM

## 2015-01-13 DIAGNOSIS — I5189 Other ill-defined heart diseases: Secondary | ICD-10-CM

## 2015-01-13 DIAGNOSIS — I519 Heart disease, unspecified: Secondary | ICD-10-CM | POA: Diagnosis not present

## 2015-01-13 NOTE — Assessment & Plan Note (Signed)
Followed by PCP

## 2015-01-13 NOTE — Assessment & Plan Note (Signed)
Followed by PCP- GFR 30

## 2015-01-13 NOTE — Assessment & Plan Note (Signed)
Grade 2 by echo Dec 2014

## 2015-01-13 NOTE — Progress Notes (Signed)
01/13/2015 Laura Best   07-18-1940  [id number]  Primary Physician [name], PA-C Primary Cardiologist: Jory Sims NP  HPI:  Pleasant 74 y/o female seen today for routine check up. She had been seen by [place] in the past for uncontrolled HTN and diastolic CHF. A Myoview in 2013 was low risk, an echo in Dec 2014 showed preserved LVF with grade 2 diastolic dysfunction. She had some issues with tolerating certain medications, and she had appendicitis with abscess in Dec 2014, ultimately  requiring surgery Sept 2015. She did have an admission for "diverticulitis" in April 2016 but has done well since. Her B/P is controlled on her current medications. She denies any unusual dyspnea or chest pain.    Current Outpatient Prescriptions  Medication Sig Dispense Refill  . acetaminophen (TYLENOL) 500 MG tablet Take 500 mg by mouth every 6 (six) hours as needed for pain.    Marland Kitchen amLODipine (NORVASC) 10 MG tablet TAKE ONE TABLET BY MOUTH ONCE DAILY 90 tablet 1  . aspirin EC 81 MG tablet Take 81 mg by mouth daily.    . Cholecalciferol (VITAMIN D) 2000 UNITS CAPS Take 1 capsule by mouth daily.    . cloNIDine (CATAPRES) 0.2 MG tablet Take 1 tablet (0.2 mg total) by mouth 2 (two) times daily. 60 tablet 11  . furosemide (LASIX) 40 MG tablet TAKE ONE TABLET BY MOUTH ONCE DAILY 90 tablet 1  . insulin aspart (NOVOLOG) 100 UNIT/ML FlexPen Inject 7 units 2 times daily---at lunch time and dinner/supper time.  Inject 15 minutes prior to meal or immediately after meal. 15 mL 11  . LEVEMIR FLEXTOUCH 100 UNIT/ML Pen INJECT 22 UNITS SUBCUTANEOUSLY INTO THE SKIN DAILY AT 10 PM 15 mL 3  . RELION SHORT PEN NEEDLES 31G X 8 MM MISC USE ONE PEN NEEDLE ONCE DAILY AS DIRECTED 50 each 11  . simvastatin (ZOCOR) 20 MG tablet TAKE ONE TABLET BY MOUTH ONCE DAILY AT BEDTIME 90 tablet 1   No current facility-administered medications for this visit.    Allergies  Allergen Reactions  . Ace Inhibitors Other (See Comments)      Hyperkalemia  . Actos [Pioglitazone] Swelling  . Aspirin Other (See Comments)    G.I. Upset.   . Metformin And Related Diarrhea    Social History   Social History  . Marital Status: Widowed    Spouse Name: N/A  . Number of Children: N/A  . Years of Education: N/A   Occupational History  . Not on file.   Social History Main Topics  . Smoking status: Former Smoker    Quit date: 01/19/2009  . Smokeless tobacco: Never Used  . Alcohol Use: No  . Drug Use: No  . Sexual Activity: Not on file   Other Topics Concern  . Not on file   Social History Narrative   Entered 10/2013:   She has never driven.   She lives with her son.     Review of Systems: General: negative for chills, fever, night sweats or weight changes.  Cardiovascular: negative for chest pain, dyspnea on exertion, edema, orthopnea, palpitations, paroxysmal nocturnal dyspnea or shortness of breath Dermatological: negative for rash Respiratory: negative for cough or wheezing Urologic: negative for hematuria Abdominal: negative for nausea, vomiting, diarrhea, bright red blood per rectum, melena, or hematemesis Neurologic: negative for visual changes, syncope, or dizziness All other systems reviewed and are otherwise negative except as noted above.    Blood pressure 152/72, pulse 77, height 5\' 3"  (  1.6 m), weight 169 lb (76.658 kg), SpO2 95 %.  General appearance: alert, cooperative, no distress and poor dentition Neck: no carotid bruit and no JVD Lungs: clear to auscultation bilaterally Heart: regular rate and rhythm Extremities: no edema Skin: Skin color, texture, turgor normal. No rashes or lesions Neurologic: Grossly normal  EKG NSR, poor anterior RW  ASSESSMENT AND PLAN:   Essential hypertension Controlled  Diastolic dysfunction Grade 2 by echo Dec 2014  CKD (chronic kidney disease) stage 3, GFR 30-59 ml/min Followed by PCP- GFR 30  Type 2 diabetes mellitus with renal manifestations  ([place]) Followed by PCP  Hyperlipidemia Controlled- LDL 77 Dec 2016   PLAN  Same Rx- f/u 6 months.   Kerin Ransom K PA-C 01/13/2015 3:01 PM

## 2015-01-13 NOTE — Patient Instructions (Signed)
Medication Instructions:  Your physician recommends that you continue on your current medications as directed. Please refer to the Current Medication list given to you today.   Labwork: NONE  Testing/Procedures: NONE  Follow-Up: Your physician wants you to follow-up in: [place], NP. You will receive a reminder letter in the mail two months in advance. If you don't receive a letter, please call our office to schedule the follow-up appointment.   Any Other Special Instructions Will Be Listed Below (If Applicable).     If you need a refill on your cardiac medications before your next appointment, please call your pharmacy.

## 2015-01-13 NOTE — Assessment & Plan Note (Signed)
Controlled.  

## 2015-01-13 NOTE — Assessment & Plan Note (Signed)
Controlled- LDL 77 Dec 2016

## 2015-01-26 DIAGNOSIS — E1165 Type 2 diabetes mellitus with hyperglycemia: Secondary | ICD-10-CM | POA: Diagnosis not present

## 2015-01-29 ENCOUNTER — Encounter: Payer: Self-pay | Admitting: Family Medicine

## 2015-01-29 ENCOUNTER — Ambulatory Visit (INDEPENDENT_AMBULATORY_CARE_PROVIDER_SITE_OTHER): Payer: Medicare HMO | Admitting: Family Medicine

## 2015-01-29 VITALS — BP 170/70 | HR 58 | Temp 97.8°F | Resp 16 | Wt 168.0 lb

## 2015-01-29 DIAGNOSIS — K5732 Diverticulitis of large intestine without perforation or abscess without bleeding: Secondary | ICD-10-CM | POA: Diagnosis not present

## 2015-01-29 MED ORDER — METRONIDAZOLE 500 MG PO TABS: 500.0000 mg | ORAL_TABLET | Freq: Three times a day (TID) | ORAL | Status: DC

## 2015-01-29 MED ORDER — CIPROFLOXACIN HCL 500 MG PO TABS: 500.0000 mg | ORAL_TABLET | Freq: Two times a day (BID) | ORAL | Status: DC

## 2015-01-29 NOTE — Progress Notes (Signed)
Subjective:    Patient ID: Laura Best, female    DOB: Oct 11, 1940, 75 y.o.   MRN: [id number]  HPI   patient reports 3 days of left lower quadrant abdominal pain. She is tender to palpation in the left lower quadrant. She also reports nausea and one episode of vomiting. She reports subjective fevers. In April 2016 she was admitted with diverticulitis. She states that the pain feels similar to what she experienced then.   She denies diarrhea. She denies constipation. She denies any blood in her stool. She denies hematuria or dysuria. Past Medical History  Diagnosis Date  . Diabetes mellitus   . Hypertension   . Hyperlipidemia   . Tricuspid regurgitation     Echo 05/02/06-Nml LV. Mild MR. Mod TR  . Osteoporosis   . Diastolic heart failure ([place])   . Vitamin D deficiency   . Osteoporosis   . CKD (chronic kidney disease) stage 3, GFR 30-59 ml/min 12/25/2012   Past Surgical History  Procedure Laterality Date  . Heart chamber revision      patched hole --1993  . Tubal ligation    . Laparoscopic appendectomy N/A 09/27/2013    Procedure: APPENDECTOMY - CONVERTED FROM LAPAROSCOPIC;  Surgeon: Donnie Mesa, MD;  Location: [facility];  Service: General;  Laterality: N/A;   Current Outpatient Prescriptions on File Prior to Visit  Medication Sig Dispense Refill  . acetaminophen (TYLENOL) 500 MG tablet Take 500 mg by mouth every 6 (six) hours as needed for pain.    Marland Kitchen amLODipine (NORVASC) 10 MG tablet TAKE ONE TABLET BY MOUTH ONCE DAILY 90 tablet 1  . aspirin EC 81 MG tablet Take 81 mg by mouth daily.    . Cholecalciferol (VITAMIN D) 2000 UNITS CAPS Take 1 capsule by mouth daily.    . cloNIDine (CATAPRES) 0.2 MG tablet Take 1 tablet (0.2 mg total) by mouth 2 (two) times daily. 60 tablet 11  . furosemide (LASIX) 40 MG tablet TAKE ONE TABLET BY MOUTH ONCE DAILY 90 tablet 1  . insulin aspart (NOVOLOG) 100 UNIT/ML FlexPen Inject 7 units 2 times daily---at lunch time and dinner/supper time.  Inject 15  minutes prior to meal or immediately after meal. (Patient taking differently: Inject 7 units 2 times daily---at lunch time and dinner/supper time.) 15 mL 11  . LEVEMIR FLEXTOUCH 100 UNIT/ML Pen INJECT 22 UNITS SUBCUTANEOUSLY INTO THE SKIN DAILY AT 10 PM 15 mL 3  . RELION SHORT PEN NEEDLES 31G X 8 MM MISC USE ONE PEN NEEDLE ONCE DAILY AS DIRECTED 50 each 11  . simvastatin (ZOCOR) 20 MG tablet TAKE ONE TABLET BY MOUTH ONCE DAILY AT BEDTIME 90 tablet 1   No current facility-administered medications on file prior to visit.   Allergies  Allergen Reactions  . Ace Inhibitors Other (See Comments)    Hyperkalemia  . Actos [Pioglitazone] Swelling  . Aspirin Other (See Comments)    G.I. Upset.   . Metformin And Related Diarrhea   Social History   Social History  . Marital Status: Widowed    Spouse Name: N/A  . Number of Children: N/A  . Years of Education: N/A   Occupational History  . Not on file.   Social History Main Topics  . Smoking status: Former Smoker    Quit date: 01/19/2009  . Smokeless tobacco: Never Used  . Alcohol Use: No  . Drug Use: No  . Sexual Activity: Not on file   Other Topics Concern  . Not on file  Social History Narrative   Entered 10/2013:   She has never driven.   She lives with her son.     Review of Systems  All other systems reviewed and are negative.      Objective:   Physical Exam  Constitutional: She appears well-developed and well-nourished.  Cardiovascular: Normal rate, regular rhythm and normal heart sounds.   Pulmonary/Chest: Effort normal and breath sounds normal. No respiratory distress. She has no wheezes. She has no rales.  Abdominal: Soft. She exhibits no distension. There is tenderness. There is no rebound and no guarding.  Musculoskeletal: She exhibits no edema.  Lymphadenopathy:    She has no cervical adenopathy.  Vitals reviewed.         Assessment & Plan:  Diverticulitis of colon - Plan: ciprofloxacin (CIPRO) 500 MG  tablet, metroNIDAZOLE (FLAGYL) 500 MG tablet   patient appears to be developing diverticulitis. Begin Cipro 500 mg by mouth twice a day for 10 days and Flagyl 500 mg by mouth 3 times a day for 10 days. Begin MiraLAX to help avoid constipation. Recheck first of next week or immediately if worse.

## 2015-02-01 ENCOUNTER — Telehealth: Payer: Self-pay | Admitting: *Deleted

## 2015-02-01 NOTE — Telephone Encounter (Signed)
Pt called back states she is feeling better and also aware of change in the medication

## 2015-02-01 NOTE — Telephone Encounter (Signed)
Received call from pt stating that she did not have to go to hospital over the weekend also states that she is taking metronidazole TID and states is making her sick on her stomach and wants to know what you wanted to do if you were going to change her medication or can she decrease her dose? Please Advise!  [facility] [place]

## 2015-02-01 NOTE — Telephone Encounter (Signed)
Tried calling pt no answer

## 2015-02-01 NOTE — Telephone Encounter (Signed)
Decrease to bid for 10 days total.  Is she feeling better?

## 2015-02-05 ENCOUNTER — Telehealth: Payer: Self-pay | Admitting: Physician Assistant

## 2015-02-05 NOTE — Telephone Encounter (Signed)
Pt called to let Laura Best know that she is feeling much better and did not have to go to the hospital. She wants to know if she should come back in for a follow up after she finishes her abx.  Please advise. [phone]

## 2015-02-05 NOTE — Telephone Encounter (Signed)
Agree.  I am very glad she is ok.

## 2015-02-05 NOTE — Telephone Encounter (Signed)
Spoke to patient.  Said much better.  Told OK to just keep next routine visit in March.

## 2015-04-02 IMAGING — CT CT ABD-PELV W/O CM
3 of 4 series · 13 of 36 positions shown, 19 images · non-contrast
Comparison: CT abdomen and pelvis 08/01/2012.

CLINICAL DATA: History of para appendiceal abscess with placement
of percutaneous drain.

EXAM:
CT ABDOMEN AND PELVIS WITHOUT CONTRAST
TECHNIQUE: Multidetector CT imaging of the abdomen and pelvis was performed
following the standard protocol without intravenous contrast.

[Series 3: abd/pelvis w/o · axial · non-contrast · 0.75mm/px · z∈[-405,-60]mm · 8 of 89 slices shown, 13 images]
[im 10/89  soft-tissue]
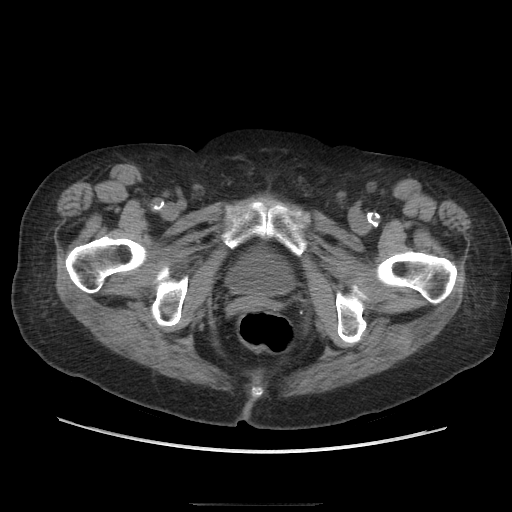
[im 10/89  bone]
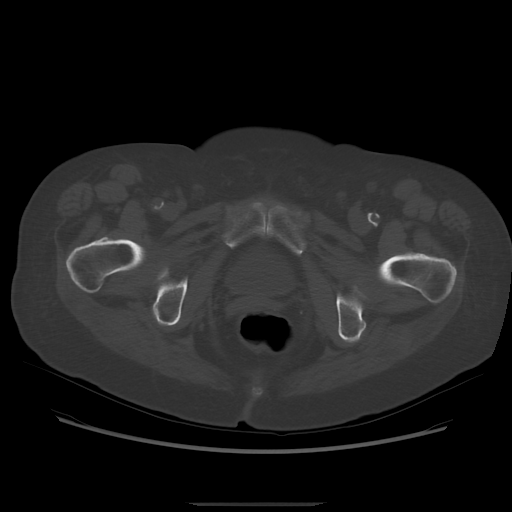
[im 20/89  soft-tissue]
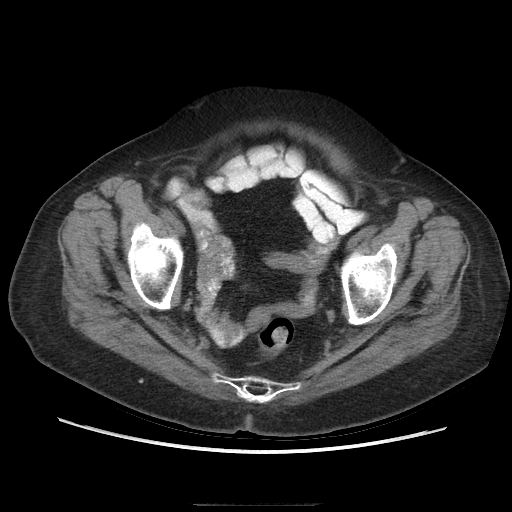
[im 30/89  soft-tissue]
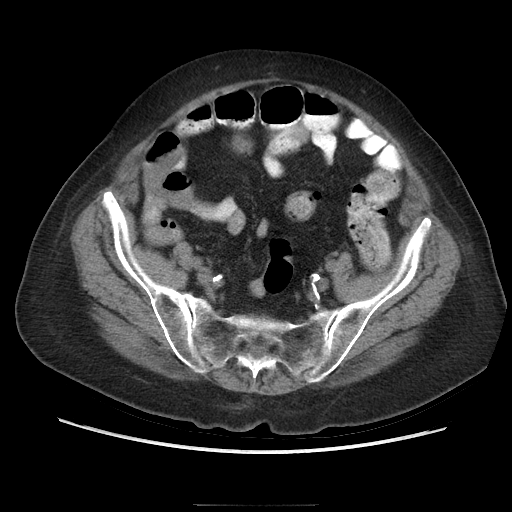
[im 40/89  soft-tissue]
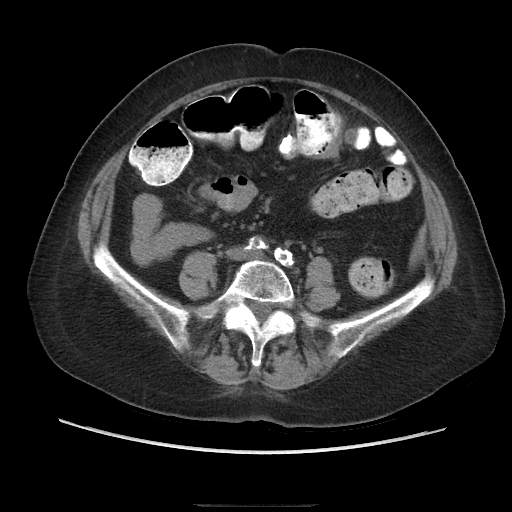
[im 49/89  soft-tissue]
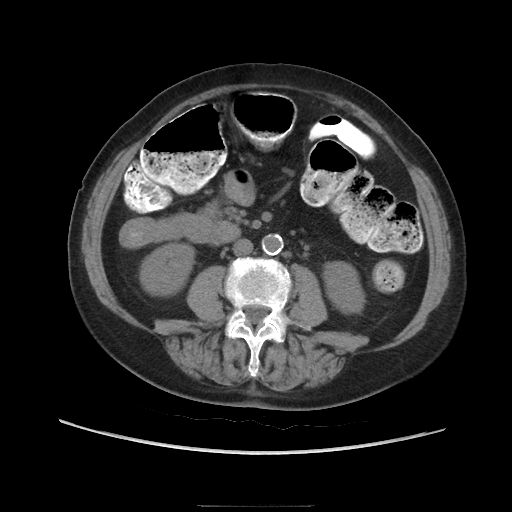
[im 49/89  lung]
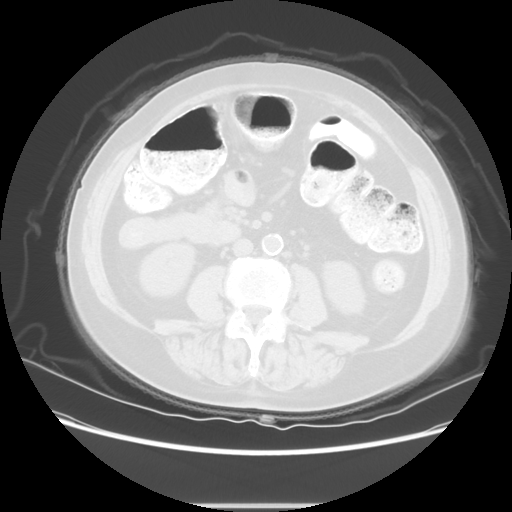
[im 59/89  soft-tissue]
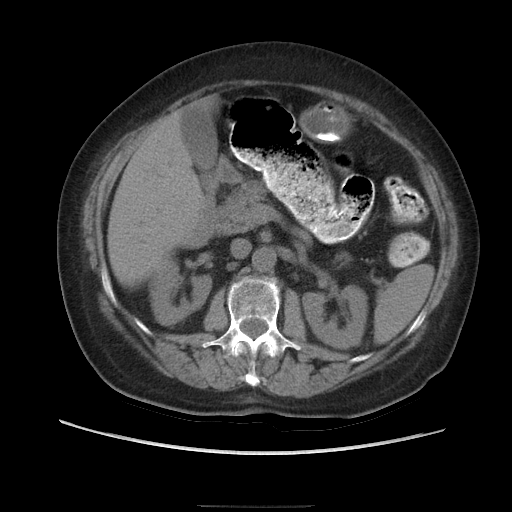
[im 59/89  lung]
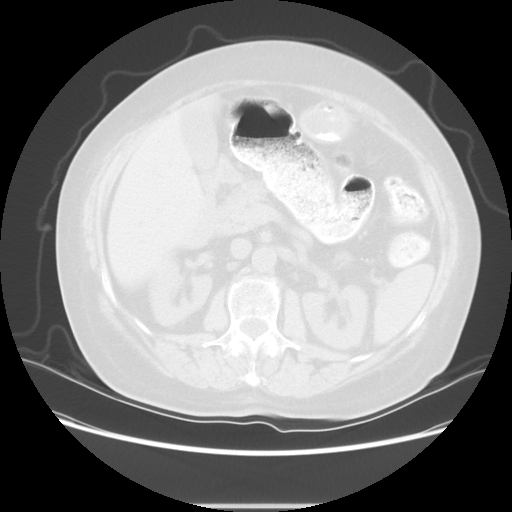
[im 69/89  soft-tissue]
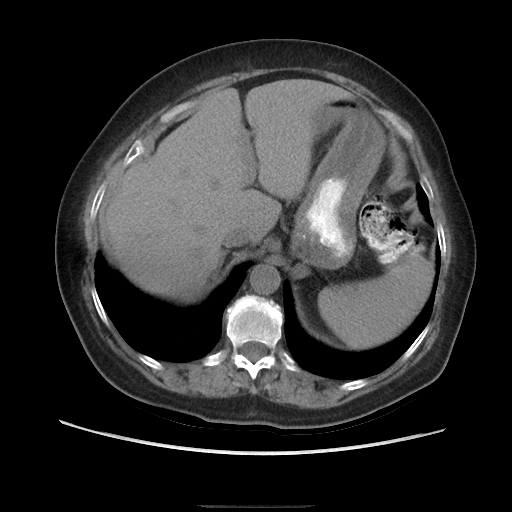
[im 69/89  lung]
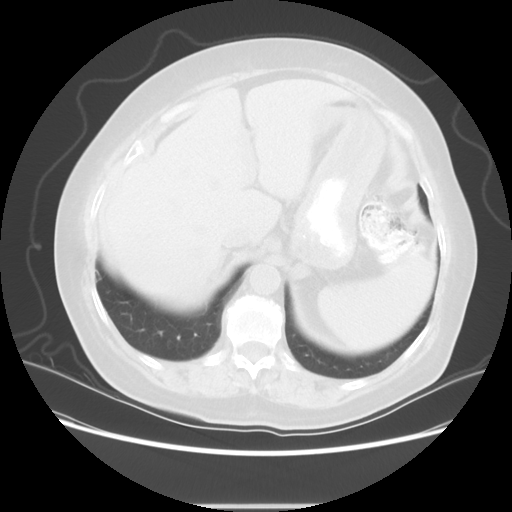
[im 79/89  soft-tissue]
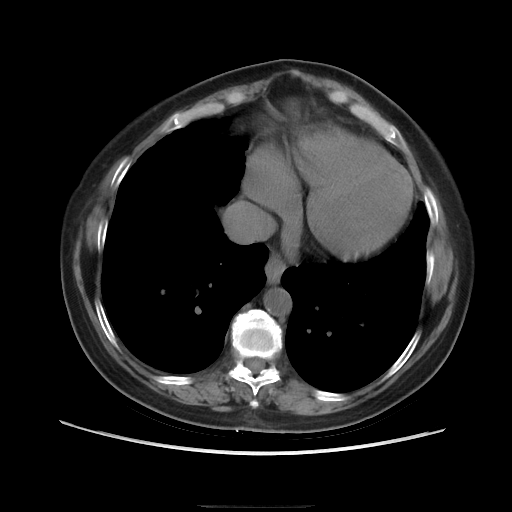
[im 79/89  lung]
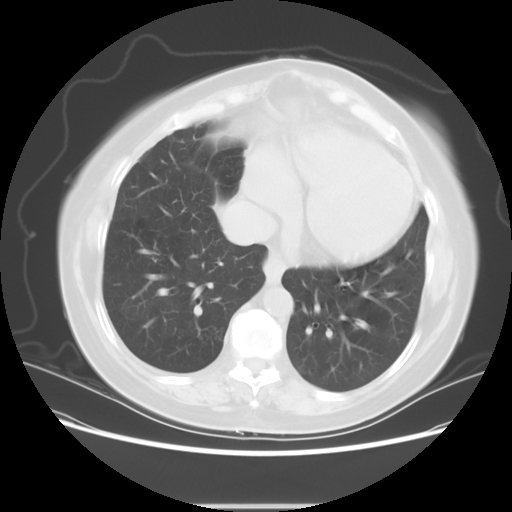

[Series 601: coronal body · coronal · 0.87mm/px · 1 of 123 slices shown, 2 images]
[im 41/123  soft-tissue]
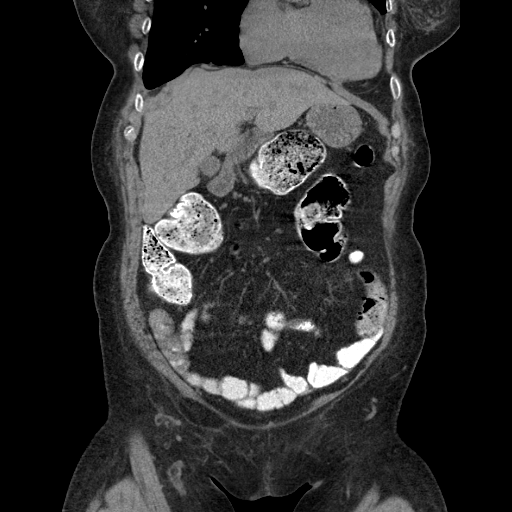
[im 41/123  bone]
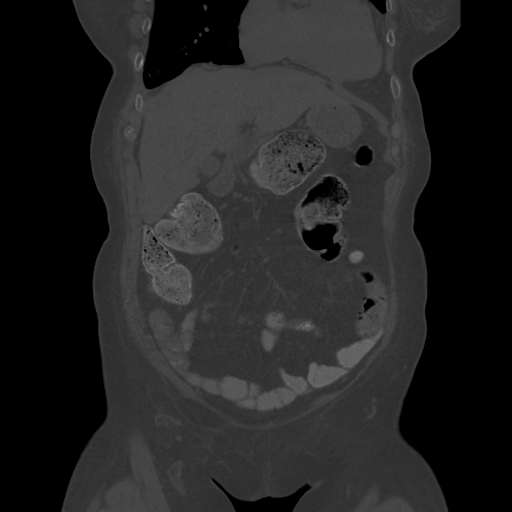

[Series 602: sagittal body · sagittal · 0.87mm/px · 4 of 153 slices shown]
[im 18/153  soft-tissue]
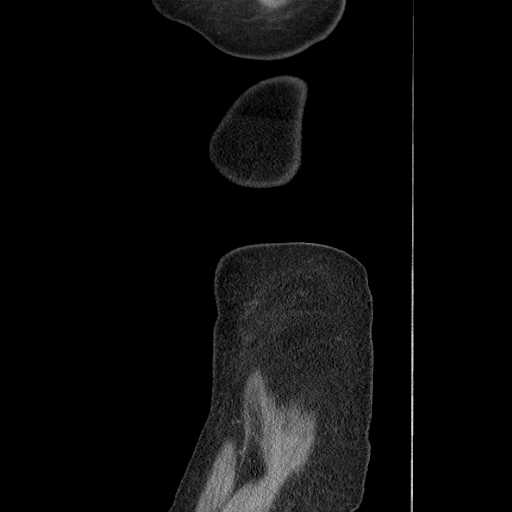
[im 36/153  soft-tissue]
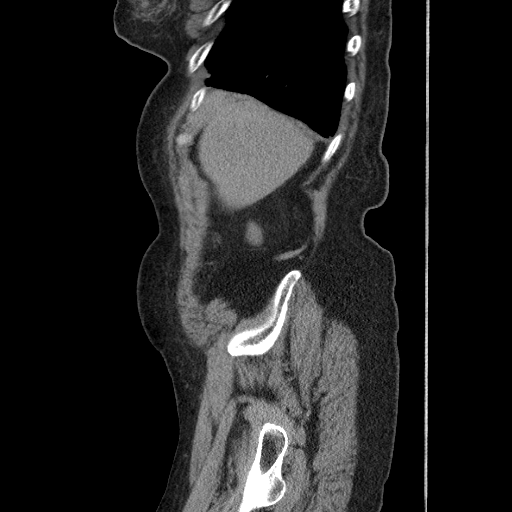
[im 54/153  soft-tissue]
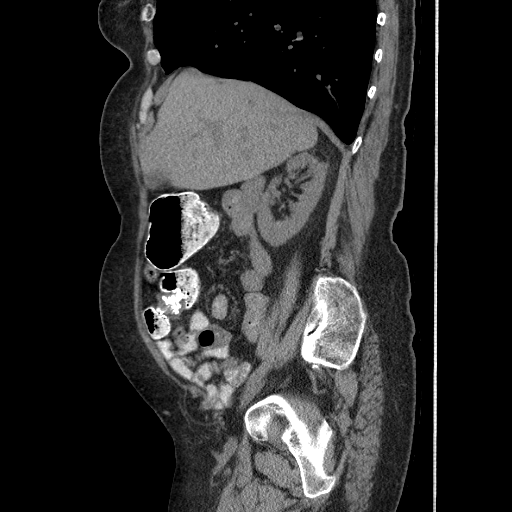
[im 72/153  soft-tissue]
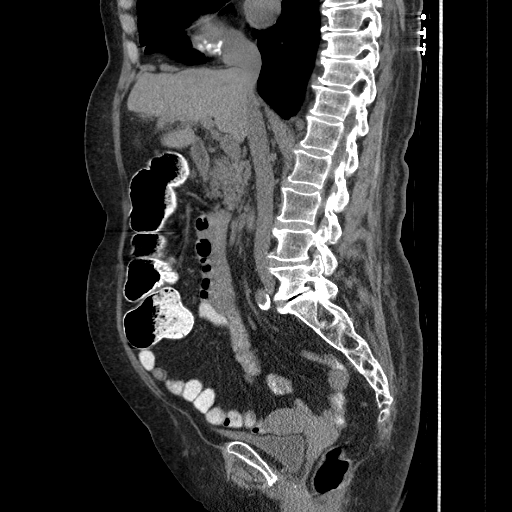

[13 of 36 positions shown; findings below may reference images not displayed]

FINDINGS: Heart size is upper normal. No pleural or pericardial effusion.
There is some scarring in the right lung base.

Drainage catheter in the left lower quadrant seen on the prior study
has been removed. There is no fluid collection in the abdomen or
pelvis. A large volume of stool is seen throughout the colon. The
colon is otherwise unremarkable. The appendix has been removed.
Small hiatal hernia is noted. The stomach is otherwise unremarkable.
Small bowel appears normal.

The liver, gallbladder, spleen, adrenal glands, pancreas and kidneys
appear normal. Extensive aortoiliac atherosclerosis without aneurysm
is noted. There is no lymphadenopathy or fluid. No lytic or
sclerotic bony lesion is identified.
IMPRESSION: No acute finding. No evidence of complication related to
periappendiceal abscess.

Atherosclerosis.

Small hiatal hernia.

## 2015-04-05 ENCOUNTER — Telehealth: Payer: Self-pay | Admitting: Family Medicine

## 2015-04-05 ENCOUNTER — Encounter: Payer: Self-pay | Admitting: Physician Assistant

## 2015-04-05 ENCOUNTER — Ambulatory Visit (INDEPENDENT_AMBULATORY_CARE_PROVIDER_SITE_OTHER): Payer: Medicare HMO | Admitting: Physician Assistant

## 2015-04-05 VITALS — BP 164/86 | HR 56 | Temp 97.9°F | Resp 18 | Ht 63.0 in | Wt 170.0 lb

## 2015-04-05 DIAGNOSIS — K219 Gastro-esophageal reflux disease without esophagitis: Secondary | ICD-10-CM | POA: Diagnosis not present

## 2015-04-05 DIAGNOSIS — I10 Essential (primary) hypertension: Secondary | ICD-10-CM

## 2015-04-05 DIAGNOSIS — Z1239 Encounter for other screening for malignant neoplasm of breast: Secondary | ICD-10-CM

## 2015-04-05 DIAGNOSIS — I272 Other secondary pulmonary hypertension: Secondary | ICD-10-CM | POA: Diagnosis not present

## 2015-04-05 DIAGNOSIS — Z794 Long term (current) use of insulin: Secondary | ICD-10-CM | POA: Diagnosis not present

## 2015-04-05 DIAGNOSIS — Z1212 Encounter for screening for malignant neoplasm of rectum: Secondary | ICD-10-CM

## 2015-04-05 DIAGNOSIS — N183 Chronic kidney disease, stage 3 unspecified: Secondary | ICD-10-CM

## 2015-04-05 DIAGNOSIS — E1121 Type 2 diabetes mellitus with diabetic nephropathy: Secondary | ICD-10-CM

## 2015-04-05 DIAGNOSIS — M810 Age-related osteoporosis without current pathological fracture: Secondary | ICD-10-CM

## 2015-04-05 DIAGNOSIS — E785 Hyperlipidemia, unspecified: Secondary | ICD-10-CM

## 2015-04-05 DIAGNOSIS — Z1211 Encounter for screening for malignant neoplasm of colon: Secondary | ICD-10-CM | POA: Diagnosis not present

## 2015-04-05 DIAGNOSIS — D638 Anemia in other chronic diseases classified elsewhere: Secondary | ICD-10-CM | POA: Diagnosis not present

## 2015-04-05 DIAGNOSIS — E559 Vitamin D deficiency, unspecified: Secondary | ICD-10-CM

## 2015-04-05 LAB — HEMOGLOBIN A1C, FINGERSTICK

## 2015-04-05 MED ORDER — OMEPRAZOLE 20 MG PO CPDR: 20.0000 mg | DELAYED_RELEASE_CAPSULE | Freq: Every day | ORAL | Status: DC

## 2015-04-05 MED ORDER — INSULIN ASPART 100 UNIT/ML FLEXPEN: PEN_INJECTOR | SUBCUTANEOUS | Status: DC

## 2015-04-05 NOTE — Telephone Encounter (Signed)
-----   Message from Orlena Sheldon, PA-C sent at 04/05/2015 12:28 PM EDT ----- I spoke with lab---they report that A1C machine did not work properly. By the time this happened, pt had already left. Therefore, unable to get A1C result.  Pt brought BS log to appt. She checks BS 3 times a day.  Tell her to increase mealtime Insulin at [facility] and Dinner/Supper from 7 units to 9 units at both of these times.

## 2015-04-05 NOTE — Progress Notes (Addendum)
Patient ID: EZARIA MELTZER MRN: [id number], DOB: May 14, 1940, 75 y.o. Date of Encounter: @DATE @  Chief Complaint:  Chief Complaint  Patient presents with  . routine check up    had juice with pills    HPI: 75 y.o. year old white female  presents for f/u OV.   I had been seeing her routinely in the past until March 2012. After that visit March 2012, I did not see her until 06/13/12.  At that time, she told me that even though she had insurance, she did not have the money to pay her co-pay so she had not come in for any office visits during that time.  She says that she only has to pay a $15 co-pay when she comes here. She says that her finances are better now. Says that she has paid off her son's truck and she no longer has that payment going out  every month.  When she came 06/13/12 she reported that she had  been continuing to use Lantus 15 units every morning through that entire period of time. She also reported that she had been seeing [facility] and they were managing her blood pressure and cholesterol during that time.  She was hospitalized 07/19/12 through 07/25/12. She had appendiceal abscess and underwent surgery by Dr. Hulen Skains.   AT OV 02/25/2014: Prior to the visit 02/25/14 Levemir was at 18 units every morning. At that visit I had her increase to Levemir 22 units every morning.  She called me the following Monday morning to report that sugar readings on that dose. She did this. Her readings looked good. Told her to continue that current dose of Levemir to 22 units. Told her to continue to check blood sugar fasting every morning but also to check some readings 2 hours postprandial. To have schedule follow-up office visit.  She had follow-up office visit 03/12/14. AT OV 03/12/14: She did bring a blood sugar log sheet. All of those readings are documented in the office note 03/12/14. Review of those readings showed that her bedtime readings were the worst and were all in the 300-400  range. At that visit I told her to continue the Levemir at 22 units. Told her to add short acting insulin at suppertime/dinnertime 5 units.  OV--06/25/2014:  Today she reports that she is still doing the Levemir 22 units.  She reports that she is doing the short acting insulin 5 units at suppertime. She did bring in blood sugar log sheet. For the month of May she has checked fasting blood sugar every morning. These are all excellent and very consistent. They all range from 102-117. She has 3 readings before lunch----260, 218, 216 She has about 10 readings for 2 hours after lunch--- 258, 301, 218, 191, 292, 198, 203, 372, 316 2 readings done before dinner are 240 and 260 She has 5 readings done 2 hours after dinner----252, 280, 190, 203, 191 She has checked bedtime readings every night----there are only 4 readings less than 200 for the entire month of May----184, 190, 186, 171 ------------ all other bedtime readings are in the 200s. Only one reading above 300 and that was one reading of 323. At that OV I told her to: Continue Levemir 22 units daily. Continue mealtime insulin 5 units at dinner/supper. Add mealtime insulin 5 units at lunch Call me if starts getting any readings less than 80  AT OV 10/05/2014: She reports that she is taking all of the insulins as listed above. She  did add the 5 units at lunchtime. She brings in blood sugar log readings with her to visit today. Most every day she has checked her sugar 3 or 4 times per day. Occasionally only 2 readings per day. Every single morning she has a fasting sugar documented. Recent fasting readings have all been very good ranging from 83-112. No low readings. Readings 2 hours after breakfast have ranged from 118-211. Readings before lunch have ranged 129-211 Readings before dinner have mostly been 148 and 158 but occasionally have been higher at 226. Readings 2 hours after dinner--she has a couple of readings around 122. Most readings are  in the 150-160 range. Some are higher in the 200-268 range. Most of the bedtime readings are around 200. ----However really cannot get these bedtime readings much lower without causing hypoglycemia by morning.  At visit 10/05/14 I checked a microalbumin which came back elevated at 15.0. Also CMET showed GFR 32 with BUN/creatinine of 32 and 1.60. At that time, I ordered referral to nephrology.  Today patient states that she did see Dr. Hinda Lenis,  Nephrologist. She says that she saw him November 4 and he did lab, urinalysis and an ultrasound. Says that she had follow-up with him November 29 and that the only medicine he has changed with her vitamin D. Says that there is been no other changes in medications. Says that at that visit she was told to follow-up 4 months.  At [place] 01/04/2015: She does bring blood sugar log sheet. Fasting readings: are mostly 105-117. There is a reading at 91 and a reading of 137. 2 hours after lunch: those readings range 209-264 Before dinner: For the month of December,  she just has a few readings and these are 175, 219, 158 Bedtime readings are 211, 238, 262, 161  She states that she is taking all blood pressure medications as directed with no lightheadedness, no lower extremity edema. She did not bring a blood pressure log form today but says that she does still check that occasionally but is always getting good readings on that.  She is taking cholesterol medication as directed with no myalgias or other adverse effects.  At [place] 04/05/2015:  She brought blood sugar log sheets with her. She has been checking blood sugar 3 times a day almost every day. For the month of March fasting sugars have been 106, 88, 100, 120, 102, 75, 82, 100, 116, 129, 107, 145, 105 ---------------------------------- nighttime readings have been 223, 188, 199, 176, 268, 240, 228, 250, 196, 289  I told her that her blood pressure is reading a little high today. She did not bring log sheet  with her blood pressure readings but says that she checks her blood pressure 3 times a day. Says that "sometimes it will go up like that but then it comes back down". Says that overall blood pressure readings have been good.  She states that she is taking all blood pressure medications as directed with no lightheadedness, no lower extremity edema.  She is taking cholesterol medication as directed with no myalgias or other adverse effects.  She does report that she "has been having some heartburn recently and is wondering what she can take for that."  She has no other complaints or concerns today. Has been feeling well.   Past Medical History  Diagnosis Date  . Diabetes mellitus   . Hypertension   . Hyperlipidemia   . Tricuspid regurgitation     Echo 05/02/06-Nml LV. Mild MR. Mod TR  .  Osteoporosis   . Diastolic heart failure ([place])   . Vitamin D deficiency   . Osteoporosis   . CKD (chronic kidney disease) stage 3, GFR 30-59 ml/min 12/25/2012     Home Meds:  Outpatient Prescriptions Prior to Visit  Medication Sig Dispense Refill  . acetaminophen (TYLENOL) 500 MG tablet Take 500 mg by mouth every 6 (six) hours as needed for pain.    Marland Kitchen amLODipine (NORVASC) 10 MG tablet TAKE ONE TABLET BY MOUTH ONCE DAILY 90 tablet 1  . aspirin EC 81 MG tablet Take 81 mg by mouth daily.    . Cholecalciferol (VITAMIN D) 2000 UNITS CAPS Take 1 capsule by mouth daily.    . cloNIDine (CATAPRES) 0.2 MG tablet Take 1 tablet (0.2 mg total) by mouth 2 (two) times daily. 60 tablet 11  . furosemide (LASIX) 40 MG tablet TAKE ONE TABLET BY MOUTH ONCE DAILY 90 tablet 1  . insulin aspart (NOVOLOG) 100 UNIT/ML FlexPen Inject 7 units 2 times daily---at lunch time and dinner/supper time.  Inject 15 minutes prior to meal or immediately after meal. (Patient taking differently: Inject 7 units 2 times daily---at lunch time and dinner/supper time.) 15 mL 11  . LEVEMIR FLEXTOUCH 100 UNIT/ML Pen INJECT 22 UNITS SUBCUTANEOUSLY  INTO THE SKIN DAILY AT 10 PM 15 mL 3  . RELION SHORT PEN NEEDLES 31G X 8 MM MISC USE ONE PEN NEEDLE ONCE DAILY AS DIRECTED 50 each 11  . simvastatin (ZOCOR) 20 MG tablet TAKE ONE TABLET BY MOUTH ONCE DAILY AT BEDTIME 90 tablet 1  . ciprofloxacin (CIPRO) 500 MG tablet Take 1 tablet (500 mg total) by mouth 2 (two) times daily. 20 tablet 0  . metroNIDAZOLE (FLAGYL) 500 MG tablet Take 1 tablet (500 mg total) by mouth 3 (three) times daily. 30 tablet 0   No facility-administered medications prior to visit.     Allergies:  Allergies  Allergen Reactions  . Ace Inhibitors Other (See Comments)    Hyperkalemia  . Actos [Pioglitazone] Swelling  . Aspirin Other (See Comments)    G.I. Upset.   . Metformin And Related Diarrhea    Social History   Social History  . Marital Status: Widowed    Spouse Name: N/A  . Number of Children: N/A  . Years of Education: N/A   Occupational History  . Not on file.   Social History Main Topics  . Smoking status: Former Smoker    Quit date: 01/19/2009  . Smokeless tobacco: Never Used  . Alcohol Use: No  . Drug Use: No  . Sexual Activity: Not on file   Other Topics Concern  . Not on file   Social History Narrative   Entered 10/2013:   She has never driven.   She lives with her son.    Family History  Problem Relation Age of Onset  . Cancer Father   . Diabetes Father   . Cancer Brother     Brain  . Cancer Sister     abdominal fat     Review of Systems:  See HPI for pertinent ROS. All other ROS negative.    Physical Exam: Blood pressure 164/86, pulse 56, temperature 97.9 F (36.6 C), temperature source Oral, resp. rate 18, height 5\' 3"  (1.6 m), weight 170 lb (77.111 kg)., Body mass index is 30.12 kg/(m^2). General: WNWD WF. Appears in no acute distress. Neck: Supple. No thyromegaly. No lymphadenopathy.No carotid bruits. Lungs: Clear bilaterally to auscultation without wheezes, rales, or rhonchi. Breathing is unlabored.  Heart: RRR  with S1 S2. No murmurs, rubs, or gallops. Abdomen: Soft, non-tender, non-distended with normoactive bowel sounds. No hepatomegaly. No rebound/guarding. No obvious abdominal masses. Musculoskeletal:  Strength and tone normal for age. Extremities/Skin: Warm and dry. No clubbing or cyanosis. No edema. No rashes or suspicious lesions. Neuro: Alert and oriented X 3. Moves all extremities spontaneously. Gait is normal. CNII-XII grossly in tact. Psych:  Responds to questions appropriately with a normal affect. Diabetic foot exam:  Inspection is normal. No open wounds or nonhealing sores. Sensation testing is normal. There are no palpable dorsalis pedis or posterior tibial pulses bilaterally.      ASSESSMENT AND PLAN:  75 y.o. year old female with  1. Diabetes type 2, uncontrolled   Continue Levemir 22 units daily. AT OV 01/04/2015: Increase mealtime insulin from 5 units to 7 units at lunch AT OV 01/04/2015: Increase mealtime insulin from 5 units to 7 units at dinner/supper. Call me if starts getting any readings less than 80  --MicroAlbumin---10/05/2014 --HgbA1C  No longer on  ACE inhibitor therapy--secondary to h/o Hyperkalemia with this.  Also on no ARB sec to this. Meds changed during past hospitalization.  We checked lipid panel 12/16/12 started statin therapy. She says that she is taking the simvastatin as directed. She had f/u FLP/LFT 06/2013---Lipid Panel Excellent, with LDL 67.   She cannot take metformin secondary to past GI adverse effects and also now she has chronic kidney disease which prohibits use of this.   At [place] 12/14 I discussed with her that she needs to have an annual diabetic eye exam.  At [place] 02/11/14-- she says she has this scheduled for Next Tuesday---"My son got that morning off work to take me that day."  However, at [place] 06/25/14--she says he wasnot able to get off work to take her--she did NOT have eye exam.  Discussed again at office visit 10/05/14----she says that she  still has not been able to have eye exam. She is aware this is recommended.  At prior OV  I discussed proper foot care. Discussed the effects of the diabetes has on her blood vessels as bar as blood flow to her feet. Also discussed the effects it has on peripheral nerves. Discussed that she may have decreased sensation and not feel pain and sores as a normal person would. Therefore needs to wear shoes and be very protective of her feet. Also discussed proper care regarding trimming her toenails etc. If she ever does see any small sores developing that she needs to come in for evaluation immediately. Diabetic foot exam--documented in quality metrics 10/05/2014  Discussed need for proper exercise. She states she gets outside and walks whenever possible. She definitely does this when the weather is nice and says even if it is somewhat cold she still tries to get out of remain active.  We have discussed carbohydrate diet in the past that have been a long time. I reviewed a low carbohydrate diet handout with her and discussed details of specific examples of things to avoid as well as gave her examples of meals that she can eat.  Glaucoma Screening: At [facility] 10/30/13-- I did referral to ophthalmology for diabetic eye exam and glaucoma screening. At her visit on 02/11/14 she reported that she has appointment with the eye doctor "next Tuesday and so her son is going to take off that morning from work to take her to that appointment" However, at [place] 06/25/14--she says he wasnot able to get off work to  take her--she did NOT have eye exam.  She is aware this is recommended.   2. Hypertension  At prior visit with me I reviewed office note by cardiology dated 06/2012. They had performed a renal artery ultrasound which was negative. In their notes they documented that they reviewed the fact that she has labile blood pressure readings.  They also felt the patient had white coat hypertension.  At [place] 03/2015--BP high  but she says she checks BP 3 times a day and sometimes BP will go up, then will come down and that, on average, BP good.  She was on ACE Inhibitor, until hyperkalemia prohibited this.  At [place] 12/2012 I noted and did: Her heart rate is low so I do not think I should add a beta blocker. Bystolic is going to be expensive. i will Increase her clonidine. She will stop using the patch and switch over to pill form. - cloNIDine (CATAPRES) 0.3 MG tablet; Take 1 tablet (0.3 mg total) by mouth 2 (two) times daily.  Dispense: 60 tablet; Refill: 5  Blood pressure is now stable/controlled. Patient checks it frequently at home and is getting good readings. Continue current medications with no change.  3. CKD (chronic kidney disease) stage 3, GFR 30-59 ml/min In past, we have discussed need to keep BP and BS well controlled.  She is now seeing Dr. Hinda Lenis, Renal.  Her first appointment with him was November 4 and she have follow-up there November 29 and says that that time she was told to follow-up there 4 months.  4. Hyperlipidemia On simvastatin  Had FLP/LFT recently  5. Osteoporosis  She has been on Fosamax in the past.  Will not do further bone density test as I would not do further treatment regardless of the test results.  6. Vitamin D deficiency Was on 4,000 IU QD---At [place] 12/2014--she says that Dr. Hinda Lenis adjusted Vit D dose.  7. Pulmonary hypertension Managed by Cardiology  8. COPD (chronic obstructive pulmonary disease) Quit smoking 4 years ago.  9. H/O Normocytic anemia  GERD --omeprazole 20mg  QD PRN  Screening for colorectal cancer She states that the last colonoscopy was less than 10 years ago. At that time she was told she can repeat 10 years. Not due yet. At [place] 06/25/14 she reports she recently saw her GI. Was told last colonoscopy 6 yrs ago--4 more years until next one due. Was told they would see her in 4 years, or sooner if needed.    Immunizations: Pneumonia vaccine: She received the Pneumovax 11/30/09.   Prevnar 13 --given here 12/2012.  Tetanus:  We gave here 12/2012. Zostavax: Told her to contact her insurance company to find out her cost for this. If she wishes to proceed , she can call us and we can send an order in to the pharmacy for her to go there for the injection. Influenza vaccine: Was given here 10/05/2014    SHE HAD [facility]PE 10/30/2013. THE FOLLOWING IS COPIED FROM THAT NOTE:  Visit for preventive health examination  A. Screening Labs: She has had recent CBC, CMET. Her last lipid panel was 07/04/13 which showed triglycerides 70, HDL 58, LDL 67  B. [id number].  C. Screening Mammogram: Last mammogram was 02/25/2013  D. DEXA/BMD: She has been on Fosamax in the past.  Will not do further bone density test as I would not do further treatment regardless of the test results. E. Colorectal Cancer Screening:  Last colonoscopy was by Dr. Benson Norway 08/13/2008 F. Immunizations:  Influenza: 09/2013, 10/05/2014 Tetanus: 12/2012 Pneumococcal: She received the Pneumovax 11/30/09. Prevnar 13 --given here 12/2012.  Zostavax: she defers.  ROV 3 months, sooner if needed.   Signed, [address] [place], [place], [facility] 04/05/2015 11:07 AM

## 2015-04-05 NOTE — Telephone Encounter (Signed)
Pt aware of change to insulin.

## 2015-04-06 ENCOUNTER — Other Ambulatory Visit: Payer: Self-pay | Admitting: Adult Health

## 2015-04-16 DIAGNOSIS — E1165 Type 2 diabetes mellitus with hyperglycemia: Secondary | ICD-10-CM | POA: Diagnosis not present

## 2015-04-23 ENCOUNTER — Other Ambulatory Visit: Payer: Self-pay | Admitting: Physician Assistant

## 2015-04-23 NOTE — Telephone Encounter (Signed)
Medication refilled per protocol. 

## 2015-05-12 ENCOUNTER — Other Ambulatory Visit: Payer: Self-pay | Admitting: Physician Assistant

## 2015-05-12 NOTE — Telephone Encounter (Signed)
Medication refilled per protocol. 

## 2015-05-19 DIAGNOSIS — D509 Iron deficiency anemia, unspecified: Secondary | ICD-10-CM | POA: Diagnosis not present

## 2015-05-19 DIAGNOSIS — E559 Vitamin D deficiency, unspecified: Secondary | ICD-10-CM | POA: Diagnosis not present

## 2015-05-19 DIAGNOSIS — N183 Chronic kidney disease, stage 3 (moderate): Secondary | ICD-10-CM | POA: Diagnosis not present

## 2015-05-19 DIAGNOSIS — Z79899 Other long term (current) drug therapy: Secondary | ICD-10-CM | POA: Diagnosis not present

## 2015-05-19 DIAGNOSIS — I10 Essential (primary) hypertension: Secondary | ICD-10-CM | POA: Diagnosis not present

## 2015-05-19 DIAGNOSIS — R809 Proteinuria, unspecified: Secondary | ICD-10-CM | POA: Diagnosis not present

## 2015-06-09 DIAGNOSIS — E559 Vitamin D deficiency, unspecified: Secondary | ICD-10-CM | POA: Diagnosis not present

## 2015-06-09 DIAGNOSIS — N184 Chronic kidney disease, stage 4 (severe): Secondary | ICD-10-CM | POA: Diagnosis not present

## 2015-06-09 DIAGNOSIS — R809 Proteinuria, unspecified: Secondary | ICD-10-CM | POA: Diagnosis not present

## 2015-06-09 DIAGNOSIS — E870 Hyperosmolality and hypernatremia: Secondary | ICD-10-CM | POA: Diagnosis not present

## 2015-06-28 DIAGNOSIS — Z79899 Other long term (current) drug therapy: Secondary | ICD-10-CM | POA: Diagnosis not present

## 2015-06-28 DIAGNOSIS — I10 Essential (primary) hypertension: Secondary | ICD-10-CM | POA: Diagnosis not present

## 2015-06-28 DIAGNOSIS — N183 Chronic kidney disease, stage 3 (moderate): Secondary | ICD-10-CM | POA: Diagnosis not present

## 2015-06-30 DIAGNOSIS — K219 Gastro-esophageal reflux disease without esophagitis: Secondary | ICD-10-CM | POA: Diagnosis not present

## 2015-06-30 DIAGNOSIS — E7800 Pure hypercholesterolemia, unspecified: Secondary | ICD-10-CM | POA: Diagnosis not present

## 2015-06-30 DIAGNOSIS — E119 Type 2 diabetes mellitus without complications: Secondary | ICD-10-CM | POA: Diagnosis not present

## 2015-06-30 DIAGNOSIS — I10 Essential (primary) hypertension: Secondary | ICD-10-CM | POA: Diagnosis not present

## 2015-06-30 DIAGNOSIS — Z794 Long term (current) use of insulin: Secondary | ICD-10-CM | POA: Diagnosis not present

## 2015-06-30 DIAGNOSIS — Z0000 Encounter for general adult medical examination without abnormal findings: Secondary | ICD-10-CM | POA: Diagnosis not present

## 2015-07-08 ENCOUNTER — Ambulatory Visit: Payer: Medicare HMO | Admitting: Physician Assistant

## 2015-07-09 DIAGNOSIS — E1165 Type 2 diabetes mellitus with hyperglycemia: Secondary | ICD-10-CM | POA: Diagnosis not present

## 2015-07-12 ENCOUNTER — Encounter: Payer: Self-pay | Admitting: Adult Health

## 2015-07-12 ENCOUNTER — Ambulatory Visit (INDEPENDENT_AMBULATORY_CARE_PROVIDER_SITE_OTHER): Payer: Medicare HMO | Admitting: Adult Health

## 2015-07-12 VITALS — BP 152/72 | HR 85 | Wt 174.0 lb

## 2015-07-12 DIAGNOSIS — I10 Essential (primary) hypertension: Secondary | ICD-10-CM

## 2015-07-12 DIAGNOSIS — I519 Heart disease, unspecified: Secondary | ICD-10-CM

## 2015-07-12 DIAGNOSIS — I5189 Other ill-defined heart diseases: Secondary | ICD-10-CM

## 2015-07-12 MED ORDER — AMLODIPINE BESYLATE 10 MG PO TABS: 10.0000 mg | ORAL_TABLET | Freq: Every day | ORAL | Status: DC

## 2015-07-12 NOTE — Patient Instructions (Signed)
Your physician wants you to follow-up in: 1 Year.  You will receive a reminder letter in the mail two months in advance. If you don't receive a letter, please call our office to schedule the follow-up appointment.  Your physician recommends that you continue on your current medications as directed. Please refer to the Current Medication list given to you today.  If you need a refill on your cardiac medications before your next appointment, please call your pharmacy.  Thank you for choosing [facility]!

## 2015-07-12 NOTE — Progress Notes (Signed)
Cardiology Office Note   Date:  07/12/2015   ID:  WILEY SEELINGER, DOB 1940/08/24, MRN [id number]  PCP:  [name], PA-C  Cardiologist:  Jory Sims, NP [facility]  Chief Complaint  Patient presents with  . Hypertension      History of Present Illness: Laura Best is a 75 y.o. female who presents for ongoing assessment and management of uncontrolled hypertension and diastolic CHF. The patient was last seen in the office by Kerin Ransom, PA, December 2016.at that time the patient's blood pressure was well controlled on current medication regimen and she was without complaint.most recent Myoview was completed in 2014 which was a low risk but did reveal grade 2 diastolic dysfunction.  She is here without any complaints today. She is medically compliant, she is being followed by nephrology having monthly labs, she is also being followed by primary care for cholesterol status. She denies any hospitalizations, new symptoms, or issues with medications.  Past Medical History  Diagnosis Date  . Diabetes mellitus   . Hypertension   . Hyperlipidemia   . Tricuspid regurgitation     Echo 05/02/06-Nml LV. Mild MR. Mod TR  . Osteoporosis   . Diastolic heart failure ([place])   . Vitamin D deficiency   . Osteoporosis   . CKD (chronic kidney disease) stage 3, GFR 30-59 ml/min 12/25/2012    Past Surgical History  Procedure Laterality Date  . Heart chamber revision      patched hole --1993  . Tubal ligation    . Laparoscopic appendectomy N/A 09/27/2013    Procedure: APPENDECTOMY - CONVERTED FROM LAPAROSCOPIC;  Surgeon: Donnie Mesa, MD;  Location: [facility] OR;  Service: General;  Laterality: N/A;     Current Outpatient Prescriptions  Medication Sig Dispense Refill  . acetaminophen (TYLENOL) 500 MG tablet Take 500 mg by mouth every 6 (six) hours as needed for pain.    Marland Kitchen amLODipine (NORVASC) 10 MG tablet Take 1 tablet (10 mg total) by mouth daily. 90 tablet 3  . aspirin EC 81 MG tablet Take 81 mg  by mouth daily.    . Cholecalciferol (VITAMIN D) 2000 UNITS CAPS Take 1 capsule by mouth daily.    . cloNIDine (CATAPRES) 0.2 MG tablet TAKE ONE TABLET BY MOUTH TWICE DAILY 60 tablet 3  . furosemide (LASIX) 40 MG tablet TAKE ONE TABLET BY MOUTH ONCE DAILY 90 tablet 3  . insulin aspart (NOVOLOG) 100 UNIT/ML FlexPen Take 9 units with luch and supper.  Inject 15 minutes prior to meal or immediately after meal. 15 mL 11  . LEVEMIR FLEXTOUCH 100 UNIT/ML Pen INJECT 22 UNITS SUBCUTANEOUSLY ONCE DAILY AT  10  PM 15 mL 2  . lisinopril (PRINIVIL,ZESTRIL) 2.5 MG tablet Take 2.5 mg by mouth daily.     Marland Kitchen omeprazole (PRILOSEC) 20 MG capsule Take 1 capsule (20 mg total) by mouth daily. 30 capsule 3  . RELION SHORT PEN NEEDLES 31G X 8 MM MISC USE ONE PEN NEEDLE ONCE DAILY AS DIRECTED 50 each 11  . simvastatin (ZOCOR) 20 MG tablet TAKE ONE TABLET BY MOUTH ONCE DAILY AT BEDTIME 90 tablet 3   No current facility-administered medications for this visit.    Allergies:   Ace inhibitors; Actos; Aspirin; and Metformin and related    Social History:  The patient  reports that she quit smoking about 6 years ago. She has never used smokeless tobacco. She reports that she does not drink alcohol or use illicit drugs.   Family History:  The patient's family history includes Cancer in her brother, father, and sister; Diabetes in her father.    ROS: All other systems are reviewed and negative. Unless otherwise mentioned in H&P    PHYSICAL EXAM: VS:  BP 152/72 mmHg  Pulse 85  Wt 174 lb (78.926 kg)  SpO2 97% , BMI Body mass index is 30.83 kg/(m^2). GEN: Well nourished, well developed, in no acute distress HEENT: normal Neck: no JVD, carotid bruits, or masses Cardiac: RRR; no murmurs, rubs, or gallops,no edema  Respiratory: Clear to auscultation bilaterally, normal work of breathing GI: soft, nontender, nondistended, + BS MS: no deformity or atrophy Skin: warm and dry, no rash Neuro:  Strength and sensation are  intact Psych: euthymic mood, full affect   Recent Labs: 01/04/2015: ALT 11; BUN 31*; Creat 1.57*; Potassium 4.3; Sodium 140    Lipid Panel    Component Value Date/Time   CHOL 157 01/04/2015 1100   TRIG 124 01/04/2015 1100   HDL 55 01/04/2015 1100   CHOLHDL 2.9 01/04/2015 1100   VLDL 25 01/04/2015 1100   LDLCALC 77 01/04/2015 1100      Wt Readings from Last 3 Encounters:  07/12/15 174 lb (78.926 kg)  04/05/15 170 lb (77.111 kg)  01/29/15 168 lb (76.204 kg)     ASSESSMENT AND PLAN:  1. Hypertension: blood pressure is moderately elevated today, she brings with me blood pressure readings from her home, blood pressure in the left arm 125/66, blood pressure in the right arm 132/68. Heart rate ranged from 71-79. She is completely asymptomatic. Labs are followed by nephrology and primary care. Will not make any changes in her regimen at this time. She is given extra refills on amlodipine and lisinopril.  2. Diastolic Dysfunction: no evidence of diastolic CHF, edema, or weight gain. She is doing very well. Will not make any changes in her regimen. She continues to take Lasix daily as directed. As stated, labs are followed by nephrology and primary care.   Current medicines are reviewed at length with the patient today.    Labs/ tests ordered today include: No orders of the defined types were placed in this encounter.     Disposition:   FU with one year unless symptomatic.  Signed, Jory Sims, NP  07/12/2015 1:07 PM    [facility] [address], [place], [place] [address] Phone: [phone]; Fax: [phone]

## 2015-07-12 NOTE — Progress Notes (Signed)
Name: Laura Best    DOB: 06-04-1940  Age: 75 y.o.  MR#: [id number]       PCP:  [name], PA-C      Insurance: Payor: [employer] MEDICARE / Plan: [employer] MEDICARE HMO/PPO / Product Type: *No Product type* /   CC:    Chief Complaint  Patient presents with  . Hypertension    VS Filed Vitals:   07/12/15 1256  BP: 152/72  Pulse: 85  Weight: 174 lb (78.926 kg)  SpO2: 97%    Weights Current Weight  07/12/15 174 lb (78.926 kg)  04/05/15 170 lb (77.111 kg)  01/29/15 168 lb (76.204 kg)    Blood Pressure  BP Readings from Last 3 Encounters:  07/12/15 152/72  04/05/15 164/86  01/29/15 170/70     Admit date:  (Not on file) Last encounter with RMR:  04/06/2015   Allergy Ace inhibitors; Actos; Aspirin; and Metformin and related  Current Outpatient Prescriptions  Medication Sig Dispense Refill  . acetaminophen (TYLENOL) 500 MG tablet Take 500 mg by mouth every 6 (six) hours as needed for pain.    Marland Kitchen amLODipine (NORVASC) 10 MG tablet TAKE ONE TABLET BY MOUTH ONCE DAILY 90 tablet 1  . aspirin EC 81 MG tablet Take 81 mg by mouth daily.    . Cholecalciferol (VITAMIN D) 2000 UNITS CAPS Take 1 capsule by mouth daily.    . cloNIDine (CATAPRES) 0.2 MG tablet TAKE ONE TABLET BY MOUTH TWICE DAILY 60 tablet 3  . furosemide (LASIX) 40 MG tablet TAKE ONE TABLET BY MOUTH ONCE DAILY 90 tablet 3  . insulin aspart (NOVOLOG) 100 UNIT/ML FlexPen Take 9 units with luch and supper.  Inject 15 minutes prior to meal or immediately after meal. 15 mL 11  . LEVEMIR FLEXTOUCH 100 UNIT/ML Pen INJECT 22 UNITS SUBCUTANEOUSLY ONCE DAILY AT  10  PM 15 mL 2  . lisinopril (PRINIVIL,ZESTRIL) 2.5 MG tablet Take 2.5 mg by mouth daily.     Marland Kitchen omeprazole (PRILOSEC) 20 MG capsule Take 1 capsule (20 mg total) by mouth daily. 30 capsule 3  . RELION SHORT PEN NEEDLES 31G X 8 MM MISC USE ONE PEN NEEDLE ONCE DAILY AS DIRECTED 50 each 11  . simvastatin (ZOCOR) 20 MG tablet TAKE ONE TABLET BY MOUTH ONCE DAILY AT BEDTIME 90 tablet  3   No current facility-administered medications for this visit.    Discontinued Meds:   There are no discontinued medications.  Patient Active Problem List   Diagnosis Date Noted  . GERD (gastroesophageal reflux disease) 04/05/2015  . Diastolic dysfunction [id number]  . Acute diverticulitis 05/03/2014  . Acute appendicitis with peritoneal abscess 09/27/2013  . Bradycardia 04/02/2013  . Hyperkalemia 01/22/2013  . Acute renal failure ([place]) 01/21/2013  . Anemia of chronic disease 01/21/2013  . Acute bronchitis 01/20/2013  . CHF exacerbation ([place]) 01/19/2013  . CKD (chronic kidney disease) stage 3, GFR 30-59 ml/min 12/25/2012  . Screening for colorectal cancer 12/25/2012  . Breast cancer screening 12/25/2012  . Vitamin D deficiency   . Osteoporosis   . Postop check 08/13/2012  . Appendiceal abscess 07/30/2012  . Fever, unspecified 07/19/2012  . Abdominal pain, other specified site 07/19/2012  . SOB (shortness of breath) 07/19/2012  . ARF (acute renal failure) ([place]) 07/19/2012  . Abscess, appendix 07/19/2012  . Acute diastolic heart failure ([place]) 02/04/2011  . Type 2 diabetes mellitus with renal manifestations ([place]) 02/02/2011  . Pulmonary hypertension ([place]) 02/02/2011  . COPD exacerbation ([place]) 02/02/2011  .  Hypokalemia 02/02/2011  . Normocytic anemia 02/02/2011  . Essential hypertension   . Hyperlipidemia     LABS    Component Value Date/Time   NA 140 01/04/2015 1100   NA 138 10/05/2014 1045   NA 139 06/01/2014 1209   K 4.3 01/04/2015 1100   K 4.5 10/05/2014 1045   K 4.2 06/01/2014 1209   CL 102 01/04/2015 1100   CL 101 10/05/2014 1045   CL 102 06/01/2014 1209   CO2 27 01/04/2015 1100   CO2 25 10/05/2014 1045   CO2 25 06/01/2014 1209   GLUCOSE 182* 01/04/2015 1100   GLUCOSE 228* 10/05/2014 1045   GLUCOSE 184* 06/01/2014 1209   GLUCOSE 163* 07/19/2012 1005   GLUCOSE 181* 06/13/2012 1129   BUN 31* 01/04/2015 1100   BUN 32* 10/05/2014 1045   BUN 32*  06/01/2014 1209   CREATININE 1.57* 01/04/2015 1100   CREATININE 1.60* 10/05/2014 1045   CREATININE 1.85* 06/01/2014 1209   CREATININE 1.63* 05/07/2014 0805   CREATININE 1.83* 05/06/2014 0529   CREATININE 1.67* 05/05/2014 0600   CALCIUM 9.4 01/04/2015 1100   CALCIUM 9.2 10/05/2014 1045   CALCIUM 9.1 06/01/2014 1209   GFRNONAA 32* 01/04/2015 1100   GFRNONAA 32* 10/05/2014 1045   GFRNONAA 27* 06/01/2014 1209   GFRNONAA 30* 05/07/2014 0805   GFRNONAA 26* 05/06/2014 0529   GFRNONAA 29* 05/05/2014 0600   GFRAA 37* 01/04/2015 1100   GFRAA 36* 10/05/2014 1045   GFRAA 31* 06/01/2014 1209   GFRAA 35* 05/07/2014 0805   GFRAA 30* 05/06/2014 0529   GFRAA 34* 05/05/2014 0600   CMP     Component Value Date/Time   NA 140 01/04/2015 1100   K 4.3 01/04/2015 1100   CL 102 01/04/2015 1100   CO2 27 01/04/2015 1100   GLUCOSE 182* 01/04/2015 1100   GLUCOSE 163* 07/19/2012 1005   BUN 31* 01/04/2015 1100   CREATININE 1.57* 01/04/2015 1100   CREATININE 1.63* 05/07/2014 0805   CALCIUM 9.4 01/04/2015 1100   PROT 7.2 01/04/2015 1100   ALBUMIN 3.8 01/04/2015 1100   AST 13 01/04/2015 1100   ALT 11 01/04/2015 1100   ALKPHOS 103 01/04/2015 1100   BILITOT 0.4 01/04/2015 1100   GFRNONAA 32* 01/04/2015 1100   GFRNONAA 30* 05/07/2014 0805   GFRAA 37* 01/04/2015 1100   GFRAA 35* 05/07/2014 0805       Component Value Date/Time   WBC 11.3* 05/07/2014 0805   WBC 11.8* 05/06/2014 0529   WBC 16.7* 05/05/2014 0600   HGB 12.3 05/07/2014 0805   HGB 11.7* 05/06/2014 0529   HGB 11.7* 05/05/2014 0600   HCT 37.7 05/07/2014 0805   HCT 36.7 05/06/2014 0529   HCT 36.2 05/05/2014 0600   MCV 83.4 05/07/2014 0805   MCV 84.6 05/06/2014 0529   MCV 84.0 05/05/2014 0600    Lipid Panel     Component Value Date/Time   CHOL 157 01/04/2015 1100   TRIG 124 01/04/2015 1100   HDL 55 01/04/2015 1100   CHOLHDL 2.9 01/04/2015 1100   VLDL 25 01/04/2015 1100   LDLCALC 77 01/04/2015 1100    ABG    Component Value  Date/Time   TCO2 22 12/24/2008 2235     Lab Results  Component Value Date   TSH 1.316 01/19/2013   BNP (last 3 results) No results for input(s): BNP in the last 8760 hours.  ProBNP (last 3 results) No results for input(s): PROBNP in the last 8760 hours.  Cardiac Panel (last 3  results) No results for input(s): CKTOTAL, CKMB, TROPONINI, RELINDX in the last 72 hours.  Iron/TIBC/Ferritin/ %Sat    Component Value Date/Time   IRON 25* 01/19/2013 0830   TIBC 233* 01/19/2013 0830   FERRITIN 301* 01/19/2013 0830   IRONPCTSAT 11* 01/19/2013 0830     EKG Orders placed or performed in visit on 01/13/15  . EKG 12-Lead     Prior Assessment and Plan Problem List as of 07/12/2015      Cardiovascular and Mediastinum   Essential hypertension   Last Assessment & Plan 01/13/2015 Office Visit Written 01/13/2015  2:54 PM by Erlene Quan, PA-C    Controlled      Pulmonary hypertension ([place])   Acute diastolic heart failure [facility])   Last Assessment & Plan 01/09/2013 Office Visit Written 01/09/2013  2:07 PM by Lendon Colonel, NP    No evidence of fluid retention or decompensation. Continue current treatment plan. Follow up with new cardiologist on next visit.      CHF exacerbation [facility])   Last Assessment & Plan 03/17/2013 Office Visit Written 03/17/2013  3:53 PM by Lendon Colonel, NP    No evidence of heart failure, fluid retention, or symptoms thereof. She has gained some weight, but I do not find this to be fluid related. She states she has been eating more with her new diabetic medication causing her to feel hungry. She is also eating healthier. Have asked her to please continue to weigh  daily on a digital scale and record as she does her blood pressure.        Respiratory   COPD exacerbation [facility])   Last Assessment & Plan 02/20/2011 Office Visit Written 02/20/2011 12:27 PM by Lendon Colonel, NP    Breathing status is stable. No excessive DOE.      Acute bronchitis      Digestive   Abscess, appendix   Appendiceal abscess   Acute appendicitis with peritoneal abscess   GERD (gastroesophageal reflux disease)     Endocrine   Type 2 diabetes mellitus with renal manifestations [facility])   Last Assessment & Plan 01/13/2015 Office Visit Written 01/13/2015  2:57 PM by Erlene Quan, PA-C    Followed by PCP        Musculoskeletal and Integument   Osteoporosis     Genitourinary   CKD (chronic kidney disease) stage 3, GFR 30-59 ml/min   Last Assessment & Plan 01/13/2015 Office Visit Written 01/13/2015  2:57 PM by Erlene Quan, PA-C    Followed by PCP- GFR 30      ARF (acute renal failure) [facility])   Last Assessment & Plan 07/19/2012 Office Visit Written 07/19/2012  2:47 PM by Alycia Rossetti, MD    Acute renal failure noted on labs 3 days ago this will need to be repeated to see if this has worsened      Acute renal failure [facility])     Other   Hyperlipidemia   Last Assessment & Plan 01/13/2015 Office Visit Written 01/13/2015  2:59 PM by Erlene Quan, PA-C    Controlled- LDL 77 Dec Q000111Q      Diastolic dysfunction   Last Assessment & Plan 01/13/2015 Office Visit Written 01/13/2015  2:56 PM by Erlene Quan, PA-C    Grade 2 by echo Dec 2014      Hypokalemia   Normocytic anemia   Fever, unspecified   Last Assessment & Plan 07/19/2012 Office Visit Written 07/19/2012  2:47 PM by Lonell Grandchild  Donato Schultz, MD    Patient has some type of infection brewing for white count in the office was 19,000 this is up from 16,003 days ago. Differentials include pneumonia versus diverticulitis or other abdominal infection. Send to emergency room for repeat chest x-ray CT scan IV fluids.      Abdominal pain, other specified site   Last Assessment & Plan 07/19/2012 Office Visit Written 07/19/2012  2:47 PM by Alycia Rossetti, MD    Per above, send to ER for further work-up , CT scan      SOB (shortness of breath)   Last Assessment & Plan 03/17/2013 Office Visit Written 03/17/2013  3:52  PM by Lendon Colonel, NP    She wishes to stop her oxygen at home. She states that her breathing status is fine she does like it short of breath and would like them to come and pick it up. I checked her oxygen saturation here in the office and it was 98%. If she is not using it or wearing it, I have advised her to call them and have a oxygen provider to pick it up.       Postop check   Vitamin D deficiency   Screening for colorectal cancer   Breast cancer screening   Anemia of chronic disease   Hyperkalemia   Bradycardia   Acute diverticulitis       Imaging: No results found.

## 2015-07-14 ENCOUNTER — Ambulatory Visit (INDEPENDENT_AMBULATORY_CARE_PROVIDER_SITE_OTHER): Payer: Medicare HMO | Admitting: Physician Assistant

## 2015-07-14 VITALS — BP 120/62 | HR 76 | Temp 98.2°F | Resp 18 | Ht 63.0 in | Wt 171.0 lb

## 2015-07-14 DIAGNOSIS — I10 Essential (primary) hypertension: Secondary | ICD-10-CM | POA: Diagnosis not present

## 2015-07-14 DIAGNOSIS — Z1239 Encounter for other screening for malignant neoplasm of breast: Secondary | ICD-10-CM

## 2015-07-14 DIAGNOSIS — K219 Gastro-esophageal reflux disease without esophagitis: Secondary | ICD-10-CM | POA: Diagnosis not present

## 2015-07-14 DIAGNOSIS — N183 Chronic kidney disease, stage 3 unspecified: Secondary | ICD-10-CM

## 2015-07-14 DIAGNOSIS — Z1211 Encounter for screening for malignant neoplasm of colon: Secondary | ICD-10-CM | POA: Diagnosis not present

## 2015-07-14 DIAGNOSIS — Z794 Long term (current) use of insulin: Secondary | ICD-10-CM

## 2015-07-14 DIAGNOSIS — I272 Other secondary pulmonary hypertension: Secondary | ICD-10-CM | POA: Diagnosis not present

## 2015-07-14 DIAGNOSIS — D638 Anemia in other chronic diseases classified elsewhere: Secondary | ICD-10-CM

## 2015-07-14 DIAGNOSIS — E1121 Type 2 diabetes mellitus with diabetic nephropathy: Secondary | ICD-10-CM

## 2015-07-14 DIAGNOSIS — M810 Age-related osteoporosis without current pathological fracture: Secondary | ICD-10-CM

## 2015-07-14 DIAGNOSIS — E1129 Type 2 diabetes mellitus with other diabetic kidney complication: Secondary | ICD-10-CM | POA: Diagnosis not present

## 2015-07-14 DIAGNOSIS — E785 Hyperlipidemia, unspecified: Secondary | ICD-10-CM | POA: Diagnosis not present

## 2015-07-14 DIAGNOSIS — E559 Vitamin D deficiency, unspecified: Secondary | ICD-10-CM | POA: Diagnosis not present

## 2015-07-14 DIAGNOSIS — Z1212 Encounter for screening for malignant neoplasm of rectum: Secondary | ICD-10-CM

## 2015-07-14 LAB — COMPLETE METABOLIC PANEL WITH GFR
ALT: 10 U/L (ref 6–29)
AST: 14 U/L (ref 10–35)
Albumin: 3.9 g/dL (ref 3.6–5.1)
Alkaline Phosphatase: 88 U/L (ref 33–130)
BILIRUBIN TOTAL: 0.4 mg/dL (ref 0.2–1.2)
BUN: 43 mg/dL — ABNORMAL HIGH (ref 7–25)
CHLORIDE: 105 mmol/L (ref 98–110)
CO2: 26 mmol/L (ref 20–31)
Calcium: 9.1 mg/dL (ref 8.6–10.4)
Creat: 2.21 mg/dL — ABNORMAL HIGH (ref 0.60–0.93)
GFR, EST AFRICAN AMERICAN: 25 mL/min — AB (ref >=60)
GFR, EST NON AFRICAN AMERICAN: 21 mL/min — AB (ref >=60)
Glucose, Bld: 167 mg/dL — ABNORMAL HIGH (ref 70–99)
POTASSIUM: 5.2 mmol/L (ref 3.5–5.3)
SODIUM: 141 mmol/L (ref 135–146)
Total Protein: 7.3 g/dL (ref 6.1–8.1)

## 2015-07-14 NOTE — Progress Notes (Addendum)
Patient ID: Laura Best MRN: [id number], DOB: [birth year], 75 y.o. Date of Encounter: @DATE @  Chief Complaint:  Chief Complaint  Patient presents with  . OTHER    3 month check up    HPI: 75 y.o. year old white female  presents for f/u OV.   I had been seeing her routinely in the past until March 2012. After that visit March 2012, I did not see her until 06/13/12.  At that time, she told me that even though she had insurance, she did not have the money to pay her co-pay so she had not come in for any office visits during that time.  She says that she only has to pay a $15 co-pay when she comes here. She says that her finances are better now. Says that she has paid off her son's truck and she no longer has that payment going out  every month.  When she came 06/13/12 she reported that she had  been continuing to use Lantus 15 units every morning through that entire period of time. She also reported that she had been seeing [facility] and they were managing her blood pressure and cholesterol during that time.  She was hospitalized 07/19/12 through 07/25/12. She had appendiceal abscess and underwent surgery by Dr. Hulen Skains.   AT OV 02/25/2014: Prior to the visit 02/25/14 Levemir was at 18 units every morning. At that visit I had her increase to Levemir 22 units every morning.  She called me the following Monday morning to report that sugar readings on that dose. She did this. Her readings looked good. Told her to continue that current dose of Levemir to 22 units. Told her to continue to check blood sugar fasting every morning but also to check some readings 2 hours postprandial. To have schedule follow-up office visit.  She had follow-up office visit 03/12/14. AT OV 03/12/14: She did bring a blood sugar log sheet. All of those readings are documented in the office note 03/12/14. Review of those readings showed that her bedtime readings were the worst and were all in the 300-400 range. At that  visit I told her to continue the Levemir at 22 units. Told her to add short acting insulin at suppertime/dinnertime 5 units.  OV--06/25/2014:  Today she reports that she is still doing the Levemir 22 units.  She reports that she is doing the short acting insulin 5 units at suppertime. She did bring in blood sugar log sheet. For the month of May she has checked fasting blood sugar every morning. These are all excellent and very consistent. They all range from 102-117. She has 3 readings before lunch----260, 218, 216 She has about 10 readings for 2 hours after lunch--- 258, 301, 218, 191, 292, 198, 203, 372, 316 2 readings done before dinner are 240 and 260 She has 5 readings done 2 hours after dinner----252, 280, 190, 203, 191 She has checked bedtime readings every night----there are only 4 readings less than 200 for the entire month of May----184, 190, 186, 171 ------------ all other bedtime readings are in the 200s. Only one reading above 300 and that was one reading of 323. At that OV I told her to: Continue Levemir 22 units daily. Continue mealtime insulin 5 units at dinner/supper. Add mealtime insulin 5 units at lunch Call me if starts getting any readings less than 80  AT OV 10/05/2014: She reports that she is taking all of the insulins as listed above. She did add  the 5 units at lunchtime. She brings in blood sugar log readings with her to visit today. Most every day she has checked her sugar 3 or 4 times per day. Occasionally only 2 readings per day. Every single morning she has a fasting sugar documented. Recent fasting readings have all been very good ranging from 83-112. No low readings. Readings 2 hours after breakfast have ranged from 118-211. Readings before lunch have ranged 129-211 Readings before dinner have mostly been 148 and 158 but occasionally have been higher at 226. Readings 2 hours after dinner--she has a couple of readings around 122. Most readings are in the 150-160  range. Some are higher in the 200-268 range. Most of the bedtime readings are around 200. ----However really cannot get these bedtime readings much lower without causing hypoglycemia by morning.  At visit 10/05/14 I checked a microalbumin which came back elevated at 15.0. Also CMET showed GFR 32 with BUN/creatinine of 32 and 1.60. At that time, I ordered referral to nephrology.  Today patient states that she did see Dr. Hinda Lenis,  Nephrologist. She says that she saw him November 4 and he did lab, urinalysis and an ultrasound. Says that she had follow-up with him November 29 and that the only medicine he has changed with her vitamin D. Says that there is been no other changes in medications. Says that at that visit she was told to follow-up 4 months.  At [place] 01/04/2015: She does bring blood sugar log sheet. Fasting readings: are mostly 105-117. There is a reading at 91 and a reading of 137. 2 hours after lunch: those readings range 209-264 Before dinner: For the month of December,  she just has a few readings and these are 175, 219, 158 Bedtime readings are 211, 238, 262, 161  She states that she is taking all blood pressure medications as directed with no lightheadedness, no lower extremity edema. She did not bring a blood pressure log form today but says that she does still check that occasionally but is always getting good readings on that.  She is taking cholesterol medication as directed with no myalgias or other adverse effects.  At [place] 04/05/2015:  She brought blood sugar log sheets with her. She has been checking blood sugar 3 times a day almost every day. For the month of March fasting sugars have been 106, 88, 100, 120, 102, 75, 82, 100, 116, 129, 107, 145, 105 ---------------------------------- nighttime readings have been 223, 188, 199, 176, 268, 240, 228, 250, 196, 289  I told her that her blood pressure is reading a little high today. She did not bring log sheet with her blood  pressure readings but says that she checks her blood pressure 3 times a day. Says that "sometimes it will go up like that but then it comes back down". Says that overall blood pressure readings have been good.  At [place] 07/14/2015:  She brings BS log sheets from past 6 months.  Recent readings good.  Fasting AM readings: 115, 110, 132, 98, 139.  HS readings: 153, 133, 260, 200, 196, 159  Says she saw Cardiologist this week--told her can wait 1 year for f/u. Says she saw Dr. Hinda Lenis couple weeks ago. To f/u September. Had labs with [facility] with him.  Asked if had gotten to see eye doctor yet--"Not yet, honey"-- will see if sister-in-law or son can take her.   Still checking her BP also and getting good readings on that too.   She states that  she is taking all blood pressure medications as directed with no lightheadedness, no lower extremity edema.  She is taking cholesterol medication as directed with no myalgias or other adverse effects.  She does report that she "has been having some heartburn recently and is wondering what she can take for that."  She has no other complaints or concerns today. Has been feeling well.  ADDENDUM ADDED 09/01/2015: Received copy of office notes and labs performed by Nephrology Clinic---Dr. Ulice Bold --At office visit he added low dose ACE-- 2.5 mg daily. Patient had follow-up BMET 2 weeks later and creatinine had actually decreased from 1.70 down to 1.58. ACE inhibitor continued. All other labs in aspect of his care were stable and no other medications added or changed. Planned to have her follow-up in 3 months.   Past Medical History  Diagnosis Date  . Diabetes mellitus   . Hypertension   . Hyperlipidemia   . Tricuspid regurgitation     Echo 05/02/06-Nml LV. Mild MR. Mod TR  . Osteoporosis   . Diastolic heart failure ([place])   . Vitamin D deficiency   . Osteoporosis   . CKD (chronic kidney disease) stage 3, GFR 30-59 ml/min 12/25/2012     Home  Meds:  Outpatient Prescriptions Prior to Visit  Medication Sig Dispense Refill  . acetaminophen (TYLENOL) 500 MG tablet Take 500 mg by mouth every 6 (six) hours as needed for pain.    Marland Kitchen amLODipine (NORVASC) 10 MG tablet Take 1 tablet (10 mg total) by mouth daily. 90 tablet 3  . aspirin EC 81 MG tablet Take 81 mg by mouth daily.    . Cholecalciferol (VITAMIN D) 2000 UNITS CAPS Take 1 capsule by mouth daily.    . cloNIDine (CATAPRES) 0.2 MG tablet TAKE ONE TABLET BY MOUTH TWICE DAILY 60 tablet 3  . furosemide (LASIX) 40 MG tablet TAKE ONE TABLET BY MOUTH ONCE DAILY 90 tablet 3  . insulin aspart (NOVOLOG) 100 UNIT/ML FlexPen Take 9 units with luch and supper.  Inject 15 minutes prior to meal or immediately after meal. 15 mL 11  . LEVEMIR FLEXTOUCH 100 UNIT/ML Pen INJECT 22 UNITS SUBCUTANEOUSLY ONCE DAILY AT  10  PM 15 mL 2  . lisinopril (PRINIVIL,ZESTRIL) 2.5 MG tablet Take 2.5 mg by mouth daily.     Marland Kitchen omeprazole (PRILOSEC) 20 MG capsule Take 1 capsule (20 mg total) by mouth daily. 30 capsule 3  . RELION SHORT PEN NEEDLES 31G X 8 MM MISC USE ONE PEN NEEDLE ONCE DAILY AS DIRECTED 50 each 11  . simvastatin (ZOCOR) 20 MG tablet TAKE ONE TABLET BY MOUTH ONCE DAILY AT BEDTIME 90 tablet 3   No facility-administered medications prior to visit.     Allergies:  Allergies  Allergen Reactions  . Ace Inhibitors Other (See Comments)    Hyperkalemia  . Actos [Pioglitazone] Swelling  . Aspirin Other (See Comments)    G.I. Upset.   . Metformin And Related Diarrhea    Social History   Social History  . Marital Status: Widowed    Spouse Name: N/A  . Number of Children: N/A  . Years of Education: N/A   Occupational History  . Not on file.   Social History Main Topics  . Smoking status: Former Smoker    Quit date: 01/19/2009  . Smokeless tobacco: Never Used  . Alcohol Use: No  . Drug Use: No  . Sexual Activity: Not on file   Other Topics Concern  . Not on  file   Social History Narrative    Entered 10/2013:   She has never driven.   She lives with her son.    Family History  Problem Relation Age of Onset  . Cancer Father   . Diabetes Father   . Cancer Brother     Brain  . Cancer Sister     abdominal fat     Review of Systems:  See HPI for pertinent ROS. All other ROS negative.    Physical Exam: Blood pressure 120/62, pulse 76, temperature 98.2 F (36.8 C), temperature source Oral, resp. rate 18, height 5\' 3"  (1.6 m), weight 171 lb (77.565 kg), SpO2 97 %., Body mass index is 30.3 kg/(m^2). General: WNWD WF. Appears in no acute distress. Neck: Supple. No thyromegaly. No lymphadenopathy.No carotid bruits. Lungs: Clear bilaterally to auscultation without wheezes, rales, or rhonchi. Breathing is unlabored. Heart: RRR with S1 S2. No murmurs, rubs, or gallops. Abdomen: Soft, non-tender, non-distended with normoactive bowel sounds. No hepatomegaly. No rebound/guarding. No obvious abdominal masses. Musculoskeletal:  Strength and tone normal for age. Extremities/Skin: Warm and dry. No clubbing or cyanosis. No edema. No rashes or suspicious lesions. Neuro: Alert and oriented X 3. Moves all extremities spontaneously. Gait is normal. CNII-XII grossly in tact. Psych:  Responds to questions appropriately with a normal affect. Diabetic foot exam:  Inspection is normal. No open wounds or nonhealing sores. Sensation testing is normal. There are no palpable dorsalis pedis or posterior tibial pulses bilaterally.      ASSESSMENT AND PLAN:  75 y.o. year old female with  1. Diabetes type 2, uncontrolled   Continue Levemir 22 units daily.----------------------------------------------------------------------------------At OV 07/14/2015---Verified--she IS using this dose AT OV 01/04/2015: Increase mealtime insulin from 5 units to 7 units at lunch-------------At OV 07/14/2015-----verified--she is using 7 units at lunch AT OV 01/04/2015: Increase mealtime insulin from 5 units to 7 units  at dinner/supper.---A tOv 07/14/2015---verified---she is using 7 untis at supper Call me if starts getting any readings less than 80  --MicroAlbumin---10/05/2014 --HgbA1C  No longer on  ACE inhibitor therapy--secondary to h/o Hyperkalemia with this.  Also on no ARB sec to this. Meds changed during past hospitalization.  We checked lipid panel 12/16/12 started statin therapy. She says that she is taking the simvastatin as directed. She had f/u FLP/LFT 06/2013---Lipid Panel Excellent, with LDL 67.   She cannot take metformin secondary to past GI adverse effects and also now she has chronic kidney disease which prohibits use of this.   At [place] 12/14 I discussed with her that she needs to have an annual diabetic eye exam.  At [place] 02/11/14-- she says she has this scheduled for Next Tuesday---"My son got that morning off work to take me that day."  However, at [place] 06/25/14--she says he wasnot able to get off work to take her--she did NOT have eye exam.  Discussed again at office visit 10/05/14----she says that she still has not been able to have eye exam. Discussed again at [place] 07/14/2015---says she will see if sister-in-law or son can take her She is aware this is recommended.  At prior OV  I discussed proper foot care. Discussed the effects of the diabetes has on her blood vessels as bar as blood flow to her feet. Also discussed the effects it has on peripheral nerves. Discussed that she may have decreased sensation and not feel pain and sores as a normal person would. Therefore needs to wear shoes and be very protective of her feet. Also discussed  proper care regarding trimming her toenails etc. If she ever does see any small sores developing that she needs to come in for evaluation immediately. Diabetic foot exam--documented in quality metrics 10/05/2014  Discussed need for proper exercise. She states she gets outside and walks whenever possible. She definitely does this when the weather is nice and says  even if it is somewhat cold she still tries to get out of remain active.  We have discussed carbohydrate diet in the past that have been a long time. I reviewed a low carbohydrate diet handout with her and discussed details of specific examples of things to avoid as well as gave her examples of meals that she can eat.  Glaucoma Screening: At [facility] 10/30/13-- I did referral to ophthalmology for diabetic eye exam and glaucoma screening. At her visit on 02/11/14 she reported that she has appointment with the eye doctor "next Tuesday and so her son is going to take off that morning from work to take her to that appointment" However, at [place] 06/25/14--she says he wasnot able to get off work to take her--she did NOT have eye exam.  She is aware this is recommended.   2. Hypertension  At prior visit with me I reviewed office note by cardiology dated 06/2012. They had performed a renal artery ultrasound which was negative. In their notes they documented that they reviewed the fact that she has labile blood pressure readings.  They also felt the patient had white coat hypertension.  At OV 03/2015--BP high but she says she checks BP 3 times a day and sometimes BP will go up, then will come down and that, on average, BP good.  She was on ACE Inhibitor, until hyperkalemia prohibited this.  At [place] 12/2012 I noted and did: Her heart rate is low so I do not think I should add a beta blocker. Bystolic is going to be expensive. i will Increase her clonidine. She will stop using the patch and switch over to pill form. - cloNIDine (CATAPRES) 0.3 MG tablet; Take 1 tablet (0.3 mg total) by mouth 2 (two) times daily.  Dispense: 60 tablet; Refill: 5  Blood pressure is now stable/controlled. Patient checks it frequently at home and is getting good readings. Continue current medications with no change.  3. CKD (chronic kidney disease) stage 3, GFR 30-59 ml/min In past, we have discussed need to keep BP and BS well controlled.   ---------------She is now seeing Dr. Hinda Lenis, Renal. -------------------  ADDENDUM ADDED 09/01/2015: Received copy of office notes and labs performed by Nephrology Clinic---Dr. Ulice Bold --At office visit he added low dose ACE-- 2.5 mg daily. Patient had follow-up BMET 2 weeks later and creatinine had actually decreased from 1.70 down to 1.58. ACE inhibitor continued. All other labs in aspect of his care were stable and no other medications added or changed. Planned to have her follow-up in 3 months.    4. Hyperlipidemia On simvastatin  Had FLP/LFT recently  5. Osteoporosis  She has been on Fosamax in the past.  Will not do further bone density test as I would not do further treatment regardless of the test results.  6. Vitamin D deficiency Was on 4,000 IU QD---At OV 12/2014--she says that Dr. Hinda Lenis adjusted Vit D dose.  7. Pulmonary hypertension Managed by Cardiology  8. COPD (chronic obstructive pulmonary disease) Quit smoking 4 years ago.  9. H/O Normocytic anemia  GERD --omeprazole 20mg  QD PRN  Screening for colorectal cancer She states that the  last colonoscopy was less than 10 years ago. At that time she was told she can repeat 10 years. Not due yet. At [place] 06/25/14 she reports she recently saw her GI. Was told last colonoscopy 6 yrs ago--4 more years until next one due. Was told they would see her in 4 years, or sooner if needed.   Immunizations: Pneumonia vaccine: She received the Pneumovax 11/30/09.   Prevnar 13 --given here 12/2012.  Tetanus:  We gave here 12/2012. Zostavax: Told her to contact her insurance company to find out her cost for this. If she wishes to proceed , she can call us and we can send an order in to the pharmacy for her to go there for the injection. Influenza vaccine: Was given here 10/05/2014    SHE HAD CPE 10/30/2013. THE FOLLOWING IS COPIED FROM THAT NOTE:  Visit for preventive health examination  A.  Screening Labs: She has had recent CBC, CMET. Her last lipid panel was 07/04/13 which showed triglycerides 70, HDL 58, LDL 67  B. [id number].  C. Screening Mammogram: Last mammogram was 02/25/2013  D. DEXA/BMD: She has been on Fosamax in the past.  Will not do further bone density test as I would not do further treatment regardless of the test results. E. Colorectal Cancer Screening:  Last colonoscopy was by Dr. Benson Norway 08/13/2008 F. Immunizations:  Influenza: 09/2013, 10/05/2014 Tetanus: 12/2012 Pneumococcal: She received the Pneumovax 11/30/09. Prevnar 13 --given here 12/2012.  Zostavax: she defers.  ROV 3 months, sooner if needed.   Signed, [address] [place], [place], [facility] 07/14/2015 11:26 AM

## 2015-07-15 LAB — HEMOGLOBIN A1C
HEMOGLOBIN A1C: 7.7 % — AB (ref <5.7)
Mean Plasma Glucose: 174 mg/dL

## 2015-08-13 ENCOUNTER — Other Ambulatory Visit: Payer: Self-pay | Admitting: Physician Assistant

## 2015-08-24 ENCOUNTER — Telehealth: Payer: Self-pay | Admitting: Family Medicine

## 2015-08-24 NOTE — Telephone Encounter (Signed)
Pt complaining about numbness and tingling in hands and feet.  One side worse then other.  Wants to be seen on Friday when daughter can bring her.  appt made.

## 2015-08-27 ENCOUNTER — Encounter: Payer: Self-pay | Admitting: Family Medicine

## 2015-08-27 ENCOUNTER — Ambulatory Visit (INDEPENDENT_AMBULATORY_CARE_PROVIDER_SITE_OTHER): Payer: Medicare HMO | Admitting: Family Medicine

## 2015-08-27 VITALS — BP 146/60 | HR 70 | Temp 98.0°F | Resp 14 | Ht 63.0 in | Wt 171.0 lb

## 2015-08-27 DIAGNOSIS — E1121 Type 2 diabetes mellitus with diabetic nephropathy: Secondary | ICD-10-CM

## 2015-08-27 DIAGNOSIS — E0842 Diabetes mellitus due to underlying condition with diabetic polyneuropathy: Secondary | ICD-10-CM | POA: Diagnosis not present

## 2015-08-27 MED ORDER — GABAPENTIN 100 MG PO CAPS: ORAL_CAPSULE | ORAL | 3 refills | Status: DC

## 2015-08-27 NOTE — Patient Instructions (Signed)
Take the gabapentin- take 1 capsule at bedtime for 2 weeks, then go to 2 capsules  We will check on kidney doctor  F/U 6 weeks [place]

## 2015-08-29 ENCOUNTER — Encounter: Payer: Self-pay | Admitting: Family Medicine

## 2015-08-29 NOTE — Progress Notes (Signed)
   Subjective:    Patient ID: Laura Best, female    DOB: 11/11/1940, 75 y.o.   MRN: [id number]  Patient presents for Peripheral Neuropathy (reports that she can't grasp well with hands and has numbness and tingling in B feet)  Past 3-4 weeks has noticed numbness and tingling in her hands and feet. Feet burn and hurt after walking evening short distances. Denies any new joint pain or swelling. Also gets numbness in finger tips makes it hard to grasp things around the house. Worse in left hand  DM- states blood sugars have been good, did not bring her meter she is taking meds as prescribed  Denies any falls, denies neck pain, back pain, no radiating symptoms of tingling or numbness, no change in bowel or bladder   I REVIEWED her last set of labs, Creatinine had increased to 2.21, states she saw her kidney doctor  Review Of Systems:  GEN- denies fatigue, fever, weight loss,weakness, recent illness HEENT- denies eye drainage, change in vision, nasal discharge, CVS- denies chest pain, palpitations RESP- denies SOB, cough, wheeze ABD- denies N/V, change in stools, abd pain GU- denies dysuria, hematuria, dribbling, incontinence MSK- + joint pain, muscle aches, injury Neuro- denies headache, dizziness, syncope, seizure activity       Objective:    BP (!) 146/60 (BP Location: Left Arm, Patient Position: Sitting, Cuff Size: Normal)   Pulse 70   Temp 98 F (36.7 C) (Oral)   Resp 14   Ht 5\' 3"  (1.6 m)   Wt 171 lb (77.6 kg)   BMI 30.29 kg/m  GEN- NAD, alert and oriented x3 HEENT- PERRL, EOMI, non injected sclera, pink conjunctiva, MMM, oropharynx clear Neck- Supple, fair ROM, neg spurlings  CVS- RRR, no murmur RESP-CTAB Neuro-CNII-XII in tact, decreased monofilament bilat feet, toes, can feel near mid foot and heels, normal monofilament hands, fair grasp hands, able to make fist  Neg tinels EXT- No edema Pulses- Radial, DP- 2+        Assessment & Plan:      Problem List  Items Addressed This Visit    Type 2 diabetes mellitus with renal manifestations (HCC) - Primary (Chronic)    Diabetes is fairly well controlled, but baesd on symptoms I think she has peripheral neuropthy,decreased monofilament today mostly in feet Trial of gabapentin as inhibiting her activites, start with 100mg , can increase to 200mg  Her blood flow appears to be adequate. F/u in a few weeks see how she is doing with gabapentin Possible carpal tunnel but at this age and no issues in past that is questionable regardings hands I am sure she also has component of OA in hands       Other Visit Diagnoses    Diabetic polyneuropathy associated with diabetes mellitus due to underlying condition ([place])       Relevant Medications   gabapentin (NEURONTIN) 100 MG capsule      Note: This dictation was prepared with Dragon dictation along with smaller phrase technology. Any transcriptional errors that result from this process are unintentional.

## 2015-08-29 NOTE — Assessment & Plan Note (Signed)
Diabetes is fairly well controlled, but baesd on symptoms I think she has peripheral neuropthy,decreased monofilament today mostly in feet Trial of gabapentin as inhibiting her activites, start with 100mg , can increase to 200mg  Her blood flow appears to be adequate. F/u in a few weeks see how she is doing with gabapentin

## 2015-09-24 DIAGNOSIS — D509 Iron deficiency anemia, unspecified: Secondary | ICD-10-CM | POA: Diagnosis not present

## 2015-09-24 DIAGNOSIS — Z79899 Other long term (current) drug therapy: Secondary | ICD-10-CM | POA: Diagnosis not present

## 2015-09-24 DIAGNOSIS — E559 Vitamin D deficiency, unspecified: Secondary | ICD-10-CM | POA: Diagnosis not present

## 2015-09-24 DIAGNOSIS — N183 Chronic kidney disease, stage 3 (moderate): Secondary | ICD-10-CM | POA: Diagnosis not present

## 2015-09-24 DIAGNOSIS — I10 Essential (primary) hypertension: Secondary | ICD-10-CM | POA: Diagnosis not present

## 2015-09-24 DIAGNOSIS — R809 Proteinuria, unspecified: Secondary | ICD-10-CM | POA: Diagnosis not present

## 2015-10-01 DIAGNOSIS — D638 Anemia in other chronic diseases classified elsewhere: Secondary | ICD-10-CM | POA: Diagnosis not present

## 2015-10-01 DIAGNOSIS — E559 Vitamin D deficiency, unspecified: Secondary | ICD-10-CM | POA: Diagnosis not present

## 2015-10-01 DIAGNOSIS — R809 Proteinuria, unspecified: Secondary | ICD-10-CM | POA: Diagnosis not present

## 2015-10-01 DIAGNOSIS — N184 Chronic kidney disease, stage 4 (severe): Secondary | ICD-10-CM | POA: Diagnosis not present

## 2015-10-01 DIAGNOSIS — I10 Essential (primary) hypertension: Secondary | ICD-10-CM | POA: Diagnosis not present

## 2015-10-06 ENCOUNTER — Telehealth: Payer: Self-pay

## 2015-10-06 NOTE — Telephone Encounter (Signed)
Pt called to confirm her next appt

## 2015-10-18 ENCOUNTER — Encounter: Payer: Self-pay | Admitting: Physician Assistant

## 2015-10-18 ENCOUNTER — Other Ambulatory Visit: Payer: Self-pay | Admitting: Family Medicine

## 2015-10-18 ENCOUNTER — Ambulatory Visit (INDEPENDENT_AMBULATORY_CARE_PROVIDER_SITE_OTHER): Payer: Medicare HMO | Admitting: Physician Assistant

## 2015-10-18 VITALS — BP 118/58 | HR 45 | Temp 97.9°F | Resp 14 | Wt 172.0 lb

## 2015-10-18 DIAGNOSIS — E1121 Type 2 diabetes mellitus with diabetic nephropathy: Secondary | ICD-10-CM | POA: Diagnosis not present

## 2015-10-18 DIAGNOSIS — N183 Chronic kidney disease, stage 3 unspecified: Secondary | ICD-10-CM

## 2015-10-18 DIAGNOSIS — I272 Other secondary pulmonary hypertension: Secondary | ICD-10-CM | POA: Diagnosis not present

## 2015-10-18 DIAGNOSIS — Z794 Long term (current) use of insulin: Principal | ICD-10-CM

## 2015-10-18 DIAGNOSIS — I10 Essential (primary) hypertension: Secondary | ICD-10-CM | POA: Diagnosis not present

## 2015-10-18 DIAGNOSIS — M810 Age-related osteoporosis without current pathological fracture: Secondary | ICD-10-CM

## 2015-10-18 DIAGNOSIS — IMO0002 Reserved for concepts with insufficient information to code with codable children: Secondary | ICD-10-CM

## 2015-10-18 DIAGNOSIS — K219 Gastro-esophageal reflux disease without esophagitis: Secondary | ICD-10-CM

## 2015-10-18 DIAGNOSIS — E118 Type 2 diabetes mellitus with unspecified complications: Principal | ICD-10-CM

## 2015-10-18 DIAGNOSIS — E559 Vitamin D deficiency, unspecified: Secondary | ICD-10-CM

## 2015-10-18 DIAGNOSIS — Z23 Encounter for immunization: Secondary | ICD-10-CM | POA: Diagnosis not present

## 2015-10-18 DIAGNOSIS — E785 Hyperlipidemia, unspecified: Secondary | ICD-10-CM | POA: Diagnosis not present

## 2015-10-18 DIAGNOSIS — E1165 Type 2 diabetes mellitus with hyperglycemia: Secondary | ICD-10-CM

## 2015-10-18 LAB — COMPLETE METABOLIC PANEL WITH GFR
ALBUMIN: 3.9 g/dL (ref 3.6–5.1)
ALK PHOS: 83 U/L (ref 33–130)
ALT: 9 U/L (ref 6–29)
AST: 12 U/L (ref 10–35)
BUN: 49 mg/dL — AB (ref 7–25)
CO2: 24 mmol/L (ref 20–31)
Calcium: 9 mg/dL (ref 8.6–10.4)
Chloride: 105 mmol/L (ref 98–110)
Creat: 2.13 mg/dL — ABNORMAL HIGH (ref 0.60–0.93)
GFR, Est African American: 26 mL/min — ABNORMAL LOW (ref >=60)
GFR, Est Non African American: 22 mL/min — ABNORMAL LOW (ref >=60)
Glucose, Bld: 133 mg/dL — ABNORMAL HIGH (ref 70–99)
POTASSIUM: 5.6 mmol/L — AB (ref 3.5–5.3)
SODIUM: 138 mmol/L (ref 135–146)
Total Bilirubin: 0.3 mg/dL (ref 0.2–1.2)
Total Protein: 6.8 g/dL (ref 6.1–8.1)

## 2015-10-18 LAB — HEMOGLOBIN A1C
Hgb A1c MFr Bld: 6.8 % — ABNORMAL HIGH (ref <5.7)
Mean Plasma Glucose: 148 mg/dL

## 2015-10-18 MED ORDER — GLUCOSE BLOOD VI STRP: 1.0000 | ORAL_STRIP | Freq: Three times a day (TID) | 12 refills | Status: DC

## 2015-10-18 NOTE — Progress Notes (Addendum)
Patient ID: Laura Best MRN: [id number], DOB: January 04, 1941, 75 y.o. Date of Encounter: @DATE @  Chief Complaint:  Chief Complaint  Patient presents with  . Follow-up    HPI: 75 y.o. year old white female  presents for f/u OV.   I had been seeing her routinely in the past until March 2012. After that visit March 2012, I did not see her until 06/13/12.  At that time, she told me that even though she had insurance, she did not have the money to pay her co-pay so she had not come in for any office visits during that time.  She says that she only has to pay a $15 co-pay when she comes here. She says that her finances are better now. Says that she has paid off her son's truck and she no longer has that payment going out  every month.  When she came 06/13/12 she reported that she had  been continuing to use Lantus 15 units every morning through that entire period of time. She also reported that she had been seeing [facility] and they were managing her blood pressure and cholesterol during that time.  She was hospitalized 07/19/12 through 07/25/12. She had appendiceal abscess and underwent surgery by Dr. Hulen Skains.   AT OV 02/25/2014: Prior to the visit 02/25/14 Levemir was at 18 units every morning. At that visit I had her increase to Levemir 22 units every morning.  She called me the following Monday morning to report that sugar readings on that dose. She did this. Her readings looked good. Told her to continue that current dose of Levemir to 22 units. Told her to continue to check blood sugar fasting every morning but also to check some readings 2 hours postprandial. To have schedule follow-up office visit.  She had follow-up office visit 03/12/14. AT OV 03/12/14: She did bring a blood sugar log sheet. All of those readings are documented in the office note 03/12/14. Review of those readings showed that her bedtime readings were the worst and were all in the 300-400 range. At that visit I told her  to continue the Levemir at 22 units. Told her to add short acting insulin at suppertime/dinnertime 5 units.  OV--06/25/2014:  Today she reports that she is still doing the Levemir 22 units.  She reports that she is doing the short acting insulin 5 units at suppertime. She did bring in blood sugar log sheet. For the month of May she has checked fasting blood sugar every morning. These are all excellent and very consistent. They all range from 102-117. She has 3 readings before lunch----260, 218, 216 She has about 10 readings for 2 hours after lunch--- 258, 301, 218, 191, 292, 198, 203, 372, 316 2 readings done before dinner are 240 and 260 She has 5 readings done 2 hours after dinner----252, 280, 190, 203, 191 She has checked bedtime readings every night----there are only 4 readings less than 200 for the entire month of May----184, 190, 186, 171 ------------ all other bedtime readings are in the 200s. Only one reading above 300 and that was one reading of 323. At that OV I told her to: Continue Levemir 22 units daily. Continue mealtime insulin 5 units at dinner/supper. Add mealtime insulin 5 units at lunch Call me if starts getting any readings less than 80  AT OV 10/05/2014: She reports that she is taking all of the insulins as listed above. She did add the 5 units at lunchtime. She brings  in blood sugar log readings with her to visit today. Most every day she has checked her sugar 3 or 4 times per day. Occasionally only 2 readings per day. Every single morning she has a fasting sugar documented. Recent fasting readings have all been very good ranging from 83-112. No low readings. Readings 2 hours after breakfast have ranged from 118-211. Readings before lunch have ranged 129-211 Readings before dinner have mostly been 148 and 158 but occasionally have been higher at 226. Readings 2 hours after dinner--she has a couple of readings around 122. Most readings are in the 150-160 range. Some are  higher in the 200-268 range. Most of the bedtime readings are around 200. ----However really cannot get these bedtime readings much lower without causing hypoglycemia by morning.  At visit 10/05/14 I checked a microalbumin which came back elevated at 15.0. Also CMET showed GFR 32 with BUN/creatinine of 32 and 1.60. At that time, I ordered referral to nephrology.  Today patient states that she did see Dr. Hinda Lenis,  Nephrologist. She says that she saw him November 4 and he did lab, urinalysis and an ultrasound. Says that she had follow-up with him November 29 and that the only medicine he has changed with her vitamin D. Says that there is been no other changes in medications. Says that at that visit she was told to follow-up 4 months.  At [place] 01/04/2015: She does bring blood sugar log sheet. Fasting readings: are mostly 105-117. There is a reading at 91 and a reading of 137. 2 hours after lunch: those readings range 209-264 Before dinner: For the month of December,  she just has a few readings and these are 175, 219, 158 Bedtime readings are 211, 238, 262, 161  She states that she is taking all blood pressure medications as directed with no lightheadedness, no lower extremity edema. She did not bring a blood pressure log form today but says that she does still check that occasionally but is always getting good readings on that.  She is taking cholesterol medication as directed with no myalgias or other adverse effects.  At [place] 04/05/2015:  She brought blood sugar log sheets with her. She has been checking blood sugar 3 times a day almost every day. For the month of March fasting sugars have been 106, 88, 100, 120, 102, 75, 82, 100, 116, 129, 107, 145, 105 ---------------------------------- nighttime readings have been 223, 188, 199, 176, 268, 240, 228, 250, 196, 289  I told her that her blood pressure is reading a little high today. She did not bring log sheet with her blood pressure readings  but says that she checks her blood pressure 3 times a day. Says that "sometimes it will go up like that but then it comes back down". Says that overall blood pressure readings have been good.  At [place] 07/14/2015:  She brings BS log sheets from past 6 months.  Recent readings good.  Fasting AM readings: 115, 110, 132, 98, 139.  HS readings: 153, 133, 260, 200, 196, 159  Says she saw Cardiologist this week--told her can wait 1 year for f/u. Says she saw Dr. Hinda Lenis couple weeks ago. To f/u September. Had labs with [facility] with him.  Asked if had gotten to see eye doctor yet--"Not yet, honey"-- will see if sister-in-law or son can take her.   Still checking her BP also and getting good readings on that too.   She states that she is taking all blood pressure medications  as directed with no lightheadedness, no lower extremity edema.  She is taking cholesterol medication as directed with no myalgias or other adverse effects.  She does report that she "has been having some heartburn recently and is wondering what she can take for that."  She has no other complaints or concerns today. Has been feeling well.  ADDENDUM ADDED 09/01/2015: Received copy of office notes and labs performed by Nephrology Clinic---Dr. Ulice Bold --At office visit he added low dose ACE-- 2.5 mg daily. Patient had follow-up BMET 2 weeks later and creatinine had actually decreased from 1.70 down to 1.58. ACE inhibitor continued. All other labs in aspect of his care were stable and no other medications added or changed. Planned to have her follow-up in 3 months.  10/18/2015: States that she has been feeling fine and has no complaints today. However she says that she has an appointment to see her GI doctor on Wednesday 10/20/15.  Says that she recently did labs for her kidney doctor and that he was concerned about some of the lab findings and said that she needed to follow-up with her GI doctor and has this scheduled for  Wednesday. Otherwise she says that things have been okay and she says that she feels just fine. States that she continues to keep her log of her blood pressure and blood sugar readings etc. and is still getting good readings on everything. No complaints or concerns today.  ADDENDUM ADDED 10/21/2015: Received note from GI--Dr. Ashley Royalty dated 10/20/2015.  "Her Hgb dropped from 13 (04/2015) to 10 (09/2015). " "No reports of hematochezia or melena." "There is significant drop in her hemoglobin over the past 5 months but she denies any overt evidence of bleeding.  I feel that this anemia is from her deteriorating renal function, however, it is prudent to reevaluate her colon and to perform an EGD."   Past Medical History:  Diagnosis Date  . CKD (chronic kidney disease) stage 3, GFR 30-59 ml/min 12/25/2012  . Diabetes mellitus   . Diastolic heart failure ([place])   . Hyperlipidemia   . Hypertension   . Osteoporosis   . Osteoporosis   . Tricuspid regurgitation    Echo 05/02/06-Nml LV. Mild MR. Mod TR  . Vitamin D deficiency      Home Meds:  Outpatient Medications Prior to Visit  Medication Sig Dispense Refill  . acetaminophen (TYLENOL) 500 MG tablet Take 500 mg by mouth every 6 (six) hours as needed for pain.    Marland Kitchen amLODipine (NORVASC) 10 MG tablet Take 1 tablet (10 mg total) by mouth daily. 90 tablet 3  . aspirin EC 81 MG tablet Take 81 mg by mouth daily.    . Cholecalciferol (VITAMIN D) 2000 UNITS CAPS Take 1 capsule by mouth daily.    . cloNIDine (CATAPRES) 0.2 MG tablet TAKE ONE TABLET BY MOUTH TWICE DAILY 60 tablet 3  . furosemide (LASIX) 40 MG tablet TAKE ONE TABLET BY MOUTH ONCE DAILY 90 tablet 3  . gabapentin (NEURONTIN) 100 MG capsule Take 1-2 capsules at bedtime for neuropathy 60 capsule 3  . insulin aspart (NOVOLOG) 100 UNIT/ML FlexPen Take 9 units with luch and supper.  Inject 15 minutes prior to meal or immediately after meal. 15 mL 11  . LEVEMIR FLEXTOUCH 100 UNIT/ML Pen INJECT 22  UNITS SUBCUTANEOUSLY ONCE DAILY AT  10  PM 15 mL 2  . lisinopril (PRINIVIL,ZESTRIL) 2.5 MG tablet Take 2.5 mg by mouth daily.     Marland Kitchen omeprazole (PRILOSEC) 20  MG capsule TAKE ONE CAPSULE BY MOUTH ONCE DAILY 90 capsule 3  . RELION SHORT PEN NEEDLES 31G X 8 MM MISC USE ONE PEN NEEDLE ONCE DAILY AS DIRECTED 50 each 11  . simvastatin (ZOCOR) 20 MG tablet TAKE ONE TABLET BY MOUTH ONCE DAILY AT BEDTIME 90 tablet 3   No facility-administered medications prior to visit.      Allergies:  Allergies  Allergen Reactions  . Ace Inhibitors Other (See Comments)    Hyperkalemia  . Actos [Pioglitazone] Swelling  . Aspirin Other (See Comments)    G.I. Upset.   . Metformin And Related Diarrhea    Social History   Social History  . Marital status: Widowed    Spouse name: N/A  . Number of children: N/A  . Years of education: N/A   Occupational History  . Not on file.   Social History Main Topics  . Smoking status: Former Smoker    Quit date: 01/19/2009  . Smokeless tobacco: Never Used  . Alcohol use No  . Drug use: No  . Sexual activity: Not on file   Other Topics Concern  . Not on file   Social History Narrative   Entered 10/2013:   She has never driven.   She lives with her son.    Family History  Problem Relation Age of Onset  . Cancer Father   . Diabetes Father   . Cancer Brother     Brain  . Cancer Sister     abdominal fat     Review of Systems:  See HPI for pertinent ROS. All other ROS negative.    Physical Exam: Blood pressure (!) 118/58, pulse (!) 45, temperature 97.9 F (36.6 C), resp. rate 14, weight 172 lb (78 kg)., Body mass index is 30.47 kg/m. General: WNWD WF. Appears in no acute distress. Neck: Supple. No thyromegaly. No lymphadenopathy.No carotid bruits. Lungs: Clear bilaterally to auscultation without wheezes, rales, or rhonchi. Breathing is unlabored. Heart: RRR with S1 S2. No murmurs, rubs, or gallops. Abdomen: Soft, non-tender, non-distended with  normoactive bowel sounds. No hepatomegaly. No rebound/guarding. No obvious abdominal masses. Musculoskeletal:  Strength and tone normal for age. Extremities/Skin: Warm and dry. No clubbing or cyanosis. No edema. No rashes or suspicious lesions. Neuro: Alert and oriented X 3. Moves all extremities spontaneously. Gait is normal. CNII-XII grossly in tact. Psych:  Responds to questions appropriately with a normal affect. Diabetic foot exam:  Inspection is normal. She does have bunions present bilaterally. O/W, remainder of inspection normal.  No open wounds or nonhealing sores. Sensation testing is normal. There are no palpable dorsalis pedis or posterior tibial pulses bilaterally.      ASSESSMENT AND PLAN:  75 y.o. year old female with  1. Diabetes type 2, uncontrolled   Continue Levemir 22 units daily.----------------------------------------------------------------------------------At OV 07/14/2015---Verified--she IS using this dose AT OV 01/04/2015: Increase mealtime insulin from 5 units to 7 units at lunch-------------At OV 07/14/2015-----verified--she is using 7 units at lunch AT OV 01/04/2015: Increase mealtime insulin from 5 units to 7 units at dinner/supper.---A tOv 07/14/2015---verified---she is using 7 untis at supper Call me if starts getting any readings less than 80  --MicroAlbumin---10/05/2014 --HgbA1C ACE inhibitor therapy had been d/ced--secondary to h/o Hyperkalemia with this.  Also on no ARB sec to this. Meds changed during past hospitalization.  ----09/01/2015--Addendum--Low Dose ACE Inh added by Renal--------------------------  She cannot take metformin secondary to past GI adverse effects and also now she has chronic kidney disease which prohibits use of  this.   At [place] 12/14 I discussed with her that she needs to have an annual diabetic eye exam.  At [place] 02/11/14-- she says she has this scheduled for Next Tuesday---"My son got that morning off work to take me that day."  However,  at [place] 06/25/14--she says he wasnot able to get off work to take her--she did NOT have eye exam.  Discussed again at office visit 10/05/14----she says that she still has not been able to have eye exam. Discussed again at [place] 07/14/2015---says she will see if sister-in-law or son can take her She is aware this is recommended.  At prior OV  I discussed proper foot care. Discussed the effects of the diabetes has on her blood vessels as bar as blood flow to her feet. Also discussed the effects it has on peripheral nerves. Discussed that she may have decreased sensation and not feel pain and sores as a normal person would. Therefore needs to wear shoes and be very protective of her feet. Also discussed proper care regarding trimming her toenails etc. If she ever does see any small sores developing that she needs to come in for evaluation immediately. Diabetic foot exam--documented in quality metrics 10/05/2014  Discussed need for proper exercise. She states she gets outside and walks whenever possible. She definitely does this when the weather is nice and says even if it is somewhat cold she still tries to get out of remain active.  We have discussed carbohydrate diet in the past that have been a long time. I reviewed a low carbohydrate diet handout with her and discussed details of specific examples of things to avoid as well as gave her examples of meals that she can eat.  Glaucoma Screening: At [facility] 10/30/13-- I did referral to ophthalmology for diabetic eye exam and glaucoma screening. At her visit on 02/11/14 she reported that she has appointment with the eye doctor "next Tuesday and so her son is going to take off that morning from work to take her to that appointment" However, at [place] 06/25/14--she says he wasnot able to get off work to take her--she did NOT have eye exam.  She is aware this is recommended.   2. Hypertension  At prior visit with me I reviewed office note by cardiology dated 06/2012. They  had performed a renal artery ultrasound which was negative. In their notes they documented that they reviewed the fact that she has labile blood pressure readings.  They also felt the patient had white coat hypertension.  At [place] 03/2015--BP high but she says she checks BP 3 times a day and sometimes BP will go up, then will come down and that, on average, BP good.  She was on ACE Inhibitor, until hyperkalemia prohibited this.  At [place] 12/2012 I noted and did: Her heart rate is low so I do not think I should add a beta blocker. Bystolic is going to be expensive. i will Increase her clonidine. She will stop using the patch and switch over to pill form. - cloNIDine (CATAPRES) 0.3 MG tablet; Take 1 tablet (0.3 mg total) by mouth 2 (two) times daily.  Dispense: 60 tablet; Refill: 5  Blood pressure is now stable/controlled. Patient checks it frequently at home and is getting good readings. Continue current medications with no change.  3. CKD (chronic kidney disease) stage 3, GFR 30-59 ml/min In past, we have discussed need to keep BP and BS well controlled.  ---------------She is now seeing Dr. Hinda Lenis, Renal. -------------------  ADDENDUM ADDED 09/01/2015: Received copy of office notes and labs performed by Nephrology Clinic---Dr. Ulice Bold --At office visit he added low dose ACE-- 2.5 mg daily. Patient had follow-up BMET 2 weeks later and creatinine had actually decreased from 1.70 down to 1.58. ACE inhibitor continued. All other labs in aspect of his care were stable and no other medications added or changed. Planned to have her follow-up in 3 months.    4. Hyperlipidemia On simvastatin  Had FLP/LFT recently  5. Osteoporosis  She has been on Fosamax in the past.  Will not do further bone density test as I would not do further treatment regardless of the test results.  6. Vitamin D deficiency Was on 4,000 IU QD---At OV 12/2014--she says that Dr. Hinda Lenis  adjusted Vit D dose.  7. Pulmonary hypertension Managed by Cardiology  8. COPD (chronic obstructive pulmonary disease) Quit smoking 4 years ago.  9. H/O Normocytic anemia  GERD --omeprazole 20mg  QD PRN  Screening for colorectal cancer She states that the last colonoscopy was less than 10 years ago. At that time she was told she can repeat 10 years. Not due yet. At [place] 06/25/14 she reports she recently saw her GI. Was told last colonoscopy 6 yrs ago--4 more years until next one due. Was told they would see her in 4 years, or sooner if needed.   Immunizations: Pneumonia vaccine: She received the Pneumovax 11/30/09.   Prevnar 13 --given here 12/2012.  Tetanus:  We gave here 12/2012. Zostavax: Told her to contact her insurance company to find out her cost for this. If she wishes to proceed , she can call us and we can send an order in to the pharmacy for her to go there for the injection. Influenza vaccine: Was given here 10/05/2014. Given here 10/18/2015    SHE HAD CPE 10/30/2013. THE FOLLOWING IS COPIED FROM THAT NOTE:  Visit for preventive health examination  A. Screening Labs: She has had recent CBC, CMET. Her last lipid panel was 07/04/13 which showed triglycerides 70, HDL 58, LDL 67  B. GXQ:JJHERDEY--CXK 73.  C. Screening Mammogram: Last mammogram was 02/25/2013  D. DEXA/BMD: She has been on Fosamax in the past.  Will not do further bone density test as I would not do further treatment regardless of the test results. E. Colorectal Cancer Screening:  Last colonoscopy was by Dr. Benson Norway 08/13/2008 F. Immunizations:  Influenza: 09/2013, 10/05/2014 Tetanus: 12/2012 Pneumococcal: She received the Pneumovax 11/30/09. Prevnar 13 --given here 12/2012.  Zostavax: she defers.  ROV 3 months, sooner if needed.   Signed, [address] [place], [place], [facility] 10/18/2015 10:53 AM

## 2015-10-18 NOTE — Addendum Note (Signed)
Addended by: Vonna Kotyk A on: 10/18/2015 04:34 PM   Modules accepted: Orders

## 2015-10-20 DIAGNOSIS — R1032 Left lower quadrant pain: Secondary | ICD-10-CM | POA: Diagnosis not present

## 2015-10-20 DIAGNOSIS — R1012 Left upper quadrant pain: Secondary | ICD-10-CM | POA: Diagnosis not present

## 2015-10-20 DIAGNOSIS — D500 Iron deficiency anemia secondary to blood loss (chronic): Secondary | ICD-10-CM | POA: Diagnosis not present

## 2015-10-20 DIAGNOSIS — K5900 Constipation, unspecified: Secondary | ICD-10-CM | POA: Diagnosis not present

## 2015-10-23 DIAGNOSIS — E1165 Type 2 diabetes mellitus with hyperglycemia: Secondary | ICD-10-CM | POA: Diagnosis not present

## 2015-11-01 DIAGNOSIS — R69 Illness, unspecified: Secondary | ICD-10-CM | POA: Diagnosis not present

## 2015-11-10 ENCOUNTER — Other Ambulatory Visit: Payer: Self-pay | Admitting: Adult Health

## 2015-11-10 ENCOUNTER — Other Ambulatory Visit: Payer: Self-pay | Admitting: Physician Assistant

## 2015-11-10 DIAGNOSIS — R69 Illness, unspecified: Secondary | ICD-10-CM | POA: Diagnosis not present

## 2015-11-10 NOTE — Telephone Encounter (Signed)
RX refilled per protocol 

## 2015-12-27 DIAGNOSIS — R69 Illness, unspecified: Secondary | ICD-10-CM | POA: Diagnosis not present

## 2016-01-11 DIAGNOSIS — R69 Illness, unspecified: Secondary | ICD-10-CM | POA: Diagnosis not present

## 2016-01-12 ENCOUNTER — Other Ambulatory Visit: Payer: Self-pay

## 2016-01-13 MED ORDER — INSULIN ASPART 100 UNIT/ML FLEXPEN: PEN_INJECTOR | SUBCUTANEOUS | 11 refills | Status: DC

## 2016-01-13 NOTE — Telephone Encounter (Signed)
Rx filled per protocol  

## 2016-01-20 ENCOUNTER — Ambulatory Visit (INDEPENDENT_AMBULATORY_CARE_PROVIDER_SITE_OTHER): Payer: Medicare HMO | Admitting: Physician Assistant

## 2016-01-20 ENCOUNTER — Encounter: Payer: Self-pay | Admitting: Physician Assistant

## 2016-01-20 VITALS — BP 138/74 | HR 58 | Temp 98.0°F | Resp 18 | Wt 169.0 lb

## 2016-01-20 DIAGNOSIS — E785 Hyperlipidemia, unspecified: Secondary | ICD-10-CM | POA: Diagnosis not present

## 2016-01-20 DIAGNOSIS — N183 Chronic kidney disease, stage 3 unspecified: Secondary | ICD-10-CM

## 2016-01-20 DIAGNOSIS — R109 Unspecified abdominal pain: Secondary | ICD-10-CM

## 2016-01-20 DIAGNOSIS — I10 Essential (primary) hypertension: Secondary | ICD-10-CM

## 2016-01-20 DIAGNOSIS — Z794 Long term (current) use of insulin: Secondary | ICD-10-CM

## 2016-01-20 DIAGNOSIS — E1121 Type 2 diabetes mellitus with diabetic nephropathy: Secondary | ICD-10-CM

## 2016-01-20 DIAGNOSIS — R1900 Intra-abdominal and pelvic swelling, mass and lump, unspecified site: Secondary | ICD-10-CM | POA: Diagnosis not present

## 2016-01-20 DIAGNOSIS — D649 Anemia, unspecified: Secondary | ICD-10-CM | POA: Diagnosis not present

## 2016-01-20 LAB — COMPLETE METABOLIC PANEL WITH GFR
ALT: 14 U/L (ref 6–29)
AST: 16 U/L (ref 10–35)
Albumin: 4 g/dL (ref 3.6–5.1)
Alkaline Phosphatase: 81 U/L (ref 33–130)
BUN: 32 mg/dL — ABNORMAL HIGH (ref 7–25)
CHLORIDE: 104 mmol/L (ref 98–110)
CO2: 28 mmol/L (ref 20–31)
CREATININE: 1.54 mg/dL — AB (ref 0.60–0.93)
Calcium: 9.3 mg/dL (ref 8.6–10.4)
GFR, EST AFRICAN AMERICAN: 38 mL/min — AB (ref >=60)
GFR, Est Non African American: 33 mL/min — ABNORMAL LOW (ref >=60)
Glucose, Bld: 147 mg/dL — ABNORMAL HIGH (ref 70–99)
Potassium: 4.6 mmol/L (ref 3.5–5.3)
Sodium: 140 mmol/L (ref 135–146)
Total Bilirubin: 0.4 mg/dL (ref 0.2–1.2)
Total Protein: 7.1 g/dL (ref 6.1–8.1)

## 2016-01-20 LAB — CBC
HCT: 37.1 % (ref 35.0–45.0)
Hemoglobin: 12.3 g/dL (ref 12.0–15.0)
MCH: 29 pg (ref 27.0–33.0)
MCHC: 33.2 g/dL (ref 32.0–36.0)
MCV: 87.5 fL (ref 80.0–100.0)
PLATELETS: 194 10*3/uL (ref 140–400)
RBC: 4.24 MIL/uL (ref 3.80–5.10)
RDW: 14 % (ref 11.0–15.0)
WBC: 6.6 10*3/uL (ref 3.8–10.8)

## 2016-01-20 LAB — LIPID PANEL
CHOL/HDL RATIO: 2.6 ratio (ref <5.0)
CHOLESTEROL: 159 mg/dL (ref <200)
HDL: 61 mg/dL (ref >50)
LDL Cholesterol: 72 mg/dL (ref <100)
Triglycerides: 132 mg/dL (ref <150)
VLDL: 26 mg/dL (ref <30)

## 2016-01-20 NOTE — Progress Notes (Signed)
Patient ID: Laura Best MRN: [id number], DOB: 24-Jun-1940, 75 y.o. Date of Encounter: @DATE @  Chief Complaint:  Chief Complaint  Patient presents with  . Hypertension  . Diabetes    HPI: 75 y.o. year old white female  presents for f/u OV.   I had been seeing her routinely in the past until March 2012. After that visit March 2012, I did not see her until 06/13/12.  At that time, she told me that even though she had insurance, she did not have the money to pay her co-pay so she had not come in for any office visits during that time.  She says that she only has to pay a $15 co-pay when she comes here. She says that her finances are better now. Says that she has paid off her son's truck and she no longer has that payment going out  every month.  When she came 06/13/12 she reported that she had  been continuing to use Lantus 15 units every morning through that entire period of time. She also reported that she had been seeing [facility] and they were managing her blood pressure and cholesterol during that time.  She was hospitalized 07/19/12 through 07/25/12. She had appendiceal abscess and underwent surgery by Dr. Hulen Skains.   AT OV 02/25/2014: Prior to the visit 02/25/14 Levemir was at 18 units every morning. At that visit I had her increase to Levemir 22 units every morning.  She called me the following Monday morning to report that sugar readings on that dose. She did this. Her readings looked good. Told her to continue that current dose of Levemir to 22 units. Told her to continue to check blood sugar fasting every morning but also to check some readings 2 hours postprandial. To have schedule follow-up office visit.  She had follow-up office visit 03/12/14. AT OV 03/12/14: She did bring a blood sugar log sheet. All of those readings are documented in the office note 03/12/14. Review of those readings showed that her bedtime readings were the worst and were all in the 300-400 range. At that  visit I told her to continue the Levemir at 22 units. Told her to add short acting insulin at suppertime/dinnertime 5 units.  OV--06/25/2014:  Today she reports that she is still doing the Levemir 22 units.  She reports that she is doing the short acting insulin 5 units at suppertime. She did bring in blood sugar log sheet. For the month of May she has checked fasting blood sugar every morning. These are all excellent and very consistent. They all range from 102-117. She has 3 readings before lunch----260, 218, 216 She has about 10 readings for 2 hours after lunch--- 258, 301, 218, 191, 292, 198, 203, 372, 316 2 readings done before dinner are 240 and 260 She has 5 readings done 2 hours after dinner----252, 280, 190, 203, 191 She has checked bedtime readings every night----there are only 4 readings less than 200 for the entire month of May----184, 190, 186, 171 ------------ all other bedtime readings are in the 200s. Only one reading above 300 and that was one reading of 323. At that OV I told her to: Continue Levemir 22 units daily. Continue mealtime insulin 5 units at dinner/supper. Add mealtime insulin 5 units at lunch Call me if starts getting any readings less than 80  AT OV 10/05/2014: She reports that she is taking all of the insulins as listed above. She did add the 5 units at  lunchtime. She brings in blood sugar log readings with her to visit today. Most every day she has checked her sugar 3 or 4 times per day. Occasionally only 2 readings per day. Every single morning she has a fasting sugar documented. Recent fasting readings have all been very good ranging from 83-112. No low readings. Readings 2 hours after breakfast have ranged from 118-211. Readings before lunch have ranged 129-211 Readings before dinner have mostly been 148 and 158 but occasionally have been higher at 226. Readings 2 hours after dinner--she has a couple of readings around 122. Most readings are in the 150-160  range. Some are higher in the 200-268 range. Most of the bedtime readings are around 200. ----However really cannot get these bedtime readings much lower without causing hypoglycemia by morning.  At visit 10/05/14 I checked a microalbumin which came back elevated at 15.0. Also CMET showed GFR 32 with BUN/creatinine of 32 and 1.60. At that time, I ordered referral to nephrology.  Today patient states that she did see Dr. Hinda Lenis,  Nephrologist. She says that she saw him November 4 and he did lab, urinalysis and an ultrasound. Says that she had follow-up with him November 29 and that the only medicine he has changed with her vitamin D. Says that there is been no other changes in medications. Says that at that visit she was told to follow-up 4 months.  At [place] 01/04/2015: She does bring blood sugar log sheet. Fasting readings: are mostly 105-117. There is a reading at 91 and a reading of 137. 2 hours after lunch: those readings range 209-264 Before dinner: For the month of December,  she just has a few readings and these are 175, 219, 158 Bedtime readings are 211, 238, 262, 161  She states that she is taking all blood pressure medications as directed with no lightheadedness, no lower extremity edema. She did not bring a blood pressure log form today but says that she does still check that occasionally but is always getting good readings on that.  She is taking cholesterol medication as directed with no myalgias or other adverse effects.  At [place] 04/05/2015:  She brought blood sugar log sheets with her. She has been checking blood sugar 3 times a day almost every day. For the month of March fasting sugars have been 106, 88, 100, 120, 102, 75, 82, 100, 116, 129, 107, 145, 105 ---------------------------------- nighttime readings have been 223, 188, 199, 176, 268, 240, 228, 250, 196, 289  I told her that her blood pressure is reading a little high today. She did not bring log sheet with her blood  pressure readings but says that she checks her blood pressure 3 times a day. Says that "sometimes it will go up like that but then it comes back down". Says that overall blood pressure readings have been good.  At [place] 07/14/2015:  She brings BS log sheets from past 6 months.  Recent readings good.  Fasting AM readings: 115, 110, 132, 98, 139.  HS readings: 153, 133, 260, 200, 196, 159  Says she saw Cardiologist this week--told her can wait 1 year for f/u. Says she saw Dr. Hinda Lenis couple weeks ago. To f/u September. Had labs with [facility] with him.  Asked if had gotten to see eye doctor yet--"Not yet, honey"-- will see if sister-in-law or son can take her.   Still checking her BP also and getting good readings on that too.   She states that she is taking all  blood pressure medications as directed with no lightheadedness, no lower extremity edema.  She is taking cholesterol medication as directed with no myalgias or other adverse effects.  She does report that she "has been having some heartburn recently and is wondering what she can take for that."  She has no other complaints or concerns today. Has been feeling well.  ADDENDUM ADDED 09/01/2015: Received copy of office notes and labs performed by Nephrology Clinic---Dr. Ulice Bold --At office visit he added low dose ACE-- 2.5 mg daily. Patient had follow-up BMET 2 weeks later and creatinine had actually decreased from 1.70 down to 1.58. ACE inhibitor continued. All other labs in aspect of his care were stable and no other medications added or changed. Planned to have her follow-up in 3 months.  10/18/2015: States that she has been feeling fine and has no complaints today. However she says that she has an appointment to see her GI doctor on Wednesday 10/20/15.  Says that she recently did labs for her kidney doctor and that he was concerned about some of the lab findings and said that she needed to follow-up with her GI doctor and has  this scheduled for Wednesday. Otherwise she says that things have been okay and she says that she feels just fine. States that she continues to keep her log of her blood pressure and blood sugar readings etc. and is still getting good readings on everything. No complaints or concerns today.  ADDENDUM ADDED 10/21/2015: Received note from GI--Dr. Ashley Royalty dated 10/20/2015.  "Her Hgb dropped from 13 (04/2015) to 10 (09/2015). " "No reports of hematochezia or melena." "There is significant drop in her hemoglobin over the past 5 months but she denies any overt evidence of bleeding.  I feel that this anemia is from her deteriorating renal function, however, it is prudent to reevaluate her colon and to perform an EGD."   01/20/2016: Today she reports that she is concerned because her abdomen is getting bigger and bigger. Says that she has been having to get bigger size pants. Has been noticing this over the past about 2 months. Has been feeling discomfort on the left side of her abdomen. Feels like the left side of her abdomen is protruding out further than the right side. Says that she feels like she can't even get a full deep breath because of this abdominal distention.  She also reports that she saw Dr. Benson Norway for GI. Says that he did labs and scheduled her to do a colonoscopy. She "did the colonoscopy prep etc. and then got there and they told her it was going to be $340 because it had not been 10 years" and so she left without having the procedure done and has had no follow-up from Dr. Ulyses Amor office since then. Says that she never even heard about the lab results done there.  She has no other specific complaints or concerns today.  She brings in her blood sugar log forms from the last 3 months. Readings look excellent. She has been checking blood sugar 2 times every single day. Most are fasting morning and bedtime but then occasionally she does check before lunch and before dinner. For the month of  December fasting readings range from 88-139.  Bedtime readings range from 107 - 189.Marland Kitchen  Before lunch 174, 196, 188, 150, 142. Before dinner 127, 179, 146, 143, 191  She is taking blood pressure medications as directed. No lightheadedness or other adverse effects. She is taking simvastatin as directed. No  myalgias or other adverse effects.  Past Medical History:  Diagnosis Date  . CKD (chronic kidney disease) stage 3, GFR 30-59 ml/min 12/25/2012  . Diabetes mellitus   . Diastolic heart failure ([place])   . Hyperlipidemia   . Hypertension   . Osteoporosis   . Osteoporosis   . Tricuspid regurgitation    Echo 05/02/06-Nml LV. Mild MR. Mod TR  . Vitamin D deficiency      Home Meds:  Outpatient Medications Prior to Visit  Medication Sig Dispense Refill  . acetaminophen (TYLENOL) 500 MG tablet Take 500 mg by mouth every 6 (six) hours as needed for pain.    Marland Kitchen amLODipine (NORVASC) 10 MG tablet Take 1 tablet (10 mg total) by mouth daily. 90 tablet 3  . aspirin EC 81 MG tablet Take 81 mg by mouth daily.    . Cholecalciferol (VITAMIN D) 2000 UNITS CAPS Take 1 capsule by mouth daily.    . cloNIDine (CATAPRES) 0.2 MG tablet TAKE ONE TABLET BY MOUTH TWICE DAILY 60 tablet 3  . furosemide (LASIX) 40 MG tablet TAKE ONE TABLET BY MOUTH ONCE DAILY 90 tablet 3  . gabapentin (NEURONTIN) 100 MG capsule Take 1-2 capsules at bedtime for neuropathy 60 capsule 3  . glucose blood test strip 1 each by Other route 4 (four) times daily -  before meals and at bedtime. Use as instructed 150 each 12  . insulin aspart (NOVOLOG) 100 UNIT/ML FlexPen Take 7 units with luch and supper.  Inject 15 minutes prior to meal or immediately after meal. 15 mL 11  . LEVEMIR FLEXTOUCH 100 UNIT/ML Pen INJECT 22 UNITS SUBCUTANEOUSLY ONCE DAILY AT  10  PM 7 pen 2  . lisinopril (PRINIVIL,ZESTRIL) 2.5 MG tablet Take 2.5 mg by mouth daily.     Marland Kitchen omeprazole (PRILOSEC) 20 MG capsule TAKE ONE CAPSULE BY MOUTH ONCE DAILY 90 capsule 3  . RELION  SHORT PEN NEEDLES 31G X 8 MM MISC USE ONE PEN NEEDLE ONCE DAILY AS DIRECTED 50 each 11  . simvastatin (ZOCOR) 20 MG tablet TAKE ONE TABLET BY MOUTH ONCE DAILY AT BEDTIME 90 tablet 3   No facility-administered medications prior to visit.      Allergies:  Allergies  Allergen Reactions  . Ace Inhibitors Other (See Comments)    Hyperkalemia  . Actos [Pioglitazone] Swelling  . Aspirin Other (See Comments)    G.I. Upset.   . Metformin And Related Diarrhea    Social History   Social History  . Marital status: Widowed    Spouse name: N/A  . Number of children: N/A  . Years of education: N/A   Occupational History  . Not on file.   Social History Main Topics  . Smoking status: Former Smoker    Quit date: 01/19/2009  . Smokeless tobacco: Never Used  . Alcohol use No  . Drug use: No  . Sexual activity: Not on file   Other Topics Concern  . Not on file   Social History Narrative   Entered 10/2013:   She has never driven.   She lives with her son.    Family History  Problem Relation Age of Onset  . Cancer Father   . Diabetes Father   . Cancer Brother     Brain  . Cancer Sister     abdominal fat     Review of Systems:  See HPI for pertinent ROS. All other ROS negative.    Physical Exam: Blood pressure 138/74, pulse (!) 58,  temperature 98 F (36.7 C), temperature source Oral, resp. rate 18, weight 169 lb (76.7 kg), SpO2 98 %., Body mass index is 29.94 kg/m. General: WNWD WF. Appears in no acute distress. Neck: Supple. No thyromegaly. No lymphadenopathy.No carotid bruits. Lungs: Clear bilaterally to auscultation without wheezes, rales, or rhonchi. Breathing is unlabored. Heart: RRR with S1 S2. No murmurs, rubs, or gallops. Abdomen: Abdomen is very distended. She reports no focal area of tenderness with palpation but in general feels some discomfort on the left side of the abdomen and feels that the left side of the abdomen is more distended than the  right. Musculoskeletal:  Strength and tone normal for age. Extremities/Skin: Warm and dry.  No edema.  Neuro: Alert and oriented X 3. Moves all extremities spontaneously. Gait is normal. CNII-XII grossly in tact. Psych:  Responds to questions appropriately with a normal affect. Diabetic foot exam:  Inspection is normal. She does have bunions present bilaterally. O/W, remainder of inspection normal.  No open wounds or nonhealing sores. Sensation testing is normal. There are no palpable dorsalis pedis or posterior tibial pulses bilaterally.    Results for orders placed or performed in visit on 01/20/16  CBC  Result Value Ref Range   WBC 6.6 3.8 - 10.8 K/uL   RBC 4.24 3.80 - 5.10 MIL/uL   Hemoglobin 12.3 12.0 - 15.0 g/dL   HCT 37.1 35.0 - 45.0 %   MCV 87.5 80.0 - 100.0 fL   MCH 29.0 27.0 - 33.0 pg   MCHC 33.2 32.0 - 36.0 g/dL   RDW 14.0 11.0 - 15.0 %   Platelets 194 140 - 400 K/uL     ASSESSMENT AND PLAN:  75 y.o. year old female with   Abdominal pain, unspecified abdominal location 01/20/2016: I am concerned that she never had follow-up after her drop in hemoglobin. We'll recheck CBC here in the office stat review that result with her before she leaves.  H/H are actually now normal. Discussed these results with patient.  Given her abdominal pain and abdominal distention, will obtain CT.  - CBC  - CT Abdomen Pelvis W Contrast; Future  Abdominal mass, unspecified abdominal location 01/20/2016: I am concerned that she never had follow-up after her drop in hemoglobin. We'll recheck CBC here in the office stat review that result with her before she leaves.  H/H are actually now normal. Discussed these results with patient.  Given her abdominal pain and abdominal distention, will obtain CT.  - CBC  - CT Abdomen Pelvis W Contrast; Future   Anemia, unspecified type 01/20/2016: I am concerned that she never had follow-up after her drop in hemoglobin. We'll recheck CBC here in the office  stat review that result with her before she leaves.  H/H are actually now normal. Discussed these results with patient.  CBC was run here in the office and results reviewed with patient. CBC is now stable.  - CBC  Type 2 diabetes mellitus with diabetic nephropathy, with long-term current use of insulin ([place]) 01/20/2016: Blood sugar log form looks excellent. Will plan to continue current insulin doses and medications. Check labs to monitor.  - COMPLETE METABOLIC PANEL WITH GFR  - Hemoglobin A1c  ACE inhibitor therapy had been d/ced--secondary to h/o Hyperkalemia with this.  Also on no ARB sec to this. Meds changed during past hospitalization.  ----09/01/2015--Addendum--Low Dose ACE Inh added by Renal--------------------------  She cannot take metformin secondary to past GI adverse effects and also now she has chronic kidney disease  which prohibits use of this.   At [place] 12/14 I discussed with her that she needs to have an annual diabetic eye exam.  At [place] 02/11/14-- she says she has this scheduled for Next Tuesday---"My son got that morning off work to take me that day."  However, at [place] 06/25/14--she says he wasnot able to get off work to take her--she did NOT have eye exam.  Discussed again at office visit 10/05/14----she says that she still has not been able to have eye exam. Discussed again at [place] 07/14/2015---says she will see if sister-in-law or son can take her She is aware this is recommended.  At prior visits and at each visit we have discussed low carbohydrate diet, exercise she walks when she can, proper foot care as well as above diabetic eye exam  CKD (chronic kidney disease) stage 3, GFR 30-59 ml/min 01/20/2016: Managed by Renal--Dr Hinda Lenis  - COMPLETE METABOLIC PANEL WITH GFR    Hypertension At prior visit with me I reviewed office note by cardiology dated 06/2012. They had performed a renal artery ultrasound which was negative. In their notes they documented that they reviewed the  fact that she has labile blood pressure readings.  They also felt the patient had white coat hypertension.  At [place] 03/2015--BP high but she says she checks BP 3 times a day and sometimes BP will go up, then will come down and that, on average, BP good.  In past ACE inhibitor then discontinued because of hyperkalemia. Renal now has her on very low dose 2.5 mg daily  01/20/2016:Blood pressure is now stable/controlled. Patient checks it frequently at home and is getting good readings. Continue current medications with no change.  3. CKD (chronic kidney disease) stage 3, GFR 30-59 ml/min In past, we have discussed need to keep BP and BS well controlled.  ---------------She is now seeing Dr. Hinda Lenis, Renal. -------------------  ADDENDUM ADDED 09/01/2015: Received copy of office notes and labs performed by Nephrology Clinic---Dr. Ulice Bold --At office visit he added low dose ACE-- 2.5 mg daily. Patient had follow-up BMET 2 weeks later and creatinine had actually decreased from 1.70 down to 1.58. ACE inhibitor continued. All other labs in aspect of his care were stable and no other medications added or changed. Planned to have her follow-up in 3 months.  Hyperlipidemia, unspecified hyperlipidemia type 01/20/2016: She is on simvastatin. Check labs to monitor.  - COMPLETE METABOLIC PANEL WITH GFR  - Lipid panel   Osteoporosis  She has been on Fosamax in the past. 12;/28/2017: Will not do further bone density test as I would not do further treatment regardless of the test results.  Vitamin D deficiency Was on 4,000 IU QD---At [place] 12/2014--she says that Dr. Hinda Lenis adjusted Vit D dose. 01/20/2016---Managed by Renal--Dr. Hinda Lenis  Pulmonary hypertension 01/20/2016: Managed by Cardiology  COPD (chronic obstructive pulmonary disease) Quit smoking 4 years ago.  H/O Normocytic anemia  GERD 01/20/2016--Stable/controlled with omeprazole 20mg  QD PRN  Screening for colorectal  cancer She states that the last colonoscopy was less than 10 years ago. At that time she was told she can repeat 10 years. Not due yet. At [place] 06/25/14 she reports she recently saw her GI. Was told last colonoscopy 6 yrs ago--4 more years until next one due. Was told they would see her in 4 years, or sooner if needed. ADDENDUM ADDED 10/21/2015: Received note from GI--Dr. Ashley Royalty dated 10/20/2015.  "Her Hgb dropped from 13 (04/2015) to 10 (09/2015). " "No reports of hematochezia  or melena." "There is significant drop in her hemoglobin over the past 5 months but she denies any overt evidence of bleeding.  I feel that this anemia is from her deteriorating renal function, however, it is prudent to reevaluate her colon and to perform an EGD."  01/20/2016: She reports that she saw Dr. Benson Norway for GI. Says that he did labs and scheduled her to do a colonoscopy. She "did the colonoscopy prep etc. and then got there and they told her it was going to be $340 because it had not been 10 years" and so she left without having the procedure done and has had no follow-up from Dr. Ulyses Amor office since then. Says that she never even heard about the lab results done there.   Immunizations: Pneumonia vaccine: She received the Pneumovax 11/30/09.   Prevnar 13 --given here 12/2012.  Tetanus:  We gave here 12/2012. Zostavax: Told her to contact her insurance company to find out her cost for this. If she wishes to proceed , she can call us and we can send an order in to the pharmacy for her to go there for the injection. Influenza vaccine: Was given here 10/05/2014. Given here 10/18/2015    SHE HAD CPE 10/30/2013. THE FOLLOWING IS COPIED FROM THAT NOTE:  Visit for preventive health examination  A. Screening Labs: She has had recent CBC, CMET. Her last lipid panel was 07/04/13 which showed triglycerides 70, HDL 58, LDL 67  B. YHC:WCBJSEGB--TDV 73.  C. Screening Mammogram: Last mammogram was 02/25/2013  D. DEXA/BMD:  She has been on Fosamax in the past.  Will not do further bone density test as I would not do further treatment regardless of the test results. E. Colorectal Cancer Screening:  Last colonoscopy was by Dr. Benson Norway 08/13/2008 F. Immunizations:  Influenza: 09/2013, 10/05/2014 Tetanus: 12/2012 Pneumococcal: She received the Pneumovax 11/30/09. Prevnar 13 --given here 12/2012.  Zostavax: she defers.  ROV 3 months, sooner if needed.   [address] [place], [place], [facility] 01/20/2016 12:04 PM

## 2016-01-21 ENCOUNTER — Other Ambulatory Visit: Payer: Self-pay

## 2016-01-21 LAB — HEMOGLOBIN A1C
Hgb A1c MFr Bld: 7 % — ABNORMAL HIGH (ref <5.7)
MEAN PLASMA GLUCOSE: 154 mg/dL

## 2016-01-21 MED ORDER — INSULIN ASPART 100 UNIT/ML FLEXPEN: PEN_INJECTOR | SUBCUTANEOUS | 11 refills | Status: DC

## 2016-01-21 NOTE — Telephone Encounter (Signed)
rx filled

## 2016-01-26 ENCOUNTER — Other Ambulatory Visit: Payer: Self-pay | Admitting: Family Medicine

## 2016-01-26 DIAGNOSIS — R109 Unspecified abdominal pain: Secondary | ICD-10-CM

## 2016-01-26 DIAGNOSIS — R1900 Intra-abdominal and pelvic swelling, mass and lump, unspecified site: Secondary | ICD-10-CM

## 2016-01-31 ENCOUNTER — Ambulatory Visit (HOSPITAL_COMMUNITY): Payer: Medicare HMO

## 2016-02-04 ENCOUNTER — Telehealth: Payer: Self-pay

## 2016-02-04 NOTE — Telephone Encounter (Signed)
Pt states she is having her Ct scan on 1-16

## 2016-02-08 ENCOUNTER — Ambulatory Visit (HOSPITAL_COMMUNITY)
Admission: RE | Admit: 2016-02-08 | Discharge: 2016-02-08 | Disposition: A | Discharge status: Home / Self Care | Payer: Medicare HMO | Source: Ambulatory Visit | Attending: Physician Assistant | Admitting: Physician Assistant

## 2016-02-08 DIAGNOSIS — I700 Atherosclerosis of aorta: Secondary | ICD-10-CM | POA: Insufficient documentation

## 2016-02-08 DIAGNOSIS — K5730 Diverticulosis of large intestine without perforation or abscess without bleeding: Secondary | ICD-10-CM | POA: Diagnosis not present

## 2016-02-08 DIAGNOSIS — K439 Ventral hernia without obstruction or gangrene: Secondary | ICD-10-CM | POA: Diagnosis not present

## 2016-02-08 DIAGNOSIS — R1111 Vomiting without nausea: Secondary | ICD-10-CM | POA: Diagnosis not present

## 2016-02-08 DIAGNOSIS — R1900 Intra-abdominal and pelvic swelling, mass and lump, unspecified site: Secondary | ICD-10-CM

## 2016-02-08 DIAGNOSIS — R109 Unspecified abdominal pain: Secondary | ICD-10-CM

## 2016-02-08 DIAGNOSIS — Q433 Congenital malformations of intestinal fixation: Secondary | ICD-10-CM | POA: Insufficient documentation

## 2016-02-11 ENCOUNTER — Telehealth: Payer: Self-pay

## 2016-02-11 MED ORDER — ONETOUCH ULTRASOFT LANCETS MISC: 12 refills | Status: DC

## 2016-02-11 MED ORDER — GLUCOSE BLOOD VI STRP: ORAL_STRIP | 12 refills | Status: DC

## 2016-02-11 MED ORDER — ONETOUCH ULTRA SYSTEM W/DEVICE KIT: PACK | 0 refills | Status: DC

## 2016-02-11 NOTE — Telephone Encounter (Signed)
Pharmacy sent rx req for one touch products. That is the only thing pt ins will cover

## 2016-02-15 DIAGNOSIS — R69 Illness, unspecified: Secondary | ICD-10-CM | POA: Diagnosis not present

## 2016-02-16 ENCOUNTER — Encounter (HOSPITAL_COMMUNITY): Payer: Self-pay

## 2016-02-16 DIAGNOSIS — E559 Vitamin D deficiency, unspecified: Secondary | ICD-10-CM | POA: Diagnosis present

## 2016-02-16 DIAGNOSIS — Z87891 Personal history of nicotine dependence: Secondary | ICD-10-CM

## 2016-02-16 DIAGNOSIS — Z951 Presence of aortocoronary bypass graft: Secondary | ICD-10-CM

## 2016-02-16 DIAGNOSIS — E1140 Type 2 diabetes mellitus with diabetic neuropathy, unspecified: Secondary | ICD-10-CM | POA: Diagnosis present

## 2016-02-16 DIAGNOSIS — K5730 Diverticulosis of large intestine without perforation or abscess without bleeding: Secondary | ICD-10-CM | POA: Diagnosis present

## 2016-02-16 DIAGNOSIS — Z794 Long term (current) use of insulin: Secondary | ICD-10-CM

## 2016-02-16 DIAGNOSIS — I130 Hypertensive heart and chronic kidney disease with heart failure and stage 1 through stage 4 chronic kidney disease, or unspecified chronic kidney disease: Secondary | ICD-10-CM | POA: Diagnosis present

## 2016-02-16 DIAGNOSIS — Z888 Allergy status to other drugs, medicaments and biological substances status: Secondary | ICD-10-CM

## 2016-02-16 DIAGNOSIS — I5032 Chronic diastolic (congestive) heart failure: Secondary | ICD-10-CM | POA: Diagnosis present

## 2016-02-16 DIAGNOSIS — Z6829 Body mass index (BMI) 29.0-29.9, adult: Secondary | ICD-10-CM

## 2016-02-16 DIAGNOSIS — K56609 Unspecified intestinal obstruction, unspecified as to partial versus complete obstruction: Secondary | ICD-10-CM | POA: Diagnosis not present

## 2016-02-16 DIAGNOSIS — Z9049 Acquired absence of other specified parts of digestive tract: Secondary | ICD-10-CM

## 2016-02-16 DIAGNOSIS — Z7982 Long term (current) use of aspirin: Secondary | ICD-10-CM

## 2016-02-16 DIAGNOSIS — E785 Hyperlipidemia, unspecified: Secondary | ICD-10-CM | POA: Diagnosis present

## 2016-02-16 DIAGNOSIS — R402414 Glasgow coma scale score 13-15, 24 hours or more after hospital admission: Secondary | ICD-10-CM | POA: Diagnosis present

## 2016-02-16 DIAGNOSIS — E1122 Type 2 diabetes mellitus with diabetic chronic kidney disease: Secondary | ICD-10-CM | POA: Diagnosis present

## 2016-02-16 DIAGNOSIS — Z79899 Other long term (current) drug therapy: Secondary | ICD-10-CM

## 2016-02-16 DIAGNOSIS — Z9851 Tubal ligation status: Secondary | ICD-10-CM

## 2016-02-16 DIAGNOSIS — Z886 Allergy status to analgesic agent status: Secondary | ICD-10-CM

## 2016-02-16 DIAGNOSIS — Q433 Congenital malformations of intestinal fixation: Secondary | ICD-10-CM

## 2016-02-16 DIAGNOSIS — K5900 Constipation, unspecified: Secondary | ICD-10-CM | POA: Diagnosis not present

## 2016-02-16 DIAGNOSIS — N183 Chronic kidney disease, stage 3 (moderate): Secondary | ICD-10-CM | POA: Diagnosis present

## 2016-02-16 DIAGNOSIS — Z833 Family history of diabetes mellitus: Secondary | ICD-10-CM

## 2016-02-16 DIAGNOSIS — G8929 Other chronic pain: Secondary | ICD-10-CM | POA: Diagnosis present

## 2016-02-16 DIAGNOSIS — R69 Illness, unspecified: Secondary | ICD-10-CM | POA: Diagnosis not present

## 2016-02-16 DIAGNOSIS — N390 Urinary tract infection, site not specified: Secondary | ICD-10-CM | POA: Diagnosis present

## 2016-02-16 DIAGNOSIS — K219 Gastro-esophageal reflux disease without esophagitis: Secondary | ICD-10-CM | POA: Diagnosis present

## 2016-02-16 DIAGNOSIS — Z4682 Encounter for fitting and adjustment of non-vascular catheter: Secondary | ICD-10-CM | POA: Diagnosis not present

## 2016-02-16 DIAGNOSIS — M810 Age-related osteoporosis without current pathological fracture: Secondary | ICD-10-CM | POA: Diagnosis present

## 2016-02-16 DIAGNOSIS — E669 Obesity, unspecified: Secondary | ICD-10-CM | POA: Diagnosis present

## 2016-02-16 LAB — URINALYSIS, ROUTINE W REFLEX MICROSCOPIC
BILIRUBIN URINE: NEGATIVE
Glucose, UA: NEGATIVE mg/dL
Hgb urine dipstick: NEGATIVE
KETONES UR: NEGATIVE mg/dL
Nitrite: NEGATIVE
PH: 5 (ref 5.0–8.0)
PROTEIN: 30 mg/dL — AB
Specific Gravity, Urine: 1.015 (ref 1.005–1.030)

## 2016-02-16 LAB — COMPREHENSIVE METABOLIC PANEL
ALT: 17 U/L (ref 14–54)
ANION GAP: 13 (ref 5–15)
AST: 22 U/L (ref 15–41)
Albumin: 4.1 g/dL (ref 3.5–5.0)
Alkaline Phosphatase: 89 U/L (ref 38–126)
BUN: 29 mg/dL — ABNORMAL HIGH (ref 6–20)
CHLORIDE: 103 mmol/L (ref 101–111)
CO2: 23 mmol/L (ref 22–32)
CREATININE: 1.79 mg/dL — AB (ref 0.44–1.00)
Calcium: 9.4 mg/dL (ref 8.9–10.3)
GFR calc Af Amer: 31 mL/min — ABNORMAL LOW (ref >60)
GFR, EST NON AFRICAN AMERICAN: 27 mL/min — AB (ref >60)
Glucose, Bld: 105 mg/dL — ABNORMAL HIGH (ref 65–99)
POTASSIUM: 3.8 mmol/L (ref 3.5–5.1)
Sodium: 139 mmol/L (ref 135–145)
Total Bilirubin: 0.6 mg/dL (ref 0.3–1.2)
Total Protein: 7.6 g/dL (ref 6.5–8.1)

## 2016-02-16 LAB — CBC
HEMATOCRIT: 38.7 % (ref 36.0–46.0)
HEMOGLOBIN: 12.7 g/dL (ref 12.0–15.0)
MCH: 28 pg (ref 26.0–34.0)
MCHC: 32.8 g/dL (ref 30.0–36.0)
MCV: 85.4 fL (ref 78.0–100.0)
PLATELETS: 201 10*3/uL (ref 150–400)
RBC: 4.53 MIL/uL (ref 3.87–5.11)
RDW: 14 % (ref 11.5–15.5)
WBC: 10.5 10*3/uL (ref 4.0–10.5)

## 2016-02-16 LAB — LIPASE, BLOOD: LIPASE: 27 U/L (ref 11–51)

## 2016-02-16 MED ORDER — ONDANSETRON 4 MG PO TBDP
4.0000 mg | ORAL_TABLET | Freq: Once | ORAL | Status: AC
  Administered 2016-02-16: 4 mg via ORAL

## 2016-02-16 MED ORDER — ONDANSETRON 4 MG PO TBDP
ORAL_TABLET | ORAL | Status: AC
  Filled 2016-02-16: qty 1

## 2016-02-16 NOTE — ED Notes (Addendum)
Patient escorted to restroom by this RN.  Patient c/o diarrhea and actively vomiting.  Pt now states she feels like she is breathing heavy, SpO2 monitored.

## 2016-02-16 NOTE — ED Triage Notes (Signed)
Pt reports abd pain and vomiting since 6 pm.

## 2016-02-17 ENCOUNTER — Inpatient Hospital Stay (HOSPITAL_COMMUNITY): Payer: Medicare HMO

## 2016-02-17 ENCOUNTER — Emergency Department (HOSPITAL_COMMUNITY): Payer: Medicare HMO

## 2016-02-17 ENCOUNTER — Encounter (HOSPITAL_COMMUNITY): Payer: Self-pay | Admitting: General Practice

## 2016-02-17 ENCOUNTER — Inpatient Hospital Stay (HOSPITAL_COMMUNITY)
Admission: EM | Admit: 2016-02-17 | Discharge: 2016-02-19 | DRG: 389 | Disposition: A | Discharge status: Home / Self Care | Payer: Medicare HMO | Attending: Internal Medicine | Admitting: Internal Medicine

## 2016-02-17 DIAGNOSIS — E1140 Type 2 diabetes mellitus with diabetic neuropathy, unspecified: Secondary | ICD-10-CM | POA: Diagnosis not present

## 2016-02-17 DIAGNOSIS — G8929 Other chronic pain: Secondary | ICD-10-CM | POA: Diagnosis present

## 2016-02-17 DIAGNOSIS — E785 Hyperlipidemia, unspecified: Secondary | ICD-10-CM | POA: Diagnosis not present

## 2016-02-17 DIAGNOSIS — K5900 Constipation, unspecified: Secondary | ICD-10-CM | POA: Diagnosis not present

## 2016-02-17 DIAGNOSIS — N390 Urinary tract infection, site not specified: Secondary | ICD-10-CM | POA: Diagnosis not present

## 2016-02-17 DIAGNOSIS — Q433 Congenital malformations of intestinal fixation: Secondary | ICD-10-CM | POA: Diagnosis not present

## 2016-02-17 DIAGNOSIS — Z9851 Tubal ligation status: Secondary | ICD-10-CM | POA: Diagnosis not present

## 2016-02-17 DIAGNOSIS — Z888 Allergy status to other drugs, medicaments and biological substances status: Secondary | ICD-10-CM | POA: Diagnosis not present

## 2016-02-17 DIAGNOSIS — E559 Vitamin D deficiency, unspecified: Secondary | ICD-10-CM | POA: Diagnosis not present

## 2016-02-17 DIAGNOSIS — E118 Type 2 diabetes mellitus with unspecified complications: Secondary | ICD-10-CM

## 2016-02-17 DIAGNOSIS — R402414 Glasgow coma scale score 13-15, 24 hours or more after hospital admission: Secondary | ICD-10-CM | POA: Diagnosis present

## 2016-02-17 DIAGNOSIS — R1111 Vomiting without nausea: Secondary | ICD-10-CM | POA: Diagnosis not present

## 2016-02-17 DIAGNOSIS — Z4682 Encounter for fitting and adjustment of non-vascular catheter: Secondary | ICD-10-CM | POA: Diagnosis not present

## 2016-02-17 DIAGNOSIS — I10 Essential (primary) hypertension: Secondary | ICD-10-CM | POA: Diagnosis not present

## 2016-02-17 DIAGNOSIS — Z6829 Body mass index (BMI) 29.0-29.9, adult: Secondary | ICD-10-CM | POA: Diagnosis not present

## 2016-02-17 DIAGNOSIS — N183 Chronic kidney disease, stage 3 unspecified: Secondary | ICD-10-CM | POA: Diagnosis present

## 2016-02-17 DIAGNOSIS — I130 Hypertensive heart and chronic kidney disease with heart failure and stage 1 through stage 4 chronic kidney disease, or unspecified chronic kidney disease: Secondary | ICD-10-CM | POA: Diagnosis not present

## 2016-02-17 DIAGNOSIS — R8281 Pyuria: Secondary | ICD-10-CM

## 2016-02-17 DIAGNOSIS — E784 Other hyperlipidemia: Secondary | ICD-10-CM

## 2016-02-17 DIAGNOSIS — I5032 Chronic diastolic (congestive) heart failure: Secondary | ICD-10-CM | POA: Diagnosis not present

## 2016-02-17 DIAGNOSIS — K56600 Partial intestinal obstruction, unspecified as to cause: Secondary | ICD-10-CM | POA: Diagnosis not present

## 2016-02-17 DIAGNOSIS — K5730 Diverticulosis of large intestine without perforation or abscess without bleeding: Secondary | ICD-10-CM | POA: Diagnosis present

## 2016-02-17 DIAGNOSIS — M810 Age-related osteoporosis without current pathological fracture: Secondary | ICD-10-CM | POA: Diagnosis present

## 2016-02-17 DIAGNOSIS — Z886 Allergy status to analgesic agent status: Secondary | ICD-10-CM | POA: Diagnosis not present

## 2016-02-17 DIAGNOSIS — Z0189 Encounter for other specified special examinations: Secondary | ICD-10-CM

## 2016-02-17 DIAGNOSIS — Z79899 Other long term (current) drug therapy: Secondary | ICD-10-CM | POA: Diagnosis not present

## 2016-02-17 DIAGNOSIS — I517 Cardiomegaly: Secondary | ICD-10-CM | POA: Diagnosis not present

## 2016-02-17 DIAGNOSIS — K219 Gastro-esophageal reflux disease without esophagitis: Secondary | ICD-10-CM | POA: Diagnosis present

## 2016-02-17 DIAGNOSIS — Z794 Long term (current) use of insulin: Secondary | ICD-10-CM | POA: Diagnosis not present

## 2016-02-17 DIAGNOSIS — K56609 Unspecified intestinal obstruction, unspecified as to partial versus complete obstruction: Secondary | ICD-10-CM | POA: Diagnosis not present

## 2016-02-17 DIAGNOSIS — E1122 Type 2 diabetes mellitus with diabetic chronic kidney disease: Secondary | ICD-10-CM | POA: Diagnosis not present

## 2016-02-17 DIAGNOSIS — Z87891 Personal history of nicotine dependence: Secondary | ICD-10-CM | POA: Diagnosis not present

## 2016-02-17 DIAGNOSIS — Z7982 Long term (current) use of aspirin: Secondary | ICD-10-CM | POA: Diagnosis not present

## 2016-02-17 DIAGNOSIS — Z833 Family history of diabetes mellitus: Secondary | ICD-10-CM | POA: Diagnosis not present

## 2016-02-17 HISTORY — DX: Pneumonia, unspecified organism: J18.9

## 2016-02-17 HISTORY — DX: Chronic obstructive pulmonary disease, unspecified: J44.9

## 2016-02-17 HISTORY — DX: Unspecified intestinal obstruction, unspecified as to partial versus complete obstruction: K56.609

## 2016-02-17 HISTORY — DX: Type 2 diabetes mellitus without complications: E11.9

## 2016-02-17 HISTORY — DX: Unspecified osteoarthritis, unspecified site: M19.90

## 2016-02-17 LAB — GLUCOSE, CAPILLARY
Glucose-Capillary: 173 mg/dL — ABNORMAL HIGH (ref 65–99)
Glucose-Capillary: 186 mg/dL — ABNORMAL HIGH (ref 65–99)

## 2016-02-17 LAB — CBG MONITORING, ED
GLUCOSE-CAPILLARY: 244 mg/dL — AB (ref 65–99)
Glucose-Capillary: 214 mg/dL — ABNORMAL HIGH (ref 65–99)

## 2016-02-17 MED ORDER — ONDANSETRON HCL 4 MG/2ML IJ SOLN
4.0000 mg | Freq: Once | INTRAMUSCULAR | Status: AC
  Administered 2016-02-17: 4 mg via INTRAVENOUS
  Filled 2016-02-17: qty 2

## 2016-02-17 MED ORDER — INSULIN ASPART 100 UNIT/ML ~~LOC~~ SOLN: 0.0000 [IU] | Freq: Every day | SUBCUTANEOUS | Status: DC

## 2016-02-17 MED ORDER — FENTANYL CITRATE (PF) 100 MCG/2ML IJ SOLN: 12.5000 ug | INTRAMUSCULAR | Status: DC | PRN

## 2016-02-17 MED ORDER — ENOXAPARIN SODIUM 30 MG/0.3ML ~~LOC~~ SOLN
30.0000 mg | SUBCUTANEOUS | Status: DC
  Administered 2016-02-17 – 2016-02-19 (×3): 30 mg via SUBCUTANEOUS
  Filled 2016-02-17 (×3): qty 0.3

## 2016-02-17 MED ORDER — ONDANSETRON HCL 4 MG/2ML IJ SOLN: 4.0000 mg | Freq: Four times a day (QID) | INTRAMUSCULAR | Status: DC | PRN

## 2016-02-17 MED ORDER — DIATRIZOATE MEGLUMINE & SODIUM 66-10 % PO SOLN
90.0000 mL | Freq: Once | ORAL | Status: AC
  Administered 2016-02-17: 90 mL via NASOGASTRIC
  Filled 2016-02-17: qty 90

## 2016-02-17 MED ORDER — INSULIN ASPART 100 UNIT/ML ~~LOC~~ SOLN
0.0000 [IU] | Freq: Three times a day (TID) | SUBCUTANEOUS | Status: DC
  Administered 2016-02-17: 3 [IU] via SUBCUTANEOUS
  Administered 2016-02-17 – 2016-02-18 (×4): 5 [IU] via SUBCUTANEOUS
  Administered 2016-02-19: 15 [IU] via SUBCUTANEOUS
  Administered 2016-02-19: 3 [IU] via SUBCUTANEOUS
  Filled 2016-02-17: qty 1

## 2016-02-17 MED ORDER — ACETAMINOPHEN 650 MG RE SUPP: 650.0000 mg | Freq: Four times a day (QID) | RECTAL | Status: DC | PRN

## 2016-02-17 MED ORDER — ACETAMINOPHEN 325 MG PO TABS: 650.0000 mg | ORAL_TABLET | Freq: Four times a day (QID) | ORAL | Status: DC | PRN

## 2016-02-17 MED ORDER — PANTOPRAZOLE SODIUM 40 MG IV SOLR
40.0000 mg | INTRAVENOUS | Status: DC
  Administered 2016-02-17 – 2016-02-19 (×3): 40 mg via INTRAVENOUS
  Filled 2016-02-17 (×3): qty 40

## 2016-02-17 MED ORDER — ONDANSETRON HCL 4 MG PO TABS: 4.0000 mg | ORAL_TABLET | Freq: Four times a day (QID) | ORAL | Status: DC | PRN

## 2016-02-17 MED ORDER — FENTANYL CITRATE (PF) 100 MCG/2ML IJ SOLN
50.0000 ug | Freq: Once | INTRAMUSCULAR | Status: AC
  Administered 2016-02-17: 50 ug via INTRAVENOUS
  Filled 2016-02-17: qty 2

## 2016-02-17 MED ORDER — CLONIDINE HCL 0.2 MG/24HR TD PTWK
0.2000 mg | MEDICATED_PATCH | TRANSDERMAL | Status: DC
  Administered 2016-02-17: 0.2 mg via TRANSDERMAL
  Filled 2016-02-17: qty 1

## 2016-02-17 MED ORDER — FUROSEMIDE 10 MG/ML IJ SOLN
20.0000 mg | Freq: Every day | INTRAMUSCULAR | Status: DC
  Administered 2016-02-17 – 2016-02-18 (×2): 20 mg via INTRAVENOUS
  Filled 2016-02-17 (×2): qty 2

## 2016-02-17 MED ORDER — DEXTROSE-NACL 5-0.45 % IV SOLN
INTRAVENOUS | Status: DC
  Administered 2016-02-17 – 2016-02-18 (×2): via INTRAVENOUS

## 2016-02-17 MED ORDER — SODIUM CHLORIDE 0.9 % IV SOLN: INTRAVENOUS | Status: AC

## 2016-02-17 MED ORDER — SODIUM CHLORIDE 0.9 % IV SOLN
INTRAVENOUS | Status: DC
  Administered 2016-02-17: 1000 mL via INTRAVENOUS

## 2016-02-17 MED ORDER — IOPAMIDOL (ISOVUE-300) INJECTION 61%
INTRAVENOUS | Status: AC
  Filled 2016-02-17: qty 30

## 2016-02-17 MED ORDER — HYDRALAZINE HCL 20 MG/ML IJ SOLN: 5.0000 mg | INTRAMUSCULAR | Status: DC | PRN

## 2016-02-17 NOTE — ED Notes (Signed)
cbg was 214

## 2016-02-17 NOTE — Consult Note (Signed)
[facility] Surgery Consult Note  Laura Best Jul 11, 1940  [id number].    Requesting MD: Hal Hope, MD  Chief Complaint/Reason for Consult: SBO  HPI: 76 y/o female with a medical history of DM, HTN, HLD, GERD, CKD, malrotation of the intestine, and diverticulitis who presented to [facility] with acute onset abdominal pain. Pain started last night around 6:00 PM. It is described as intermittent, cramping lower abdominal pain - located all over her abdomen. Rated 8/10. She reports similar pain once in the past, about one year ago, when she was admitted to the hospital for diverticulitis. Associated sxs include vomiting and abdominal distention. She denies changes in bowel habits, stating that she does not have regular bowel movements and sometimes goes 5 days without having a BM. She takes OTC stool softeners. Her last BM was 5 days ago. She denies flatus today but had flatus yesterday. She denies sick contacts, fevers, chills, hematemesis, melena, hematochezia, or urinary sxs.   Past abdominal surgeries: laparoscopic converted to open appendectomy (09/27/13 Tsuei, MD), tubal ligation  Blood thinning medications: baby ASA Tobacco use: former smoker, quit 5-10 years ago.  Emergency room workup: - CT ABD/PELV: congenital malrotation of the bowel. Dilated loops small bowel w/ transition zone in RLQ. PO contrast from CT on 1/16 in the colon. Sigmoid diverticulosis present w/o inflammation. Ventral, fat-containaing hernia. - WBC 10.5; CMET and Lipase WNL - UA w/ large leukocytes. Cx pending   ROS: Review of Systems  Constitutional: Negative for chills and fever.  Respiratory: Negative for hemoptysis and shortness of breath.   Cardiovascular: Negative for chest pain and palpitations.  Gastrointestinal: Positive for abdominal pain, constipation, nausea and vomiting. Negative for blood in stool, diarrhea, heartburn and melena.  Neurological: Negative for dizziness and headaches.  All other systems  reviewed and are negative.  Family History  Problem Relation Age of Onset  . Cancer Father   . Diabetes Father   . Cancer Brother     Brain  . Cancer Sister     abdominal fat    Past Medical History:  Diagnosis Date  . CKD (chronic kidney disease) stage 3, GFR 30-59 ml/min 12/25/2012  . Diabetes mellitus   . Diastolic heart failure ([place])   . Hyperlipidemia   . Hypertension   . Osteoporosis   . Osteoporosis   . Tricuspid regurgitation    Echo 05/02/06-Nml LV. Mild MR. Mod TR  . Vitamin D deficiency     Past Surgical History:  Procedure Laterality Date  . HEART CHAMBER REVISION     patched hole --1993  . LAPAROSCOPIC APPENDECTOMY N/A 09/27/2013   Procedure: APPENDECTOMY - CONVERTED FROM LAPAROSCOPIC;  Surgeon: Donnie Mesa, MD;  Location: [place];  Service: General;  Laterality: N/A;  . TUBAL LIGATION      Social History:  reports that she quit smoking about 7 years ago. She has never used smokeless tobacco. She reports that she does not drink alcohol or use drugs.  Allergies:  Allergies  Allergen Reactions  . Ace Inhibitors Other (See Comments)    Hyperkalemia  . Actos [Pioglitazone] Swelling  . Aspirin Other (See Comments)    G.I. Upset.   . Metformin And Related Diarrhea     (Not in a hospital admission)  Blood pressure 168/63, pulse 86, temperature 98.9 F (37.2 C), temperature source Oral, resp. rate 18, weight 74.8 kg (165 lb), SpO2 90 %. Physical Exam: General: pleasant, obest white female who is laying in bed in NAD HEENT: head is normocephalic, atraumatic.  NGT in R nare w/ ~600 cc bilious output in cannister. Heart: regular, rate, and rhythm.  Palpable pedal pulses bilaterally Lungs: CTAB, no wheezes, rhonchi, or rales noted.  Respiratory effort nonlabored Abd: soft, TTP LLQ w/ guarding. Bowel sounds present in all 4 quadrants, reducible incisional hernia at previous laparotomy incision w/out overlying skin changes. MS: all 4 extremities are symmetrical  with no cyanosis, clubbing, or edema. Skin: warm and dry with no masses, lesions, or rashes Psych: A&Ox3 with an appropriate affect. Neuro: CM 2-12 intact, extremity CSM intact bilaterally, normal speech  Results for orders placed or performed during the hospital encounter of 02/17/16 (from the past 48 hour(s))  Lipase, blood     Status: None   Collection Time: 02/16/16  9:15 PM  Result Value Ref Range   Lipase 27 11 - 51 U/L  Comprehensive metabolic panel     Status: Abnormal   Collection Time: 02/16/16  9:15 PM  Result Value Ref Range   Sodium 139 135 - 145 mmol/L   Potassium 3.8 3.5 - 5.1 mmol/L   Chloride 103 101 - 111 mmol/L   CO2 23 22 - 32 mmol/L   Glucose, Bld 105 (H) 65 - 99 mg/dL   BUN 29 (H) 6 - 20 mg/dL   Creatinine, Ser 1.79 (H) 0.44 - 1.00 mg/dL   Calcium 9.4 8.9 - 10.3 mg/dL   Total Protein 7.6 6.5 - 8.1 g/dL   Albumin 4.1 3.5 - 5.0 g/dL   AST 22 15 - 41 U/L   ALT 17 14 - 54 U/L   Alkaline Phosphatase 89 38 - 126 U/L   Total Bilirubin 0.6 0.3 - 1.2 mg/dL   GFR calc non Af Amer 27 (L) >60 mL/min   GFR calc Af Amer 31 (L) >60 mL/min    Comment: (NOTE) The eGFR has been calculated using the CKD EPI equation. This calculation has not been validated in all clinical situations. eGFR's persistently <60 mL/min signify possible Chronic Kidney Disease.    Anion gap 13 5 - 15  CBC     Status: None   Collection Time: 02/16/16  9:15 PM  Result Value Ref Range   WBC 10.5 4.0 - 10.5 K/uL   RBC 4.53 3.87 - 5.11 MIL/uL   Hemoglobin 12.7 12.0 - 15.0 g/dL   HCT 38.7 36.0 - 46.0 %   MCV 85.4 78.0 - 100.0 fL   MCH 28.0 26.0 - 34.0 pg   MCHC 32.8 30.0 - 36.0 g/dL   RDW 14.0 11.5 - 15.5 %   Platelets 201 150 - 400 K/uL  Urinalysis, Routine w reflex microscopic     Status: Abnormal   Collection Time: 02/16/16  9:20 PM  Result Value Ref Range   Color, Urine YELLOW YELLOW   APPearance CLEAR CLEAR   Specific Gravity, Urine 1.015 1.005 - 1.030   pH 5.0 5.0 - 8.0   Glucose,  UA NEGATIVE NEGATIVE mg/dL   Hgb urine dipstick NEGATIVE NEGATIVE   Bilirubin Urine NEGATIVE NEGATIVE   Ketones, ur NEGATIVE NEGATIVE mg/dL   Protein, ur 30 (A) NEGATIVE mg/dL   Nitrite NEGATIVE NEGATIVE   Leukocytes, UA LARGE (A) NEGATIVE   RBC / HPF 0-5 0 - 5 RBC/hpf   WBC, UA TOO NUMEROUS TO COUNT 0 - 5 WBC/hpf   Bacteria, UA RARE (A) NONE SEEN   Squamous Epithelial / LPF 0-5 (A) NONE SEEN   Hyaline Casts, UA PRESENT    Ct Abdomen Pelvis Wo Contrast  Result   Date: 02/17/2016 CLINICAL DATA:  75-year-old female with abdominal pain and vomiting. EXAM: CT ABDOMEN AND PELVIS WITHOUT CONTRAST TECHNIQUE: Multidetector CT imaging of the abdomen and pelvis was performed following the standard protocol without IV contrast. COMPARISON:  CT of the abdomen pelvis 02/08/2016. FINDINGS: Evaluation of this exam is limited in the absence of intravenous contrast. Lower chest: Minimal linear bibasilar atelectasis/scarring noted. The visualized lung bases are otherwise clear. No intra-abdominal free air or free fluid. Hepatobiliary: No focal liver abnormality is seen. No gallstones, gallbladder wall thickening, or biliary dilatation. Pancreas: Unremarkable. No pancreatic ductal dilatation or surrounding inflammatory changes. Spleen: Normal in size without focal abnormality. Adrenals/Urinary Tract: The adrenal glands appear unremarkable. Vascular calcification versus less likely punctate nonobstructing right renal interpolar stone. There is no hydronephrosis or obstructing stone on either side. Focal area of scarring noted along the posterior cortex of the superior pole of the right kidney. The visualized ureters and urinary bladder appear unremarkable. Stomach/Bowel: There is congenital malrotation of the bowel. There is a dilated loop of small bowel in the right lower abdomen with associated mild inflammatory changes. There is abutment of loops of small bowel in this region which may represent adhesion. The adjacent  more distal segments of the bowel appears collapsed. A transition zone is seen in the right lower quadrant (series 2, image 52 and coronal image 53). There is mild engorgement of the mesentery associated with this bowel loops. Oral contrast from prior study mixed with stool noted throughout the colon. There is colonic diverticulosis without active inflammatory changes. The cecum is located in the lower abdomen in the midline anteriorly. There is areas of submucosal fat deposit along the colonic wall likely sequela of chronic inflammation. Appendectomy. Vascular/Lymphatic: There is advanced aortoiliac atherosclerotic disease. Evaluation of the vasculature is limited on this noncontrast study. The IVC appears unremarkable. No portal venous gas identified. There is no adenopathy. Reproductive: The uterus and ovaries are grossly unremarkable. Other: Small fat containing umbilical and supraumbilical hernias noted. There is mild focal protrusion of the anterior wall of the transverse colon into the uppermost hernia. Musculoskeletal: Osteopenia with mild degenerative changes of the spine. Multilevel disc desiccation with vacuum phenomena. No acute fracture. IMPRESSION: Mildly dilated loops of small bowel in the right lower abdomen with a transition zone in the right lower quadrant concerning for an early small-bowel obstruction possibly related to underlying adhesions. A segmental ileus or related to enteritis is less likely. Clinical correlation and follow-up recommended. Colonic diverticulosis. Congenital malrotation of the bowel. Electronically Signed   By: Arash  Radparvar M.D.   On: 02/17/2016 04:58   Dg Chest Port 1 View  Result Date: 02/17/2016 CLINICAL DATA:  75-year-old female with possible aspiration. EXAM: PORTABLE CHEST 1 VIEW COMPARISON:  Chest radiograph dated 09/27/2013 FINDINGS: Single portable view of the chest does not demonstrate a focal consolidation. There is no pleural effusion or pneumothorax.  There is stable mild cardiomegaly. Median sternotomy wires noted. No acute osseous pathology. IMPRESSION: No acute cardiopulmonary process. Stable cardiomegaly. Electronically Signed   By: Arash  Radparvar M.D.   On: 02/17/2016 04:14   Dg Abd Portable 1v  Result Date: 02/17/2016 CLINICAL DATA:  NG tube placement.  Constipation. EXAM: PORTABLE ABDOMEN - 1 VIEW COMPARISON:  CT 02/17/2016. FINDINGS: NG tube noted with tip below left hemidiaphragm. No bowel distention identified. No free air. Prior CABG. Cardiomegaly . Tiny right pleural effusion cannot be excluded . IMPRESSION: 1. NG tube noted with tip below left hemidiaphragm. No bowel distention identified.   2. Prior CABG. Cardiomegaly. Small right pleural effusion cannot be excluded . Electronically Signed   By: Marcello Moores  Register   On: 02/17/2016 07:19   Assessment/Plan SBO - Acute onset cramping, intermittent abdominal pain associated with vomiting - Hx lap converted to open appendectomy, tubal ligation - Recommend NGT decompression and bowel rest - Will order small bowel protocol (administer Gastografin via NGT, clamp tube for 1 hour. Then return NGT to LIWS. Obtain abdominal film in 8 h).  FEN: NPO, IVF ID: none  VTE: SCD's   Plan: No acute surgical needs. Small bowel protocol. General surgery will follow   Jill Alexanders, [facility] Surgery 02/17/2016, 7:40 AM Pager: [phone] Consults: [phone] Mon-Fri 7:00 am-4:30 pm Sat-Sun 7:00 am-11:30 am

## 2016-02-17 NOTE — ED Notes (Addendum)
Gastrografin given via NGT, now clamped. Nuc Med notified

## 2016-02-17 NOTE — ED Notes (Signed)
Laura Best in radiology made aware that gastric emptying study will be postponed until tmrw per dr Arelia Longest verbal order

## 2016-02-17 NOTE — ED Notes (Signed)
cbg was 244

## 2016-02-17 NOTE — ED Notes (Signed)
Spoke with Dr. Marily Memos about gastric emptying study and needing to eat, although pt is NPO. Reports will do test tomorrow.

## 2016-02-17 NOTE — ED Notes (Signed)
Pt had drunk full bottle of Contrast but had 1 extra large emesis while attempting to use bedpan; pt sats dropped to 70s but came back up to 90%; pt placed on O2 for comfort; IV has been started and meds given; pt advised to wait a few minutes before attempting to drink second bottle of contrast; Pt had a small BM; Md notified

## 2016-02-17 NOTE — Progress Notes (Signed)
Report received from crystal

## 2016-02-17 NOTE — H&P (Addendum)
History and Physical    KASHANA BREACH JOA:[id number] DOB: [birth year] DOA: 02/17/2016  PCP: [name], PA-C Patient coming from: Home  Chief Complaint: Abd pain  HPI: Laura SOLIDAY is a 76 y.o. female with medical history significant of  CKD, CHF, HLD, HTN,  Presenting w/ sudden onset abd pain. LLQ, started 1 day ago. Radiates across abdomen, associated w/ bilious emesis. Getting worse. Patient states subjectively that she feels that her abdomen is getting more swollen. Denies fevers, chest pain, shortness breath, palpitations, dysuria, frequency, back pain, rash, neck stiffness headache, LOC, dizziness, vertigo, lower extremity swelling.   ED Course: Objective findings outlined below. NGT inserted by EDP. Sx much improved since that time. 600 mL of bilious fluid in NG tube canister.  Review of Systems: As per HPI otherwise 10 point review of systems negative.   Ambulatory Status:no restrictions  Past Medical History:  Diagnosis Date  . CKD (chronic kidney disease) stage 3, GFR 30-59 ml/min 12/25/2012  . Diabetes mellitus   . Diastolic heart failure ([place])   . Hyperlipidemia   . Hypertension   . Osteoporosis   . Osteoporosis   . Tricuspid regurgitation    Echo 05/02/06-Nml LV. Mild MR. Mod TR  . Vitamin D deficiency     Past Surgical History:  Procedure Laterality Date  . HEART CHAMBER REVISION     patched hole --1993  . LAPAROSCOPIC APPENDECTOMY N/A 09/27/2013   Procedure: APPENDECTOMY - CONVERTED FROM LAPAROSCOPIC;  Surgeon: Donnie Mesa, MD;  Location: [facility];  Service: General;  Laterality: N/A;  . TUBAL LIGATION      Social History   Social History  . Marital status: Widowed    Spouse name: N/A  . Number of children: N/A  . Years of education: N/A   Occupational History  . Not on file.   Social History Main Topics  . Smoking status: Former Smoker    Quit date: 01/19/2009  . Smokeless tobacco: Never Used  . Alcohol use No  . Drug use: No  . Sexual  activity: Not on file   Other Topics Concern  . Not on file   Social History Narrative   Entered 10/2013:   She has never driven.   She lives with her son.    Allergies  Allergen Reactions  . Ace Inhibitors Other (See Comments)    Hyperkalemia  . Actos [Pioglitazone] Swelling  . Aspirin Other (See Comments)    G.I. Upset.   . Metformin And Related Diarrhea    Family History  Problem Relation Age of Onset  . Cancer Father   . Diabetes Father   . Cancer Brother     Brain  . Cancer Sister     abdominal fat    Prior to Admission medications   Medication Sig Start Date End Date Taking? Authorizing Provider  acetaminophen (TYLENOL) 500 MG tablet Take 500 mg by mouth every 6 (six) hours as needed for pain.   Yes Historical Provider, MD  amLODipine (NORVASC) 10 MG tablet Take 1 tablet (10 mg total) by mouth daily. 07/12/15  Yes Lendon Colonel, NP  aspirin EC 81 MG tablet Take 81 mg by mouth daily.   Yes Historical Provider, MD  Cholecalciferol (VITAMIN D) 2000 UNITS CAPS Take 1 capsule by mouth daily.   Yes Historical Provider, MD  cloNIDine (CATAPRES) 0.2 MG tablet TAKE ONE TABLET BY MOUTH TWICE DAILY 11/10/15  Yes Lendon Colonel, NP  furosemide (LASIX) 40 MG tablet TAKE ONE TABLET  BY MOUTH ONCE DAILY 05/12/15  Yes Dorena Bodo, PA-C  gabapentin (NEURONTIN) 100 MG capsule Take 1-2 capsules at bedtime for neuropathy Patient taking differently: Take 100-200 mg by mouth at bedtime.  08/27/15  Yes Salley Scarlet, MD  insulin aspart (NOVOLOG) 100 UNIT/ML FlexPen Take 7 units with luch and supper.  Inject 15 minutes prior to meal or immediately after meal. 01/21/16  Yes Dorena Bodo, PA-C  LEVEMIR FLEXTOUCH 100 UNIT/ML Pen INJECT 22 UNITS SUBCUTANEOUSLY ONCE DAILY AT  10  PM 11/10/15  Yes Dorena Bodo, PA-C  lisinopril (PRINIVIL,ZESTRIL) 2.5 MG tablet Take 2.5 mg by mouth daily.  07/02/15  Yes Historical Provider, MD  omeprazole (PRILOSEC) 20 MG capsule TAKE ONE CAPSULE BY MOUTH  ONCE DAILY 08/16/15  Yes Dorena Bodo, PA-C  simvastatin (ZOCOR) 20 MG tablet TAKE ONE TABLET BY MOUTH ONCE DAILY AT BEDTIME 05/12/15  Yes Mary B Dixon, PA-C  Blood Glucose Monitoring Suppl (ONE TOUCH ULTRA SYSTEM KIT) w/Device KIT 1 meter 02/11/16   Patriciaann Clan Dixon, PA-C  glucose blood test strip 1 each by Other route 4 (four) times daily -  before meals and at bedtime. Use as instructed 10/18/15   Dorena Bodo, PA-C  glucose blood test strip Use as directed. Check blood sugar 3 times daily before meals and at bedtime. 02/11/16   Dorena Bodo, PA-C  Lancets [facility] ULTRASOFT) lancets Use as instructed 02/11/16   Dorena Bodo, PA-C  RELION SHORT PEN NEEDLES 31G X 8 MM MISC USE ONE PEN NEEDLE ONCE DAILY AS DIRECTED 03/10/14   Dorena Bodo, PA-C    Physical Exam: Vitals:   02/17/16 0230 02/17/16 0300 02/17/16 0400 02/17/16 0735  BP: 165/61 168/78 173/73 168/63  Pulse: 84 88 88 86  Resp:   20 18  Temp:      TempSrc:      SpO2: 91% 92% 98% 90%  Weight:         General:  Appears calm and comfortable Eyes:  PERRL, EOMI, normal lids, iris ENT:  grossly normal hearing, lips & tongue, mmm Neck:  no LAD, masses or thyromegaly Cardiovascular:  RRR, III/VI systolic murmur. Trace LE edema.  Respiratory:  CTA bilaterally, no w/r/r. Normal respiratory effort. Abdomen:  Hypoactive bowel sounds, mild tenderness to deep palpation diffusely, nondistended. 600 mL of bilious fluid in NG canister. Skin:  no rash or induration seen on limited exam Musculoskeletal:  grossly normal tone BUE/BLE, good ROM, no bony abnormality Psychiatric:  grossly normal mood and affect, speech fluent and appropriate, AOx3 Neurologic:  CN 2-12 grossly intact, moves all extremities in coordinated fashion, sensation intact  Labs on Admission: I have personally reviewed following labs and imaging studies  CBC:  Recent Labs Lab 02/16/16 2115  WBC 10.5  HGB 12.7  HCT 38.7  MCV 85.4  PLT 201   Basic Metabolic Panel:  Recent  Labs Lab 02/16/16 2115  NA 139  K 3.8  CL 103  CO2 23  GLUCOSE 105*  BUN 29*  CREATININE 1.79*  CALCIUM 9.4   GFR: Estimated Creatinine Clearance: 26.3 mL/min (by C-G formula based on SCr of 1.79 mg/dL (H)). Liver Function Tests:  Recent Labs Lab 02/16/16 2115  AST 22  ALT 17  ALKPHOS 89  BILITOT 0.6  PROT 7.6  ALBUMIN 4.1    Recent Labs Lab 02/16/16 2115  LIPASE 27   No results for input(s): AMMONIA in the last 168 hours. Coagulation Profile: No results for  input(s): INR, PROTIME in the last 168 hours. Cardiac Enzymes: No results for input(s): CKTOTAL, CKMB, CKMBINDEX, TROPONINI in the last 168 hours. BNP (last 3 results) No results for input(s): PROBNP in the last 8760 hours. HbA1C: No results for input(s): HGBA1C in the last 72 hours. CBG: No results for input(s): GLUCAP in the last 168 hours. Lipid Profile: No results for input(s): CHOL, HDL, LDLCALC, TRIG, CHOLHDL, LDLDIRECT in the last 72 hours. Thyroid Function Tests: No results for input(s): TSH, T4TOTAL, FREET4, T3FREE, THYROIDAB in the last 72 hours. Anemia Panel: No results for input(s): VITAMINB12, FOLATE, FERRITIN, TIBC, IRON, RETICCTPCT in the last 72 hours. Urine analysis:    Component Value Date/Time   COLORURINE YELLOW 02/16/2016 2120   APPEARANCEUR CLEAR 02/16/2016 2120   LABSPEC 1.015 02/16/2016 2120   PHURINE 5.0 02/16/2016 2120   GLUCOSEU NEGATIVE 02/16/2016 2120   HGBUR NEGATIVE 02/16/2016 2120   BILIRUBINUR NEGATIVE 02/16/2016 2120   KETONESUR NEGATIVE 02/16/2016 2120   PROTEINUR 30 (A) 02/16/2016 2120   UROBILINOGEN 0.2 05/03/2014 0645   NITRITE NEGATIVE 02/16/2016 2120   LEUKOCYTESUR LARGE (A) 02/16/2016 2120    Creatinine Clearance: Estimated Creatinine Clearance: 26.3 mL/min (by C-G formula based on SCr of 1.79 mg/dL (H)).  Sepsis Labs: '@LABRCNTIP'$ (procalcitonin:4,lacticidven:4) )No results found for this or any previous visit (from the past 240 hour(s)).    Radiological Exams on Admission: Ct Abdomen Pelvis Wo Contrast  Result Date: 02/17/2016 CLINICAL DATA:  76 year old female with abdominal pain and vomiting. EXAM: CT ABDOMEN AND PELVIS WITHOUT CONTRAST TECHNIQUE: Multidetector CT imaging of the abdomen and pelvis was performed following the standard protocol without IV contrast. COMPARISON:  CT of the abdomen pelvis 02/08/2016. FINDINGS: Evaluation of this exam is limited in the absence of intravenous contrast. Lower chest: Minimal linear bibasilar atelectasis/scarring noted. The visualized lung bases are otherwise clear. No intra-abdominal free air or free fluid. Hepatobiliary: No focal liver abnormality is seen. No gallstones, gallbladder wall thickening, or biliary dilatation. Pancreas: Unremarkable. No pancreatic ductal dilatation or surrounding inflammatory changes. Spleen: Normal in size without focal abnormality. Adrenals/Urinary Tract: The adrenal glands appear unremarkable. Vascular calcification versus less likely punctate nonobstructing right renal interpolar stone. There is no hydronephrosis or obstructing stone on either side. Focal area of scarring noted along the posterior cortex of the superior pole of the right kidney. The visualized ureters and urinary bladder appear unremarkable. Stomach/Bowel: There is congenital malrotation of the bowel. There is a dilated loop of small bowel in the right lower abdomen with associated mild inflammatory changes. There is abutment of loops of small bowel in this region which may represent adhesion. The adjacent more distal segments of the bowel appears collapsed. A transition zone is seen in the right lower quadrant (series 2, image 52 and coronal image 53). There is mild engorgement of the mesentery associated with this bowel loops. Oral contrast from prior study mixed with stool noted throughout the colon. There is colonic diverticulosis without active inflammatory changes. The cecum is located in the  lower abdomen in the midline anteriorly. There is areas of submucosal fat deposit along the colonic wall likely sequela of chronic inflammation. Appendectomy. Vascular/Lymphatic: There is advanced aortoiliac atherosclerotic disease. Evaluation of the vasculature is limited on this noncontrast study. The IVC appears unremarkable. No portal venous gas identified. There is no adenopathy. Reproductive: The uterus and ovaries are grossly unremarkable. Other: Small fat containing umbilical and supraumbilical hernias noted. There is mild focal protrusion of the anterior wall of the transverse colon into  the uppermost hernia. Musculoskeletal: Osteopenia with mild degenerative changes of the spine. Multilevel disc desiccation with vacuum phenomena. No acute fracture. IMPRESSION: Mildly dilated loops of small bowel in the right lower abdomen with a transition zone in the right lower quadrant concerning for an early small-bowel obstruction possibly related to underlying adhesions. A segmental ileus or related to enteritis is less likely. Clinical correlation and follow-up recommended. Colonic diverticulosis. Congenital malrotation of the bowel. Electronically Signed   By: Anner Crete M.D.   On: 02/17/2016 04:58   Dg Chest Port 1 View  Result Date: 02/17/2016 CLINICAL DATA:  76 year old female with possible aspiration. EXAM: PORTABLE CHEST 1 VIEW COMPARISON:  Chest radiograph dated 09/27/2013 FINDINGS: Single portable view of the chest does not demonstrate a focal consolidation. There is no pleural effusion or pneumothorax. There is stable mild cardiomegaly. Median sternotomy wires noted. No acute osseous pathology. IMPRESSION: No acute cardiopulmonary process. Stable cardiomegaly. Electronically Signed   By: Anner Crete M.D.   On: 02/17/2016 04:14   Dg Abd Portable 1v  Result Date: 02/17/2016 CLINICAL DATA:  NG tube placement.  Constipation. EXAM: PORTABLE ABDOMEN - 1 VIEW COMPARISON:  CT 02/17/2016.  FINDINGS: NG tube noted with tip below left hemidiaphragm. No bowel distention identified. No free air. Prior CABG. Cardiomegaly . Tiny right pleural effusion cannot be excluded . IMPRESSION: 1. NG tube noted with tip below left hemidiaphragm. No bowel distention identified. 2. Prior CABG. Cardiomegaly. Small right pleural effusion cannot be excluded . Electronically Signed   By: Marcello Moores  Register   On: 02/17/2016 07:19    EKG: Pending  Assessment/Plan Active Problems:   Essential hypertension   Hyperlipidemia   CKD (chronic kidney disease) stage 3, GFR 30-59 ml/min   SBO (small bowel obstruction)   Chronic diastolic congestive heart failure (HCC)   Diabetes mellitus with complication (HCC)   Pyuria   SBO: noted on CT abd. Noted to have congenital malrotation of the bowel. No previous abdominal surgeries. Question whether not this was initiated by a viral illness. Afebrile and vital signs are stable. Anticipate resolution with conservative management. - continue NG tube to intermittent suction - SBO study in am - IVF - will use opioid pain relievers sparingly - f/u General surgery consult.  CKD: Cr 1.7. Near baseline - IVF  DM:  - SSI - hold lantus until taking orals  Pyuria: UA results as above. Asymptomatic. Hold ABX at this time - UCX.  HTN: - continue Norvasc, lisinopril when able - Convert clonidine to patch form - IV hydralazine  GERD: - continue ppi IV  Chronic Diastolic CHF: no evidence decompensation. EF 55% w/ grade 2 diastolic dysfunction - continue lasix IV - strict I/O, Daily wts  Neuropathic/Chronic Pain: - continue neurontin when able (pt fortunately on low dose QHS).   CAD: EKG pending - continue ASA, Statin when able  DVT prophylaxis: Lovenox  Code Status: full  Family Communication: none  Disposition Plan: pending improvement in SBO  Consults called: CS  Admission status: pending improvement.     Tywan Siever J MD [facility]  If  7PM-7AM, please contact night-coverage [email or web address] Password [email or web address]  02/17/2016, 8:03 AM

## 2016-02-17 NOTE — ED Notes (Signed)
Pt assisted to restroom.  States nausea has subsided.

## 2016-02-17 NOTE — ED Provider Notes (Signed)
By signing my name below, I, Dolores Hoose, attest that this documentation has been prepared under the direction and in the presence of [place], DO . Electronically Signed: Dolores Hoose, Scribe. 02/17/2016. 1:57 AM.  TIME SEEN: 2:11AM  CHIEF COMPLAINT: Abdominal Pain  HPI:  Laura Best is a 76 y.o. female with pmhx of DM who presents to the Emergency Department complaining of sudden-onset, constant, moderate lower quadrant abdominal pain onset 8 hours ago. She describes her pain as 8/10 located across her lower abdomen and radiating around to both of her sides. Pt reports associated vomiting. She denies any diarrhea, dysuria, hematuria, vaginal bleeding or vaginal discharge. Pt has a pshx of appendectomy.   ROS: See HPI Constitutional: no fever  Eyes: no drainage  ENT: no runny nose   Cardiovascular:  no chest pain  Resp: no SOB  GI: abdominal pain. Vomiting. No diarrhea.  GU: no dysuria. No hematuria. No vaginal bleeding. No vaginal discharge.  Integumentary: no rash  Allergy: no hives  Musculoskeletal: no leg swelling  Neurological: no slurred speech ROS otherwise negative  PAST MEDICAL HISTORY/PAST SURGICAL HISTORY:  Past Medical History:  Diagnosis Date  . CKD (chronic kidney disease) stage 3, GFR 30-59 ml/min 12/25/2012  . Diabetes mellitus   . Diastolic heart failure ([place])   . Hyperlipidemia   . Hypertension   . Osteoporosis   . Osteoporosis   . Tricuspid regurgitation    Echo 05/02/06-Nml LV. Mild MR. Mod TR  . Vitamin D deficiency     MEDICATIONS:  Prior to Admission medications   Medication Sig Start Date End Date Taking? Authorizing Provider  acetaminophen (TYLENOL) 500 MG tablet Take 500 mg by mouth every 6 (six) hours as needed for pain.    Historical Provider, MD  amLODipine (NORVASC) 10 MG tablet Take 1 tablet (10 mg total) by mouth daily. 07/12/15   Lendon Colonel, NP  aspirin EC 81 MG tablet Take 81 mg by mouth daily.    Historical Provider, MD   Blood Glucose Monitoring Suppl (ONE TOUCH ULTRA SYSTEM KIT) w/Device KIT 1 meter 02/11/16   Orlena Sheldon, PA-C  Cholecalciferol (VITAMIN D) 2000 UNITS CAPS Take 1 capsule by mouth daily.    Historical Provider, MD  cloNIDine (CATAPRES) 0.2 MG tablet TAKE ONE TABLET BY MOUTH TWICE DAILY 11/10/15   Lendon Colonel, NP  furosemide (LASIX) 40 MG tablet TAKE ONE TABLET BY MOUTH ONCE DAILY 05/12/15   Orlena Sheldon, PA-C  gabapentin (NEURONTIN) 100 MG capsule Take 1-2 capsules at bedtime for neuropathy 08/27/15   Alycia Rossetti, MD  glucose blood test strip 1 each by Other route 4 (four) times daily -  before meals and at bedtime. Use as instructed 10/18/15   Orlena Sheldon, PA-C  glucose blood test strip Use as directed. Check blood sugar 3 times daily before meals and at bedtime. 02/11/16   Lonie Peak Dixon, PA-C  insulin aspart (NOVOLOG) 100 UNIT/ML FlexPen Take 7 units with luch and supper.  Inject 15 minutes prior to meal or immediately after meal. 01/21/16   Orlena Sheldon, PA-C  Lancets [facility] ULTRASOFT) lancets Use as instructed 02/11/16   Orlena Sheldon, PA-C  LEVEMIR FLEXTOUCH 100 UNIT/ML Pen INJECT 22 UNITS SUBCUTANEOUSLY ONCE DAILY AT  10  PM 11/10/15   Orlena Sheldon, PA-C  lisinopril (PRINIVIL,ZESTRIL) 2.5 MG tablet Take 2.5 mg by mouth daily.  07/02/15   Historical Provider, MD  omeprazole (PRILOSEC) 20 MG capsule TAKE  ONE CAPSULE BY MOUTH ONCE DAILY 08/16/15   Orlena Sheldon, PA-C  RELION SHORT PEN NEEDLES 31G X 8 MM MISC USE ONE PEN NEEDLE ONCE DAILY AS DIRECTED 03/10/14   Orlena Sheldon, PA-C  simvastatin (ZOCOR) 20 MG tablet TAKE ONE TABLET BY MOUTH ONCE DAILY AT BEDTIME 05/12/15   Orlena Sheldon, PA-C    ALLERGIES:  Allergies  Allergen Reactions  . Ace Inhibitors Other (See Comments)    Hyperkalemia  . Actos [Pioglitazone] Swelling  . Aspirin Other (See Comments)    G.I. Upset.   . Metformin And Related Diarrhea    SOCIAL HISTORY:  Social History  Substance Use Topics  . Smoking status: Former  Smoker    Quit date: 01/19/2009  . Smokeless tobacco: Never Used  . Alcohol use No    FAMILY HISTORY: Family History  Problem Relation Age of Onset  . Cancer Father   . Diabetes Father   . Cancer Brother     Brain  . Cancer Sister     abdominal fat    EXAM: BP 167/67 (BP Location: Right Arm)   Pulse 76   Temp 98.9 F (37.2 C) (Oral)   Resp 18   Wt 165 lb (74.8 kg)   SpO2 95%   BMI 29.23 kg/m  CONSTITUTIONAL: Elderly. Chronically ill-appearing. Alert and oriented and responds appropriately to questions. well-nourished HEAD: Normocephalic EYES: Conjunctivae clear, PERRL, EOMI ENT: normal nose; no rhinorrhea; moist mucous membranes NECK: Supple, no meningismus, no nuchal rigidity, no LAD  CARD: RRR; S1 and S2 appreciated; no murmurs, no clicks, no rubs, no gallops RESP: Normal chest excursion without splinting or tachypnea; breath sounds clear and equal bilaterally; no wheezes, no rhonchi, no rales, no hypoxia or respiratory distress, speaking full sentences ABD/GI: Diffuse abdominal tenderness with distention. Mild tympany. No fluid wave. Decreased bowel sounds; soft, non-tender, no rebound, No guarding, no peritoneal signs, no hepatosplenomegaly BACK:  The back appears normal and is non-tender to palpation, there is no CVA tenderness EXT: Normal ROM in all joints; non-tender to palpation; no edema; normal capillary refill; no cyanosis, no calf tenderness or swelling    SKIN: Normal color for age and race; warm; no rash NEURO: Moves all extremities equally, sensation to light touch intact diffusely, cranial nerves II through XII intact, normal speech PSYCH: The patient's mood and manner are appropriate. Grooming and personal hygiene are appropriate.  MEDICAL DECISION MAKING: Patient here with abdominal pain. Differential diagnosis includes small bowel obstruction, gastroenteritis, colitis, diverticulitis, UTI. Urine does show leukocytes but rare bacteria and no other sign of  infection. She is not having any urinary symptoms. We will send a urine culture. We'll hold on antibiotics at this time. Labs unremarkable. We'll proceed with CT scan for further evaluation.  ED PROGRESS: Patient's CT scan pending. She is drinking contrast currently. Feeling better after fentanyl and Zofran.   3:50 AM  D/w nursing staff.  Pt had large Volume emesis with possible aspiration. Sats dropped to the 70s. On 2 L nasal cannula and sats now in the 90s. We'll obtain chest x-ray.    6:00 AM  Pt Has a small bowel section. We will place an NG tube. Chest x-ray is clear. Patient is currently hemodynamically stable. Updated patient and her son. We'll admit to medical bed, and patient. Discussed with Dr. Hal Hope with hospitalist service who agrees on admission. He requested that we consult general surgery.   6:30 AM  D/w Dr. Hulen Skains with general surgery. They will  see the patient in consult today not emergently. Appreciate surgery help.    I reviewed all nursing notes, vitals, pertinent old records, EKGs, labs, imaging (as available).    I personally performed the services described in this documentation, which was scribed in my presence. The recorded information has been reviewed and is accurate.    [place], DO 02/17/16 [phone]

## 2016-02-17 NOTE — ED Notes (Signed)
Pt resting after watching TV.  In no distress.  NG placement verified by auscultation.  Pt no longer has feeling of nausea. MAE. A&O x 4. Denies needs at this time.

## 2016-02-18 DIAGNOSIS — N183 Chronic kidney disease, stage 3 (moderate): Secondary | ICD-10-CM

## 2016-02-18 LAB — CBC
HCT: 40.6 % (ref 36.0–46.0)
HEMOGLOBIN: 12.8 g/dL (ref 12.0–15.0)
MCH: 27.8 pg (ref 26.0–34.0)
MCHC: 31.5 g/dL (ref 30.0–36.0)
MCV: 88.1 fL (ref 78.0–100.0)
PLATELETS: 183 10*3/uL (ref 150–400)
RBC: 4.61 MIL/uL (ref 3.87–5.11)
RDW: 14.6 % (ref 11.5–15.5)
WBC: 8.1 10*3/uL (ref 4.0–10.5)

## 2016-02-18 LAB — GLUCOSE, CAPILLARY
GLUCOSE-CAPILLARY: 218 mg/dL — AB (ref 65–99)
Glucose-Capillary: 246 mg/dL — ABNORMAL HIGH (ref 65–99)
Glucose-Capillary: 222 mg/dL — ABNORMAL HIGH (ref 65–99)
Glucose-Capillary: 135 mg/dL — ABNORMAL HIGH (ref 65–99)

## 2016-02-18 LAB — BASIC METABOLIC PANEL
Anion gap: 10 (ref 5–15)
BUN: 18 mg/dL (ref 6–20)
CHLORIDE: 105 mmol/L (ref 101–111)
CO2: 28 mmol/L (ref 22–32)
CREATININE: 1.72 mg/dL — AB (ref 0.44–1.00)
Calcium: 9.1 mg/dL (ref 8.9–10.3)
GFR calc Af Amer: 32 mL/min — ABNORMAL LOW (ref >60)
GFR calc non Af Amer: 28 mL/min — ABNORMAL LOW (ref >60)
Glucose, Bld: 228 mg/dL — ABNORMAL HIGH (ref 65–99)
Potassium: 3.5 mmol/L (ref 3.5–5.1)
SODIUM: 143 mmol/L (ref 135–145)

## 2016-02-18 LAB — URINE CULTURE: CULTURE: NO GROWTH

## 2016-02-18 MED ORDER — POTASSIUM CHLORIDE CRYS ER 20 MEQ PO TBCR
60.0000 meq | EXTENDED_RELEASE_TABLET | Freq: Once | ORAL | Status: AC
  Administered 2016-02-18: 60 meq via ORAL
  Filled 2016-02-18: qty 3

## 2016-02-18 MED ORDER — AMLODIPINE BESYLATE 10 MG PO TABS
10.0000 mg | ORAL_TABLET | Freq: Every day | ORAL | Status: DC
  Administered 2016-02-18 – 2016-02-19 (×2): 10 mg via ORAL
  Filled 2016-02-18 (×2): qty 1

## 2016-02-18 NOTE — Progress Notes (Signed)
   02/18/16 0930  Clinical Encounter Type  Visited With Patient  Visit Type Other (Comment) ([place] consult)  Spiritual Encounters  Spiritual Needs Prayer  Stress Factors  Patient Stress Factors None identified  Introduction to Pt. Offered prayer for hope and comfort.

## 2016-02-18 NOTE — Progress Notes (Signed)
PROGRESS NOTE  Laura Best  YTK:[id number] DOB: 08-07-1940 DOA: 02/17/2016 PCP: [name], PA-C Outpatient Specialists:  Subjective: Patient reported feeling much better, passing gas. X-ray repeated this morning and showed Gastrografin in the colon, NGT to be removed.  Brief Narrative:  Laura Best is a 76 y.o. female with medical history significant of  CKD, CHF, HLD, HTN,  Presenting w/ sudden onset abd pain. LLQ, started 1 day ago. Radiates across abdomen, associated w/ bilious emesis. Getting worse. Patient states subjectively that she feels that her abdomen is getting more swollen. Denies fevers, chest pain, shortness breath, palpitations, dysuria, frequency, back pain, rash, neck stiffness headache, LOC, dizziness, vertigo, lower extremity swelling.  Assessment & Plan:   Active Problems:   Essential hypertension   Hyperlipidemia   CKD (chronic kidney disease) stage 3, GFR 30-59 ml/min   SBO (small bowel obstruction)   Chronic diastolic congestive heart failure (HCC)   Diabetes mellitus with complication (HCC)   Pyuria   SBO -Noted on CT abd. Noted to have congenital malrotation of the bowel.  -NGT placed in the ED, this is removed as the contrast medium reached the colon. -Gen. surgery on board.  CKD stage III: Cr 1.7. Near baseline -Discontinue IV fluids as she was started on clear liquids  DM:  - SSI - hold lantus until taking orals  Pyuria: UA results as above. Asymptomatic. Hold ABX at this time -Urine culture showed no growth.  HTN: - continue Norvasc, lisinopril when able - Convert clonidine to patch form - IV hydralazine  GERD: - continue ppi IV  Chronic Diastolic CHF: no evidence decompensation. EF 55% w/ grade 2 diastolic dysfunction - continue lasix IV - strict I/O, Daily wts  Neuropathic/Chronic Pain: - continue neurontin when able (pt fortunately on low dose QHS).   CAD: EKG pending - continue ASA, Statin when able   DVT  prophylaxis: Lovenox Code Status: Full Code Family Communication:  Disposition Plan:  Diet: Diet clear liquid Room service appropriate? Yes; Fluid consistency: Thin  Consultants:   None  Procedures:   NGT placement and removal  Antimicrobials:   None  Objective: Vitals:   02/17/16 1710 02/17/16 1710 02/17/16 2134 02/18/16 0640  BP:  (!) 167/59 (!) 178/73 (!) 180/65  Pulse:  88 90 92  Resp:  18 18 18   Temp:   99 F (37.2 C) 99.6 F (37.6 C)  TempSrc:      SpO2:  92% 92% 91%  Weight: 77.7 kg (171 lb 4.8 oz)     Height: 5\' 3"  (1.6 m)       Intake/Output Summary (Last 24 hours) at 02/18/16 1357 Last data filed at 02/18/16 1024  Gross per 24 hour  Intake          3221.67 ml  Output             2350 ml  Net           871.67 ml   Filed Weights   02/16/16 2114 02/17/16 1710  Weight: 74.8 kg (165 lb) 77.7 kg (171 lb 4.8 oz)    Examination: General exam: Appears calm and comfortable  Respiratory system: Clear to auscultation. Respiratory effort normal. Cardiovascular system: S1 & S2 heard, RRR. No JVD, murmurs, rubs, gallops or clicks. No pedal edema. Gastrointestinal system: Abdomen is nondistended, soft and nontender. No organomegaly or masses felt. Normal bowel sounds heard. Central nervous system: Alert and oriented. No focal neurological deficits. Extremities: Symmetric 5 x 5 power. Skin:  No rashes, lesions or ulcers Psychiatry: Judgement and insight appear normal. Mood & affect appropriate.   Data Reviewed: I have personally reviewed following labs and imaging studies  CBC:  Recent Labs Lab 02/16/16 2115 02/18/16 0649  WBC 10.5 8.1  HGB 12.7 12.8  HCT 38.7 40.6  MCV 85.4 88.1  PLT 201 211   Basic Metabolic Panel:  Recent Labs Lab 02/16/16 2115 02/18/16 0649  NA 139 143  K 3.8 3.5  CL 103 105  CO2 23 28  GLUCOSE 105* 228*  BUN 29* 18  CREATININE 1.79* 1.72*  CALCIUM 9.4 9.1   GFR: Estimated Creatinine Clearance: 27.9 mL/min (by C-G  formula based on SCr of 1.72 mg/dL (H)). Liver Function Tests:  Recent Labs Lab 02/16/16 2115  AST 22  ALT 17  ALKPHOS 89  BILITOT 0.6  PROT 7.6  ALBUMIN 4.1    Recent Labs Lab 02/16/16 2115  LIPASE 27   No results for input(s): AMMONIA in the last 168 hours. Coagulation Profile: No results for input(s): INR, PROTIME in the last 168 hours. Cardiac Enzymes: No results for input(s): CKTOTAL, CKMB, CKMBINDEX, TROPONINI in the last 168 hours. BNP (last 3 results) No results for input(s): PROBNP in the last 8760 hours. HbA1C: No results for input(s): HGBA1C in the last 72 hours. CBG:  Recent Labs Lab 02/17/16 1149 02/17/16 1708 02/17/16 2131 02/18/16 0754 02/18/16 1157  GLUCAP 214* 173* 186* 218* 246*   Lipid Profile: No results for input(s): CHOL, HDL, LDLCALC, TRIG, CHOLHDL, LDLDIRECT in the last 72 hours. Thyroid Function Tests: No results for input(s): TSH, T4TOTAL, FREET4, T3FREE, THYROIDAB in the last 72 hours. Anemia Panel: No results for input(s): VITAMINB12, FOLATE, FERRITIN, TIBC, IRON, RETICCTPCT in the last 72 hours. Urine analysis:    Component Value Date/Time   COLORURINE YELLOW 02/16/2016 2120   APPEARANCEUR CLEAR 02/16/2016 2120   LABSPEC 1.015 02/16/2016 2120   PHURINE 5.0 02/16/2016 2120   GLUCOSEU NEGATIVE 02/16/2016 2120   HGBUR NEGATIVE 02/16/2016 2120   BILIRUBINUR NEGATIVE 02/16/2016 2120   [place] 02/16/2016 2120   PROTEINUR 30 (A) 02/16/2016 2120   UROBILINOGEN 0.2 05/03/2014 0645   NITRITE NEGATIVE 02/16/2016 2120   LEUKOCYTESUR LARGE (A) 02/16/2016 2120   Sepsis Labs: @LABRCNTIP (procalcitonin:4,lacticidven:4)  ) Recent Results (from the past 240 hour(s))  Urine culture     Status: None   Collection Time: 02/16/16  9:20 PM  Result Value Ref Range Status   Specimen Description URINE, RANDOM  Final   Special Requests NONE  Final   Culture NO GROWTH  Final   Report Status 02/18/2016 FINAL  Final     Invalid  input(s): PROCALCITONIN, LACTICACIDVEN   Radiology Studies: Ct Abdomen Pelvis Wo Contrast  Result Date: 02/17/2016 CLINICAL DATA:  76 year old female with abdominal pain and vomiting. EXAM: CT ABDOMEN AND PELVIS WITHOUT CONTRAST TECHNIQUE: Multidetector CT imaging of the abdomen and pelvis was performed following the standard protocol without IV contrast. COMPARISON:  CT of the abdomen pelvis 02/08/2016. FINDINGS: Evaluation of this exam is limited in the absence of intravenous contrast. Lower chest: Minimal linear bibasilar atelectasis/scarring noted. The visualized lung bases are otherwise clear. No intra-abdominal free air or free fluid. Hepatobiliary: No focal liver abnormality is seen. No gallstones, gallbladder wall thickening, or biliary dilatation. Pancreas: Unremarkable. No pancreatic ductal dilatation or surrounding inflammatory changes. Spleen: Normal in size without focal abnormality. Adrenals/Urinary Tract: The adrenal glands appear unremarkable. Vascular calcification versus less likely punctate nonobstructing right renal interpolar stone. There is  no hydronephrosis or obstructing stone on either side. Focal area of scarring noted along the posterior cortex of the superior pole of the right kidney. The visualized ureters and urinary bladder appear unremarkable. Stomach/Bowel: There is congenital malrotation of the bowel. There is a dilated loop of small bowel in the right lower abdomen with associated mild inflammatory changes. There is abutment of loops of small bowel in this region which may represent adhesion. The adjacent more distal segments of the bowel appears collapsed. A transition zone is seen in the right lower quadrant (series 2, image 52 and coronal image 53). There is mild engorgement of the mesentery associated with this bowel loops. Oral contrast from prior study mixed with stool noted throughout the colon. There is colonic diverticulosis without active inflammatory changes. The  cecum is located in the lower abdomen in the midline anteriorly. There is areas of submucosal fat deposit along the colonic wall likely sequela of chronic inflammation. Appendectomy. Vascular/Lymphatic: There is advanced aortoiliac atherosclerotic disease. Evaluation of the vasculature is limited on this noncontrast study. The IVC appears unremarkable. No portal venous gas identified. There is no adenopathy. Reproductive: The uterus and ovaries are grossly unremarkable. Other: Small fat containing umbilical and supraumbilical hernias noted. There is mild focal protrusion of the anterior wall of the transverse colon into the uppermost hernia. Musculoskeletal: Osteopenia with mild degenerative changes of the spine. Multilevel disc desiccation with vacuum phenomena. No acute fracture. IMPRESSION: Mildly dilated loops of small bowel in the right lower abdomen with a transition zone in the right lower quadrant concerning for an early small-bowel obstruction possibly related to underlying adhesions. A segmental ileus or related to enteritis is less likely. Clinical correlation and follow-up recommended. Colonic diverticulosis. Congenital malrotation of the bowel. Electronically Signed   By: Anner Crete M.D.   On: 02/17/2016 04:58   Dg Chest Port 1 View  Result Date: 02/17/2016 CLINICAL DATA:  76 year old female with possible aspiration. EXAM: PORTABLE CHEST 1 VIEW COMPARISON:  Chest radiograph dated 09/27/2013 FINDINGS: Single portable view of the chest does not demonstrate a focal consolidation. There is no pleural effusion or pneumothorax. There is stable mild cardiomegaly. Median sternotomy wires noted. No acute osseous pathology. IMPRESSION: No acute cardiopulmonary process. Stable cardiomegaly. Electronically Signed   By: Anner Crete M.D.   On: 02/17/2016 04:14   Dg Abd Portable 1v-small Bowel Obstruction Protocol-initial, 8 Hr Delay  Result Date: 02/18/2016 CLINICAL DATA:  76 year old female with  small bowel obstruction. EXAM: PORTABLE ABDOMEN - 1 VIEW COMPARISON:  Abdominal radiograph dated 02/17/2016 FINDINGS: An enteric tube is partially visualized with tip and side-port in the proximal stomach. The side port of the tube is approximately 2.5 cm distal to the GE junction. No bowel dilatation or evidence of obstruction. Oral contrast from prior CT of 02/17/2016 is now within the proximal colon. Residual old contrast from CT of 02/08/2016 is seen in the rectosigmoid. There is no free air. There is degenerative changes of the spine. No acute osseous pathology. IMPRESSION: Enteric tube in the proximal stomach. No bowel dilatation. Interval passage of oral contrast from CT of 02/17/2016 into the colon. Electronically Signed   By: Anner Crete M.D.   On: 02/18/2016 00:29   Dg Abd Portable 1v  Result Date: 02/17/2016 CLINICAL DATA:  NG tube placement.  Constipation. EXAM: PORTABLE ABDOMEN - 1 VIEW COMPARISON:  CT 02/17/2016. FINDINGS: NG tube noted with tip below left hemidiaphragm. No bowel distention identified. No free air. Prior CABG. Cardiomegaly . Tiny right  pleural effusion cannot be excluded . IMPRESSION: 1. NG tube noted with tip below left hemidiaphragm. No bowel distention identified. 2. Prior CABG. Cardiomegaly. Small right pleural effusion cannot be excluded . Electronically Signed   By: Martin   On: 02/17/2016 07:19        Scheduled Meds: . cloNIDine  0.2 mg Transdermal Weekly  . enoxaparin (LOVENOX) injection  30 mg Subcutaneous Q24H  . furosemide  20 mg Intravenous Daily  . insulin aspart  0-15 Units Subcutaneous TID WC  . insulin aspart  0-5 Units Subcutaneous QHS  . pantoprazole (PROTONIX) IV  40 mg Intravenous Q24H   Continuous Infusions: . sodium chloride 1,000 mL (02/17/16 0310)  . dextrose 5 % and 0.45% NaCl 50 mL/hr at 02/18/16 0718     LOS: 1 day    Time spent: 35 minutes    Laura Best A, MD [facility] Pager [phone]  If  7PM-7AM, please contact night-coverage [email or web address] Password [email or web address] 02/18/2016, 1:57 PM

## 2016-02-18 NOTE — Progress Notes (Signed)
Inpatient Diabetes Program Recommendations  AACE/ADA: New Consensus Statement on Inpatient Glycemic Control (2015)  Target Ranges:  Prepandial:   less than 140 mg/dL      Peak postprandial:   less than 180 mg/dL (1-2 hours)      Critically ill patients:  140 - 180 mg/dL   Lab Results  Component Value Date   GLUCAP 218 (H) 02/18/2016   HGBA1C 7.0 (H) 01/20/2016    Review of Glycemic Control:  Results for OMAH, DEWALT (MRN [id number]) as of 02/18/2016 10:49  Ref. Range 02/17/2016 09:39 02/17/2016 11:49 02/17/2016 17:08 02/17/2016 21:31 02/18/2016 07:54  Glucose-Capillary Latest Ref Range: 65 - 99 mg/dL 244 (H) 214 (H) 173 (H) 186 (H) 218 (H)    Diabetes history: Type 2 diabetes Outpatient Diabetes medications: Levemir 22 units q HS, Novolog 7 units bid Current orders for Inpatient glycemic control: Novolog moderate tid with meals and HS  Inpatient Diabetes Program Recommendations:    Please consider restarting Levemir 22 units daily.  Text page sent to MD.  Thanks, Adah Perl, RN, BC-ADM Inpatient Diabetes Coordinator Pager [phone] (8a-5p)

## 2016-02-18 NOTE — Progress Notes (Signed)
[facility] Progress Note     Subjective: Pt feeling better. No abdominal pain. Bloating is better. Having flatus. No nausea. No events overnight.  Objective: Vital signs in last 24 hours: Temp:  [99 F (37.2 C)-99.6 F (37.6 C)] 99.6 F (37.6 C) (01/26 0640) Pulse Rate:  [81-92] 92 (01/26 0640) Resp:  [11-21] 18 (01/26 0640) BP: (158-180)/(57-73) 180/65 (01/26 0640) SpO2:  [88 %-92 %] 91 % (01/26 0640) Weight:  [171 lb 4.8 oz (77.7 kg)] 171 lb 4.8 oz (77.7 kg) (01/25 1710) Last BM Date: 02/16/16  Intake/Output from previous day: 01/25 0701 - 01/26 0700 In: 2741.7 [I.V.:2741.7] Out: 2000 [Urine:750; Emesis/NG output:1250] Intake/Output this shift: No intake/output data recorded.  PE: Gen:  Alert, NAD, pleasant, cooperative, elderly woman lying in bed Card:  RRR, no M/G/R heard Pulm:  raet and effort normal Abd: Soft, distended, normal BS, No TTP Skin: not diaphoretic, warm and dry  Lab Results:   Recent Labs  02/16/16 2115 02/18/16 0649  WBC 10.5 8.1  HGB 12.7 12.8  HCT 38.7 40.6  PLT 201 183   BMET  Recent Labs  02/16/16 2115 02/18/16 0649  NA 139 143  K 3.8 3.5  CL 103 105  CO2 23 28  GLUCOSE 105* 228*  BUN 29* 18  CREATININE 1.79* 1.72*  CALCIUM 9.4 9.1   PT/INR No results for input(s): LABPROT, INR in the last 72 hours. CMP     Component Value Date/Time   NA 143 02/18/2016 0649   K 3.5 02/18/2016 0649   CL 105 02/18/2016 0649   CO2 28 02/18/2016 0649   GLUCOSE 228 (H) 02/18/2016 0649   GLUCOSE 163 (H) 07/19/2012 1005   BUN 18 02/18/2016 0649   CREATININE 1.72 (H) 02/18/2016 0649   CREATININE 1.54 (H) 01/20/2016 1202   CALCIUM 9.1 02/18/2016 0649   PROT 7.6 02/16/2016 2115   ALBUMIN 4.1 02/16/2016 2115   AST 22 02/16/2016 2115   ALT 17 02/16/2016 2115   ALKPHOS 89 02/16/2016 2115   BILITOT 0.6 02/16/2016 2115   GFRNONAA 28 (L) 02/18/2016 0649   GFRNONAA 33 (L) 01/20/2016 1202   GFRAA 32 (L) 02/18/2016 0649   GFRAA 38  (L) 01/20/2016 1202   Lipase     Component Value Date/Time   LIPASE 27 02/16/2016 2115       Studies/Results: Ct Abdomen Pelvis Wo Contrast  Result Date: 02/17/2016 CLINICAL DATA:  76 year old female with abdominal pain and vomiting. EXAM: CT ABDOMEN AND PELVIS WITHOUT CONTRAST TECHNIQUE: Multidetector CT imaging of the abdomen and pelvis was performed following the standard protocol without IV contrast. COMPARISON:  CT of the abdomen pelvis 02/08/2016. FINDINGS: Evaluation of this exam is limited in the absence of intravenous contrast. Lower chest: Minimal linear bibasilar atelectasis/scarring noted. The visualized lung bases are otherwise clear. No intra-abdominal free air or free fluid. Hepatobiliary: No focal liver abnormality is seen. No gallstones, gallbladder wall thickening, or biliary dilatation. Pancreas: Unremarkable. No pancreatic ductal dilatation or surrounding inflammatory changes. Spleen: Normal in size without focal abnormality. Adrenals/Urinary Tract: The adrenal glands appear unremarkable. Vascular calcification versus less likely punctate nonobstructing right renal interpolar stone. There is no hydronephrosis or obstructing stone on either side. Focal area of scarring noted along the posterior cortex of the superior pole of the right kidney. The visualized ureters and urinary bladder appear unremarkable. Stomach/Bowel: There is congenital malrotation of the bowel. There is a dilated loop of small bowel in the right lower abdomen with associated mild inflammatory  changes. There is abutment of loops of small bowel in this region which may represent adhesion. The adjacent more distal segments of the bowel appears collapsed. A transition zone is seen in the right lower quadrant (series 2, image 52 and coronal image 53). There is mild engorgement of the mesentery associated with this bowel loops. Oral contrast from prior study mixed with stool noted throughout the colon. There is colonic  diverticulosis without active inflammatory changes. The cecum is located in the lower abdomen in the midline anteriorly. There is areas of submucosal fat deposit along the colonic wall likely sequela of chronic inflammation. Appendectomy. Vascular/Lymphatic: There is advanced aortoiliac atherosclerotic disease. Evaluation of the vasculature is limited on this noncontrast study. The IVC appears unremarkable. No portal venous gas identified. There is no adenopathy. Reproductive: The uterus and ovaries are grossly unremarkable. Other: Small fat containing umbilical and supraumbilical hernias noted. There is mild focal protrusion of the anterior wall of the transverse colon into the uppermost hernia. Musculoskeletal: Osteopenia with mild degenerative changes of the spine. Multilevel disc desiccation with vacuum phenomena. No acute fracture. IMPRESSION: Mildly dilated loops of small bowel in the right lower abdomen with a transition zone in the right lower quadrant concerning for an early small-bowel obstruction possibly related to underlying adhesions. A segmental ileus or related to enteritis is less likely. Clinical correlation and follow-up recommended. Colonic diverticulosis. Congenital malrotation of the bowel. Electronically Signed   By: Anner Crete M.D.   On: 02/17/2016 04:58   Dg Chest Port 1 View  Result Date: 02/17/2016 CLINICAL DATA:  76 year old female with possible aspiration. EXAM: PORTABLE CHEST 1 VIEW COMPARISON:  Chest radiograph dated 09/27/2013 FINDINGS: Single portable view of the chest does not demonstrate a focal consolidation. There is no pleural effusion or pneumothorax. There is stable mild cardiomegaly. Median sternotomy wires noted. No acute osseous pathology. IMPRESSION: No acute cardiopulmonary process. Stable cardiomegaly. Electronically Signed   By: Anner Crete M.D.   On: 02/17/2016 04:14   Dg Abd Portable 1v-small Bowel Obstruction Protocol-initial, 8 Hr Delay  Result  Date: 02/18/2016 CLINICAL DATA:  76 year old female with small bowel obstruction. EXAM: PORTABLE ABDOMEN - 1 VIEW COMPARISON:  Abdominal radiograph dated 02/17/2016 FINDINGS: An enteric tube is partially visualized with tip and side-port in the proximal stomach. The side port of the tube is approximately 2.5 cm distal to the GE junction. No bowel dilatation or evidence of obstruction. Oral contrast from prior CT of 02/17/2016 is now within the proximal colon. Residual old contrast from CT of 02/08/2016 is seen in the rectosigmoid. There is no free air. There is degenerative changes of the spine. No acute osseous pathology. IMPRESSION: Enteric tube in the proximal stomach. No bowel dilatation. Interval passage of oral contrast from CT of 02/17/2016 into the colon. Electronically Signed   By: Anner Crete M.D.   On: 02/18/2016 00:29   Dg Abd Portable 1v  Result Date: 02/17/2016 CLINICAL DATA:  NG tube placement.  Constipation. EXAM: PORTABLE ABDOMEN - 1 VIEW COMPARISON:  CT 02/17/2016. FINDINGS: NG tube noted with tip below left hemidiaphragm. No bowel distention identified. No free air. Prior CABG. Cardiomegaly . Tiny right pleural effusion cannot be excluded . IMPRESSION: 1. NG tube noted with tip below left hemidiaphragm. No bowel distention identified. 2. Prior CABG. Cardiomegaly. Small right pleural effusion cannot be excluded . Electronically Signed   By: Marcello Moores  Register   On: 02/17/2016 07:19    Anti-infectives: Anti-infectives    None  Assessment/Plan  SBO - Hx lap converted to open appendectomy, tubal ligation - Small bowel protocol - abd film this morning showed contrast in the colon - DC NGT, clears   FEN: clears, IVF ID: none  VTE: SCD's & lovenox  Plan: No acute surgical needs. Contrast seen in colon on abd film. Will pull NGT and advance to clears. Advance diet as tolerated. We will continue to follow.   LOS: 1 day    Kalman Drape , [facility] 02/18/2016, 8:21 AM Pager: [phone] Consults: [phone] Mon-Fri 7:00 am-4:30 pm Sat-Sun 7:00 am-11:30 am

## 2016-02-19 DIAGNOSIS — K56609 Unspecified intestinal obstruction, unspecified as to partial versus complete obstruction: Principal | ICD-10-CM

## 2016-02-19 LAB — BASIC METABOLIC PANEL
Anion gap: 9 (ref 5–15)
BUN: 15 mg/dL (ref 6–20)
CALCIUM: 8.8 mg/dL — AB (ref 8.9–10.3)
CHLORIDE: 103 mmol/L (ref 101–111)
CO2: 27 mmol/L (ref 22–32)
Creatinine, Ser: 1.66 mg/dL — ABNORMAL HIGH (ref 0.44–1.00)
GFR calc Af Amer: 34 mL/min — ABNORMAL LOW (ref >60)
GFR calc non Af Amer: 29 mL/min — ABNORMAL LOW (ref >60)
GLUCOSE: 178 mg/dL — AB (ref 65–99)
Potassium: 4.2 mmol/L (ref 3.5–5.1)
Sodium: 139 mmol/L (ref 135–145)

## 2016-02-19 LAB — CBC
HCT: 36.8 % (ref 36.0–46.0)
Hemoglobin: 11.6 g/dL — ABNORMAL LOW (ref 12.0–15.0)
MCH: 27.7 pg (ref 26.0–34.0)
MCHC: 31.5 g/dL (ref 30.0–36.0)
MCV: 87.8 fL (ref 78.0–100.0)
PLATELETS: 175 10*3/uL (ref 150–400)
RBC: 4.19 MIL/uL (ref 3.87–5.11)
RDW: 14.3 % (ref 11.5–15.5)
WBC: 7.9 10*3/uL (ref 4.0–10.5)

## 2016-02-19 LAB — GLUCOSE, CAPILLARY
GLUCOSE-CAPILLARY: 161 mg/dL — AB (ref 65–99)
Glucose-Capillary: 363 mg/dL — ABNORMAL HIGH (ref 65–99)

## 2016-02-19 MED ORDER — PSYLLIUM 95 % PO PACK
1.0000 | PACK | Freq: Three times a day (TID) | ORAL | Status: DC
  Filled 2016-02-19 (×2): qty 1

## 2016-02-19 MED ORDER — POLYETHYLENE GLYCOL 3350 17 G PO PACK
17.0000 g | PACK | Freq: Every day | ORAL | Status: DC
  Administered 2016-02-19: 17 g via ORAL
  Filled 2016-02-19: qty 1

## 2016-02-19 MED ORDER — PANTOPRAZOLE SODIUM 40 MG PO TBEC
40.0000 mg | DELAYED_RELEASE_TABLET | Freq: Every day | ORAL | Status: DC
  Administered 2016-02-19: 40 mg via ORAL
  Filled 2016-02-19: qty 1

## 2016-02-19 MED ORDER — POLYETHYLENE GLYCOL 3350 17 G PO PACK: 17.0000 g | PACK | Freq: Every day | ORAL | 0 refills | Status: DC

## 2016-02-19 MED ORDER — ASPIRIN EC 81 MG PO TBEC
81.0000 mg | DELAYED_RELEASE_TABLET | Freq: Every day | ORAL | Status: DC
  Administered 2016-02-19: 81 mg via ORAL

## 2016-02-19 MED ORDER — CLONIDINE HCL 0.2 MG PO TABS
0.2000 mg | ORAL_TABLET | Freq: Two times a day (BID) | ORAL | Status: DC
  Administered 2016-02-19: 0.2 mg via ORAL
  Filled 2016-02-19: qty 1

## 2016-02-19 MED ORDER — PSYLLIUM 95 % PO PACK: 1.0000 | PACK | Freq: Three times a day (TID) | ORAL | 0 refills | Status: DC

## 2016-02-19 MED ORDER — LISINOPRIL 5 MG PO TABS
2.5000 mg | ORAL_TABLET | Freq: Every day | ORAL | Status: DC
Start: 2016-02-19 — End: 2016-02-19
  Administered 2016-02-19: 2.5 mg via ORAL
  Filled 2016-02-19: qty 1

## 2016-02-19 NOTE — Progress Notes (Signed)
Nsg Discharge Note  Admit Date:  02/17/2016 Discharge date: 02/19/2016   LAURENASHLEY VIAR to be D/C'd Home per MD order.  AVS completed.  Copy for chart, and copy for patient signed, and dated. Patient/caregiver able to verbalize understanding.  Discharge Medication: Allergies as of 02/19/2016      Reactions   Ace Inhibitors Other (See Comments)   Hyperkalemia   Actos [pioglitazone] Swelling   Aspirin Other (See Comments)   G.I. Upset.    Metformin And Related Diarrhea      Medication List    TAKE these medications   acetaminophen 500 MG tablet Commonly known as:  TYLENOL Take 500 mg by mouth every 6 (six) hours as needed for pain.   amLODipine 10 MG tablet Commonly known as:  NORVASC Take 1 tablet (10 mg total) by mouth daily.   aspirin EC 81 MG tablet Take 81 mg by mouth daily.   cloNIDine 0.2 MG tablet Commonly known as:  CATAPRES TAKE ONE TABLET BY MOUTH TWICE DAILY   furosemide 40 MG tablet Commonly known as:  LASIX TAKE ONE TABLET BY MOUTH ONCE DAILY   gabapentin 100 MG capsule Commonly known as:  NEURONTIN Take 1-2 capsules at bedtime for neuropathy What changed:  how much to take  how to take this  when to take this  additional instructions   glucose blood test strip 1 each by Other route 4 (four) times daily -  before meals and at bedtime. Use as instructed   glucose blood test strip Use as directed. Check blood sugar 3 times daily before meals and at bedtime.   insulin aspart 100 UNIT/ML FlexPen Commonly known as:  NOVOLOG Take 7 units with luch and supper.  Inject 15 minutes prior to meal or immediately after meal.   LEVEMIR FLEXTOUCH 100 UNIT/ML Pen Generic drug:  Insulin Detemir INJECT 22 UNITS SUBCUTANEOUSLY ONCE DAILY AT  10  PM   lisinopril 2.5 MG tablet Commonly known as:  PRINIVIL,ZESTRIL Take 2.5 mg by mouth daily.   omeprazole 20 MG capsule Commonly known as:  PRILOSEC TAKE ONE CAPSULE BY MOUTH ONCE DAILY   ONE TOUCH ULTRA  SYSTEM KIT w/Device Kit 1 meter   onetouch ultrasoft lancets Use as instructed   polyethylene glycol packet Commonly known as:  MIRALAX / GLYCOLAX Take 17 g by mouth daily. Start taking on:  02/20/2016   psyllium 95 % Pack Commonly known as:  HYDROCIL/METAMUCIL Take 1 packet by mouth 3 (three) times daily.   RELION SHORT PEN NEEDLES 31G X 8 MM Misc Generic drug:  Insulin Pen Needle USE ONE PEN NEEDLE ONCE DAILY AS DIRECTED   simvastatin 20 MG tablet Commonly known as:  ZOCOR TAKE ONE TABLET BY MOUTH ONCE DAILY AT BEDTIME   Vitamin D 2000 units Caps Take 1 capsule by mouth daily.       Discharge Assessment: Vitals:   02/19/16 0501 02/19/16 0503  BP: (!) 137/49 (!) 153/59  Pulse: 76 76  Resp: 18   Temp: 98.4 F (36.9 C)    Skin clean, dry and intact without evidence of skin break down, no evidence of skin tears noted. IV catheter discontinued intact. Site without signs and symptoms of complications - no redness or edema noted at insertion site, patient denies c/o pain - only slight tenderness at site.  Dressing with slight pressure applied.  D/c Instructions-Education: Discharge instructions given to patient/family with verbalized understanding. D/c education completed with patient/family including follow up instructions, medication list, d/c activities  limitations if indicated, with other d/c instructions as indicated by MD - patient able to verbalize understanding, all questions fully answered. Patient instructed to return to ED, call 911, or call MD for any changes in condition.  Patient escorted via [place], and D/C home via private auto with son.   Salley Slaughter, RN 02/19/2016 3:17 PM

## 2016-02-19 NOTE — Progress Notes (Signed)
  Progress Note: General Surgery Service   Subjective: Tolerated clears, +flatus, no BM, no abdominal pain, chronic constipation  Objective: Vital signs in last 24 hours: Temp:  [98.4 F (36.9 C)-99 F (37.2 C)] 98.4 F (36.9 C) (01/27 0501) Pulse Rate:  [76-84] 76 (01/27 0503) Resp:  [18] 18 (01/27 0501) BP: (137-162)/(49-59) 153/59 (01/27 0503) SpO2:  [91 %-96 %] 96 % (01/27 0503) Weight:  [77.9 kg (171 lb 11.8 oz)] 77.9 kg (171 lb 11.8 oz) (01/27 0503) Last BM Date: 02/16/16  Intake/Output from previous day: 01/26 0701 - 01/27 0700 In: 480 [P.O.:480] Out: 350 [Urine:350] Intake/Output this shift: No intake/output data recorded.  Lungs: CATB  Cardiovascular: RRR  Abd: with obesity, nontender  Extremities: no edema  Neuro: GCS 15  Lab Results: CBC   Recent Labs  02/18/16 0649 02/19/16 0545  WBC 8.1 7.9  HGB 12.8 11.6*  HCT 40.6 36.8  PLT 183 175   BMET  Recent Labs  02/18/16 0649 02/19/16 0545  NA 143 139  K 3.5 4.2  CL 105 103  CO2 28 27  GLUCOSE 228* 178*  BUN 18 15  CREATININE 1.72* 1.66*  CALCIUM 9.1 8.8*   PT/INR No results for input(s): LABPROT, INR in the last 72 hours. ABG No results for input(s): PHART, HCO3 in the last 72 hours.  Invalid input(s): PCO2, PO2  Studies/Results:  Anti-infectives: Anti-infectives    None      Medications: Scheduled Meds: . amLODipine  10 mg Oral Daily  . cloNIDine  0.2 mg Transdermal Weekly  . enoxaparin (LOVENOX) injection  30 mg Subcutaneous Q24H  . insulin aspart  0-15 Units Subcutaneous TID WC  . insulin aspart  0-5 Units Subcutaneous QHS  . pantoprazole (PROTONIX) IV  40 mg Intravenous Q24H   Continuous Infusions: PRN Meds:.acetaminophen **OR** acetaminophen, hydrALAZINE, ondansetron **OR** ondansetron (ZOFRAN) IV  Assessment/Plan: Patient Active Problem List   Diagnosis Date Noted  . SBO (small bowel obstruction) 02/17/2016  . Chronic diastolic congestive heart failure ([place])  02/17/2016  . Diabetes mellitus with complication ([place]) 01/15/8249  . Pyuria 02/17/2016  . GERD (gastroesophageal reflux disease) 04/05/2015  . Diastolic dysfunction 03/70/4888  . Acute diverticulitis 05/03/2014  . Acute appendicitis with peritoneal abscess 09/27/2013  . Bradycardia 04/02/2013  . Hyperkalemia 01/22/2013  . Acute renal failure ([place]) 01/21/2013  . Anemia of chronic disease 01/21/2013  . Acute bronchitis 01/20/2013  . CHF exacerbation ([place]) 01/19/2013  . CKD (chronic kidney disease) stage 3, GFR 30-59 ml/min 12/25/2012  . Screening for colorectal cancer 12/25/2012  . Breast cancer screening 12/25/2012  . Vitamin D deficiency   . Osteoporosis   . Postop check 08/13/2012  . Appendiceal abscess 07/30/2012  . Fever, unspecified 07/19/2012  . Abdominal pain, other specified site 07/19/2012  . SOB (shortness of breath) 07/19/2012  . ARF (acute renal failure) ([place]) 07/19/2012  . Abscess, appendix 07/19/2012  . Acute diastolic heart failure ([place]) 02/04/2011  . Type 2 diabetes mellitus with renal manifestations ([place]) 02/02/2011  . Pulmonary hypertension 02/02/2011  . COPD exacerbation ([place]) 02/02/2011  . Hypokalemia 02/02/2011  . Normocytic anemia 02/02/2011  . Essential hypertension   . Hyperlipidemia    SBO resolved -advance to regular diet -unlikely to require surgery -recommend daily miralax and 1 tbsp metamucil 3x daily when discharged for chronic constipation    LOS: 2 days   Mickeal Skinner, MD Pg# [phone] [facility]

## 2016-02-19 NOTE — Discharge Summary (Signed)
[facility] Discharge Summary   Patient: Laura Best XBJ:[id number]   PCP: [name], PA-C DOB: Jan 22, 1941   Date of admission: 02/17/2016   Date of discharge: 02/19/2016     Discharge Diagnoses:  Active Problems:   Essential hypertension   Hyperlipidemia   CKD (chronic kidney disease) stage 3, GFR 30-59 ml/min   SBO (small bowel obstruction)   Chronic diastolic congestive heart failure ([facility])   Diabetes mellitus with complication ([place])   Pyuria   Admitted From: home Disposition:  home  Recommendations for Outpatient Follow-up:  1. Please follow up with PCP in 1 week    Diet recommendation: soft diet  Activity: The patient is advised to gradually reintroduce usual activities.  Discharge Condition: good  Code Status: full code  History of present illness: As per the H and P dictated on admission, "Laura Best is a 76 y.o. female with medical history significant of  CKD, CHF, HLD, HTN,  Presenting w/ sudden onset abd pain. LLQ, started 1 day ago. Radiates across abdomen, associated w/ bilious emesis. Getting worse. Patient states subjectively that she feels that her abdomen is getting more swollen. Denies fevers, chest pain, shortness breath, palpitations, dysuria, frequency, back pain, rash, neck stiffness headache, LOC, dizziness, vertigo, lower extremity swelling. "  Hospital Course:   Summary of her active problems in the hospital is as following. SBO -Noted on CT abd. Noted to have congenital malrotation of the bowel.  -NGT placed in the ED, this is removed as the contrast medium reached the colon. -Gen. surgery on board. Py was able to tolerate oral diet and did not have any nausea or abdominal pain  CKD stage III: Cr 1.7. Near baseline  DM:  Resume home regimen.   Pyuria: UA results as above. Asymptomatic. Hold ABX at this time -Urine culture showed no growth.  HTN: - continue Norvasc, lisinopril and other home medication  GERD: -  continue ppi  Chronic Diastolic CHF: no evidence decompensation. EF 55% w/ grade 2 diastolic dysfunction - continue home regimen   Neuropathic/Chronic Pain: - continue neurontin   CAD: EKG pending - continue ASA, Statin  All other chronic medical condition were stable during the hospitalization.  Patient was ambulatory without any assistance. On the day of the discharge the patient's vitals were stable , and no other acute medical condition were reported by patient. the patient was felt safe to be discharge at home with family.  Procedures and Results:  none   Consultations:  General surgery  DISCHARGE MEDICATION: Discharge Medication List as of 02/19/2016  2:29 PM    START taking these medications   Details  polyethylene glycol (MIRALAX / GLYCOLAX) packet Take 17 g by mouth daily., Starting Sun 02/20/2016, Normal    psyllium (HYDROCIL/METAMUCIL) 95 % PACK Take 1 packet by mouth 3 (three) times daily., Starting Sat 02/19/2016, Normal      CONTINUE these medications which have NOT CHANGED   Details  acetaminophen (TYLENOL) 500 MG tablet Take 500 mg by mouth every 6 (six) hours as needed for pain., Until Discontinued, Historical Med    amLODipine (NORVASC) 10 MG tablet Take 1 tablet (10 mg total) by mouth daily., Starting Mon 07/12/2015, Normal    aspirin EC 81 MG tablet Take 81 mg by mouth daily., Historical Med    Blood Glucose Monitoring Suppl (ONE TOUCH ULTRA SYSTEM KIT) w/Device KIT 1 meter, Print    Cholecalciferol (VITAMIN D) 2000 UNITS CAPS Take 1 capsule by mouth daily., Historical Med  cloNIDine (CATAPRES) 0.2 MG tablet TAKE ONE TABLET BY MOUTH TWICE DAILY, Normal    furosemide (LASIX) 40 MG tablet TAKE ONE TABLET BY MOUTH ONCE DAILY, Normal    gabapentin (NEURONTIN) 100 MG capsule Take 1-2 capsules at bedtime for neuropathy, Normal    !! glucose blood test strip 1 each by Other route 4 (four) times daily -  before meals and at bedtime. Use as instructed,  Starting Mon 10/18/2015, Normal    !! glucose blood test strip Use as directed. Check blood sugar 3 times daily before meals and at bedtime., Print    insulin aspart (NOVOLOG) 100 UNIT/ML FlexPen Take 7 units with luch and supper.  Inject 15 minutes prior to meal or immediately after meal., Normal    Lancets (ONETOUCH ULTRASOFT) lancets Use as instructed, Print    LEVEMIR FLEXTOUCH 100 UNIT/ML Pen INJECT 22 UNITS SUBCUTANEOUSLY ONCE DAILY AT  10  PM, Normal    lisinopril (PRINIVIL,ZESTRIL) 2.5 MG tablet Take 2.5 mg by mouth daily. , Starting Fri 07/02/2015, Historical Med    omeprazole (PRILOSEC) 20 MG capsule TAKE ONE CAPSULE BY MOUTH ONCE DAILY, Normal    RELION SHORT PEN NEEDLES 31G X 8 MM MISC USE ONE PEN NEEDLE ONCE DAILY AS DIRECTED, Normal    simvastatin (ZOCOR) 20 MG tablet TAKE ONE TABLET BY MOUTH ONCE DAILY AT BEDTIME, Normal     !! - Potential duplicate medications found. Please discuss with provider.     Allergies  Allergen Reactions  . Ace Inhibitors Other (See Comments)    Hyperkalemia  . Actos [Pioglitazone] Swelling  . Aspirin Other (See Comments)    G.I. Upset.   . Metformin And Related Diarrhea   Discharge Instructions    Diet - low sodium heart healthy    Complete by:  As directed    Diet Carb Modified    Complete by:  As directed    Discharge instructions    Complete by:  As directed    It is important that you read following instructions as well as go over your medication list with RN to help you understand your care after this hospitalization.  Discharge Instructions: Please follow-up with PCP in one week  Please request your primary care physician to go over all Hospital Tests and Procedure/Radiological results at the follow up,  Please get all Hospital records sent to your PCP by signing hospital release before you go home.   Do not take more than prescribed Pain, Sleep and Anxiety Medications. You were cared for by a hospitalist during your hospital  stay. If you have any questions about your discharge medications or the care you received while you were in the hospital after you are discharged, you can call the unit and ask to speak with the hospitalist on call if the hospitalist that took care of you is not available.  Once you are discharged, your primary care physician will handle any further medical issues. Please note that NO REFILLS for any discharge medications will be authorized once you are discharged, as it is imperative that you return to your primary care physician (or establish a relationship with a primary care physician if you do not have one) for your aftercare needs so that they can reassess your need for medications and monitor your lab values. You Must read complete instructions/literature along with all the possible adverse reactions/side effects for all the Medicines you take and that have been prescribed to you. Take any new Medicines after you have completely  understood and accept all the possible adverse reactions/side effects. Wear Seat belts while driving. If you have smoked or chewed Tobacco in the last 2 yrs please stop smoking and/or stop any Recreational drug use.   Increase activity slowly    Complete by:  As directed      Discharge Exam: Filed Weights   02/16/16 2114 02/17/16 1710 02/19/16 0503  Weight: 74.8 kg (165 lb) 77.7 kg (171 lb 4.8 oz) 77.9 kg (171 lb 11.8 oz)   Vitals:   02/19/16 0501 02/19/16 0503  BP: (!) 137/49 (!) 153/59  Pulse: 76 76  Resp: 18   Temp: 98.4 F (36.9 C)    General: Appear in no distress, no Rash; Oral Mucosa moist. Cardiovascular: S1 and S2 Present, no Murmur, no JVD Respiratory: Bilateral Air entry present and Clear to Auscultation, no Crackles, no wheezes Abdomen: Bowel Sound present, Soft and no tenderness Extremities: no Pedal edema, no calf tenderness Neurology: Grossly no focal neuro deficit.  The results of significant diagnostics from this hospitalization  (including imaging, microbiology, ancillary and laboratory) are listed below for reference.    Significant Diagnostic Studies: Ct Abdomen Pelvis Wo Contrast  Result Date: 02/17/2016 CLINICAL DATA:  76 year old female with abdominal pain and vomiting. EXAM: CT ABDOMEN AND PELVIS WITHOUT CONTRAST TECHNIQUE: Multidetector CT imaging of the abdomen and pelvis was performed following the standard protocol without IV contrast. COMPARISON:  CT of the abdomen pelvis 02/08/2016. FINDINGS: Evaluation of this exam is limited in the absence of intravenous contrast. Lower chest: Minimal linear bibasilar atelectasis/scarring noted. The visualized lung bases are otherwise clear. No intra-abdominal free air or free fluid. Hepatobiliary: No focal liver abnormality is seen. No gallstones, gallbladder wall thickening, or biliary dilatation. Pancreas: Unremarkable. No pancreatic ductal dilatation or surrounding inflammatory changes. Spleen: Normal in size without focal abnormality. Adrenals/Urinary Tract: The adrenal glands appear unremarkable. Vascular calcification versus less likely punctate nonobstructing right renal interpolar stone. There is no hydronephrosis or obstructing stone on either side. Focal area of scarring noted along the posterior cortex of the superior pole of the right kidney. The visualized ureters and urinary bladder appear unremarkable. Stomach/Bowel: There is congenital malrotation of the bowel. There is a dilated loop of small bowel in the right lower abdomen with associated mild inflammatory changes. There is abutment of loops of small bowel in this region which may represent adhesion. The adjacent more distal segments of the bowel appears collapsed. A transition zone is seen in the right lower quadrant (series 2, image 52 and coronal image 53). There is mild engorgement of the mesentery associated with this bowel loops. Oral contrast from prior study mixed with stool noted throughout the colon. There is  colonic diverticulosis without active inflammatory changes. The cecum is located in the lower abdomen in the midline anteriorly. There is areas of submucosal fat deposit along the colonic wall likely sequela of chronic inflammation. Appendectomy. Vascular/Lymphatic: There is advanced aortoiliac atherosclerotic disease. Evaluation of the vasculature is limited on this noncontrast study. The IVC appears unremarkable. No portal venous gas identified. There is no adenopathy. Reproductive: The uterus and ovaries are grossly unremarkable. Other: Small fat containing umbilical and supraumbilical hernias noted. There is mild focal protrusion of the anterior wall of the transverse colon into the uppermost hernia. Musculoskeletal: Osteopenia with mild degenerative changes of the spine. Multilevel disc desiccation with vacuum phenomena. No acute fracture. IMPRESSION: Mildly dilated loops of small bowel in the right lower abdomen with a transition zone in the right lower quadrant  concerning for an early small-bowel obstruction possibly related to underlying adhesions. A segmental ileus or related to enteritis is less likely. Clinical correlation and follow-up recommended. Colonic diverticulosis. Congenital malrotation of the bowel. Electronically Signed   By: Anner Crete M.D.   On: 02/17/2016 04:58   Ct Abdomen Pelvis Wo Contrast  Result Date: 02/08/2016 CLINICAL DATA:  Left lower quadrant acute abdominal pain. Abdominal distension. Intermittent nausea and vomiting. Prior appendectomy. EXAM: CT ABDOMEN AND PELVIS WITHOUT CONTRAST TECHNIQUE: Multidetector CT imaging of the abdomen and pelvis was performed following the standard protocol without IV contrast. COMPARISON:  05/03/2014 CT abdomen/ pelvis. 05/05/2014 abdominal radiographs. 11/27/2014 renal sonogram. FINDINGS: Lower chest: No significant pulmonary nodules or acute consolidative airspace disease. Stable focal coarsely calcified right pericardial plaque.  Hepatobiliary: Normal liver with no liver mass. Normal gallbladder with no radiopaque cholelithiasis. No biliary ductal dilatation. Pancreas: Normal, with no mass or duct dilation. Spleen: Normal size. No mass. Adrenals/Urinary Tract: Normal adrenals. Punctate anterior interpolar right renal sinus calcification is favored to represent a vascular calcification. No definite renal stones. No hydronephrosis. No contour deforming renal mass. Stable mild scarring in the posterior upper right kidney. Normal caliber ureters, with no ureteral stones. Normal bladder. Stomach/Bowel: Grossly normal stomach. The duodenal jejunal junction appears to be located just to the right of the midline with the proximal small bowel located in the right abdomen. Normal caliber small bowel with no small bowel wall thickening. Appendectomy. Cecum is located in the anterior abdomen deep to the umbilicus. Mild sigmoid diverticulosis, with no large bowel wall thickening or pericolonic fat stranding. Oral contrast progresses to the transverse colon. Vascular/Lymphatic: Atherosclerotic nonaneurysmal abdominal aorta . No pathologically enlarged lymph nodes in the abdomen or pelvis. Reproductive: Grossly normal uterus.  No adnexal mass. Other: No pneumoperitoneum, ascites or focal fluid collection. There is a complex fat containing periumbilical ventral abdominal hernia measuring 8.0 x 2.3 x 5.1 cm in total (series 6/image 55), increased in size since 05/03/2014 Musculoskeletal: No aggressive appearing focal osseous lesions. Moderate thoracolumbar spondylosis . IMPRESSION: 1. Complex fat containing periumbilical ventral abdominal hernia, increased in size since 05/03/2014 . 2. No acute abnormality. No evidence of bowel obstruction or acute bowel inflammation. 3. Re- demonstration of congenital malrotation of the bowel. 4. Mild sigmoid diverticulosis. 5. Aortic atherosclerosis. Electronically Signed   By: Ilona Sorrel M.D.   On: 02/08/2016 12:15    Dg Chest Port 1 View  Result Date: 02/17/2016 CLINICAL DATA:  76 year old female with possible aspiration. EXAM: PORTABLE CHEST 1 VIEW COMPARISON:  Chest radiograph dated 09/27/2013 FINDINGS: Single portable view of the chest does not demonstrate a focal consolidation. There is no pleural effusion or pneumothorax. There is stable mild cardiomegaly. Median sternotomy wires noted. No acute osseous pathology. IMPRESSION: No acute cardiopulmonary process. Stable cardiomegaly. Electronically Signed   By: Anner Crete M.D.   On: 02/17/2016 04:14   Dg Abd Portable 1v-small Bowel Obstruction Protocol-initial, 8 Hr Delay  Result Date: 02/18/2016 CLINICAL DATA:  76 year old female with small bowel obstruction. EXAM: PORTABLE ABDOMEN - 1 VIEW COMPARISON:  Abdominal radiograph dated 02/17/2016 FINDINGS: An enteric tube is partially visualized with tip and side-port in the proximal stomach. The side port of the tube is approximately 2.5 cm distal to the GE junction. No bowel dilatation or evidence of obstruction. Oral contrast from prior CT of 02/17/2016 is now within the proximal colon. Residual old contrast from CT of 02/08/2016 is seen in the rectosigmoid. There is no free air. There is degenerative  changes of the spine. No acute osseous pathology. IMPRESSION: Enteric tube in the proximal stomach. No bowel dilatation. Interval passage of oral contrast from CT of 02/17/2016 into the colon. Electronically Signed   By: Anner Crete M.D.   On: 02/18/2016 00:29   Dg Abd Portable 1v  Result Date: 02/17/2016 CLINICAL DATA:  NG tube placement.  Constipation. EXAM: PORTABLE ABDOMEN - 1 VIEW COMPARISON:  CT 02/17/2016. FINDINGS: NG tube noted with tip below left hemidiaphragm. No bowel distention identified. No free air. Prior CABG. Cardiomegaly . Tiny right pleural effusion cannot be excluded . IMPRESSION: 1. NG tube noted with tip below left hemidiaphragm. No bowel distention identified. 2. Prior CABG.  Cardiomegaly. Small right pleural effusion cannot be excluded . Electronically Signed   By: Marcello Moores  Register   On: 02/17/2016 07:19    Microbiology: Recent Results (from the past 240 hour(s))  Urine culture     Status: None   Collection Time: 02/16/16  9:20 PM  Result Value Ref Range Status   Specimen Description URINE, RANDOM  Final   Special Requests NONE  Final   Culture NO GROWTH  Final   Report Status 02/18/2016 FINAL  Final     Labs: CBC:  Recent Labs Lab 02/16/16 2115 02/18/16 0649 02/19/16 0545  WBC 10.5 8.1 7.9  HGB 12.7 12.8 11.6*  HCT 38.7 40.6 36.8  MCV 85.4 88.1 87.8  PLT 201 183 403   Basic Metabolic Panel:  Recent Labs Lab 02/16/16 2115 02/18/16 0649 02/19/16 0545  NA 139 143 139  K 3.8 3.5 4.2  CL 103 105 103  CO2 '23 28 27  '$ GLUCOSE 105* 228* 178*  BUN 29* 18 15  CREATININE 1.79* 1.72* 1.66*  CALCIUM 9.4 9.1 8.8*   Liver Function Tests:  Recent Labs Lab 02/16/16 2115  AST 22  ALT 17  ALKPHOS 89  BILITOT 0.6  PROT 7.6  ALBUMIN 4.1    Recent Labs Lab 02/16/16 2115  LIPASE 27   No results for input(s): AMMONIA in the last 168 hours. Cardiac Enzymes: No results for input(s): CKTOTAL, CKMB, CKMBINDEX, TROPONINI in the last 168 hours. BNP (last 3 results) No results for input(s): BNP in the last 8760 hours. CBG:  Recent Labs Lab 02/18/16 1157 02/18/16 1704 02/18/16 2118 02/19/16 0826 02/19/16 1136  GLUCAP 246* 222* 135* 161* 363*   Time spent: 30 minutes  Signed:  Kirill Chatterjee  [facility] 02/19/2016 , 7:34 PM

## 2016-02-21 ENCOUNTER — Telehealth: Payer: Self-pay | Admitting: Physician Assistant

## 2016-02-21 NOTE — Telephone Encounter (Signed)
Pt wants her insurance contacted so that she can get depends for free.

## 2016-02-21 NOTE — Telephone Encounter (Signed)
Spoke with pt she will need to contact her ins co and see if they will cover the depends,Pt understands and will contact ins co.

## 2016-03-03 DIAGNOSIS — R69 Illness, unspecified: Secondary | ICD-10-CM | POA: Diagnosis not present

## 2016-03-10 DIAGNOSIS — E669 Obesity, unspecified: Secondary | ICD-10-CM | POA: Diagnosis not present

## 2016-03-10 DIAGNOSIS — G9009 Other idiopathic peripheral autonomic neuropathy: Secondary | ICD-10-CM | POA: Diagnosis not present

## 2016-03-10 DIAGNOSIS — K219 Gastro-esophageal reflux disease without esophagitis: Secondary | ICD-10-CM | POA: Diagnosis not present

## 2016-03-10 DIAGNOSIS — Z87891 Personal history of nicotine dependence: Secondary | ICD-10-CM | POA: Diagnosis not present

## 2016-03-10 DIAGNOSIS — Z0000 Encounter for general adult medical examination without abnormal findings: Secondary | ICD-10-CM | POA: Diagnosis not present

## 2016-03-10 DIAGNOSIS — E7800 Pure hypercholesterolemia, unspecified: Secondary | ICD-10-CM | POA: Diagnosis not present

## 2016-03-10 DIAGNOSIS — E1142 Type 2 diabetes mellitus with diabetic polyneuropathy: Secondary | ICD-10-CM | POA: Diagnosis not present

## 2016-03-10 DIAGNOSIS — I10 Essential (primary) hypertension: Secondary | ICD-10-CM | POA: Diagnosis not present

## 2016-03-10 DIAGNOSIS — K5900 Constipation, unspecified: Secondary | ICD-10-CM | POA: Diagnosis not present

## 2016-03-10 DIAGNOSIS — Z6830 Body mass index (BMI) 30.0-30.9, adult: Secondary | ICD-10-CM | POA: Diagnosis not present

## 2016-03-10 DIAGNOSIS — K08409 Partial loss of teeth, unspecified cause, unspecified class: Secondary | ICD-10-CM | POA: Diagnosis not present

## 2016-03-17 DIAGNOSIS — E559 Vitamin D deficiency, unspecified: Secondary | ICD-10-CM | POA: Diagnosis not present

## 2016-03-17 DIAGNOSIS — D509 Iron deficiency anemia, unspecified: Secondary | ICD-10-CM | POA: Diagnosis not present

## 2016-03-17 DIAGNOSIS — R809 Proteinuria, unspecified: Secondary | ICD-10-CM | POA: Diagnosis not present

## 2016-03-17 DIAGNOSIS — D519 Vitamin B12 deficiency anemia, unspecified: Secondary | ICD-10-CM | POA: Diagnosis not present

## 2016-03-17 DIAGNOSIS — Z79899 Other long term (current) drug therapy: Secondary | ICD-10-CM | POA: Diagnosis not present

## 2016-03-17 DIAGNOSIS — N183 Chronic kidney disease, stage 3 (moderate): Secondary | ICD-10-CM | POA: Diagnosis not present

## 2016-03-17 DIAGNOSIS — I10 Essential (primary) hypertension: Secondary | ICD-10-CM | POA: Diagnosis not present

## 2016-03-22 DIAGNOSIS — I509 Heart failure, unspecified: Secondary | ICD-10-CM | POA: Diagnosis not present

## 2016-03-22 DIAGNOSIS — N250 Renal osteodystrophy: Secondary | ICD-10-CM | POA: Diagnosis not present

## 2016-03-22 DIAGNOSIS — N184 Chronic kidney disease, stage 4 (severe): Secondary | ICD-10-CM | POA: Diagnosis not present

## 2016-03-22 DIAGNOSIS — R809 Proteinuria, unspecified: Secondary | ICD-10-CM | POA: Diagnosis not present

## 2016-03-22 DIAGNOSIS — R69 Illness, unspecified: Secondary | ICD-10-CM | POA: Diagnosis not present

## 2016-03-28 ENCOUNTER — Telehealth: Payer: Self-pay

## 2016-03-28 NOTE — Telephone Encounter (Signed)
Called patient to schedule an  Medicare physical per [facility]

## 2016-04-05 ENCOUNTER — Other Ambulatory Visit (HOSPITAL_COMMUNITY): Payer: Medicare HMO

## 2016-04-05 ENCOUNTER — Encounter (HOSPITAL_COMMUNITY): Payer: Self-pay | Admitting: Emergency Medicine

## 2016-04-05 ENCOUNTER — Inpatient Hospital Stay (HOSPITAL_COMMUNITY)
Admission: EM | Admit: 2016-04-05 | Discharge: 2016-04-08 | DRG: 291 | Disposition: A | Discharge status: Home / Self Care | Payer: Medicare HMO | Attending: Internal Medicine | Admitting: Internal Medicine

## 2016-04-05 ENCOUNTER — Emergency Department (HOSPITAL_COMMUNITY): Payer: Medicare HMO

## 2016-04-05 DIAGNOSIS — I509 Heart failure, unspecified: Secondary | ICD-10-CM | POA: Diagnosis not present

## 2016-04-05 DIAGNOSIS — Z808 Family history of malignant neoplasm of other organs or systems: Secondary | ICD-10-CM

## 2016-04-05 DIAGNOSIS — I130 Hypertensive heart and chronic kidney disease with heart failure and stage 1 through stage 4 chronic kidney disease, or unspecified chronic kidney disease: Secondary | ICD-10-CM | POA: Diagnosis not present

## 2016-04-05 DIAGNOSIS — Z833 Family history of diabetes mellitus: Secondary | ICD-10-CM | POA: Diagnosis not present

## 2016-04-05 DIAGNOSIS — K219 Gastro-esophageal reflux disease without esophagitis: Secondary | ICD-10-CM | POA: Diagnosis present

## 2016-04-05 DIAGNOSIS — J9601 Acute respiratory failure with hypoxia: Secondary | ICD-10-CM | POA: Diagnosis present

## 2016-04-05 DIAGNOSIS — R1084 Generalized abdominal pain: Secondary | ICD-10-CM | POA: Diagnosis not present

## 2016-04-05 DIAGNOSIS — I272 Pulmonary hypertension, unspecified: Secondary | ICD-10-CM | POA: Diagnosis present

## 2016-04-05 DIAGNOSIS — K2970 Gastritis, unspecified, without bleeding: Secondary | ICD-10-CM | POA: Diagnosis not present

## 2016-04-05 DIAGNOSIS — I2510 Atherosclerotic heart disease of native coronary artery without angina pectoris: Secondary | ICD-10-CM | POA: Diagnosis present

## 2016-04-05 DIAGNOSIS — J449 Chronic obstructive pulmonary disease, unspecified: Secondary | ICD-10-CM | POA: Diagnosis present

## 2016-04-05 DIAGNOSIS — Z79899 Other long term (current) drug therapy: Secondary | ICD-10-CM

## 2016-04-05 DIAGNOSIS — I5021 Acute systolic (congestive) heart failure: Secondary | ICD-10-CM

## 2016-04-05 DIAGNOSIS — Z9889 Other specified postprocedural states: Secondary | ICD-10-CM | POA: Diagnosis not present

## 2016-04-05 DIAGNOSIS — D72829 Elevated white blood cell count, unspecified: Secondary | ICD-10-CM | POA: Diagnosis present

## 2016-04-05 DIAGNOSIS — Z8774 Personal history of (corrected) congenital malformations of heart and circulatory system: Secondary | ICD-10-CM

## 2016-04-05 DIAGNOSIS — Z794 Long term (current) use of insulin: Secondary | ICD-10-CM | POA: Diagnosis not present

## 2016-04-05 DIAGNOSIS — N183 Chronic kidney disease, stage 3 (moderate): Secondary | ICD-10-CM | POA: Diagnosis not present

## 2016-04-05 DIAGNOSIS — M810 Age-related osteoporosis without current pathological fracture: Secondary | ICD-10-CM | POA: Diagnosis present

## 2016-04-05 DIAGNOSIS — E1121 Type 2 diabetes mellitus with diabetic nephropathy: Secondary | ICD-10-CM | POA: Diagnosis not present

## 2016-04-05 DIAGNOSIS — T502X5A Adverse effect of carbonic-anhydrase inhibitors, benzothiadiazides and other diuretics, initial encounter: Secondary | ICD-10-CM | POA: Diagnosis not present

## 2016-04-05 DIAGNOSIS — N179 Acute kidney failure, unspecified: Secondary | ICD-10-CM | POA: Diagnosis present

## 2016-04-05 DIAGNOSIS — I5033 Acute on chronic diastolic (congestive) heart failure: Secondary | ICD-10-CM | POA: Diagnosis present

## 2016-04-05 DIAGNOSIS — K449 Diaphragmatic hernia without obstruction or gangrene: Secondary | ICD-10-CM | POA: Diagnosis present

## 2016-04-05 DIAGNOSIS — R109 Unspecified abdominal pain: Secondary | ICD-10-CM

## 2016-04-05 DIAGNOSIS — R0602 Shortness of breath: Secondary | ICD-10-CM | POA: Diagnosis not present

## 2016-04-05 DIAGNOSIS — Z7982 Long term (current) use of aspirin: Secondary | ICD-10-CM | POA: Diagnosis not present

## 2016-04-05 DIAGNOSIS — I2721 Secondary pulmonary arterial hypertension: Secondary | ICD-10-CM

## 2016-04-05 DIAGNOSIS — E1122 Type 2 diabetes mellitus with diabetic chronic kidney disease: Secondary | ICD-10-CM | POA: Diagnosis not present

## 2016-04-05 DIAGNOSIS — Z87891 Personal history of nicotine dependence: Secondary | ICD-10-CM | POA: Diagnosis not present

## 2016-04-05 DIAGNOSIS — E785 Hyperlipidemia, unspecified: Secondary | ICD-10-CM | POA: Diagnosis present

## 2016-04-05 DIAGNOSIS — I10 Essential (primary) hypertension: Secondary | ICD-10-CM | POA: Diagnosis not present

## 2016-04-05 DIAGNOSIS — R140 Abdominal distension (gaseous): Secondary | ICD-10-CM | POA: Diagnosis not present

## 2016-04-05 LAB — URINALYSIS, ROUTINE W REFLEX MICROSCOPIC
BILIRUBIN URINE: NEGATIVE
Glucose, UA: NEGATIVE mg/dL
KETONES UR: NEGATIVE mg/dL
NITRITE: NEGATIVE
PH: 5 (ref 5.0–8.0)
PROTEIN: 30 mg/dL — AB
Specific Gravity, Urine: 1.009 (ref 1.005–1.030)

## 2016-04-05 LAB — CBC WITH DIFFERENTIAL/PLATELET
BASOS ABS: 0.1 10*3/uL (ref 0.0–0.1)
BASOS PCT: 1 %
EOS ABS: 0.1 10*3/uL (ref 0.0–0.7)
Eosinophils Relative: 1 %
HCT: 39.6 % (ref 36.0–46.0)
HEMOGLOBIN: 12.8 g/dL (ref 12.0–15.0)
Lymphocytes Relative: 9 %
Lymphs Abs: 1 10*3/uL (ref 0.7–4.0)
MCH: 27.6 pg (ref 26.0–34.0)
MCHC: 32.3 g/dL (ref 30.0–36.0)
MCV: 85.3 fL (ref 78.0–100.0)
MONO ABS: 0.9 10*3/uL (ref 0.1–1.0)
MONOS PCT: 8 %
NEUTROS ABS: 8.8 10*3/uL — AB (ref 1.7–7.7)
NEUTROS PCT: 81 %
Platelets: 205 10*3/uL (ref 150–400)
RBC: 4.64 MIL/uL (ref 3.87–5.11)
RDW: 15 % (ref 11.5–15.5)
WBC: 10.8 10*3/uL — ABNORMAL HIGH (ref 4.0–10.5)

## 2016-04-05 LAB — COMPREHENSIVE METABOLIC PANEL
ALBUMIN: 4 g/dL (ref 3.5–5.0)
ALT: 14 U/L (ref 14–54)
ANION GAP: 11 (ref 5–15)
AST: 23 U/L (ref 15–41)
Alkaline Phosphatase: 97 U/L (ref 38–126)
BUN: 18 mg/dL (ref 6–20)
CHLORIDE: 102 mmol/L (ref 101–111)
CO2: 26 mmol/L (ref 22–32)
Calcium: 9.4 mg/dL (ref 8.9–10.3)
Creatinine, Ser: 1.63 mg/dL — ABNORMAL HIGH (ref 0.44–1.00)
GFR calc non Af Amer: 30 mL/min — ABNORMAL LOW (ref >60)
GFR, EST AFRICAN AMERICAN: 34 mL/min — AB (ref >60)
GLUCOSE: 202 mg/dL — AB (ref 65–99)
POTASSIUM: 3.8 mmol/L (ref 3.5–5.1)
SODIUM: 139 mmol/L (ref 135–145)
Total Bilirubin: 0.9 mg/dL (ref 0.3–1.2)
Total Protein: 7.9 g/dL (ref 6.5–8.1)

## 2016-04-05 LAB — TROPONIN I
Troponin I: <0.03 ng/mL (ref <0.03)
Troponin I: 0.03 ng/mL (ref <0.03)

## 2016-04-05 LAB — BRAIN NATRIURETIC PEPTIDE: B NATRIURETIC PEPTIDE 5: 423.1 pg/mL — AB (ref 0.0–100.0)

## 2016-04-05 LAB — GLUCOSE, CAPILLARY: Glucose-Capillary: 225 mg/dL — ABNORMAL HIGH (ref 65–99)

## 2016-04-05 LAB — LIPASE, BLOOD: LIPASE: 11 U/L (ref 11–51)

## 2016-04-05 LAB — D-DIMER, QUANTITATIVE (NOT AT ARMC): D DIMER QUANT: 0.34 ug{FEU}/mL (ref 0.00–0.50)

## 2016-04-05 MED ORDER — ONDANSETRON HCL 4 MG/2ML IJ SOLN
4.0000 mg | Freq: Once | INTRAMUSCULAR | Status: AC
  Administered 2016-04-05: 4 mg via INTRAVENOUS
  Filled 2016-04-05: qty 2

## 2016-04-05 MED ORDER — LISINOPRIL 2.5 MG PO TABS: 2.5000 mg | ORAL_TABLET | Freq: Every day | ORAL | Status: DC

## 2016-04-05 MED ORDER — ASPIRIN EC 81 MG PO TBEC
81.0000 mg | DELAYED_RELEASE_TABLET | Freq: Every day | ORAL | Status: DC
  Administered 2016-04-06 – 2016-04-08 (×3): 81 mg via ORAL
  Filled 2016-04-05 (×3): qty 1

## 2016-04-05 MED ORDER — IOPAMIDOL (ISOVUE-300) INJECTION 61%
INTRAVENOUS | Status: AC
  Administered 2016-04-05: 30 mL
  Filled 2016-04-05: qty 75

## 2016-04-05 MED ORDER — INSULIN ASPART 100 UNIT/ML ~~LOC~~ SOLN
0.0000 [IU] | Freq: Three times a day (TID) | SUBCUTANEOUS | Status: DC
  Administered 2016-04-06: 5 [IU] via SUBCUTANEOUS
  Administered 2016-04-06 – 2016-04-07 (×2): 3 [IU] via SUBCUTANEOUS
  Administered 2016-04-07: 2 [IU] via SUBCUTANEOUS
  Administered 2016-04-07: 8 [IU] via SUBCUTANEOUS
  Administered 2016-04-08: 2 [IU] via SUBCUTANEOUS
  Administered 2016-04-08: 3 [IU] via SUBCUTANEOUS

## 2016-04-05 MED ORDER — SIMVASTATIN 20 MG PO TABS
20.0000 mg | ORAL_TABLET | Freq: Every day | ORAL | Status: DC
  Administered 2016-04-06 – 2016-04-08 (×3): 20 mg via ORAL
  Filled 2016-04-05 (×3): qty 1

## 2016-04-05 MED ORDER — ACETAMINOPHEN 325 MG PO TABS: 650.0000 mg | ORAL_TABLET | ORAL | Status: DC | PRN

## 2016-04-05 MED ORDER — HEPARIN SODIUM (PORCINE) 5000 UNIT/ML IJ SOLN
5000.0000 [IU] | Freq: Three times a day (TID) | INTRAMUSCULAR | Status: DC
  Administered 2016-04-05 – 2016-04-08 (×7): 5000 [IU] via SUBCUTANEOUS
  Filled 2016-04-05 (×7): qty 1

## 2016-04-05 MED ORDER — GABAPENTIN 100 MG PO CAPS: 100.0000 mg | ORAL_CAPSULE | Freq: Every evening | ORAL | Status: DC | PRN

## 2016-04-05 MED ORDER — INSULIN DETEMIR 100 UNIT/ML ~~LOC~~ SOLN
22.0000 [IU] | Freq: Every day | SUBCUTANEOUS | Status: DC
  Administered 2016-04-05: 22 [IU] via SUBCUTANEOUS
  Filled 2016-04-05: qty 0.22

## 2016-04-05 MED ORDER — PANTOPRAZOLE SODIUM 40 MG PO TBEC
40.0000 mg | DELAYED_RELEASE_TABLET | Freq: Every day | ORAL | Status: DC
  Administered 2016-04-06 – 2016-04-08 (×3): 40 mg via ORAL
  Filled 2016-04-05 (×3): qty 1

## 2016-04-05 MED ORDER — POLYETHYLENE GLYCOL 3350 17 G PO PACK
17.0000 g | PACK | Freq: Every day | ORAL | Status: DC
  Administered 2016-04-05 – 2016-04-08 (×4): 17 g via ORAL
  Filled 2016-04-05 (×4): qty 1

## 2016-04-05 MED ORDER — IOPAMIDOL (ISOVUE-300) INJECTION 61%
INTRAVENOUS | Status: AC
  Administered 2016-04-05: 30 mL
  Filled 2016-04-05: qty 30

## 2016-04-05 MED ORDER — SODIUM CHLORIDE 0.9 % IV BOLUS (SEPSIS)
500.0000 mL | Freq: Once | INTRAVENOUS | Status: AC
  Administered 2016-04-05: 500 mL via INTRAVENOUS

## 2016-04-05 MED ORDER — FUROSEMIDE 10 MG/ML IJ SOLN
40.0000 mg | Freq: Once | INTRAMUSCULAR | Status: AC
  Administered 2016-04-05: 40 mg via INTRAVENOUS
  Filled 2016-04-05: qty 4

## 2016-04-05 MED ORDER — FUROSEMIDE 10 MG/ML IJ SOLN
40.0000 mg | Freq: Two times a day (BID) | INTRAMUSCULAR | Status: DC
  Administered 2016-04-06 (×2): 40 mg via INTRAVENOUS
  Filled 2016-04-05 (×5): qty 4

## 2016-04-05 MED ORDER — INSULIN ASPART 100 UNIT/ML ~~LOC~~ SOLN
0.0000 [IU] | Freq: Every day | SUBCUTANEOUS | Status: DC
  Administered 2016-04-05 – 2016-04-07 (×2): 2 [IU] via SUBCUTANEOUS

## 2016-04-05 MED ORDER — SODIUM CHLORIDE 0.9 % IV SOLN: 250.0000 mL | INTRAVENOUS | Status: DC | PRN

## 2016-04-05 MED ORDER — AMLODIPINE BESYLATE 5 MG PO TABS
10.0000 mg | ORAL_TABLET | Freq: Every day | ORAL | Status: DC
  Administered 2016-04-06 – 2016-04-08 (×3): 10 mg via ORAL
  Filled 2016-04-05 (×3): qty 2

## 2016-04-05 MED ORDER — SODIUM CHLORIDE 0.9% FLUSH: 3.0000 mL | INTRAVENOUS | Status: DC | PRN

## 2016-04-05 MED ORDER — SODIUM CHLORIDE 0.9% FLUSH
3.0000 mL | Freq: Two times a day (BID) | INTRAVENOUS | Status: DC
  Administered 2016-04-05 – 2016-04-08 (×6): 3 mL via INTRAVENOUS

## 2016-04-05 MED ORDER — IPRATROPIUM-ALBUTEROL 0.5-2.5 (3) MG/3ML IN SOLN: 3.0000 mL | Freq: Once | RESPIRATORY_TRACT | Status: DC

## 2016-04-05 MED ORDER — CLONIDINE HCL 0.2 MG PO TABS
0.2000 mg | ORAL_TABLET | Freq: Two times a day (BID) | ORAL | Status: DC
  Administered 2016-04-05 – 2016-04-08 (×6): 0.2 mg via ORAL
  Filled 2016-04-05 (×6): qty 1

## 2016-04-05 MED ORDER — IPRATROPIUM-ALBUTEROL 0.5-2.5 (3) MG/3ML IN SOLN: 3.0000 mL | RESPIRATORY_TRACT | Status: DC | PRN

## 2016-04-05 MED ORDER — ONDANSETRON HCL 4 MG/2ML IJ SOLN: 4.0000 mg | Freq: Four times a day (QID) | INTRAMUSCULAR | Status: DC | PRN

## 2016-04-05 NOTE — ED Notes (Signed)
Pt called out complaining of nausea

## 2016-04-05 NOTE — Progress Notes (Signed)
   04/05/16 2128  Vitals  Temp 98.9 F (37.2 C)  Temp Source Oral  BP (!) 186/81  BP Location Right Arm  BP Method Automatic  Patient Position (if appropriate) Sitting  Pulse Rate 100  Pulse Rate Source Dinamap  Oxygen Therapy  SpO2 97 %  Height and Weight  Height 5\' 3"  (1.6 m)  Weight 77.5 kg (170 lb 13.7 oz)  Type of Scale Used Standing  Type of Weight Actual  BSA (Calculated - sq m) 1.86 sq meters  BMI (Calculated) 30.3  Weight in (lb) to have BMI = 25 140.8  Admitted pt to rm 3E17 from ED, pt alert and oriented, denied pain at this time, oriented to room, call ball placed within reach, placed on cardiac monitor, CCMD made aware. Educated on patient's safety plan, pt verbalized understanding. Will continue to monitor.

## 2016-04-05 NOTE — ED Notes (Signed)
Pt O2 sats went to 86% when trialed off O2.

## 2016-04-05 NOTE — H&P (Signed)
History and Physical    Laura Best FIE:[id number] DOB: Oct 16, 1940 DOA: 04/05/2016  Referring MD/NP/PA: Dr. Roderic Palau PCP: [name], PA-C  Patient coming from: Home via EMS  Chief Complaint: Shortness of breath and abdominal pain  HPI: Laura Best is a 76 y.o. female with medical history significant of HTN, HLD, diastolic CHF, CAD, and SBO; who presents with complaints of shortness of breath and abdominal pain. She complains of pain in the right upper quadrant of her abdomen that is dull and will sometimes moved her back. Patient recently had a small bowel obstruction in 01/2016, but was resolved with bowel rest and did not require any surgical intervention. She notes daily bowel movements. She states that she has been short of breath for a prolonged period in time. Notes easily getting winded doing regular tasks at home which she has to rest to catch her breath. She does not normally require home oxygen. Associated symptoms include abdominal distention, nausea, decreased appetite, intermittent dry nonproductive cough, dyspnea on exertion. Patient is unsure of a any significant weight gain or leg swelling. Denies any chest pain, loss of consciousness, dysuria, vomiting, or constipation. Patient notes that she was recently stopped a few months ago on lisinopril due to elevated potassium levels.   ED Course: Upon admission into the emergency department patient was seen to be febrile to 100.67F, pulse 92-99, respirations 16-28, blood pressure elevated up to 191/79, and O2 saturations as low as 87% on room air. Patient was placed on 2 L nasal cannula with improvement of O2 saturations greater than 92%. Lab work revealed CBC 10.8, creatinine 1.63, glucose 202, lipase 11, BNP 243.1, d-dimer negative, troponin negative, and all other labs appear to be relatively within normal limits. Chest x-ray showed pulmonary vascular congestion and cardiomegaly. CT scan of the abdomen revealed some small abdominal  hernias and no signs of SBO. Patient was given 40 mg of Lasix IV in the ED and TRH called to admit.  Review of Systems: As per HPI otherwise 10 point review of systems negative.   Past Medical History:  Diagnosis Date  . Arthritis    "hands, feet" (02/17/2016)  . CHF (congestive heart failure) ([place])   . CKD (chronic kidney disease) stage 3, GFR 30-59 ml/min 12/25/2012  . COPD (chronic obstructive pulmonary disease) ([place])   . Diastolic heart failure ([place])   . Hyperlipidemia   . Hypertension   . Osteoporosis   . Osteoporosis   . Pneumonia    "several times" (02/17/2016)  . Small bowel obstruction 02/17/2016  . Tricuspid regurgitation    Echo 05/02/06-Nml LV. Mild MR. Mod TR  . Type II diabetes mellitus ([place])   . Vitamin D deficiency     Past Surgical History:  Procedure Laterality Date  . HEART CHAMBER REVISION     patched hole --[place]  . LAPAROSCOPIC APPENDECTOMY N/A 09/27/2013   Procedure: APPENDECTOMY - CONVERTED FROM LAPAROSCOPIC;  Surgeon: Donnie Mesa, MD;  Location: [facility];  Service: General;  Laterality: N/A;  . TUBAL LIGATION       reports that she quit smoking about 7 years ago. Her smoking use included Cigarettes. She has a 25.00 pack-year smoking history. She has never used smokeless tobacco. She reports that she does not drink alcohol or use drugs.  Allergies  Allergen Reactions  . Ace Inhibitors Other (See Comments)    Hyperkalemia  . Actos [Pioglitazone] Swelling  . Aspirin Other (See Comments)    G.I. Upset.   Marland Kitchen  Metformin And Related Diarrhea    Family History  Problem Relation Age of Onset  . Cancer Father   . Diabetes Father   . Cancer Brother     Brain  . Cancer Sister     abdominal fat    Prior to Admission medications   Medication Sig Start Date End Date Taking? Authorizing Provider  acetaminophen (TYLENOL) 500 MG tablet Take 500 mg by mouth every 6 (six) hours as needed for pain.    Historical Provider, MD  amLODipine (NORVASC) 10 MG  tablet Take 1 tablet (10 mg total) by mouth daily. 07/12/15   Lendon Colonel, NP  aspirin EC 81 MG tablet Take 81 mg by mouth daily.    Historical Provider, MD  Blood Glucose Monitoring Suppl (ONE TOUCH ULTRA SYSTEM KIT) w/Device KIT 1 meter 02/11/16   Orlena Sheldon, PA-C  Cholecalciferol (VITAMIN D) 2000 UNITS CAPS Take 1 capsule by mouth daily.    Historical Provider, MD  cloNIDine (CATAPRES) 0.2 MG tablet TAKE ONE TABLET BY MOUTH TWICE DAILY 11/10/15   Lendon Colonel, NP  furosemide (LASIX) 40 MG tablet TAKE ONE TABLET BY MOUTH ONCE DAILY 05/12/15   Orlena Sheldon, PA-C  gabapentin (NEURONTIN) 100 MG capsule Take 1-2 capsules at bedtime for neuropathy Patient taking differently: Take 100-200 mg by mouth at bedtime.  08/27/15   Alycia Rossetti, MD  glucose blood test strip 1 each by Other route 4 (four) times daily -  before meals and at bedtime. Use as instructed 10/18/15   Orlena Sheldon, PA-C  glucose blood test strip Use as directed. Check blood sugar 3 times daily before meals and at bedtime. 02/11/16   Lonie Peak Dixon, PA-C  insulin aspart (NOVOLOG) 100 UNIT/ML FlexPen Take 7 units with luch and supper.  Inject 15 minutes prior to meal or immediately after meal. 01/21/16   Orlena Sheldon, PA-C  Lancets [facility] ULTRASOFT) lancets Use as instructed 02/11/16   Orlena Sheldon, PA-C  LEVEMIR FLEXTOUCH 100 UNIT/ML Pen INJECT 22 UNITS SUBCUTANEOUSLY ONCE DAILY AT  10  PM 11/10/15   Orlena Sheldon, PA-C  lisinopril (PRINIVIL,ZESTRIL) 2.5 MG tablet Take 2.5 mg by mouth daily.  07/02/15   Historical Provider, MD  omeprazole (PRILOSEC) 20 MG capsule TAKE ONE CAPSULE BY MOUTH ONCE DAILY 08/16/15   Orlena Sheldon, PA-C  polyethylene glycol [facility] / GLYCOLAX) packet Take 17 g by mouth daily. 02/20/16   Lavina Hamman, MD  psyllium (HYDROCIL/METAMUCIL) 95 % PACK Take 1 packet by mouth 3 (three) times daily. 02/19/16   Lavina Hamman, MD  RELION SHORT PEN NEEDLES 31G X 8 MM MISC USE ONE PEN NEEDLE ONCE DAILY AS DIRECTED  03/10/14   Orlena Sheldon, PA-C  simvastatin (ZOCOR) 20 MG tablet TAKE ONE TABLET BY MOUTH ONCE DAILY AT BEDTIME 05/12/15   Orlena Sheldon, PA-C    Physical Exam:   Constitutional:Obese elderly female who appears to be in mild respiratory distress.  Vitals:   04/05/16 1630 04/05/16 1715 04/05/16 1823 04/05/16 1906  BP: 157/75 191/79  171/65  Pulse: 98 97  96  Resp: '16 21  19  '$ Temp:   100.2 F (37.9 C)   TempSrc:   Oral   SpO2: 96% 95%  94%   Eyes: PERRL, lids and conjunctivae normal ENMT: Mucous membranes are moist. Posterior pharynx clear of any exudate or lesions.  Neck: normal, supple, no masses, no thyromegaly. Unable to adequately assess for JVD.  Respiratory: Mildly tachypneic with bilateral crackles appreciated throughout both lung fields. Mild expiratory wheeze also appreciated.  Cardiovascular: Regular rate and rhythm, no murmurs / rubs / gallops. No extremity edema. 2+ pedal pulses. No carotid bruits.  Abdomen:Mild right upper quadrant tenderness to palpation. Ventral hernia appreciated near previous healed surgical scars. No hepatosplenomegaly. Bowel sounds positive.  Musculoskeletal: no clubbing / cyanosis. No joint deformity upper and lower extremities. Good ROM, no contractures. Normal muscle tone.  Skin: no rashes, lesions, ulcers. No induration Neurologic: CN 2-12 grossly intact. Sensation intact, DTR normal. Strength 5/5 in all 4.  Psychiatric: Normal judgment and insight. Alert and oriented x 3. Normal mood.     Labs on Admission: I have personally reviewed following labs and imaging studies  CBC:  Recent Labs Lab 04/05/16 1406  WBC 10.8*  NEUTROABS 8.8*  HGB 12.8  HCT 39.6  MCV 85.3  PLT 700   Basic Metabolic Panel:  Recent Labs Lab 04/05/16 1406  NA 139  K 3.8  CL 102  CO2 26  GLUCOSE 202*  BUN 18  CREATININE 1.63*  CALCIUM 9.4   GFR: CrCl cannot be calculated (Unknown ideal weight.). Liver Function Tests:  Recent Labs Lab 04/05/16 1406    AST 23  ALT 14  ALKPHOS 97  BILITOT 0.9  PROT 7.9  ALBUMIN 4.0    Recent Labs Lab 04/05/16 1406  LIPASE 11   No results for input(s): AMMONIA in the last 168 hours. Coagulation Profile: No results for input(s): INR, PROTIME in the last 168 hours. Cardiac Enzymes:  Recent Labs Lab 04/05/16 1406  TROPONINI <0.03   BNP (last 3 results) No results for input(s): PROBNP in the last 8760 hours. HbA1C: No results for input(s): HGBA1C in the last 72 hours. CBG: No results for input(s): GLUCAP in the last 168 hours. Lipid Profile: No results for input(s): CHOL, HDL, LDLCALC, TRIG, CHOLHDL, LDLDIRECT in the last 72 hours. Thyroid Function Tests: No results for input(s): TSH, T4TOTAL, FREET4, T3FREE, THYROIDAB in the last 72 hours. Anemia Panel: No results for input(s): VITAMINB12, FOLATE, FERRITIN, TIBC, IRON, RETICCTPCT in the last 72 hours. Urine analysis:    Component Value Date/Time   COLORURINE STRAW (A) 04/05/2016 1500   APPEARANCEUR CLEAR 04/05/2016 1500   LABSPEC 1.009 04/05/2016 1500   PHURINE 5.0 04/05/2016 1500   GLUCOSEU NEGATIVE 04/05/2016 1500   HGBUR SMALL (A) 04/05/2016 1500   BILIRUBINUR NEGATIVE 04/05/2016 1500   KETONESUR NEGATIVE 04/05/2016 1500   PROTEINUR 30 (A) 04/05/2016 1500   UROBILINOGEN 0.2 05/03/2014 0645   NITRITE NEGATIVE 04/05/2016 1500   LEUKOCYTESUR TRACE (A) 04/05/2016 1500   Sepsis Labs: No results found for this or any previous visit (from the past 240 hour(s)).   Radiological Exams on Admission: Ct Abdomen Pelvis Wo Contrast  Result Date: 04/05/2016 CLINICAL DATA:  Abdominal pain, distention. History of small bowel obstruction. Rule out obstruction. EXAM: CT ABDOMEN AND PELVIS WITHOUT CONTRAST TECHNIQUE: Multidetector CT imaging of the abdomen and pelvis was performed following the standard protocol without IV contrast. COMPARISON:  02/17/2016 FINDINGS: Lower chest: Heart is mildly enlarged. Scarring in the bases. No effusions.  Hepatobiliary: No focal hepatic abnormality. Gallbladder unremarkable. Pancreas: No focal abnormality or ductal dilatation. Spleen: No focal abnormality.  Normal size. Adrenals/Urinary Tract: Punctate nonobstructing stone in the midpole of the right kidney. No hydronephrosis. Adrenal glands and urinary bladder are unremarkable. Stomach/Bowel: Moderate stool throughout the colon. Sigmoid diverticulosis. No active diverticulitis. No small bowel distention to suggest small bowel obstruction.  Stomach decompressed, unremarkable. Vascular/Lymphatic: Heavily calcified aorta and iliac vessels. No aneurysm. No adenopathy. Reproductive: Uterus and adnexa unremarkable.  No mass. Other: No free fluid or free air. Small umbilical and supraumbilical ventral hernias containing fat. Musculoskeletal: No acute bony abnormality. IMPRESSION: No evidence of bowel obstruction. Small umbilical and ventral hernias containing fat, unchanged. Heavily calcified aorta and iliac vessels. No acute findings in the abdomen or pelvis. Electronically Signed   By: Rolm Baptise M.D.   On: 04/05/2016 17:43   Dg Chest 2 View  Result Date: 04/05/2016 CLINICAL DATA:  Shortness of breath. EXAM: CHEST  2 VIEW COMPARISON:  02/17/2016 . FINDINGS: Median sternotomy. Cardiomegaly with pulmonary vascular prominence. No focal alveolar infiltrate. Mild basilar atelectasis on the left. No acute bony abnormality. Carotid vascular calcification . Sliding hiatal hernia. IMPRESSION: 1. Prior median sternotomy. Cardiomegaly with pulmonary vascular congestion. 2.  Mild left base atelectasis. 3. Sliding hiatal hernia. 4. Carotid vascular disease. Electronically Signed   By: Marcello Moores  Register   On: 04/05/2016 14:53    EKG: Independently reviewed. Sinus rhythm  Assessment/Plan Acute respiratory failure hypoxia 2/2 Diastolic congestive heart failure exacerbation: Acute. Patient not on oxygen at home presents with O2 saturations was 87% on room air. Last  echocardiogram performed in 2014 EF of 55% with grade 2 dFx. Patient is followed by Dr. Kerin Ransom of cardiology outpatient setting. - Admit to telemetry bed - Heart failure protocol initiated - Strict I&Os and daily weights - Incentive spirometry - Lasix 40 mg IV BID - Check echocardiogram - DuoNeb's prn sob/wheezing - Trend cardiac troponins - May want to consult cardiology in a.m.  Right upper quadrant abdominal pain: Acute. Patient notes having abdominal distention and appears to be right upper quadrant discomfort. Lipase and liver function studies within normal limits. CT scan of the abdomen and pelvis did not show any acute signs to explain symptoms. Patient was noted to have some abdominal hernias. - Continue to monitor  Leukocytosis: WBC elevated at 10.8 on admission. Urinalysis was seen to be abnormal. - Follow-up urine culture - CBC in a.m.  HTN: Initial blood pressures elevated to 191/79.  - Continue amlodipine and clonidine   Diabetes mellitus type 2: Initial blood sugars noted be 202. Last hemoglobin A1c on 01/20/2016 was noted to be 7.  - Hypoglycemic protocol  - Continued home regimen of Levemir  - Cbg q AC and HS with Moderate SSI  CKD stage III:  stable. Initial creatinine 1.63 which appears new patient baseline 1.7.  - Continue to monitor  CAD - Continue aspirin  GERD with hiatal hernia -  pharmacy substitution of Protonix  DVT prophylaxis: Heparin   Code Status: Full  Family Communication: no family present at bedside   Disposition Plan: [place] discharge home once medically stable Consults called: None  Admission status: Inpatient   Norval Morton MD [facility] Pager [phone]  If 7PM-7AM, please contact night-coverage [email or web address] Password [facility]  04/05/2016, 7:28 PM

## 2016-04-05 NOTE — ED Notes (Signed)
EDP at bedside  

## 2016-04-05 NOTE — ED Notes (Signed)
Patient transported to CT 

## 2016-04-05 NOTE — ED Provider Notes (Signed)
[facility] DEPT Provider Note   CSN: [id number] Arrival date & time: 04/05/16  1404     History   Chief Complaint Chief Complaint  Patient presents with  . Abdominal Pain    HPI Laura Best is a 76 y.o. female.  HPI Patient presents with abdominal pain. Has had nausea. No real change in her bowel movements. Pain is dull. Abdomen is more distended. Previous history of bowel obstructions. No chest pain. No real trouble breathing. Oxygen saturation 91% here. No dysuria. She has had a decreased appetite. Pain is her mid to lower abdomen.   Past Medical History:  Diagnosis Date  . Arthritis    "hands, feet" (02/17/2016)  . CHF (congestive heart failure) ([place])   . CKD (chronic kidney disease) stage 3, GFR 30-59 ml/min 12/25/2012  . COPD (chronic obstructive pulmonary disease) ([place])   . Diastolic heart failure ([place])   . Hyperlipidemia   . Hypertension   . Osteoporosis   . Osteoporosis   . Pneumonia    "several times" (02/17/2016)  . Small bowel obstruction 02/17/2016  . Tricuspid regurgitation    Echo 05/02/06-Nml LV. Mild MR. Mod TR  . Type II diabetes mellitus ([place])   . Vitamin D deficiency     Patient Active Problem List   Diagnosis Date Noted  . SBO (small bowel obstruction) 02/17/2016  . Chronic diastolic congestive heart failure ([place]) 02/17/2016  . Diabetes mellitus with complication ([place]) 46/27/0350  . Pyuria 02/17/2016  . GERD (gastroesophageal reflux disease) 04/05/2015  . Diastolic dysfunction 09/38/1829  . Acute diverticulitis 05/03/2014  . Acute appendicitis with peritoneal abscess 09/27/2013  . Bradycardia 04/02/2013  . Hyperkalemia 01/22/2013  . Acute renal failure ([place]) 01/21/2013  . Anemia of chronic disease 01/21/2013  . Acute bronchitis 01/20/2013  . CHF exacerbation ([place]) 01/19/2013  . CKD (chronic kidney disease) stage 3, GFR 30-59 ml/min 12/25/2012  . Screening for colorectal cancer 12/25/2012  . Breast cancer screening 12/25/2012  .  Vitamin D deficiency   . Osteoporosis   . Postop check 08/13/2012  . Appendiceal abscess 07/30/2012  . Fever, unspecified 07/19/2012  . Abdominal pain, other specified site 07/19/2012  . SOB (shortness of breath) 07/19/2012  . ARF (acute renal failure) ([place]) 07/19/2012  . Abscess, appendix 07/19/2012  . Acute diastolic heart failure ([place]) 02/04/2011  . Type 2 diabetes mellitus with renal manifestations ([place]) 02/02/2011  . Pulmonary hypertension 02/02/2011  . COPD exacerbation ([place]) 02/02/2011  . Hypokalemia 02/02/2011  . Normocytic anemia 02/02/2011  . Essential hypertension   . Hyperlipidemia     Past Surgical History:  Procedure Laterality Date  . HEART CHAMBER REVISION     patched hole --[place]  . LAPAROSCOPIC APPENDECTOMY N/A 09/27/2013   Procedure: APPENDECTOMY - CONVERTED FROM LAPAROSCOPIC;  Surgeon: Donnie Mesa, MD;  Location: [facility];  Service: General;  Laterality: N/A;  . TUBAL LIGATION      OB History    No data available       Home Medications    Prior to Admission medications   Medication Sig Start Date End Date Taking? Authorizing Provider  acetaminophen (TYLENOL) 500 MG tablet Take 500 mg by mouth every 6 (six) hours as needed for pain.    Historical Provider, MD  amLODipine (NORVASC) 10 MG tablet Take 1 tablet (10 mg total) by mouth daily. 07/12/15   Lendon Colonel, NP  aspirin EC 81 MG tablet Take 81 mg by mouth daily.    Historical Provider, MD  Blood Glucose Monitoring Suppl (ONE TOUCH ULTRA SYSTEM KIT) w/Device KIT 1 meter 02/11/16   Dorena Bodo, PA-C  Cholecalciferol (VITAMIN D) 2000 UNITS CAPS Take 1 capsule by mouth daily.    Historical Provider, MD  cloNIDine (CATAPRES) 0.2 MG tablet TAKE ONE TABLET BY MOUTH TWICE DAILY 11/10/15   Jodelle Gross, NP  furosemide (LASIX) 40 MG tablet TAKE ONE TABLET BY MOUTH ONCE DAILY 05/12/15   Dorena Bodo, PA-C  gabapentin (NEURONTIN) 100 MG capsule Take 1-2 capsules at bedtime for neuropathy Patient  taking differently: Take 100-200 mg by mouth at bedtime.  08/27/15   Salley Scarlet, MD  glucose blood test strip 1 each by Other route 4 (four) times daily -  before meals and at bedtime. Use as instructed 10/18/15   Dorena Bodo, PA-C  glucose blood test strip Use as directed. Check blood sugar 3 times daily before meals and at bedtime. 02/11/16   Patriciaann Clan Dixon, PA-C  insulin aspart (NOVOLOG) 100 UNIT/ML FlexPen Take 7 units with luch and supper.  Inject 15 minutes prior to meal or immediately after meal. 01/21/16   Dorena Bodo, PA-C  Lancets [facility] ULTRASOFT) lancets Use as instructed 02/11/16   Dorena Bodo, PA-C  LEVEMIR FLEXTOUCH 100 UNIT/ML Pen INJECT 22 UNITS SUBCUTANEOUSLY ONCE DAILY AT  10  PM 11/10/15   Dorena Bodo, PA-C  lisinopril (PRINIVIL,ZESTRIL) 2.5 MG tablet Take 2.5 mg by mouth daily.  07/02/15   Historical Provider, MD  omeprazole (PRILOSEC) 20 MG capsule TAKE ONE CAPSULE BY MOUTH ONCE DAILY 08/16/15   Dorena Bodo, PA-C  polyethylene glycol [facility] / GLYCOLAX) packet Take 17 g by mouth daily. 02/20/16   Rolly Salter, MD  psyllium (HYDROCIL/METAMUCIL) 95 % PACK Take 1 packet by mouth 3 (three) times daily. 02/19/16   Rolly Salter, MD  RELION SHORT PEN NEEDLES 31G X 8 MM MISC USE ONE PEN NEEDLE ONCE DAILY AS DIRECTED 03/10/14   Dorena Bodo, PA-C  simvastatin (ZOCOR) 20 MG tablet TAKE ONE TABLET BY MOUTH ONCE DAILY AT BEDTIME 05/12/15   Dorena Bodo, PA-C    Family History Family History  Problem Relation Age of Onset  . Cancer Father   . Diabetes Father   . Cancer Brother     Brain  . Cancer Sister     abdominal fat    Social History Social History  Substance Use Topics  . Smoking status: Former Smoker    Packs/day: 0.50    Years: 50.00    Types: Cigarettes    Quit date: 01/19/2009  . Smokeless tobacco: Never Used  . Alcohol use No     Allergies   Ace inhibitors; Actos [pioglitazone]; Aspirin; and Metformin and related   Review of Systems Review of  Systems  Constitutional: Positive for appetite change.  HENT: Negative for congestion.   Respiratory: Negative for cough and shortness of breath.   Cardiovascular: Negative for chest pain.  Gastrointestinal: Positive for abdominal distention, abdominal pain and nausea.  Genitourinary: Negative for dysuria.  Musculoskeletal: Negative for back pain.  Skin: Negative for wound.  Neurological: Negative for numbness.  Hematological: Negative for adenopathy.  Psychiatric/Behavioral: Negative for confusion.     Physical Exam Updated Vital Signs BP 175/93   Pulse 92   Temp 98.2 F (36.8 C) (Oral)   Resp 23   SpO2 94%   Physical Exam  Constitutional: She appears well-developed.  HENT:  Head: Atraumatic.  Eyes: Pupils  are equal, round, and reactive to light.  Neck: Neck supple.  Cardiovascular: Normal rate.   Pulmonary/Chest: Effort normal.  Abdominal: She exhibits distension.  Abdomen is distended. Some mild diffuse tenderness. No hernias palpated.  Musculoskeletal: She exhibits no edema.  Neurological: She is alert.  Skin: Skin is warm. Capillary refill takes less than 2 seconds.  Psychiatric: She has a normal mood and affect.     ED Treatments / Results  Labs (all labs ordered are listed, but only abnormal results are displayed) Labs Reviewed  COMPREHENSIVE METABOLIC PANEL - Abnormal; Notable for the following:       Result Value   Glucose, Bld 202 (*)    Creatinine, Ser 1.63 (*)    GFR calc non Af Amer 30 (*)    GFR calc Af Amer 34 (*)    All other components within normal limits  URINALYSIS, ROUTINE W REFLEX MICROSCOPIC - Abnormal; Notable for the following:    Color, Urine STRAW (*)    Hgb urine dipstick SMALL (*)    Protein, ur 30 (*)    Leukocytes, UA TRACE (*)    Bacteria, UA RARE (*)    Squamous Epithelial / LPF 0-5 (*)    All other components within normal limits  CBC WITH DIFFERENTIAL/PLATELET - Abnormal; Notable for the following:    WBC 10.8 (*)     Neutro Abs 8.8 (*)    All other components within normal limits  LIPASE, BLOOD    EKG  EKG Interpretation None       Radiology Dg Chest 2 View  Result Date: 04/05/2016 CLINICAL DATA:  Shortness of breath. EXAM: CHEST  2 VIEW COMPARISON:  02/17/2016 . FINDINGS: Median sternotomy. Cardiomegaly with pulmonary vascular prominence. No focal alveolar infiltrate. Mild basilar atelectasis on the left. No acute bony abnormality. Carotid vascular calcification . Sliding hiatal hernia. IMPRESSION: 1. Prior median sternotomy. Cardiomegaly with pulmonary vascular congestion. 2.  Mild left base atelectasis. 3. Sliding hiatal hernia. 4. Carotid vascular disease. Electronically Signed   By: Marcello Moores  Register   On: 04/05/2016 14:53    Procedures Procedures (including critical care time)  Medications Ordered in ED Medications  iopamidol (ISOVUE-300) 61 % injection (not administered)  iopamidol (ISOVUE-300) 61 % injection (not administered)  ondansetron (ZOFRAN) injection 4 mg (not administered)  sodium chloride 0.9 % bolus 500 mL (0 mLs Intravenous Stopped 04/05/16 1529)  ondansetron (ZOFRAN) injection 4 mg (4 mg Intravenous Given 04/05/16 1429)     Initial Impression / Assessment and Plan / ED Course  I have reviewed the triage vital signs and the nursing notes.  Pertinent labs & imaging results that were available during my care of the patient were reviewed by me and considered in my medical decision making (see chart for details).     Patient with nausea and some vomiting. Still having bowel movements. History of bowel obstructions. Abdomen is somewhat distended. Labs relatively reassuring. Ct scan pending. Care turned over to Dr Roderic Palau.  Final Clinical Impressions(s) / ED Diagnoses   Final diagnoses:  Abdominal pain, unspecified abdominal location    New Prescriptions New Prescriptions   No medications on file     Davonna Belling, MD 04/05/16 1607

## 2016-04-05 NOTE — ED Triage Notes (Addendum)
Per EMS- pt here for evaluation of two days of lower abdominal pain radiating into the back. Pt reports off and on nausea with increased bowel movements, normal stool.   Additionally pt noted to be 91% on room air, pt does not wear O2 at home. Pt has hx of CHF

## 2016-04-05 NOTE — ED Provider Notes (Signed)
Patient complains of some shortness of breath when she exerts herself. Patient's room air O2 drop down to 87%. Patient will be admitted for congestive heart failure   Milton Ferguson, MD 04/05/16 1946

## 2016-04-05 NOTE — ED Notes (Addendum)
Pt called out for vomiting.  Upon entering the room this tech discovered an emeiss bag half full of red emesis matching the color of the contrast she was attempting to drink.  The pt also had vomit on her gown and blanket.  After cleaning the pt up and changing her gown and blanket, I informed Dr. Roderic Palau.  He instructed me to call CT and have them scan w/o contrast.  I informed the pt and called CT.

## 2016-04-06 ENCOUNTER — Inpatient Hospital Stay (HOSPITAL_COMMUNITY): Payer: Medicare HMO

## 2016-04-06 DIAGNOSIS — N183 Chronic kidney disease, stage 3 (moderate): Secondary | ICD-10-CM

## 2016-04-06 DIAGNOSIS — D72829 Elevated white blood cell count, unspecified: Secondary | ICD-10-CM | POA: Diagnosis present

## 2016-04-06 DIAGNOSIS — I10 Essential (primary) hypertension: Secondary | ICD-10-CM

## 2016-04-06 DIAGNOSIS — I272 Pulmonary hypertension, unspecified: Secondary | ICD-10-CM

## 2016-04-06 DIAGNOSIS — R109 Unspecified abdominal pain: Secondary | ICD-10-CM

## 2016-04-06 DIAGNOSIS — I5021 Acute systolic (congestive) heart failure: Secondary | ICD-10-CM

## 2016-04-06 DIAGNOSIS — K219 Gastro-esophageal reflux disease without esophagitis: Secondary | ICD-10-CM

## 2016-04-06 DIAGNOSIS — I509 Heart failure, unspecified: Secondary | ICD-10-CM

## 2016-04-06 DIAGNOSIS — I5033 Acute on chronic diastolic (congestive) heart failure: Secondary | ICD-10-CM

## 2016-04-06 LAB — GLUCOSE, CAPILLARY
GLUCOSE-CAPILLARY: 195 mg/dL — AB (ref 65–99)
Glucose-Capillary: 76 mg/dL (ref 65–99)
Glucose-Capillary: 248 mg/dL — ABNORMAL HIGH (ref 65–99)
Glucose-Capillary: 165 mg/dL — ABNORMAL HIGH (ref 65–99)

## 2016-04-06 LAB — CBC WITH DIFFERENTIAL/PLATELET
Basophils Absolute: 0 10*3/uL (ref 0.0–0.1)
Basophils Relative: 1 %
Eosinophils Absolute: 0 10*3/uL (ref 0.0–0.7)
Eosinophils Relative: 0 %
HEMATOCRIT: 32.6 % — AB (ref 36.0–46.0)
Hemoglobin: 10.6 g/dL — ABNORMAL LOW (ref 12.0–15.0)
LYMPHS ABS: 1.3 10*3/uL (ref 0.7–4.0)
LYMPHS PCT: 19 %
MCH: 27.7 pg (ref 26.0–34.0)
MCHC: 32.5 g/dL (ref 30.0–36.0)
MCV: 85.3 fL (ref 78.0–100.0)
MONOS PCT: 8 %
Monocytes Absolute: 0.6 10*3/uL (ref 0.1–1.0)
NEUTROS ABS: 4.9 10*3/uL (ref 1.7–7.7)
Neutrophils Relative %: 72 %
Platelets: 179 10*3/uL (ref 150–400)
RBC: 3.82 MIL/uL — ABNORMAL LOW (ref 3.87–5.11)
RDW: 15.1 % (ref 11.5–15.5)
WBC: 6.8 10*3/uL (ref 4.0–10.5)

## 2016-04-06 LAB — TROPONIN I
TROPONIN I: 0.04 ng/mL — AB (ref <0.03)
TROPONIN I: 0.04 ng/mL — AB (ref <0.03)

## 2016-04-06 LAB — BASIC METABOLIC PANEL
Anion gap: 7 (ref 5–15)
BUN: 19 mg/dL (ref 6–20)
CHLORIDE: 104 mmol/L (ref 101–111)
CO2: 27 mmol/L (ref 22–32)
Calcium: 8.6 mg/dL — ABNORMAL LOW (ref 8.9–10.3)
Creatinine, Ser: 1.83 mg/dL — ABNORMAL HIGH (ref 0.44–1.00)
GFR calc Af Amer: 30 mL/min — ABNORMAL LOW (ref >60)
GFR calc non Af Amer: 26 mL/min — ABNORMAL LOW (ref >60)
Glucose, Bld: 178 mg/dL — ABNORMAL HIGH (ref 65–99)
POTASSIUM: 3.6 mmol/L (ref 3.5–5.1)
SODIUM: 138 mmol/L (ref 135–145)

## 2016-04-06 LAB — ECHOCARDIOGRAM COMPLETE
HEIGHTINCHES: 63 in
WEIGHTICAEL: 2700.8 [oz_av]

## 2016-04-06 MED ORDER — INSULIN DETEMIR 100 UNIT/ML ~~LOC~~ SOLN
20.0000 [IU] | Freq: Every day | SUBCUTANEOUS | Status: DC
  Administered 2016-04-07: 20 [IU] via SUBCUTANEOUS
  Filled 2016-04-06 (×3): qty 0.2

## 2016-04-06 NOTE — Plan of Care (Signed)
Problem: Education: Goal: Ability to demonstrate managment of disease process will improve Outcome: Progressing Pt educated and given "Living Better with Heart Failure" booklet,  Verbalized understanding, Instructed to view heart failure video.

## 2016-04-06 NOTE — Progress Notes (Signed)
Responded to [facility] Consult to provide prayer to patient.  Patient was alert and welcomed visit and prayer. Prayed with patient. Patient indicated that she was in pain and wanting it to go away.  I made patient nurse aware and nurse indicated that she would check to see if patient can have something at this time. Chaplain available as needed.   04/06/16 0946  Clinical Encounter Type  Visited With Patient;Health care provider  Visit Type Initial;Spiritual support  Referral From Other (Comment)  Consult/Referral To ([place])  Spiritual Encounters  Spiritual Needs Prayer;Emotional  Jaclynn Major, [place]

## 2016-04-06 NOTE — Evaluation (Signed)
Physical Therapy Evaluation Patient Details Name: Laura Best MRN: [id number] DOB: Nov 02, 1940 Today's Date: 04/06/2016   History of Present Illness  Pt adm with chf. PMH - DM, HTN, chf, ckd, osteoporosis  Clinical Impression  Pt doing well with mobility and no further PT needed.  Ready for dc from PT standpoint.      Follow Up Recommendations No PT follow up    Equipment Recommendations  None recommended by PT    Recommendations for Other Services       Precautions / Restrictions Precautions Precautions: None Restrictions Weight Bearing Restrictions: No      Mobility  Bed Mobility               General bed mobility comments: Pt up in chair  Transfers Overall transfer level: Modified independent Equipment used: Rolling walker (2 wheeled)                Ambulation/Gait Ambulation/Gait assistance: Modified independent (Device/Increase time) Ambulation Distance (Feet): 250 Feet Assistive device: Rolling walker (2 wheeled);None Gait Pattern/deviations: WFL(Within Functional Limits);Step-through pattern;Decreased stride length Gait velocity: decr Gait velocity interpretation: Below normal speed for age/gender General Gait Details: Steady gait with walker in hall and without device in room  Stairs            Wheelchair Mobility    Modified Rankin (Stroke Patients Only)       Balance Overall balance assessment: No apparent balance deficits (not formally assessed)                                           Pertinent Vitals/Pain Pain Assessment: No/denies pain    Home Living Family/patient expects to be discharged to:: Private residence Living Arrangements: Children;Other relatives Available Help at Discharge: Family;Available PRN/intermittently Type of Home: Mobile home Home Access: Stairs to enter Entrance Stairs-Rails: None Entrance Stairs-Number of Steps: 1 Home Layout: One level Home Equipment: Walker - 2  wheels;Bedside commode      Prior Function Level of Independence: Independent with assistive device(s)         Comments: Uses walker only occasionally     Hand Dominance        Extremity/Trunk Assessment   Upper Extremity Assessment Upper Extremity Assessment: Overall WFL for tasks assessed    Lower Extremity Assessment Lower Extremity Assessment: Overall WFL for tasks assessed       Communication   Communication: No difficulties  Cognition Arousal/Alertness: Awake/alert Behavior During Therapy: WFL for tasks assessed/performed Overall Cognitive Status: Within Functional Limits for tasks assessed                      General Comments      Exercises     Assessment/Plan    PT Assessment Patent does not need any further PT services  PT Problem List         PT Treatment Interventions      PT Goals (Current goals can be found in the Care Plan section)  Acute Rehab PT Goals PT Goal Formulation: All assessment and education complete, DC therapy    Frequency     Barriers to discharge        Co-evaluation               End of Session   Activity Tolerance: Patient tolerated treatment well Patient left: in chair;with call bell/phone within reach Nurse  Communication: Mobility status PT Visit Diagnosis: Difficulty in walking, not elsewhere classified (R26.2)         Time: 9872-1587 PT Time Calculation (min) (ACUTE ONLY): 12 min   Charges:   PT Evaluation $PT Eval Low Complexity: 1 Procedure     PT G CodesShary Decamp [place] May 06, 2016, 4:23 PM [employer] PT [phone]

## 2016-04-06 NOTE — Progress Notes (Signed)
  Echocardiogram 2D Echocardiogram has been performed.  [name] 04/06/2016, 3:33 PM

## 2016-04-06 NOTE — Care Management Note (Signed)
Case Management Note  Patient Details  Name: SARAYA TIREY MRN: [id number] Date of Birth: 09-04-1940  Subjective/Objective:  Admitted with CHF                 Action/Plan: PCP: [name], PA-C; has private insurance with [employer]; with prescription drug coverage; CM following for DCP    Expected Discharge Date:    possibly 04/10/2016              Expected Discharge Plan:  [place]  Discharge planning Services  CM Consult  Status of Service:  In process, will continue to follow  Sherrilyn Rist [phone] 04/06/2016, 1:05 PM

## 2016-04-06 NOTE — Consult Note (Signed)
Cardiology Consult    Patient ID: Laura Best MRN: [id number], DOB/AGE: August 05, 1940   Admit date: 04/05/2016 Date of Consult: 04/06/2016  Primary Physician: [name], PA-C Primary Cardiologist: Dr. Harl Bowie Requesting Provider: Dr. Tana Coast  Reason for Consult: CHF  Patient Profile    Laura Best is a 76 yo female with a PMH significant for diastolic heart failure, HTN, bradycardia, DMII, HLD, COPD, CKD stage 3, and arthritis. She presented to ED with shortness of breath and abdominal pain. Cardiology was consulted for possible CHF exacerbation.   Past Medical History   Past Medical History:  Diagnosis Date  . Arthritis    "hands, feet" (02/17/2016)  . CHF (congestive heart failure) ([place])   . CKD (chronic kidney disease) stage 3, GFR 30-59 ml/min 12/25/2012  . COPD (chronic obstructive pulmonary disease) ([place])   . Diastolic heart failure ([place])   . Hyperlipidemia   . Hypertension   . Osteoporosis   . Osteoporosis   . Pneumonia    "several times" (02/17/2016)  . Small bowel obstruction 02/17/2016  . Tricuspid regurgitation    Echo 05/02/06-Nml LV. Mild MR. Mod TR  . Type II diabetes mellitus ([place])   . Vitamin D deficiency     Past Surgical History:  Procedure Laterality Date  . APPENDECTOMY    . HEART CHAMBER REVISION     patched hole --[place]  . LAPAROSCOPIC APPENDECTOMY N/A 09/27/2013   Procedure: APPENDECTOMY - CONVERTED FROM LAPAROSCOPIC;  Surgeon: Donnie Mesa, MD;  Location: [place];  Service: General;  Laterality: N/A;  . TUBAL LIGATION       Allergies  Allergies  Allergen Reactions  . Ace Inhibitors Other (See Comments)    Hyperkalemia  . Actos [Pioglitazone] Swelling  . Aspirin Other (See Comments)    G.I. Upset.   . Metformin And Related Diarrhea    History of Present Illness    Laura Best is a 76 yo female with a PMH significant for diastolic heart failure, HTN, bradycardia, DMII, HLD, COPD, CKD stage 3, and arthritis. She has had a previous sternotomy,  which is thought to be from an ASD repair by Dr. Servando Snare in 1993 (records unavailable). She is known to our service and  with shortness of breath and abdominal pain. She was recently hospitalized with a SBO that was treated conservatively in Jan 2018. On arrival to the ED, she reported shortness of breath, dyspnea on exertion, nausea, abdominal distention, decreased appetite, and nonproductive cough. She was febrile with a Tmax 100.2, but without leukocytosis. Troponin was mildly elevated 0.03 --> 0.04 --> 0.04. EKG with minimal Q waves in Lead III, although this is difficult to interpret given her IVCD.  CXR with cardiomegaly, previous sternotomy, and pulmonary vascular congestion. She was given lasix and admitted to Medicine service. Cardiology was consulted for CHF exacerbation.   On my interview, she states she has been experiencing worsening dyspnea on exertion when doing her regular chores around the house. This has been going on for a few weeks, since her last hospitalization. She reports normal LE edema. She denies chest pain, palpitation, and syncope. She is compliant on her medications at home. Telemetry shows NSR with bradycardic rate, but episodes of bigeminy.     Inpatient Medications    . amLODipine  10 mg Oral Daily  . aspirin EC  81 mg Oral Daily  . cloNIDine  0.2 mg Oral BID  . furosemide  40 mg Intravenous BID  . heparin  5,000 Units Subcutaneous  Q8H  . insulin aspart  0-15 Units Subcutaneous TID WC  . insulin aspart  0-5 Units Subcutaneous QHS  . insulin detemir  20 Units Subcutaneous Q2200  . ipratropium-albuterol  3 mL Nebulization Once  . pantoprazole  40 mg Oral Daily  . polyethylene glycol  17 g Oral Daily  . simvastatin  20 mg Oral Q breakfast  . sodium chloride flush  3 mL Intravenous Q12H     Outpatient Medications    Prior to Admission medications   Medication Sig Start Date End Date Taking? Authorizing Provider  acetaminophen (TYLENOL) 500 MG tablet Take 500  mg by mouth every 6 (six) hours as needed for pain.   Yes Historical Provider, MD  amLODipine (NORVASC) 10 MG tablet Take 1 tablet (10 mg total) by mouth daily. 07/12/15  Yes Lendon Colonel, NP  aspirin EC 81 MG tablet Take 81 mg by mouth daily.   Yes Historical Provider, MD  Cholecalciferol (VITAMIN D) 2000 UNITS CAPS Take 1 capsule by mouth daily.   Yes Historical Provider, MD  cloNIDine (CATAPRES) 0.2 MG tablet TAKE ONE TABLET BY MOUTH TWICE DAILY 11/10/15  Yes Lendon Colonel, NP  furosemide (LASIX) 40 MG tablet TAKE ONE TABLET BY MOUTH ONCE DAILY 05/12/15  Yes Orlena Sheldon, PA-C  gabapentin (NEURONTIN) 100 MG capsule Take 1-2 capsules at bedtime for neuropathy Patient taking differently: Take 100-200 mg by mouth at bedtime.  08/27/15  Yes Alycia Rossetti, MD  insulin aspart (NOVOLOG) 100 UNIT/ML FlexPen Take 7 units with luch and supper.  Inject 15 minutes prior to meal or immediately after meal. 01/21/16  Yes Orlena Sheldon, PA-C  LEVEMIR FLEXTOUCH 100 UNIT/ML Pen INJECT 22 UNITS SUBCUTANEOUSLY ONCE DAILY AT  10  PM 11/10/15  Yes Orlena Sheldon, PA-C  lisinopril (PRINIVIL,ZESTRIL) 2.5 MG tablet Take 2.5 mg by mouth daily.  07/02/15  Yes Historical Provider, MD  omeprazole (PRILOSEC) 20 MG capsule TAKE ONE CAPSULE BY MOUTH ONCE DAILY 08/16/15  Yes Orlena Sheldon, PA-C  polyethylene glycol (MIRALAX / GLYCOLAX) packet Take 17 g by mouth daily. 02/20/16  Yes Lavina Hamman, MD  psyllium (HYDROCIL/METAMUCIL) 95 % PACK Take 1 packet by mouth 3 (three) times daily. 02/19/16  Yes Lavina Hamman, MD  simvastatin (ZOCOR) 20 MG tablet TAKE ONE TABLET BY MOUTH ONCE DAILY AT BEDTIME 05/12/15  Yes Orlena Sheldon, PA-C  Blood Glucose Monitoring Suppl (ONE TOUCH ULTRA SYSTEM KIT) w/Device KIT 1 meter 02/11/16   Lonie Peak Dixon, PA-C  glucose blood test strip 1 each by Other route 4 (four) times daily -  before meals and at bedtime. Use as instructed 10/18/15   Orlena Sheldon, PA-C  glucose blood test strip Use as directed.  Check blood sugar 3 times daily before meals and at bedtime. 02/11/16   Orlena Sheldon, PA-C  Lancets [facility] ULTRASOFT) lancets Use as instructed 02/11/16   Orlena Sheldon, PA-C  RELION SHORT PEN NEEDLES 31G X 8 MM MISC USE ONE PEN NEEDLE ONCE DAILY AS DIRECTED 03/10/14   Orlena Sheldon, PA-C     Family History     Family History  Problem Relation Age of Onset  . Cancer Father   . Diabetes Father   . Cancer Brother     Brain  . Cancer Sister     abdominal fat    Social History    Social History   Social History  . Marital status: Widowed  Spouse name: N/A  . Number of children: N/A  . Years of education: N/A   Occupational History  . Not on file.   Social History Main Topics  . Smoking status: Former Smoker    Packs/day: 0.50    Years: 50.00    Types: Cigarettes    Quit date: 01/19/2009  . Smokeless tobacco: Never Used  . Alcohol use No  . Drug use: No  . Sexual activity: No   Other Topics Concern  . Not on file   Social History Narrative   Entered 10/2013:   She has never driven.   She lives with her son.     Review of Systems    General:  No chills, fever, night sweats or weight changes.  Cardiovascular:  No chest pain, Positive for dyspnea on exertion and edema, No orthopnea, palpitations, paroxysmal nocturnal dyspnea. Dermatological: No rash, lesions/masses Respiratory: Positive cough and dyspnea Urologic: No hematuria, dysuria Abdominal:   Positive nausea, No vomiting, diarrhea, bright red blood per rectum, melena, or hematemesis Neurologic:  No visual changes, changes in mental status. All other systems reviewed and are otherwise negative except as noted above.  Physical Exam    Blood pressure (!) 125/55, pulse 64, temperature 98.5 F (36.9 C), temperature source Oral, resp. rate 18, height '5\' 3"'$  (1.6 m), weight 168 lb 12.8 oz (76.6 kg), SpO2 (!) 88 %.  General: Pleasant, NAD Psych: Normal affect. Neuro: Alert and oriented X 3. Moves all  extremities spontaneously. HEENT: Normal  Neck: Supple without bruits or moderate JVD. Lungs:  Resp regular and unlabored, CTA, but diminished throughout Heart: RRR no s3, s4, or murmurs, but distant heart sounds Abdomen: Soft, non-tender, non-distended, BS + x 4.  Extremities: No clubbing, cyanosis or Trace B LE edema. DP/PT/Radials 1+ and equal bilaterally.  Labs    Troponin (Point of Care Test) No results for input(s): TROPIPOC in the last 72 hours.  Recent Labs  04/05/16 1406 04/05/16 2201 04/06/16 0219 04/06/16 0756  TROPONINI <0.03 0.03* 0.04* 0.04*   Lab Results  Component Value Date   WBC 6.8 04/06/2016   HGB 10.6 (L) 04/06/2016   HCT 32.6 (L) 04/06/2016   MCV 85.3 04/06/2016   PLT 179 04/06/2016    Recent Labs Lab 04/05/16 1406 04/06/16 0219  NA 139 138  K 3.8 3.6  CL 102 104  CO2 26 27  BUN 18 19  CREATININE 1.63* 1.83*  CALCIUM 9.4 8.6*  PROT 7.9  --   BILITOT 0.9  --   ALKPHOS 97  --   ALT 14  --   AST 23  --   GLUCOSE 202* 178*   Lab Results  Component Value Date   CHOL 159 01/20/2016   HDL 61 01/20/2016   LDLCALC 72 01/20/2016   TRIG 132 01/20/2016   Lab Results  Component Value Date   DDIMER 0.34 04/05/2016     Radiology Studies    Ct Abdomen Pelvis Wo Contrast  Result Date: 04/05/2016 CLINICAL DATA:  Abdominal pain, distention. History of small bowel obstruction. Rule out obstruction. EXAM: CT ABDOMEN AND PELVIS WITHOUT CONTRAST TECHNIQUE: Multidetector CT imaging of the abdomen and pelvis was performed following the standard protocol without IV contrast. COMPARISON:  02/17/2016 FINDINGS: Lower chest: Heart is mildly enlarged. Scarring in the bases. No effusions. Hepatobiliary: No focal hepatic abnormality. Gallbladder unremarkable. Pancreas: No focal abnormality or ductal dilatation. Spleen: No focal abnormality.  Normal size. Adrenals/Urinary Tract: Punctate nonobstructing stone in the midpole of the  right kidney. No hydronephrosis.  Adrenal glands and urinary bladder are unremarkable. Stomach/Bowel: Moderate stool throughout the colon. Sigmoid diverticulosis. No active diverticulitis. No small bowel distention to suggest small bowel obstruction. Stomach decompressed, unremarkable. Vascular/Lymphatic: Heavily calcified aorta and iliac vessels. No aneurysm. No adenopathy. Reproductive: Uterus and adnexa unremarkable.  No mass. Other: No free fluid or free air. Small umbilical and supraumbilical ventral hernias containing fat. Musculoskeletal: No acute bony abnormality. IMPRESSION: No evidence of bowel obstruction. Small umbilical and ventral hernias containing fat, unchanged. Heavily calcified aorta and iliac vessels. No acute findings in the abdomen or pelvis. Electronically Signed   By: Rolm Baptise M.D.   On: 04/05/2016 17:43   Dg Chest 2 View  Result Date: 04/05/2016 CLINICAL DATA:  Shortness of breath. EXAM: CHEST  2 VIEW COMPARISON:  02/17/2016 . FINDINGS: Median sternotomy. Cardiomegaly with pulmonary vascular prominence. No focal alveolar infiltrate. Mild basilar atelectasis on the left. No acute bony abnormality. Carotid vascular calcification . Sliding hiatal hernia. IMPRESSION: 1. Prior median sternotomy. Cardiomegaly with pulmonary vascular congestion. 2.  Mild left base atelectasis. 3. Sliding hiatal hernia. 4. Carotid vascular disease. Electronically Signed   By: Marcello Moores  Register   On: 04/05/2016 14:53    ECG & Cardiac Imaging    EKG 04/05/16: Sinus or ectopic atrial rhythm; IVCD with LAD; Q waves in Lead III  Echocardiogram 04/06/16: pending  Echocardiogram 01/20/13: Study Conclusions - Left ventricle: The cavity size was normal. Wall thickness was increased in a pattern of mild LVH. The estimated ejection fraction was 55%. Wall motion was normal; there were no regional wall motion abnormalities. Features are consistent with a pseudonormal left ventricular filling pattern, with concomitant abnormal  relaxation and increased filling pressure (grade 2 diastolic dysfunction). E/medial e' > 15 suggests LV end diastolic pressure at least 20 mmHg. - Ventricular septum: Mild D shape to the interventricular septum suggesting RV pressure/volume overload. - Aortic valve: There was no stenosis. - Mitral valve: Mildly calcified annulus. Mildly calcified leaflets . Trivial regurgitation. - Left atrium: The atrium was moderately dilated. - Right ventricle: The cavity size was mildly dilated. Systolic function was mildly reduced. - Right atrium: The atrium was mildly dilated. - Pulmonary arteries: PA peak pressure: 20m Hg (S). - Systemic veins: IVC measured 2.3 cm with some respirophasic variation suggesting RA pressure 10 mmHg.  Impressions: - Normal LV size with mild LV hypertrophy. EF 55%. Moderate diastolic dysfunction. Mildly D shaped interventricular septum suggesting RV pressure/volume overload. Mildly dilated RV with mildly decreased systolic function. Mild pulmonary hypertension.   Stress Myoview 02/14/11: IMPRESSION: Negative stress nuclear myocardial study revealing impaired exercise capacity, no significant stress-induced EKG abnormalities, normal left ventricular size and normal overall left ventricular systolic function with a segmental wall motion abnormality of uncertain significance as described.  By scintigraphic imaging, there was normal myocardial perfusion without evidence for ischemia or infarction.  Other findings as noted.  Assessment & Plan    1. Grade 2 diastolic heart failure / shortness of breath - home medications include ASA, norvasc, clonidine, lasix, lisinopril, and zocor - she has been compliant on all medications - BNP: 423; D-Dimer negative, lipase WNL - diuresing well: admission weight:170, today 168 lbs; previous weight in clinic on 07/12/15 was 174 lb; will need to establish a new dry weight - continue to diurese with lasix  while monitoring kidney function; currently on 40 mg lasix BID - repeat echocardiogram pending Heart failure exacerbation likely secondary to recent illness and hospitalization for SBO in Jan  2018. On exam, she is not severely volume overloaded, maintaining on room air, and reports good urine output. She states she does not have dyspnea at rest, but still gets short of breath with ambulation and activity following increased lasix regimen started in ED. Recommend influenza swab to rule out. Her SOB and dyspnea on exertion seem out of proportion to her exam if only attributed to CHF exacerbation. CXR without signs of PNA. Mild troponin elevation likely demand ischemia secondary to CHF exacerbation.    2. CKD stage III - sCr on admission 1.63 --> 1.83 - baseline appears to be 1.60-1.79 - low urine output   3. HTN - continue home meds - BP 149/54 - 125/55   4. HLD - continue zocor   5. DMII - continue insulin regimen, per primary team   Signed, Ledora Bottcher, PA-C 04/06/2016, 1:03 PM   I have seen and examined the patient along with Ledora Bottcher, PA-C.  I have reviewed the chart, notes and new data.  I agree with PA's note.  Key new complaints: dyspnea is a little better Key examination changes: RRR, paradoxically split S2 with loud P2, otherwise normal CV exam. Key new findings / data: reviewed the echo. There is indeed evidence of LVH and diastolic dysfunction with elevated filling pressures. There is evidence of significant RV dilation and RV hypertrophy and at least moderate pulmonary HTN. These may be residual abnormalities from delayed ASD or VSD closure. Have requested old surgical report, which I hope we can retrieve by tomorrow.  PLAN: Continue diuresis. Review old surgical report. Consider right heart cath to directly measure PAP, PVR if dyspnea persists despite diuresis, especially if there is further worsening of renal function. Need to evaluate for CAD as cause  of diastolic HF, especially in view of marked visceral artery calcification on abdominal CT and presence of coronary calcification on chest CT 2013. Last nuclear study was in 2013 (normal). Would like to avoid coronary angiography if possible due to risk of contrast induced nephrotoxicity.  Sanda Klein, MD, [place] [phone] 04/06/2016, 6:10 PM

## 2016-04-06 NOTE — Progress Notes (Signed)
Inpatient Diabetes Program Recommendations  AACE/ADA: New Consensus Statement on Inpatient Glycemic Control (2015)  Target Ranges:  Prepandial:   less than 140 mg/dL      Peak postprandial:   less than 180 mg/dL (1-2 hours)      Critically ill patients:  140 - 180 mg/dL  Results for Laura Best, ANSELMO (MRN [id number]) as of 04/06/2016 11:18  Ref. Range 04/05/2016 21:36 04/06/2016 07:48  Glucose-Capillary Latest Ref Range: 65 - 99 mg/dL 225 (H) 76    Review of Glycemic Control  Diabetes history: DM2 Outpatient Diabetes medications: Levemir 22 units QHS, Novolog 7 units with lunch, Novolog 7 units with supper Current orders for Inpatient glycemic control: Levemir 22 units QHS, Novolog 0-15 units TID with meals, Novolog 0-5 units QHS  Inpatient Diabetes Program Recommendations: Insulin - Basal: Noted fasting glucose 76 mg/dl this morning. Please consider decreasing Levemir to 20 units QHS.  Thanks, Barnie Alderman, RN, MSN, CDE Diabetes Coordinator Inpatient Diabetes Program [phone] (Team Pager from 8am to 5pm)

## 2016-04-06 NOTE — Progress Notes (Signed)
Pt's trop=0.04, pt denied any chest pain. Tylene Fantasia NP made aware.

## 2016-04-06 NOTE — Progress Notes (Signed)
[facility]                                                                              Patient Demographics  Laura Best, is a 76 y.o. female, DOB - January 14, 1941, BJY:[id number]  Admit date - 04/05/2016   Admitting Physician Norval Morton, MD  Outpatient Primary MD for the patient is [name], PA-C  Outpatient specialists:   LOS - 1  days    Chief Complaint  Patient presents with  . Abdominal Pain       Brief summary   Patient is a 76 year old female with  HTN, HLD, diastolic CHF, CAD, and SBO; who presents with complaints of shortness of breath and abdominal pain. She complains of pain in the right upper quadrant of her abdomen that is dull and will sometimes moved her back. Patient recently had a small bowel obstruction in 01/2016, but was resolved with bowel rest and did not require any surgical intervention. Patient reported shortness of breath, easily getting winded with doing the regular tasks at home, nausea, abdominal distention, decreased appetite, intermittent dry nonproductive cough. In ED, patient was seen to be febrile to 100.26F, pulse 92-99, respirations 16-28, blood pressure elevated up to 191/79, and O2 saturations as low as 87% on room air. Chest x-ray showed pulmonary vascular congestion and cardiomegaly. CT scan of the abdomen revealed some small abdominal hernias and no signs of SBO. Patient was given 40 mg of Lasix IV in the ED   Assessment & Plan    Principal Problem:   Acute respiratory failure with hypoxia, Likely due to acute on chronic diastolic congestive heart failure exacerbation:  - Patient not on home O2 descended with O2 sats of 87% on room air.  - Last 2-D echo in 2014 showed EF of 55% with grade 2 diastolic dysfunction. mildly elevated troponins 0.04, BNP 423, chest x-ray with pulmonary edema. - Follow 2-D echo, continue IV Lasix for diuresis, - Strict I's and O's and daily weights - Cardiology consulted  Active  problem Right upper quadrant abdominal pain - Patient notes having abdominal distention and appears to be right upper quadrant discomfort. -  Lipase and liver function studies within normal limits. -  CT scan of the abdomen and pelvis did not show any acute signs to explain symptoms.   HTN:  - Continue amlodipine and clonidine   Diabetes mellitus type 2:  -  Last hemoglobin A1c on 01/20/2016 was noted to be 7.  - Continue Levemir 20 units at bedtime, sliding scale insulin   CKD stage III:  stable. Initial creatinine 1.63 which appears new patient baseline 1.7.  -Follow closely with the diuresis   CAD - Continue aspirin  GERD with hiatal hernia -   continue PPI  Code Status: Full code DVT Prophylaxis:  heparin  Family Communication: Discussed in detail with the patient, all imaging results, lab results explained to the patient    Disposition Plan:   Time Spent in minutes  25 minutes  Procedures:    Consultants:   Cardiology  Antimicrobials:      Medications  Scheduled Meds: . amLODipine  10 mg  Oral Daily  . aspirin EC  81 mg Oral Daily  . cloNIDine  0.2 mg Oral BID  . furosemide  40 mg Intravenous BID  . heparin  5,000 Units Subcutaneous Q8H  . insulin aspart  0-15 Units Subcutaneous TID WC  . insulin aspart  0-5 Units Subcutaneous QHS  . insulin detemir  20 Units Subcutaneous Q2200  . ipratropium-albuterol  3 mL Nebulization Once  . pantoprazole  40 mg Oral Daily  . polyethylene glycol  17 g Oral Daily  . simvastatin  20 mg Oral Q breakfast  . sodium chloride flush  3 mL Intravenous Q12H   Continuous Infusions: PRN Meds:.sodium chloride, acetaminophen, gabapentin, ipratropium-albuterol, ondansetron (ZOFRAN) IV, sodium chloride flush   Antibiotics   Anti-infectives    None        Subjective:   Laura Best was seen and examined today.Shortness of breath slightly improving today, no chest pain. Patient denies dizziness,  abdominal pain,  N/V/D/C, new weakness, numbess, tingling. No acute events overnight.    Objective:   Vitals:   04/05/16 2240 04/06/16 0611 04/06/16 0900 04/06/16 1125  BP: (!) 164/78 137/64 (!) 149/54 (!) 125/55  Pulse:   76 64  Resp:  17 18 18   Temp:  98.8 F (37.1 C) 98.4 F (36.9 C) 98.5 F (36.9 C)  TempSrc:  Oral Oral Oral  SpO2:  95% 97% (!) 88%  Weight:  76.6 kg (168 lb 12.8 oz)    Height:        Intake/Output Summary (Last 24 hours) at 04/06/16 1232 Last data filed at 04/06/16 0945  Gross per 24 hour  Intake             1220 ml  Output             1150 ml  Net               70 ml     Wt Readings from Last 3 Encounters:  04/06/16 76.6 kg (168 lb 12.8 oz)  02/19/16 77.9 kg (171 lb 11.8 oz)  01/20/16 76.7 kg (169 lb)     Exam  General: Alert and oriented x 3, NAD  HEENT:    Neck: Supple, no JVD, no masses  Cardiovascular: S1 S2 auscultated, no rubs, murmurs or gallops. Regular rate and rhythm.  Respiratory: bibasilar crackles with mild expiratory wheezing  Gastrointestinal: Soft, nontender, nondistended, + bowel sounds  Ext: no cyanosis clubbing or edema  Neuro: AAOx3, Cr N's II- XII. Strength 5/5 upper and lower extremities bilaterally  Skin: No rashes  Psych: Normal affect and demeanor, alert and oriented x3    Data Reviewed:  I have personally reviewed following labs and imaging studies  Micro Results No results found for this or any previous visit (from the past 240 hour(s)).  Radiology Reports Ct Abdomen Pelvis Wo Contrast  Result Date: 04/05/2016 CLINICAL DATA:  Abdominal pain, distention. History of small bowel obstruction. Rule out obstruction. EXAM: CT ABDOMEN AND PELVIS WITHOUT CONTRAST TECHNIQUE: Multidetector CT imaging of the abdomen and pelvis was performed following the standard protocol without IV contrast. COMPARISON:  02/17/2016 FINDINGS: Lower chest: Heart is mildly enlarged. Scarring in the bases. No effusions. Hepatobiliary: No focal hepatic  abnormality. Gallbladder unremarkable. Pancreas: No focal abnormality or ductal dilatation. Spleen: No focal abnormality.  Normal size. Adrenals/Urinary Tract: Punctate nonobstructing stone in the midpole of the right kidney. No hydronephrosis. Adrenal glands and urinary bladder are unremarkable. Stomach/Bowel: Moderate stool throughout the colon. Sigmoid diverticulosis.  No active diverticulitis. No small bowel distention to suggest small bowel obstruction. Stomach decompressed, unremarkable. Vascular/Lymphatic: Heavily calcified aorta and iliac vessels. No aneurysm. No adenopathy. Reproductive: Uterus and adnexa unremarkable.  No mass. Other: No free fluid or free air. Small umbilical and supraumbilical ventral hernias containing fat. Musculoskeletal: No acute bony abnormality. IMPRESSION: No evidence of bowel obstruction. Small umbilical and ventral hernias containing fat, unchanged. Heavily calcified aorta and iliac vessels. No acute findings in the abdomen or pelvis. Electronically Signed   By: Rolm Baptise M.D.   On: 04/05/2016 17:43   Dg Chest 2 View  Result Date: 04/05/2016 CLINICAL DATA:  Shortness of breath. EXAM: CHEST  2 VIEW COMPARISON:  02/17/2016 . FINDINGS: Median sternotomy. Cardiomegaly with pulmonary vascular prominence. No focal alveolar infiltrate. Mild basilar atelectasis on the left. No acute bony abnormality. Carotid vascular calcification . Sliding hiatal hernia. IMPRESSION: 1. Prior median sternotomy. Cardiomegaly with pulmonary vascular congestion. 2.  Mild left base atelectasis. 3. Sliding hiatal hernia. 4. Carotid vascular disease. Electronically Signed   By: Marcello Moores  Register   On: 04/05/2016 14:53    Lab Data:  CBC:  Recent Labs Lab 04/05/16 1406 04/06/16 0219  WBC 10.8* 6.8  NEUTROABS 8.8* 4.9  HGB 12.8 10.6*  HCT 39.6 32.6*  MCV 85.3 85.3  PLT 205 732   Basic Metabolic Panel:  Recent Labs Lab 04/05/16 1406 04/06/16 0219  NA 139 138  K 3.8 3.6  CL 102 104    CO2 26 27  GLUCOSE 202* 178*  BUN 18 19  CREATININE 1.63* 1.83*  CALCIUM 9.4 8.6*   GFR: Estimated Creatinine Clearance: 26 mL/min (A) (by C-G formula based on SCr of 1.83 mg/dL (H)). Liver Function Tests:  Recent Labs Lab 04/05/16 1406  AST 23  ALT 14  ALKPHOS 97  BILITOT 0.9  PROT 7.9  ALBUMIN 4.0    Recent Labs Lab 04/05/16 1406  LIPASE 11   No results for input(s): AMMONIA in the last 168 hours. Coagulation Profile: No results for input(s): INR, PROTIME in the last 168 hours. Cardiac Enzymes:  Recent Labs Lab 04/05/16 1406 04/05/16 2201 04/06/16 0219 04/06/16 0756  TROPONINI <0.03 0.03* 0.04* 0.04*   BNP (last 3 results) No results for input(s): PROBNP in the last 8760 hours. HbA1C: No results for input(s): HGBA1C in the last 72 hours. CBG:  Recent Labs Lab 04/05/16 2136 04/06/16 0748 04/06/16 1140  GLUCAP 225* 76 248*   Lipid Profile: No results for input(s): CHOL, HDL, LDLCALC, TRIG, CHOLHDL, LDLDIRECT in the last 72 hours. Thyroid Function Tests: No results for input(s): TSH, T4TOTAL, FREET4, T3FREE, THYROIDAB in the last 72 hours. Anemia Panel: No results for input(s): VITAMINB12, FOLATE, FERRITIN, TIBC, IRON, RETICCTPCT in the last 72 hours. Urine analysis:    Component Value Date/Time   COLORURINE STRAW (A) 04/05/2016 1500   APPEARANCEUR CLEAR 04/05/2016 1500   LABSPEC 1.009 04/05/2016 1500   PHURINE 5.0 04/05/2016 1500   GLUCOSEU NEGATIVE 04/05/2016 1500   HGBUR SMALL (A) 04/05/2016 1500   BILIRUBINUR NEGATIVE 04/05/2016 1500   KETONESUR NEGATIVE 04/05/2016 1500   PROTEINUR 30 (A) 04/05/2016 1500   UROBILINOGEN 0.2 05/03/2014 0645   NITRITE NEGATIVE 04/05/2016 1500   LEUKOCYTESUR TRACE (A) 04/05/2016 1500     RAI,RIPUDEEP M.D. [facility] 04/06/2016, 12:32 PM  Pager: [phone] Between 7am to 7pm - call Pager - [phone]  After 7pm go to [email or web address] - password [email or web address]  Call night coverage person covering after  7pm

## 2016-04-07 DIAGNOSIS — I2721 Secondary pulmonary arterial hypertension: Secondary | ICD-10-CM

## 2016-04-07 DIAGNOSIS — Z9889 Other specified postprocedural states: Secondary | ICD-10-CM

## 2016-04-07 DIAGNOSIS — Z8774 Personal history of (corrected) congenital malformations of heart and circulatory system: Secondary | ICD-10-CM

## 2016-04-07 LAB — BASIC METABOLIC PANEL
Anion gap: 9 (ref 5–15)
BUN: 34 mg/dL — ABNORMAL HIGH (ref 6–20)
CO2: 29 mmol/L (ref 22–32)
Calcium: 8.5 mg/dL — ABNORMAL LOW (ref 8.9–10.3)
Chloride: 98 mmol/L — ABNORMAL LOW (ref 101–111)
Creatinine, Ser: 2.11 mg/dL — ABNORMAL HIGH (ref 0.44–1.00)
GFR calc Af Amer: 25 mL/min — ABNORMAL LOW (ref >60)
GFR calc non Af Amer: 22 mL/min — ABNORMAL LOW (ref >60)
GLUCOSE: 202 mg/dL — AB (ref 65–99)
Potassium: 4 mmol/L (ref 3.5–5.1)
Sodium: 136 mmol/L (ref 135–145)

## 2016-04-07 LAB — GLUCOSE, CAPILLARY
GLUCOSE-CAPILLARY: 235 mg/dL — AB (ref 65–99)
Glucose-Capillary: 170 mg/dL — ABNORMAL HIGH (ref 65–99)
Glucose-Capillary: 264 mg/dL — ABNORMAL HIGH (ref 65–99)
Glucose-Capillary: 126 mg/dL — ABNORMAL HIGH (ref 65–99)

## 2016-04-07 NOTE — Progress Notes (Signed)
[facility]                                                                              Patient Demographics  Laura Best, is a 76 y.o. female, DOB - 05/24/1940, HER:[id number]  Admit date - 04/05/2016   Admitting Physician Norval Morton, MD  Outpatient Primary MD for the patient is [name], PA-C  Outpatient specialists:   LOS - 2  days    Chief Complaint  Patient presents with  . Abdominal Pain       Brief summary   Patient is a 76 year old female with  HTN, HLD, diastolic CHF, CAD, and SBO; who presents with complaints of shortness of breath and abdominal pain. She complains of pain in the right upper quadrant of her abdomen that is dull and will sometimes moved her back. Patient recently had a small bowel obstruction in 01/2016, but was resolved with bowel rest and did not require any surgical intervention. Patient reported shortness of breath, easily getting winded with doing the regular tasks at home, nausea, abdominal distention, decreased appetite, intermittent dry nonproductive cough. In ED, patient was seen to be febrile to 100.49F, pulse 92-99, respirations 16-28, blood pressure elevated up to 191/79, and O2 saturations as low as 87% on room air. Chest x-ray showed pulmonary vascular congestion and cardiomegaly. CT scan of the abdomen revealed some small abdominal hernias and no signs of SBO. Patient was given 40 mg of Lasix IV in the ED   Assessment & Plan    Principal Problem:   Acute respiratory failure with hypoxia, Likely due to acute on chronic diastolic congestive heart failure exacerbation:  - Patient not on home O2, O2 sats of 87% on room air on admit.  - 2-D echo showed EF of 55-60% with grade 2 diastolic dysfunction, right ventricle systolic pressure increased consistent with moderate pulmonary hypertension, PA pressure 49  - Strict I's and O's and daily weights, negative balance of 1.4 L - Creatinine trending up, 2.1 today, hold  Lasix, will follow cardiology recommendations  Active problem Right upper quadrant abdominal pain - Patient notes having abdominal distention and appears to be right upper quadrant discomfort. -  Lipase and liver function studies within normal limits. -  CT scan of the abdomen and pelvis did not show any acute signs to explain symptoms.   HTN:  - Continue amlodipine and clonidine   Diabetes mellitus type 2:  -  Last hemoglobin A1c on 01/20/2016 was noted to be 7.  - Continue Levemir 20 units at bedtime, sliding scale insulin   Ac on CKD stage III:  stable. Initial creatinine 1.63 which appears new patient baseline 1.7.  -Creatinine trending up due to diuresis, hold Lasix  CAD - Continue aspirin  GERD with hiatal hernia -   continue PPI  Code Status: Full code DVT Prophylaxis:  heparin  Family Communication: Discussed in detail with the patient, all imaging results, lab results explained to the patient    Disposition Plan:   Time Spent in minutes  25 minutes  Procedures:    Consultants:   Cardiology  Antimicrobials:  Medications  Scheduled Meds: . amLODipine  10 mg Oral Daily  . aspirin EC  81 mg Oral Daily  . cloNIDine  0.2 mg Oral BID  . furosemide  40 mg Intravenous BID  . heparin  5,000 Units Subcutaneous Q8H  . insulin aspart  0-15 Units Subcutaneous TID WC  . insulin aspart  0-5 Units Subcutaneous QHS  . insulin detemir  20 Units Subcutaneous Q2200  . ipratropium-albuterol  3 mL Nebulization Once  . pantoprazole  40 mg Oral Daily  . polyethylene glycol  17 g Oral Daily  . simvastatin  20 mg Oral Q breakfast  . sodium chloride flush  3 mL Intravenous Q12H   Continuous Infusions: PRN Meds:.sodium chloride, acetaminophen, gabapentin, ipratropium-albuterol, ondansetron (ZOFRAN) IV, sodium chloride flush   Antibiotics   Anti-infectives    None        Subjective:   Laura Best was seen and examined today.Feels a lot better today,  shortness of breath is improving, sitting up in the chair, no acute issues. No chest pain.  Patient denies dizziness,  abdominal pain, N/V/D/C, new weakness, numbess, tingling. No acute events overnight.    Objective:   Vitals:   04/07/16 0040 04/07/16 0551 04/07/16 0800 04/07/16 0900  BP: (!) 102/43 (!) 131/59 (!) 159/59   Pulse: 72 70 61   Resp: 16 18 19    Temp: 98.9 F (37.2 C) 98.5 F (36.9 C) 97.9 F (36.6 C)   TempSrc: Oral Oral Oral   SpO2: 96% 96% 95% 96%  Weight:  77.8 kg (171 lb 8 oz)    Height:        Intake/Output Summary (Last 24 hours) at 04/07/16 1103 Last data filed at 04/07/16 1000  Gross per 24 hour  Intake              580 ml  Output             2091 ml  Net            -1511 ml     Wt Readings from Last 3 Encounters:  04/07/16 77.8 kg (171 lb 8 oz)  02/19/16 77.9 kg (171 lb 11.8 oz)  01/20/16 76.7 kg (169 lb)     Exam  General: Alert and oriented x 3, NAD  HEENT:    Neck: Supple, no JVD, no masses  Cardiovascular: S1 S2 clear, RRR  Respiratory: Decreased breath sound at the bases otherwise fairly clear  Gastrointestinal: Soft, nontender, nondistended, + bowel sounds  Ext: no cyanosis clubbing or edema  Neuro: no new deficits  Skin: No rashes  Psych: Normal affect and demeanor, alert and oriented x3    Data Reviewed:  I have personally reviewed following labs and imaging studies  Micro Results No results found for this or any previous visit (from the past 240 hour(s)).  Radiology Reports Ct Abdomen Pelvis Wo Contrast  Result Date: 04/05/2016 CLINICAL DATA:  Abdominal pain, distention. History of small bowel obstruction. Rule out obstruction. EXAM: CT ABDOMEN AND PELVIS WITHOUT CONTRAST TECHNIQUE: Multidetector CT imaging of the abdomen and pelvis was performed following the standard protocol without IV contrast. COMPARISON:  02/17/2016 FINDINGS: Lower chest: Heart is mildly enlarged. Scarring in the bases. No effusions.  Hepatobiliary: No focal hepatic abnormality. Gallbladder unremarkable. Pancreas: No focal abnormality or ductal dilatation. Spleen: No focal abnormality.  Normal size. Adrenals/Urinary Tract: Punctate nonobstructing stone in the midpole of the right kidney. No hydronephrosis. Adrenal glands and urinary bladder are unremarkable. Stomach/Bowel: Moderate stool  throughout the colon. Sigmoid diverticulosis. No active diverticulitis. No small bowel distention to suggest small bowel obstruction. Stomach decompressed, unremarkable. Vascular/Lymphatic: Heavily calcified aorta and iliac vessels. No aneurysm. No adenopathy. Reproductive: Uterus and adnexa unremarkable.  No mass. Other: No free fluid or free air. Small umbilical and supraumbilical ventral hernias containing fat. Musculoskeletal: No acute bony abnormality. IMPRESSION: No evidence of bowel obstruction. Small umbilical and ventral hernias containing fat, unchanged. Heavily calcified aorta and iliac vessels. No acute findings in the abdomen or pelvis. Electronically Signed   By: Rolm Baptise M.D.   On: 04/05/2016 17:43   Dg Chest 2 View  Result Date: 04/05/2016 CLINICAL DATA:  Shortness of breath. EXAM: CHEST  2 VIEW COMPARISON:  02/17/2016 . FINDINGS: Median sternotomy. Cardiomegaly with pulmonary vascular prominence. No focal alveolar infiltrate. Mild basilar atelectasis on the left. No acute bony abnormality. Carotid vascular calcification . Sliding hiatal hernia. IMPRESSION: 1. Prior median sternotomy. Cardiomegaly with pulmonary vascular congestion. 2.  Mild left base atelectasis. 3. Sliding hiatal hernia. 4. Carotid vascular disease. Electronically Signed   By: Marcello Moores  Register   On: 04/05/2016 14:53    Lab Data:  CBC:  Recent Labs Lab 04/05/16 1406 04/06/16 0219  WBC 10.8* 6.8  NEUTROABS 8.8* 4.9  HGB 12.8 10.6*  HCT 39.6 32.6*  MCV 85.3 85.3  PLT 205 778   Basic Metabolic Panel:  Recent Labs Lab 04/05/16 1406 04/06/16 0219  04/07/16 0538  NA 139 138 136  K 3.8 3.6 4.0  CL 102 104 98*  CO2 26 27 29   GLUCOSE 202* 178* 202*  BUN 18 19 34*  CREATININE 1.63* 1.83* 2.11*  CALCIUM 9.4 8.6* 8.5*   GFR: Estimated Creatinine Clearance: 22.8 mL/min (A) (by C-G formula based on SCr of 2.11 mg/dL (H)). Liver Function Tests:  Recent Labs Lab 04/05/16 1406  AST 23  ALT 14  ALKPHOS 97  BILITOT 0.9  PROT 7.9  ALBUMIN 4.0    Recent Labs Lab 04/05/16 1406  LIPASE 11   No results for input(s): AMMONIA in the last 168 hours. Coagulation Profile: No results for input(s): INR, PROTIME in the last 168 hours. Cardiac Enzymes:  Recent Labs Lab 04/05/16 1406 04/05/16 2201 04/06/16 0219 04/06/16 0756  TROPONINI <0.03 0.03* 0.04* 0.04*   BNP (last 3 results) No results for input(s): PROBNP in the last 8760 hours. HbA1C: No results for input(s): HGBA1C in the last 72 hours. CBG:  Recent Labs Lab 04/06/16 0748 04/06/16 1140 04/06/16 1658 04/06/16 2132 04/07/16 0744  GLUCAP 76 248* 165* 195* 170*   Lipid Profile: No results for input(s): CHOL, HDL, LDLCALC, TRIG, CHOLHDL, LDLDIRECT in the last 72 hours. Thyroid Function Tests: No results for input(s): TSH, T4TOTAL, FREET4, T3FREE, THYROIDAB in the last 72 hours. Anemia Panel: No results for input(s): VITAMINB12, FOLATE, FERRITIN, TIBC, IRON, RETICCTPCT in the last 72 hours. Urine analysis:    Component Value Date/Time   COLORURINE STRAW (A) 04/05/2016 1500   APPEARANCEUR CLEAR 04/05/2016 1500   LABSPEC 1.009 04/05/2016 1500   PHURINE 5.0 04/05/2016 1500   GLUCOSEU NEGATIVE 04/05/2016 1500   HGBUR SMALL (A) 04/05/2016 1500   BILIRUBINUR NEGATIVE 04/05/2016 1500   KETONESUR NEGATIVE 04/05/2016 1500   PROTEINUR 30 (A) 04/05/2016 1500   UROBILINOGEN 0.2 05/03/2014 0645   NITRITE NEGATIVE 04/05/2016 1500   LEUKOCYTESUR TRACE (A) 04/05/2016 1500     RAI,RIPUDEEP M.D. [facility] 04/07/2016, 11:03 AM  Pager: [phone] Between 7am to  7pm - call Pager - [phone]  After 7pm go to [email or web address] - password [email or web address]  Call night coverage person covering after 7pm

## 2016-04-07 NOTE — Progress Notes (Signed)
Paged MD and cardiology both stated to continue to hold 1800 dose of IV lasix.   Laura Best

## 2016-04-07 NOTE — Progress Notes (Signed)
Progress Note  Patient Name: Laura Best Date of Encounter: 04/07/2016  Primary Cardiologist: Dr. Harl Bowie  Subjective   Patient is feeling well; denies chest pain, SOB, and palpitations. She states her dyspnea on exertion is better. Pt states she is ready to discharge home soon, but has only walked slowly in halls.   Inpatient Medications    Scheduled Meds: . amLODipine  10 mg Oral Daily  . aspirin EC  81 mg Oral Daily  . cloNIDine  0.2 mg Oral BID  . furosemide  40 mg Intravenous BID  . heparin  5,000 Units Subcutaneous Q8H  . insulin aspart  0-15 Units Subcutaneous TID WC  . insulin aspart  0-5 Units Subcutaneous QHS  . insulin detemir  20 Units Subcutaneous Q2200  . ipratropium-albuterol  3 mL Nebulization Once  . pantoprazole  40 mg Oral Daily  . polyethylene glycol  17 g Oral Daily  . simvastatin  20 mg Oral Q breakfast  . sodium chloride flush  3 mL Intravenous Q12H   Continuous Infusions:  PRN Meds: sodium chloride, acetaminophen, gabapentin, ipratropium-albuterol, ondansetron (ZOFRAN) IV, sodium chloride flush   Vital Signs    Vitals:   04/07/16 0040 04/07/16 0551 04/07/16 0800 04/07/16 0900  BP: (!) 102/43 (!) 131/59 (!) 159/59   Pulse: 72 70 61   Resp: 16 18 19    Temp: 98.9 F (37.2 C) 98.5 F (36.9 C) 97.9 F (36.6 C)   TempSrc: Oral Oral Oral   SpO2: 96% 96% 95% 96%  Weight:  171 lb 8 oz (77.8 kg)    Height:        Intake/Output Summary (Last 24 hours) at 04/07/16 1131 Last data filed at 04/07/16 1000  Gross per 24 hour  Intake              580 ml  Output             2091 ml  Net            -1511 ml   Filed Weights   04/05/16 2128 04/06/16 0611 04/07/16 0551  Weight: 170 lb 13.7 oz (77.5 kg) 168 lb 12.8 oz (76.6 kg) 171 lb 8 oz (77.8 kg)     Physical Exam   General: Well developed, well nourished, female appearing in no acute distress. Head: Normocephalic, atraumatic.  Neck: Supple without bruits, JVD. Lungs:  Resp regular and  unlabored, CTA. Heart: RRR, S1, S2, no murmur; no rub. Abdomen: Soft, non-tender, non-distended with normoactive bowel sounds. No hepatomegaly. No rebound/guarding. No obvious abdominal masses. Extremities: No clubbing, cyanosis, Trace edema. Distal pedal pulses are 2+ bilaterally. Neuro: Alert and oriented X 3. Moves all extremities spontaneously. Psych: Normal affect.  Labs    Chemistry Recent Labs Lab 04/05/16 1406 04/06/16 0219 04/07/16 0538  NA 139 138 136  K 3.8 3.6 4.0  CL 102 104 98*  CO2 26 27 29   GLUCOSE 202* 178* 202*  BUN 18 19 34*  CREATININE 1.63* 1.83* 2.11*  CALCIUM 9.4 8.6* 8.5*  PROT 7.9  --   --   ALBUMIN 4.0  --   --   AST 23  --   --   ALT 14  --   --   ALKPHOS 97  --   --   BILITOT 0.9  --   --   GFRNONAA 30* 26* 22*  GFRAA 34* 30* 25*  ANIONGAP 11 7 9      Hematology Recent Labs Lab 04/05/16 1406 04/06/16  0219  WBC 10.8* 6.8  RBC 4.64 3.82*  HGB 12.8 10.6*  HCT 39.6 32.6*  MCV 85.3 85.3  MCH 27.6 27.7  MCHC 32.3 32.5  RDW 15.0 15.1  PLT 205 179    Cardiac Enzymes Recent Labs Lab 04/05/16 1406 04/05/16 2201 04/06/16 0219 04/06/16 0756  TROPONINI <0.03 0.03* 0.04* 0.04*   No results for input(s): TROPIPOC in the last 168 hours.   BNP Recent Labs Lab 04/05/16 1406  BNP 423.1*     DDimer  Recent Labs Lab 04/05/16 1406  DDIMER 0.34     Radiology    Ct Abdomen Pelvis Wo Contrast  Result Date: 04/05/2016 CLINICAL DATA:  Abdominal pain, distention. History of small bowel obstruction. Rule out obstruction. EXAM: CT ABDOMEN AND PELVIS WITHOUT CONTRAST TECHNIQUE: Multidetector CT imaging of the abdomen and pelvis was performed following the standard protocol without IV contrast. COMPARISON:  02/17/2016 FINDINGS: Lower chest: Heart is mildly enlarged. Scarring in the bases. No effusions. Hepatobiliary: No focal hepatic abnormality. Gallbladder unremarkable. Pancreas: No focal abnormality or ductal dilatation. Spleen: No focal  abnormality.  Normal size. Adrenals/Urinary Tract: Punctate nonobstructing stone in the midpole of the right kidney. No hydronephrosis. Adrenal glands and urinary bladder are unremarkable. Stomach/Bowel: Moderate stool throughout the colon. Sigmoid diverticulosis. No active diverticulitis. No small bowel distention to suggest small bowel obstruction. Stomach decompressed, unremarkable. Vascular/Lymphatic: Heavily calcified aorta and iliac vessels. No aneurysm. No adenopathy. Reproductive: Uterus and adnexa unremarkable.  No mass. Other: No free fluid or free air. Small umbilical and supraumbilical ventral hernias containing fat. Musculoskeletal: No acute bony abnormality. IMPRESSION: No evidence of bowel obstruction. Small umbilical and ventral hernias containing fat, unchanged. Heavily calcified aorta and iliac vessels. No acute findings in the abdomen or pelvis. Electronically Signed   By: Rolm Baptise M.D.   On: 04/05/2016 17:43   Dg Chest 2 View  Result Date: 04/05/2016 CLINICAL DATA:  Shortness of breath. EXAM: CHEST  2 VIEW COMPARISON:  02/17/2016 . FINDINGS: Median sternotomy. Cardiomegaly with pulmonary vascular prominence. No focal alveolar infiltrate. Mild basilar atelectasis on the left. No acute bony abnormality. Carotid vascular calcification . Sliding hiatal hernia. IMPRESSION: 1. Prior median sternotomy. Cardiomegaly with pulmonary vascular congestion. 2.  Mild left base atelectasis. 3. Sliding hiatal hernia. 4. Carotid vascular disease. Electronically Signed   By: Marcello Moores  Register   On: 04/05/2016 14:53     Telemetry    Sinus rhythm - Personally Reviewed  ECG    EKG 04/05/16: Sinus or ectopic atrial rhythm; IVCD with LAD; Q waves in Lead III - Personally Reviewed   Cardiac Studies   Echocardiogram 04/06/16:  Study Conclusions - Left ventricle: The cavity size was normal. There was moderate   concentric hypertrophy. Systolic function was normal. The   estimated ejection fraction  was in the range of 55% to 60%. Wall   motion was normal; there were no regional wall motion   abnormalities. Features are consistent with a pseudonormal left   ventricular filling pattern, with concomitant abnormal relaxation   and increased filling pressure (grade 2 diastolic dysfunction). - Mitral valve: Calcified annulus. There was trivial regurgitation. - Left atrium: The atrium was moderately dilated. - Right ventricle: The cavity size was moderately dilated. Wall   thickness was normal. Systolic function was mildly to moderately   reduced. - Pulmonary arteries: PA peak pressure: 49 mm Hg (S).  Impressions: - The right ventricular systolic pressure was increased consistent   with moderate pulmonary hypertension.    Echocardiogram  01/20/13: Study Conclusions - Left ventricle: The cavity size was normal. Wall thickness was increased in a pattern of mild LVH. The estimated ejection fraction was 55%. Wall motion was normal; there were no regional wall motion abnormalities. Features are consistent with a pseudonormal left ventricular filling pattern, with concomitant abnormal relaxation and increased filling pressure (grade 2 diastolic dysfunction). E/medial e' > 15 suggests LV end diastolic pressure at least 20 mmHg. - Ventricular septum: Mild D shape to the interventricular septum suggesting RV pressure/volume overload. - Aortic valve: There was no stenosis. - Mitral valve: Mildly calcified annulus. Mildly calcified leaflets . Trivial regurgitation. - Left atrium: The atrium was moderately dilated. - Right ventricle: The cavity size was mildly dilated. Systolic function was mildly reduced. - Right atrium: The atrium was mildly dilated. - Pulmonary arteries: PA peak pressure: 47mm Hg (S). - Systemic veins: IVC measured 2.3 cm with some respirophasic variation suggesting RA pressure 10 mmHg.  Impressions: - Normal LV size with mild LV  hypertrophy. EF 55%. Moderate diastolic dysfunction. Mildly D shaped interventricular septum suggesting RV pressure/volume overload. Mildly dilated RV with mildly decreased systolic function. Mild pulmonary hypertension.   Stress Myoview 02/14/11: IMPRESSION: Negative stress nuclear myocardial study revealing impaired exercise capacity, no significant stress-induced EKG abnormalities, normal left ventricular size and normal overall left ventricular systolic function with a segmental wall motion abnormality of uncertain significance as described. By scintigraphic imaging, there was normal myocardial perfusion without evidence for ischemia or infarction. Other findings as noted.  Patient Profile     76 y.o. female with a PMH significant for diastolic heart failure, HTN, bradycardia, DMII, HLD, COPD, CKD stage 3, and arthritis. She presented to ED with shortness of breath and abdominal pain. Cardiology was consulted for possible CHF exacerbation.  Assessment & Plan    1. Grade 2 diastolic heart failure / shortness of breath - home medications include ASA, norvasc, clonidine, lasix, lisinopril, and zocor - hold lisinopril for acute on CKD - she has been compliant on all medications - BNP: 423; D-Dimer negative, lipase WNL - diuresis status: admission weight:170, today 171 lbs; previous weight in clinic on 07/12/15 was 174 lb; will need to establish a new dry weight; 2.4L output + 3 occurrences  - Will discuss lasix regime with attending in the presence of worsening sCr. - repeat echocardiogram with stable diastolic heart failure and increased PA pressure indicating pulmonary hypertension. This may have been secondary to ASD prior to repair; difficult to evaluate without surgical records.  Heart failure exacerbation likely secondary to recent illness and hospitalization for SBO in Jan 2018. On exam, she is not severely volume overloaded, maintaining on room air, and reports good  urine output. She states she does not have dyspnea at rest, but still gets short of breath with ambulation and activity following increased lasix regimen started in ED. Her SOB and dyspnea on exertion seem out of proportion to her exam if only attributed to CHF exacerbation. CXR without signs of PNA. Mild troponin elevation likely demand ischemia secondary to CHF exacerbation.   She denies chest pain. She will likely benefit from an noninvasive ischemic evaluation, possibly as an outpatient, given her visceral artery calcification on abdominal CT. Avoid contrast dye in presence of acute on chronic kidney injury.  Still awaiting surgical report from 1993 surgery (TCTS to send).     2. CKD stage III - sCr on admission 1.63 --> 1.83 --> 2.11 - baseline appears to be 1.60-1.79 - low urine output - hold  lisinopril   3. HTN - continue home meds - BP 149/54 - 125/55   4. HLD - continue zocor   5. DMII - continue insulin regimen, per primary team   6. DVT PPX - defer to primary team  7. ASD surgical closure at age 63: see surgical report scanned under media today's date.  Signed, Ledora Bottcher , PA-C 11:31 AM 04/07/2016 Pager: [phone]   I have seen and examined the patient along with Ledora Bottcher, PA.  I have reviewed the chart, notes and new data.  I agree with PA's note.  Key new complaints: improvement in dyspnea Key examination changes: no edema Key new findings / data: worsening renal function Records from 1993 confirm that she had ASD closure (large defect 2 x 3 cm, Qp:Qs 3:1) at age 89.  PLAN: Suspect that she has significant secondary pulmonary HTN due to closure of a fairly large ASD at an advanced age. This may explain the disproportionate degree of dyspnea she experienced despite the lack of prominent hypervolemia. There is indeed superimposed diastolic LV dysfunction as well and the widespread presence of atherosclerotic calcifications  raises concern for CAD. She is not a good candidate for left heart cath and coronary angiography with worsening renal function. She would benefit from a right heart cath to separate out the degree of pulmonary venous HTN from left heart diastolic failure from the fixed pulmonary arterial HTN from her ASD closure. If the latter is the dominant abnormality, she may benefit from specific pulmonary vasodilator therapy via CHF clinic. Whether or not right heart cath needs to be done during this admission depends on the evolution of symptoms and renal function over the next 24 hours.  Sanda Klein, MD, [place] [phone] 04/07/2016, 5:36 PM

## 2016-04-07 NOTE — Progress Notes (Signed)
Records obtained for her 1993 sternotomy and ASD repair. Please see scanned media.   Laura Best Laura Best

## 2016-04-07 NOTE — Progress Notes (Signed)
Paged cardiology to inform of rising BUN, MD stated to hold lasix until morning rounds are completed  Crisitn Braldey

## 2016-04-07 NOTE — Progress Notes (Signed)
MD Rai stated do not give lasix   Laura Best Leory Plowman

## 2016-04-08 DIAGNOSIS — I10 Essential (primary) hypertension: Secondary | ICD-10-CM | POA: Diagnosis not present

## 2016-04-08 DIAGNOSIS — I130 Hypertensive heart and chronic kidney disease with heart failure and stage 1 through stage 4 chronic kidney disease, or unspecified chronic kidney disease: Secondary | ICD-10-CM | POA: Diagnosis not present

## 2016-04-08 DIAGNOSIS — I2721 Secondary pulmonary arterial hypertension: Secondary | ICD-10-CM | POA: Diagnosis not present

## 2016-04-08 DIAGNOSIS — I5021 Acute systolic (congestive) heart failure: Secondary | ICD-10-CM | POA: Diagnosis not present

## 2016-04-08 DIAGNOSIS — K219 Gastro-esophageal reflux disease without esophagitis: Secondary | ICD-10-CM | POA: Diagnosis not present

## 2016-04-08 DIAGNOSIS — R109 Unspecified abdominal pain: Secondary | ICD-10-CM | POA: Diagnosis not present

## 2016-04-08 DIAGNOSIS — I5033 Acute on chronic diastolic (congestive) heart failure: Secondary | ICD-10-CM | POA: Diagnosis not present

## 2016-04-08 LAB — BASIC METABOLIC PANEL
Anion gap: 9 (ref 5–15)
BUN: 41 mg/dL — ABNORMAL HIGH (ref 6–20)
CALCIUM: 8.8 mg/dL — AB (ref 8.9–10.3)
CHLORIDE: 98 mmol/L — AB (ref 101–111)
CO2: 30 mmol/L (ref 22–32)
CREATININE: 1.96 mg/dL — AB (ref 0.44–1.00)
GFR calc non Af Amer: 24 mL/min — ABNORMAL LOW (ref >60)
GFR, EST AFRICAN AMERICAN: 28 mL/min — AB (ref >60)
Glucose, Bld: 166 mg/dL — ABNORMAL HIGH (ref 65–99)
Potassium: 4.6 mmol/L (ref 3.5–5.1)
SODIUM: 137 mmol/L (ref 135–145)

## 2016-04-08 LAB — GLUCOSE, CAPILLARY
GLUCOSE-CAPILLARY: 138 mg/dL — AB (ref 65–99)
Glucose-Capillary: 159 mg/dL — ABNORMAL HIGH (ref 65–99)

## 2016-04-08 MED ORDER — FUROSEMIDE 40 MG PO TABS: 40.0000 mg | ORAL_TABLET | Freq: Every day | ORAL | 3 refills | Status: DC

## 2016-04-08 NOTE — Progress Notes (Signed)
SATURATION QUALIFICATIONS: (This note is used to comply with regulatory documentation for home oxygen)  Patient Saturations on Room Air at Rest = 98%  Patient Saturations on Room Air while Ambulating = 98%     

## 2016-04-08 NOTE — Progress Notes (Signed)
SUBJECTIVE: The patient is doing well today. Sob is improved.   At this time, she denies chest pain or any new concerns.  Marland Kitchen amLODipine  10 mg Oral Daily  . aspirin EC  81 mg Oral Daily  . cloNIDine  0.2 mg Oral BID  . furosemide  40 mg Intravenous BID  . heparin  5,000 Units Subcutaneous Q8H  . insulin aspart  0-15 Units Subcutaneous TID WC  . insulin aspart  0-5 Units Subcutaneous QHS  . insulin detemir  20 Units Subcutaneous Q2200  . ipratropium-albuterol  3 mL Nebulization Once  . pantoprazole  40 mg Oral Daily  . polyethylene glycol  17 g Oral Daily  . simvastatin  20 mg Oral Q breakfast  . sodium chloride flush  3 mL Intravenous Q12H     OBJECTIVE: Physical Exam: Vitals:   04/07/16 1243 04/07/16 2003 04/07/16 2204 04/08/16 0435  BP: (!) 131/51 (!) 157/53 (!) 125/47 (!) 126/46  Pulse: (!) 51 62  (!) 50  Resp: 18 20  18   Temp: 98.2 F (36.8 C) 98.2 F (36.8 C)  98.4 F (36.9 C)  TempSrc: Oral Oral  Oral  SpO2: 94% 96%  96%  Weight:    173 lb 6.4 oz (78.7 kg)  Height:        Intake/Output Summary (Last 24 hours) at 04/08/16 1355 Last data filed at 04/08/16 0900  Gross per 24 hour  Intake              720 ml  Output             1800 ml  Net            -1080 ml    Telemetry reveals sinus rhythm  GEN- The patient is elderly and chronically ill appearing, alert and oriented x 3 today.   Head- normocephalic, atraumatic Eyes-  Sclera clear, conjunctiva pink Ears- hearing intact Oropharynx- clear Neck- supple,   Lungs- decreased BS at bases, normal work of breathing Heart- Regular rate and rhythm  GI- soft, NT, ND, + BS Extremities- no clubbing, cyanosis, or edema Skin- no rash or lesion Psych- euthymic mood, full affect Neuro- strength and sensation are intact  LABS: Basic Metabolic Panel:  Recent Labs  04/07/16 0538 04/08/16 0436  NA 136 137  K 4.0 4.6  CL 98* 98*  CO2 29 30  GLUCOSE 202* 166*  BUN 34* 41*  CREATININE 2.11* 1.96*  CALCIUM  8.5* 8.8*   Liver Function Tests:  Recent Labs  04/05/16 1406  AST 23  ALT 14  ALKPHOS 97  BILITOT 0.9  PROT 7.9  ALBUMIN 4.0    Recent Labs  04/05/16 1406  LIPASE 11   CBC:  Recent Labs  04/05/16 1406 04/06/16 0219  WBC 10.8* 6.8  NEUTROABS 8.8* 4.9  HGB 12.8 10.6*  HCT 39.6 32.6*  MCV 85.3 85.3  PLT 205 179   Cardiac Enzymes:  Recent Labs  04/05/16 2201 04/06/16 0219 04/06/16 0756  TROPONINI 0.03* 0.04* 0.04*    ASSESSMENT AND PLAN:   1. Acute on chronic diastolic dysfunction/ pulmonary hypertension Improving with diuresis, though diuresis has been limited by renal failure 2 gram sodium restriction Would discharge on lasix 40mg  daily with follow up in the CHF clinic.  Further management can be directed by advanced CHF team once she has been optimized in the outpatient setting.  I do not think that [place] is required this admit  2. Acute on chronic renal failure  stable Restart lisinopril once renal failure is stable  3. Hypertensive cardiovascular disease with CHF Aggressive medical therapy is advised  Clinically improved Ok to discharge from CV standpoint with close follow-up in the CHF clinic  Very complicated patient.  She is at risk for decompensation.  A high level of decision making was required for this encounter.  Thompson Grayer, MD 04/08/2016 1:55 PM

## 2016-04-08 NOTE — Progress Notes (Signed)
Patient with no complaints or concerns during 7pm - 7am shift.Slept during the night. Will continue to monitor.   Cris Gibby, RN

## 2016-04-08 NOTE — Discharge Summary (Signed)
Physician Discharge Summary   Patient ID: Laura Best MRN: [id number] DOB/AGE: 02-25-1940 76 y.o.  Admit date: 04/05/2016 Discharge date: 04/08/2016  Primary Care Physician:  [name], PA-C  Discharge Diagnoses:      Acute respiratory failure with hypoxia . Diastolic CHF, acute on chronic (HCC) . Type 2 diabetes mellitus with renal manifestations ([place]) . GERD (gastroesophageal reflux disease) . Essential hypertension . acute on chronic kidney disease III   CAD   Consults:  Cardiology   Recommendations for Outpatient Follow-up:  1. Hold lisinopril until Creatinine function improves  2. Please repeat CBC/BMET at next visit    DIET: heart healthy     Allergies:   Allergies  Allergen Reactions  . Ace Inhibitors Other (See Comments)    Hyperkalemia  . Actos [Pioglitazone] Swelling  . Aspirin Other (See Comments)    G.I. Upset.   . Metformin And Related Diarrhea     DISCHARGE MEDICATIONS: Current Discharge Medication List    CONTINUE these medications which have CHANGED   Details  furosemide (LASIX) 40 MG tablet Take 1 tablet (40 mg total) by mouth daily. Qty: 90 tablet, Refills: 3      CONTINUE these medications which have NOT CHANGED   Details  acetaminophen (TYLENOL) 500 MG tablet Take 500 mg by mouth every 6 (six) hours as needed for pain.    amLODipine (NORVASC) 10 MG tablet Take 1 tablet (10 mg total) by mouth daily. Qty: 90 tablet, Refills: 3    aspirin EC 81 MG tablet Take 81 mg by mouth daily.    Cholecalciferol (VITAMIN D) 2000 UNITS CAPS Take 1 capsule by mouth daily.    cloNIDine (CATAPRES) 0.2 MG tablet TAKE ONE TABLET BY MOUTH TWICE DAILY Qty: 60 tablet, Refills: 3    gabapentin (NEURONTIN) 100 MG capsule Take 1-2 capsules at bedtime for neuropathy Qty: 60 capsule, Refills: 3    insulin aspart (NOVOLOG) 100 UNIT/ML FlexPen Take 7 units with luch and supper.  Inject 15 minutes prior to meal or immediately after meal. Qty: 15 mL,  Refills: 11    LEVEMIR FLEXTOUCH 100 UNIT/ML Pen INJECT 22 UNITS SUBCUTANEOUSLY ONCE DAILY AT  10  PM Qty: 7 pen, Refills: 2    omeprazole (PRILOSEC) 20 MG capsule TAKE ONE CAPSULE BY MOUTH ONCE DAILY Qty: 90 capsule, Refills: 3    polyethylene glycol (MIRALAX / GLYCOLAX) packet Take 17 g by mouth daily. Qty: 14 each, Refills: 0    psyllium (HYDROCIL/METAMUCIL) 95 % PACK Take 1 packet by mouth 3 (three) times daily. Qty: 56 each, Refills: 0    simvastatin (ZOCOR) 20 MG tablet TAKE ONE TABLET BY MOUTH ONCE DAILY AT BEDTIME Qty: 90 tablet, Refills: 3    Blood Glucose Monitoring Suppl (ONE TOUCH ULTRA SYSTEM KIT) w/Device KIT 1 meter Qty: 1 each, Refills: 0    !! glucose blood test strip 1 each by Other route 4 (four) times daily -  before meals and at bedtime. Use as instructed Qty: 150 each, Refills: 12   Associated Diagnoses: Uncontrolled type 2 diabetes mellitus with complication, with long-term current use of insulin ([place])    !! glucose blood test strip Use as directed. Check blood sugar 3 times daily before meals and at bedtime. Qty: 100 each, Refills: 12    Lancets (ONETOUCH ULTRASOFT) lancets Use as instructed Qty: 100 each, Refills: 12    RELION SHORT PEN NEEDLES 31G X 8 MM MISC USE ONE PEN NEEDLE ONCE DAILY AS DIRECTED Qty: 50 each,  Refills: 11   Associated Diagnoses: Type II diabetes mellitus, uncontrolled ([place])     !! - Potential duplicate medications found. Please discuss with provider.    STOP taking these medications     lisinopril (PRINIVIL,ZESTRIL) 2.5 MG tablet          Brief H and P: For complete details please refer to admission H and P, but in brief Patient is a 76 year old female with HTN, HLD, diastolic CHF, CAD, and SBO; who presents with complaints of shortness of breath and abdominal pain. She complains of pain in the right upper quadrant of her abdomen that is dull and will sometimes moved her back.Patient recently had a small bowel obstruction  in 01/2016,but was resolved with bowel rest and did not require any surgical intervention. Patient reported shortness of breath, easily getting winded with doing the regular tasks at home, nausea, abdominal distention, decreased appetite, intermittent dry nonproductive cough. In ED, patient was seen to be febrile to 100.44F, pulse 92-99, respirations 16-28, blood pressure elevated up to 191/79, and O2 saturations as low as 87% on room air. Chest x-ray showed pulmonary vascular congestion and cardiomegaly. CT scan of the abdomen revealed some small abdominal hernias and no signs of SBO. Patient was given 40 mg of Lasix IV in the ED  Hospital Course:  Acute respiratory failure with hypoxia, Likely due to acute on chronic diastolic congestive heart failure exacerbation:  - Patient not on home O2, O2 sats of 87% on room air on admit.  - 2-D echo showed EF of 55-60% with grade 2 diastolic dysfunction, right ventricle systolic pressure increased consistent with moderate pulmonary hypertension, PA pressure 49  - She was initially placed on aggressive diuresis, negative balance 2.38 liters. However diuresis was limited by worsening renal function.    - cardiology, Dr Rayann Heman recommended discharge on lasix '40mg'$  daily with follow up in the CHF clinic.  Further management can be directed by advanced CHF team once she has been optimized in the outpatient setting. Per cardiology, RHC is not required this admit  Right upper quadrant abdominal pain - Patient initially noted having abdominal distention and appeared to be right upper quadrant discomfort. -  Lipase and liver function studies were within normal limits. -  CT scan of the abdomen and pelvis did not show any acute signs to explain symptoms.   HTN:  - Continue amlodipine and clonidine  Diabetes mellitus type 2:  -  Last hemoglobin A1c on 01/20/2016 was noted to be 7.  - Continue outpatient regimen of Levemir and novolog   Ac on CKD stage III:  stable. Initial creatinine 1.63 which appears new patient baseline 1.7.  -Creatinine trended up due to IV diuresis. lasix was then held - per cardiology resume lasix at '40mg'$  daily   CAD - Continue aspirin  GERD with hiatal hernia -  continue PPI    Day of Discharge BP (!) 126/46 (BP Location: Left Arm)   Pulse (!) 50   Temp 98.4 F (36.9 C) (Oral)   Resp 18   Ht '5\' 3"'$  (1.6 m)   Wt 78.7 kg (173 lb 6.4 oz)   SpO2 96%   BMI 30.72 kg/m   Physical Exam: General: Alert and awake oriented x3 not in any acute distress. HEENT: anicteric sclera, pupils reactive to light and accommodation CVS: S1-S2 clear no murmur rubs or gallops Chest: clear to auscultation bilaterally, no wheezing rales or rhonchi Abdomen: soft nontender, nondistended, normal bowel sounds Extremities: no cyanosis, clubbing or  edema noted bilaterally Neuro: Cranial nerves II-XII intact, no focal neurological deficits   The results of significant diagnostics from this hospitalization (including imaging, microbiology, ancillary and laboratory) are listed below for reference.    LAB RESULTS: Basic Metabolic Panel:  Recent Labs Lab 04/07/16 0538 04/08/16 0436  NA 136 137  K 4.0 4.6  CL 98* 98*  CO2 29 30  GLUCOSE 202* 166*  BUN 34* 41*  CREATININE 2.11* 1.96*  CALCIUM 8.5* 8.8*   Liver Function Tests:  Recent Labs Lab 04/05/16 1406  AST 23  ALT 14  ALKPHOS 97  BILITOT 0.9  PROT 7.9  ALBUMIN 4.0    Recent Labs Lab 04/05/16 1406  LIPASE 11   No results for input(s): AMMONIA in the last 168 hours. CBC:  Recent Labs Lab 04/05/16 1406 04/06/16 0219  WBC 10.8* 6.8  NEUTROABS 8.8* 4.9  HGB 12.8 10.6*  HCT 39.6 32.6*  MCV 85.3 85.3  PLT 205 179   Cardiac Enzymes:  Recent Labs Lab 04/06/16 0219 04/06/16 0756  TROPONINI 0.04* 0.04*   BNP: Invalid input(s): POCBNP CBG:  Recent Labs Lab 04/08/16 0734 04/08/16 1152  GLUCAP 138* 159*    Significant Diagnostic Studies:   Ct Abdomen Pelvis Wo Contrast  Result Date: 04/05/2016 CLINICAL DATA:  Abdominal pain, distention. History of small bowel obstruction. Rule out obstruction. EXAM: CT ABDOMEN AND PELVIS WITHOUT CONTRAST TECHNIQUE: Multidetector CT imaging of the abdomen and pelvis was performed following the standard protocol without IV contrast. COMPARISON:  02/17/2016 FINDINGS: Lower chest: Heart is mildly enlarged. Scarring in the bases. No effusions. Hepatobiliary: No focal hepatic abnormality. Gallbladder unremarkable. Pancreas: No focal abnormality or ductal dilatation. Spleen: No focal abnormality.  Normal size. Adrenals/Urinary Tract: Punctate nonobstructing stone in the midpole of the right kidney. No hydronephrosis. Adrenal glands and urinary bladder are unremarkable. Stomach/Bowel: Moderate stool throughout the colon. Sigmoid diverticulosis. No active diverticulitis. No small bowel distention to suggest small bowel obstruction. Stomach decompressed, unremarkable. Vascular/Lymphatic: Heavily calcified aorta and iliac vessels. No aneurysm. No adenopathy. Reproductive: Uterus and adnexa unremarkable.  No mass. Other: No free fluid or free air. Small umbilical and supraumbilical ventral hernias containing fat. Musculoskeletal: No acute bony abnormality. IMPRESSION: No evidence of bowel obstruction. Small umbilical and ventral hernias containing fat, unchanged. Heavily calcified aorta and iliac vessels. No acute findings in the abdomen or pelvis. Electronically Signed   By: Rolm Baptise M.D.   On: 04/05/2016 17:43   Dg Chest 2 View  Result Date: 04/05/2016 CLINICAL DATA:  Shortness of breath. EXAM: CHEST  2 VIEW COMPARISON:  02/17/2016 . FINDINGS: Median sternotomy. Cardiomegaly with pulmonary vascular prominence. No focal alveolar infiltrate. Mild basilar atelectasis on the left. No acute bony abnormality. Carotid vascular calcification . Sliding hiatal hernia. IMPRESSION: 1. Prior median sternotomy. Cardiomegaly with  pulmonary vascular congestion. 2.  Mild left base atelectasis. 3. Sliding hiatal hernia. 4. Carotid vascular disease. Electronically Signed   By: Marcello Moores  Register   On: 04/05/2016 14:53    2D ECHO: Study Conclusions  - Left ventricle: The cavity size was normal. There was moderate   concentric hypertrophy. Systolic function was normal. The   estimated ejection fraction was in the range of 55% to 60%. Wall   motion was normal; there were no regional wall motion   abnormalities. Features are consistent with a pseudonormal left   ventricular filling pattern, with concomitant abnormal relaxation   and increased filling pressure (grade 2 diastolic dysfunction). - Mitral valve: Calcified annulus. There was  trivial regurgitation. - Left atrium: The atrium was moderately dilated. - Right ventricle: The cavity size was moderately dilated. Wall   thickness was normal. Systolic function was mildly to moderately   reduced. - Pulmonary arteries: PA peak pressure: 49 mm Hg (S).  Impressions:  - The right ventricular systolic pressure was increased consistent   with moderate pulmonary hypertension.   Disposition and Follow-up: Discharge Instructions    (HEART FAILURE PATIENTS) Call MD:  Anytime you have any of the following symptoms: 1) 3 pound weight gain in 24 hours or 5 pounds in 1 week 2) shortness of breath, with or without a dry hacking cough 3) swelling in the hands, feet or stomach 4) if you have to sleep on extra pillows at night in order to breathe.    Complete by:  As directed    Diet - low sodium heart healthy    Complete by:  As directed    Increase activity slowly    Complete by:  As directed        DISPOSITION: home    [place], PA-C. Schedule an appointment as soon as possible for a visit in 2 week(s).   Specialty:  Physician Assistant Contact information: [place]  [address] [phone]        Jory Sims, NP. Schedule an appointment as soon as possible for a visit in 9 day(s).   Specialties:  Nurse Practitioner, Radiology, Cardiology Contact information: [place] [address] [phone]            Time spent on Discharge: 26mns   Signed:   Shanyah Gattuso M.D. [facility] 04/08/2016, 2:38 PM Pager: [phone]

## 2016-04-08 NOTE — Progress Notes (Signed)
Pt has orders to be discharged. Discharge instructions given and pt has no additional questions at this time. Medication regimen reviewed and pt educated. Pt verbalized understanding and has no additional questions. Telemetry box removed. IV removed and site in good condition. Pt stable and waiting for transportation.   Michaele Amundson RN 

## 2016-04-14 ENCOUNTER — Ambulatory Visit: Payer: Medicare HMO | Admitting: Family Medicine

## 2016-04-14 ENCOUNTER — Emergency Department (HOSPITAL_COMMUNITY)
Admission: EM | Admit: 2016-04-14 | Discharge: 2016-04-14 | Disposition: A | Discharge status: Home / Self Care | Payer: Medicare HMO | Attending: Emergency Medicine | Admitting: Emergency Medicine

## 2016-04-14 ENCOUNTER — Encounter (HOSPITAL_COMMUNITY): Payer: Self-pay | Admitting: Nurse Practitioner

## 2016-04-14 DIAGNOSIS — I130 Hypertensive heart and chronic kidney disease with heart failure and stage 1 through stage 4 chronic kidney disease, or unspecified chronic kidney disease: Secondary | ICD-10-CM | POA: Diagnosis not present

## 2016-04-14 DIAGNOSIS — Z7982 Long term (current) use of aspirin: Secondary | ICD-10-CM | POA: Insufficient documentation

## 2016-04-14 DIAGNOSIS — Z87891 Personal history of nicotine dependence: Secondary | ICD-10-CM | POA: Diagnosis not present

## 2016-04-14 DIAGNOSIS — I5030 Unspecified diastolic (congestive) heart failure: Secondary | ICD-10-CM | POA: Diagnosis not present

## 2016-04-14 DIAGNOSIS — E1122 Type 2 diabetes mellitus with diabetic chronic kidney disease: Secondary | ICD-10-CM | POA: Insufficient documentation

## 2016-04-14 DIAGNOSIS — Z79899 Other long term (current) drug therapy: Secondary | ICD-10-CM | POA: Diagnosis not present

## 2016-04-14 DIAGNOSIS — J449 Chronic obstructive pulmonary disease, unspecified: Secondary | ICD-10-CM | POA: Diagnosis not present

## 2016-04-14 DIAGNOSIS — R21 Rash and other nonspecific skin eruption: Secondary | ICD-10-CM | POA: Diagnosis present

## 2016-04-14 DIAGNOSIS — B029 Zoster without complications: Secondary | ICD-10-CM | POA: Diagnosis not present

## 2016-04-14 DIAGNOSIS — N183 Chronic kidney disease, stage 3 (moderate): Secondary | ICD-10-CM | POA: Diagnosis not present

## 2016-04-14 DIAGNOSIS — Z794 Long term (current) use of insulin: Secondary | ICD-10-CM | POA: Diagnosis not present

## 2016-04-14 LAB — CBG MONITORING, ED: GLUCOSE-CAPILLARY: 217 mg/dL — AB (ref 65–99)

## 2016-04-14 MED ORDER — PREDNISONE 20 MG PO TABS: 60.0000 mg | ORAL_TABLET | Freq: Once | ORAL | Status: DC

## 2016-04-14 MED ORDER — VALACYCLOVIR HCL 500 MG PO TABS
1000.0000 mg | ORAL_TABLET | Freq: Every day | ORAL | Status: DC
  Administered 2016-04-14: 1000 mg via ORAL
  Filled 2016-04-14: qty 2

## 2016-04-14 MED ORDER — VALACYCLOVIR HCL 1 G PO TABS: 1000.0000 mg | ORAL_TABLET | Freq: Three times a day (TID) | ORAL | 0 refills | Status: DC

## 2016-04-14 MED ORDER — VALACYCLOVIR HCL 500 MG PO TABS
1000.0000 mg | ORAL_TABLET | Freq: Every day | ORAL | Status: DC
  Filled 2016-04-14: qty 2

## 2016-04-14 MED ORDER — HYDROCODONE-ACETAMINOPHEN 5-325 MG PO TABS: 1.0000 | ORAL_TABLET | Freq: Four times a day (QID) | ORAL | 0 refills | Status: DC | PRN

## 2016-04-14 NOTE — ED Provider Notes (Signed)
[facility] DEPT Provider Note   CSN: [id number] Arrival date & time: 04/14/16  1119     History   Chief Complaint Chief Complaint  Patient presents with  . Skin Problem    HPI Laura Best is a 76 y.o. female who presents with a painful rash. She states she developed a rash last night on the right lower back. It is painful, burning, and radiates to the top of the right thigh. She has not had a shingles shot. She denies fever or drainage. She was recently hospitalized for CHF exacerbation. She lives with her son who has not had a similar rash.   HPI  Past Medical History:  Diagnosis Date  . Arthritis    "hands, feet" (02/17/2016)  . CHF (congestive heart failure) ([place])   . CKD (chronic kidney disease) stage 3, GFR 30-59 ml/min 12/25/2012  . COPD (chronic obstructive pulmonary disease) ([place])   . Diastolic heart failure ([place])   . Hyperlipidemia   . Hypertension   . Osteoporosis   . Osteoporosis   . Pneumonia    "several times" (02/17/2016)  . Small bowel obstruction 02/17/2016  . Tricuspid regurgitation    Echo 05/02/06-Nml LV. Mild MR. Mod TR  . Type II diabetes mellitus ([place])   . Vitamin D deficiency     Patient Active Problem List   Diagnosis Date Noted  . PAH (pulmonary artery hypertension)   . History of atrial septal defect repair   . Leukocytosis 04/06/2016  . Diastolic CHF, acute on chronic (HCC) 04/05/2016  . SBO (small bowel obstruction) 02/17/2016  . Chronic diastolic congestive heart failure ([place]) 02/17/2016  . Diabetes mellitus with complication ([place]) 83/41/9622  . Pyuria 02/17/2016  . GERD (gastroesophageal reflux disease) 04/05/2015  . Diastolic dysfunction 29/79/8921  . Acute diverticulitis 05/03/2014  . Acute appendicitis with peritoneal abscess 09/27/2013  . Bradycardia 04/02/2013  . Hyperkalemia 01/22/2013  . Acute renal failure ([place]) 01/21/2013  . Anemia of chronic disease 01/21/2013  . Acute bronchitis 01/20/2013  . CHF exacerbation ([place])  01/19/2013  . CKD (chronic kidney disease) stage 3, GFR 30-59 ml/min 12/25/2012  . Screening for colorectal cancer 12/25/2012  . Breast cancer screening 12/25/2012  . Vitamin D deficiency   . Osteoporosis   . Postop check 08/13/2012  . Appendiceal abscess 07/30/2012  . Fever, unspecified 07/19/2012  . Abdominal pain, other specified site 07/19/2012  . SOB (shortness of breath) 07/19/2012  . ARF (acute renal failure) ([place]) 07/19/2012  . Abscess, appendix 07/19/2012  . Acute diastolic heart failure ([place]) 02/04/2011  . Type 2 diabetes mellitus with renal manifestations ([place]) 02/02/2011  . Pulmonary hypertension 02/02/2011  . COPD exacerbation ([place]) 02/02/2011  . Hypokalemia 02/02/2011  . Normocytic anemia 02/02/2011  . Essential hypertension   . Hyperlipidemia     Past Surgical History:  Procedure Laterality Date  . APPENDECTOMY    . HEART CHAMBER REVISION     patched hole --[place]  . LAPAROSCOPIC APPENDECTOMY N/A 09/27/2013   Procedure: APPENDECTOMY - CONVERTED FROM LAPAROSCOPIC;  Surgeon: Donnie [place]a, MD;  Location: [facility];  Service: General;  Laterality: N/A;  . TUBAL LIGATION      OB History    No data available       Home Medications    Prior to Admission medications   Medication Sig Start Date End Date Taking? Authorizing Provider  acetaminophen (TYLENOL) 500 MG tablet Take 500 mg by mouth every 6 (six) hours as needed for pain.  Historical Provider, MD  amLODipine (NORVASC) 10 MG tablet Take 1 tablet (10 mg total) by mouth daily. 07/12/15   Lendon Colonel, NP  aspirin EC 81 MG tablet Take 81 mg by mouth daily.    Historical Provider, MD  Blood Glucose Monitoring Suppl (ONE TOUCH ULTRA SYSTEM KIT) w/Device KIT 1 meter 02/11/16   Orlena Sheldon, PA-C  Cholecalciferol (VITAMIN D) 2000 UNITS CAPS Take 1 capsule by mouth daily.    Historical Provider, MD  cloNIDine (CATAPRES) 0.2 MG tablet TAKE ONE TABLET BY MOUTH TWICE DAILY 11/10/15   Lendon Colonel, NP    furosemide (LASIX) 40 MG tablet Take 1 tablet (40 mg total) by mouth daily. 04/08/16   Ripudeep Krystal Eaton, MD  gabapentin (NEURONTIN) 100 MG capsule Take 1-2 capsules at bedtime for neuropathy Patient taking differently: Take 100-200 mg by mouth at bedtime.  08/27/15   Alycia Rossetti, MD  glucose blood test strip 1 each by Other route 4 (four) times daily -  before meals and at bedtime. Use as instructed 10/18/15   Orlena Sheldon, PA-C  glucose blood test strip Use as directed. Check blood sugar 3 times daily before meals and at bedtime. 02/11/16   Lonie Peak Dixon, PA-C  insulin aspart (NOVOLOG) 100 UNIT/ML FlexPen Take 7 units with luch and supper.  Inject 15 minutes prior to meal or immediately after meal. 01/21/16   Orlena Sheldon, PA-C  Lancets [facility] ULTRASOFT) lancets Use as instructed 02/11/16   Orlena Sheldon, PA-C  LEVEMIR FLEXTOUCH 100 UNIT/ML Pen INJECT 22 UNITS SUBCUTANEOUSLY ONCE DAILY AT  10  PM 11/10/15   Orlena Sheldon, PA-C  omeprazole (PRILOSEC) 20 MG capsule TAKE ONE CAPSULE BY MOUTH ONCE DAILY 08/16/15   Orlena Sheldon, PA-C  polyethylene glycol (MIRALAX / GLYCOLAX) packet Take 17 g by mouth daily. 02/20/16   Lavina Hamman, MD  psyllium (HYDROCIL/METAMUCIL) 95 % PACK Take 1 packet by mouth 3 (three) times daily. 02/19/16   Lavina Hamman, MD  RELION SHORT PEN NEEDLES 31G X 8 MM MISC USE ONE PEN NEEDLE ONCE DAILY AS DIRECTED 03/10/14   Orlena Sheldon, PA-C  simvastatin (ZOCOR) 20 MG tablet TAKE ONE TABLET BY MOUTH ONCE DAILY AT BEDTIME 05/12/15   Orlena Sheldon, PA-C    Family History Family History  Problem Relation Age of Onset  . Cancer Father   . Diabetes Father   . Cancer Brother     Brain  . Cancer Sister     abdominal fat    Social History Social History  Substance Use Topics  . Smoking status: Former Smoker    Packs/day: 0.50    Years: 50.00    Types: Cigarettes    Quit date: 01/19/2009  . Smokeless tobacco: Never Used  . Alcohol use No     Allergies   Ace inhibitors; Actos  [pioglitazone]; Aspirin; and Metformin and related   Review of Systems Review of Systems  Constitutional: Negative for fever.  Skin: Positive for rash. Negative for wound.  Neurological: Negative for numbness.  All other systems reviewed and are negative.    Physical Exam Updated Vital Signs BP (!) 185/71 (BP Location: Left Arm)   Pulse 92   Temp 97.9 F (36.6 C) (Oral)   Resp 19   Ht _0  (1.6 m)   Wt 77.1 kg   SpO2 99%   BMI 30.11 kg/m   Physical Exam  Constitutional: She is oriented to person,  place, and time. She appears well-developed and well-nourished. No distress.  HENT:  Head: Normocephalic and atraumatic.  Eyes: Conjunctivae are normal. Pupils are equal, round, and reactive to light. Right eye exhibits no discharge. Left eye exhibits no discharge. No scleral icterus.  Neck: Normal range of motion.  Cardiovascular: Normal rate.   Pulmonary/Chest: Effort normal. No respiratory distress.  Abdominal: She exhibits no distension.  Neurological: She is alert and oriented to person, place, and time.  Skin: Skin is warm and dry. Rash (Erythematous papular rash on right upper buttock consistent with early shingles) noted.  Psychiatric: She has a normal mood and affect. Her behavior is normal.  Nursing note and vitals reviewed.    ED Treatments / Results  Labs (all labs ordered are listed, but only abnormal results are displayed) Labs Reviewed  CBG MONITORING, ED - Abnormal; Notable for the following:       Result Value   Glucose-Capillary 217 (*)    All other components within normal limits  CBG MONITORING, ED    EKG  EKG Interpretation None       Radiology No results found.  Procedures Procedures (including critical care time)  Medications Ordered in ED Medications  valACYclovir (VALTREX) tablet 1,000 mg (1,000 mg Oral Given 04/14/16 1323)     Initial Impression / Assessment and Plan / ED Course  I have reviewed the triage vital signs and the  nursing notes.  Pertinent labs & imaging results that were available during my care of the patient were reviewed by me and considered in my medical decision making (see chart for details).  76 year old female with shingles - likely due to stress from recent hospitalization. She is hypertensive. Otherwise vitals are normal. Since her glucose is uncontrolled, will avoid steroids. Gave dose of Valtrex in ED. Advised Valtrex TID for a week and pain medicine as needed. Follow up with PCP next week. Shared visit with Dr. Tyrone Nine. She verbalized understanding. Return precautions given.  Final Clinical Impressions(s) / ED Diagnoses   Final diagnoses:  Herpes zoster without complication    New Prescriptions New Prescriptions   No medications on file     Recardo Evangelist, PA-C 04/14/16 [place], DO 04/14/16 1606

## 2016-04-14 NOTE — ED Notes (Signed)
Pt requesting CBG

## 2016-04-14 NOTE — ED Triage Notes (Addendum)
Pt presents with c/o skin problem. She began to have pain to her R buttocks last night and noticed several red spots to the area. The pain radiates down her R leg. She reports malaise, nausea. She has taken tylenol with some relief. She is concerned that something may have bitten her.

## 2016-04-14 NOTE — Discharge Instructions (Signed)
Follow up with your doctor next week Take Valtrex three times a day Take pain medicine as needed

## 2016-04-19 ENCOUNTER — Encounter: Payer: Self-pay | Admitting: Physician Assistant

## 2016-04-19 ENCOUNTER — Ambulatory Visit: Payer: Medicare HMO | Admitting: Physician Assistant

## 2016-04-19 ENCOUNTER — Ambulatory Visit (INDEPENDENT_AMBULATORY_CARE_PROVIDER_SITE_OTHER): Payer: Medicare HMO | Admitting: Physician Assistant

## 2016-04-19 VITALS — BP 148/68 | HR 63 | Temp 98.3°F | Resp 14 | Wt 174.2 lb

## 2016-04-19 DIAGNOSIS — I5031 Acute diastolic (congestive) heart failure: Secondary | ICD-10-CM | POA: Diagnosis not present

## 2016-04-19 DIAGNOSIS — K56609 Unspecified intestinal obstruction, unspecified as to partial versus complete obstruction: Secondary | ICD-10-CM | POA: Diagnosis not present

## 2016-04-19 DIAGNOSIS — Z09 Encounter for follow-up examination after completed treatment for conditions other than malignant neoplasm: Secondary | ICD-10-CM | POA: Diagnosis not present

## 2016-04-19 DIAGNOSIS — B029 Zoster without complications: Secondary | ICD-10-CM | POA: Diagnosis not present

## 2016-04-19 MED ORDER — GABAPENTIN 300 MG PO CAPS: ORAL_CAPSULE | ORAL | 3 refills | Status: DC

## 2016-04-19 NOTE — Progress Notes (Signed)
Patient ID: HAIZLEE HENTON MRN: [id number], DOB: [birth year], 76 y.o. Date of Encounter: '@DATE'$ @  Chief Complaint:  Chief Complaint  Patient presents with  . hospital f/u with shingles    HPI: 76 y.o. year old female  presents for OV to f/u Hospitalization.   She has actually had 2 recent hospitalizations and one recent ER visit. Today I have reviewed all of these.  She had hospitalization 02/16/16. Was diagnosed with small bowel obstruction. Reviewed that hospital discharge summary and there is nothing specific that I need to follow-up from that hospitalization. She has had no recurrent abdominal symptoms. She has had f/u labs at her more recent hospital visits.  She then had an hospitalization 04/05/16 through 04/08/16. She had acute on chronic diastolic heart failure and acute on chronic kidney disease. Cardiology was consulted. I reviewed the discharge summary--the section with "  recommendations for outpatient follow-up"--- they have to "hold lisinopril until creatinine function improves. Please repeat CBC/bmet at next visit".  Pt reports that she has office visit scheduled with the heart failure clinic this upcoming Monday. I will wait and let them follow-up these labs and make decisions regarding her lisinopril.  There is nothing from that hospitalization that I need to follow-up today.  She reports that her breathing is stable and she is having no increased shortness of breath and no increased LE edema.  She had ER visit 04/14/16 and was diagnosed with shingles. They prescribed Valtrex 3 times a day 7 days. They also prescribed hydrocodone 5 mg #10. Today she reports that she is still experiencing burning pain and points to her right low back and says that is going all the way down her right leg. Points to area of rash on her right buttock. States that this is the only area of rash. She states that she still has some pain pills remaining. Says that they told her to only use those for  severe pain. I asked if she was using any Tylenol or anything else for pain and she says  "no honey,  they told me this has Tylenol and codeine all in it and not to take any additional medicine with this."   Past Medical History:  Diagnosis Date  . Arthritis    "hands, feet" (02/17/2016)  . CHF (congestive heart failure) ([place])   . CKD (chronic kidney disease) stage 3, GFR 30-59 ml/min 12/25/2012  . COPD (chronic obstructive pulmonary disease) ([place])   . Diastolic heart failure ([place])   . Hyperlipidemia   . Hypertension   . Osteoporosis   . Osteoporosis   . Pneumonia    "several times" (02/17/2016)  . Small bowel obstruction 02/17/2016  . Tricuspid regurgitation    Echo 05/02/06-Nml LV. Mild MR. Mod TR  . Type II diabetes mellitus ([place])   . Vitamin D deficiency      Home Meds: Outpatient Medications Prior to Visit  Medication Sig Dispense Refill  . acetaminophen (TYLENOL) 500 MG tablet Take 500 mg by mouth every 6 (six) hours as needed for pain.    Marland Kitchen amLODipine (NORVASC) 10 MG tablet Take 1 tablet (10 mg total) by mouth daily. 90 tablet 3  . aspirin EC 81 MG tablet Take 81 mg by mouth daily.    . Blood Glucose Monitoring Suppl (ONE TOUCH ULTRA SYSTEM KIT) w/Device KIT 1 meter 1 each 0  . Cholecalciferol (VITAMIN D) 2000 UNITS CAPS Take 1 capsule by mouth daily.    . cloNIDine (CATAPRES) 0.2 MG tablet  TAKE ONE TABLET BY MOUTH TWICE DAILY 60 tablet 3  . furosemide (LASIX) 40 MG tablet Take 1 tablet (40 mg total) by mouth daily. 90 tablet 3  . glucose blood test strip 1 each by Other route 4 (four) times daily -  before meals and at bedtime. Use as instructed 150 each 12  . glucose blood test strip Use as directed. Check blood sugar 3 times daily before meals and at bedtime. 100 each 12  . HYDROcodone-acetaminophen (NORCO/VICODIN) 5-325 MG tablet Take 1 tablet by mouth every 6 (six) hours as needed for severe pain. 10 tablet 0  . insulin aspart (NOVOLOG) 100 UNIT/ML FlexPen Take 7 units  with luch and supper.  Inject 15 minutes prior to meal or immediately after meal. 15 mL 11  . Lancets (ONETOUCH ULTRASOFT) lancets Use as instructed 100 each 12  . LEVEMIR FLEXTOUCH 100 UNIT/ML Pen INJECT 22 UNITS SUBCUTANEOUSLY ONCE DAILY AT  10  PM 7 pen 2  . omeprazole (PRILOSEC) 20 MG capsule TAKE ONE CAPSULE BY MOUTH ONCE DAILY 90 capsule 3  . polyethylene glycol (MIRALAX / GLYCOLAX) packet Take 17 g by mouth daily. 14 each 0  . psyllium (HYDROCIL/METAMUCIL) 95 % PACK Take 1 packet by mouth 3 (three) times daily. 56 each 0  . RELION SHORT PEN NEEDLES 31G X 8 MM MISC USE ONE PEN NEEDLE ONCE DAILY AS DIRECTED 50 each 11  . simvastatin (ZOCOR) 20 MG tablet TAKE ONE TABLET BY MOUTH ONCE DAILY AT BEDTIME 90 tablet 3  . valACYclovir (VALTREX) 1000 MG tablet Take 1 tablet (1,000 mg total) by mouth 3 (three) times daily. 21 tablet 0  . gabapentin (NEURONTIN) 100 MG capsule Take 1-2 capsules at bedtime for neuropathy (Patient taking differently: Take 100-200 mg by mouth at bedtime. ) 60 capsule 3   No facility-administered medications prior to visit.     Allergies:  Allergies  Allergen Reactions  . Ace Inhibitors Other (See Comments)    Hyperkalemia  . Actos [Pioglitazone] Swelling  . Aspirin Other (See Comments)    G.I. Upset.   . Metformin And Related Diarrhea    Social History   Social History  . Marital status: Widowed    Spouse name: N/A  . Number of children: N/A  . Years of education: N/A   Occupational History  . Not on file.   Social History Main Topics  . Smoking status: Former Smoker    Packs/day: 0.50    Years: 50.00    Types: Cigarettes    Quit date: 01/19/2009  . Smokeless tobacco: Never Used  . Alcohol use No  . Drug use: No  . Sexual activity: No   Other Topics Concern  . Not on file   Social History Narrative   Entered 10/2013:   She has never driven.   She lives with her son.    Family History  Problem Relation Age of Onset  . Cancer Father   .  Diabetes Father   . Cancer Brother     Brain  . Cancer Sister     abdominal fat     Review of Systems:  See HPI for pertinent ROS. All other ROS negative.    Physical Exam: Blood pressure (!) 148/68, pulse 63, temperature 98.3 F (36.8 C), temperature source Oral, resp. rate 14, weight 174 lb 3.2 oz (79 kg), SpO2 98 %., Body mass index is 30.86 kg/m. General:WNWD WF. Appears in no acute distress. Neck: Supple. No thyromegaly. No lymphadenopathy. Lungs:  Clear bilaterally to auscultation without wheezes, rales, or rhonchi. Breathing is unlabored. Heart: RRR with S1 S2. No murmurs, rubs, or gallops. Abdomen: Soft, non-tender, non-distended with normoactive bowel sounds. No hepatomegaly. No rebound/guarding. No obvious abdominal masses. Musculoskeletal:  Strength and tone normal for age. Extremities/Skin: Warm and dry.  No LE edema.  Right Buttock: ~ 1.5 inch diameter area / cluster of pink papules Neuro: Alert and oriented X 3. Moves all extremities spontaneously. Gait is normal. CNII-XII grossly in tact. Psych:  Responds to questions appropriately with a normal affect.     ASSESSMENT AND PLAN:  76 y.o. year old female with  1. Hospital discharge follow-up See HPI---I have reviewed Discharge summaries of 2 hospitalizations and have reviewed ED note.  2. Herpes zoster without complication Regarding her burning pain--PostHerpetic Neuralgia----currently she is on Neurontin 100 mg 1-2 daily at bedtime for peripheral neuropathy secondary to her diabetes. Today I have removed this from her medicine list so that it will not get confused and have told her to stop this.  While she is here during the visit-- she has gotten this one medicine bottle out of her bag of medicines and knows that this is the bottle for her to put to the side and not take right now. She is going to take the following gabapentin dose. Told her that once she gets up to taking one 3 times daily, that if her burning pain  is not controlled with this-- to call me and I can further adjust the dose to control her symptoms. Will have her schedule follow-up visit for 3 weeks and we have noted on that schedule for that OV to "follow-up shingles " and at that visit if her burning pain is resolved will decrease back to her prior dose of gabapentin 100 mg 1-2 daily at bedtime for neuropathy.  - gabapentin (NEURONTIN) 300 MG capsule; Day 1: Take 1 at night.  Day 2: Take 1 twice a day Day 3: take one 3 times a day Cont. Taking one 3 times a day  Dispense: 90 capsule; Refill: 3  3. Acute diastolic heart failure ([place]) Reviewed that hospital discharge summary. She has follow-up appointment at the heart failure clinic Monday and they will follow-up things from that hospitalization.  4. SBO (small bowel obstruction) Reviewed that hospital discharge summary. Those symptoms have resolved.   [address] [place], [place], [facility] 04/19/2016 12:33 PM

## 2016-04-22 DIAGNOSIS — R69 Illness, unspecified: Secondary | ICD-10-CM | POA: Diagnosis not present

## 2016-04-23 DIAGNOSIS — R69 Illness, unspecified: Secondary | ICD-10-CM | POA: Diagnosis not present

## 2016-04-23 NOTE — Progress Notes (Signed)
Cardiology Office Note   Date:  04/24/2016   ID:  Laura Best, DOB [birth year], MRN [id number]  PCP:  [name], PA-C  Cardiologist: [name], NP   Chief Complaint  Patient presents with  . Congestive Heart Failure  . Hypertension      History of Present Illness: Laura Best is a 76 y.o. female who presents for post hospital follow up after admission for CHF. Known history of CHF, CKD Stage 3, Type II diabetes, Hyperlipidemia, and ASD repair in 1993. She was seen on consultation.   She was found to be in respiratory distress when admitted. She was also found to be hypertensive and in heart failure.  She was diuresed 2.3 liters, and was discharged on lasix 40 mg daily. She was not started on ACE due to CKD.   Wt on discharge 173 lbs.   Echocardiogram 04/06/2016 Left ventricle: The cavity size was normal. There was moderate   concentric hypertrophy. Systolic function was normal. The   estimated ejection fraction was in the range of 55% to 60%. Wall   motion was normal; there were no regional wall motion   abnormalities. Features are consistent with a pseudonormal left   ventricular filling pattern, with concomitant abnormal relaxation   and increased filling pressure (grade 2 diastolic dysfunction). - Mitral valve: Calcified annulus. There was trivial regurgitation. - Left atrium: The atrium was moderately dilated. - Right ventricle: The cavity size was moderately dilated. Wall   thickness was normal. Systolic function was mildly to moderately   reduced. - Pulmonary arteries: PA peak pressure: 49 mm Hg (S). Impressions: - The right ventricular systolic pressure was increased consistent   with moderate pulmonary hypertension.  Since being seen last, she was diagnosed with shingles. Has been placed on gabapentin. She states the symptoms have improved. She has weighed herself daily and has avoided salty foods. She has maintained her weight since discharge. She  has no new symptoms.   Past Medical History:  Diagnosis Date  . Arthritis    "hands, feet" (02/17/2016)  . CHF (congestive heart failure) ([place])   . CKD (chronic kidney disease) stage 3, GFR 30-59 ml/min 12/25/2012  . COPD (chronic obstructive pulmonary disease) ([place])   . Diastolic heart failure ([place])   . Hyperlipidemia   . Hypertension   . Osteoporosis   . Osteoporosis   . Pneumonia    "several times" (02/17/2016)  . Small bowel obstruction 02/17/2016  . Tricuspid regurgitation    Echo 05/02/06-Nml LV. Mild MR. Mod TR  . Type II diabetes mellitus ([place])   . Vitamin D deficiency     Past Surgical History:  Procedure Laterality Date  . APPENDECTOMY    . HEART CHAMBER REVISION     patched hole --[place]  . LAPAROSCOPIC APPENDECTOMY N/A 09/27/2013   Procedure: APPENDECTOMY - CONVERTED FROM LAPAROSCOPIC;  Surgeon: Donnie Mesa, MD;  Location: [facility];  Service: General;  Laterality: N/A;  . TUBAL LIGATION       Current Outpatient Prescriptions  Medication Sig Dispense Refill  . acetaminophen (TYLENOL) 500 MG tablet Take 500 mg by mouth every 6 (six) hours as needed for pain.    Marland Kitchen amLODipine (NORVASC) 10 MG tablet Take 1 tablet (10 mg total) by mouth daily. 90 tablet 3  . aspirin EC 81 MG tablet Take 81 mg by mouth daily.    . Blood Glucose Monitoring Suppl (ONE TOUCH ULTRA SYSTEM KIT) w/Device KIT 1 meter 1 each 0  .  Cholecalciferol (VITAMIN D) 2000 UNITS CAPS Take 1 capsule by mouth daily.    . cloNIDine (CATAPRES) 0.2 MG tablet TAKE ONE TABLET BY MOUTH TWICE DAILY 60 tablet 3  . furosemide (LASIX) 40 MG tablet Take 1 tablet (40 mg total) by mouth daily. 90 tablet 3  . gabapentin (NEURONTIN) 300 MG capsule Day 1: Take 1 at night.  Day 2: Take 1 twice a day Day 3: take one 3 times a day Cont. Taking one 3 times a day 90 capsule 3  . glucose blood test strip 1 each by Other route 4 (four) times daily -  before meals and at bedtime. Use as instructed 150 each 12  . glucose blood  test strip Use as directed. Check blood sugar 3 times daily before meals and at bedtime. 100 each 12  . HYDROcodone-acetaminophen (NORCO/VICODIN) 5-325 MG tablet Take 1 tablet by mouth every 6 (six) hours as needed for severe pain. 10 tablet 0  . insulin aspart (NOVOLOG) 100 UNIT/ML FlexPen Take 7 units with luch and supper.  Inject 15 minutes prior to meal or immediately after meal. 15 mL 11  . Lancets (ONETOUCH ULTRASOFT) lancets Use as instructed 100 each 12  . LEVEMIR FLEXTOUCH 100 UNIT/ML Pen INJECT 22 UNITS SUBCUTANEOUSLY ONCE DAILY AT  10  PM 7 pen 2  . omeprazole (PRILOSEC) 20 MG capsule TAKE ONE CAPSULE BY MOUTH ONCE DAILY 90 capsule 3  . polyethylene glycol (MIRALAX / GLYCOLAX) packet Take 17 g by mouth daily. 14 each 0  . psyllium (HYDROCIL/METAMUCIL) 95 % PACK Take 1 packet by mouth 3 (three) times daily. 56 each 0  . RELION SHORT PEN NEEDLES 31G X 8 MM MISC USE ONE PEN NEEDLE ONCE DAILY AS DIRECTED 50 each 11  . simvastatin (ZOCOR) 20 MG tablet TAKE ONE TABLET BY MOUTH ONCE DAILY AT BEDTIME 90 tablet 3  . valACYclovir (VALTREX) 1000 MG tablet Take 1 tablet (1,000 mg total) by mouth 3 (three) times daily. 21 tablet 0   No current facility-administered medications for this visit.     Allergies:   Ace inhibitors; Actos [pioglitazone]; Aspirin; and Metformin and related    Social History:  The patient  reports that she quit smoking about 7 years ago. Her smoking use included Cigarettes. She has a 25.00 pack-year smoking history. She has never used smokeless tobacco. She reports that she does not drink alcohol or use drugs.   Family History:  The patient's family history includes Cancer in her brother, father, and sister; Diabetes in her father.    ROS: All other systems are reviewed and negative. Unless otherwise mentioned in H&P    PHYSICAL EXAM: VS:  There were no vitals taken for this visit. , BMI There is no height or weight on file to calculate BMI. GEN: Well nourished, well  developed, in no acute distress  HEENT: normal  Neck: no JVD, carotid bruits, or masses Cardiac: QPY;`1/9 systolic murmur,  rubs, or gallops,no edema  Respiratory:  clear to auscultation bilaterally, normal work of breathing GI: soft, nontender, nondistended, + BS MS: no deformity or atrophy Lesions in small area on posterior right hip. 2 cm.  Skin: warm and dry, no rash Neuro:  Strength and sensation are intact Psych: euthymic mood, full affect Recent Labs: 04/05/2016: ALT 14; B Natriuretic Peptide 423.1 04/06/2016: Hemoglobin 10.6; Platelets 179 04/08/2016: BUN 41; Creatinine, Ser 1.96; Potassium 4.6; Sodium 137    Lipid Panel    Component Value Date/Time   CHOL 159  01/20/2016 1202   TRIG 132 01/20/2016 1202   HDL 61 01/20/2016 1202   CHOLHDL 2.6 01/20/2016 1202   VLDL 26 01/20/2016 1202   LDLCALC 72 01/20/2016 1202      Wt Readings from Last 3 Encounters:  04/19/16 174 lb 3.2 oz (79 kg)  04/14/16 170 lb (77.1 kg)  04/08/16 173 lb 6.4 oz (78.7 kg)     ASSESSMENT AND PLAN:  1.  Chronic diastolic CHF:    She is weighing daily and avoiding salt. She has maintained her weight and has no evidence of decompensation at this time. Will continue current medication regimen without changes. Continue lasix 40 mg daily.    2. Hypertension: She currently well controlled. Not a candidate for ARB or ACE due to CKD. Last creatinine 1.96.   3. Shingles: Improved symptoms since initial diagnosis. She is not taking gabapentin as directed, and has been instructed to take TID instead of 3 tablets all at once at HS.      Current medicines are reviewed at length with the patient today.    Labs/ tests ordered today include:  No orders of the defined types were placed in this encounter.    Disposition:   FU with Dr. Harl Bowie on previously scheduled appointment in June 2018.  Signed, Jory Sims, NP  04/24/2016 3:33 PM    [facility] [address]  [place], [place] [address] Phone: [phone]; Fax: [phone]

## 2016-04-24 ENCOUNTER — Ambulatory Visit (INDEPENDENT_AMBULATORY_CARE_PROVIDER_SITE_OTHER): Payer: Medicare HMO | Admitting: Adult Health

## 2016-04-24 ENCOUNTER — Encounter: Payer: Self-pay | Admitting: Adult Health

## 2016-04-24 VITALS — BP 124/50 | HR 54 | Ht 63.0 in | Wt 175.0 lb

## 2016-04-24 DIAGNOSIS — I10 Essential (primary) hypertension: Secondary | ICD-10-CM

## 2016-04-24 DIAGNOSIS — I5043 Acute on chronic combined systolic (congestive) and diastolic (congestive) heart failure: Secondary | ICD-10-CM

## 2016-04-24 NOTE — Patient Instructions (Signed)
Your physician recommends that you schedule a follow-up appointment with Dr. Harl Bowie  Your physician recommends that you continue on your current medications as directed. Please refer to the Current Medication list given to you today.  If you need a refill on your cardiac medications before your next appointment, please call your pharmacy.  Thank you for choosing [facility]!

## 2016-04-24 NOTE — Progress Notes (Signed)
Name: Laura Best    DOB: 03-18-1940  Age: 76 y.o.  MR#: [id number]       PCP:  [name], PA-C      Insurance: Payor: [place] [employer] MEDICARE / Plan: [employer] MEDICARE HMO/PPO / Product Type: *No Product type* /   CC:    Chief Complaint  Patient presents with  . Congestive Heart Failure  . Hypertension    VS Vitals:   04/24/16 1531  BP: (!) 124/50  Pulse: (!) 54  SpO2: 95%  Weight: 175 lb (79.4 kg)  Height: '5\' 3"'$  (1.6 m)    Weights Current Weight  04/24/16 175 lb (79.4 kg)  04/19/16 174 lb 3.2 oz (79 kg)  04/14/16 170 lb (77.1 kg)    Blood Pressure  BP Readings from Last 3 Encounters:  04/24/16 (!) 124/50  04/19/16 (!) 148/68  04/14/16 (!) 166/71     Admit date:  (Not on file) Last encounter with RMR:  11/10/2015   Allergy Ace inhibitors; Actos [pioglitazone]; Aspirin; and Metformin and related  Current Outpatient Prescriptions  Medication Sig Dispense Refill  . acetaminophen (TYLENOL) 500 MG tablet Take 500 mg by mouth every 6 (six) hours as needed for pain.    Marland Kitchen amLODipine (NORVASC) 10 MG tablet Take 1 tablet (10 mg total) by mouth daily. 90 tablet 3  . aspirin EC 81 MG tablet Take 81 mg by mouth daily.    . Blood Glucose Monitoring Suppl (ONE TOUCH ULTRA SYSTEM KIT) w/Device KIT 1 meter 1 each 0  . calcitRIOL (ROCALTROL) 0.25 MCG capsule Take 0.25 mcg by mouth every Monday, Wednesday, and Friday.     . Cholecalciferol (VITAMIN D) 2000 UNITS CAPS Take 1 capsule by mouth daily.    . cloNIDine (CATAPRES) 0.2 MG tablet TAKE ONE TABLET BY MOUTH TWICE DAILY 60 tablet 3  . furosemide (LASIX) 40 MG tablet Take 1 tablet (40 mg total) by mouth daily. 90 tablet 3  . gabapentin (NEURONTIN) 300 MG capsule Day 1: Take 1 at night.  Day 2: Take 1 twice a day Day 3: take one 3 times a day Cont. Taking one 3 times a day 90 capsule 3  . glucose blood test strip 1 each by Other route 4 (four) times daily -  before meals and at bedtime. Use as instructed 150 each 12  . glucose blood  test strip Use as directed. Check blood sugar 3 times daily before meals and at bedtime. 100 each 12  . HYDROcodone-acetaminophen (NORCO/VICODIN) 5-325 MG tablet Take 1 tablet by mouth every 6 (six) hours as needed for severe pain. 10 tablet 0  . insulin aspart (NOVOLOG) 100 UNIT/ML FlexPen Take 7 units with luch and supper.  Inject 15 minutes prior to meal or immediately after meal. 15 mL 11  . Lancets (ONETOUCH ULTRASOFT) lancets Use as instructed 100 each 12  . LEVEMIR FLEXTOUCH 100 UNIT/ML Pen INJECT 22 UNITS SUBCUTANEOUSLY ONCE DAILY AT  10  PM 7 pen 2  . omeprazole (PRILOSEC) 20 MG capsule TAKE ONE CAPSULE BY MOUTH ONCE DAILY 90 capsule 3  . polyethylene glycol (MIRALAX / GLYCOLAX) packet Take 17 g by mouth daily. 14 each 0  . psyllium (HYDROCIL/METAMUCIL) 95 % PACK Take 1 packet by mouth 3 (three) times daily. 56 each 0  . RELION SHORT PEN NEEDLES 31G X 8 MM MISC USE ONE PEN NEEDLE ONCE DAILY AS DIRECTED 50 each 11  . simvastatin (ZOCOR) 20 MG tablet TAKE ONE TABLET BY MOUTH ONCE DAILY AT  BEDTIME 90 tablet 3   No current facility-administered medications for this visit.     Discontinued Meds:    Medications Discontinued During This Encounter  Medication Reason  . valACYclovir (VALTREX) 1000 MG tablet Error    Patient Active Problem List   Diagnosis Date Noted  . PAH (pulmonary artery hypertension)   . History of atrial septal defect repair   . Leukocytosis 04/06/2016  . Diastolic CHF, acute on chronic (HCC) 04/05/2016  . SBO (small bowel obstruction) 02/17/2016  . Chronic diastolic congestive heart failure ([place]) 02/17/2016  . Diabetes mellitus with complication ([place]) 84/53/6468  . Pyuria 02/17/2016  . GERD (gastroesophageal reflux disease) 04/05/2015  . Diastolic dysfunction 04/13/2246  . Acute diverticulitis 05/03/2014  . Acute appendicitis with peritoneal abscess 09/27/2013  . Bradycardia 04/02/2013  . Hyperkalemia 01/22/2013  . Acute renal failure ([place]) 01/21/2013  .  Anemia of chronic disease 01/21/2013  . Acute bronchitis 01/20/2013  . CHF exacerbation ([place]) 01/19/2013  . CKD (chronic kidney disease) stage 3, GFR 30-59 ml/min 12/25/2012  . Screening for colorectal cancer 12/25/2012  . Breast cancer screening 12/25/2012  . Vitamin D deficiency   . Osteoporosis   . Postop check 08/13/2012  . Appendiceal abscess 07/30/2012  . Fever, unspecified 07/19/2012  . Abdominal pain, other specified site 07/19/2012  . SOB (shortness of breath) 07/19/2012  . ARF (acute renal failure) ([place]) 07/19/2012  . Abscess, appendix 07/19/2012  . Acute diastolic heart failure ([place]) 02/04/2011  . Type 2 diabetes mellitus with renal manifestations ([place]) 02/02/2011  . Pulmonary hypertension 02/02/2011  . COPD exacerbation ([place]) 02/02/2011  . Hypokalemia 02/02/2011  . Normocytic anemia 02/02/2011  . Essential hypertension   . Hyperlipidemia     LABS    Component Value Date/Time   NA 137 04/08/2016 0436   NA 136 04/07/2016 0538   NA 138 04/06/2016 0219   K 4.6 04/08/2016 0436   K 4.0 04/07/2016 0538   K 3.6 04/06/2016 0219   CL 98 (L) 04/08/2016 0436   CL 98 (L) 04/07/2016 0538   CL 104 04/06/2016 0219   CO2 30 04/08/2016 0436   CO2 29 04/07/2016 0538   CO2 27 04/06/2016 0219   GLUCOSE 166 (H) 04/08/2016 0436   GLUCOSE 202 (H) 04/07/2016 0538   GLUCOSE 178 (H) 04/06/2016 0219   GLUCOSE 163 (H) 07/19/2012 1005   GLUCOSE 181 (H) 06/13/2012 1129   BUN 41 (H) 04/08/2016 0436   BUN 34 (H) 04/07/2016 0538   BUN 19 04/06/2016 0219   CREATININE 1.96 (H) 04/08/2016 0436   CREATININE 2.11 (H) 04/07/2016 0538   CREATININE 1.83 (H) 04/06/2016 0219   CREATININE 1.54 (H) 01/20/2016 1202   CREATININE 2.13 (H) 10/18/2015 1106   CREATININE 2.21 (H) 07/14/2015 1142   CALCIUM 8.8 (L) 04/08/2016 0436   CALCIUM 8.5 (L) 04/07/2016 0538   CALCIUM 8.6 (L) 04/06/2016 0219   GFRNONAA 24 (L) 04/08/2016 0436   GFRNONAA 22 (L) 04/07/2016 0538   GFRNONAA 26 (L) 04/06/2016 0219    GFRNONAA 33 (L) 01/20/2016 1202   GFRNONAA 22 (L) 10/18/2015 1106   GFRNONAA 21 (L) 07/14/2015 1142   GFRAA 28 (L) 04/08/2016 0436   GFRAA 25 (L) 04/07/2016 0538   GFRAA 30 (L) 04/06/2016 0219   GFRAA 38 (L) 01/20/2016 1202   GFRAA 26 (L) 10/18/2015 1106   GFRAA 25 (L) 07/14/2015 1142   CMP     Component Value Date/Time   NA 137 04/08/2016 0436   K 4.6 04/08/2016  0436   CL 98 (L) 04/08/2016 0436   CO2 30 04/08/2016 0436   GLUCOSE 166 (H) 04/08/2016 0436   GLUCOSE 163 (H) 07/19/2012 1005   BUN 41 (H) 04/08/2016 0436   CREATININE 1.96 (H) 04/08/2016 0436   CREATININE 1.54 (H) 01/20/2016 1202   CALCIUM 8.8 (L) 04/08/2016 0436   PROT 7.9 04/05/2016 1406   ALBUMIN 4.0 04/05/2016 1406   AST 23 04/05/2016 1406   ALT 14 04/05/2016 1406   ALKPHOS 97 04/05/2016 1406   BILITOT 0.9 04/05/2016 1406   GFRNONAA 24 (L) 04/08/2016 0436   GFRNONAA 33 (L) 01/20/2016 1202   GFRAA 28 (L) 04/08/2016 0436   GFRAA 38 (L) 01/20/2016 1202       Component Value Date/Time   WBC 6.8 04/06/2016 0219   WBC 10.8 (H) 04/05/2016 1406   WBC 7.9 02/19/2016 0545   HGB 10.6 (L) 04/06/2016 0219   HGB 12.8 04/05/2016 1406   HGB 11.6 (L) 02/19/2016 0545   HCT 32.6 (L) 04/06/2016 0219   HCT 39.6 04/05/2016 1406   HCT 36.8 02/19/2016 0545   MCV 85.3 04/06/2016 0219   MCV 85.3 04/05/2016 1406   MCV 87.8 02/19/2016 0545    Lipid Panel     Component Value Date/Time   CHOL 159 01/20/2016 1202   TRIG 132 01/20/2016 1202   HDL 61 01/20/2016 1202   CHOLHDL 2.6 01/20/2016 1202   VLDL 26 01/20/2016 1202   LDLCALC 72 01/20/2016 1202    ABG    Component Value Date/Time   TCO2 22 12/24/2008 2235     Lab Results  Component Value Date   TSH 1.316 01/19/2013   BNP (last 3 results)  Recent Labs  04/05/16 1406  BNP 423.1*    ProBNP (last 3 results) No results for input(s): PROBNP in the last 8760 hours.  Cardiac Panel (last 3 results) No results for input(s): CKTOTAL, CKMB, TROPONINI, RELINDX  in the last 72 hours.  Iron/TIBC/Ferritin/ %Sat    Component Value Date/Time   IRON 25 (L) 01/19/2013 0830   TIBC 233 (L) 01/19/2013 0830   FERRITIN 301 (H) 01/19/2013 0830   IRONPCTSAT 11 (L) 01/19/2013 0830     EKG Orders placed or performed during the hospital encounter of 04/05/16  . EKG 12-Lead  . EKG 12-Lead  . EKG 12-Lead  . EKG 12-Lead  . EKG     Prior Assessment and Plan Problem List as of 04/24/2016 Reviewed: 04/19/2016 12:26 PM by [name], PA-C     Cardiovascular and Mediastinum   Essential hypertension   Last Assessment & Plan 01/13/2015 Office Visit Written 01/13/2015  2:54 PM by Erlene Quan, PA-C    Controlled      Pulmonary hypertension   Acute diastolic heart failure [facility])   Last Assessment & Plan 01/09/2013 Office Visit Written 01/09/2013  2:07 PM by Lendon Colonel, NP    No evidence of fluid retention or decompensation. Continue current treatment plan. Follow up with new cardiologist on next visit.      CHF exacerbation [facility])   Last Assessment & Plan 03/17/2013 Office Visit Written 03/17/2013  3:53 PM by Lendon Colonel, NP    No evidence of heart failure, fluid retention, or symptoms thereof. She has gained some weight, but I do not find this to be fluid related. She states she has been eating more with her new diabetic medication causing her to feel hungry. She is also eating healthier. Have asked her to please continue  to weigh  daily on a digital scale and record as she does her blood pressure.      Chronic diastolic congestive heart failure (HCC)   Diastolic CHF, acute on chronic (HCC)   PAH (pulmonary artery hypertension)     Respiratory   COPD exacerbation [facility])   Last Assessment & Plan 02/20/2011 Office Visit Written 02/20/2011 12:27 PM by Lendon Colonel, NP    Breathing status is stable. No excessive DOE.      Acute bronchitis     Digestive   Abscess, appendix   Appendiceal abscess   Acute appendicitis with peritoneal  abscess   GERD (gastroesophageal reflux disease)   SBO (small bowel obstruction)     Endocrine   Type 2 diabetes mellitus with renal manifestations [facility])   Last Assessment & Plan 08/27/2015 Office Visit Written 08/29/2015  2:42 PM by Alycia Rossetti, MD    Diabetes is fairly well controlled, but baesd on symptoms I think she has peripheral neuropthy,decreased monofilament today mostly in feet Trial of gabapentin as inhibiting her activites, start with '100mg'$ , can increase to '200mg'$  Her blood flow appears to be adequate. F/u in a few weeks see how she is doing with gabapentin      Diabetes mellitus with complication ([place])     Musculoskeletal and Integument   Osteoporosis     Genitourinary   CKD (chronic kidney disease) stage 3, GFR 30-59 ml/min   Last Assessment & Plan 01/13/2015 Office Visit Written 01/13/2015  2:57 PM by Erlene Quan, PA-C    Followed by PCP- GFR 30      ARF (acute renal failure) [facility])   Last Assessment & Plan 07/19/2012 Office Visit Written 07/19/2012  2:47 PM by Alycia Rossetti, MD    Acute renal failure noted on labs 3 days ago this will need to be repeated to see if this has worsened      Acute renal failure ([place])     Other   Hyperlipidemia   Last Assessment & Plan 01/13/2015 Office Visit Written 01/13/2015  2:59 PM by Erlene Quan, PA-C    Controlled- LDL 77 Dec 8588      Diastolic dysfunction   Last Assessment & Plan 01/13/2015 Office Visit Written 01/13/2015  2:56 PM by Erlene Quan, PA-C    Grade 2 by echo Dec 2014      Hypokalemia   Normocytic anemia   Fever, unspecified   Last Assessment & Plan 07/19/2012 Office Visit Written 07/19/2012  2:47 PM by Alycia Rossetti, MD    Patient has some type of infection brewing for white count in the office was 19,000 this is up from 16,003 days ago. Differentials include pneumonia versus diverticulitis or other abdominal infection. Send to emergency room for repeat chest x-ray CT scan IV fluids.       Abdominal pain, other specified site   Last Assessment & Plan 07/19/2012 Office Visit Written 07/19/2012  2:47 PM by Alycia Rossetti, MD    Per above, send to ER for further work-up , CT scan      SOB (shortness of breath)   Last Assessment & Plan 03/17/2013 Office Visit Written 03/17/2013  3:52 PM by Lendon Colonel, NP    She wishes to stop her oxygen at home. She states that her breathing status is fine she does like it short of breath and would like them to come and pick it up. I checked her oxygen saturation here in the office  and it was 98%. If she is not using it or wearing it, I have advised her to call them and have a oxygen provider to pick it up.       Postop check   Vitamin D deficiency   Screening for colorectal cancer   Breast cancer screening   Anemia of chronic disease   Hyperkalemia   Bradycardia   Acute diverticulitis   Pyuria   Leukocytosis   History of atrial septal defect repair       Imaging: Ct Abdomen Pelvis Wo Contrast  Result Date: 04/05/2016 CLINICAL DATA:  Abdominal pain, distention. History of small bowel obstruction. Rule out obstruction. EXAM: CT ABDOMEN AND PELVIS WITHOUT CONTRAST TECHNIQUE: Multidetector CT imaging of the abdomen and pelvis was performed following the standard protocol without IV contrast. COMPARISON:  02/17/2016 FINDINGS: Lower chest: Heart is mildly enlarged. Scarring in the bases. No effusions. Hepatobiliary: No focal hepatic abnormality. Gallbladder unremarkable. Pancreas: No focal abnormality or ductal dilatation. Spleen: No focal abnormality.  Normal size. Adrenals/Urinary Tract: Punctate nonobstructing stone in the midpole of the right kidney. No hydronephrosis. Adrenal glands and urinary bladder are unremarkable. Stomach/Bowel: Moderate stool throughout the colon. Sigmoid diverticulosis. No active diverticulitis. No small bowel distention to suggest small bowel obstruction. Stomach decompressed, unremarkable. Vascular/Lymphatic:  Heavily calcified aorta and iliac vessels. No aneurysm. No adenopathy. Reproductive: Uterus and adnexa unremarkable.  No mass. Other: No free fluid or free air. Small umbilical and supraumbilical ventral hernias containing fat. Musculoskeletal: No acute bony abnormality. IMPRESSION: No evidence of bowel obstruction. Small umbilical and ventral hernias containing fat, unchanged. Heavily calcified aorta and iliac vessels. No acute findings in the abdomen or pelvis. Electronically Signed   By: Rolm Baptise M.D.   On: 04/05/2016 17:43   Dg Chest 2 View  Result Date: 04/05/2016 CLINICAL DATA:  Shortness of breath. EXAM: CHEST  2 VIEW COMPARISON:  02/17/2016 . FINDINGS: Median sternotomy. Cardiomegaly with pulmonary vascular prominence. No focal alveolar infiltrate. Mild basilar atelectasis on the left. No acute bony abnormality. Carotid vascular calcification . Sliding hiatal hernia. IMPRESSION: 1. Prior median sternotomy. Cardiomegaly with pulmonary vascular congestion. 2.  Mild left base atelectasis. 3. Sliding hiatal hernia. 4. Carotid vascular disease. Electronically Signed   By: Marcello Moores  Register   On: 04/05/2016 14:53

## 2016-05-01 DIAGNOSIS — R69 Illness, unspecified: Secondary | ICD-10-CM | POA: Diagnosis not present

## 2016-05-10 ENCOUNTER — Other Ambulatory Visit: Payer: Self-pay | Admitting: Physician Assistant

## 2016-05-10 NOTE — Telephone Encounter (Signed)
Medication refilled per protocol. 

## 2016-05-11 ENCOUNTER — Encounter: Payer: Self-pay | Admitting: Physician Assistant

## 2016-05-11 ENCOUNTER — Ambulatory Visit (INDEPENDENT_AMBULATORY_CARE_PROVIDER_SITE_OTHER): Payer: Medicare HMO | Admitting: Physician Assistant

## 2016-05-11 VITALS — BP 142/64 | HR 62 | Temp 97.8°F | Resp 12 | Wt 180.8 lb

## 2016-05-11 DIAGNOSIS — E1142 Type 2 diabetes mellitus with diabetic polyneuropathy: Secondary | ICD-10-CM | POA: Diagnosis not present

## 2016-05-11 DIAGNOSIS — B029 Zoster without complications: Secondary | ICD-10-CM

## 2016-05-11 MED ORDER — GABAPENTIN 100 MG PO CAPS: ORAL_CAPSULE | ORAL | 3 refills | Status: DC

## 2016-05-11 NOTE — Progress Notes (Signed)
Patient ID: TEEGAN Best MRN: [id number], DOB: 05/04/1940, 76 y.o. Date of Encounter: '@DATE'$ @  Chief Complaint:  Chief Complaint  Patient presents with  . Shingles f/u    HPI: 76 y.o. year old female   [place] NOTE ON 04/19/2016: 76 04/19/2016:  presents for OV to f/u Hospitalization.   She has actually had 2 recent hospitalizations and one recent ER visit. Today I have reviewed all of these.  She had ER visit 04/14/16 and was diagnosed with shingles. They prescribed Valtrex 3 times a day 7 days. They also prescribed hydrocodone 5 mg #10. Today she reports that she is still experiencing burning pain and points to her right low back and says that is going all the way down her right leg. Points to area of rash on her right buttock. States that this is the only area of rash. She states that she still has some pain pills remaining. Says that they told her to only use those for severe pain. I asked if she was using any Tylenol or anything else for pain and she says  "no honey,  they told me this has Tylenol and codeine all in it and not to take any additional medicine with this."   Regarding Note above----on 05/06/2016 I deleted portion of note pertaining to other hospitalization and only kept the note pertaining to Shingles.  The Assessment/Plan portion of that note -- pertaining to Shingles--was--copied from 04/19/2016 OV NOTE--  Herpes zoster without complication Regarding her burning pain--PostHerpetic Neuralgia----currently she is on Neurontin 100 mg 1-2 daily at bedtime for peripheral neuropathy secondary to her diabetes. Today I have removed this from her medicine list so that it will not get confused and have told her to stop this.  While she is here during the visit-- she has gotten this one medicine bottle out of her bag of medicines and knows that this is the bottle for her to put to the side and not take right now. She is going to take the following  gabapentin dose. Told her that once she gets up to taking one 3 times daily, that if her burning pain is not controlled with this-- to call me and I can further adjust the dose to control her symptoms. Will have her schedule follow-up visit for 3 weeks and we have noted on that schedule for that OV to "follow-up shingles " and at that visit if her burning pain is resolved will decrease back to her prior dose of gabapentin 100 mg 1-2 daily at bedtime for neuropathy.  - gabapentin (NEURONTIN) 300 MG capsule; Day 1: Take 1 at night.  Day 2: Take 1 twice a day Day 3: take one 3 times a day Cont. Taking one 3 times a day  Dispense: 90 capsule; Refill: 3    ---------------------------------TODAY-----05/10/2016:---------------------------------------  Today patient states that she has been taking the gabapentin 300 mg 3 times a day. She reports that she has had no burning pain in her back or leg at all. She has no complaints or concerns to address today.  Past Medical History:  Diagnosis Date  . Arthritis    "hands, feet" (02/17/2016)  . CHF (congestive heart failure) ([place])   . CKD (chronic kidney disease) stage 3, GFR 30-59 ml/min 12/25/2012  . COPD (chronic obstructive pulmonary disease) ([place])   . Diastolic heart failure ([place])   . Hyperlipidemia   . Hypertension   . Osteoporosis   . Osteoporosis   .  Pneumonia    "several times" (02/17/2016)  . Small bowel obstruction ([place]) 02/17/2016  . Tricuspid regurgitation    Echo 05/02/06-Nml LV. Mild MR. Mod TR  . Type II diabetes mellitus ([place])   . Vitamin D deficiency      Home Meds: Outpatient Medications Prior to Visit  Medication Sig Dispense Refill  . acetaminophen (TYLENOL) 500 MG tablet Take 500 mg by mouth every 6 (six) hours as needed for pain.    Marland Kitchen amLODipine (NORVASC) 10 MG tablet Take 1 tablet (10 mg total) by mouth daily. 90 tablet 3  . aspirin EC 81 MG tablet Take 81 mg by mouth daily.    . Blood Glucose Monitoring Suppl (ONE TOUCH  ULTRA SYSTEM KIT) w/Device KIT 1 meter 1 each 0  . calcitRIOL (ROCALTROL) 0.25 MCG capsule Take 0.25 mcg by mouth every Monday, Wednesday, and Friday.     . Cholecalciferol (VITAMIN D) 2000 UNITS CAPS Take 1 capsule by mouth daily.    . cloNIDine (CATAPRES) 0.2 MG tablet TAKE ONE TABLET BY MOUTH TWICE DAILY 60 tablet 3  . furosemide (LASIX) 40 MG tablet Take 1 tablet (40 mg total) by mouth daily. 90 tablet 3  . glucose blood test strip 1 each by Other route 4 (four) times daily -  before meals and at bedtime. Use as instructed 150 each 12  . glucose blood test strip Use as directed. Check blood sugar 3 times daily before meals and at bedtime. 100 each 12  . insulin aspart (NOVOLOG) 100 UNIT/ML FlexPen Take 7 units with luch and supper.  Inject 15 minutes prior to meal or immediately after meal. 15 mL 11  . Lancets (ONETOUCH ULTRASOFT) lancets Use as instructed 100 each 12  . LEVEMIR FLEXTOUCH 100 UNIT/ML Pen INJECT 22 UNITS SUBCUTANEOUSLY ONCE DAILY AT  10  PM 7 pen 2  . omeprazole (PRILOSEC) 20 MG capsule TAKE ONE CAPSULE BY MOUTH ONCE DAILY 90 capsule 3  . polyethylene glycol (MIRALAX / GLYCOLAX) packet Take 17 g by mouth daily. 14 each 0  . psyllium (HYDROCIL/METAMUCIL) 95 % PACK Take 1 packet by mouth 3 (three) times daily. 56 each 0  . RELION SHORT PEN NEEDLES 31G X 8 MM MISC USE ONE PEN NEEDLE ONCE DAILY AS DIRECTED 50 each 11  . simvastatin (ZOCOR) 20 MG tablet TAKE ONE TABLET BY MOUTH ONCE DAILY AT BEDTIME 90 tablet 3  . gabapentin (NEURONTIN) 300 MG capsule Day 1: Take 1 at night.  Day 2: Take 1 twice a day Day 3: take one 3 times a day Cont. Taking one 3 times a day 90 capsule 3  . furosemide (LASIX) 40 MG tablet TAKE ONE TABLET BY MOUTH ONCE DAILY 90 tablet 0  . HYDROcodone-acetaminophen (NORCO/VICODIN) 5-325 MG tablet Take 1 tablet by mouth every 6 (six) hours as needed for severe pain. 10 tablet 0   No facility-administered medications prior to visit.     Allergies:  Allergies    Allergen Reactions  . Ace Inhibitors Other (See Comments)    Hyperkalemia  . Actos [Pioglitazone] Swelling  . Aspirin Other (See Comments)    G.I. Upset.   . Metformin And Related Diarrhea    Social History   Social History  . Marital status: Widowed    Spouse name: N/A  . Number of children: N/A  . Years of education: N/A   Occupational History  . Not on file.   Social History Main Topics  . Smoking status: Former Smoker  Packs/day: 0.50    Years: 50.00    Types: Cigarettes    Quit date: 01/19/2009  . Smokeless tobacco: Never Used  . Alcohol use No  . Drug use: No  . Sexual activity: No   Other Topics Concern  . Not on file   Social History Narrative   Entered 10/2013:   She has never driven.   She lives with her son.    Family History  Problem Relation Age of Onset  . Cancer Father   . Diabetes Father   . Cancer Brother     Brain  . Cancer Sister     abdominal fat     Review of Systems:  See HPI for pertinent ROS. All other ROS negative.    Physical Exam: Blood pressure (!) 142/64, pulse 62, temperature 97.8 F (36.6 C), temperature source Oral, resp. rate 12, weight 180 lb 12.8 oz (82 kg), SpO2 95 %., Body mass index is 32.03 kg/m. General:WNWD WF. Appears in no acute distress. Neck: Supple. No thyromegaly. No lymphadenopathy. Lungs: Clear bilaterally to auscultation without wheezes, rales, or rhonchi. Breathing is unlabored. Heart: RRR with S1 S2. No murmurs, rubs, or gallops. Abdomen: Soft, non-tender, non-distended with normoactive bowel sounds. No hepatomegaly. No rebound/guarding. No obvious abdominal masses. Musculoskeletal:  Strength and tone normal for age. Extremities/Skin: Warm and dry.  No LE edema.  Neuro: Alert and oriented X 3. Moves all extremities spontaneously. Gait is normal. CNII-XII grossly in tact. Psych:  Responds to questions appropriately with a normal affect.     ASSESSMENT AND PLAN:  76 y.o. year old female with     1. Herpes zoster without complication 2. Diabetic peripheral neuropathy associated with type 2 diabetes mellitus (HCC) - gabapentin (NEURONTIN) 100 MG capsule; Take 1 -2 at night for peripheral neuropathy  Dispense: 60 capsule; Refill: 3  She is having no postherpetic neuralgia symptoms at this time. At this time will try to decrease her back down to her prior dose of gabapentin that she had been on for her diabetic peripheral neuropathy. If she starts experiencing burning pain that is not controlled at this lower dose she is to call me. She voices understanding and agrees.  [address] [place], [place], [facility] 05/11/2016 12:24 PM

## 2016-05-16 ENCOUNTER — Ambulatory Visit (HOSPITAL_COMMUNITY)
Admission: RE | Admit: 2016-05-16 | Discharge: 2016-05-16 | Disposition: A | Discharge status: Home / Self Care | Payer: Medicare HMO | Source: Ambulatory Visit | Attending: Cardiology | Admitting: Cardiology

## 2016-05-16 ENCOUNTER — Encounter (HOSPITAL_COMMUNITY): Payer: Self-pay

## 2016-05-16 ENCOUNTER — Encounter (HOSPITAL_COMMUNITY): Payer: Self-pay | Admitting: *Deleted

## 2016-05-16 ENCOUNTER — Other Ambulatory Visit (HOSPITAL_COMMUNITY): Payer: Self-pay | Admitting: *Deleted

## 2016-05-16 VITALS — BP 146/61 | HR 61 | Wt 177.8 lb

## 2016-05-16 DIAGNOSIS — N183 Chronic kidney disease, stage 3 unspecified: Secondary | ICD-10-CM

## 2016-05-16 DIAGNOSIS — I130 Hypertensive heart and chronic kidney disease with heart failure and stage 1 through stage 4 chronic kidney disease, or unspecified chronic kidney disease: Secondary | ICD-10-CM | POA: Insufficient documentation

## 2016-05-16 DIAGNOSIS — Z9889 Other specified postprocedural states: Secondary | ICD-10-CM | POA: Insufficient documentation

## 2016-05-16 DIAGNOSIS — I272 Pulmonary hypertension, unspecified: Secondary | ICD-10-CM

## 2016-05-16 DIAGNOSIS — E785 Hyperlipidemia, unspecified: Secondary | ICD-10-CM | POA: Insufficient documentation

## 2016-05-16 DIAGNOSIS — Z7982 Long term (current) use of aspirin: Secondary | ICD-10-CM | POA: Diagnosis not present

## 2016-05-16 DIAGNOSIS — Z79899 Other long term (current) drug therapy: Secondary | ICD-10-CM | POA: Diagnosis not present

## 2016-05-16 DIAGNOSIS — J449 Chronic obstructive pulmonary disease, unspecified: Secondary | ICD-10-CM | POA: Insufficient documentation

## 2016-05-16 DIAGNOSIS — I5032 Chronic diastolic (congestive) heart failure: Secondary | ICD-10-CM | POA: Insufficient documentation

## 2016-05-16 DIAGNOSIS — E1122 Type 2 diabetes mellitus with diabetic chronic kidney disease: Secondary | ICD-10-CM | POA: Diagnosis not present

## 2016-05-16 DIAGNOSIS — Z794 Long term (current) use of insulin: Secondary | ICD-10-CM | POA: Insufficient documentation

## 2016-05-16 DIAGNOSIS — Z808 Family history of malignant neoplasm of other organs or systems: Secondary | ICD-10-CM | POA: Diagnosis not present

## 2016-05-16 DIAGNOSIS — Z833 Family history of diabetes mellitus: Secondary | ICD-10-CM | POA: Diagnosis not present

## 2016-05-16 DIAGNOSIS — I270 Primary pulmonary hypertension: Secondary | ICD-10-CM

## 2016-05-16 LAB — BASIC METABOLIC PANEL
ANION GAP: 9 (ref 5–15)
BUN: 26 mg/dL — AB (ref 6–20)
CO2: 25 mmol/L (ref 22–32)
Calcium: 8.7 mg/dL — ABNORMAL LOW (ref 8.9–10.3)
Chloride: 104 mmol/L (ref 101–111)
Creatinine, Ser: 1.62 mg/dL — ABNORMAL HIGH (ref 0.44–1.00)
GFR calc Af Amer: 35 mL/min — ABNORMAL LOW (ref >60)
GFR, EST NON AFRICAN AMERICAN: 30 mL/min — AB (ref >60)
GLUCOSE: 172 mg/dL — AB (ref 65–99)
POTASSIUM: 4.3 mmol/L (ref 3.5–5.1)
Sodium: 138 mmol/L (ref 135–145)

## 2016-05-16 LAB — CBC
HEMATOCRIT: 36 % (ref 36.0–46.0)
HEMOGLOBIN: 11.6 g/dL — AB (ref 12.0–15.0)
MCH: 28 pg (ref 26.0–34.0)
MCHC: 32.2 g/dL (ref 30.0–36.0)
MCV: 87 fL (ref 78.0–100.0)
Platelets: 218 10*3/uL (ref 150–400)
RBC: 4.14 MIL/uL (ref 3.87–5.11)
RDW: 16 % — ABNORMAL HIGH (ref 11.5–15.5)
WBC: 5.7 10*3/uL (ref 4.0–10.5)

## 2016-05-16 LAB — TSH: TSH: 3.349 u[IU]/mL (ref 0.350–4.500)

## 2016-05-16 LAB — BRAIN NATRIURETIC PEPTIDE: B Natriuretic Peptide: 327.7 pg/mL — ABNORMAL HIGH (ref 0.0–100.0)

## 2016-05-16 NOTE — Patient Instructions (Addendum)
Increase Lasix to 40 mg (1 Tablet) 2 Times Daily for 3 Days ONLY.  Then take 40 mg (1 Tablet) in the AM and 20 mg (0.5 Tablet) in the PM for 3 Days Only.    Labs in 10 days (BMET)  You have been scheduled for a Right Heart Cath next Wednesday May 2nd @ 8:00AM.  Please see attached instructions.   A Lexiscan Stress Test has been ordered for you, we will schedule this for you at checkout.   Follow up in 3 weeks

## 2016-05-17 NOTE — Progress Notes (Signed)
PCP: [name] Cardiology: Dr. Harl Bowie HF Cardiology: Dr. Aundra Dubin  76 yo with history of CKD stage 3, HTN, COPD, ASD repair, and chronic diastolic CHF was referred by Dr. Harl Bowie for evaluation of CHF and worsening dyspnea.   Patient was admitted in 3/18 with volume overload/diastolic CHF and was diuresed.  She developed AKI.  She was eventually discharged on Lasix 40 mg daily (had been on this prior to admission).  She has had one earlier CHF hospitalization back in 2014.    Currently, she is short of breath walking 50-100 feet (has to stop).  She cannot climb stairs due to dyspnea.  Occasional palpitations, nothing prolonged.  No chest pain.  She has had orthopnea x years (sleeps on 4 pillows).  She has significant peripheral edema.    Labs (3/18): K 4.6, creatinine 1.96  ECG (3/18, personally reviewed): NSR versus ectopic atrial rhythm with IVCD.   PMH: 1. CKD stage 3.  2. HTN 3. Type II diabetes 4. Hyperlipidemia 5. ASD repair:  Surgical by Dr. Servando Snare in 1993.  6. h/o shingles 7. COPD 8. Chronic diastolic CHF:  - Echo (2/83) with EF 55-60%, moderate LVH, grade II diastolic dysfunction, RV moderately dilated and moderately dysfunctional, PASP 49 mmHg.   SH: Lives in [place], nonsmoker, no ETOH.    Family History  Problem Relation Age of Onset  . Cancer Father   . Diabetes Father   . Cancer Brother     Brain  . Cancer Sister     abdominal fat   ROS: All systems reviewed and negative except as per HPI.   Current Outpatient Prescriptions  Medication Sig Dispense Refill  . acetaminophen (TYLENOL) 500 MG tablet Take 500 mg by mouth every 6 (six) hours as needed for pain.    Marland Kitchen amLODipine (NORVASC) 10 MG tablet Take 1 tablet (10 mg total) by mouth daily. 90 tablet 3  . aspirin EC 81 MG tablet Take 81 mg by mouth daily.    . Blood Glucose Monitoring Suppl (ONE TOUCH ULTRA SYSTEM KIT) w/Device KIT 1 meter 1 each 0  . calcitRIOL (ROCALTROL) 0.25 MCG capsule Take 0.25 mcg  by mouth every Monday, Wednesday, and Friday.     . Cholecalciferol (VITAMIN D) 2000 UNITS CAPS Take 1 capsule by mouth daily.    . cloNIDine (CATAPRES) 0.2 MG tablet TAKE ONE TABLET BY MOUTH TWICE DAILY 60 tablet 3  . furosemide (LASIX) 40 MG tablet Take 1 tablet (40 mg total) by mouth daily. 90 tablet 3  . gabapentin (NEURONTIN) 100 MG capsule Take 1 -2 at night for peripheral neuropathy 60 capsule 3  . glucose blood test strip 1 each by Other route 4 (four) times daily -  before meals and at bedtime. Use as instructed 150 each 12  . glucose blood test strip Use as directed. Check blood sugar 3 times daily before meals and at bedtime. 100 each 12  . insulin aspart (NOVOLOG) 100 UNIT/ML FlexPen Take 7 units with luch and supper.  Inject 15 minutes prior to meal or immediately after meal. 15 mL 11  . Lancets (ONETOUCH ULTRASOFT) lancets Use as instructed 100 each 12  . LEVEMIR FLEXTOUCH 100 UNIT/ML Pen INJECT 22 UNITS SUBCUTANEOUSLY ONCE DAILY AT  10  PM 7 pen 2  . omeprazole (PRILOSEC) 20 MG capsule TAKE ONE CAPSULE BY MOUTH ONCE DAILY 90 capsule 3  . polyethylene glycol (MIRALAX / GLYCOLAX) packet Take 17 g by mouth daily. 14 each 0  . psyllium (  HYDROCIL/METAMUCIL) 95 % PACK Take 1 packet by mouth 3 (three) times daily. 56 each 0  . RELION SHORT PEN NEEDLES 31G X 8 MM MISC USE ONE PEN NEEDLE ONCE DAILY AS DIRECTED 50 each 11  . simvastatin (ZOCOR) 20 MG tablet TAKE ONE TABLET BY MOUTH ONCE DAILY AT BEDTIME 90 tablet 3   No current facility-administered medications for this encounter.    BP (!) 146/61   Pulse 61   Wt 177 lb 12 oz (80.6 kg)   SpO2 97%   BMI 31.49 kg/m  General: NAD Neck: JVP 10 cm with HJR, no thyromegaly or thyroid nodule.  Lungs: Crackles at bases bilaterally CV: Nondisplaced PMI.  Heart regular S1/S2, no S3/S4, 2/6 SEM RUSB with clear S2.  1+ ankle edema.  No carotid bruit.  Normal pedal pulses.  Abdomen: Soft, nontender, no hepatosplenomegaly, no distention.  Skin:  Intact without lesions or rashes.  Neurologic: Alert and oriented x 3.  Psych: Normal affect. Extremities: No clubbing or cyanosis.  HEENT: Normal.   Assessment/Plan: 1. Chronic diastolic CHF: Echo in 6/44 with moderate LVH, grade II diastolic dysfunction, normal EF, PASP 49 mmHg with RV dysfunction.  Her main problem may indeed be diastolic CHF.  However, need to consider possibility of the development of pulmonary arterial hypertension given late closure of ASD (in her 68s) which may have allowed irreversible pulmonary vascular changes. On exam, she appears volume overloaded.  NYHA class III symptoms.  - Increase Lasix to 40 mg bid x 3 days, then 40 qam/20 qpm.  BMET/BNP today and repeat BMET in 10 days.  - Needs RHC to define diastolic CHF/pulmonary venous hypertension versus pulmonary arterial hypertension from pulmonary vascular remodeling with late ASD closure versus combination.  PAH could potentially be treated with selective pulmonary vasodilators.  I discussed risks/benefits with her and she agrees to the procedure.  - Given significant exertional dyspnea, I think some form of ischemic evaluation is reasonable.  If creatinine today is improved compared to rior, I will plan on coronary angiography along with RHC.  If not, will arrange for Lexiscan Cardiolite.  2. CKD: Stage 3, follow carefully with diuresis.  3. H/o ASD repair: Surgical closure in her 60s.   Followup in 3 wks.   Loralie Champagne 05/17/2016

## 2016-05-22 ENCOUNTER — Telehealth (HOSPITAL_COMMUNITY): Payer: Self-pay | Admitting: *Deleted

## 2016-05-22 NOTE — Telephone Encounter (Signed)
Pre cert sent 0/04/47 on website.  Clinical information faxed 05/16/16.  Today [employer] called requesting [place] to fax clinical information I stated I faxed everything on 05/16/16 but would refax.  I asked for the correct fax number  [phone] (which is the original # I sent the fax to).  Fax sent and confirmation received.  Still awaiting approval through insurance.

## 2016-05-22 NOTE — Telephone Encounter (Signed)
Approval #[id number] valid from 4/30-7/29

## 2016-05-24 ENCOUNTER — Encounter (HOSPITAL_COMMUNITY)
Admission: RE | Disposition: A | Discharge status: Home / Self Care | Payer: Self-pay | Source: Ambulatory Visit | Attending: Cardiology

## 2016-05-24 ENCOUNTER — Encounter (HOSPITAL_COMMUNITY): Payer: Self-pay | Admitting: Cardiology

## 2016-05-24 ENCOUNTER — Ambulatory Visit (HOSPITAL_COMMUNITY)
Admission: RE | Admit: 2016-05-24 | Discharge: 2016-05-24 | Disposition: A | Discharge status: Home / Self Care | Payer: Medicare HMO | Source: Ambulatory Visit | Attending: Cardiology | Admitting: Cardiology

## 2016-05-24 DIAGNOSIS — N183 Chronic kidney disease, stage 3 (moderate): Secondary | ICD-10-CM | POA: Insufficient documentation

## 2016-05-24 DIAGNOSIS — I270 Primary pulmonary hypertension: Secondary | ICD-10-CM

## 2016-05-24 DIAGNOSIS — I272 Pulmonary hypertension, unspecified: Secondary | ICD-10-CM | POA: Diagnosis not present

## 2016-05-24 DIAGNOSIS — J449 Chronic obstructive pulmonary disease, unspecified: Secondary | ICD-10-CM | POA: Diagnosis not present

## 2016-05-24 DIAGNOSIS — E785 Hyperlipidemia, unspecified: Secondary | ICD-10-CM | POA: Diagnosis not present

## 2016-05-24 DIAGNOSIS — I5032 Chronic diastolic (congestive) heart failure: Secondary | ICD-10-CM | POA: Diagnosis not present

## 2016-05-24 DIAGNOSIS — I2510 Atherosclerotic heart disease of native coronary artery without angina pectoris: Secondary | ICD-10-CM | POA: Insufficient documentation

## 2016-05-24 DIAGNOSIS — Z794 Long term (current) use of insulin: Secondary | ICD-10-CM | POA: Diagnosis not present

## 2016-05-24 DIAGNOSIS — E1122 Type 2 diabetes mellitus with diabetic chronic kidney disease: Secondary | ICD-10-CM | POA: Diagnosis not present

## 2016-05-24 DIAGNOSIS — I130 Hypertensive heart and chronic kidney disease with heart failure and stage 1 through stage 4 chronic kidney disease, or unspecified chronic kidney disease: Secondary | ICD-10-CM | POA: Diagnosis not present

## 2016-05-24 DIAGNOSIS — I509 Heart failure, unspecified: Secondary | ICD-10-CM | POA: Diagnosis not present

## 2016-05-24 DIAGNOSIS — Z8774 Personal history of (corrected) congenital malformations of heart and circulatory system: Secondary | ICD-10-CM | POA: Insufficient documentation

## 2016-05-24 DIAGNOSIS — Z7982 Long term (current) use of aspirin: Secondary | ICD-10-CM | POA: Insufficient documentation

## 2016-05-24 HISTORY — PX: RIGHT/LEFT HEART CATH AND CORONARY ANGIOGRAPHY: CATH118266

## 2016-05-24 LAB — POCT I-STAT 3, VENOUS BLOOD GAS (G3P V)
ACID-BASE EXCESS: 1 mmol/L (ref 0.0–2.0)
ACID-BASE EXCESS: 1 mmol/L (ref 0.0–2.0)
BICARBONATE: 26.7 mmol/L (ref 20.0–28.0)
BICARBONATE: 26.4 mmol/L (ref 20.0–28.0)
Bicarbonate: 25.5 mmol/L (ref 20.0–28.0)
O2 SAT: 66 %
O2 SAT: 66 %
O2 Saturation: 68 %
PCO2 VEN: 45 mmHg (ref 44.0–60.0)
PH VEN: 7.361 (ref 7.250–7.430)
PO2 VEN: 37 mmHg (ref 32.0–45.0)
PO2 VEN: 35 mmHg (ref 32.0–45.0)
TCO2: 28 mmol/L (ref 0–100)
TCO2: 27 mmol/L (ref 0–100)
TCO2: 28 mmol/L (ref 0–100)
pCO2, Ven: 47.2 mmHg (ref 44.0–60.0)
pCO2, Ven: 44.6 mmHg (ref 44.0–60.0)
pH, Ven: 7.363 (ref 7.250–7.430)
pH, Ven: 7.38 (ref 7.250–7.430)
pO2, Ven: 36 mmHg (ref 32.0–45.0)

## 2016-05-24 LAB — POCT I-STAT, CHEM 8
BUN: 28 mg/dL — AB (ref 6–20)
CALCIUM ION: 1.17 mmol/L (ref 1.15–1.40)
CREATININE: 1.6 mg/dL — AB (ref 0.44–1.00)
Chloride: 103 mmol/L (ref 101–111)
Glucose, Bld: 232 mg/dL — ABNORMAL HIGH (ref 65–99)
HCT: 33 % — ABNORMAL LOW (ref 36.0–46.0)
Hemoglobin: 11.2 g/dL — ABNORMAL LOW (ref 12.0–15.0)
Potassium: 4.1 mmol/L (ref 3.5–5.1)
SODIUM: 140 mmol/L (ref 135–145)
TCO2: 27 mmol/L (ref 0–100)

## 2016-05-24 LAB — PROTIME-INR
INR: 1
Prothrombin Time: 13.2 seconds (ref 11.4–15.2)

## 2016-05-24 LAB — GLUCOSE, CAPILLARY: Glucose-Capillary: 230 mg/dL — ABNORMAL HIGH (ref 65–99)

## 2016-05-24 SURGERY — RIGHT/LEFT HEART CATH AND CORONARY ANGIOGRAPHY
Anesthesia: LOCAL

## 2016-05-24 MED ORDER — ONDANSETRON HCL 4 MG/2ML IJ SOLN: 4.0000 mg | Freq: Four times a day (QID) | INTRAMUSCULAR | Status: DC | PRN

## 2016-05-24 MED ORDER — HEPARIN (PORCINE) IN NACL 2-0.9 UNIT/ML-% IJ SOLN
INTRAMUSCULAR | Status: DC | PRN
  Administered 2016-05-24: 1000 mL

## 2016-05-24 MED ORDER — VERAPAMIL HCL 2.5 MG/ML IV SOLN
INTRAVENOUS | Status: DC | PRN
  Administered 2016-05-24: 10 mL via INTRA_ARTERIAL

## 2016-05-24 MED ORDER — MIDAZOLAM HCL 2 MG/2ML IJ SOLN
INTRAMUSCULAR | Status: DC | PRN
  Administered 2016-05-24: 1 mg via INTRAVENOUS

## 2016-05-24 MED ORDER — SODIUM CHLORIDE 0.9% FLUSH: 3.0000 mL | INTRAVENOUS | Status: DC | PRN

## 2016-05-24 MED ORDER — IOPAMIDOL (ISOVUE-370) INJECTION 76%
INTRAVENOUS | Status: AC
  Filled 2016-05-24: qty 100

## 2016-05-24 MED ORDER — HEPARIN SODIUM (PORCINE) 1000 UNIT/ML IJ SOLN
INTRAMUSCULAR | Status: DC | PRN
  Administered 2016-05-24: 4000 [IU] via INTRAVENOUS

## 2016-05-24 MED ORDER — HEPARIN (PORCINE) IN NACL 2-0.9 UNIT/ML-% IJ SOLN
INTRAMUSCULAR | Status: AC
  Filled 2016-05-24: qty 1000

## 2016-05-24 MED ORDER — SODIUM CHLORIDE 0.9 % IV SOLN: 250.0000 mL | INTRAVENOUS | Status: DC | PRN

## 2016-05-24 MED ORDER — HEPARIN SODIUM (PORCINE) 1000 UNIT/ML IJ SOLN
INTRAMUSCULAR | Status: AC
  Filled 2016-05-24: qty 1

## 2016-05-24 MED ORDER — LIDOCAINE HCL (PF) 1 % IJ SOLN
INTRAMUSCULAR | Status: DC | PRN
  Administered 2016-05-24 (×2): 2 mL

## 2016-05-24 MED ORDER — FENTANYL CITRATE (PF) 100 MCG/2ML IJ SOLN
INTRAMUSCULAR | Status: AC
  Filled 2016-05-24: qty 2

## 2016-05-24 MED ORDER — SODIUM CHLORIDE 0.9 % IV SOLN
INTRAVENOUS | Status: DC
  Administered 2016-05-24: 10:00:00 via INTRAVENOUS

## 2016-05-24 MED ORDER — LIDOCAINE HCL 1 % IJ SOLN
INTRAMUSCULAR | Status: AC
  Filled 2016-05-24: qty 20

## 2016-05-24 MED ORDER — MIDAZOLAM HCL 2 MG/2ML IJ SOLN
INTRAMUSCULAR | Status: AC
  Filled 2016-05-24: qty 2

## 2016-05-24 MED ORDER — SODIUM CHLORIDE 0.9% FLUSH: 3.0000 mL | Freq: Two times a day (BID) | INTRAVENOUS | Status: DC

## 2016-05-24 MED ORDER — SODIUM CHLORIDE 0.9 % WEIGHT BASED INFUSION: 1.0000 mL/kg/h | INTRAVENOUS | Status: DC

## 2016-05-24 MED ORDER — IOPAMIDOL (ISOVUE-370) INJECTION 76%
INTRAVENOUS | Status: DC | PRN
  Administered 2016-05-24: 60 mL via INTRA_ARTERIAL

## 2016-05-24 MED ORDER — VERAPAMIL HCL 2.5 MG/ML IV SOLN
INTRAVENOUS | Status: AC
  Filled 2016-05-24: qty 2

## 2016-05-24 MED ORDER — FENTANYL CITRATE (PF) 100 MCG/2ML IJ SOLN
INTRAMUSCULAR | Status: DC | PRN
  Administered 2016-05-24: 25 ug via INTRAVENOUS

## 2016-05-24 MED ORDER — ACETAMINOPHEN 325 MG PO TABS: 650.0000 mg | ORAL_TABLET | ORAL | Status: DC | PRN

## 2016-05-24 SURGICAL SUPPLY — 15 items
CATH 5FR JL3.5 JR4 ANG PIG MP (CATHETERS) ×1 IMPLANT
CATH BALLN WEDGE 5F 110CM (CATHETERS) ×1 IMPLANT
GLIDESHEATH SLEND SS 6F .021 (SHEATH) ×1 IMPLANT
GUIDEWIRE INQWIRE 1.5J.035X260 (WIRE) IMPLANT
INQWIRE 1.5J .035X260CM (WIRE) ×2
KIT HEART LEFT (KITS) ×1 IMPLANT
PACK CARDIAC CATHETERIZATION (CUSTOM PROCEDURE TRAY) ×2 IMPLANT
PROTECTION STATION PRESSURIZED (MISCELLANEOUS) ×2
SHEATH GLIDE SLENDER 4/5FR (SHEATH) ×1 IMPLANT
STATION PROTECTION PRESSURIZED (MISCELLANEOUS) IMPLANT
TRANSDUCER W/STOPCOCK (MISCELLANEOUS) ×2 IMPLANT
TUBING ART PRESS 72  MALE/FEM (TUBING) ×1
TUBING ART PRESS 72 MALE/FEM (TUBING) IMPLANT
TUBING CIL FLEX 10 FLL-RA (TUBING) ×1 IMPLANT
WIRE HI TORQ VERSACORE-J 145CM (WIRE) ×1 IMPLANT

## 2016-05-24 NOTE — H&P (View-Only) (Signed)
PCP: [name] Cardiology: Dr. Harl Bowie HF Cardiology: Dr. Aundra Dubin  76 yo with history of CKD stage 3, HTN, COPD, ASD repair, and chronic diastolic CHF was referred by Dr. Harl Bowie for evaluation of CHF and worsening dyspnea.   Patient was admitted in 3/18 with volume overload/diastolic CHF and was diuresed.  She developed AKI.  She was eventually discharged on Lasix 40 mg daily (had been on this prior to admission).  She has had one earlier CHF hospitalization back in 2014.    Currently, she is short of breath walking 50-100 feet (has to stop).  She cannot climb stairs due to dyspnea.  Occasional palpitations, nothing prolonged.  No chest pain.  She has had orthopnea x years (sleeps on 4 pillows).  She has significant peripheral edema.    Labs (3/18): K 4.6, creatinine 1.96  ECG (3/18, personally reviewed): NSR versus ectopic atrial rhythm with IVCD.   PMH: 1. CKD stage 3.  2. HTN 3. Type II diabetes 4. Hyperlipidemia 5. ASD repair:  Surgical by Dr. Servando Snare in 1993.  6. h/o shingles 7. COPD 8. Chronic diastolic CHF:  - Echo (4/70) with EF 55-60%, moderate LVH, grade II diastolic dysfunction, RV moderately dilated and moderately dysfunctional, PASP 49 mmHg.   SH: Lives in [place], nonsmoker, no ETOH.    Family History  Problem Relation Age of Onset  . Cancer Father   . Diabetes Father   . Cancer Brother     Brain  . Cancer Sister     abdominal fat   ROS: All systems reviewed and negative except as per HPI.   Current Outpatient Prescriptions  Medication Sig Dispense Refill  . acetaminophen (TYLENOL) 500 MG tablet Take 500 mg by mouth every 6 (six) hours as needed for pain.    Marland Kitchen amLODipine (NORVASC) 10 MG tablet Take 1 tablet (10 mg total) by mouth daily. 90 tablet 3  . aspirin EC 81 MG tablet Take 81 mg by mouth daily.    . Blood Glucose Monitoring Suppl (ONE TOUCH ULTRA SYSTEM KIT) w/Device KIT 1 meter 1 each 0  . calcitRIOL (ROCALTROL) 0.25 MCG capsule Take 0.25 mcg  by mouth every Monday, Wednesday, and Friday.     . Cholecalciferol (VITAMIN D) 2000 UNITS CAPS Take 1 capsule by mouth daily.    . cloNIDine (CATAPRES) 0.2 MG tablet TAKE ONE TABLET BY MOUTH TWICE DAILY 60 tablet 3  . furosemide (LASIX) 40 MG tablet Take 1 tablet (40 mg total) by mouth daily. 90 tablet 3  . gabapentin (NEURONTIN) 100 MG capsule Take 1 -2 at night for peripheral neuropathy 60 capsule 3  . glucose blood test strip 1 each by Other route 4 (four) times daily -  before meals and at bedtime. Use as instructed 150 each 12  . glucose blood test strip Use as directed. Check blood sugar 3 times daily before meals and at bedtime. 100 each 12  . insulin aspart (NOVOLOG) 100 UNIT/ML FlexPen Take 7 units with luch and supper.  Inject 15 minutes prior to meal or immediately after meal. 15 mL 11  . Lancets (ONETOUCH ULTRASOFT) lancets Use as instructed 100 each 12  . LEVEMIR FLEXTOUCH 100 UNIT/ML Pen INJECT 22 UNITS SUBCUTANEOUSLY ONCE DAILY AT  10  PM 7 pen 2  . omeprazole (PRILOSEC) 20 MG capsule TAKE ONE CAPSULE BY MOUTH ONCE DAILY 90 capsule 3  . polyethylene glycol (MIRALAX / GLYCOLAX) packet Take 17 g by mouth daily. 14 each 0  . psyllium (  HYDROCIL/METAMUCIL) 95 % PACK Take 1 packet by mouth 3 (three) times daily. 56 each 0  . RELION SHORT PEN NEEDLES 31G X 8 MM MISC USE ONE PEN NEEDLE ONCE DAILY AS DIRECTED 50 each 11  . simvastatin (ZOCOR) 20 MG tablet TAKE ONE TABLET BY MOUTH ONCE DAILY AT BEDTIME 90 tablet 3   No current facility-administered medications for this encounter.    BP (!) 146/61   Pulse 61   Wt 177 lb 12 oz (80.6 kg)   SpO2 97%   BMI 31.49 kg/m  General: NAD Neck: JVP 10 cm with HJR, no thyromegaly or thyroid nodule.  Lungs: Crackles at bases bilaterally CV: Nondisplaced PMI.  Heart regular S1/S2, no S3/S4, 2/6 SEM RUSB with clear S2.  1+ ankle edema.  No carotid bruit.  Normal pedal pulses.  Abdomen: Soft, nontender, no hepatosplenomegaly, no distention.  Skin:  Intact without lesions or rashes.  Neurologic: Alert and oriented x 3.  Psych: Normal affect. Extremities: No clubbing or cyanosis.  HEENT: Normal.   Assessment/Plan: 1. Chronic diastolic CHF: Echo in 3/14 with moderate LVH, grade II diastolic dysfunction, normal EF, PASP 49 mmHg with RV dysfunction.  Her main problem may indeed be diastolic CHF.  However, need to consider possibility of the development of pulmonary arterial hypertension given late closure of ASD (in her 76s) which may have allowed irreversible pulmonary vascular changes. On exam, she appears volume overloaded.  NYHA class III symptoms.  - Increase Lasix to 40 mg bid x 3 days, then 40 qam/20 qpm.  BMET/BNP today and repeat BMET in 10 days.  - Needs RHC to define diastolic CHF/pulmonary venous hypertension versus pulmonary arterial hypertension from pulmonary vascular remodeling with late ASD closure versus combination.  PAH could potentially be treated with selective pulmonary vasodilators.  I discussed risks/benefits with her and she agrees to the procedure.  - Given significant exertional dyspnea, I think some form of ischemic evaluation is reasonable.  If creatinine today is improved compared to rior, I will plan on coronary angiography along with RHC.  If not, will arrange for Lexiscan Cardiolite.  2. CKD: Stage 3, follow carefully with diuresis.  3. H/o ASD repair: Surgical closure in her 66s.   Followup in 3 wks.   Loralie Champagne 05/17/2016

## 2016-05-24 NOTE — Interval H&P Note (Signed)
History and Physical Interval Note:  05/24/2016 10:12 AM  Laura Best  has presented today for surgery, with the diagnosis of hp  The various methods of treatment have been discussed with the patient and family. After consideration of risks, benefits and other options for treatment, the patient has consented to  Procedure(s): Right Heart Cath (N/A) as a surgical intervention .  The patient's history has been reviewed, patient examined, no change in status, stable for surgery.  I have reviewed the patient's chart and labs.  Questions were answered to the patient's satisfaction.     Laura Best

## 2016-05-24 NOTE — Discharge Instructions (Signed)
Radial Site Care °Refer to this sheet in the next few weeks. These instructions provide you with information about caring for yourself after your procedure. Your health care provider may also give you more specific instructions. Your treatment has been planned according to current medical practices, but problems sometimes occur. Call your health care provider if you have any problems or questions after your procedure. °What can I expect after the procedure? °After your procedure, it is typical to have the following: °· Bruising at the radial site that usually fades within 1-2 weeks. °· Blood collecting in the tissue (hematoma) that may be painful to the touch. It should usually decrease in size and tenderness within 1-2 weeks. °Follow these instructions at home: °· Take medicines only as directed by your health care provider. °· You may shower 24-48 hours after the procedure or as directed by your health care provider. Remove the bandage (dressing) and gently wash the site with plain soap and water. Pat the area dry with a clean towel. Do not rub the site, because this may cause bleeding. °· Do not take baths, swim, or use a hot tub until your health care provider approves. °· Check your insertion site every day for redness, swelling, or drainage. °· Do not apply powder or lotion to the site. °· Do not flex or bend the affected arm for 24 hours or as directed by your health care provider. °· Do not push or pull heavy objects with the affected arm for 24 hours or as directed by your health care provider. °· Do not lift over 10 lb (4.5 kg) for 5 days after your procedure or as directed by your health care provider. °· Ask your health care provider when it is okay to: °¨ Return to work or school. °¨ Resume usual physical activities or sports. °¨ Resume sexual activity. °· Do not drive home if you are discharged the same day as the procedure. Have someone else drive you. °· You may drive 24 hours after the procedure  unless otherwise instructed by your health care provider. °· Do not operate machinery or power tools for 24 hours after the procedure. °· If your procedure was done as an outpatient procedure, which means that you went home the same day as your procedure, a responsible adult should be with you for the first 24 hours after you arrive home. °· Keep all follow-up visits as directed by your health care provider. This is important. °Contact a health care provider if: °· You have a fever. °· You have chills. °· You have increased bleeding from the radial site. Hold pressure on the site. CALL 911 °Get help right away if: °· You have unusual pain at the radial site. °· You have redness, warmth, or swelling at the radial site. °· You have drainage (other than a small amount of blood on the dressing) from the radial site. °· The radial site is bleeding, and the bleeding does not stop after 30 minutes of holding steady pressure on the site. °· Your arm or hand becomes pale, cool, tingly, or numb. °This information is not intended to replace advice given to you by your health care provider. Make sure you discuss any questions you have with your health care provider. °Document Released: 02/11/2010 Document Revised: 06/17/2015 Document Reviewed: 07/28/2013 °Elsevier Interactive Patient Education © 2017 [employer] ° ° °

## 2016-05-26 ENCOUNTER — Inpatient Hospital Stay (HOSPITAL_COMMUNITY): Admission: RE | Admit: 2016-05-26 | Payer: Medicare HMO | Source: Ambulatory Visit

## 2016-05-26 DIAGNOSIS — R69 Illness, unspecified: Secondary | ICD-10-CM | POA: Diagnosis not present

## 2016-05-29 ENCOUNTER — Ambulatory Visit (HOSPITAL_COMMUNITY)
Admission: RE | Admit: 2016-05-29 | Discharge: 2016-05-29 | Disposition: A | Discharge status: Home / Self Care | Payer: Medicare HMO | Source: Ambulatory Visit | Attending: Internal Medicine | Admitting: Internal Medicine

## 2016-05-29 DIAGNOSIS — I5032 Chronic diastolic (congestive) heart failure: Secondary | ICD-10-CM | POA: Diagnosis not present

## 2016-05-29 LAB — BASIC METABOLIC PANEL
ANION GAP: 9 (ref 5–15)
BUN: 31 mg/dL — ABNORMAL HIGH (ref 6–20)
CALCIUM: 9.3 mg/dL (ref 8.9–10.3)
CO2: 26 mmol/L (ref 22–32)
Chloride: 106 mmol/L (ref 101–111)
Creatinine, Ser: 1.83 mg/dL — ABNORMAL HIGH (ref 0.44–1.00)
GFR, EST AFRICAN AMERICAN: 30 mL/min — AB (ref >60)
GFR, EST NON AFRICAN AMERICAN: 26 mL/min — AB (ref >60)
GLUCOSE: 176 mg/dL — AB (ref 65–99)
POTASSIUM: 4.6 mmol/L (ref 3.5–5.1)
SODIUM: 141 mmol/L (ref 135–145)

## 2016-06-06 ENCOUNTER — Ambulatory Visit (HOSPITAL_COMMUNITY)
Admission: RE | Admit: 2016-06-06 | Discharge: 2016-06-06 | Disposition: A | Discharge status: Home / Self Care | Payer: Medicare HMO | Source: Ambulatory Visit | Attending: Cardiology | Admitting: Cardiology

## 2016-06-06 ENCOUNTER — Encounter (HOSPITAL_COMMUNITY): Payer: Self-pay

## 2016-06-06 VITALS — BP 141/64 | HR 66 | Wt 175.5 lb

## 2016-06-06 DIAGNOSIS — I5033 Acute on chronic diastolic (congestive) heart failure: Secondary | ICD-10-CM | POA: Diagnosis not present

## 2016-06-06 DIAGNOSIS — E1122 Type 2 diabetes mellitus with diabetic chronic kidney disease: Secondary | ICD-10-CM | POA: Insufficient documentation

## 2016-06-06 DIAGNOSIS — Z809 Family history of malignant neoplasm, unspecified: Secondary | ICD-10-CM | POA: Insufficient documentation

## 2016-06-06 DIAGNOSIS — N183 Chronic kidney disease, stage 3 (moderate): Secondary | ICD-10-CM | POA: Diagnosis not present

## 2016-06-06 DIAGNOSIS — Z800 Family history of malignant neoplasm of digestive organs: Secondary | ICD-10-CM | POA: Insufficient documentation

## 2016-06-06 DIAGNOSIS — Z7982 Long term (current) use of aspirin: Secondary | ICD-10-CM | POA: Diagnosis not present

## 2016-06-06 DIAGNOSIS — J449 Chronic obstructive pulmonary disease, unspecified: Secondary | ICD-10-CM | POA: Diagnosis not present

## 2016-06-06 DIAGNOSIS — Z833 Family history of diabetes mellitus: Secondary | ICD-10-CM | POA: Diagnosis not present

## 2016-06-06 DIAGNOSIS — I272 Pulmonary hypertension, unspecified: Secondary | ICD-10-CM | POA: Diagnosis not present

## 2016-06-06 DIAGNOSIS — I270 Primary pulmonary hypertension: Secondary | ICD-10-CM

## 2016-06-06 DIAGNOSIS — I2510 Atherosclerotic heart disease of native coronary artery without angina pectoris: Secondary | ICD-10-CM | POA: Diagnosis not present

## 2016-06-06 DIAGNOSIS — I130 Hypertensive heart and chronic kidney disease with heart failure and stage 1 through stage 4 chronic kidney disease, or unspecified chronic kidney disease: Secondary | ICD-10-CM | POA: Insufficient documentation

## 2016-06-06 DIAGNOSIS — E785 Hyperlipidemia, unspecified: Secondary | ICD-10-CM | POA: Insufficient documentation

## 2016-06-06 DIAGNOSIS — I5032 Chronic diastolic (congestive) heart failure: Secondary | ICD-10-CM | POA: Insufficient documentation

## 2016-06-06 DIAGNOSIS — Z794 Long term (current) use of insulin: Secondary | ICD-10-CM | POA: Diagnosis not present

## 2016-06-06 DIAGNOSIS — Z808 Family history of malignant neoplasm of other organs or systems: Secondary | ICD-10-CM | POA: Insufficient documentation

## 2016-06-06 DIAGNOSIS — Z8619 Personal history of other infectious and parasitic diseases: Secondary | ICD-10-CM | POA: Diagnosis not present

## 2016-06-06 LAB — BASIC METABOLIC PANEL
ANION GAP: 8 (ref 5–15)
BUN: 25 mg/dL — ABNORMAL HIGH (ref 6–20)
CHLORIDE: 105 mmol/L (ref 101–111)
CO2: 26 mmol/L (ref 22–32)
CREATININE: 1.77 mg/dL — AB (ref 0.44–1.00)
Calcium: 9.1 mg/dL (ref 8.9–10.3)
GFR calc non Af Amer: 27 mL/min — ABNORMAL LOW (ref >60)
GFR, EST AFRICAN AMERICAN: 31 mL/min — AB (ref >60)
Glucose, Bld: 133 mg/dL — ABNORMAL HIGH (ref 65–99)
POTASSIUM: 3.9 mmol/L (ref 3.5–5.1)
Sodium: 139 mmol/L (ref 135–145)

## 2016-06-06 MED ORDER — CARVEDILOL 6.25 MG PO TABS: 6.2500 mg | ORAL_TABLET | Freq: Two times a day (BID) | ORAL | 3 refills | Status: DC

## 2016-06-06 NOTE — Progress Notes (Signed)
PCP: [name] Cardiology: Dr. Harl Bowie HF Cardiology: Dr. Aundra Dubin  76 yo with history of CKD stage 3, HTN, COPD, ASD repair, and chronic diastolic CHF was referred by Dr. Harl Bowie for evaluation of CHF and worsening dyspnea.   Patient was admitted in 3/18 with volume overload/diastolic CHF and was diuresed.  She developed AKI.  She was eventually discharged on Lasix 40 mg daily (had been on this prior to admission).  She has had one earlier CHF hospitalization back in 2014.    I did a right and left heart cath in 5/18 that showed very mildly elevated PCWP and mild pulmonary venous hypertension.  There was nonobstructive CAD.   She returns today for follow up.  Feels well overall, weights at home 169-172 pounds. No SOB with walking into clinic, has not been SOB with house work. Taking all medications with compliance. Eating some high salt foods, but tries to watch her salt intake. Drinking less than 2L a day.  Labs (3/18): K 4.6, creatinine 1.96 Labs (5/18): K 4.6, creatinine 1.8  PMH: 1. CKD stage 3.  2. HTN 3. Type II diabetes 4. Hyperlipidemia 5. ASD repair:  Surgical by Dr. Servando Snare in 1993.  6. h/o shingles 7. COPD 8. Chronic diastolic CHF:  - Echo (7/32) with EF 55-60%, moderate LVH, grade II diastolic dysfunction, RV moderately dilated and moderately dysfunctional, PASP 49 mmHg.  - L/RHC 05/2016: mean RA 6, PA 48/18 mean 28, mean PCWP 16, CI 3.24, PVR 2.0 WU. No step up in oxygen saturation to suggest left to right shunt.  Nonobstructive CAD with serial 50% stenoses in proximal and mid LCx.   SH: Lives in [place], nonsmoker, no ETOH.    Family History  Problem Relation Age of Onset  . Cancer Father   . Diabetes Father   . Cancer Brother        Brain  . Cancer Sister        abdominal fat   ROS: All systems reviewed and negative except as per HPI.   Current Outpatient Prescriptions  Medication Sig Dispense Refill  . acetaminophen (TYLENOL) 500 MG tablet Take 500 mg  by mouth every 6 (six) hours as needed for pain.    Marland Kitchen amLODipine (NORVASC) 10 MG tablet Take 1 tablet (10 mg total) by mouth daily. 90 tablet 3  . aspirin EC 81 MG tablet Take 81 mg by mouth daily.    . Blood Glucose Monitoring Suppl (ONE TOUCH ULTRA SYSTEM KIT) w/Device KIT 1 meter 1 each 0  . calcitRIOL (ROCALTROL) 0.25 MCG capsule Take 0.25 mcg by mouth every Monday, Wednesday, and Friday.     . Cholecalciferol (VITAMIN D) 2000 UNITS CAPS Take 2,000 Units by mouth daily.     . cloNIDine (CATAPRES) 0.2 MG tablet TAKE ONE TABLET BY MOUTH TWICE DAILY 60 tablet 3  . furosemide (LASIX) 40 MG tablet Take 1 tablet (40 mg total) by mouth daily. 90 tablet 3  . gabapentin (NEURONTIN) 100 MG capsule Take 1 -2 at night for peripheral neuropathy 60 capsule 3  . glucose blood test strip 1 each by Other route 4 (four) times daily -  before meals and at bedtime. Use as instructed 150 each 12  . glucose blood test strip Use as directed. Check blood sugar 3 times daily before meals and at bedtime. 100 each 12  . insulin aspart (NOVOLOG) 100 UNIT/ML FlexPen Take 7 units with luch and supper.  Inject 15 minutes prior to meal or immediately after  meal. 15 mL 11  . Lancets (ONETOUCH ULTRASOFT) lancets Use as instructed 100 each 12  . LEVEMIR FLEXTOUCH 100 UNIT/ML Pen INJECT 22 UNITS SUBCUTANEOUSLY ONCE DAILY AT  10  PM 7 pen 2  . omeprazole (PRILOSEC) 20 MG capsule TAKE ONE CAPSULE BY MOUTH ONCE DAILY 90 capsule 3  . polyethylene glycol (MIRALAX / GLYCOLAX) packet Take 17 g by mouth daily. 14 each 0  . psyllium (HYDROCIL/METAMUCIL) 95 % PACK Take 1 packet by mouth 3 (three) times daily. 56 each 0  . RELION SHORT PEN NEEDLES 31G X 8 MM MISC USE ONE PEN NEEDLE ONCE DAILY AS DIRECTED 50 each 11  . simvastatin (ZOCOR) 20 MG tablet TAKE ONE TABLET BY MOUTH ONCE DAILY AT BEDTIME 90 tablet 3   No current facility-administered medications for this encounter.    BP (!) 141/64   Pulse 66   Wt 175 lb 8 oz (79.6 kg)    SpO2 97%   BMI 31.09 kg/m  General: NAD, walked into clinic without difficulty.  Neck: no JVP  no thyromegaly or thyroid nodule.  Lungs: Clear to auscultation bilaterally. No wheeze. Normal effort.  CV: Nondisplaced PMI.  Heart regular S1/S2, no S3/S4, 2/2 SEM at RUSB, No edema. No carotid bruit. Normal pedal pulses.  Abdomen: Soft, nontender, no hepatosplenomegaly, no distention.  Skin: Intact without lesions or rashes.  Neurologic: Alert and oriented x 3.  Psych: Normal affect. Extremities: No clubbing or cyanosis.  HEENT: Normal.   Assessment/Plan: 1. Chronic diastolic CHF: Echo in 9/79 with moderate LVH, grade II diastolic dysfunction, normal EF, PASP 49 mmHg with RV dysfunction.   Right and left heart cath done on 05/24/16, low PVR consistent with pulmonary venous HTN and mildly elevated PCWP. NYHA class II, volume status looks ok.  - Continue Lasix 11m daily.  - Encouraged her to continue daily weights and 2L fluid restriction.  2. CKD stage III: Baseline creatinine 1.6-1.8, stable.  3. HTN: BP elevated today, start Coreg 6.25 mg BID.  4. H/o ASD repair: Surgical closure in her 571s No evidence for significant left to right shunt on RHC in 5/18.  5. CAD: Nonobstructive.  Continue statin and ASA 81 daily.   Follow up in 3 months.   EArbutus Leas5/15/2018   Patient seen with NP, agree with the above note.  She is stable post-cath.  Volume status is controlled on Lasix 40 mg daily.  Mild HTN, increase Coreg to 6.25 mg bid.   DLoralie Champagne5/17/2018

## 2016-06-06 NOTE — Patient Instructions (Signed)
Start Carvedilol 6.25 mg Twice daily   Labs today  Your physician recommends that you schedule a follow-up appointment in: 3 months

## 2016-06-08 ENCOUNTER — Other Ambulatory Visit: Payer: Self-pay | Admitting: Physician Assistant

## 2016-06-08 NOTE — Telephone Encounter (Signed)
Refill appropriate 

## 2016-06-21 DIAGNOSIS — R69 Illness, unspecified: Secondary | ICD-10-CM | POA: Diagnosis not present

## 2016-07-11 ENCOUNTER — Other Ambulatory Visit: Payer: Self-pay | Admitting: Adult Health

## 2016-07-11 DIAGNOSIS — R69 Illness, unspecified: Secondary | ICD-10-CM | POA: Diagnosis not present

## 2016-07-12 DIAGNOSIS — R69 Illness, unspecified: Secondary | ICD-10-CM | POA: Diagnosis not present

## 2016-07-14 DIAGNOSIS — N183 Chronic kidney disease, stage 3 (moderate): Secondary | ICD-10-CM | POA: Diagnosis not present

## 2016-07-14 DIAGNOSIS — R809 Proteinuria, unspecified: Secondary | ICD-10-CM | POA: Diagnosis not present

## 2016-07-14 DIAGNOSIS — I10 Essential (primary) hypertension: Secondary | ICD-10-CM | POA: Diagnosis not present

## 2016-07-14 DIAGNOSIS — Z79899 Other long term (current) drug therapy: Secondary | ICD-10-CM | POA: Diagnosis not present

## 2016-07-14 DIAGNOSIS — D509 Iron deficiency anemia, unspecified: Secondary | ICD-10-CM | POA: Diagnosis not present

## 2016-07-14 DIAGNOSIS — E559 Vitamin D deficiency, unspecified: Secondary | ICD-10-CM | POA: Diagnosis not present

## 2016-07-16 DIAGNOSIS — R69 Illness, unspecified: Secondary | ICD-10-CM | POA: Diagnosis not present

## 2016-07-18 DIAGNOSIS — N184 Chronic kidney disease, stage 4 (severe): Secondary | ICD-10-CM | POA: Diagnosis not present

## 2016-07-18 DIAGNOSIS — I10 Essential (primary) hypertension: Secondary | ICD-10-CM | POA: Diagnosis not present

## 2016-07-18 DIAGNOSIS — R809 Proteinuria, unspecified: Secondary | ICD-10-CM | POA: Diagnosis not present

## 2016-07-18 DIAGNOSIS — E1129 Type 2 diabetes mellitus with other diabetic kidney complication: Secondary | ICD-10-CM | POA: Diagnosis not present

## 2016-07-19 ENCOUNTER — Ambulatory Visit: Payer: Medicare HMO | Admitting: Cardiology

## 2016-08-10 ENCOUNTER — Other Ambulatory Visit: Payer: Self-pay | Admitting: Physician Assistant

## 2016-08-10 DIAGNOSIS — R69 Illness, unspecified: Secondary | ICD-10-CM | POA: Diagnosis not present

## 2016-08-28 ENCOUNTER — Encounter (HOSPITAL_COMMUNITY): Payer: Medicare HMO | Admitting: Cardiology

## 2016-08-28 DIAGNOSIS — M5136 Other intervertebral disc degeneration, lumbar region: Secondary | ICD-10-CM | POA: Diagnosis not present

## 2016-08-28 DIAGNOSIS — M1712 Unilateral primary osteoarthritis, left knee: Secondary | ICD-10-CM | POA: Diagnosis not present

## 2016-08-28 DIAGNOSIS — M545 Low back pain: Secondary | ICD-10-CM | POA: Diagnosis not present

## 2016-08-30 ENCOUNTER — Encounter: Payer: Medicare HMO | Admitting: Physician Assistant

## 2016-09-06 ENCOUNTER — Other Ambulatory Visit: Payer: Self-pay | Admitting: Physician Assistant

## 2016-09-06 DIAGNOSIS — R69 Illness, unspecified: Secondary | ICD-10-CM | POA: Diagnosis not present

## 2016-09-08 ENCOUNTER — Encounter (HOSPITAL_COMMUNITY): Payer: Medicare HMO | Admitting: Cardiology

## 2016-09-20 ENCOUNTER — Encounter: Payer: Self-pay | Admitting: Physician Assistant

## 2016-09-20 ENCOUNTER — Ambulatory Visit (INDEPENDENT_AMBULATORY_CARE_PROVIDER_SITE_OTHER): Payer: Medicare HMO | Admitting: Physician Assistant

## 2016-09-20 VITALS — BP 160/70 | HR 61 | Temp 98.2°F | Resp 16 | Ht 63.0 in | Wt 170.4 lb

## 2016-09-20 DIAGNOSIS — E1121 Type 2 diabetes mellitus with diabetic nephropathy: Secondary | ICD-10-CM | POA: Diagnosis not present

## 2016-09-20 DIAGNOSIS — Z1239 Encounter for other screening for malignant neoplasm of breast: Secondary | ICD-10-CM

## 2016-09-20 DIAGNOSIS — I2721 Secondary pulmonary arterial hypertension: Secondary | ICD-10-CM

## 2016-09-20 DIAGNOSIS — K219 Gastro-esophageal reflux disease without esophagitis: Secondary | ICD-10-CM | POA: Diagnosis not present

## 2016-09-20 DIAGNOSIS — I10 Essential (primary) hypertension: Secondary | ICD-10-CM | POA: Diagnosis not present

## 2016-09-20 DIAGNOSIS — M810 Age-related osteoporosis without current pathological fracture: Secondary | ICD-10-CM | POA: Diagnosis not present

## 2016-09-20 DIAGNOSIS — Z0000 Encounter for general adult medical examination without abnormal findings: Secondary | ICD-10-CM | POA: Diagnosis not present

## 2016-09-20 DIAGNOSIS — E559 Vitamin D deficiency, unspecified: Secondary | ICD-10-CM

## 2016-09-20 DIAGNOSIS — Z1231 Encounter for screening mammogram for malignant neoplasm of breast: Secondary | ICD-10-CM

## 2016-09-20 DIAGNOSIS — Z794 Long term (current) use of insulin: Secondary | ICD-10-CM

## 2016-09-20 DIAGNOSIS — N183 Chronic kidney disease, stage 3 unspecified: Secondary | ICD-10-CM

## 2016-09-20 DIAGNOSIS — E785 Hyperlipidemia, unspecified: Secondary | ICD-10-CM | POA: Diagnosis not present

## 2016-09-20 DIAGNOSIS — D638 Anemia in other chronic diseases classified elsewhere: Secondary | ICD-10-CM

## 2016-09-20 DIAGNOSIS — Z23 Encounter for immunization: Secondary | ICD-10-CM

## 2016-09-20 DIAGNOSIS — I5032 Chronic diastolic (congestive) heart failure: Secondary | ICD-10-CM | POA: Diagnosis not present

## 2016-09-20 DIAGNOSIS — Z1212 Encounter for screening for malignant neoplasm of rectum: Secondary | ICD-10-CM

## 2016-09-20 DIAGNOSIS — Z1211 Encounter for screening for malignant neoplasm of colon: Secondary | ICD-10-CM

## 2016-09-20 LAB — CBC WITH DIFFERENTIAL/PLATELET
Basophils Absolute: 68 cells/uL (ref 0–200)
Basophils Relative: 1 %
Eosinophils Absolute: 204 cells/uL (ref 15–500)
Eosinophils Relative: 3 %
HEMATOCRIT: 40.5 % (ref 35.0–45.0)
Hemoglobin: 13.1 g/dL (ref 12.0–15.0)
LYMPHS PCT: 26 %
Lymphs Abs: 1768 cells/uL (ref 850–3900)
MCH: 28.5 pg (ref 27.0–33.0)
MCHC: 32.3 g/dL (ref 32.0–36.0)
MCV: 88 fL (ref 80.0–100.0)
MONO ABS: 612 {cells}/uL (ref 200–950)
MONOS PCT: 9 %
MPV: 10.8 fL (ref 7.5–12.5)
NEUTROS PCT: 61 %
Neutro Abs: 4148 cells/uL (ref 1500–7800)
Platelets: 224 10*3/uL (ref 140–400)
RBC: 4.6 MIL/uL (ref 3.80–5.10)
RDW: 15.1 % — AB (ref 11.0–15.0)
WBC: 6.8 10*3/uL (ref 3.8–10.8)

## 2016-09-20 NOTE — Progress Notes (Signed)
Subjective:   Patient presents for Medicare Annual/Subsequent preventive examination.    She lives with her son his wife and her grandkids. Says her son is unable to get off work to take her to her appointments. Says that her sister-in-law who is 76 years old is the one who gets her to her appointments.  Review Past Medical/Family/Social: These are all documented in EPIC and reviewed today  Risk Factors  Current exercise habits: Does stretches at home and walks some at home--both inside and outside.  Dietary issues discussed: Follows Diabetic Diet  Cardiac risk factors: Sees Cardiology routinely  Depression Screen  (Note: if answer to either of the following is "Yes", a more complete depression screening is indicated)  Over the past two weeks, have you felt down, depressed or hopeless? No Over the past two weeks, have you felt little interest or pleasure in doing things? No Have you lost interest or pleasure in daily life? No Do you often feel hopeless? No Do you cry easily over simple problems? No   Activities of Daily Living  In your present state of health, do you have any difficulty performing the following activities?:  Driving? She has never driven Managing money? No  Feeding yourself? No  Getting from bed to chair? No  Climbing a flight of stairs? No  Preparing food and eating?: No  Bathing or showering? No  Getting dressed: No  Getting to the toilet? No  Using the toilet:No  Moving around from place to place: No  In the past year have you fallen or had a near fall?:No  Are you sexually active? No  Do you have more than one partner? No   Hearing Difficulties: No  Do you often ask people to speak up or repeat themselves? No  Do you experience ringing or noises in your ears? No Do you have difficulty understanding soft or whispered voices? No  Do you feel that you have a problem with memory? No Do you often misplace items? No  Do you feel safe at home?  Yes  Cognitive Testing  Alert? Yes Normal Appearance?Yes  Oriented to person? Yes Place? Yes  Time? Yes  Recall of three objects? Yes  Can perform simple calculations? Yes  Displays appropriate judgment?Yes  Can read the correct time from a watch face?Yes   List the Names of Other Physician/Practitioners you currently use:  [facility] Dr. Lonia Blood Dr. Benson Norway-- GI Indicate any recent Medical Services you may have received from other than [facility] providers in the past year (date may be approximate).   Screening Tests / Date Colonoscopy-----Dr. Benson Norway-- 08/13/2008                    Zostavax --deferred sec to cost Mammogram ---- Last was 02/25/2013 ----Discussed this at [place] 09/20/2016----she feels that given her age and comorbidities, etc---she would not go through cnacer treatments, even if it were detected early so defers mammogram Influenza Vaccine --- Will give today--Given 09/20/2016 Tetanus/tdap---12/2012    Assessment:    Annual wellness medicare exam   Plan:    During the course of the visit the patient was educated and counseled about appropriate screening and preventive services including:  Screening mammography  Colorectal cancer screening  Shingles vaccine. Prescription given to that she can get the vaccine at the pharmacy or Medicare part D.  Screen + for depression. PHQ- 9 score of 12 (moderate depression). We discussed the options of counseling versus possibly a medication. I encouraged her strongly  think about the counseling. She is going through some medical problems currently and her husband is as well Mrs. been very stressful for her. She says she will think about it. She does have Xanax to use as needed. Though she may benefit from an SSRI for her more depressive type symptoms but she wants to hold off at this time.  I aksed her to please have her cardioloist send records since we have none on file.  Diet review for nutrition referral? Yes ____ Not  Indicated __x__  Patient Instructions (the written plan) was given to the patient.  Medicare Attestation  I have personally reviewed:  The patient's medical and social history  Their use of alcohol, tobacco or illicit drugs  Their current medications and supplements  The patient's functional ability including ADLs,fall risks, home safety risks, cognitive, and hearing and visual impairment  Diet and physical activities  Evidence for depression or mood disorders  The patient's weight, height, BMI, and visual acuity have been recorded in the chart. I have made referrals, counseling, and provided education to the patient based on review of the above and I have provided the patient with a written personalized care plan for preventive services.     Review of Systems: Consitutional: No fever, chills, fatigue, night sweats, lymphadenopathy. No significant/unexplained weight changes. Eyes: No visual changes, eye redness, or discharge. ENT/Mouth: No ear pain, sore throat, nasal drainage, or sinus pain. Cardiovascular: No chest pressure,heaviness, tightness or squeezing, even with exertion. No increased shortness of breath or dyspnea on exertion.No palpitations, edema, orthopnea, PND. Respiratory: No cough, hemoptysis, SOB, or wheezing. Gastrointestinal: No anorexia, dysphagia, reflux, pain, nausea, vomiting, hematemesis, diarrhea, constipation, BRBPR, or melena. Breast: No mass, nodules, bulging, or retraction. No skin changes or inflammation. No nipple discharge. No lymphadenopathy. Genitourinary: No dysuria, hematuria, incontinence, vaginal discharge, pruritis, burning, abnormal bleeding, or pain. Musculoskeletal: No decreased ROM, No joint pain or swelling. No significant pain in neck, back, or extremities. Skin: No rash, pruritis, or concerning lesions. Neurological: No headache, dizziness, syncope, seizures, tremors, memory loss, coordination problems, or paresthesias. Psychological: No anxiety,  depression, hallucinations, SI/HI. Endocrine: No polydipsia, polyphagia, polyuria, or known diabetes.No increased fatigue. No palpitations/rapid heart rate. No significant/unexplained weight change. All other systems were reviewed and are otherwise negative.  Past Medical History:  Diagnosis Date  . Arthritis    "hands, feet" (02/17/2016)  . CHF (congestive heart failure) (HCC)   . CKD (chronic kidney disease) stage 3, GFR 30-59 ml/min 12/25/2012  . COPD (chronic obstructive pulmonary disease) (HCC)   . Diastolic heart failure (HCC)   . Hyperlipidemia   . Hypertension   . Osteoporosis   . Osteoporosis   . Pneumonia    "several times" (02/17/2016)  . Small bowel obstruction (HCC) 02/17/2016  . Tricuspid regurgitation    Echo 05/02/06-Nml LV. Mild MR. Mod TR  . Type II diabetes mellitus (HCC)   . Vitamin D deficiency      Past Surgical History:  Procedure Laterality Date  . APPENDECTOMY    . HEART CHAMBER REVISION     patched hole --1942 & 1993  . LAPAROSCOPIC APPENDECTOMY N/A 09/27/2013   Procedure: APPENDECTOMY - CONVERTED FROM LAPAROSCOPIC;  Surgeon: Manus Rudd, MD;  Location: [facility] OR;  Service: General;  Laterality: N/A;  . RIGHT/LEFT HEART CATH AND CORONARY ANGIOGRAPHY N/A 05/24/2016   Procedure: Right/Left Heart Cath and Coronary Angiography;  Surgeon: Laurey Morale, MD;  Location: [facility] INVASIVE CV LAB;  Service: Cardiovascular;  Laterality: N/A;  .  TUBAL LIGATION      Home Meds:  Outpatient Medications Prior to Visit  Medication Sig Dispense Refill  . acetaminophen (TYLENOL) 500 MG tablet Take 500 mg by mouth every 6 (six) hours as needed for pain.    Marland Kitchen amLODipine (NORVASC) 10 MG tablet TAKE ONE TABLET BY MOUTH ONCE DAILY 90 tablet 3  . aspirin EC 81 MG tablet Take 81 mg by mouth daily.    . Blood Glucose Monitoring Suppl (ONE TOUCH ULTRA SYSTEM KIT) w/Device KIT 1 meter 1 each 0  . calcitRIOL (ROCALTROL) 0.25 MCG capsule Take 0.25 mcg by mouth every Monday, Wednesday, and  Friday.     . carvedilol (COREG) 6.25 MG tablet Take 1 tablet (6.25 mg total) by mouth 2 (two) times daily. 60 tablet 3  . Cholecalciferol (VITAMIN D) 2000 UNITS CAPS Take 2,000 Units by mouth daily.     . cloNIDine (CATAPRES) 0.2 MG tablet TAKE ONE TABLET BY MOUTH TWICE DAILY 60 tablet 3  . furosemide (LASIX) 40 MG tablet TAKE 1 TABLET BY MOUTH ONCE DAILY 90 tablet 0  . gabapentin (NEURONTIN) 100 MG capsule Take 1 -2 at night for peripheral neuropathy 60 capsule 3  . glucose blood test strip 1 each by Other route 4 (four) times daily -  before meals and at bedtime. Use as instructed 150 each 12  . glucose blood test strip Use as directed. Check blood sugar 3 times daily before meals and at bedtime. 100 each 12  . insulin aspart (NOVOLOG) 100 UNIT/ML FlexPen Take 7 units with luch and supper.  Inject 15 minutes prior to meal or immediately after meal. 15 mL 11  . Lancets (ONETOUCH ULTRASOFT) lancets Use as instructed 100 each 12  . LEVEMIR FLEXTOUCH 100 UNIT/ML Pen INJECT 22 UNITS SUBCUTANOUESLY ONCE DAILY AT 10 PM 15 pen 2  . omeprazole (PRILOSEC) 20 MG capsule TAKE ONE CAPSULE BY MOUTH ONCE DAILY 90 capsule 3  . omeprazole (PRILOSEC) 20 MG capsule TAKE ONE CAPSULE BY MOUTH ONCE DAILY 90 capsule 3  . polyethylene glycol (MIRALAX / GLYCOLAX) packet Take 17 g by mouth daily. 14 each 0  . psyllium (HYDROCIL/METAMUCIL) 95 % PACK Take 1 packet by mouth 3 (three) times daily. 56 each 0  . RELION SHORT PEN NEEDLES 31G X 8 MM MISC USE ONE PEN NEEDLE ONCE DAILY AS DIRECTED 50 each 11  . simvastatin (ZOCOR) 20 MG tablet TAKE ONE TABLET BY MOUTH ONCE DAILY AT BEDTIME 90 tablet 3   No facility-administered medications prior to visit.     Allergies:  Allergies  Allergen Reactions  . Ace Inhibitors Other (See Comments)    Hyperkalemia  . Actos [Pioglitazone] Swelling  . Aspirin Other (See Comments)    G.I. Upset. In high doses  . Metformin And Related Diarrhea    Social History   Social History   . Marital status: Widowed    Spouse name: N/A  . Number of children: N/A  . Years of education: N/A   Occupational History  . Not on file.   Social History Main Topics  . Smoking status: Former Smoker    Packs/day: 0.50    Years: 50.00    Types: Cigarettes    Quit date: 01/19/2009  . Smokeless tobacco: Never Used  . Alcohol use No  . Drug use: No  . Sexual activity: No   Other Topics Concern  . Not on file   Social History Narrative   Entered 10/2013:   She has never  driven.   She lives with her son.    Family History  Problem Relation Age of Onset  . Cancer Father   . Diabetes Father   . Cancer Brother        Brain  . Cancer Sister        abdominal fat    Physical Exam: Blood pressure (!) 160/70, pulse 61, temperature 98.2 F (36.8 C), temperature source Oral, resp. rate 16, height '5\' 3"'$  (1.6 m), weight 170 lb 6.4 oz (77.3 kg), SpO2 96 %., Body mass index is 30.19 kg/m. General: Well developed, well nourished, WF. Appears in no acute distress. HEENT: Normocephalic, atraumatic. Conjunctiva pink, sclera non-icteric. Pupils 2 mm constricting to 1 mm, round, regular, and equally reactive to light and accomodation. EOMI. Internal auditory canal clear. TMs with good cone of light and without pathology. Nasal mucosa pink. Nares are without discharge. No sinus tenderness. Oral mucosa pink.  Pharynx without exudate.   Neck: Supple. Trachea midline. No thyromegaly. Full ROM. No lymphadenopathy.No Carotid Bruits. Lungs: Clear to auscultation bilaterally without wheezes, rales, or rhonchi. Breathing is of normal effort and unlabored. Cardiovascular: RRR with S1 S2. No murmurs, rubs, or gallops. Distal pulses 2+ symmetrically. No carotid or abdominal bruit. Abdomen: Soft,  non-distended. She has wound dressing in place. Clean and dry.  Musculoskeletal: Full range of motion and 5/5 strength throughout. Without swelling. Skin: Warm and moist without erythema, ecchymosis, wounds,  or rash. Diabetic Foot Exam: Inspection is normal. There are no wounds or calluses or problematic areas. 2+ DP and PT pulses bilaterally. Neuro: A+Ox3. CN II-XII grossly intact. Moves all extremities spontaneously. Full sensation throughout. Normal gait. Psych:  Responds to questions appropriately with a normal affect.   Assessment/Plan:  76 y.o. y/o female here for CPE  Medicare annual wellness visit, subsequent Visit for preventive health examination  A. Screening Labs: - CBC with Differential/Platelet - COMPLETE METABOLIC PANEL WITH GFR - Lipid panel - TSH  B. JOI:[id number]--EBR 73.  C. Screening Mammogram: Last mammogram was 02/25/2013    09/20/2016: Discussed at [place] today.  She reports that given her age and comorbidities that even if a very small cancer was detected she would not undergo treatment for it.                                                      Therefore defers mammogram. D. DEXA/BMD:  She has been on Fosamax in the past.                            Will not do further bone density test as I would not do further treatment regardless of the test results. E. Colorectal Cancer Screening:       Last colonoscopy was by Dr. Benson Norway 08/13/2008 F. Immunizations:  Influenza: 09/20/2016 Tetanus: 12/2012 Pneumococcal:  She received the Pneumovax 11/30/09. Prevnar 13 --given here 12/2012.  Zostavax: she defers.  Ophthalmology  at her CPE with me 10/30/2013 we discussed glaucoma screen and I put in referral to ophthalmology.  Also at prior visits regarding her diabetes we have discussed need for diabetic eye exam. Discussed all of this again today. She states that her son is unable to get her to appointments because of his job schedule.  She reports that her 82 year old sister in  law is the one that brought er to this appointment today and he takes her to her other appointments.  She states that her sister-in-law is unable to take her to additional appointments so defers another  referral to ophthalmology.   Essential hypertension Blood  pressure is reading slightly high today but has en cotrolled. Will ctinue current meications. Check lab to monitor. - COMPLETE METABOLIC PANEL WITH GFR  Hyperlipidemia, unspecified hyperlipidemia type  she is on statin she had something to eat at 4:30 AM but has fasted since which has been almost 7 hours. Will go ahead an check FLP LFT. - COMPLETE METABOLIC PANEL WITH GFR - Lipid panel  Age-related osteoporosis without current pathological fracture She has been on Fosamax in the past.                            Will not do further bone density test as I would not do further treatment regardless of the test results.   Chronic diastolic congestive heart failure (HCC)  managed by cardiology  [place] (pulmonary artery hypertension) ([place])  managed by cardiology  Gastroesophageal reflux disease, esophagitis presence not specified  stable with current treatment  Type 2 diabetes mellitus with diabetic nephropathy, with long-term current use of insulin ([place])  she does see nephrology. However will go ahead and che microalbumin for docmentation. Also ill check a1c to monitor. - Microalbumin, urine - Hemoglobin A1c - COMPLETE METABOLIC PANEL WITH GFR  CKD (chronic kidney disease) stage 3, GFR 30-59 ml/min She does see Dr. Lowanda Foster for nephrology. - COMPLETE METABOLIC PANEL WITH GFR  Anemia of chronic disease She does see Dr. Lowanda Foster for nephrology. - CBC with Differential/Platelet  Flu vaccine need - Flu Vaccine QUAD 6+ mos PF IM (Fluarix Quad PF)--Given 09/20/2016  She is scheduling follow-up visit in 3 months for routine follow-up of her diabetes and other chronic medical problems.  [address] [place], [place], [facility] 09/20/2016 12:53 PM

## 2016-09-21 ENCOUNTER — Ambulatory Visit (INDEPENDENT_AMBULATORY_CARE_PROVIDER_SITE_OTHER): Payer: Medicare HMO | Admitting: Cardiology

## 2016-09-21 ENCOUNTER — Encounter: Payer: Self-pay | Admitting: Cardiology

## 2016-09-21 VITALS — BP 148/60 | HR 57 | Ht 63.0 in | Wt 170.0 lb

## 2016-09-21 DIAGNOSIS — E782 Mixed hyperlipidemia: Secondary | ICD-10-CM | POA: Diagnosis not present

## 2016-09-21 DIAGNOSIS — I272 Pulmonary hypertension, unspecified: Secondary | ICD-10-CM

## 2016-09-21 DIAGNOSIS — I5032 Chronic diastolic (congestive) heart failure: Secondary | ICD-10-CM | POA: Diagnosis not present

## 2016-09-21 DIAGNOSIS — I10 Essential (primary) hypertension: Secondary | ICD-10-CM | POA: Diagnosis not present

## 2016-09-21 LAB — COMPLETE METABOLIC PANEL WITH GFR
ALT: 10 U/L (ref 6–29)
AST: 12 U/L (ref 10–35)
Albumin: 4.3 g/dL (ref 3.6–5.1)
Alkaline Phosphatase: 90 U/L (ref 33–130)
BUN: 25 mg/dL (ref 7–25)
CALCIUM: 9.3 mg/dL (ref 8.6–10.4)
CHLORIDE: 101 mmol/L (ref 98–110)
CO2: 21 mmol/L (ref 20–32)
Creat: 1.75 mg/dL — ABNORMAL HIGH (ref 0.60–0.93)
GFR, EST AFRICAN AMERICAN: 32 mL/min — AB (ref >=60)
GFR, Est Non African American: 28 mL/min — ABNORMAL LOW (ref >=60)
Glucose, Bld: 224 mg/dL — ABNORMAL HIGH (ref 70–99)
POTASSIUM: 4.2 mmol/L (ref 3.5–5.3)
Sodium: 140 mmol/L (ref 135–146)
Total Bilirubin: 0.4 mg/dL (ref 0.2–1.2)
Total Protein: 7.4 g/dL (ref 6.1–8.1)

## 2016-09-21 LAB — LIPID PANEL
CHOL/HDL RATIO: 2.5 ratio (ref <5.0)
CHOLESTEROL: 162 mg/dL (ref <200)
HDL: 64 mg/dL (ref >50)
LDL Cholesterol: 56 mg/dL (ref <100)
Triglycerides: 208 mg/dL — ABNORMAL HIGH (ref <150)
VLDL: 42 mg/dL — AB (ref <30)

## 2016-09-21 LAB — TSH: TSH: 2.46 mIU/L

## 2016-09-21 LAB — MICROALBUMIN, URINE: MICROALB UR: 3 mg/dL

## 2016-09-21 LAB — HEMOGLOBIN A1C
HEMOGLOBIN A1C: 7 % — AB (ref <5.7)
MEAN PLASMA GLUCOSE: 154 mg/dL

## 2016-09-21 NOTE — Patient Instructions (Signed)

## 2016-09-21 NOTE — Progress Notes (Signed)
Clinical Summary Laura Best is a 76 y.o.female seen today for follow up of the following medical problems.   1. Diastolic heart failure  - no recent SOB or DOE. Sleeps with 4 pillows chronically.  - weights down 10 lbs since 04/2016. Home weights also around 170 - compliant with meds  2. HTN - Cr 2.07 BUN 77 GFR 23, from notes there is a question of possible right renal artery stenosis.   - home bp's 120-130s/60s - compliant with meds  3. Bradycardia   - heart rates mid 50s at home. Denies any lightheadedness or dizziness.   4. Pulmonary venous HTN - RHC as reported below.  - no recent symptoms, compliant with diuretics.   5. History of ASD repair - no evidence of shunt by prior cath  6. CKD IV - followed by nephrology - renal function has been stable  7. Hyperlipidemia - 08/2016: TC 162 TG 208 HDL 64 LDL 56 - compliant with statin Past Medical History:  Diagnosis Date  . Arthritis    "hands, feet" (02/17/2016)  . CHF (congestive heart failure) ([place])   . CKD (chronic kidney disease) stage 3, GFR 30-59 ml/min 12/25/2012  . COPD (chronic obstructive pulmonary disease) ([place])   . Diastolic heart failure ([place])   . Hyperlipidemia   . Hypertension   . Osteoporosis   . Osteoporosis   . Pneumonia    "several times" (02/17/2016)  . Small bowel obstruction ([place]) 02/17/2016  . Tricuspid regurgitation    Echo 05/02/06-Nml LV. Mild MR. Mod TR  . Type II diabetes mellitus ([place])   . Vitamin D deficiency      Allergies  Allergen Reactions  . Ace Inhibitors Other (See Comments)    Hyperkalemia  . Actos [Pioglitazone] Swelling  . Aspirin Other (See Comments)    G.I. Upset. In high doses  . Metformin And Related Diarrhea     Current Outpatient Prescriptions  Medication Sig Dispense Refill  . acetaminophen (TYLENOL) 500 MG tablet Take 500 mg by mouth every 6 (six) hours as needed for pain.    Marland Kitchen amLODipine (NORVASC) 10 MG tablet TAKE ONE TABLET BY MOUTH ONCE DAILY 90  tablet 3  . aspirin EC 81 MG tablet Take 81 mg by mouth daily.    . Blood Glucose Monitoring Suppl (ONE TOUCH ULTRA SYSTEM KIT) w/Device KIT 1 meter 1 each 0  . calcitRIOL (ROCALTROL) 0.25 MCG capsule Take 0.25 mcg by mouth every Monday, Wednesday, and Friday.     . carvedilol (COREG) 6.25 MG tablet Take 1 tablet (6.25 mg total) by mouth 2 (two) times daily. 60 tablet 3  . Cholecalciferol (VITAMIN D) 2000 UNITS CAPS Take 2,000 Units by mouth daily.     . cloNIDine (CATAPRES) 0.2 MG tablet TAKE ONE TABLET BY MOUTH TWICE DAILY 60 tablet 3  . furosemide (LASIX) 40 MG tablet TAKE 1 TABLET BY MOUTH ONCE DAILY 90 tablet 0  . gabapentin (NEURONTIN) 100 MG capsule Take 1 -2 at night for peripheral neuropathy 60 capsule 3  . glucose blood test strip 1 each by Other route 4 (four) times daily -  before meals and at bedtime. Use as instructed 150 each 12  . glucose blood test strip Use as directed. Check blood sugar 3 times daily before meals and at bedtime. 100 each 12  . insulin aspart (NOVOLOG) 100 UNIT/ML FlexPen Take 7 units with luch and supper.  Inject 15 minutes prior to meal or immediately after meal. 15 mL  11  . Lancets (ONETOUCH ULTRASOFT) lancets Use as instructed 100 each 12  . LEVEMIR FLEXTOUCH 100 UNIT/ML Pen INJECT 22 UNITS SUBCUTANOUESLY ONCE DAILY AT 10 PM 15 pen 2  . omeprazole (PRILOSEC) 20 MG capsule TAKE ONE CAPSULE BY MOUTH ONCE DAILY 90 capsule 3  . omeprazole (PRILOSEC) 20 MG capsule TAKE ONE CAPSULE BY MOUTH ONCE DAILY 90 capsule 3  . polyethylene glycol (MIRALAX / GLYCOLAX) packet Take 17 g by mouth daily. 14 each 0  . psyllium (HYDROCIL/METAMUCIL) 95 % PACK Take 1 packet by mouth 3 (three) times daily. 56 each 0  . RELION SHORT PEN NEEDLES 31G X 8 MM MISC USE ONE PEN NEEDLE ONCE DAILY AS DIRECTED 50 each 11  . simvastatin (ZOCOR) 20 MG tablet TAKE ONE TABLET BY MOUTH ONCE DAILY AT BEDTIME 90 tablet 3   No current facility-administered medications for this visit.      Past  Surgical History:  Procedure Laterality Date  . APPENDECTOMY    . HEART CHAMBER REVISION     patched hole --[place]  . LAPAROSCOPIC APPENDECTOMY N/A 09/27/2013   Procedure: APPENDECTOMY - CONVERTED FROM LAPAROSCOPIC;  Surgeon: Donnie Mesa, MD;  Location: [facility];  Service: General;  Laterality: N/A;  . RIGHT/LEFT HEART CATH AND CORONARY ANGIOGRAPHY N/A 05/24/2016   Procedure: Right/Left Heart Cath and Coronary Angiography;  Surgeon: Larey Dresser, MD;  Location: [facility] CV LAB;  Service: Cardiovascular;  Laterality: N/A;  . TUBAL LIGATION       Allergies  Allergen Reactions  . Ace Inhibitors Other (See Comments)    Hyperkalemia  . Actos [Pioglitazone] Swelling  . Aspirin Other (See Comments)    G.I. Upset. In high doses  . Metformin And Related Diarrhea      Family History  Problem Relation Age of Onset  . Cancer Father   . Diabetes Father   . Cancer Brother        Brain  . Cancer Sister        abdominal fat     Social History Laura Best reports that she quit smoking about 7 years ago. Her smoking use included Cigarettes. She has a 25.00 pack-year smoking history. She has never used smokeless tobacco. Laura Best reports that she does not drink alcohol.   Review of Systems CONSTITUTIONAL: No weight loss, fever, chills, weakness or fatigue.  HEENT: Eyes: No visual loss, blurred vision, double vision or yellow sclerae.No hearing loss, sneezing, congestion, runny nose or sore throat.  SKIN: No rash or itching.  CARDIOVASCULAR: per hpi RESPIRATORY: per hpi GASTROINTESTINAL: No anorexia, nausea, vomiting or diarrhea. No abdominal pain or blood.  GENITOURINARY: No burning on urination, no polyuria NEUROLOGICAL: No headache, dizziness, syncope, paralysis, ataxia, numbness or tingling in the extremities. No change in bowel or bladder control.  MUSCULOSKELETAL: No muscle, back pain, joint pain or stiffness.  LYMPHATICS: No enlarged nodes. No history of splenectomy.    PSYCHIATRIC: No history of depression or anxiety.  ENDOCRINOLOGIC: No reports of sweating, cold or heat intolerance. No polyuria or polydipsia.  Marland Kitchen   Physical Examination Vitals:   09/21/16 1040  BP: (!) 148/60  Pulse: (!) 57  SpO2: 95%   Vitals:   09/21/16 1040  Weight: 170 lb (77.1 kg)  Height: _0  (1.6 m)    Gen: resting comfortably, no acute distress HEENT: no scleral icterus, pupils equal round and reactive, no palptable cervical adenopathy,  CV: RRR, on m/r/g, no jvd Resp: Clear to auscultation bilaterally GI:  abdomen is soft, non-tender, non-distended, normal bowel sounds, no hepatosplenomegaly MSK: extremities are warm, no edema.  Skin: warm, no rash Neuro:  no focal deficits Psych: appropriate affect   Diagnostic Studies 12/2012 Echo LVEF 55%, mild LVH, grade II diastolic dysfunction,   4/62/86 EKG: sinus bradycardia rate 59  05/2016 LHC/RHC 1. Near-normal filling pressures.  2. No oxygen saturation step-up between RA and PA to suggest residual ASD.  3. Mild pulmonary hypertension with low PVR, suggests pulmonary venous hypertension.  4. Nonobstructive CAD, most significant disease being serial 50% stenoses in the proximal to mid LCx.    Would recommend aspirin and statin treatment for CAD.   She is on an adequate dose of Lasix, would not change.   Hydration post-procedure given CKD.   03/2016 echo Study Conclusions  - Left ventricle: The cavity size was normal. There was moderate   concentric hypertrophy. Systolic function was normal. The   estimated ejection fraction was in the range of 55% to 60%. Wall   motion was normal; there were no regional wall motion   abnormalities. Features are consistent with a pseudonormal left   ventricular filling pattern, with concomitant abnormal relaxation   and increased filling pressure (grade 2 diastolic dysfunction). - Mitral valve: Calcified annulus. There was trivial regurgitation. - Left atrium: The  atrium was moderately dilated. - Right ventricle: The cavity size was moderately dilated. Wall   thickness was normal. Systolic function was mildly to moderately   reduced. - Pulmonary arteries: PA peak pressure: 49 mm Hg (S).  Impressions:  - The right ventricular systolic pressure was increased consistent   with moderate pulmonary hypertension.   Assessment and Plan   1. Chronic diastolic heart failure - no current symptoms, she will continue current meds  2. HTN -controlled based on home bp log - continue current meds. If low heart rates become a bigger issue can further wean clonidine.   3. Pulmonary venous HTN - no recent symptoms, continue current diuretics  4. Hyperlipidemia - at goal, continue current statin.    Arnoldo Lenis, M.D.,

## 2016-09-29 ENCOUNTER — Encounter (HOSPITAL_COMMUNITY): Payer: Medicare HMO | Admitting: Cardiology

## 2016-10-13 DIAGNOSIS — R69 Illness, unspecified: Secondary | ICD-10-CM | POA: Diagnosis not present

## 2016-10-16 ENCOUNTER — Ambulatory Visit (HOSPITAL_COMMUNITY)
Admission: RE | Admit: 2016-10-16 | Discharge: 2016-10-16 | Disposition: A | Discharge status: Home / Self Care | Payer: Medicare HMO | Source: Ambulatory Visit | Attending: Cardiology | Admitting: Cardiology

## 2016-10-16 ENCOUNTER — Encounter (HOSPITAL_COMMUNITY): Payer: Self-pay | Admitting: Cardiology

## 2016-10-16 VITALS — BP 150/62 | HR 60 | Wt 173.0 lb

## 2016-10-16 DIAGNOSIS — Z794 Long term (current) use of insulin: Secondary | ICD-10-CM | POA: Diagnosis not present

## 2016-10-16 DIAGNOSIS — N183 Chronic kidney disease, stage 3 unspecified: Secondary | ICD-10-CM

## 2016-10-16 DIAGNOSIS — J449 Chronic obstructive pulmonary disease, unspecified: Secondary | ICD-10-CM | POA: Insufficient documentation

## 2016-10-16 DIAGNOSIS — Z833 Family history of diabetes mellitus: Secondary | ICD-10-CM | POA: Insufficient documentation

## 2016-10-16 DIAGNOSIS — E785 Hyperlipidemia, unspecified: Secondary | ICD-10-CM | POA: Diagnosis not present

## 2016-10-16 DIAGNOSIS — I130 Hypertensive heart and chronic kidney disease with heart failure and stage 1 through stage 4 chronic kidney disease, or unspecified chronic kidney disease: Secondary | ICD-10-CM | POA: Insufficient documentation

## 2016-10-16 DIAGNOSIS — I5032 Chronic diastolic (congestive) heart failure: Secondary | ICD-10-CM | POA: Insufficient documentation

## 2016-10-16 DIAGNOSIS — Z808 Family history of malignant neoplasm of other organs or systems: Secondary | ICD-10-CM | POA: Diagnosis not present

## 2016-10-16 DIAGNOSIS — Z79899 Other long term (current) drug therapy: Secondary | ICD-10-CM | POA: Diagnosis not present

## 2016-10-16 DIAGNOSIS — I2510 Atherosclerotic heart disease of native coronary artery without angina pectoris: Secondary | ICD-10-CM | POA: Insufficient documentation

## 2016-10-16 DIAGNOSIS — E1122 Type 2 diabetes mellitus with diabetic chronic kidney disease: Secondary | ICD-10-CM | POA: Insufficient documentation

## 2016-10-16 DIAGNOSIS — I10 Essential (primary) hypertension: Secondary | ICD-10-CM | POA: Diagnosis not present

## 2016-10-16 DIAGNOSIS — Z7982 Long term (current) use of aspirin: Secondary | ICD-10-CM | POA: Insufficient documentation

## 2016-10-16 MED ORDER — CLONIDINE HCL 0.3 MG PO TABS: 0.3000 mg | ORAL_TABLET | Freq: Two times a day (BID) | ORAL | 3 refills | Status: DC

## 2016-10-16 NOTE — Patient Instructions (Signed)
Increase Clonidine 0.3 mg (1 tab), twice a day   Your physician recommends that you schedule a follow-up appointment in: 6 months we will contact you.

## 2016-10-17 NOTE — Progress Notes (Signed)
PCP: [name] Cardiology: Dr. Harl Bowie HF Cardiology: Dr. Aundra Dubin  76 yo with history of CKD stage 3, HTN, COPD, ASD repair, and chronic diastolic CHF was referred by Dr. Harl Bowie for evaluation of CHF and worsening dyspnea.   Patient was admitted in 3/18 with volume overload/diastolic CHF and was diuresed.  She developed AKI.  She was eventually discharged on Lasix 40 mg daily (had been on this prior to admission).  She has had one earlier CHF hospitalization back in 2014.    I did a right and left heart cath in 5/18 that showed very mildly elevated PCWP and mild pulmonary venous hypertension.  There was nonobstructive CAD.   She returns today for followup of CHF/dyspnea.  Overall doing well.  She denies exertional dyspnea though not particularly active.  No chest pain.  No lightheadedness/dizziness.  BP is high today.    Labs (3/18): K 4.6, creatinine 1.96 Labs (5/18): K 4.6, creatinine 1.8 Labs (8/18): LDL 56, HDL 64, K 4.2, creatinine 1.75  PMH: 1. CKD stage 3.  2. HTN 3. Type II diabetes 4. Hyperlipidemia 5. ASD repair:  Surgical by Dr. Servando Snare in 1993.  6. h/o shingles 7. COPD 8. Chronic diastolic CHF:  - Echo (3/15) with EF 55-60%, moderate LVH, grade II diastolic dysfunction, RV moderately dilated and moderately dysfunctional, PASP 49 mmHg.  - L/RHC 05/2016: mean RA 6, PA 48/18 mean 28, mean PCWP 16, CI 3.24, PVR 2.0 WU. No step up in oxygen saturation to suggest left to right shunt.  Nonobstructive CAD with serial 50% stenoses in proximal and mid LCx.   SH: Lives in [place], nonsmoker, no ETOH.    Family History  Problem Relation Age of Onset  . Cancer Father   . Diabetes Father   . Cancer Brother        Brain  . Cancer Sister        abdominal fat   ROS: All systems reviewed and negative except as per HPI.   Current Outpatient Prescriptions  Medication Sig Dispense Refill  . acetaminophen (TYLENOL) 500 MG tablet Take 500 mg by mouth every 6 (six) hours as  needed for pain.    Marland Kitchen amLODipine (NORVASC) 10 MG tablet TAKE ONE TABLET BY MOUTH ONCE DAILY 90 tablet 3  . aspirin EC 81 MG tablet Take 81 mg by mouth daily.    . Blood Glucose Monitoring Suppl (ONE TOUCH ULTRA SYSTEM KIT) w/Device KIT 1 meter 1 each 0  . calcitRIOL (ROCALTROL) 0.25 MCG capsule Take 0.25 mcg by mouth every Monday, Wednesday, and Friday.     . carvedilol (COREG) 6.25 MG tablet Take 1 tablet (6.25 mg total) by mouth 2 (two) times daily. 60 tablet 3  . Cholecalciferol (VITAMIN D) 2000 UNITS CAPS Take 2,000 Units by mouth daily.     . cloNIDine (CATAPRES) 0.3 MG tablet Take 1 tablet (0.3 mg total) by mouth 2 (two) times daily. 60 tablet 3  . furosemide (LASIX) 40 MG tablet TAKE 1 TABLET BY MOUTH ONCE DAILY 90 tablet 0  . gabapentin (NEURONTIN) 100 MG capsule Take 1 -2 at night for peripheral neuropathy 60 capsule 3  . glucose blood test strip Use as directed. Check blood sugar 3 times daily before meals and at bedtime. 100 each 12  . insulin aspart (NOVOLOG) 100 UNIT/ML FlexPen Take 7 units with luch and supper.  Inject 15 minutes prior to meal or immediately after meal. 15 mL 11  . Lancets (ONETOUCH ULTRASOFT) lancets Use as  instructed 100 each 12  . LEVEMIR FLEXTOUCH 100 UNIT/ML Pen INJECT 22 UNITS SUBCUTANOUESLY ONCE DAILY AT 10 PM 15 pen 2  . omeprazole (PRILOSEC) 20 MG capsule TAKE ONE CAPSULE BY MOUTH ONCE DAILY 90 capsule 3  . polyethylene glycol (MIRALAX / GLYCOLAX) packet Take 17 g by mouth daily. 14 each 0  . psyllium (HYDROCIL/METAMUCIL) 95 % PACK Take 1 packet by mouth 3 (three) times daily. 56 each 0  . RELION SHORT PEN NEEDLES 31G X 8 MM MISC USE ONE PEN NEEDLE ONCE DAILY AS DIRECTED 50 each 11  . simvastatin (ZOCOR) 20 MG tablet TAKE ONE TABLET BY MOUTH ONCE DAILY AT BEDTIME 90 tablet 3   No current facility-administered medications for this encounter.    BP (!) 150/62   Pulse 60   Wt 173 lb (78.5 kg)   SpO2 95%   BMI 30.65 kg/m  General: NAD Neck: JVP 8 cm,  no thyromegaly or thyroid nodule.  Lungs: Clear to auscultation bilaterally with normal respiratory effort. CV: Nondisplaced PMI.  Heart regular S1/S2, no S3/S4, 1/6 SEM RUSB.  Trace ankle edema.  No carotid bruit.  Normal pedal pulses.  Abdomen: Soft, nontender, no hepatosplenomegaly, no distention.  Skin: Intact without lesions or rashes.  Neurologic: Alert and oriented x 3.  Psych: Normal affect. Extremities: No clubbing or cyanosis.  HEENT: Normal.   Assessment/Plan: 1. Chronic diastolic CHF: Echo in 4/61 with moderate LVH, grade II diastolic dysfunction, normal EF, PASP 49 mmHg with RV dysfunction.   Right and left heart cath done on 05/24/16, low PVR consistent with pulmonary venous HTN and mildly elevated PCWP. NYHA class II, volume status looks ok.  - Continue Lasix 40 mg daily. Creatinine stable on recent BMET.   2. CKD stage III: Baseline creatinine 1.6-1.8, stable in 8/18.  3. HTN: BP elevated today, increase clonidine to 0.3 mg bid.  4. H/o ASD repair: Surgical closure in her 43s. No evidence for significant left to right shunt on RHC in 5/18.  5. CAD: Nonobstructive.  Continue statin and ASA 81 daily.   Follow up in 6 months.   Laura Best 10/17/2016

## 2016-11-07 ENCOUNTER — Other Ambulatory Visit: Payer: Self-pay | Admitting: Physician Assistant

## 2016-11-07 DIAGNOSIS — E1142 Type 2 diabetes mellitus with diabetic polyneuropathy: Secondary | ICD-10-CM

## 2016-11-07 NOTE — Telephone Encounter (Signed)
Medication refilled per protocol. 

## 2016-11-08 DIAGNOSIS — M545 Low back pain: Secondary | ICD-10-CM | POA: Diagnosis not present

## 2016-11-08 DIAGNOSIS — M170 Bilateral primary osteoarthritis of knee: Secondary | ICD-10-CM | POA: Diagnosis not present

## 2016-11-13 ENCOUNTER — Telehealth: Payer: Self-pay | Admitting: Physician Assistant

## 2016-11-13 NOTE — Telephone Encounter (Signed)
Pt needs refill on test strips for one touch ultra meter, still uses [facility] in [place].

## 2016-11-14 ENCOUNTER — Other Ambulatory Visit: Payer: Self-pay

## 2016-11-14 DIAGNOSIS — E118 Type 2 diabetes mellitus with unspecified complications: Secondary | ICD-10-CM

## 2016-11-14 DIAGNOSIS — Z794 Long term (current) use of insulin: Principal | ICD-10-CM

## 2016-11-14 MED ORDER — GLUCOSE BLOOD VI STRP: ORAL_STRIP | 12 refills | Status: DC

## 2016-11-14 NOTE — Telephone Encounter (Signed)
Fax came from [place] that a dx code needed to be added to diabetic test strip  rx so pharmacy could bill to part b plan

## 2016-11-15 NOTE — Telephone Encounter (Signed)
rx sent to pharmacy pt aware

## 2016-11-27 DIAGNOSIS — M19071 Primary osteoarthritis, right ankle and foot: Secondary | ICD-10-CM | POA: Diagnosis not present

## 2016-11-27 DIAGNOSIS — M19072 Primary osteoarthritis, left ankle and foot: Secondary | ICD-10-CM | POA: Diagnosis not present

## 2016-12-06 DIAGNOSIS — M19011 Primary osteoarthritis, right shoulder: Secondary | ICD-10-CM | POA: Diagnosis not present

## 2016-12-06 DIAGNOSIS — M19032 Primary osteoarthritis, left wrist: Secondary | ICD-10-CM | POA: Diagnosis not present

## 2016-12-06 DIAGNOSIS — M19031 Primary osteoarthritis, right wrist: Secondary | ICD-10-CM | POA: Diagnosis not present

## 2016-12-13 ENCOUNTER — Other Ambulatory Visit (HOSPITAL_COMMUNITY): Payer: Self-pay | Admitting: Cardiology

## 2016-12-21 ENCOUNTER — Ambulatory Visit: Payer: Medicare HMO | Admitting: Physician Assistant

## 2016-12-21 DIAGNOSIS — L401 Generalized pustular psoriasis: Secondary | ICD-10-CM | POA: Diagnosis not present

## 2016-12-27 ENCOUNTER — Ambulatory Visit: Payer: Medicare HMO | Admitting: Physician Assistant

## 2016-12-27 ENCOUNTER — Encounter: Payer: Self-pay | Admitting: Physician Assistant

## 2016-12-27 ENCOUNTER — Other Ambulatory Visit: Payer: Self-pay

## 2016-12-27 VITALS — BP 140/62 | HR 59 | Temp 98.2°F | Resp 16 | Ht 63.0 in | Wt 170.6 lb

## 2016-12-27 DIAGNOSIS — I10 Essential (primary) hypertension: Secondary | ICD-10-CM | POA: Diagnosis not present

## 2016-12-27 DIAGNOSIS — E785 Hyperlipidemia, unspecified: Secondary | ICD-10-CM | POA: Diagnosis not present

## 2016-12-27 DIAGNOSIS — R1032 Left lower quadrant pain: Secondary | ICD-10-CM

## 2016-12-27 DIAGNOSIS — Z794 Long term (current) use of insulin: Secondary | ICD-10-CM | POA: Diagnosis not present

## 2016-12-27 DIAGNOSIS — N183 Chronic kidney disease, stage 3 unspecified: Secondary | ICD-10-CM

## 2016-12-27 DIAGNOSIS — E1121 Type 2 diabetes mellitus with diabetic nephropathy: Secondary | ICD-10-CM | POA: Diagnosis not present

## 2016-12-27 DIAGNOSIS — E1142 Type 2 diabetes mellitus with diabetic polyneuropathy: Secondary | ICD-10-CM

## 2016-12-27 DIAGNOSIS — I272 Pulmonary hypertension, unspecified: Secondary | ICD-10-CM | POA: Diagnosis not present

## 2016-12-27 LAB — URINALYSIS, ROUTINE W REFLEX MICROSCOPIC
Bacteria, UA: NONE SEEN /HPF
Bilirubin Urine: NEGATIVE
GLUCOSE, UA: NEGATIVE
HYALINE CAST: NONE SEEN /LPF
Hgb urine dipstick: NEGATIVE
Ketones, ur: NEGATIVE
Nitrite: NEGATIVE
Protein, ur: NEGATIVE
RBC / HPF: NONE SEEN /HPF (ref 0–2)
Specific Gravity, Urine: 1.015 (ref 1.001–1.03)
pH: 5.5 (ref 5.0–8.0)

## 2016-12-27 LAB — CBC
HCT: 37.6 % (ref 35.0–45.0)
Hemoglobin: 12.6 g/dL (ref 11.7–15.5)
MCH: 29.4 pg (ref 27.0–33.0)
MCHC: 33.5 g/dL (ref 32.0–36.0)
MCV: 87.6 fL (ref 80.0–100.0)
Platelets: 195 10*3/uL (ref 140–400)
RBC: 4.29 10*6/uL (ref 3.80–5.10)
RDW: 15 % (ref 11.0–15.0)
WBC: 6.9 10*3/uL (ref 3.8–10.8)

## 2016-12-27 LAB — MICROSCOPIC MESSAGE

## 2016-12-27 NOTE — Progress Notes (Signed)
Patient ID: MAREESA GATHRIGHT MRN: [id number], DOB: September 19, 1940, 76 y.o. Date of Encounter: _0 @  Chief Complaint:  Chief Complaint  Patient presents with  . 3 month follow up    HPI: 76 y.o. year old female  presents with above.   Presents today for her routine 4-monthoffice visit. Her last visit with me was for Medicare physical/R OV on 09/20/16.  Today she reports that she has been having some left lower quadrant abdominal pain. She reports that this has been off and on since around time of Thanksgiving. Says that on the Friday after Thanksgiving on November 25 that she " spitup more than usual"----- says that on a usual basis that she can just be sitting there and that acid will come up into her throat and she has to spit it out.  Verified that with this whether this is "vomit" or cough.  No cough.  Says that she can just be sitting there and without any warning acid comes up and she spits it.  However says that on that Friday the day after Thanksgiving that similar thing happened except a much larger amount came up and that it was extremely bitter taste.  Since that one episode on the Friday the day after Thanksgiving, that things have returned to normal regarding that.  Says it has returned to her just occasionally spitting up very small amount. Has had no "vomiting ". She reports that she has been feeling a discomfort in her left lower abdomen off and on since around that time. Reports that she has had no diarrhea.  Reports that in general she has problems with some mild constipation.  Says that 1 of her last stools did look greener than usual both that morning and again that evening.  She has no other specific concerns to address today.     Past Medical History:  Diagnosis Date  . Arthritis    "hands, feet" (02/17/2016)  . CHF (congestive heart failure) ([facility]   . CKD (chronic kidney disease) stage 3, GFR 30-59 ml/min (HCC) 12/25/2012  . COPD (chronic obstructive pulmonary  disease) ([facility]   . Diastolic heart failure ([place]   . Hyperlipidemia   . Hypertension   . Osteoporosis   . Osteoporosis   . Pneumonia    "several times" (02/17/2016)  . Small bowel obstruction ([facility] 02/17/2016  . Tricuspid regurgitation    Echo 05/02/06-Nml LV. Mild MR. Mod TR  . Type II diabetes mellitus ([facility]   . Vitamin D deficiency      Home Meds: Outpatient Medications Prior to Visit  Medication Sig Dispense Refill  . acetaminophen (TYLENOL) 500 MG tablet Take 500 mg by mouth every 6 (six) hours as needed for pain.    .Marland KitchenamLODipine (NORVASC) 10 MG tablet TAKE ONE TABLET BY MOUTH ONCE DAILY 90 tablet 3  . aspirin EC 81 MG tablet Take 81 mg by mouth daily.    . Blood Glucose Monitoring Suppl (ONE TOUCH ULTRA SYSTEM KIT) w/Device KIT 1 meter 1 each 0  . calcitRIOL (ROCALTROL) 0.25 MCG capsule Take 0.25 mcg by mouth every Monday, Wednesday, and Friday.     . carvedilol (COREG) 6.25 MG tablet TAKE 1 TABLET BY MOUTH TWICE DAILY 60 tablet 3  . Cholecalciferol (VITAMIN D) 2000 UNITS CAPS Take 2,000 Units by mouth daily.     . cloNIDine (CATAPRES) 0.3 MG tablet Take 1 tablet (0.3 mg total) by mouth 2 (two) times daily. 60 tablet 3  . furosemide (LASIX)  40 MG tablet TAKE 1 TABLET BY MOUTH ONCE DAILY 90 tablet 0  . gabapentin (NEURONTIN) 100 MG capsule TAKE 1 TO 2 CAPSULES BY MOUTH AT BEDTIME FOR  PERIPHERAL NEUROPATHY 120 capsule 0  . glucose blood test strip Use as directed. Check blood sugar 3 times daily before meals and at bedtime. 100 each 12  . insulin aspart (NOVOLOG) 100 UNIT/ML FlexPen Take 7 units with luch and supper.  Inject 15 minutes prior to meal or immediately after meal. 15 mL 11  . Lancets (ONETOUCH ULTRASOFT) lancets Use as instructed 100 each 12  . LEVEMIR FLEXTOUCH 100 UNIT/ML Pen INJECT 22 UNITS SUBCUTANEOUSLY ONCE DAILY AT 10 PM 15 mL 0  . omeprazole (PRILOSEC) 20 MG capsule TAKE ONE CAPSULE BY MOUTH ONCE DAILY 90 capsule 3  . polyethylene glycol (MIRALAX / GLYCOLAX)  packet Take 17 g by mouth daily. 14 each 0  . psyllium (HYDROCIL/METAMUCIL) 95 % PACK Take 1 packet by mouth 3 (three) times daily. 56 each 0  . RELION SHORT PEN NEEDLES 31G X 8 MM MISC USE ONE PEN NEEDLE ONCE DAILY AS DIRECTED 50 each 11  . simvastatin (ZOCOR) 20 MG tablet TAKE ONE TABLET BY MOUTH ONCE DAILY AT BEDTIME 90 tablet 3   No facility-administered medications prior to visit.     Allergies:  Allergies  Allergen Reactions  . Ace Inhibitors Other (See Comments)    Hyperkalemia  . Actos [Pioglitazone] Swelling  . Aspirin Other (See Comments)    G.I. Upset. In high doses  . Metformin And Related Diarrhea    Social History   Socioeconomic History  . Marital status: Widowed    Spouse name: Not on file  . Number of children: Not on file  . Years of education: Not on file  . Highest education level: Not on file  Social Needs  . Financial resource strain: Not on file  . Food insecurity - worry: Not on file  . Food insecurity - inability: Not on file  . Transportation needs - medical: Not on file  . Transportation needs - non-medical: Not on file  Occupational History  . Not on file  Tobacco Use  . Smoking status: Former Smoker    Packs/day: 0.50    Years: 50.00    Pack years: 25.00    Types: Cigarettes    Last attempt to quit: 01/19/2009    Years since quitting: 7.9  . Smokeless tobacco: Never Used  Substance and Sexual Activity  . Alcohol use: No    Alcohol/week: 0.0 oz  . Drug use: No  . Sexual activity: No  Other Topics Concern  . Not on file  Social History Narrative   Entered 10/2013:   She has never driven.   She lives with her son.    Family History  Problem Relation Age of Onset  . Cancer Father   . Diabetes Father   . Cancer Brother        Brain  . Cancer Sister        abdominal fat     Review of Systems:  See HPI for pertinent ROS. All other ROS negative.    Physical Exam: Blood pressure 140/62, pulse (!) 59, temperature 98.2 F (36.8  C), temperature source Oral, resp. rate 16, height _0  (1.6 m), weight 77.4 kg (170 lb 9.6 oz), SpO2 97 %., Body mass index is 30.22 kg/m. General: WNWD WF.  Appears in no acute distress. Neck: Supple. No thyromegaly. No lymphadenopathy. Lungs: Clear  bilaterally to auscultation without wheezes, rales, or rhonchi. Breathing is unlabored. Heart: RRR with S1 S2. No murmurs, rubs, or gallops. Abdomen: Abdomen is distended moderately. Normoactive bowel sounds. No hepatomegaly. No rebound/guarding. No obvious abdominal masses. She reports tenderness with palpation left lower quadrant. Musculoskeletal:  Strength and tone normal for age. Extremities/Skin: Warm and dry.  No edema.  Neuro: Alert and oriented X 3. Moves all extremities spontaneously. Gait is normal. CNII-XII grossly in tact. Psych:  Responds to questions appropriately with a normal affect.     ASSESSMENT AND PLAN:  76 y.o. year old female with  1. Essential hypertension Blood Pressure is controlled.  Continue current medications.  Checked CMET 09/20/16 and were stable at that time.  We will recheck lab today given her abdominal pain.  See below.  2. Pulmonary hypertension [facility]) Per Cardiology  3. Type 2 diabetes mellitus with diabetic nephropathy, with long-term current use of insulin (HCC) She sees Dr. Lowanda Foster, Nephrology Recheck A1C to monitor this. - Hemoglobin A1c  4. Diabetic peripheral neuropathy associated with type 2 diabetes mellitus (HCC) Symptoms are controlled.  On gabapentin.  5. CKD (chronic kidney disease) stage 3, GFR 30-59 ml/min (HCC) She sees Dr. Lowanda Foster, Nephrology   6. Hyperlipidemia, unspecified hyperlipidemia type He is on simvastatin.  Lipid panel was good 09/20/16.  7. Abdominal pain, left lower quadrant CBC run here in office.  White count normal at 6.9.  Hemoglobin 12.6. Patient does not have transportation to get to a CT scan today.  She does have transportation to get to scan tomorrow so  we will schedule CT scan for tomorrow. Clear liquid diet.  We will follow-up with her when I get report from CT scan done tomorrow. - CBC - Urinalysis, Routine w reflex microscopic - COMPLETE METABOLIC PANEL WITH GFR - 8. Left lower quadrant pain - CT ABDOMEN PELVIS WO CONTRAST; Future   Signed, Olean Ree [place], [place], [facility] 12/27/2016 2:52 PM

## 2016-12-28 ENCOUNTER — Telehealth: Payer: Self-pay

## 2016-12-28 ENCOUNTER — Other Ambulatory Visit: Payer: Self-pay | Admitting: Family Medicine

## 2016-12-28 DIAGNOSIS — R1032 Left lower quadrant pain: Secondary | ICD-10-CM

## 2016-12-28 NOTE — Telephone Encounter (Signed)
Our office received a request for patient chart information for a hinged knee brace range of motion with suspension sleeve(L1833).  I called patient to inform her that per Olean Ree she was not aware of the patient having any problems with her knee or having any recent surgeries and that Medicare would not cover the charge. Patient then stated the company [facility] [employer]) had already sent her the brace and tod her the insurance would cover the charge.   [place] is requesting supporting notes for the knee brace. Olean Ree never approved the knee brace I will fax information to [place] [facility] that patient has not complained of knee problems to the provider per Provider

## 2016-12-30 DIAGNOSIS — L401 Generalized pustular psoriasis: Secondary | ICD-10-CM | POA: Diagnosis not present

## 2017-01-01 LAB — COMPLETE METABOLIC PANEL WITH GFR
AG Ratio: 1.3 (calc) (ref 1.0–2.5)
ALBUMIN MSPROF: 4 g/dL (ref 3.6–5.1)
ALT: 8 U/L (ref 6–29)
AST: 11 U/L (ref 10–35)
Alkaline phosphatase (APISO): 72 U/L (ref 33–130)
BUN/Creatinine Ratio: 15 (calc) (ref 6–22)
BUN: 26 mg/dL — AB (ref 7–25)
CALCIUM: 9.4 mg/dL (ref 8.6–10.4)
CO2: 30 mmol/L (ref 20–32)
CREATININE: 1.78 mg/dL — AB (ref 0.60–0.93)
Chloride: 104 mmol/L (ref 98–110)
GFR, EST NON AFRICAN AMERICAN: 27 mL/min/{1.73_m2} — AB (ref >=60)
GFR, Est African American: 32 mL/min/{1.73_m2} — ABNORMAL LOW (ref >=60)
GLOBULIN: 3 g/dL (ref 1.9–3.7)
GLUCOSE: 55 mg/dL — AB (ref 65–99)
Potassium: 3.7 mmol/L (ref 3.5–5.3)
SODIUM: 142 mmol/L (ref 135–146)
TOTAL PROTEIN: 7 g/dL (ref 6.1–8.1)
Total Bilirubin: 0.4 mg/dL (ref 0.2–1.2)

## 2017-01-01 LAB — HEMOGLOBIN A1C
EAG (MMOL/L): 8.4 (calc)
Hgb A1c MFr Bld: 6.9 % of total Hgb — ABNORMAL HIGH (ref <5.7)
MEAN PLASMA GLUCOSE: 151 (calc)

## 2017-01-03 ENCOUNTER — Telehealth: Payer: Self-pay

## 2017-01-03 NOTE — Telephone Encounter (Signed)
I spoke with patient and she is still having pain with left abdominal  and states she has her appointment set for 01/08/2017 @4pm  . Patient also states she has an odor with her urine but  Due to transportation she is not able to come in to get her urine checked
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
## 2017-01-04 NOTE — Telephone Encounter (Signed)
Initially, it was ordered as Urgent,stat--because I went and talked to Maudie Mercury, our Referral Coordinator at the time of the patients OV and discussed that CT needed to be done that day or the following morning--At the time of the OV--I also went to the lobby and spoke directly to patient's daughter-in law-- she was the one who had driven pt to OV that day and would be the one to drive her to the CT--- I spoke with her and discussed that she was available to transport pt to CT the following morning. That evening, Kim informed me that pt had transporation issues.

## 2017-01-04 NOTE — Telephone Encounter (Signed)
I spoke with patient and she stated [facility] was booked and that's why the scan was sch for the 17th. Patient also has to pick her contrast drink up and her son will not be able to pick that up until today or tomorrow. This request was not marked URGENT which is why the appointment was scheduled at a later date.

## 2017-01-04 NOTE — Telephone Encounter (Signed)
Did a urinalysis at her office visit and it looked normal. Why does she have the CT scheduled so far out?  I am concerned that she could have something serious going on in her abdomen causing the pain in her left abdomen.  We had planned to have the CT done the day after her office visit.  Find out if patient can possibly go get this done soon and, if so, discuss with Maudie Mercury about moving this up.  Also inform patient that if she develops fever or additional symptoms or increasing pain she will have to go to the ER.

## 2017-01-08 ENCOUNTER — Ambulatory Visit (HOSPITAL_COMMUNITY)
Admission: RE | Admit: 2017-01-08 | Discharge: 2017-01-08 | Disposition: A | Discharge status: Home / Self Care | Payer: Medicare HMO | Source: Ambulatory Visit | Attending: Physician Assistant | Admitting: Physician Assistant

## 2017-01-08 DIAGNOSIS — R1032 Left lower quadrant pain: Secondary | ICD-10-CM | POA: Insufficient documentation

## 2017-01-08 DIAGNOSIS — I700 Atherosclerosis of aorta: Secondary | ICD-10-CM | POA: Insufficient documentation

## 2017-01-08 DIAGNOSIS — K429 Umbilical hernia without obstruction or gangrene: Secondary | ICD-10-CM | POA: Insufficient documentation

## 2017-01-10 ENCOUNTER — Telehealth: Payer: Self-pay | Admitting: Physician Assistant

## 2017-01-10 ENCOUNTER — Other Ambulatory Visit: Payer: Self-pay | Admitting: Physician Assistant

## 2017-01-10 DIAGNOSIS — E1142 Type 2 diabetes mellitus with diabetic polyneuropathy: Secondary | ICD-10-CM

## 2017-01-10 NOTE — Telephone Encounter (Signed)
Patient is calling about her ct scan results done on Monday  [phone]

## 2017-01-10 NOTE — Telephone Encounter (Signed)
Refill appropriate 

## 2017-01-10 NOTE — Telephone Encounter (Addendum)
I spoke with patient and she states she has always had problems with constipation since birth and she takes a laxative daily. Patient states she has had bowel movements however her stomach is swollen, as well as her feet and legs.

## 2017-01-11 DIAGNOSIS — R69 Illness, unspecified: Secondary | ICD-10-CM | POA: Diagnosis not present

## 2017-01-11 NOTE — Telephone Encounter (Signed)
Regarding possible fluid retention--- reviewed chart-- - weight is stable--- 09/20/16 weight was 170.   --------------------------12/27/16 weight was also 170.  At her OV she did not mention any swelling and no swelling was noted on exam.   At her OV she id report significant pain and fullness in left abdomen---constipation, increased stool would explain those symptoms---recommend increase med for constipation.   If she is developing significant swelling then needs to come in for another visit to further evaluate.

## 2017-01-13 ENCOUNTER — Observation Stay (HOSPITAL_COMMUNITY): Payer: Medicare HMO

## 2017-01-13 ENCOUNTER — Other Ambulatory Visit: Payer: Self-pay

## 2017-01-13 ENCOUNTER — Encounter (HOSPITAL_COMMUNITY): Payer: Self-pay

## 2017-01-13 ENCOUNTER — Inpatient Hospital Stay (HOSPITAL_COMMUNITY)
Admission: EM | Admit: 2017-01-13 | Discharge: 2017-01-16 | DRG: 291 | Disposition: A | Discharge status: Home Health | Payer: Medicare HMO | Attending: Internal Medicine | Admitting: Internal Medicine

## 2017-01-13 ENCOUNTER — Emergency Department (HOSPITAL_COMMUNITY): Payer: Medicare HMO

## 2017-01-13 DIAGNOSIS — I4819 Other persistent atrial fibrillation: Secondary | ICD-10-CM

## 2017-01-13 DIAGNOSIS — E785 Hyperlipidemia, unspecified: Secondary | ICD-10-CM | POA: Diagnosis present

## 2017-01-13 DIAGNOSIS — I340 Nonrheumatic mitral (valve) insufficiency: Secondary | ICD-10-CM | POA: Diagnosis not present

## 2017-01-13 DIAGNOSIS — I493 Ventricular premature depolarization: Secondary | ICD-10-CM | POA: Diagnosis present

## 2017-01-13 DIAGNOSIS — R109 Unspecified abdominal pain: Secondary | ICD-10-CM

## 2017-01-13 DIAGNOSIS — I5033 Acute on chronic diastolic (congestive) heart failure: Secondary | ICD-10-CM | POA: Diagnosis present

## 2017-01-13 DIAGNOSIS — R0602 Shortness of breath: Secondary | ICD-10-CM | POA: Diagnosis not present

## 2017-01-13 DIAGNOSIS — I480 Paroxysmal atrial fibrillation: Secondary | ICD-10-CM | POA: Diagnosis present

## 2017-01-13 DIAGNOSIS — Z794 Long term (current) use of insulin: Secondary | ICD-10-CM

## 2017-01-13 DIAGNOSIS — R0902 Hypoxemia: Secondary | ICD-10-CM

## 2017-01-13 DIAGNOSIS — R7989 Other specified abnormal findings of blood chemistry: Secondary | ICD-10-CM | POA: Diagnosis not present

## 2017-01-13 DIAGNOSIS — N183 Chronic kidney disease, stage 3 unspecified: Secondary | ICD-10-CM | POA: Diagnosis present

## 2017-01-13 DIAGNOSIS — I10 Essential (primary) hypertension: Secondary | ICD-10-CM | POA: Diagnosis not present

## 2017-01-13 DIAGNOSIS — Z886 Allergy status to analgesic agent status: Secondary | ICD-10-CM

## 2017-01-13 DIAGNOSIS — I481 Persistent atrial fibrillation: Secondary | ICD-10-CM | POA: Diagnosis present

## 2017-01-13 DIAGNOSIS — E1122 Type 2 diabetes mellitus with diabetic chronic kidney disease: Secondary | ICD-10-CM | POA: Diagnosis present

## 2017-01-13 DIAGNOSIS — Z87891 Personal history of nicotine dependence: Secondary | ICD-10-CM

## 2017-01-13 DIAGNOSIS — I509 Heart failure, unspecified: Secondary | ICD-10-CM

## 2017-01-13 DIAGNOSIS — J449 Chronic obstructive pulmonary disease, unspecified: Secondary | ICD-10-CM | POA: Diagnosis present

## 2017-01-13 DIAGNOSIS — Z66 Do not resuscitate: Secondary | ICD-10-CM | POA: Diagnosis present

## 2017-01-13 DIAGNOSIS — I4891 Unspecified atrial fibrillation: Secondary | ICD-10-CM | POA: Diagnosis not present

## 2017-01-13 DIAGNOSIS — E118 Type 2 diabetes mellitus with unspecified complications: Secondary | ICD-10-CM | POA: Diagnosis present

## 2017-01-13 DIAGNOSIS — Z888 Allergy status to other drugs, medicaments and biological substances status: Secondary | ICD-10-CM

## 2017-01-13 DIAGNOSIS — I130 Hypertensive heart and chronic kidney disease with heart failure and stage 1 through stage 4 chronic kidney disease, or unspecified chronic kidney disease: Principal | ICD-10-CM | POA: Diagnosis present

## 2017-01-13 DIAGNOSIS — I2510 Atherosclerotic heart disease of native coronary artery without angina pectoris: Secondary | ICD-10-CM | POA: Diagnosis present

## 2017-01-13 DIAGNOSIS — I272 Pulmonary hypertension, unspecified: Secondary | ICD-10-CM | POA: Diagnosis present

## 2017-01-13 DIAGNOSIS — Z7982 Long term (current) use of aspirin: Secondary | ICD-10-CM

## 2017-01-13 DIAGNOSIS — M810 Age-related osteoporosis without current pathological fracture: Secondary | ICD-10-CM | POA: Diagnosis present

## 2017-01-13 DIAGNOSIS — N184 Chronic kidney disease, stage 4 (severe): Secondary | ICD-10-CM | POA: Diagnosis present

## 2017-01-13 DIAGNOSIS — D638 Anemia in other chronic diseases classified elsewhere: Secondary | ICD-10-CM | POA: Diagnosis present

## 2017-01-13 DIAGNOSIS — J9621 Acute and chronic respiratory failure with hypoxia: Secondary | ICD-10-CM | POA: Diagnosis present

## 2017-01-13 DIAGNOSIS — K5730 Diverticulosis of large intestine without perforation or abscess without bleeding: Secondary | ICD-10-CM | POA: Diagnosis present

## 2017-01-13 DIAGNOSIS — Z79899 Other long term (current) drug therapy: Secondary | ICD-10-CM

## 2017-01-13 DIAGNOSIS — K5900 Constipation, unspecified: Secondary | ICD-10-CM | POA: Diagnosis present

## 2017-01-13 LAB — I-STAT VENOUS BLOOD GAS, ED
ACID-BASE DEFICIT: 1 mmol/L (ref 0.0–2.0)
Bicarbonate: 23.9 mmol/L (ref 20.0–28.0)
O2 SAT: 58 %
PCO2 VEN: 41.1 mmHg — AB (ref 44.0–60.0)
PO2 VEN: 31 mmHg — AB (ref 32.0–45.0)
TCO2: 25 mmol/L (ref 22–32)
pH, Ven: 7.374 (ref 7.250–7.430)

## 2017-01-13 LAB — URINALYSIS, ROUTINE W REFLEX MICROSCOPIC
BILIRUBIN URINE: NEGATIVE
GLUCOSE, UA: 50 mg/dL — AB
Hgb urine dipstick: NEGATIVE
KETONES UR: 5 mg/dL — AB
Nitrite: NEGATIVE
PH: 5 (ref 5.0–8.0)
PROTEIN: 30 mg/dL — AB
Specific Gravity, Urine: 1.015 (ref 1.005–1.030)

## 2017-01-13 LAB — COMPREHENSIVE METABOLIC PANEL
ALK PHOS: 85 U/L (ref 38–126)
ALT: 14 U/L (ref 14–54)
ANION GAP: 10 (ref 5–15)
AST: 19 U/L (ref 15–41)
Albumin: 4 g/dL (ref 3.5–5.0)
BILIRUBIN TOTAL: 0.6 mg/dL (ref 0.3–1.2)
BUN: 25 mg/dL — ABNORMAL HIGH (ref 6–20)
CHLORIDE: 102 mmol/L (ref 101–111)
CO2: 25 mmol/L (ref 22–32)
Calcium: 8.8 mg/dL — ABNORMAL LOW (ref 8.9–10.3)
Creatinine, Ser: 1.62 mg/dL — ABNORMAL HIGH (ref 0.44–1.00)
GFR calc non Af Amer: 30 mL/min — ABNORMAL LOW (ref >60)
GFR, EST AFRICAN AMERICAN: 34 mL/min — AB (ref >60)
GLUCOSE: 239 mg/dL — AB (ref 65–99)
POTASSIUM: 3.6 mmol/L (ref 3.5–5.1)
Sodium: 137 mmol/L (ref 135–145)
Total Protein: 8.2 g/dL — ABNORMAL HIGH (ref 6.5–8.1)

## 2017-01-13 LAB — BRAIN NATRIURETIC PEPTIDE: B NATRIURETIC PEPTIDE 5: 450 pg/mL — AB (ref 0.0–100.0)

## 2017-01-13 LAB — I-STAT TROPONIN, ED: Troponin i, poc: 0 ng/mL (ref 0.00–0.08)

## 2017-01-13 LAB — CBC
HEMATOCRIT: 39.2 % (ref 36.0–46.0)
HEMOGLOBIN: 12.8 g/dL (ref 12.0–15.0)
MCH: 28.4 pg (ref 26.0–34.0)
MCHC: 32.7 g/dL (ref 30.0–36.0)
MCV: 87.1 fL (ref 78.0–100.0)
Platelets: 186 10*3/uL (ref 150–400)
RBC: 4.5 MIL/uL (ref 3.87–5.11)
RDW: 14.5 % (ref 11.5–15.5)
WBC: 9.8 10*3/uL (ref 4.0–10.5)

## 2017-01-13 LAB — LIPASE, BLOOD: Lipase: 21 U/L (ref 11–51)

## 2017-01-13 LAB — CBG MONITORING, ED: Glucose-Capillary: 213 mg/dL — ABNORMAL HIGH (ref 65–99)

## 2017-01-13 MED ORDER — CALCITRIOL 0.25 MCG PO CAPS
0.2500 ug | ORAL_CAPSULE | ORAL | Status: DC
  Administered 2017-01-15: 0.25 ug via ORAL
  Filled 2017-01-13: qty 1

## 2017-01-13 MED ORDER — INSULIN DETEMIR 100 UNIT/ML ~~LOC~~ SOLN
22.0000 [IU] | Freq: Every day | SUBCUTANEOUS | Status: DC
Start: 2017-01-13 — End: 2017-01-16
  Administered 2017-01-13 – 2017-01-15 (×3): 22 [IU] via SUBCUTANEOUS
  Filled 2017-01-13 (×3): qty 0.22

## 2017-01-13 MED ORDER — INSULIN ASPART 100 UNIT/ML ~~LOC~~ SOLN: 0.0000 [IU] | Freq: Every day | SUBCUTANEOUS | Status: DC

## 2017-01-13 MED ORDER — POLYETHYLENE GLYCOL 3350 17 G PO PACK
17.0000 g | PACK | Freq: Every day | ORAL | Status: DC
  Administered 2017-01-13: 17 g via ORAL
  Filled 2017-01-13: qty 1

## 2017-01-13 MED ORDER — ACETAMINOPHEN 325 MG PO TABS: 650.0000 mg | ORAL_TABLET | ORAL | Status: DC | PRN

## 2017-01-13 MED ORDER — IPRATROPIUM-ALBUTEROL 0.5-2.5 (3) MG/3ML IN SOLN
RESPIRATORY_TRACT | Status: AC
  Administered 2017-01-13: 3 mL
  Filled 2017-01-13: qty 3

## 2017-01-13 MED ORDER — BISACODYL 5 MG PO TBEC: 5.0000 mg | DELAYED_RELEASE_TABLET | Freq: Every day | ORAL | Status: DC | PRN

## 2017-01-13 MED ORDER — ENOXAPARIN SODIUM 30 MG/0.3ML ~~LOC~~ SOLN
30.0000 mg | SUBCUTANEOUS | Status: DC
  Administered 2017-01-13 – 2017-01-14 (×2): 30 mg via SUBCUTANEOUS
  Filled 2017-01-13 (×2): qty 0.3

## 2017-01-13 MED ORDER — IOPAMIDOL (ISOVUE-370) INJECTION 76%
INTRAVENOUS | Status: AC
  Administered 2017-01-13: 50 mL
  Filled 2017-01-13: qty 100

## 2017-01-13 MED ORDER — FUROSEMIDE 10 MG/ML IJ SOLN
40.0000 mg | Freq: Two times a day (BID) | INTRAMUSCULAR | Status: DC
  Administered 2017-01-13 – 2017-01-16 (×6): 40 mg via INTRAVENOUS
  Filled 2017-01-13 (×6): qty 4

## 2017-01-13 MED ORDER — GABAPENTIN 100 MG PO CAPS
200.0000 mg | ORAL_CAPSULE | Freq: Every day | ORAL | Status: DC
  Administered 2017-01-13 – 2017-01-15 (×3): 200 mg via ORAL
  Filled 2017-01-13 (×3): qty 2

## 2017-01-13 MED ORDER — ONDANSETRON HCL 4 MG/2ML IJ SOLN: 4.0000 mg | Freq: Four times a day (QID) | INTRAMUSCULAR | Status: DC | PRN

## 2017-01-13 MED ORDER — GABAPENTIN 100 MG PO CAPS
100.0000 mg | ORAL_CAPSULE | Freq: Every morning | ORAL | Status: DC
  Administered 2017-01-14 – 2017-01-16 (×3): 100 mg via ORAL
  Filled 2017-01-13 (×3): qty 1

## 2017-01-13 MED ORDER — ASPIRIN EC 81 MG PO TBEC
81.0000 mg | DELAYED_RELEASE_TABLET | Freq: Every day | ORAL | Status: DC
  Administered 2017-01-14 – 2017-01-15 (×2): 81 mg via ORAL
  Filled 2017-01-13 (×2): qty 1

## 2017-01-13 MED ORDER — SODIUM CHLORIDE 0.9% FLUSH
3.0000 mL | Freq: Two times a day (BID) | INTRAVENOUS | Status: DC
  Administered 2017-01-13 – 2017-01-16 (×6): 3 mL via INTRAVENOUS

## 2017-01-13 MED ORDER — SODIUM CHLORIDE 0.9 % IV SOLN: 250.0000 mL | INTRAVENOUS | Status: DC | PRN

## 2017-01-13 MED ORDER — POLYETHYLENE GLYCOL 3350 17 G PO PACK: 17.0000 g | PACK | Freq: Every day | ORAL | Status: DC

## 2017-01-13 MED ORDER — SODIUM CHLORIDE 0.9% FLUSH: 3.0000 mL | INTRAVENOUS | Status: DC | PRN

## 2017-01-13 MED ORDER — SENNA 8.6 MG PO TABS
1.0000 | ORAL_TABLET | Freq: Once | ORAL | Status: AC
  Administered 2017-01-13: 8.6 mg via ORAL
  Filled 2017-01-13: qty 1

## 2017-01-13 MED ORDER — PANTOPRAZOLE SODIUM 40 MG PO TBEC
40.0000 mg | DELAYED_RELEASE_TABLET | Freq: Every day | ORAL | Status: DC
Start: 2017-01-14 — End: 2017-01-16
  Administered 2017-01-14 – 2017-01-16 (×3): 40 mg via ORAL
  Filled 2017-01-13 (×3): qty 1

## 2017-01-13 MED ORDER — CLONIDINE HCL 0.3 MG PO TABS
0.3000 mg | ORAL_TABLET | Freq: Two times a day (BID) | ORAL | Status: DC
  Administered 2017-01-13 – 2017-01-16 (×6): 0.3 mg via ORAL
  Filled 2017-01-13 (×6): qty 1

## 2017-01-13 MED ORDER — PSYLLIUM 95 % PO PACK
1.0000 | PACK | Freq: Three times a day (TID) | ORAL | Status: DC
  Administered 2017-01-13 – 2017-01-16 (×8): 1 via ORAL
  Filled 2017-01-13 (×8): qty 1

## 2017-01-13 MED ORDER — SIMVASTATIN 20 MG PO TABS
20.0000 mg | ORAL_TABLET | Freq: Every day | ORAL | Status: DC
  Administered 2017-01-13 – 2017-01-15 (×3): 20 mg via ORAL
  Filled 2017-01-13 (×4): qty 1

## 2017-01-13 MED ORDER — CARVEDILOL 6.25 MG PO TABS
6.2500 mg | ORAL_TABLET | Freq: Two times a day (BID) | ORAL | Status: DC
  Administered 2017-01-13 – 2017-01-16 (×6): 6.25 mg via ORAL
  Filled 2017-01-13 (×6): qty 1

## 2017-01-13 MED ORDER — INSULIN ASPART 100 UNIT/ML ~~LOC~~ SOLN
0.0000 [IU] | Freq: Three times a day (TID) | SUBCUTANEOUS | Status: DC
  Administered 2017-01-14: 8 [IU] via SUBCUTANEOUS
  Administered 2017-01-15 – 2017-01-16 (×4): 3 [IU] via SUBCUTANEOUS

## 2017-01-13 MED ORDER — ONDANSETRON HCL 4 MG/2ML IJ SOLN
4.0000 mg | Freq: Once | INTRAMUSCULAR | Status: AC
  Administered 2017-01-13: 4 mg via INTRAVENOUS
  Filled 2017-01-13: qty 2

## 2017-01-13 NOTE — ED Notes (Signed)
Pt placed on 2L [place] due to O2 sat of 85%

## 2017-01-13 NOTE — H&P (Signed)
History and Physical    Laura Best HKV:[id number] DOB: 1940-06-08 DOA: 01/13/2017  PCP: Orlena Sheldon, PA-C Consultants:  York Ram - cardiology; Lowanda Foster - nephrology Patient coming from:  Home - lives with son; Donald Prose: son, [phone]  Chief Complaint: abdominal pain  HPI: Laura Best is a 76 y.o. female with medical history significant of DM; HTN; HLD; diastolic heart failure; stage 3 CKD; and COPD presenting with abdominal pain.  She reports coming to the ER for "My breathing, then my heart was hurting me a little bit, and my stomach.  And my feet and legs were swollen up a little bit.  I think I got fluid in my body, my face and feet swell, that's how I can tell".  She has been having chronic swelling and has been told to stay off her feet.  SOB has been ongoing for a long time.  SOB is with exertion.  She has difficulty finishing washing the dishes, can't sweep the floor.  4 pillow orthopnea to help her breathe better.  Occasional PND, which is worse recently.  She does feel like she has gained weight but is unable to quantify it.  Some cough today with posttussive emesis.  Today sputum is yellowish.  Her mouth is dry and she feels like she has salt on her lips.  No fevers.  No sick contacts.  ED Course: Presented with abdominal pain - constipation.  Found to be hypoxic and tachypneic to 40s.  85% on room air with ambulation.  Does not usually wear home O2.  Minimally elevated BNP.  Mild wheezing.    Review of Systems: As per HPI; otherwise review of systems reviewed and negative.   Ambulatory Status:  Ambulates without assistance or with a walker  Past Medical History:  Diagnosis Date  . Arthritis    "hands, feet" (02/17/2016)  . CKD (chronic kidney disease) stage 3, GFR 30-59 ml/min (HCC) 12/25/2012  . COPD (chronic obstructive pulmonary disease) ([place])   . Diastolic heart failure ([place])   . Hyperlipidemia   . Hypertension   . Osteoporosis   . Pneumonia    "several  times" (02/17/2016)  . Small bowel obstruction ([place]) 02/17/2016  . Tricuspid regurgitation    Echo 05/02/06-Nml LV. Mild MR. Mod TR  . Type II diabetes mellitus ([place])   . Vitamin D deficiency     Past Surgical History:  Procedure Laterality Date  . APPENDECTOMY    . HEART CHAMBER REVISION     patched hole --[place]  . LAPAROSCOPIC APPENDECTOMY N/A 09/27/2013   Procedure: APPENDECTOMY - CONVERTED FROM LAPAROSCOPIC;  Surgeon: Donnie Mesa, MD;  Location: [facility];  Service: General;  Laterality: N/A;  . RIGHT/LEFT HEART CATH AND CORONARY ANGIOGRAPHY N/A 05/24/2016   Procedure: Right/Left Heart Cath and Coronary Angiography;  Surgeon: Larey Dresser, MD;  Location: [facility];  Service: Cardiovascular;  Laterality: N/A;  . TUBAL LIGATION      Social History   Socioeconomic History  . Marital status: Widowed    Spouse name: Not on file  . Number of children: Not on file  . Years of education: Not on file  . Highest education level: Not on file  Social Needs  . Financial resource strain: Not on file  . Food insecurity - worry: Not on file  . Food insecurity - inability: Not on file  . Transportation needs - medical: Not on file  . Transportation needs - non-medical: Not on file  Occupational History  . Occupation: retired  Tobacco Use  . Smoking status: Former Smoker    Packs/day: 0.50    Years: 50.00    Pack years: 25.00    Types: Cigarettes    Last attempt to quit: 01/19/2009    Years since quitting: 7.9  . Smokeless tobacco: Never Used  Substance and Sexual Activity  . Alcohol use: No    Alcohol/week: 0.0 oz  . Drug use: No  . Sexual activity: No  Other Topics Concern  . Not on file  Social History Narrative   Entered 10/2013:   She has never driven.   She lives with her son.    Allergies  Allergen Reactions  . Ace Inhibitors Other (See Comments)    Hyperkalemia  . Actos [Pioglitazone] Swelling  . Aspirin Other (See Comments)    G.I. Upset. In high  doses  . Metformin And Related Diarrhea    Family History  Problem Relation Age of Onset  . Cancer Father   . Diabetes Father   . Cancer Brother        Brain  . Cancer Sister        abdominal fat    Prior to Admission medications   Medication Sig Start Date End Date Taking? Authorizing Provider  acetaminophen (TYLENOL) 500 MG tablet Take 500 mg by mouth every 6 (six) hours as needed for pain.   Yes [provider]  amLODipine (NORVASC) 10 MG tablet TAKE ONE TABLET BY MOUTH ONCE DAILY Patient taking differently: TAKE ONE TABLET (10MG) BY MOUTH ONCE DAILY 07/12/16  Yes Lendon Colonel, NP  aspirin EC 81 MG tablet Take 81 mg by mouth daily.   Yes [provider]  bisacodyl (DULCOLAX) 5 MG EC tablet Take 5 mg by mouth daily as needed for mild constipation or moderate constipation.   Yes [provider]  calcitRIOL (ROCALTROL) 0.25 MCG capsule Take 0.25 mcg by mouth every Monday, Wednesday, and Friday.  04/19/16  Yes [provider]  carvedilol (COREG) 6.25 MG tablet TAKE 1 TABLET BY MOUTH TWICE DAILY Patient taking differently: TAKE 1 TABLET (6.25 MG) BY MOUTH TWICE DAILY 12/13/16  Yes Larey Dresser, MD  Cholecalciferol (VITAMIN D) 2000 UNITS CAPS Take 2,000 Units by mouth daily.    Yes [provider]  cloNIDine (CATAPRES) 0.3 MG tablet Take 1 tablet (0.3 mg total) by mouth 2 (two) times daily. 10/16/16  Yes Larey Dresser, MD  furosemide (LASIX) 40 MG tablet TAKE 1 TABLET BY MOUTH ONCE DAILY Patient taking differently: TAKE 1 TABLET (40MG) BY MOUTH ONCE DAILY 11/07/16  Yes Dena Billet B, PA-C  gabapentin (NEURONTIN) 100 MG capsule TAKE 1 TO 2 CAPSULES BY MOUTH AT BEDTIME FOR  PERIPHERAL  NEUROPATHY. Patient taking differently: TAKE 1 CAPSULES (100 MG) BY MOUTH IN THE MORNING FOR  PERIPHERAL  NEUROPATHY. 01/10/17  Yes Dena Billet B, PA-C  insulin aspart (NOVOLOG) 100 UNIT/ML FlexPen Take 7 units with luch and supper.  Inject 15 minutes  prior to meal or immediately after meal. 01/21/16  Yes Orlena Sheldon, PA-C  LEVEMIR FLEXTOUCH 100 UNIT/ML Pen INJECT 22 UNITS SUBCUTANEOUSLY ONCE DAILY AT 10 PM 11/07/16  Yes Dena Billet B, PA-C  omeprazole (PRILOSEC) 20 MG capsule TAKE ONE CAPSULE BY MOUTH ONCE DAILY Patient taking differently: TAKE ONE CAPSULE (20 MG) BY MOUTH ONCE DAILY 09/06/16  Yes Dena Billet B, PA-C  polyethylene glycol (MIRALAX / GLYCOLAX) packet Take 17 g by mouth daily. Patient taking  differently: Take 17 g by mouth daily as needed for mild constipation.  02/20/16  Yes Lavina Hamman, MD  psyllium (HYDROCIL/METAMUCIL) 95 % PACK Take 1 packet by mouth 3 (three) times daily. 02/19/16  Yes Lavina Hamman, MD  simvastatin (ZOCOR) 20 MG tablet TAKE ONE TABLET BY MOUTH ONCE DAILY AT BEDTIME Patient taking differently: TAKE ONE TABLET (20MG) BY MOUTH ONCE DAILY AT BEDTIME 06/08/16  Yes Dena Billet B, PA-C  Blood Glucose Monitoring Suppl (ONE TOUCH ULTRA SYSTEM KIT) w/Device KIT 1 meter 02/11/16   Dixon, Mary B, PA-C  glucose blood test strip Use as directed. Check blood sugar 3 times daily before meals and at bedtime. 11/14/16   Orlena Sheldon, PA-C  Lancets [facility] ULTRASOFT) lancets Use as instructed 02/11/16   Orlena Sheldon, PA-C  RELION SHORT PEN NEEDLES 31G X 8 MM MISC USE ONE PEN NEEDLE ONCE DAILY AS DIRECTED 03/10/14   Orlena Sheldon, PA-C    Physical Exam: Vitals:   01/13/17 1800 01/13/17 1900 01/13/17 2014 01/14/17 0004  BP: (!) 137/96 (!) 167/83 (!) 146/78 125/65  Pulse: 61 (!) 103 82 88  Resp: 17 (!) 33 (!) 22 20  Temp:   98.8 F (37.1 C) 98.5 F (36.9 C)  TempSrc:   Oral Oral  SpO2: 97% 92% 92% 98%  Weight:   77.2 kg (170 lb 4.8 oz)   Height:   _0  (1.6 m)      General:  Appears calm and comfortable and is NAD; she is somewhat disheveled Eyes:  PERRL, EOMI, normal lids, iris ENT:  grossly normal hearing, lips & tongue, mmm; poor dentition Neck:  no LAD, masses or thyromegaly; no carotid  bruits Cardiovascular:  RRR, 3-5/5 systolic murmur, no r/g. Trace LE edema.  Respiratory:   CTA bilaterally with no wheezes/rales/rhonchi.  Normal respiratory effort. Abdomen:  soft, NT, ND, NABS Back:   normal alignment, no CVAT Skin:  no rash or induration seen on limited exam Musculoskeletal:  grossly normal tone BUE/BLE, good ROM, no bony abnormality Lower extremity:  No LE edema.  Limited foot exam with no ulcerations.  2+ distal pulses. Psychiatric:  grossly normal mood and affect, speech fluent and appropriate, AOx3 Neurologic:  CN 2-12 grossly intact, moves all extremities in coordinated fashion, sensation intact   Radiological Exams on Admission: Dg Chest 2 View  Result Date: 01/13/2017 CLINICAL DATA:  Generalized abdominal pain, shortness of Breath EXAM: CHEST  2 VIEW COMPARISON:  04/05/2016 FINDINGS: Cardiomegaly. Prior CABG. No confluent airspace opacities or effusions. No acute bony abnormality. IMPRESSION: Cardiomegaly.  No active disease. Electronically Signed   By: Rolm Baptise M.D.   On: 01/13/2017 16:45   Ct Angio Chest Pe W And/or Wo Contrast  Result Date: 01/13/2017 CLINICAL DATA:  76 year old female with shortness of breath and elevated D-dimer EXAM: CT ANGIOGRAPHY CHEST WITH CONTRAST TECHNIQUE: Multidetector CT imaging of the chest was performed using the standard protocol during bolus administration of intravenous contrast. Multiplanar CT image reconstructions and MIPs were obtained to evaluate the vascular anatomy. CONTRAST:  61m ISOVUE-370 IOPAMIDOL (ISOVUE-370) INJECTION 76% COMPARISON:  Chest x-ray obtained earlier today FINDINGS: Cardiovascular: Adequate opacification of the pulmonary arteries to the proximal subsegmental level. There is no evidence of acute pulmonary embolus. Cardiomegaly with right-sided heart enlargement. Atherosclerotic calcifications present throughout the coronary arteries. Normal caliber thoracic aorta. Atherosclerotic calcifications present  in the aorta. No pericardial effusion. Mediastinum/Nodes: 2.3 cm nodule in the inferior aspect of the left thyroid gland. No  suspicious mediastinal lymphadenopathy or mediastinal mass. Tiny hiatal hernia. Lungs/Pleura: No focal airspace consolidation. There are scattered areas of subtle ground-glass attenuation airspace opacity in a peribronchovascular distribution in the right upper, middle and lower lobes. Trace dependent atelectasis. No suspicious pulmonary mass or nodule. Upper Abdomen: Visualized upper abdominal organs are unremarkable. Musculoskeletal: No acute fracture or aggressive appearing lytic or blastic osseous lesion. Healed median sternotomy. Review of the MIP images confirms the above findings. IMPRESSION: 1. Negative for acute pulmonary embolus. 2. Nonspecific subtle multifocal patches of ground-glass attenuation airspace opacity throughout the right lung. Findings appear to be in a peribronchovascular distribution. This is favored to reflect an atypical or viral upper respiratory infection. Additional considerations include early pneumonitis/ alveolitis, and potentially very early asymmetric pulmonary edema. 3. Incidentally noted 2.3 cm left inferior thyroid nodule. Recommend further evaluation with non urgent thyroid ultrasound. 4. Cardiomegaly with right-sided heart enlargement. 5. Coronary artery calcifications. Aortic Atherosclerosis (ICD10-I70.0). Electronically Signed   By: Jacqulynn Cadet M.D.   On: 01/13/2017 19:52    EKG: Independently reviewed.  Afib with rate 113; nonspecific ST changes with no evidence of acute ischemia   Labs on Admission: I have personally reviewed the available labs and imaging studies at the time of the admission.  Pertinent labs:   Glucose 239 BUN 25/Creatinine 1.62/GFR 34 Normal CBC BNP 450; prior 327.7 on 4/24 VBG: 7.374/41.02/22/22  Assessment/Plan Principal Problem:   Acute on chronic respiratory failure with hypoxia (HCC) Active Problems:    Essential hypertension   Hyperlipidemia   Abdominal pain   CKD (chronic kidney disease) stage 3, GFR 30-59 ml/min (HCC)   Diabetes mellitus with complication (HCC)   Diastolic CHF, acute on chronic (HCC)   Acute on chronic respiratory failure with hypoxia, thought to be related to CHF exacerbation -Patient reports h/o weight gain, orthopnea, PND, and diffuse edema -CXR with cardiomegaly but not pulmonary edema -Normal WBC count, no wheezing, not particularly c/w COPD exacerbation despite h/o COPD -Elevated BNP but not markedly so -CT obtained to evaluate for PE and appears to agree with diagnosis of mild CHF exacerbation - shows right-sided cardiac enlargement and possible very early asymmetric pulmonary edema -With elevated BNP and abnl CT, CHF most probable as diagnosis and cause for hypoxia -Will place in observation status with telemetry -Will request repeat echocardiogram - last done in 3/18 and showed preserved EF but grade 2 diastolic dysfunction -Will continue ASA -No ACE due to renal dysfunction -Continue Coreg -CHF order set utilized; may need CHF team consult but will hold until Echo results are available -Will start Lasix 40 mg IV BID -Continue [place] O2 for now -Stable kidney function at this time, will follow -Repeat EKG in AM -Hold Norvasc at this time and consider discontinuation due to risk of edema as side effect  HTN -Hold Norvasc and consider discontinuation -Continue Coreg and Catapres -Patient with suboptimal control while in the ER -Cannot take ACE due to renal dysfunction -Will add prn IV hydralazine  HLD -Continue Zocor -Lipids were checked in 8/18 (TC 162, HDL 64, LDL 56, TG 208) so will not repeat at this time  DM -Glucose 239 -Last A1c was 6.9 earlier this month, will not recheck -Continue Levemir -Cover with moderate-scale SSI   CKD -Appears to be stable and actually better than baseline -Will follow -Continue calcitriol  Abdominal  pain -Recent CT (12/17) showed constipation -As per CT tech today, there was remaining contrast visualized on today's chest CT -While this may indicate an SBO,  the working diagnosis is ongoing moderate to severe constipation and medications have been ordered to assist the patient in clearing this stool -If ineffective, she may need an enema, manual disimpaction, and/or further imaging   *Note: incidental thyroid nodule noted on CT; will need outpatient thyroid ultrasound.  Normal TSH in 8/18.   DVT prophylaxis:  Lovenox  Code Status: DNR - confirmed with patient/family Family Communication: Daughter was present at the completion of our encounter   Disposition Plan:  Home once clinically improved Consults called: Case management/PT/OT Admission status: It is my clinical opinion that referral for OBSERVATION is reasonable and necessary in this patient based on the above information provided. The aforementioned taken together are felt to place the patient at high risk for further clinical deterioration. However it is anticipated that the patient may be medically stable for discharge from the hospital within 24 to 48 hours.    Karmen Bongo MD [facility]  If note is complete, please contact covering daytime or nighttime physician. [email or web address] Password [email or web address]  01/14/2017, 12:10 AM

## 2017-01-13 NOTE — ED Triage Notes (Signed)
Pt states she has been having abdominal pain for several weeks. She repots going to have a CT scan on Monday but was unable to get results. Pt states LBM was Monday. Also reports abdominal bloating.

## 2017-01-13 NOTE — ED Notes (Signed)
Called lab due to no results at this time.  They state labs just received in at 1406 and will run the labs.

## 2017-01-13 NOTE — ED Provider Notes (Signed)
[facility] EMERGENCY DEPARTMENT Provider Note   CSN: [id number] Arrival date & time: 01/13/17  1214     History   Chief Complaint Chief Complaint  Patient presents with  . Abdominal Pain    HPI Laura Best is a 76 y.o. female.  The history is provided by the patient and medical records.  Abdominal Pain   This is a chronic problem. Episode onset: Thanksgiving. Episode frequency: intermittently. The problem has not changed since onset.The pain is associated with a previous surgery. The pain is located in the LLQ. The quality of the pain is aching. The pain is moderate. Associated symptoms include constipation. Pertinent negatives include fever, vomiting, dysuria and arthralgias. Nothing aggravates the symptoms. Nothing relieves the symptoms. Past workup includes CT scan.    Past Medical History:  Diagnosis Date  . Arthritis    "hands, feet" (02/17/2016)  . CHF (congestive heart failure) ([place])   . CKD (chronic kidney disease) stage 3, GFR 30-59 ml/min (HCC) 12/25/2012  . COPD (chronic obstructive pulmonary disease) ([place])   . Diastolic heart failure ([place])   . Hyperlipidemia   . Hypertension   . Osteoporosis   . Osteoporosis   . Pneumonia    "several times" (02/17/2016)  . Small bowel obstruction ([place]) 02/17/2016  . Tricuspid regurgitation    Echo 05/02/06-Nml LV. Mild MR. Mod TR  . Type II diabetes mellitus ([place])   . Vitamin D deficiency     Patient Active Problem List   Diagnosis Date Noted  . Diabetic peripheral neuropathy associated with type 2 diabetes mellitus ([place]) 05/11/2016  . PAH (pulmonary artery hypertension) ([place])   . History of atrial septal defect repair   . Leukocytosis 04/06/2016  . Diastolic CHF, acute on chronic (HCC) 04/05/2016  . SBO (small bowel obstruction) ([place]) 02/17/2016  . Chronic diastolic congestive heart failure ([place]) 02/17/2016  . Diabetes mellitus with complication ([place]) 19/37/9024  . Pyuria 02/17/2016  . GERD  (gastroesophageal reflux disease) 04/05/2015  . Diastolic dysfunction 09/73/5329  . Acute diverticulitis 05/03/2014  . Acute appendicitis with peritoneal abscess 09/27/2013  . Bradycardia 04/02/2013  . Hyperkalemia 01/22/2013  . Acute renal failure ([place]) 01/21/2013  . Anemia of chronic disease 01/21/2013  . Acute bronchitis 01/20/2013  . CHF exacerbation ([place]) 01/19/2013  . CKD (chronic kidney disease) stage 3, GFR 30-59 ml/min (HCC) 12/25/2012  . Screening for colorectal cancer 12/25/2012  . Breast cancer screening 12/25/2012  . Vitamin D deficiency   . Osteoporosis   . Postop check 08/13/2012  . Appendiceal abscess 07/30/2012  . Fever, unspecified 07/19/2012  . Abdominal pain, other specified site 07/19/2012  . SOB (shortness of breath) 07/19/2012  . ARF (acute renal failure) ([place]) 07/19/2012  . Abscess, appendix 07/19/2012  . Acute diastolic heart failure ([place]) 02/04/2011  . Type 2 diabetes mellitus with renal manifestations ([place]) 02/02/2011  . Pulmonary hypertension ([place]) 02/02/2011  . COPD exacerbation ([place]) 02/02/2011  . Hypokalemia 02/02/2011  . Normocytic anemia 02/02/2011  . Essential hypertension   . Hyperlipidemia     Past Surgical History:  Procedure Laterality Date  . APPENDECTOMY    . HEART CHAMBER REVISION     patched hole --[place]  . LAPAROSCOPIC APPENDECTOMY N/A 09/27/2013   Procedure: APPENDECTOMY - CONVERTED FROM LAPAROSCOPIC;  Surgeon: Donnie Mesa, MD;  Location: [facility];  Service: General;  Laterality: N/A;  . RIGHT/LEFT HEART CATH AND CORONARY ANGIOGRAPHY N/A 05/24/2016   Procedure: Right/Left Heart Cath and Coronary Angiography;  Surgeon: Kirk Ruths  Claris Gladden, MD;  Location: [facility];  Service: Cardiovascular;  Laterality: N/A;  . TUBAL LIGATION      OB History    No data available       Home Medications    Prior to Admission medications   Medication Sig Start Date End Date Taking? Authorizing Provider  acetaminophen (TYLENOL) 500 MG  tablet Take 500 mg by mouth every 6 (six) hours as needed for pain.    [provider]  amLODipine (NORVASC) 10 MG tablet TAKE ONE TABLET BY MOUTH ONCE DAILY 07/12/16   Lendon Colonel, NP  aspirin EC 81 MG tablet Take 81 mg by mouth daily.    [provider]  Blood Glucose Monitoring Suppl (ONE TOUCH ULTRA SYSTEM KIT) w/Device KIT 1 meter 02/11/16   Dena Billet B, PA-C  calcitRIOL (ROCALTROL) 0.25 MCG capsule Take 0.25 mcg by mouth every Monday, Wednesday, and Friday.  04/19/16   [provider]  carvedilol (COREG) 6.25 MG tablet TAKE 1 TABLET BY MOUTH TWICE DAILY 12/13/16   Larey Dresser, MD  Cholecalciferol (VITAMIN D) 2000 UNITS CAPS Take 2,000 Units by mouth daily.     [provider]  cloNIDine (CATAPRES) 0.3 MG tablet Take 1 tablet (0.3 mg total) by mouth 2 (two) times daily. 10/16/16   Larey Dresser, MD  furosemide (LASIX) 40 MG tablet TAKE 1 TABLET BY MOUTH ONCE DAILY 11/07/16   Dena Billet B, PA-C  gabapentin (NEURONTIN) 100 MG capsule TAKE 1 TO 2 CAPSULES BY MOUTH AT BEDTIME FOR  PERIPHERAL  NEUROPATHY. 01/10/17   Dena Billet B, PA-C  glucose blood test strip Use as directed. Check blood sugar 3 times daily before meals and at bedtime. 11/14/16   Dena Billet B, PA-C  insulin aspart (NOVOLOG) 100 UNIT/ML FlexPen Take 7 units with luch and supper.  Inject 15 minutes prior to meal or immediately after meal. 01/21/16   Dixon, Stanton Kidney B, PA-C  Lancets [facility] ULTRASOFT) lancets Use as instructed 02/11/16   Orlena Sheldon, PA-C  LEVEMIR FLEXTOUCH 100 UNIT/ML Pen INJECT 22 UNITS SUBCUTANEOUSLY ONCE DAILY AT 10 PM 11/07/16   Dena Billet B, PA-C  omeprazole (PRILOSEC) 20 MG capsule TAKE ONE CAPSULE BY MOUTH ONCE DAILY 09/06/16   Dena Billet B, PA-C  polyethylene glycol (MIRALAX / GLYCOLAX) packet Take 17 g by mouth daily. 02/20/16   Lavina Hamman, MD  psyllium (HYDROCIL/METAMUCIL) 95 % PACK Take 1 packet by mouth 3 (three) times daily. 02/19/16   Lavina Hamman, MD  RELION SHORT PEN NEEDLES 31G X 8 MM MISC USE ONE PEN NEEDLE ONCE DAILY AS DIRECTED 03/10/14   Dena Billet B, PA-C  simvastatin (ZOCOR) 20 MG tablet TAKE ONE TABLET BY MOUTH ONCE DAILY AT BEDTIME 06/08/16   Orlena Sheldon, PA-C    Family History Family History  Problem Relation Age of Onset  . Cancer Father   . Diabetes Father   . Cancer Brother        Brain  . Cancer Sister        abdominal fat    Social History Social History   Tobacco Use  . Smoking status: Former Smoker    Packs/day: 0.50    Years: 50.00    Pack years: 25.00    Types: Cigarettes    Last attempt to quit: 01/19/2009    Years since quitting: 7.9  . Smokeless tobacco: Never Used  Substance Use Topics  . Alcohol use: No  Alcohol/week: 0.0 oz  . Drug use: No     Allergies   Ace inhibitors; Actos [pioglitazone]; Aspirin; and Metformin and related   Review of Systems Review of Systems  Constitutional: Negative for chills and fever.  HENT: Negative for rhinorrhea and sore throat.   Eyes: Negative for visual disturbance.  Respiratory: Positive for shortness of breath (chronic). Negative for cough.   Cardiovascular: Positive for chest pain (chronic intermittent). Negative for palpitations.  Gastrointestinal: Positive for abdominal pain and constipation. Negative for vomiting.  Genitourinary: Negative for dysuria.  Musculoskeletal: Positive for back pain (chronic lower back). Negative for arthralgias.  Skin: Negative for rash.  Neurological: Negative for syncope and light-headedness.  Psychiatric/Behavioral: Negative for confusion.  All other systems reviewed and are negative.    Physical Exam Updated Vital Signs BP (!) 177/73   Pulse (!) 101   Temp 98.8 F (37.1 C) (Oral)   Resp (!) 22   SpO2 90%   Physical Exam  Constitutional: She is oriented to person, place, and time. She appears well-developed and well-nourished. No distress.  HENT:  Head: Normocephalic and atraumatic.  Eyes:  Conjunctivae are normal. No scleral icterus.  Neck: Neck supple.  Cardiovascular:  No murmur heard. Tachycardic, AFib  Pulmonary/Chest: No respiratory distress.  Tachypnea, SaO2 88% RA, mild bibasilar crackles  Abdominal: Soft. Normal appearance. She exhibits distension. She exhibits no shifting dullness. There is tenderness in the left lower quadrant. There is no rigidity, no rebound and no guarding. No hernia.  Musculoskeletal: Normal range of motion. She exhibits no edema.  Neurological: She is alert and oriented to person, place, and time.  Skin: Skin is warm and dry. Capillary refill takes less than 2 seconds.  Psychiatric: She has a normal mood and affect. Her behavior is normal.  Nursing note and vitals reviewed.    ED Treatments / Results  Labs (all labs ordered are listed, but only abnormal results are displayed) Labs Reviewed  COMPREHENSIVE METABOLIC PANEL - Abnormal; Notable for the following components:      Result Value   Glucose, Bld 239 (*)    BUN 25 (*)    Creatinine, Ser 1.62 (*)    Calcium 8.8 (*)    Total Protein 8.2 (*)    GFR calc non Af Amer 30 (*)    GFR calc Af Amer 34 (*)    All other components within normal limits  CBG MONITORING, ED - Abnormal; Notable for the following components:   Glucose-Capillary 213 (*)    All other components within normal limits  LIPASE, BLOOD  CBC  URINALYSIS, ROUTINE W REFLEX MICROSCOPIC    EKG  EKG Interpretation None       Radiology No results found.  Procedures Procedures (including critical care time)  Medications Ordered in ED Medications - No data to display   Initial Impression / Assessment and Plan / ED Course  I have reviewed the triage vital signs and the nursing notes.  Pertinent labs & imaging results that were available during my care of the patient were reviewed by me and considered in my medical decision making (see chart for details).     Pt with h/o PHTN, DM2, CKD3, COPD, CAD, CHF,  Afib presents with abdominal pain. Seen at PCP for same on 12/5; described intermittent LLQ pain since Thanksgiving that has been associated w/a bitter taste in her mouth & constipation. She had labs & a CT scan performed for evaluation of the pain; scan showed no acute findings &  a large stool burden. Pt also says she had SOB (which she notes is long-standing) and intermittent CP when asked (she says she has them intermittently, and they are not any worse or more frequent than normal). Denies F/C, HA, lightheadedness, N/V/D, urinary symptoms.  VS & exam as above. EKG: Afib @ 113bpm w/similar morphology to tracing from 3/18. Initial labs remarkable for Crt 1.62 (w/in Pt normal range), troponin 0.00. VBG: 7.37 / 41 / 31 / 25.  SaO2 dropped to 85% on RA, so 2L [place] applied.  CXR w/cardiomegaly & no active disease. BNP 450.  Attempted to ambulate the Pt in the ED; after walking 121f on RA her RR increased to 40 w/SaO2 87%.  CTA chest w/o PE, but shows multifocal patches concerning for atypical/viral PNA.  Will admit the Pt to medicine for further evaluation and treatment.  Final Clinical Impressions(s) / ED Diagnoses   Final diagnoses:  Hypoxia    ED Discharge Orders    None       PJenny Reichmann MD 01/14/17 [id number]   ZElnora Morrison MD 01/14/17 1515

## 2017-01-13 NOTE — ED Notes (Signed)
Patient transported to X-ray 

## 2017-01-13 NOTE — ED Notes (Signed)
Pt ambulated approximately 132ft on room air with pulse ox. Pt saturation dropped to 87% and Respirations increased to 40. EPD informed

## 2017-01-14 ENCOUNTER — Inpatient Hospital Stay (HOSPITAL_COMMUNITY): Payer: Medicare HMO

## 2017-01-14 ENCOUNTER — Other Ambulatory Visit: Payer: Self-pay

## 2017-01-14 ENCOUNTER — Encounter (HOSPITAL_COMMUNITY): Payer: Self-pay | Admitting: *Deleted

## 2017-01-14 DIAGNOSIS — I340 Nonrheumatic mitral (valve) insufficiency: Secondary | ICD-10-CM

## 2017-01-14 DIAGNOSIS — Z7982 Long term (current) use of aspirin: Secondary | ICD-10-CM | POA: Diagnosis not present

## 2017-01-14 DIAGNOSIS — N183 Chronic kidney disease, stage 3 (moderate): Secondary | ICD-10-CM

## 2017-01-14 DIAGNOSIS — E1122 Type 2 diabetes mellitus with diabetic chronic kidney disease: Secondary | ICD-10-CM | POA: Diagnosis not present

## 2017-01-14 DIAGNOSIS — Z888 Allergy status to other drugs, medicaments and biological substances status: Secondary | ICD-10-CM | POA: Diagnosis not present

## 2017-01-14 DIAGNOSIS — Z66 Do not resuscitate: Secondary | ICD-10-CM | POA: Diagnosis present

## 2017-01-14 DIAGNOSIS — I272 Pulmonary hypertension, unspecified: Secondary | ICD-10-CM | POA: Diagnosis present

## 2017-01-14 DIAGNOSIS — I480 Paroxysmal atrial fibrillation: Secondary | ICD-10-CM | POA: Diagnosis present

## 2017-01-14 DIAGNOSIS — I481 Persistent atrial fibrillation: Secondary | ICD-10-CM | POA: Diagnosis not present

## 2017-01-14 DIAGNOSIS — R0902 Hypoxemia: Secondary | ICD-10-CM | POA: Diagnosis present

## 2017-01-14 DIAGNOSIS — Z794 Long term (current) use of insulin: Secondary | ICD-10-CM | POA: Diagnosis not present

## 2017-01-14 DIAGNOSIS — D638 Anemia in other chronic diseases classified elsewhere: Secondary | ICD-10-CM | POA: Diagnosis present

## 2017-01-14 DIAGNOSIS — K5900 Constipation, unspecified: Secondary | ICD-10-CM | POA: Diagnosis not present

## 2017-01-14 DIAGNOSIS — I130 Hypertensive heart and chronic kidney disease with heart failure and stage 1 through stage 4 chronic kidney disease, or unspecified chronic kidney disease: Secondary | ICD-10-CM | POA: Diagnosis not present

## 2017-01-14 DIAGNOSIS — I493 Ventricular premature depolarization: Secondary | ICD-10-CM | POA: Diagnosis present

## 2017-01-14 DIAGNOSIS — I2510 Atherosclerotic heart disease of native coronary artery without angina pectoris: Secondary | ICD-10-CM | POA: Diagnosis present

## 2017-01-14 DIAGNOSIS — I4891 Unspecified atrial fibrillation: Secondary | ICD-10-CM | POA: Diagnosis not present

## 2017-01-14 DIAGNOSIS — J9621 Acute and chronic respiratory failure with hypoxia: Secondary | ICD-10-CM | POA: Diagnosis not present

## 2017-01-14 DIAGNOSIS — N184 Chronic kidney disease, stage 4 (severe): Secondary | ICD-10-CM | POA: Diagnosis not present

## 2017-01-14 DIAGNOSIS — K5730 Diverticulosis of large intestine without perforation or abscess without bleeding: Secondary | ICD-10-CM | POA: Diagnosis not present

## 2017-01-14 DIAGNOSIS — M810 Age-related osteoporosis without current pathological fracture: Secondary | ICD-10-CM | POA: Diagnosis present

## 2017-01-14 DIAGNOSIS — Z87891 Personal history of nicotine dependence: Secondary | ICD-10-CM | POA: Diagnosis not present

## 2017-01-14 DIAGNOSIS — Z79899 Other long term (current) drug therapy: Secondary | ICD-10-CM | POA: Diagnosis not present

## 2017-01-14 DIAGNOSIS — I10 Essential (primary) hypertension: Secondary | ICD-10-CM | POA: Diagnosis not present

## 2017-01-14 DIAGNOSIS — E785 Hyperlipidemia, unspecified: Secondary | ICD-10-CM | POA: Diagnosis not present

## 2017-01-14 DIAGNOSIS — I5033 Acute on chronic diastolic (congestive) heart failure: Secondary | ICD-10-CM

## 2017-01-14 DIAGNOSIS — J449 Chronic obstructive pulmonary disease, unspecified: Secondary | ICD-10-CM | POA: Diagnosis not present

## 2017-01-14 DIAGNOSIS — Z886 Allergy status to analgesic agent status: Secondary | ICD-10-CM | POA: Diagnosis not present

## 2017-01-14 LAB — CBC WITH DIFFERENTIAL/PLATELET
BASOS ABS: 0 10*3/uL (ref 0.0–0.1)
BASOS PCT: 1 %
Eosinophils Absolute: 0.1 10*3/uL (ref 0.0–0.7)
Eosinophils Relative: 2 %
HEMATOCRIT: 34.6 % — AB (ref 36.0–46.0)
Hemoglobin: 11 g/dL — ABNORMAL LOW (ref 12.0–15.0)
LYMPHS PCT: 16 %
Lymphs Abs: 1.1 10*3/uL (ref 0.7–4.0)
MCH: 28.1 pg (ref 26.0–34.0)
MCHC: 31.8 g/dL (ref 30.0–36.0)
MCV: 88.5 fL (ref 78.0–100.0)
Monocytes Absolute: 0.6 10*3/uL (ref 0.1–1.0)
Monocytes Relative: 9 %
NEUTROS ABS: 4.9 10*3/uL (ref 1.7–7.7)
Neutrophils Relative %: 72 %
PLATELETS: 152 10*3/uL (ref 150–400)
RBC: 3.91 MIL/uL (ref 3.87–5.11)
RDW: 15 % (ref 11.5–15.5)
WBC: 6.7 10*3/uL (ref 4.0–10.5)

## 2017-01-14 LAB — BASIC METABOLIC PANEL
ANION GAP: 9 (ref 5–15)
BUN: 21 mg/dL — ABNORMAL HIGH (ref 6–20)
CALCIUM: 8.3 mg/dL — AB (ref 8.9–10.3)
CO2: 25 mmol/L (ref 22–32)
Chloride: 102 mmol/L (ref 101–111)
Creatinine, Ser: 1.52 mg/dL — ABNORMAL HIGH (ref 0.44–1.00)
GFR, EST AFRICAN AMERICAN: 37 mL/min — AB (ref >60)
GFR, EST NON AFRICAN AMERICAN: 32 mL/min — AB (ref >60)
GLUCOSE: 260 mg/dL — AB (ref 65–99)
POTASSIUM: 3.8 mmol/L (ref 3.5–5.1)
Sodium: 136 mmol/L (ref 135–145)

## 2017-01-14 LAB — GLUCOSE, CAPILLARY
GLUCOSE-CAPILLARY: 274 mg/dL — AB (ref 65–99)
GLUCOSE-CAPILLARY: 195 mg/dL — AB (ref 65–99)
Glucose-Capillary: 100 mg/dL — ABNORMAL HIGH (ref 65–99)

## 2017-01-14 LAB — TSH: TSH: 1.863 u[IU]/mL (ref 0.350–4.500)

## 2017-01-14 LAB — TROPONIN I
Troponin I: <0.03 ng/mL (ref <0.03)
Troponin I: <0.03 ng/mL (ref <0.03)

## 2017-01-14 MED ORDER — FLEET ENEMA 7-19 GM/118ML RE ENEM
1.0000 | ENEMA | Freq: Once | RECTAL | Status: AC
  Administered 2017-01-14: 1 via RECTAL
  Filled 2017-01-14: qty 1

## 2017-01-14 MED ORDER — HYDRALAZINE HCL 20 MG/ML IJ SOLN: 5.0000 mg | INTRAMUSCULAR | Status: DC | PRN

## 2017-01-14 MED ORDER — SENNA 8.6 MG PO TABS
1.0000 | ORAL_TABLET | Freq: Two times a day (BID) | ORAL | Status: DC
  Administered 2017-01-14 – 2017-01-16 (×5): 8.6 mg via ORAL
  Filled 2017-01-14 (×6): qty 1

## 2017-01-14 MED ORDER — POLYETHYLENE GLYCOL 3350 17 G PO PACK
17.0000 g | PACK | Freq: Two times a day (BID) | ORAL | Status: DC
  Administered 2017-01-14 – 2017-01-16 (×5): 17 g via ORAL
  Filled 2017-01-14 (×5): qty 1

## 2017-01-14 NOTE — Progress Notes (Signed)
[facility] PROGRESS NOTE  Laura Best VZD:[id number] DOB: [birth year] DOA: 01/13/2017  PCP: Laura Sheldon, PA-C  Brief History/Interval Summary: 76 year old Caucasian female with a past medical history of diabetes mellitus type 2, hypertension, diastolic heart failure, stage III chronic kidney disease, COPD presented with complaints of abdominal distention, shortness of breath, swelling of her legs.  She also mentioned orthopnea and PND.  She was thought to have acute on chronic diastolic CHF.  She was hospitalized for further management.  Reason for Visit: Acute diastolic CHF  Consultants: Consult cardiology  Procedures: Transthoracic echocardiogram is pending  Antibiotics: None  Subjective/Interval History: Patient states that she has been having on and off chest pain especially in her left side since hospitalization and even prior to that.  Denies any radiation of the pain.  Denies any nausea vomiting.  She continues to have shortness of breath although she feels slightly better compared to last night.  ROS: Denies any lightheadedness.  No headaches.  Objective:  Vital Signs  Vitals:   01/13/17 1900 01/13/17 2014 01/14/17 0004 01/14/17 0517  BP: (!) 167/83 (!) 146/78 125/65 (!) 123/51  Pulse: (!) 103 82 88 62  Resp: (!) 33 (!) 22 20 18   Temp:  98.8 F (37.1 C) 98.5 F (36.9 C) 97.9 F (36.6 C)  TempSrc:  Oral Oral Oral  SpO2: 92% 92% 98% 100%  Weight:  77.2 kg (170 lb 4.8 oz)  74.6 kg (164 lb 8 oz)  Height:  5\' 3"  (1.6 m)      Intake/Output Summary (Last 24 hours) at 01/14/2017 1110 Last data filed at 01/14/2017 2951 Gross per 24 hour  Intake 702 ml  Output 800 ml  Net -98 ml   Filed Weights   01/13/17 2014 01/14/17 0517  Weight: 77.2 kg (170 lb 4.8 oz) 74.6 kg (164 lb 8 oz)    General appearance: alert, cooperative, appears stated age and no distress Head: Normocephalic, without obvious abnormality, atraumatic Resp: Crackles noted bilaterally.   Mildly tachypneic.  No use of accessory muscles.  No wheezing. Cardio: S1-S2 noted to be irregularly irregular.  No S3-S4.  No rubs murmurs or bruit.  Pedal edema is appreciated. GI: soft, non-tender; bowel sounds normal; no masses,  no organomegaly Extremities: Edema is noted Neurologic: Awake and alert.  Mildly distracted.  No obvious focal neurological deficits.  Lab Results:  Data Reviewed: I have personally reviewed following labs and imaging studies  CBC: Recent Labs  Lab 01/13/17 1309 01/14/17 0018  WBC 9.8 6.7  NEUTROABS  --  4.9  HGB 12.8 11.0*  HCT 39.2 34.6*  MCV 87.1 88.5  PLT 186 884    Basic Metabolic Panel: Recent Labs  Lab 01/13/17 1309 01/14/17 0018  NA 137 136  K 3.6 3.8  CL 102 102  CO2 25 25  GLUCOSE 239* 260*  BUN 25* 21*  CREATININE 1.62* 1.52*  CALCIUM 8.8* 8.3*    GFR: Estimated Creatinine Clearance: 30.5 mL/min (A) (by C-G formula based on SCr of 1.52 mg/dL (H)).  Liver Function Tests: Recent Labs  Lab 01/13/17 1309  AST 19  ALT 14  ALKPHOS 85  BILITOT 0.6  PROT 8.2*  ALBUMIN 4.0    Recent Labs  Lab 01/13/17 1309  LIPASE 21    Cardiac Enzymes: Recent Labs  Lab 01/14/17 0841  TROPONINI <0.03     CBG: Recent Labs  Lab 01/13/17 1409 01/14/17 0739  GLUCAP 213* 100*    Thyroid Function Tests: Recent Labs  01/14/17 0841  TSH 1.863     Radiology Studies: Dg Chest 2 View  Result Date: 01/13/2017 CLINICAL DATA:  Generalized abdominal pain, shortness of Breath EXAM: CHEST  2 VIEW COMPARISON:  04/05/2016 FINDINGS: Cardiomegaly. Prior CABG. No confluent airspace opacities or effusions. No acute bony abnormality. IMPRESSION: Cardiomegaly.  No active disease. Electronically Signed   By: Rolm Baptise M.D.   On: 01/13/2017 16:45   Ct Angio Chest Pe W And/or Wo Contrast  Result Date: 01/13/2017 CLINICAL DATA:  76 year old female with shortness of breath and elevated D-dimer EXAM: CT ANGIOGRAPHY CHEST WITH CONTRAST  TECHNIQUE: Multidetector CT imaging of the chest was performed using the standard protocol during bolus administration of intravenous contrast. Multiplanar CT image reconstructions and MIPs were obtained to evaluate the vascular anatomy. CONTRAST:  89mL ISOVUE-370 IOPAMIDOL (ISOVUE-370) INJECTION 76% COMPARISON:  Chest x-ray obtained earlier today FINDINGS: Cardiovascular: Adequate opacification of the pulmonary arteries to the proximal subsegmental level. There is no evidence of acute pulmonary embolus. Cardiomegaly with right-sided heart enlargement. Atherosclerotic calcifications present throughout the coronary arteries. Normal caliber thoracic aorta. Atherosclerotic calcifications present in the aorta. No pericardial effusion. Mediastinum/Nodes: 2.3 cm nodule in the inferior aspect of the left thyroid gland. No suspicious mediastinal lymphadenopathy or mediastinal mass. Tiny hiatal hernia. Lungs/Pleura: No focal airspace consolidation. There are scattered areas of subtle ground-glass attenuation airspace opacity in a peribronchovascular distribution in the right upper, middle and lower lobes. Trace dependent atelectasis. No suspicious pulmonary mass or nodule. Upper Abdomen: Visualized upper abdominal organs are unremarkable. Musculoskeletal: No acute fracture or aggressive appearing lytic or blastic osseous lesion. Healed median sternotomy. Review of the MIP images confirms the above findings. IMPRESSION: 1. Negative for acute pulmonary embolus. 2. Nonspecific subtle multifocal patches of ground-glass attenuation airspace opacity throughout the right lung. Findings appear to be in a peribronchovascular distribution. This is favored to reflect an atypical or viral upper respiratory infection. Additional considerations include early pneumonitis/ alveolitis, and potentially very early asymmetric pulmonary edema. 3. Incidentally noted 2.3 cm left inferior thyroid nodule. Recommend further evaluation with non urgent  thyroid ultrasound. 4. Cardiomegaly with right-sided heart enlargement. 5. Coronary artery calcifications. Aortic Atherosclerosis (ICD10-I70.0). Electronically Signed   By: Jacqulynn Cadet M.D.   On: 01/13/2017 19:52     Medications:  Scheduled: . aspirin EC  81 mg Oral Daily  . [START ON 01/15/2017] calcitRIOL  0.25 mcg Oral Q M,W,F  . carvedilol  6.25 mg Oral BID  . cloNIDine  0.3 mg Oral BID  . enoxaparin (LOVENOX) injection  30 mg Subcutaneous Q24H  . furosemide  40 mg Intravenous Q12H  . gabapentin  100 mg Oral q morning - 10a  . gabapentin  200 mg Oral QHS  . insulin aspart  0-15 Units Subcutaneous TID WC  . insulin aspart  0-5 Units Subcutaneous QHS  . insulin detemir  22 Units Subcutaneous Q2200  . pantoprazole  40 mg Oral Daily  . polyethylene glycol  17 g Oral BID  . psyllium  1 packet Oral TID  . senna  1 tablet Oral BID  . simvastatin  20 mg Oral QHS  . sodium chloride flush  3 mL Intravenous Q12H  . sodium phosphate  1 enema Rectal Once   Continuous: . sodium chloride     MWU:XLKGMW chloride, acetaminophen, bisacodyl, hydrALAZINE, ondansetron (ZOFRAN) IV, sodium chloride flush  Assessment/Plan:  Principal Problem:   Acute on chronic respiratory failure with hypoxia (HCC) Active Problems:   Essential hypertension   Hyperlipidemia  Abdominal pain   CKD (chronic kidney disease) stage 3, GFR 30-59 ml/min (HCC)   Diabetes mellitus with complication (HCC)   Diastolic CHF, acute on chronic (HCC)    Acute on chronic respiratory failure with hypoxia This is most likely secondary to CHF exacerbation.  Patient is receiving diuretics.  She feels slightly better today compared to yesterday.  Continue to monitor closely.  Continue oxygen to maintain saturations 90% or above.  Acute on chronic diastolic CHF Patient with previous history of same.  She is followed by heart failure team as an outpatient and also by her regular cardiologist in [place].  She has had  hospitalization for the same previously.  Continue diuretics.  Strict ins and outs and daily weights.  She is noted to be in atrial fibrillation which appears to be a new diagnosis for her.  Previous notes did not mention this condition.  This could be the reason for her CHF exacerbation.  Rate is well controlled however.  We will consult cardiology to assist with management. Another echocardiogram has been ordered.  Atrial fibrillation Appears to be a new diagnosis for her based on previous cardiology notes.  Rate is normal.  TSH is normal.  Could be contributing to her symptoms.  She will eventually need anticoagulation.  We will defer this to cardiology.  Chest pain No ischemic changes noted on EKG.  Troponin normal x2.  CT angiogram of the chest did not show any PE.  Symptoms are most likely due to CHF.  Abdominal pain She had a CT scan on 12/17 which did not show any acute findings.  Mainly showed constipation.  She denies any nausea vomiting.  Aggressive bowel regimen.  Monitor closely.  Chronic kidney disease stage III Renal function is stable.  Monitor urine output but she is getting diuresed.  Check labs every day.  History of essential hypertension Monitor blood pressures closely while she is getting diuretics.  Continue with her usual home medication regimen.  Incidental thyroid nodule This was noted on CT scan.  TSH showed normal.  This will need outpatient evaluation.  History of hyperlipidemia Continue with her statin.  DVT Prophylaxis: Lovenox    Code Status: DNR Family Communication: Discussed with the patient Disposition Plan: Management as outlined above    LOS: 0 days   Bonnielee Haff  [facility] Pager [phone] 01/14/2017, 11:10 AM  If 7PM-7AM, please contact night-coverage at [email or web address], password [facility]

## 2017-01-14 NOTE — Evaluation (Signed)
Physical Therapy Evaluation Patient Details Name: Laura Best MRN: [id number] DOB: December 01, 1940 Today's Date: 01/14/2017   History of Present Illness  76 y.o. female admitted on 01/13/17 with acute on chronic respiratory failure with hypoxia as well as acute on chronic CHF. PMH: DM, HTN, CHF, CKD stage 3, COPD.  Clinical Impression  Pt was able to walk a good distance down the hallway with RW (as recommended by OT) and on RA.  O2sats and HR stable on RA during gait, DOE 1/4.  Pt reports she has a RW at home she can use and does use "when needed".  PT to follow acutely for deficits listed below.       Follow Up Recommendations No PT follow up    Equipment Recommendations  None recommended by PT    Recommendations for Other Services    NA    Precautions / Restrictions Precautions Precautions: Fall Precaution Comments: watch 02 sats      Mobility  Bed Mobility Overal bed mobility: Modified Independent             General bed mobility comments: Pt was able to get into and out of bed unassisted  Transfers Overall transfer level: Needs assistance Equipment used: Rolling walker (2 wheeled) Transfers: Sit to/from Stand Sit to Stand: Min guard         General transfer comment: Min guard assit for safety  Ambulation/Gait Ambulation/Gait assistance: Min guard Ambulation Distance (Feet): 130 Feet Assistive device: Rolling walker (2 wheeled) Gait Pattern/deviations: Step-through pattern;Shuffle Gait velocity: decreased   General Gait Details: Pt with slow, shuffling gait speed, safe use of RW, O2 sats 92% on RA with HR 74 in NSR.          Balance Overall balance assessment: Needs assistance Sitting-balance support: Feet supported;No upper extremity supported Sitting balance-Leahy Scale: Good     Standing balance support: Bilateral upper extremity supported;No upper extremity supported Standing balance-Leahy Scale: Fair                                Pertinent Vitals/Pain Pain Assessment: No/denies pain    Home Living Family/patient expects to be discharged to:: Private residence Living Arrangements: Children Available Help at Discharge: Family;Available 24 hours/day Type of Home: Mobile home Home Access: Stairs to enter Entrance Stairs-Rails: None Entrance Stairs-Number of Steps: 1 Home Layout: One level Home Equipment: Walker - 2 wheels;Bedside commode Additional Comments: keeps BSC over toilet, does not use RW    Prior Function Level of Independence: Independent         Comments: uses RW "when I need to"     Hand Dominance   Dominant Hand: Right    Extremity/Trunk Assessment   Upper Extremity Assessment Upper Extremity Assessment: Overall WFL for tasks assessed    Lower Extremity Assessment Lower Extremity Assessment: Generalized weakness    Cervical / Trunk Assessment Cervical / Trunk Assessment: Normal  Communication   Communication: No difficulties  Cognition Arousal/Alertness: Awake/alert Behavior During Therapy: WFL for tasks assessed/performed Overall Cognitive Status: Within Functional Limits for tasks assessed                                               Assessment/Plan    PT Assessment Patient needs continued PT services  PT Problem List Decreased strength;Decreased activity tolerance;Decreased  balance;Decreased mobility;Decreased knowledge of use of DME;Cardiopulmonary status limiting activity       PT Treatment Interventions DME instruction;Stair training;Gait training;Functional mobility training;Therapeutic activities;Balance training;Therapeutic exercise;Neuromuscular re-education;Patient/family education    PT Goals (Current goals can be found in the Care Plan section)  Acute Rehab PT Goals Patient Stated Goal: to breathe easier PT Goal Formulation: With patient Time For Goal Achievement: 01/28/17 Potential to Achieve Goals: Good    Frequency Min  3X/week    AM-PAC PT "6 Clicks" Daily Activity  Outcome Measure Difficulty turning over in bed (including adjusting bedclothes, sheets and blankets)?: None Difficulty moving from lying on back to sitting on the side of the bed? : None Difficulty sitting down on and standing up from a chair with arms (e.g., wheelchair, bedside commode, etc,.)?: None Help needed moving to and from a bed to chair (including a wheelchair)?: A Little Help needed walking in hospital room?: A Little Help needed climbing 3-5 steps with a railing? : A Little 6 Click Score: 21    End of Session Equipment Utilized During Treatment: Gait belt Activity Tolerance: Patient limited by fatigue Patient left: in bed;with call bell/phone within reach Nurse Communication: Other (comment)(pt requesting water (and likely on fluid restriction)) PT Visit Diagnosis: Unsteadiness on feet (R26.81);Muscle weakness (generalized) (M62.81);Difficulty in walking, not elsewhere classified (R26.2)    Time: 1520-1540 PT Time Calculation (min) (ACUTE ONLY): 20 min   Charges:       Wells Guiles B. Fonda Rochon, PT, DPT [phone]   PT Evaluation $PT Eval Moderate Complexity: 1 Mod     01/14/2017, 4:23 PM

## 2017-01-14 NOTE — Consult Note (Signed)
Cardiology Consultation:   Patient ID: Laura Best; [id number]; [birth year]   Admit date: 01/13/2017 Date of Consult: 01/14/2017  Primary Care Provider: Orlena Sheldon, PA-C Primary Cardiologist: Dr. Harl Bowie and Aundra Dubin Primary Electrophysiologist:  none   Patient Profile:   Laura Best is a 76 y.o. female with a hx of COPD, ASD repair, chronic diastolic heart failure, stage III chronic kidney disease and essential hypertension/hypertensive heart disease with heart failure who is being seen today for the evaluation of acute diastolic heart failure at the request of Dr. Maryland Pink.  History of Present Illness:   Laura Best is a 76 year old female with known chronic diastolic dysfunction, COPD, prior ASD repair surgical closure in her 5s.  No evidence of left to right shunt on right heart catheterization, chronic kidney disease stage III, hypertensive heart disease with heart failure seen by both Dr. Harl Bowie as well as Dr. Aundra Dubin, last on 10/16/16 admitted with shortness of breath, volume overload.  Previously was admitted in March 2018 with volume overload/diastolic heart failure and diuresed.  Developed AK I.  She was previously discharged on Lasix 40 mg a day.  In May 2018 she had a left and right heart cath which showed very mild elevated wedge pressure and mild pulmonary venous hypertension.  Nonobstructive CAD.  She had moderate LVH on her echocardiogram in March 2018.  She came in with abdominal pain.  She felt as though her breathing was problematic than her heart was hurting a little bit and then her stomach was hurting.  Her feet and legs were swollen.  Her face and feet are swelling.  Shortness of breath had been going on for quite some time.  She notices it after finishing house chores such as dishes and is having trouble sweeping the floor.  She noted 4 pillow orthopnea.  Increasing PND.  She had a cough, posttussive emesis as well.  Mouth is dry.  No fevers, chills.  She was 85% on  room air, tachypneic to the 40s on presentation.  Chest x-ray looked like diffuse edema with cardiomegaly.  No wheezing.  Elevated BNP.  She was started on Lasix 40 mg IV twice daily.  Norvasc was held secondary to edema.  Not on ACE inhibitor because of renal dysfunction.  She had noted some chest pain off and on left-sided.  Remember had  CAD/nonobstructive on catheterization.  Has improved somewhat with IV diuresis.  Past Medical History:  Diagnosis Date  . Arthritis    "hands, feet" (02/17/2016)  . CKD (chronic kidney disease) stage 3, GFR 30-59 ml/min (HCC) 12/25/2012  . COPD (chronic obstructive pulmonary disease) ([place])   . Diastolic heart failure ([place])   . Hyperlipidemia   . Hypertension   . Osteoporosis   . Pneumonia    "several times" (02/17/2016)  . Small bowel obstruction ([place]) 02/17/2016  . Tricuspid regurgitation    Echo 05/02/06-Nml LV. Mild MR. Mod TR  . Type II diabetes mellitus ([place])   . Vitamin D deficiency     Past Surgical History:  Procedure Laterality Date  . APPENDECTOMY    . HEART CHAMBER REVISION     patched hole --Elim  . LAPAROSCOPIC APPENDECTOMY N/A 09/27/2013   Procedure: APPENDECTOMY - CONVERTED FROM LAPAROSCOPIC;  Surgeon: Donnie Mesa, MD;  Location: [facility];  Service: General;  Laterality: N/A;  . RIGHT/LEFT HEART CATH AND CORONARY ANGIOGRAPHY N/A 05/24/2016   Procedure: Right/Left Heart Cath and Coronary Angiography;  Surgeon: Larey Dresser, MD;  Location: [facility]  INVASIVE CV LAB;  Service: Cardiovascular;  Laterality: N/A;  . TUBAL LIGATION       Home Medications:  Prior to Admission medications   Medication Sig Start Date End Date Taking? Authorizing Provider  acetaminophen (TYLENOL) 500 MG tablet Take 500 mg by mouth every 6 (six) hours as needed for pain.   Yes [provider]  amLODipine (NORVASC) 10 MG tablet TAKE ONE TABLET BY MOUTH ONCE DAILY Patient taking differently: TAKE ONE TABLET ('10MG'$ ) BY MOUTH ONCE DAILY 07/12/16  Yes  Lendon Colonel, NP  aspirin EC 81 MG tablet Take 81 mg by mouth daily.   Yes [provider]  bisacodyl (DULCOLAX) 5 MG EC tablet Take 5 mg by mouth daily as needed for mild constipation or moderate constipation.   Yes [provider]  calcitRIOL (ROCALTROL) 0.25 MCG capsule Take 0.25 mcg by mouth every Monday, Wednesday, and Friday.  04/19/16  Yes [provider]  carvedilol (COREG) 6.25 MG tablet TAKE 1 TABLET BY MOUTH TWICE DAILY Patient taking differently: TAKE 1 TABLET (6.25 MG) BY MOUTH TWICE DAILY 12/13/16  Yes Larey Dresser, MD  Cholecalciferol (VITAMIN D) 2000 UNITS CAPS Take 2,000 Units by mouth daily.    Yes [provider]  cloNIDine (CATAPRES) 0.3 MG tablet Take 1 tablet (0.3 mg total) by mouth 2 (two) times daily. 10/16/16  Yes Larey Dresser, MD  furosemide (LASIX) 40 MG tablet TAKE 1 TABLET BY MOUTH ONCE DAILY Patient taking differently: TAKE 1 TABLET ('40MG'$ ) BY MOUTH ONCE DAILY 11/07/16  Yes Dena Billet B, PA-C  gabapentin (NEURONTIN) 100 MG capsule TAKE 1 TO 2 CAPSULES BY MOUTH AT BEDTIME FOR  PERIPHERAL  NEUROPATHY. Patient taking differently: TAKE 1 CAPSULES (100 MG) BY MOUTH IN THE MORNING FOR  PERIPHERAL  NEUROPATHY. 01/10/17  Yes Dena Billet B, PA-C  insulin aspart (NOVOLOG) 100 UNIT/ML FlexPen Take 7 units with luch and supper.  Inject 15 minutes prior to meal or immediately after meal. 01/21/16  Yes Orlena Sheldon, PA-C  LEVEMIR FLEXTOUCH 100 UNIT/ML Pen INJECT 22 UNITS SUBCUTANEOUSLY ONCE DAILY AT 10 PM 11/07/16  Yes Dena Billet B, PA-C  omeprazole (PRILOSEC) 20 MG capsule TAKE ONE CAPSULE BY MOUTH ONCE DAILY Patient taking differently: TAKE ONE CAPSULE (20 MG) BY MOUTH ONCE DAILY 09/06/16  Yes Dena Billet B, PA-C  polyethylene glycol (MIRALAX / GLYCOLAX) packet Take 17 g by mouth daily. Patient taking differently: Take 17 g by mouth daily as needed for mild constipation.  02/20/16  Yes Lavina Hamman, MD  psyllium  (HYDROCIL/METAMUCIL) 95 % PACK Take 1 packet by mouth 3 (three) times daily. 02/19/16  Yes Lavina Hamman, MD  simvastatin (ZOCOR) 20 MG tablet TAKE ONE TABLET BY MOUTH ONCE DAILY AT BEDTIME Patient taking differently: TAKE ONE TABLET ('20MG'$ ) BY MOUTH ONCE DAILY AT BEDTIME 06/08/16  Yes Dena Billet B, PA-C  Blood Glucose Monitoring Suppl (ONE TOUCH ULTRA SYSTEM KIT) w/Device KIT 1 meter 02/11/16   Dixon, Mary B, PA-C  glucose blood test strip Use as directed. Check blood sugar 3 times daily before meals and at bedtime. 11/14/16   Dena Billet B, PA-C  Lancets [facility] ULTRASOFT) lancets Use as instructed 02/11/16   Dena Billet B, PA-C  RELION SHORT PEN NEEDLES 31G X 8 MM MISC USE ONE PEN NEEDLE ONCE DAILY AS DIRECTED 03/10/14   Orlena Sheldon, PA-C    Inpatient Medications: Scheduled Meds: . aspirin EC  81 mg Oral Daily  . [START ON  01/15/2017] calcitRIOL  0.25 mcg Oral Q M,W,F  . carvedilol  6.25 mg Oral BID  . cloNIDine  0.3 mg Oral BID  . enoxaparin (LOVENOX) injection  30 mg Subcutaneous Q24H  . furosemide  40 mg Intravenous Q12H  . gabapentin  100 mg Oral q morning - 10a  . gabapentin  200 mg Oral QHS  . insulin aspart  0-15 Units Subcutaneous TID WC  . insulin aspart  0-5 Units Subcutaneous QHS  . insulin detemir  22 Units Subcutaneous Q2200  . pantoprazole  40 mg Oral Daily  . polyethylene glycol  17 g Oral BID  . psyllium  1 packet Oral TID  . senna  1 tablet Oral BID  . simvastatin  20 mg Oral QHS  . sodium chloride flush  3 mL Intravenous Q12H   Continuous Infusions: . sodium chloride     PRN Meds: sodium chloride, acetaminophen, bisacodyl, hydrALAZINE, ondansetron (ZOFRAN) IV, sodium chloride flush  Allergies:    Allergies  Allergen Reactions  . Ace Inhibitors Other (See Comments)    Hyperkalemia  . Actos [Pioglitazone] Swelling  . Aspirin Other (See Comments)    G.I. Upset. In high doses  . Metformin And Related Diarrhea    Social History:   Social History    Socioeconomic History  . Marital status: Widowed    Spouse name: Not on file  . Number of children: Not on file  . Years of education: Not on file  . Highest education level: Not on file  Social Needs  . Financial resource strain: Not on file  . Food insecurity - worry: Not on file  . Food insecurity - inability: Not on file  . Transportation needs - medical: Not on file  . Transportation needs - non-medical: Not on file  Occupational History  . Occupation: retired  Tobacco Use  . Smoking status: Former Smoker    Packs/day: 0.50    Years: 50.00    Pack years: 25.00    Types: Cigarettes    Last attempt to quit: 01/19/2009    Years since quitting: 7.9  . Smokeless tobacco: Never Used  Substance and Sexual Activity  . Alcohol use: No    Alcohol/week: 0.0 oz  . Drug use: No  . Sexual activity: No  Other Topics Concern  . Not on file  Social History Narrative   Entered 10/2013:   She has never driven.   She lives with her son.    Family History:    Family History  Problem Relation Age of Onset  . Cancer Father   . Diabetes Father   . Cancer Brother        Brain  . Cancer Sister        abdominal fat     ROS:  Please see the history of present illness.  ROS  All other ROS reviewed and negative.     Physical Exam/Data:   Vitals:   01/14/17 0004 01/14/17 0517 01/14/17 0930 01/14/17 1137  BP: 125/65 (!) 123/51 134/82 (!) 142/52  Pulse: 88 62 78 77  Resp: '20 18 18   '$ Temp: 98.5 F (36.9 C) 97.9 F (36.6 C) 98.3 F (36.8 C)   TempSrc: Oral Oral Oral   SpO2: 98% 100% 96% 99%  Weight:  164 lb 8 oz (74.6 kg)    Height:        Intake/Output Summary (Last 24 hours) at 01/14/2017 1225 Last data filed at 01/14/2017 1146 Gross per 24 hour  Intake  942 ml  Output 1101 ml  Net -159 ml   Filed Weights   01/13/17 2014 01/14/17 0517  Weight: 170 lb 4.8 oz (77.2 kg) 164 lb 8 oz (74.6 kg)   Body mass index is 29.14 kg/m.  General:  Well nourished, well  developed, in no acute distress HEENT: normal Lymph: no adenopathy Neck: no JVD Endocrine:  No thryomegaly Vascular: No carotid bruits; FA pulses 2+ bilaterally without bruits  Cardiac:  normal S1, S2; RRR; no murmur  Lungs:  clear to auscultation bilaterally, no wheezing, rhonchi or rales  Abd: soft, nontender, no hepatomegaly  Ext: +pedal B edema Musculoskeletal:  No deformities, BUE and BLE strength normal and equal Skin: warm and dry  Neuro:  CNs 2-12 intact, no focal abnormalities noted Psych:  Normal affect   EKG:  The EKG was personally reviewed and demonstrates: 01/13/17-atrial fibrillation heart rate 113, interventricular conduction delay resembling left bundle branch block.  Telemetry:  Telemetry was personally reviewed and demonstrates: Atrial fibrillation with heart rates in the 90s-100s  Relevant CV Studies: Cardiac catheterization, echocardiogram, prior office notes reviewed see above.  Laboratory Data:  Chemistry Recent Labs  Lab 01/13/17 1309 01/14/17 0018  NA 137 136  K 3.6 3.8  CL 102 102  CO2 25 25  GLUCOSE 239* 260*  BUN 25* 21*  CREATININE 1.62* 1.52*  CALCIUM 8.8* 8.3*  GFRNONAA 30* 32*  GFRAA 34* 37*  ANIONGAP 10 9    Recent Labs  Lab 01/13/17 1309  PROT 8.2*  ALBUMIN 4.0  AST 19  ALT 14  ALKPHOS 85  BILITOT 0.6   Hematology Recent Labs  Lab 01/13/17 1309 01/14/17 0018  WBC 9.8 6.7  RBC 4.50 3.91  HGB 12.8 11.0*  HCT 39.2 34.6*  MCV 87.1 88.5  MCH 28.4 28.1  MCHC 32.7 31.8  RDW 14.5 15.0  PLT 186 152   Cardiac Enzymes Recent Labs  Lab 01/14/17 0841  TROPONINI <0.03    Recent Labs  Lab 01/13/17 1530  TROPIPOC 0.00    BNP Recent Labs  Lab 01/13/17 1526  BNP 450.0*    DDimer No results for input(s): DDIMER in the last 168 hours.  Radiology/Studies:  Dg Chest 2 View  Result Date: 01/13/2017 CLINICAL DATA:  Generalized abdominal pain, shortness of Breath EXAM: CHEST  2 VIEW COMPARISON:  04/05/2016 FINDINGS:  Cardiomegaly. Prior CABG. No confluent airspace opacities or effusions. No acute bony abnormality. IMPRESSION: Cardiomegaly.  No active disease. Electronically Signed   By: Rolm Baptise M.D.   On: 01/13/2017 16:45   Ct Angio Chest Pe W And/or Wo Contrast  Result Date: 01/13/2017 CLINICAL DATA:  76 year old female with shortness of breath and elevated D-dimer EXAM: CT ANGIOGRAPHY CHEST WITH CONTRAST TECHNIQUE: Multidetector CT imaging of the chest was performed using the standard protocol during bolus administration of intravenous contrast. Multiplanar CT image reconstructions and MIPs were obtained to evaluate the vascular anatomy. CONTRAST:  66m ISOVUE-370 IOPAMIDOL (ISOVUE-370) INJECTION 76% COMPARISON:  Chest x-ray obtained earlier today FINDINGS: Cardiovascular: Adequate opacification of the pulmonary arteries to the proximal subsegmental level. There is no evidence of acute pulmonary embolus. Cardiomegaly with right-sided heart enlargement. Atherosclerotic calcifications present throughout the coronary arteries. Normal caliber thoracic aorta. Atherosclerotic calcifications present in the aorta. No pericardial effusion. Mediastinum/Nodes: 2.3 cm nodule in the inferior aspect of the left thyroid gland. No suspicious mediastinal lymphadenopathy or mediastinal mass. Tiny hiatal hernia. Lungs/Pleura: No focal airspace consolidation. There are scattered areas of subtle ground-glass attenuation airspace opacity  in a peribronchovascular distribution in the right upper, middle and lower lobes. Trace dependent atelectasis. No suspicious pulmonary mass or nodule. Upper Abdomen: Visualized upper abdominal organs are unremarkable. Musculoskeletal: No acute fracture or aggressive appearing lytic or blastic osseous lesion. Healed median sternotomy. Review of the MIP images confirms the above findings. IMPRESSION: 1. Negative for acute pulmonary embolus. 2. Nonspecific subtle multifocal patches of ground-glass  attenuation airspace opacity throughout the right lung. Findings appear to be in a peribronchovascular distribution. This is favored to reflect an atypical or viral upper respiratory infection. Additional considerations include early pneumonitis/ alveolitis, and potentially very early asymmetric pulmonary edema. 3. Incidentally noted 2.3 cm left inferior thyroid nodule. Recommend further evaluation with non urgent thyroid ultrasound. 4. Cardiomegaly with right-sided heart enlargement. 5. Coronary artery calcifications. Aortic Atherosclerosis (ICD10-I70.0). Electronically Signed   By: Jacqulynn Cadet M.D.   On: 01/13/2017 19:52    Assessment and Plan:   Newly discovered atrial fibrillation, paroxysmal -Anticoagulation warranted given age, hypertension, heart failure.  We will go ahead and start Eliquis 5 mg twice a day -If she continues to improve, feel better with IV diuresis we will continue Eliquis for 3 weeks and if atrial fibrillation is still present, then cardiovert at that time.  If she does not feel better ultimately, we could consider TEE cardioversion sooner rather than later.  Chest pain -Could be from heart failure exacerbation, myocardial stretch.  Cardiac catheterization previously was reassuring showing no obstruction/no obstructive coronary disease  Abdominal pain -Likely constipation  Acute on chronic diastolic heart failure with mild pulmonary hypertension - Right and left heart catheterization in May 2018 reviewed.  Minimal right-sided elevation in pressures.  Nonobstructive CAD.  Normal ejection fraction.  Echocardiogram has been ordered. -Agree with IV Lasix 40 twice daily.  Goal diuresis is between 1-2 L/day.  Watch for signs of worsening acute kidney injury. -Agree that atrial fibrillation may be precipitating heart failure exacerbation as well.  Hypertensive heart disease with heart failure -Agree with holding the amlodipine given her pedal edema. -Continue to work  with other pharmacologic solutions.  Hopefully taking all fluid will also help.  For questions or updates, please contact [place] Please consult [email or web address] for contact info under Cardiology/STEMI.   Signed, Candee Furbish, MD  01/14/2017 12:25 PM

## 2017-01-14 NOTE — Progress Notes (Signed)
  Echocardiogram 2D Echocardiogram has been performed.  Laura Best 01/14/2017, 5:59 PM

## 2017-01-14 NOTE — Plan of Care (Signed)
  Education: Ability to demonstrate management of disease process will improve 01/14/2017 0422 - Completed/Met by Garnett-Mellinger, Sophronia Simas, RN

## 2017-01-14 NOTE — Plan of Care (Signed)
  Education: Knowledge of General Education information will improve 01/14/2017 0421 - Completed/Met by Evert Kohl, RN

## 2017-01-14 NOTE — Plan of Care (Signed)
  Pain Managment: General experience of comfort will improve 01/14/2017 0424 - Completed/Met by Garnett-Mellinger, Sophronia Simas, RN

## 2017-01-14 NOTE — Evaluation (Signed)
Occupational Therapy Evaluation Patient Details Name: Laura Best MRN: [id number] DOB: 28-Jan-1940 Today's Date: 01/14/2017    History of Present Illness Pt admitted with acute on chronic respiratory failure with hypoxia. PMH: DM, HTN, CHF, CKD stage 3, COPD.   Clinical Impression   Pt is typically independent in ambulation in her home and performs self care and light housekeeping and light meal prep. She reports she gets tired if walking in a store, so does not go out much. Pt presents with poor activity tolerance and impaired standing balance requiring hand held assist to ambulate within her room. Pt with abdominal distension, requiring extra effort for LB ADL, suspect this will not be an issue as her fluid reduces. Will follow acutely.    Follow Up Recommendations  No OT follow up    Equipment Recommendations  None recommended by OT    Recommendations for Other Services       Precautions / Restrictions Precautions Precautions: Fall Precaution Comments: watch 02 sats      Mobility Bed Mobility               General bed mobility comments: pt received in chair  Transfers Overall transfer level: Needs assistance Equipment used: 1 person hand held assist Transfers: Sit to/from Stand Sit to Stand: Min guard         General transfer comment: pt with wobbly legs, reaching for furniture and OTs hand, likely safer with RW    Balance Overall balance assessment: Needs assistance   Sitting balance-Leahy Scale: Good       Standing balance-Leahy Scale: Fair                             ADL either performed or assessed with clinical judgement   ADL Overall ADL's : Needs assistance/impaired Eating/Feeding: Independent;Sitting   Grooming: Wash/dry hands;Standing;Min guard   Upper Body Bathing: Set up;Sitting   Lower Body Bathing: Min guard;Sit to/from stand   Upper Body Dressing : Set up;Sitting   Lower Body Dressing: Min guard;Sit to/from stand   Toilet Transfer: Minimal assistance;Ambulation;BSC   Toileting- Water quality scientist and Hygiene: Min guard;Sit to/from Nurse, children's Details (indicate cue type and reason): educated pt in use of 3 in 1 also as shower seat Functional mobility during ADLs: Minimal assistance General ADL Comments: pt with difficulty due to abdominal distention, but able     Vision Baseline Vision/History: Wears glasses Wears Glasses: Reading only       Perception     Praxis      Pertinent Vitals/Pain Pain Assessment: No/denies pain     Hand Dominance Right   Extremity/Trunk Assessment Upper Extremity Assessment Upper Extremity Assessment: Overall WFL for tasks assessed   Lower Extremity Assessment Lower Extremity Assessment: Defer to PT evaluation       Communication Communication Communication: No difficulties   Cognition Arousal/Alertness: Awake/alert Behavior During Therapy: WFL for tasks assessed/performed Overall Cognitive Status: Within Functional Limits for tasks assessed                                     General Comments       Exercises     Shoulder Instructions      Home Living Family/patient expects to be discharged to:: Private residence Living Arrangements: Children Available Help at Discharge: Family;Available 24 hours/day Type of Home: Mobile  home Home Access: Stairs to enter [employer] of Steps: 1 Entrance Stairs-Rails: None Home Layout: One level     Bathroom Shower/Tub: Occupational psychologist: Standard     Home Equipment: Environmental consultant - 2 wheels;Bedside commode   Additional Comments: keeps BSC over toilet, does not use RW      Prior Functioning/Environment Level of Independence: Independent                 OT Problem List: Decreased activity tolerance;Impaired balance (sitting and/or standing);Cardiopulmonary status limiting activity;Decreased knowledge of use of DME or AE      OT  Treatment/Interventions: Self-care/ADL training;DME and/or AE instruction;Therapeutic activities;Patient/family education;Balance training;Energy conservation    OT Goals(Current goals can be found in the care plan section) Acute Rehab OT Goals Patient Stated Goal: to breathe easier OT Goal Formulation: With patient Time For Goal Achievement: 02/04/17 Potential to Achieve Goals: Good ADL Goals Pt Will Perform Grooming: with modified independence;standing Pt Will Perform Lower Body Bathing: with modified independence;sit to/from stand Pt Will Perform Lower Body Dressing: with modified independence;sit to/from stand Pt Will Transfer to Toilet: with modified independence;bedside commode;ambulating Pt Will Perform Toileting - Clothing Manipulation and hygiene: with modified independence;sit to/from stand Pt Will Perform Tub/Shower Transfer: Shower transfer;with supervision;3 in 1;rolling walker Additional ADL Goal #1: Pt will generalize energy conservation strategies in ADL independently.  OT Frequency: Min 2X/week   Barriers to D/C:            Co-evaluation              AM-PAC PT "6 Clicks" Daily Activity     Outcome Measure Help from another person eating meals?: None Help from another person taking care of personal grooming?: A Little Help from another person toileting, which includes using toliet, bedpan, or urinal?: A Little Help from another person bathing (including washing, rinsing, drying)?: A Little Help from another person to put on and taking off regular upper body clothing?: None Help from another person to put on and taking off regular lower body clothing?: A Little 6 Click Score: 20   End of Session Equipment Utilized During Treatment: Gait belt;Oxygen  Activity Tolerance: Patient limited by fatigue Patient left: in chair;with call bell/phone within reach;with chair alarm set  OT Visit Diagnosis: Unsteadiness on feet (R26.81)                Time: 4827-0786 OT  Time Calculation (min): 20 min Charges:  OT General Charges $OT Visit: 1 Visit OT Evaluation $OT Eval Moderate Complexity: 1 Mod G-Codes: OT G-codes **NOT FOR INPATIENT CLASS** Functional Assessment Tool Used: Clinical judgement Functional Limitation: Self care Self Care Current Status (L5449): At least 20 percent but less than 40 percent impaired, limited or restricted Self Care Goal Status (E0100): At least 1 percent but less than 20 percent impaired, limited or restricted   01/14/2017 Nestor Lewandowsky, OTR/L Pager: [phone]  Malka So 01/14/2017, 10:34 AM

## 2017-01-15 DIAGNOSIS — I10 Essential (primary) hypertension: Secondary | ICD-10-CM

## 2017-01-15 DIAGNOSIS — I481 Persistent atrial fibrillation: Secondary | ICD-10-CM

## 2017-01-15 DIAGNOSIS — I4819 Other persistent atrial fibrillation: Secondary | ICD-10-CM

## 2017-01-15 LAB — CBC
HEMATOCRIT: 32.4 % — AB (ref 36.0–46.0)
HEMOGLOBIN: 10.3 g/dL — AB (ref 12.0–15.0)
MCH: 28.3 pg (ref 26.0–34.0)
MCHC: 31.8 g/dL (ref 30.0–36.0)
MCV: 89 fL (ref 78.0–100.0)
Platelets: 146 10*3/uL — ABNORMAL LOW (ref 150–400)
RBC: 3.64 MIL/uL — AB (ref 3.87–5.11)
RDW: 14.7 % (ref 11.5–15.5)
WBC: 4.8 10*3/uL (ref 4.0–10.5)

## 2017-01-15 LAB — GLUCOSE, CAPILLARY
GLUCOSE-CAPILLARY: 196 mg/dL — AB (ref 65–99)
GLUCOSE-CAPILLARY: 159 mg/dL — AB (ref 65–99)
Glucose-Capillary: 170 mg/dL — ABNORMAL HIGH (ref 65–99)
Glucose-Capillary: 140 mg/dL — ABNORMAL HIGH (ref 65–99)

## 2017-01-15 LAB — ECHOCARDIOGRAM COMPLETE
FS: 30 % (ref 28–44)
Height: 63 in
IV/PV OW: 0.81
LA diam end sys: 49 mm
LA vol index: 39.6 mL/m2
LADIAMINDEX: 2.75 cm/m2
LASIZE: 49 mm
LAVOL: 70.4 mL
LAVOLA4C: 80.4 mL
LDCA: 2.84 cm2
LVOTD: 19 mm
PW: 13.4 mm — AB (ref 0.6–1.1)
RV TAPSE: 11.7 mm
Weight: 2632 oz

## 2017-01-15 LAB — BASIC METABOLIC PANEL
ANION GAP: 5 (ref 5–15)
BUN: 24 mg/dL — ABNORMAL HIGH (ref 6–20)
CALCIUM: 8.7 mg/dL — AB (ref 8.9–10.3)
CO2: 30 mmol/L (ref 22–32)
Chloride: 101 mmol/L (ref 101–111)
Creatinine, Ser: 1.88 mg/dL — ABNORMAL HIGH (ref 0.44–1.00)
GFR, EST AFRICAN AMERICAN: 29 mL/min — AB (ref >60)
GFR, EST NON AFRICAN AMERICAN: 25 mL/min — AB (ref >60)
Glucose, Bld: 212 mg/dL — ABNORMAL HIGH (ref 65–99)
POTASSIUM: 4 mmol/L (ref 3.5–5.1)
SODIUM: 136 mmol/L (ref 135–145)

## 2017-01-15 MED ORDER — APIXABAN 5 MG PO TABS
5.0000 mg | ORAL_TABLET | Freq: Two times a day (BID) | ORAL | Status: DC
  Administered 2017-01-15 – 2017-01-16 (×3): 5 mg via ORAL
  Filled 2017-01-15 (×3): qty 1

## 2017-01-15 NOTE — Progress Notes (Signed)
Physical Therapy Treatment Patient Details Name: Laura Best MRN: [id number] DOB: 12-19-1940 Today's Date: 01/15/2017    History of Present Illness 76 y.o. female admitted on 01/13/17 with acute on chronic respiratory failure with hypoxia as well as acute on chronic CHF. PMH: DM, HTN, CHF, CKD stage 3, COPD.    PT Comments    Pt progressing well towards physical therapy goals.  Was able to ambulate in hall ~200 feet with 1 standing rest break due to neuropathy in feet (states they felt tired). Progressed to no AD this session with no noted LOB. Will continue to follow and progress as able per POC.   Follow Up Recommendations  No PT follow up     Equipment Recommendations  None recommended by PT    Recommendations for Other Services       Precautions / Restrictions Precautions Precautions: Fall Precaution Comments: watch 02 sats Restrictions Weight Bearing Restrictions: No    Mobility  Bed Mobility               General bed mobility comments: Pt received sitting up in recliner   Transfers Overall transfer level: Needs assistance Equipment used: Rolling walker (2 wheeled) Transfers: Sit to/from Stand Sit to Stand: Min guard         General transfer comment: Min guard assit for safety. Increased time.   Ambulation/Gait Ambulation/Gait assistance: Min guard  Ambulation/Gait distance: 200 feet Assistive device: 1 person hand held assist Gait Pattern/deviations: Step-through pattern;Shuffle Gait velocity: decreased Gait velocity interpretation: Below normal speed for age/gender General Gait Details: Slow but generally steady gait with HHA initially progressing to no UE support. Reports neuropathy pain in feet limiting her.    Stairs            Wheelchair Mobility    Modified Rankin (Stroke Patients Only)       Balance Overall balance assessment: Needs assistance Sitting-balance support: Feet supported;No upper extremity supported Sitting  balance-Leahy Scale: Good     Standing balance support: Bilateral upper extremity supported;No upper extremity supported Standing balance-Leahy Scale: Fair                              Cognition Arousal/Alertness: Awake/alert Behavior During Therapy: WFL for tasks assessed/performed Overall Cognitive Status: Within Functional Limits for tasks assessed                                        Exercises General Exercises - Lower Extremity Long Arc Quad: 15 reps;Both    General Comments        Pertinent Vitals/Pain Pain Assessment: No/denies pain    Home Living                      Prior Function            PT Goals (current goals can now be found in the care plan section) Acute Rehab PT Goals Patient Stated Goal: Home when able PT Goal Formulation: With patient Time For Goal Achievement: 01/28/17 Potential to Achieve Goals: Good Progress towards PT goals: Progressing toward goals    Frequency    Min 3X/week      PT Plan Current plan remains appropriate    Co-evaluation              AM-PAC PT "6 Clicks" Daily Activity  Outcome Measure  Difficulty turning over in bed (including adjusting bedclothes, sheets and blankets)?: None Difficulty moving from lying on back to sitting on the side of the bed? : None Difficulty sitting down on and standing up from a chair with arms (e.g., wheelchair, bedside commode, etc,.)?: None Help needed moving to and from a bed to chair (including a wheelchair)?: A Little Help needed walking in hospital room?: A Little Help needed climbing 3-5 steps with a railing? : A Little 6 Click Score: 21    End of Session Equipment Utilized During Treatment: Gait belt Activity Tolerance: Patient limited by fatigue Patient left: in chair;with call bell/phone within reach;with chair alarm set Nurse Communication: Mobility status PT Visit Diagnosis: Unsteadiness on feet (R26.81);Muscle weakness  (generalized) (M62.81);Difficulty in walking, not elsewhere classified (R26.2)     Time: 5374-8270 PT Time Calculation (min) (ACUTE ONLY): 27 min  Charges:  $Gait Training: 23-37 mins                    G Codes:       Rolinda Roan, PT, DPT Acute Rehabilitation Services Pager: [phone]    Thelma Comp 01/15/2017, 10:23 AM

## 2017-01-15 NOTE — Progress Notes (Signed)
The patient has been seen in conjunction with Cecilie Kicks, NP. All aspects of care have been considered and discussed. The patient has been personally interviewed, examined, and all clinical data has been reviewed.   Plan to continue diuresis.  BNP 450.  On Lasix 40 mg IV twice daily.  Home dose 40 mg daily.  Minimal urine output overall negative approximately 900 cc.  Plan continue IV diuresis.  Watch kidney function.  If unable to improve dyspnea, may require TEE guided cardioversion before discharge.  Progress Note  Patient Name: Laura Best Date of Encounter: 01/15/2017  Primary Cardiologist: Carlyle Dolly, MD  Dr. Aundra Dubin  Subjective   No chest pain, some SOB waking back from [facility]  Inpatient Medications    Scheduled Meds: . aspirin EC  81 mg Oral Daily  . calcitRIOL  0.25 mcg Oral Q M,W,F  . carvedilol  6.25 mg Oral BID  . cloNIDine  0.3 mg Oral BID  . enoxaparin (LOVENOX) injection  30 mg Subcutaneous Q24H  . furosemide  40 mg Intravenous Q12H  . gabapentin  100 mg Oral q morning - 10a  . gabapentin  200 mg Oral QHS  . insulin aspart  0-15 Units Subcutaneous TID WC  . insulin aspart  0-5 Units Subcutaneous QHS  . insulin detemir  22 Units Subcutaneous Q2200  . pantoprazole  40 mg Oral Daily  . polyethylene glycol  17 g Oral BID  . psyllium  1 packet Oral TID  . senna  1 tablet Oral BID  . simvastatin  20 mg Oral QHS  . sodium chloride flush  3 mL Intravenous Q12H   Continuous Infusions: . sodium chloride     PRN Meds: sodium chloride, acetaminophen, bisacodyl, hydrALAZINE, ondansetron (ZOFRAN) IV, sodium chloride flush   Vital Signs    Vitals:   01/15/17 0026 01/15/17 0443 01/15/17 0811 01/15/17 0900  BP: (!) 126/50 (!) 121/56 (!) 142/55 (!) 119/56  Pulse: 60 63 78 72  Resp: 18 18  18   Temp: 98.5 F (36.9 C) 98 F (36.7 C)  98 F (36.7 C)  TempSrc: Oral Oral  Oral  SpO2: 98% 97% 97% 98%  Weight:  172 lb 1.6 oz (78.1 kg)    Height:         Intake/Output Summary (Last 24 hours) at 01/15/2017 1049 Last data filed at 01/15/2017 0827 Gross per 24 hour  Intake 840 ml  Output 1601 ml  Net -761 ml   Filed Weights   01/13/17 2014 01/14/17 0517 01/15/17 0443  Weight: 170 lb 4.8 oz (77.2 kg) 164 lb 8 oz (74.6 kg) 172 lb 1.6 oz (78.1 kg)    Telemetry    Afib with occ pause  - Personally Reviewed  ECG    No new - Personally Reviewed  Physical Exam   GEN: No acute distress.   Neck: No JVD sitting up in chair Cardiac: irreg irreg, no murmurs, rubs, or gallops.  Respiratory: bilateral breath sounds to auscultation bilaterally. occ wheeze no rales GI: Soft, nontender, non-distended  MS: No edema; No deformity. Neuro:  Nonfocal  Psych: Normal affect   Labs    Chemistry Recent Labs  Lab 01/13/17 1309 01/14/17 0018 01/15/17 0618  NA 137 136 136  K 3.6 3.8 4.0  CL 102 102 101  CO2 25 25 30   GLUCOSE 239* 260* 212*  BUN 25* 21* 24*  CREATININE 1.62* 1.52* 1.88*  CALCIUM 8.8* 8.3* 8.7*  PROT 8.2*  --   --  ALBUMIN 4.0  --   --   AST 19  --   --   ALT 14  --   --   ALKPHOS 85  --   --   BILITOT 0.6  --   --   GFRNONAA 30* 32* 25*  GFRAA 34* 37* 29*  ANIONGAP 10 9 5      Hematology Recent Labs  Lab 01/13/17 1309 01/14/17 0018 01/15/17 0618  WBC 9.8 6.7 4.8  RBC 4.50 3.91 3.64*  HGB 12.8 11.0* 10.3*  HCT 39.2 34.6* 32.4*  MCV 87.1 88.5 89.0  MCH 28.4 28.1 28.3  MCHC 32.7 31.8 31.8  RDW 14.5 15.0 14.7  PLT 186 152 146*    Cardiac Enzymes Recent Labs  Lab 01/14/17 0841 01/14/17 1423  TROPONINI <0.03 <0.03    Recent Labs  Lab 01/13/17 1530  TROPIPOC 0.00     BNP Recent Labs  Lab 01/13/17 1526  BNP 450.0*     DDimer No results for input(s): DDIMER in the last 168 hours.   Radiology    Dg Chest 2 View  Result Date: 01/13/2017 CLINICAL DATA:  Generalized abdominal pain, shortness of Breath EXAM: CHEST  2 VIEW COMPARISON:  04/05/2016 FINDINGS: Cardiomegaly. Prior CABG. No  confluent airspace opacities or effusions. No acute bony abnormality. IMPRESSION: Cardiomegaly.  No active disease. Electronically Signed   By: Rolm Baptise M.D.   On: 01/13/2017 16:45   Ct Angio Chest Pe W And/or Wo Contrast  Result Date: 01/13/2017 CLINICAL DATA:  76 year old female with shortness of breath and elevated D-dimer EXAM: CT ANGIOGRAPHY CHEST WITH CONTRAST TECHNIQUE: Multidetector CT imaging of the chest was performed using the standard protocol during bolus administration of intravenous contrast. Multiplanar CT image reconstructions and MIPs were obtained to evaluate the vascular anatomy. CONTRAST:  36mL ISOVUE-370 IOPAMIDOL (ISOVUE-370) INJECTION 76% COMPARISON:  Chest x-ray obtained earlier today FINDINGS: Cardiovascular: Adequate opacification of the pulmonary arteries to the proximal subsegmental level. There is no evidence of acute pulmonary embolus. Cardiomegaly with right-sided heart enlargement. Atherosclerotic calcifications present throughout the coronary arteries. Normal caliber thoracic aorta. Atherosclerotic calcifications present in the aorta. No pericardial effusion. Mediastinum/Nodes: 2.3 cm nodule in the inferior aspect of the left thyroid gland. No suspicious mediastinal lymphadenopathy or mediastinal mass. Tiny hiatal hernia. Lungs/Pleura: No focal airspace consolidation. There are scattered areas of subtle ground-glass attenuation airspace opacity in a peribronchovascular distribution in the right upper, middle and lower lobes. Trace dependent atelectasis. No suspicious pulmonary mass or nodule. Upper Abdomen: Visualized upper abdominal organs are unremarkable. Musculoskeletal: No acute fracture or aggressive appearing lytic or blastic osseous lesion. Healed median sternotomy. Review of the MIP images confirms the above findings. IMPRESSION: 1. Negative for acute pulmonary embolus. 2. Nonspecific subtle multifocal patches of ground-glass attenuation airspace opacity throughout  the right lung. Findings appear to be in a peribronchovascular distribution. This is favored to reflect an atypical or viral upper respiratory infection. Additional considerations include early pneumonitis/ alveolitis, and potentially very early asymmetric pulmonary edema. 3. Incidentally noted 2.3 cm left inferior thyroid nodule. Recommend further evaluation with non urgent thyroid ultrasound. 4. Cardiomegaly with right-sided heart enlargement. 5. Coronary artery calcifications. Aortic Atherosclerosis (ICD10-I70.0). Electronically Signed   By: Jacqulynn Cadet M.D.   On: 01/13/2017 19:52    Cardiac Studies   01/14/17 Echo Study Conclusions  - Left ventricle: The cavity size was normal. Wall thickness was   increased in a pattern of mild LVH. Systolic function was normal.   The estimated ejection fraction  was in the range of 55% to 60%.   Wall motion was normal; there were no regional wall motion   abnormalities. - Mitral valve: Calcified annulus. There was mild regurgitation. - Left atrium: The atrium was moderately dilated. - Right ventricle: The cavity size was mildly dilated. Systolic   function was mildly reduced. - Right atrium: The atrium was mildly dilated. - Pulmonary arteries: Systolic pressure was mildly increased.  Impressions:  - Normal LV systolic function; mild LVH; mild MR; moderate LAE;   mild RAE and RVE; mild TR; mild pulmonary hypertension.   Patient Profile     76 y.o. female with a hx of COPD, ASD repair, chronic diastolic heart failure, stage III chronic kidney disease and essential hypertension/hypertensive heart disease with heart failure who is being seen today for the evaluation of acute diastolic heart failure at the request of Dr. Maryland Pink.  Assessment & Plan    New a fib, paroxysmal-   Eliquis started, plan to stay on for 3 weeks and plan DCCV if still in a fib.  Would plan DCCV if no missed doses. IF still does not feel well after diuresing then TEE  DCCV sooner.    Chest pain  -- Could be from heart failure exacerbation, myocardial stretch.  Cardiac catheterization previously 05/2016 was reassuring showing no obstruction/no obstructive coronary disease   Acute on chronic diastolic HF with mild PH   --Minimal right-sided elevation in pressures.  Nonobstructive CAD.  Normal ejection fraction.  Echocardiogram has been done --EF 55-60%  Normal LV systolic function; mild LVH; mild MR; moderate LAE;   mild RAE and RVE; mild TR; mild pulmonary hypertension. --neg 859 since admit and weight does no seem to be accurate  Hypertensive heart disease with heart failure  -holding amlodipine with pedal edema   CKD 4 and cr up to 1.88 up slightly on lasix 40 IV every 12 hours. ? To change to po today  For questions or updates, please contact [facility] Please consult [email or web address] for contact info under Cardiology/STEMI.      Signed, Cecilie Kicks, NP  01/15/2017, 10:49 AM

## 2017-01-15 NOTE — Progress Notes (Signed)
ANTICOAGULATION CONSULT NOTE - Initial Consult  Pharmacy Consult for apixaban Indication: atrial fibrillation  Allergies  Allergen Reactions  . Ace Inhibitors Other (See Comments)    Hyperkalemia  . Actos [Pioglitazone] Swelling  . Aspirin Other (See Comments)    G.I. Upset. In high doses  . Metformin And Related Diarrhea    Patient Measurements: Height: '5\' 3"'$  (160 cm) Weight: 172 lb 1.6 oz (78.1 kg) IBW/kg (Calculated) : 52.4   Vital Signs: Temp: 98 F (36.7 C) (12/24 0900) Temp Source: Oral (12/24 0900) BP: 119/56 (12/24 0900) Pulse Rate: 72 (12/24 0900)  Labs: Recent Labs    01/13/17 1309 01/14/17 0018 01/14/17 0841 01/14/17 1423 01/15/17 0618  HGB 12.8 11.0*  --   --  10.3*  HCT 39.2 34.6*  --   --  32.4*  PLT 186 152  --   --  146*  CREATININE 1.62* 1.52*  --   --  1.88*  TROPONINI  --   --  <0.03 <0.03  --     Estimated Creatinine Clearance: 25.2 mL/min (A) (by C-G formula based on SCr of 1.88 mg/dL (H)).   Medical History: Past Medical History:  Diagnosis Date  . Arthritis    "hands, feet" (02/17/2016)  . CKD (chronic kidney disease) stage 3, GFR 30-59 ml/min (HCC) 12/25/2012  . COPD (chronic obstructive pulmonary disease) ([place])   . Diastolic heart failure ([place])   . Hyperlipidemia   . Hypertension   . Osteoporosis   . Pneumonia    "several times" (02/17/2016)  . Small bowel obstruction ([place]) 02/17/2016  . Tricuspid regurgitation    Echo 05/02/06-Nml LV. Mild MR. Mod TR  . Type II diabetes mellitus ([place])   . Vitamin D deficiency     Medications:  Medications Prior to Admission  Medication Sig Dispense Refill Last Dose  . acetaminophen (TYLENOL) 500 MG tablet Take 500 mg by mouth every 6 (six) hours as needed for pain.   01/12/2017 at Unknown time  . amLODipine (NORVASC) 10 MG tablet TAKE ONE TABLET BY MOUTH ONCE DAILY (Patient taking differently: TAKE ONE TABLET ('10MG'$ ) BY MOUTH ONCE DAILY) 90 tablet 3 01/13/2017 at Unknown time  . aspirin EC 81  MG tablet Take 81 mg by mouth daily.   01/13/2017 at Unknown time  . bisacodyl (DULCOLAX) 5 MG EC tablet Take 5 mg by mouth daily as needed for mild constipation or moderate constipation.   01/13/2017 at Unknown time  . calcitRIOL (ROCALTROL) 0.25 MCG capsule Take 0.25 mcg by mouth every Monday, Wednesday, and Friday.    01/12/2017 at Unknown time  . carvedilol (COREG) 6.25 MG tablet TAKE 1 TABLET BY MOUTH TWICE DAILY (Patient taking differently: TAKE 1 TABLET (6.25 MG) BY MOUTH TWICE DAILY) 60 tablet 3 01/13/2017 at 07:30  . Cholecalciferol (VITAMIN D) 2000 UNITS CAPS Take 2,000 Units by mouth daily.    01/13/2017 at Unknown time  . cloNIDine (CATAPRES) 0.3 MG tablet Take 1 tablet (0.3 mg total) by mouth 2 (two) times daily. 60 tablet 3 01/13/2017 at Unknown time  . furosemide (LASIX) 40 MG tablet TAKE 1 TABLET BY MOUTH ONCE DAILY (Patient taking differently: TAKE 1 TABLET ('40MG'$ ) BY MOUTH ONCE DAILY) 90 tablet 0 01/13/2017 at Unknown time  . gabapentin (NEURONTIN) 100 MG capsule TAKE 1 TO 2 CAPSULES BY MOUTH AT BEDTIME FOR  PERIPHERAL  NEUROPATHY. (Patient taking differently: TAKE 1 CAPSULES (100 MG) BY MOUTH IN THE MORNING FOR  PERIPHERAL  NEUROPATHY.) 120 capsule 0 01/13/2017 at Unknown  time  . insulin aspart (NOVOLOG) 100 UNIT/ML FlexPen Take 7 units with luch and supper.  Inject 15 minutes prior to meal or immediately after meal. 15 mL 11 01/12/2017 at Unknown time  . LEVEMIR FLEXTOUCH 100 UNIT/ML Pen INJECT 22 UNITS SUBCUTANEOUSLY ONCE DAILY AT 10 PM 15 mL 0 01/12/2017 at Unknown time  . omeprazole (PRILOSEC) 20 MG capsule TAKE ONE CAPSULE BY MOUTH ONCE DAILY (Patient taking differently: TAKE ONE CAPSULE (20 MG) BY MOUTH ONCE DAILY) 90 capsule 3 01/13/2017 at Unknown time  . polyethylene glycol (MIRALAX / GLYCOLAX) packet Take 17 g by mouth daily. (Patient taking differently: Take 17 g by mouth daily as needed for mild constipation. ) 14 each 0 01/12/2017 at Unknown time  . psyllium  (HYDROCIL/METAMUCIL) 95 % PACK Take 1 packet by mouth 3 (three) times daily. 56 each 0 01/12/2017 at Unknown time  . simvastatin (ZOCOR) 20 MG tablet TAKE ONE TABLET BY MOUTH ONCE DAILY AT BEDTIME (Patient taking differently: TAKE ONE TABLET ('20MG'$ ) BY MOUTH ONCE DAILY AT BEDTIME) 90 tablet 3 01/12/2017 at Unknown time  . Blood Glucose Monitoring Suppl (ONE TOUCH ULTRA SYSTEM KIT) w/Device KIT 1 meter 1 each 0 Taking  . glucose blood test strip Use as directed. Check blood sugar 3 times daily before meals and at bedtime. 100 each 12 Taking  . Lancets (ONETOUCH ULTRASOFT) lancets Use as instructed 100 each 12 Taking  . RELION SHORT PEN NEEDLES 31G X 8 MM MISC USE ONE PEN NEEDLE ONCE DAILY AS DIRECTED 50 each 11 Taking   Scheduled:  . calcitRIOL  0.25 mcg Oral Q M,W,F  . carvedilol  6.25 mg Oral BID  . cloNIDine  0.3 mg Oral BID  . enoxaparin (LOVENOX) injection  30 mg Subcutaneous Q24H  . furosemide  40 mg Intravenous Q12H  . gabapentin  100 mg Oral q morning - 10a  . gabapentin  200 mg Oral QHS  . insulin aspart  0-15 Units Subcutaneous TID WC  . insulin aspart  0-5 Units Subcutaneous QHS  . insulin detemir  22 Units Subcutaneous Q2200  . pantoprazole  40 mg Oral Daily  . polyethylene glycol  17 g Oral BID  . psyllium  1 packet Oral TID  . senna  1 tablet Oral BID  . simvastatin  20 mg Oral QHS  . sodium chloride flush  3 mL Intravenous Q12H    Assessment: 76 yo female with afib. Pharmacy consulted to dose apixaban -SCr= 1.88, wt= 78kg -hg= 10.4  Goal of Therapy:  Monitor platelets by anticoagulation protocol: Yes   Plan:   -Apixaban '5mg'$  po bid -Will follow patient progress  Hildred Laser, Pharm D 01/15/2017 12:31 PM

## 2017-01-15 NOTE — Discharge Instructions (Signed)

## 2017-01-15 NOTE — Progress Notes (Signed)
[facility] PROGRESS NOTE  Laura Best XBM:[id number] DOB: January 25, 1940 DOA: 01/13/2017  PCP: Orlena Sheldon, PA-C  Brief History/Interval Summary: 76 year old Caucasian female with a past medical history of diabetes mellitus type 2, hypertension, diastolic heart failure, stage III chronic kidney disease, COPD presented with complaints of abdominal distention, shortness of breath, swelling of her legs.  She also mentioned orthopnea and PND.  She was thought to have acute on chronic diastolic CHF.  She was hospitalized for further management.  Reason for Visit: Acute diastolic CHF.  New-onset atrial fibrillation.  Consultants: Cardiology  Procedures:  Transthoracic echocardiogram - Left ventricle: The cavity size was normal. Wall thickness was   increased in a pattern of mild LVH. Systolic function was normal.   The estimated ejection fraction was in the range of 55% to 60%.   Wall motion was normal; there were no regional wall motion   abnormalities. - Mitral valve: Calcified annulus. There was mild regurgitation. - Left atrium: The atrium was moderately dilated. - Right ventricle: The cavity size was mildly dilated. Systolic   function was mildly reduced. - Right atrium: The atrium was mildly dilated. - Pulmonary arteries: Systolic pressure was mildly increased.  Impressions:  - Normal LV systolic function; mild LVH; mild MR; moderate LAE;   mild RAE and RVE; mild TR; mild pulmonary hypertension.  Antibiotics: None  Subjective/Interval History: Patient states that she is feeling well this morning.  Denies any chest pain this morning.  Shortness of breath is improved.  Denies any nausea or vomiting.    ROS: Denies any headaches.  No palpitations.  Objective:  Vital Signs  Vitals:   01/14/17 1300 01/14/17 2023 01/15/17 0026 01/15/17 0443  BP: (!) 102/52 123/61 (!) 126/50 (!) 121/56  Pulse: (!) 114 65 60 63  Resp: 18 18 18 18   Temp: 97.8 F (36.6 C) 98.5 F  (36.9 C) 98.5 F (36.9 C) 98 F (36.7 C)  TempSrc: Oral Oral Oral Oral  SpO2: 96% 98% 98% 97%  Weight:    78.1 kg (172 lb 1.6 oz)  Height:        Intake/Output Summary (Last 24 hours) at 01/15/2017 0810 Last data filed at 01/15/2017 0450 Gross per 24 hour  Intake 840 ml  Output 1301 ml  Net -461 ml   Filed Weights   01/13/17 2014 01/14/17 0517 01/15/17 0443  Weight: 77.2 kg (170 lb 4.8 oz) 74.6 kg (164 lb 8 oz) 78.1 kg (172 lb 1.6 oz)    General appearance: Awake alert.  In no distress. Resp: Air entry has improved bilaterally.  Few crackles at the bases.  Normal effort at rest.  No wheezing.   Cardio: S1-S2 is irregularly red improving pedal edema.  No obvious murmurs appreciated.   GI: Abdomen remains soft.  Nontender nondistended.  No masses or organomegaly. Extremities: Much improved pedal edema bilaterally. Neurologic: Awake and alert.  Mildly distracted.  No obvious focal neurological deficits.  Lab Results:  Data Reviewed: I have personally reviewed following labs and imaging studies  CBC: Recent Labs  Lab 01/13/17 1309 01/14/17 0018 01/15/17 0618  WBC 9.8 6.7 4.8  NEUTROABS  --  4.9  --   HGB 12.8 11.0* 10.3*  HCT 39.2 34.6* 32.4*  MCV 87.1 88.5 89.0  PLT 186 152 146*    Basic Metabolic Panel: Recent Labs  Lab 01/13/17 1309 01/14/17 0018 01/15/17 0618  NA 137 136 136  K 3.6 3.8 4.0  CL 102 102 101  CO2  25 25 30   GLUCOSE 239* 260* 212*  BUN 25* 21* 24*  CREATININE 1.62* 1.52* 1.88*  CALCIUM 8.8* 8.3* 8.7*    GFR: Estimated Creatinine Clearance: 25.2 mL/min (A) (by C-G formula based on SCr of 1.88 mg/dL (H)).  Liver Function Tests: Recent Labs  Lab 01/13/17 1309  AST 19  ALT 14  ALKPHOS 85  BILITOT 0.6  PROT 8.2*  ALBUMIN 4.0    Recent Labs  Lab 01/13/17 1309  LIPASE 21    Cardiac Enzymes: Recent Labs  Lab 01/14/17 0841 01/14/17 1423  TROPONINI <0.03 <0.03     CBG: Recent Labs  Lab 01/13/17 1409 01/14/17 0739  01/14/17 1602 01/14/17 2240 01/15/17 0730  GLUCAP 213* 100* 274* 195* 170*    Thyroid Function Tests: Recent Labs    01/14/17 0841  TSH 1.863     Radiology Studies: Dg Chest 2 View  Result Date: 01/13/2017 CLINICAL DATA:  Generalized abdominal pain, shortness of Breath EXAM: CHEST  2 VIEW COMPARISON:  04/05/2016 FINDINGS: Cardiomegaly. Prior CABG. No confluent airspace opacities or effusions. No acute bony abnormality. IMPRESSION: Cardiomegaly.  No active disease. Electronically Signed   By: Rolm Baptise M.D.   On: 01/13/2017 16:45   Ct Angio Chest Pe W And/or Wo Contrast  Result Date: 01/13/2017 CLINICAL DATA:  76 year old female with shortness of breath and elevated D-dimer EXAM: CT ANGIOGRAPHY CHEST WITH CONTRAST TECHNIQUE: Multidetector CT imaging of the chest was performed using the standard protocol during bolus administration of intravenous contrast. Multiplanar CT image reconstructions and MIPs were obtained to evaluate the vascular anatomy. CONTRAST:  73mL ISOVUE-370 IOPAMIDOL (ISOVUE-370) INJECTION 76% COMPARISON:  Chest x-ray obtained earlier today FINDINGS: Cardiovascular: Adequate opacification of the pulmonary arteries to the proximal subsegmental level. There is no evidence of acute pulmonary embolus. Cardiomegaly with right-sided heart enlargement. Atherosclerotic calcifications present throughout the coronary arteries. Normal caliber thoracic aorta. Atherosclerotic calcifications present in the aorta. No pericardial effusion. Mediastinum/Nodes: 2.3 cm nodule in the inferior aspect of the left thyroid gland. No suspicious mediastinal lymphadenopathy or mediastinal mass. Tiny hiatal hernia. Lungs/Pleura: No focal airspace consolidation. There are scattered areas of subtle ground-glass attenuation airspace opacity in a peribronchovascular distribution in the right upper, middle and lower lobes. Trace dependent atelectasis. No suspicious pulmonary mass or nodule. Upper Abdomen:  Visualized upper abdominal organs are unremarkable. Musculoskeletal: No acute fracture or aggressive appearing lytic or blastic osseous lesion. Healed median sternotomy. Review of the MIP images confirms the above findings. IMPRESSION: 1. Negative for acute pulmonary embolus. 2. Nonspecific subtle multifocal patches of ground-glass attenuation airspace opacity throughout the right lung. Findings appear to be in a peribronchovascular distribution. This is favored to reflect an atypical or viral upper respiratory infection. Additional considerations include early pneumonitis/ alveolitis, and potentially very early asymmetric pulmonary edema. 3. Incidentally noted 2.3 cm left inferior thyroid nodule. Recommend further evaluation with non urgent thyroid ultrasound. 4. Cardiomegaly with right-sided heart enlargement. 5. Coronary artery calcifications. Aortic Atherosclerosis (ICD10-I70.0). Electronically Signed   By: Jacqulynn Cadet M.D.   On: 01/13/2017 19:52     Medications:  Scheduled: . aspirin EC  81 mg Oral Daily  . calcitRIOL  0.25 mcg Oral Q M,W,F  . carvedilol  6.25 mg Oral BID  . cloNIDine  0.3 mg Oral BID  . enoxaparin (LOVENOX) injection  30 mg Subcutaneous Q24H  . furosemide  40 mg Intravenous Q12H  . gabapentin  100 mg Oral q morning - 10a  . gabapentin  200 mg Oral QHS  .  insulin aspart  0-15 Units Subcutaneous TID WC  . insulin aspart  0-5 Units Subcutaneous QHS  . insulin detemir  22 Units Subcutaneous Q2200  . pantoprazole  40 mg Oral Daily  . polyethylene glycol  17 g Oral BID  . psyllium  1 packet Oral TID  . senna  1 tablet Oral BID  . simvastatin  20 mg Oral QHS  . sodium chloride flush  3 mL Intravenous Q12H   Continuous: . sodium chloride     GEX:BMWUXL chloride, acetaminophen, bisacodyl, hydrALAZINE, ondansetron (ZOFRAN) IV, sodium chloride flush  Assessment/Plan:  Principal Problem:   Acute on chronic respiratory failure with hypoxia (HCC) Active Problems:    Essential hypertension   Hyperlipidemia   Abdominal pain   CKD (chronic kidney disease) stage 3, GFR 30-59 ml/min (HCC)   Diabetes mellitus with complication (HCC)   Diastolic CHF, acute on chronic (HCC)    Acute on chronic respiratory failure with hypoxia This is most likely secondary to CHF exacerbation.  Patient was placed on intravenous diuretics.  She is doing much better today.  Saturating normal on room air.   Acute on chronic diastolic CHF Patient with previous history of same.  She is followed by heart failure team as an outpatient and also by her regular cardiologist in [place].  She has had hospitalization for the same previously.  Patient was placed on diuretics.  Strict ins and outs and daily weights.  She has been diuresing some.  Weight measurements do not appear to be accurate.  Symptomatically she has improved.  Further management per cardiology.  She was also noted to be in atrial fibrillation which appears to be a new diagnosis for her.  This could be the reason for her CHF exacerbation.  Echocardiogram was done during this hospitalization and shows normal systolic function.    Atrial fibrillation Appears to be a new diagnosis for her based on previous cardiology notes.  Rate is reasonably well controlled.  TSH is normal. Could have been contributing to her symptoms.  To be started on Eliquis by cardiology.    Chest pain No ischemic changes noted on EKG.  Troponin normal x2.  CT angiogram of the chest did not show any PE.  Symptoms are most likely due to CHF.  Abdominal pain likely secondary to constipation She had a CT scan on 12/17 which did not show any acute findings.  Mainly showed constipation.  She denies any nausea vomiting.  Aggressive bowel regimen.  Patient had large bowel movements.  Feels better.  Chronic kidney disease stage III Creatinine noted to be slightly higher today.  Continue to monitor urine output.  May need to back off on diuretics.  Avoid  nephrotoxic agents.  History of essential hypertension Monitor blood pressures closely while she is getting diuretics.  Blood pressure seems to be well controlled.  Continue with her usual home medication regimen.  Incidental thyroid nodule This was noted on CT scan.  TSH showed normal.  This will need outpatient evaluation.  History of hyperlipidemia Continue with her statin.  Normocytic anemia Most likely due to chronic disease.  No evidence for overt bleeding.  Continue to monitor.  DVT Prophylaxis: Lovenox    Code Status: DNR Family Communication: Discussed with the patient Disposition Plan: Management as outlined above.    LOS: 1 day   [facility] Pager [phone] 01/15/2017, 8:10 AM  If 7PM-7AM, please contact night-coverage at [email or web address], password [facility]

## 2017-01-15 NOTE — Care Management Note (Signed)
Case Management Note  Patient Details  Name: Laura Best MRN: [id number] Date of Birth: September 14, 1940  Subjective/Objective:  From home with family, she has bsc and rolling walker, she has a PCP and medication coverage.  She chose [facility] for CHF diesease management.  Referral made to [facility] with [facility].  Soc will begin 24-48 hrs post dc.                    Action/Plan:  NCM will follow for dc needs.   Expected Discharge Date:  01/18/17               Expected Discharge Plan:  [place]  In-House Referral:     Discharge planning Services  CM Consult  Post Acute Care Choice:  Home Health Choice offered to:  Patient  DME Arranged:    DME Agency:     HH Arranged:  RN, Disease Management [place] Agency:  [place]  Status of Service:  Completed, signed off  If discussed at [place] of Stay Meetings, dates discussed:    Additional Comments:  Zenon Mayo, RN 01/15/2017, 10:15 AM

## 2017-01-16 LAB — BASIC METABOLIC PANEL
Anion gap: 7 (ref 5–15)
BUN: 28 mg/dL — AB (ref 6–20)
CALCIUM: 8.5 mg/dL — AB (ref 8.9–10.3)
CO2: 31 mmol/L (ref 22–32)
CREATININE: 1.92 mg/dL — AB (ref 0.44–1.00)
Chloride: 95 mmol/L — ABNORMAL LOW (ref 101–111)
GFR, EST AFRICAN AMERICAN: 28 mL/min — AB (ref >60)
GFR, EST NON AFRICAN AMERICAN: 24 mL/min — AB (ref >60)
Glucose, Bld: 243 mg/dL — ABNORMAL HIGH (ref 65–99)
Potassium: 4.1 mmol/L (ref 3.5–5.1)
SODIUM: 133 mmol/L — AB (ref 135–145)

## 2017-01-16 LAB — GLUCOSE, CAPILLARY
GLUCOSE-CAPILLARY: 166 mg/dL — AB (ref 65–99)
Glucose-Capillary: 136 mg/dL — ABNORMAL HIGH (ref 65–99)

## 2017-01-16 MED ORDER — FUROSEMIDE 40 MG PO TABS: 40.0000 mg | ORAL_TABLET | Freq: Every day | ORAL | 0 refills | Status: DC

## 2017-01-16 MED ORDER — SENNA 8.6 MG PO TABS: 1.0000 | ORAL_TABLET | Freq: Two times a day (BID) | ORAL | 0 refills | Status: DC

## 2017-01-16 MED ORDER — APIXABAN 5 MG PO TABS: 5.0000 mg | ORAL_TABLET | Freq: Two times a day (BID) | ORAL | 1 refills | Status: DC

## 2017-01-16 NOTE — Discharge Summary (Signed)
[facility]  Physician Discharge Summary   Patient ID: Laura Best MRN: [id number] DOB/AGE: Nov 13, 1940 76 y.o.  Admit date: 01/13/2017 Discharge date: 01/17/2017  PCP: Orlena Sheldon, PA-C  DISCHARGE DIAGNOSES:  Principal Problem:   Acute on chronic respiratory failure with hypoxia [facility]) Active Problems:   Essential hypertension   Hyperlipidemia   Abdominal pain   CKD (chronic kidney disease) stage 3, GFR 30-59 ml/min (HCC)   Diabetes mellitus with complication (HCC)   Diastolic CHF, acute on chronic (HCC)   Persistent atrial fibrillation (HCC)   RECOMMENDATIONS FOR OUTPATIENT FOLLOW UP: 1. Cardiology to arrange outpatient follow-up. 2. Needs further workup for thyroid nodule incidentally found during this hospitalization.   DISCHARGE CONDITION: fair  Diet recommendation: As before  Filed Weights   01/14/17 0517 01/15/17 0443 01/16/17 0520  Weight: 74.6 kg (164 lb 8 oz) 78.1 kg (172 lb 1.6 oz) 77.6 kg (171 lb 1.6 oz)    INITIAL HISTORY: 76 year old Caucasian female with a past medical history of diabetes mellitus type 2, hypertension, diastolic heart failure, stage III chronic kidney disease, COPD presented with complaints of abdominal distention, shortness of breath, swelling of her legs.  She also mentioned orthopnea and PND.  She was thought to have acute on chronic diastolic CHF.  She was hospitalized for further management.  Consultants: Cardiology  Procedures:  Transthoracic echocardiogram - Left ventricle: The cavity size was normal. Wall thickness was increased in a pattern of mild LVH. Systolic function was normal. The estimated ejection fraction was in the range of 55% to 60%. Wall motion was normal; there were no regional wall motion abnormalities. - Mitral valve: Calcified annulus. There was mild regurgitation. - Left atrium: The atrium was moderately dilated. - Right ventricle: The cavity size was mildly dilated.  Systolic function was mildly reduced. - Right atrium: The atrium was mildly dilated. - Pulmonary arteries: Systolic pressure was mildly increased.  Impressions:  - Normal LV systolic function; mild LVH; mild MR; moderate LAE; mild RAE and RVE; mild TR; mild pulmonary hypertension.   HOSPITAL COURSE:    Acute on chronic respiratory failure with hypoxia This is most likely secondary to CHF exacerbation.  Patient was placed on intravenous diuretics.  She diuresed well.  Saturating normal on room air now.  Acute on chronic diastolic CHF Patient with previous history of same.  She is followed by heart failure team as an outpatient and also by her regular cardiologist in [place].  She has had hospitalization for the same previously.  Patient was placed on diuretics. Symptomatically she has improved.  She was also noted to be in atrial fibrillation which appears to be a new diagnosis for her.  This could be the reason for her CHF exacerbation.  Echocardiogram was done during this hospitalization and shows normal systolic function.  She will be discharged on oral diuretics.  Atrial fibrillation Appears to be a new diagnosis for her based on previous cardiology notes.  Rate is reasonably well controlled.  TSH is normal. Could have been contributing to her symptoms.  Started on Eliquis by cardiology.    Chest pain No ischemic changes noted on EKG.  Troponin normal x2.  CT angiogram of the chest did not show any PE.  Symptoms are most likely due to CHF.  Abdominal pain likely secondary to constipation She had a CT scan on 12/17 which did not show any acute findings.  Mainly showed constipation.  She denies any nausea vomiting.  Aggressive bowel regimen.  Patient had  large bowel movements.  Feels better.  Chronic kidney disease stage III Creatinine noted to be slightly higher at discharge.  She will be sent home on oral diuretics.  Labs to be checked by cardiology as an  outpatient.  History of essential hypertension Monitor blood pressures closely while she is getting diuretics.  Blood pressure seems to be well controlled.  Continue with her usual home medication regimen.  Incidental thyroid nodule This was noted on CT scan.  TSH showed normal.  This will need outpatient evaluation.  History of hyperlipidemia Continue with her statin.  Normocytic anemia Most likely due to chronic disease.  No evidence for overt bleeding.   Overall stable.  Okay for discharge home today.     PERTINENT LABS:  The results of significant diagnostics from this hospitalization (including imaging, microbiology, ancillary and laboratory) are listed below for reference.     Labs: Basic Metabolic Panel: Recent Labs  Lab 01/13/17 1309 01/14/17 0018 01/15/17 0618 01/16/17 0513  NA 137 136 136 133*  K 3.6 3.8 4.0 4.1  CL 102 102 101 95*  CO2 '25 25 30 31  '$ GLUCOSE 239* 260* 212* 243*  BUN 25* 21* 24* 28*  CREATININE 1.62* 1.52* 1.88* 1.92*  CALCIUM 8.8* 8.3* 8.7* 8.5*   Liver Function Tests: Recent Labs  Lab 01/13/17 1309  AST 19  ALT 14  ALKPHOS 85  BILITOT 0.6  PROT 8.2*  ALBUMIN 4.0   Recent Labs  Lab 01/13/17 1309  LIPASE 21   CBC: Recent Labs  Lab 01/13/17 1309 01/14/17 0018 01/15/17 0618  WBC 9.8 6.7 4.8  NEUTROABS  --  4.9  --   HGB 12.8 11.0* 10.3*  HCT 39.2 34.6* 32.4*  MCV 87.1 88.5 89.0  PLT 186 152 146*   Cardiac Enzymes: Recent Labs  Lab 01/14/17 0841 01/14/17 1423  TROPONINI <0.03 <0.03   BNP: BNP (last 3 results) Recent Labs    04/05/16 1406 05/16/16 1230 01/13/17 1526  BNP 423.1* 327.7* 450.0*    CBG: Recent Labs  Lab 01/15/17 1216 01/15/17 1701 01/15/17 2101 01/16/17 0921 01/16/17 1118  GLUCAP 196* 159* 140* 166* 136*     IMAGING STUDIES Ct Abdomen Pelvis Wo Contrast  Result Date: 01/08/2017 CLINICAL DATA:  Abdominal bloating and constipation for 1 week. History of diabetes and hypertension  EXAM: CT ABDOMEN AND PELVIS WITHOUT CONTRAST TECHNIQUE: Multidetector CT imaging of the abdomen and pelvis was performed following the standard protocol without IV contrast. COMPARISON:  CT 02/17/2016 and 04/05/2016. FINDINGS: Lower chest: Stable mild scarring at both lung bases. There is mild cardiomegaly. No significant pleural or pericardial effusion. Hepatobiliary: The liver appears unremarkable as imaged in the noncontrast state. No evidence of gallstones, gallbladder wall thickening or biliary dilatation. Pancreas: Unremarkable. No pancreatic ductal dilatation or surrounding inflammatory changes. Spleen: Normal in size without focal abnormality. Adrenals/Urinary Tract: Both adrenal glands appear normal. Both kidneys demonstrate cortical thinning. There are renovascular calcifications bilaterally, but no definite urinary tract calculus, hydronephrosis or perinephric soft tissue stranding. The bladder is nearly empty and suboptimally evaluated. Stomach/Bowel: No evidence of bowel wall thickening, distention or surrounding inflammatory change. The cecum is located in the right mid abdomen. There is a fatty ileocecal valve. Pericecal surgical clips suggest previous appendectomy. There is moderate stool throughout the colon. Mild sigmoid diverticulosis. Vascular/Lymphatic: There are no enlarged abdominal or pelvic lymph nodes. Diffuse aortic and branch vessel atherosclerosis. Reproductive: The uterus and adnexa appear unremarkable. No evidence of adnexal mass. Other: Postsurgical changes in  the anterior abdominal wall with stable small periumbilical hernias containing only fat. No new hernia or ascites. Musculoskeletal: No acute or significant osseous findings. Lower lumbar spondylosis, stable. IMPRESSION: 1. No acute findings or clear explanation for the patient's symptoms. 2. Stable appearance of the anterior abdominal wall with postsurgical changes and small periumbilical hernias containing only fat. 3.  Prominent stool throughout the colon suggesting constipation. Cecum is located in the mid abdomen as before. 4. Renal cortical thinning bilaterally. 5.  Aortic Atherosclerosis (ICD10-I70.0). Electronically Signed   By: Richardean Sale M.D.   On: 01/08/2017 16:19   Dg Chest 2 View  Result Date: 01/13/2017 CLINICAL DATA:  Generalized abdominal pain, shortness of Breath EXAM: CHEST  2 VIEW COMPARISON:  04/05/2016 FINDINGS: Cardiomegaly. Prior CABG. No confluent airspace opacities or effusions. No acute bony abnormality. IMPRESSION: Cardiomegaly.  No active disease. Electronically Signed   By: Rolm Baptise M.D.   On: 01/13/2017 16:45   Ct Angio Chest Pe W And/or Wo Contrast  Result Date: 01/13/2017 CLINICAL DATA:  76 year old female with shortness of breath and elevated D-dimer EXAM: CT ANGIOGRAPHY CHEST WITH CONTRAST TECHNIQUE: Multidetector CT imaging of the chest was performed using the standard protocol during bolus administration of intravenous contrast. Multiplanar CT image reconstructions and MIPs were obtained to evaluate the vascular anatomy. CONTRAST:  6m ISOVUE-370 IOPAMIDOL (ISOVUE-370) INJECTION 76% COMPARISON:  Chest x-ray obtained earlier today FINDINGS: Cardiovascular: Adequate opacification of the pulmonary arteries to the proximal subsegmental level. There is no evidence of acute pulmonary embolus. Cardiomegaly with right-sided heart enlargement. Atherosclerotic calcifications present throughout the coronary arteries. Normal caliber thoracic aorta. Atherosclerotic calcifications present in the aorta. No pericardial effusion. Mediastinum/Nodes: 2.3 cm nodule in the inferior aspect of the left thyroid gland. No suspicious mediastinal lymphadenopathy or mediastinal mass. Tiny hiatal hernia. Lungs/Pleura: No focal airspace consolidation. There are scattered areas of subtle ground-glass attenuation airspace opacity in a peribronchovascular distribution in the right upper, middle and lower lobes.  Trace dependent atelectasis. No suspicious pulmonary mass or nodule. Upper Abdomen: Visualized upper abdominal organs are unremarkable. Musculoskeletal: No acute fracture or aggressive appearing lytic or blastic osseous lesion. Healed median sternotomy. Review of the MIP images confirms the above findings. IMPRESSION: 1. Negative for acute pulmonary embolus. 2. Nonspecific subtle multifocal patches of ground-glass attenuation airspace opacity throughout the right lung. Findings appear to be in a peribronchovascular distribution. This is favored to reflect an atypical or viral upper respiratory infection. Additional considerations include early pneumonitis/ alveolitis, and potentially very early asymmetric pulmonary edema. 3. Incidentally noted 2.3 cm left inferior thyroid nodule. Recommend further evaluation with non urgent thyroid ultrasound. 4. Cardiomegaly with right-sided heart enlargement. 5. Coronary artery calcifications. Aortic Atherosclerosis (ICD10-I70.0). Electronically Signed   By: HJacqulynn CadetM.D.   On: 01/13/2017 19:52    DISCHARGE EXAMINATION: Vitals:   01/15/17 0900 01/15/17 1300 01/15/17 2029 01/16/17 0520  BP: (!) 119/56 (!) 116/46 (!) 149/62 117/90  Pulse: 72 (!) 58 66 67  Resp: '18 18 18 18  '$ Temp: 98 F (36.7 C) 97.8 F (36.6 C) 98 F (36.7 C) 97.7 F (36.5 C)  TempSrc: Oral Oral Oral Oral  SpO2: 98% 96% 98% 98%  Weight:    77.6 kg (171 lb 1.6 oz)  Height:       General appearance: alert, cooperative, appears stated age and no distress Resp: Improved air entry bilaterally.  Few crackles at bases.  No wheezing or rhonchi.  Normal effort. Cardio: S1-S2 is irregularly irregular.  Normal rate. GI:  soft, non-tender; bowel sounds normal; no masses,  no organomegaly  DISPOSITION: Home with home health  Discharge Instructions    (HEART FAILURE PATIENTS) Call MD:  Anytime you have any of the following symptoms: 1) 3 pound weight gain in 24 hours or 5 pounds in 1 week 2)  shortness of breath, with or without a dry hacking cough 3) swelling in the hands, feet or stomach 4) if you have to sleep on extra pillows at night in order to breathe.   Complete by:  As directed    Call MD for:  difficulty breathing, headache or visual disturbances   Complete by:  As directed    Call MD for:  extreme fatigue   Complete by:  As directed    Call MD for:  persistant dizziness or light-headedness   Complete by:  As directed    Call MD for:  persistant nausea and vomiting   Complete by:  As directed    Call MD for:  severe uncontrolled pain   Complete by:  As directed    Call MD for:  temperature >100.4   Complete by:  As directed    Diet - low sodium heart healthy   Complete by:  As directed    Diet Carb Modified   Complete by:  As directed    Discharge instructions   Complete by:  As directed    Cardiology will make you an outpatient appointment for next week.  Take your medications as prescribed.  You were cared for by a hospitalist during your hospital stay. If you have any questions about your discharge medications or the care you received while you were in the hospital after you are discharged, you can call the unit and asked to speak with the hospitalist on call if the hospitalist that took care of you is not available. Once you are discharged, your primary care physician will handle any further medical issues. Please note that NO REFILLS for any discharge medications will be authorized once you are discharged, as it is imperative that you return to your primary care physician (or establish a relationship with a primary care physician if you do not have one) for your aftercare needs so that they can reassess your need for medications and monitor your lab values. If you do not have a primary care physician, you can call [phone] for a physician referral.   Increase activity slowly   Complete by:  As directed         Allergies as of 01/16/2017      Reactions   Ace  Inhibitors Other (See Comments)   Hyperkalemia   Actos [pioglitazone] Swelling   Aspirin Other (See Comments)   G.I. Upset. In high doses   Metformin And Related Diarrhea      Medication List    STOP taking these medications   aspirin EC 81 MG tablet     TAKE these medications   acetaminophen 500 MG tablet Commonly known as:  TYLENOL Take 500 mg by mouth every 6 (six) hours as needed for pain.   amLODipine 10 MG tablet Commonly known as:  NORVASC TAKE ONE TABLET BY MOUTH ONCE DAILY What changed:    how much to take  how to take this  when to take this   apixaban 5 MG Tabs tablet Commonly known as:  ELIQUIS Take 1 tablet (5 mg total) by mouth 2 (two) times daily.   bisacodyl 5 MG EC tablet Commonly known as:  DULCOLAX  Take 5 mg by mouth daily as needed for mild constipation or moderate constipation.   calcitRIOL 0.25 MCG capsule Commonly known as:  ROCALTROL Take 0.25 mcg by mouth every Monday, Wednesday, and Friday.   carvedilol 6.25 MG tablet Commonly known as:  COREG TAKE 1 TABLET BY MOUTH TWICE DAILY What changed:    how much to take  how to take this  when to take this   cloNIDine 0.3 MG tablet Commonly known as:  CATAPRES Take 1 tablet (0.3 mg total) by mouth 2 (two) times daily.   furosemide 40 MG tablet Commonly known as:  LASIX Take 1 tablet (40 mg total) by mouth daily. What changed:    how much to take  how to take this  when to take this   gabapentin 100 MG capsule Commonly known as:  NEURONTIN TAKE 1 TO 2 CAPSULES BY MOUTH AT BEDTIME FOR  PERIPHERAL  NEUROPATHY. What changed:  See the new instructions.   glucose blood test strip Use as directed. Check blood sugar 3 times daily before meals and at bedtime.   insulin aspart 100 UNIT/ML FlexPen Commonly known as:  NOVOLOG Take 7 units with luch and supper.  Inject 15 minutes prior to meal or immediately after meal.   LEVEMIR FLEXTOUCH 100 UNIT/ML Pen Generic drug:  Insulin  Detemir INJECT 22 UNITS SUBCUTANEOUSLY ONCE DAILY AT 10 PM   omeprazole 20 MG capsule Commonly known as:  PRILOSEC TAKE ONE CAPSULE BY MOUTH ONCE DAILY What changed:    how much to take  how to take this  when to take this   [place] KIT w/Device Kit 1 meter   onetouch ultrasoft lancets Use as instructed   polyethylene glycol packet Commonly known as:  MIRALAX / GLYCOLAX Take 17 g by mouth daily. What changed:    when to take this  reasons to take this   psyllium 95 % Pack Commonly known as:  HYDROCIL/METAMUCIL Take 1 packet by mouth 3 (three) times daily.   RELION SHORT PEN NEEDLES 31G X 8 MM Misc Generic drug:  Insulin Pen Needle USE ONE PEN NEEDLE ONCE DAILY AS DIRECTED   senna 8.6 MG Tabs tablet Commonly known as:  SENOKOT Take 1 tablet (8.6 mg total) by mouth 2 (two) times daily.   simvastatin 20 MG tablet Commonly known as:  ZOCOR TAKE ONE TABLET BY MOUTH ONCE DAILY AT BEDTIME What changed:    how much to take  how to take this  when to take this   Vitamin D 2000 units Caps Take 2,000 Units by mouth daily.        Follow-up Information    Branch, Alphonse Guild, MD Follow up.   Specialty:  Cardiology Why:  cardiology office will contact you to arrange outpatient visit, please give [place] a call if you do not hear from our scheduler in 3 business days Contact information: [address] [place] [address] [phone]           TOTAL DISCHARGE TIME: 35 mins  [facility] Pager [phone]  01/17/2017, 2:26 PM

## 2017-01-16 NOTE — Progress Notes (Addendum)
Progress Note  Patient Name: Laura Best Date of Encounter: 01/16/2017  Primary Cardiologist: Carlyle Dolly, MD   Subjective   Breathing is OK  No CP    Inpatient Medications    Scheduled Meds: . apixaban  5 mg Oral BID  . calcitRIOL  0.25 mcg Oral Q M,W,F  . carvedilol  6.25 mg Oral BID  . cloNIDine  0.3 mg Oral BID  . furosemide  40 mg Intravenous Q12H  . gabapentin  100 mg Oral q morning - 10a  . gabapentin  200 mg Oral QHS  . insulin aspart  0-15 Units Subcutaneous TID WC  . insulin aspart  0-5 Units Subcutaneous QHS  . insulin detemir  22 Units Subcutaneous Q2200  . pantoprazole  40 mg Oral Daily  . polyethylene glycol  17 g Oral BID  . psyllium  1 packet Oral TID  . senna  1 tablet Oral BID  . simvastatin  20 mg Oral QHS  . sodium chloride flush  3 mL Intravenous Q12H   Continuous Infusions: . sodium chloride     PRN Meds: sodium chloride, acetaminophen, bisacodyl, hydrALAZINE, ondansetron (ZOFRAN) IV, sodium chloride flush   Vital Signs    Vitals:   01/15/17 0900 01/15/17 1300 01/15/17 2029 01/16/17 0520  BP: (!) 119/56 (!) 116/46 (!) 149/62 117/90  Pulse: 72 (!) 58 66 67  Resp: 18 18 18 18   Temp: 98 F (36.7 C) 97.8 F (36.6 C) 98 F (36.7 C) 97.7 F (36.5 C)  TempSrc: Oral Oral Oral Oral  SpO2: 98% 96% 98% 98%  Weight:    171 lb 1.6 oz (77.6 kg)  Height:        Intake/Output Summary (Last 24 hours) at 01/16/2017 0454 Last data filed at 01/15/2017 1800 Gross per 24 hour  Intake 720 ml  Output 1400 ml  Net -680 ml   Filed Weights   01/14/17 0517 01/15/17 0443 01/16/17 0520  Weight: 164 lb 8 oz (74.6 kg) 172 lb 1.6 oz (78.1 kg) 171 lb 1.6 oz (77.6 kg)    Telemetry    [place] fib 60s to 100 - Personally Reviewed  ECG      Physical Exam   GEN: No acute distress.   Neck: No JVD Cardiac: irreg Irreg  , no murmurs, rubs, or gallops.  Respiratory: Clear to auscultation bilaterally. GI: Soft, nontender, non-distended  MS: No  edema; No deformity. Neuro:  Nonfocal  Psych: Normal affect   Labs    Chemistry Recent Labs  Lab 01/13/17 1309 01/14/17 0018 01/15/17 0618 01/16/17 0513  NA 137 136 136 133*  K 3.6 3.8 4.0 4.1  CL 102 102 101 95*  CO2 25 25 30 31   GLUCOSE 239* 260* 212* 243*  BUN 25* 21* 24* 28*  CREATININE 1.62* 1.52* 1.88* 1.92*  CALCIUM 8.8* 8.3* 8.7* 8.5*  PROT 8.2*  --   --   --   ALBUMIN 4.0  --   --   --   AST 19  --   --   --   ALT 14  --   --   --   ALKPHOS 85  --   --   --   BILITOT 0.6  --   --   --   GFRNONAA 30* 32* 25* 24*  GFRAA 34* 37* 29* 28*  ANIONGAP 10 9 5 7      Hematology Recent Labs  Lab 01/13/17 1309 01/14/17 0018 01/15/17 0618  WBC 9.8 6.7 4.8  RBC 4.50 3.91 3.64*  HGB 12.8 11.0* 10.3*  HCT 39.2 34.6* 32.4*  MCV 87.1 88.5 89.0  MCH 28.4 28.1 28.3  MCHC 32.7 31.8 31.8  RDW 14.5 15.0 14.7  PLT 186 152 146*    Cardiac Enzymes Recent Labs  Lab 01/14/17 0841 01/14/17 1423  TROPONINI <0.03 <0.03    Recent Labs  Lab 01/13/17 1530  TROPIPOC 0.00     BNP Recent Labs  Lab 01/13/17 1526  BNP 450.0*     DDimer No results for input(s): DDIMER in the last 168 hours.   Radiology    No results found.  Cardiac Studies    Patient Profile       Assessment & Plan    1  Acute on chronic diastolic CHF  Volume status looks pretty good   Follow   2  Atrial fibrillation  Rate controlled at rest  On Eliquis    Will f/u with HR with walking   I watched her walk  HR 100s  (some PVCs with afib)  Did OK WOuld plan for d/c today WIll make sure pt has f/u in our [place] clinic in 1 week If does OK from breathing standpoint plan for cardioversion in 1 month  If SOB will change to TEE/ DCCV earlier  3  Chest pain  Follow  Denies    4  CKD  Stage 3  BUN / Cr increased from yesterday  28 / 1.92    Switch to 40 daily  Pt weighs daily  Can follow as outpt  If wt goes up add 1 1/2 lasix (60 ) as needed     For questions or updates, please contact  [place] Please consult [email or web address] for contact info under Cardiology/STEMI.      Signed, Dorris Carnes, MD  01/16/2017, 8:22 AM

## 2017-01-16 NOTE — Progress Notes (Signed)
Patient discharge to home, son at bedside. Discharged instructions and follow up appointments discussed with patient, verbalized understanding. PIV removed no s/s of swelling and infiltration.

## 2017-01-17 LAB — GLUCOSE, CAPILLARY
GLUCOSE-CAPILLARY: 175 mg/dL — AB (ref 65–99)
GLUCOSE-CAPILLARY: 145 mg/dL — AB (ref 65–99)

## 2017-01-18 ENCOUNTER — Telehealth: Payer: Self-pay | Admitting: Cardiology

## 2017-01-18 NOTE — Telephone Encounter (Signed)
Spoke with patient she states she is doing better and is having transportation problems so she is not able to come in at this time, but will keep an eye on things and if her swelling gets worse then she will come in.

## 2017-01-18 NOTE — Telephone Encounter (Signed)
Forwarded staff message to Dr Harl Bowie

## 2017-01-18 NOTE — Telephone Encounter (Signed)
Received message  Please contact the patient and schedule 1 week followup with Dr. Harl Bowie or his APP. Discharging from [facility] today. Admission for heart failure. THanks   Signed,  Almyra Deforest PA  Pager: [phone]   Called Mrs. Longhi to see if she could come to our [place] office on 01-19-17 to see Dr. Harl Bowie. She declined stating that Due to lack of transportation she cannot get to our [place] office. Will forward .

## 2017-01-19 NOTE — Telephone Encounter (Signed)
Per Dr Harl Bowie 01/25/17 [place] office @ 2pm - pt made aware

## 2017-01-22 DIAGNOSIS — J9621 Acute and chronic respiratory failure with hypoxia: Secondary | ICD-10-CM | POA: Diagnosis not present

## 2017-01-22 DIAGNOSIS — E785 Hyperlipidemia, unspecified: Secondary | ICD-10-CM | POA: Diagnosis not present

## 2017-01-22 DIAGNOSIS — M1991 Primary osteoarthritis, unspecified site: Secondary | ICD-10-CM | POA: Diagnosis not present

## 2017-01-22 DIAGNOSIS — I5033 Acute on chronic diastolic (congestive) heart failure: Secondary | ICD-10-CM | POA: Diagnosis not present

## 2017-01-22 DIAGNOSIS — N183 Chronic kidney disease, stage 3 (moderate): Secondary | ICD-10-CM | POA: Diagnosis not present

## 2017-01-22 DIAGNOSIS — Z87891 Personal history of nicotine dependence: Secondary | ICD-10-CM | POA: Diagnosis not present

## 2017-01-22 DIAGNOSIS — E1122 Type 2 diabetes mellitus with diabetic chronic kidney disease: Secondary | ICD-10-CM | POA: Diagnosis not present

## 2017-01-22 DIAGNOSIS — I4891 Unspecified atrial fibrillation: Secondary | ICD-10-CM | POA: Diagnosis not present

## 2017-01-22 DIAGNOSIS — I130 Hypertensive heart and chronic kidney disease with heart failure and stage 1 through stage 4 chronic kidney disease, or unspecified chronic kidney disease: Secondary | ICD-10-CM | POA: Diagnosis not present

## 2017-01-22 DIAGNOSIS — J449 Chronic obstructive pulmonary disease, unspecified: Secondary | ICD-10-CM | POA: Diagnosis not present

## 2017-01-24 ENCOUNTER — Encounter: Payer: Self-pay | Admitting: Physician Assistant

## 2017-01-24 ENCOUNTER — Other Ambulatory Visit: Payer: Self-pay

## 2017-01-24 ENCOUNTER — Ambulatory Visit (INDEPENDENT_AMBULATORY_CARE_PROVIDER_SITE_OTHER): Payer: Medicare HMO | Admitting: Physician Assistant

## 2017-01-24 VITALS — BP 130/62 | HR 58 | Temp 97.9°F | Resp 16 | Wt 176.0 lb

## 2017-01-24 DIAGNOSIS — K5909 Other constipation: Secondary | ICD-10-CM | POA: Diagnosis not present

## 2017-01-24 DIAGNOSIS — N3941 Urge incontinence: Secondary | ICD-10-CM

## 2017-01-24 DIAGNOSIS — Z09 Encounter for follow-up examination after completed treatment for conditions other than malignant neoplasm: Secondary | ICD-10-CM

## 2017-01-24 MED ORDER — MIRABEGRON ER 50 MG PO TB24: 50.0000 mg | ORAL_TABLET | Freq: Every day | ORAL | 5 refills | Status: DC

## 2017-01-24 MED ORDER — LINACLOTIDE 145 MCG PO CAPS: 145.0000 ug | ORAL_CAPSULE | Freq: Every day | ORAL | 5 refills | Status: DC

## 2017-01-24 NOTE — Progress Notes (Signed)
Patient ID: Laura Best MRN: [id number], DOB: 07/22/1940, 77 y.o. Date of Encounter: '@DATE'$ @  Chief Complaint:  Chief Complaint  Patient presents with  . Hospitalization Follow-up    HPI: 77 y.o. year old female  presents for f/u after recent hospitalization.  Reviewed hospital discharge summary.  Hospitalized 01/13/17 through 01/17/17.  Presented with complaints of abdominal distention, shortness of breath, swelling of her legs.  Also orthopnea and PND.  Felt to have acute on chronic diastolic heart failure. Also she was found to be in atrial fibrillation.  Seemed to be a new diagnosis.  Was felt this could be contributing to her symptoms.  Started on Eliquis by cardiology. She was placed on intravenous diuretics.  Then titrated to oral diuretics.  Symptomatically was improved.  She reports that she has a follow-up appointment with cardiology tomorrow.  Dr. Harl Bowie at the [facility] office tomorrow. She reports that she feels that her swelling is improved and her breathing is improved.  Prior to her recent hospitalization, she had office visit with me--At that OV, she was experiencing discomfort in her left abdomen.  CT was obtained.  Showed findings consistent with constipation but no other findings to explain symptoms. Today she reports "my stomach is still swollen ". "Still not moving my bowels like out all to." "That Senokot is just a stool softener and is not strong enough." Also reports that she wears Depends. Reports that frequently she will leak some urine before she can get to the bathroom fast enough.  Has urge incontinence.  No other concerns to address today.   Past Medical History:  Diagnosis Date  . Arthritis    "hands, feet" (02/17/2016)  . CKD (chronic kidney disease) stage 3, GFR 30-59 ml/min (HCC) 12/25/2012  . COPD (chronic obstructive pulmonary disease) ([place])   . Diastolic heart failure ([place])   . Hyperlipidemia   . Hypertension   . Osteoporosis   .  Pneumonia    "several times" (02/17/2016)  . Small bowel obstruction ([place]) 02/17/2016  . Tricuspid regurgitation    Echo 05/02/06-Nml LV. Mild MR. Mod TR  . Type II diabetes mellitus ([place])   . Vitamin D deficiency      Home Meds: Outpatient Medications Prior to Visit  Medication Sig Dispense Refill  . acetaminophen (TYLENOL) 500 MG tablet Take 500 mg by mouth every 6 (six) hours as needed for pain.    Marland Kitchen amLODipine (NORVASC) 10 MG tablet TAKE ONE TABLET BY MOUTH ONCE DAILY (Patient taking differently: TAKE ONE TABLET ('10MG'$ ) BY MOUTH ONCE DAILY) 90 tablet 3  . apixaban (ELIQUIS) 5 MG TABS tablet Take 1 tablet (5 mg total) by mouth 2 (two) times daily. 60 tablet 1  . Blood Glucose Monitoring Suppl (ONE TOUCH ULTRA SYSTEM KIT) w/Device KIT 1 meter 1 each 0  . calcitRIOL (ROCALTROL) 0.25 MCG capsule Take 0.25 mcg by mouth every Monday, Wednesday, and Friday.     . carvedilol (COREG) 6.25 MG tablet TAKE 1 TABLET BY MOUTH TWICE DAILY (Patient taking differently: TAKE 1 TABLET (6.25 MG) BY MOUTH TWICE DAILY) 60 tablet 3  . Cholecalciferol (VITAMIN D) 2000 UNITS CAPS Take 2,000 Units by mouth daily.     . cloNIDine (CATAPRES) 0.3 MG tablet Take 1 tablet (0.3 mg total) by mouth 2 (two) times daily. 60 tablet 3  . furosemide (LASIX) 40 MG tablet Take 1 tablet (40 mg total) by mouth daily. 30 tablet 0  . gabapentin (NEURONTIN) 100 MG capsule TAKE 1 TO 2  CAPSULES BY MOUTH AT BEDTIME FOR  PERIPHERAL  NEUROPATHY. (Patient taking differently: TAKE 1 CAPSULES (100 MG) BY MOUTH IN THE MORNING FOR  PERIPHERAL  NEUROPATHY.) 120 capsule 0  . glucose blood test strip Use as directed. Check blood sugar 3 times daily before meals and at bedtime. 100 each 12  . insulin aspart (NOVOLOG) 100 UNIT/ML FlexPen Take 7 units with luch and supper.  Inject 15 minutes prior to meal or immediately after meal. 15 mL 11  . Lancets (ONETOUCH ULTRASOFT) lancets Use as instructed 100 each 12  . LEVEMIR FLEXTOUCH 100 UNIT/ML Pen INJECT  22 UNITS SUBCUTANEOUSLY ONCE DAILY AT 10 PM 15 mL 0  . omeprazole (PRILOSEC) 20 MG capsule TAKE ONE CAPSULE BY MOUTH ONCE DAILY (Patient taking differently: TAKE ONE CAPSULE (20 MG) BY MOUTH ONCE DAILY) 90 capsule 3  . polyethylene glycol (MIRALAX / GLYCOLAX) packet Take 17 g by mouth daily. (Patient taking differently: Take 17 g by mouth daily as needed for mild constipation. ) 14 each 0  . psyllium (HYDROCIL/METAMUCIL) 95 % PACK Take 1 packet by mouth 3 (three) times daily. 56 each 0  . RELION SHORT PEN NEEDLES 31G X 8 MM MISC USE ONE PEN NEEDLE ONCE DAILY AS DIRECTED 50 each 11  . senna (SENOKOT) 8.6 MG TABS tablet Take 1 tablet (8.6 mg total) by mouth 2 (two) times daily. 120 each 0  . simvastatin (ZOCOR) 20 MG tablet TAKE ONE TABLET BY MOUTH ONCE DAILY AT BEDTIME (Patient taking differently: TAKE ONE TABLET ('20MG'$ ) BY MOUTH ONCE DAILY AT BEDTIME) 90 tablet 3  . bisacodyl (DULCOLAX) 5 MG EC tablet Take 5 mg by mouth daily as needed for mild constipation or moderate constipation.     No facility-administered medications prior to visit.     Allergies:  Allergies  Allergen Reactions  . Ace Inhibitors Other (See Comments)    Hyperkalemia  . Actos [Pioglitazone] Swelling  . Aspirin Other (See Comments)    G.I. Upset. In high doses  . Metformin And Related Diarrhea    Social History   Socioeconomic History  . Marital status: Widowed    Spouse name: Not on file  . Number of children: Not on file  . Years of education: Not on file  . Highest education level: Not on file  Social Needs  . Financial resource strain: Not on file  . Food insecurity - worry: Not on file  . Food insecurity - inability: Not on file  . Transportation needs - medical: Not on file  . Transportation needs - non-medical: Not on file  Occupational History  . Occupation: retired  Tobacco Use  . Smoking status: Former Smoker    Packs/day: 0.50    Years: 50.00    Pack years: 25.00    Types: Cigarettes    Last  attempt to quit: 01/19/2009    Years since quitting: 8.0  . Smokeless tobacco: Never Used  Substance and Sexual Activity  . Alcohol use: No    Alcohol/week: 0.0 oz  . Drug use: No  . Sexual activity: No  Other Topics Concern  . Not on file  Social History Narrative   Entered 10/2013:   She has never driven.   She lives with her son.    Family History  Problem Relation Age of Onset  . Cancer Father   . Diabetes Father   . Cancer Brother        Brain  . Cancer Sister  abdominal fat     Review of Systems:  See HPI for pertinent ROS. All other ROS negative.    Physical Exam: Blood pressure 130/62, pulse (!) 58, temperature 97.9 F (36.6 C), temperature source Oral, resp. rate 16, weight 79.8 kg (176 lb), SpO2 96 %., Body mass index is 31.18 kg/m. General: WNWD WF. Appears in no acute distress. Neck: Supple. No thyromegaly. No lymphadenopathy. Lungs: Clear bilaterally to auscultation without wheezes, rales, or rhonchi. Breathing is unlabored. Heart: Irregular Rhythm Abdomen: Soft, non-tender, non-distended with normoactive bowel sounds. No hepatomegaly. No rebound/guarding. No obvious abdominal masses. Musculoskeletal:  Strength and tone normal for age. Extremities/Skin: No - trace LE edema. Neuro: Alert and oriented X 3. Moves all extremities spontaneously. Gait is normal. CNII-XII grossly in tact. Psych:  Responds to questions appropriately with a normal affect.     ASSESSMENT AND PLAN:  77 y.o. year old female with  1. Hospital discharge follow-up She also has follow-up appointment with Dr. Harl Bowie, Cardiology, in [place] tomorrow.  2. Chronic constipation Today I gave her a sample bottle of the Linzess 145 mcg.  Told her to try this sample bottle first.  Take 1 each morning prior to food or drink.  Explained that if this dose is too strong and causes stool to be too loose to let me know and I will decrease the dose to the 72 mcg dose. - linaclotide (LINZESS)  145 MCG CAPS capsule; Take 1 capsule (145 mcg total) by mouth daily before breakfast.  Dispense: 30 capsule; Refill: 5  3. Urge incontinence Reports symptoms of stress incontinence.  She is on no medication for this.  Will add Myrbetriq once daily.  Also gave her 1 sample bottle of this to try prior to getting Rx filled. - mirabegron ER (MYRBETRIQ) 50 MG TB24 tablet; Take 1 tablet (50 mg total) by mouth daily.  Dispense: 30 tablet; Refill: [address] [place], [place], [facility] 01/24/2017 1:23 PM

## 2017-01-25 ENCOUNTER — Encounter: Payer: Self-pay | Admitting: Cardiology

## 2017-01-25 ENCOUNTER — Ambulatory Visit (INDEPENDENT_AMBULATORY_CARE_PROVIDER_SITE_OTHER): Payer: Medicare HMO | Admitting: Cardiology

## 2017-01-25 VITALS — BP 130/72 | HR 73 | Ht 63.0 in | Wt 176.0 lb

## 2017-01-25 DIAGNOSIS — I10 Essential (primary) hypertension: Secondary | ICD-10-CM | POA: Diagnosis not present

## 2017-01-25 DIAGNOSIS — J449 Chronic obstructive pulmonary disease, unspecified: Secondary | ICD-10-CM | POA: Diagnosis not present

## 2017-01-25 DIAGNOSIS — M1991 Primary osteoarthritis, unspecified site: Secondary | ICD-10-CM | POA: Diagnosis not present

## 2017-01-25 DIAGNOSIS — J9621 Acute and chronic respiratory failure with hypoxia: Secondary | ICD-10-CM | POA: Diagnosis not present

## 2017-01-25 DIAGNOSIS — I4891 Unspecified atrial fibrillation: Secondary | ICD-10-CM

## 2017-01-25 DIAGNOSIS — N183 Chronic kidney disease, stage 3 (moderate): Secondary | ICD-10-CM | POA: Diagnosis not present

## 2017-01-25 DIAGNOSIS — I5033 Acute on chronic diastolic (congestive) heart failure: Secondary | ICD-10-CM | POA: Diagnosis not present

## 2017-01-25 DIAGNOSIS — I272 Pulmonary hypertension, unspecified: Secondary | ICD-10-CM

## 2017-01-25 DIAGNOSIS — I5032 Chronic diastolic (congestive) heart failure: Secondary | ICD-10-CM

## 2017-01-25 DIAGNOSIS — Z79899 Other long term (current) drug therapy: Secondary | ICD-10-CM

## 2017-01-25 DIAGNOSIS — E785 Hyperlipidemia, unspecified: Secondary | ICD-10-CM | POA: Diagnosis not present

## 2017-01-25 DIAGNOSIS — I130 Hypertensive heart and chronic kidney disease with heart failure and stage 1 through stage 4 chronic kidney disease, or unspecified chronic kidney disease: Secondary | ICD-10-CM | POA: Diagnosis not present

## 2017-01-25 DIAGNOSIS — Z87891 Personal history of nicotine dependence: Secondary | ICD-10-CM | POA: Diagnosis not present

## 2017-01-25 DIAGNOSIS — E1122 Type 2 diabetes mellitus with diabetic chronic kidney disease: Secondary | ICD-10-CM | POA: Diagnosis not present

## 2017-01-25 MED ORDER — FUROSEMIDE 40 MG PO TABS: ORAL_TABLET | ORAL | 3 refills | Status: DC

## 2017-01-25 NOTE — Progress Notes (Signed)
Clinical Summary Laura Best is a 77 y.o.female seen today for follow up of the following medical problems.   1. Chronic diastolic heart failure - recent admit 12/2016 with acute on chronic diastolic HF. Diuresed, discharge weigh 171 lbs - home weights stable around 172 lbs. Has had some LE edema since discharge. Compliant with meds. Has noted some increase of SOB since discharge.   2. Afib - new diagnosis during 12/2016 admission.  - started on eliquis - occasional palpitations, infrequent. No bleeding on eliquis.   2. HTN - Cr 2.07 BUN 77 GFR 23, from notes there is a question of possible right renal artery stenosis.   - home bp's 120-130s/60s - compliant with meds  3. Bradycardia   - heart rates mid 50s at home. Denies any lightheadedness or dizziness.   4. Pulmonary venous HTN - RHC as reported below.  - no recent symptoms  5. History of ASD repair - no evidence of shunt by prior cath  6. CKD IV - followed by nephrology - renal function has been stable  7. Hyperlipidemia - 08/2016: TC 162 TG 208 HDL 64 LDL 56 - compliant with statin Past Medical History:  Diagnosis Date  . Arthritis    "hands, feet" (02/17/2016)  . CKD (chronic kidney disease) stage 3, GFR 30-59 ml/min (HCC) 12/25/2012  . COPD (chronic obstructive pulmonary disease) ([place])   . Diastolic heart failure ([place])   . Hyperlipidemia   . Hypertension   . Osteoporosis   . Pneumonia    "several times" (02/17/2016)  . Small bowel obstruction ([place]) 02/17/2016  . Tricuspid regurgitation    Echo 05/02/06-Nml LV. Mild MR. Mod TR  . Type II diabetes mellitus ([place])   . Vitamin D deficiency      Allergies  Allergen Reactions  . Ace Inhibitors Other (See Comments)    Hyperkalemia  . Actos [Pioglitazone] Swelling  . Aspirin Other (See Comments)    G.I. Upset. In high doses  . Metformin And Related Diarrhea     Current Outpatient Medications  Medication Sig Dispense Refill  . acetaminophen  (TYLENOL) 500 MG tablet Take 500 mg by mouth every 6 (six) hours as needed for pain.    Marland Kitchen amLODipine (NORVASC) 10 MG tablet TAKE ONE TABLET BY MOUTH ONCE DAILY (Patient taking differently: TAKE ONE TABLET ('10MG'$ ) BY MOUTH ONCE DAILY) 90 tablet 3  . apixaban (ELIQUIS) 5 MG TABS tablet Take 1 tablet (5 mg total) by mouth 2 (two) times daily. 60 tablet 1  . Blood Glucose Monitoring Suppl (ONE TOUCH ULTRA SYSTEM KIT) w/Device KIT 1 meter 1 each 0  . calcitRIOL (ROCALTROL) 0.25 MCG capsule Take 0.25 mcg by mouth every Monday, Wednesday, and Friday.     . carvedilol (COREG) 6.25 MG tablet TAKE 1 TABLET BY MOUTH TWICE DAILY (Patient taking differently: TAKE 1 TABLET (6.25 MG) BY MOUTH TWICE DAILY) 60 tablet 3  . Cholecalciferol (VITAMIN D) 2000 UNITS CAPS Take 2,000 Units by mouth daily.     . cloNIDine (CATAPRES) 0.3 MG tablet Take 1 tablet (0.3 mg total) by mouth 2 (two) times daily. 60 tablet 3  . furosemide (LASIX) 40 MG tablet Take 1 tablet (40 mg total) by mouth daily. 30 tablet 0  . gabapentin (NEURONTIN) 100 MG capsule TAKE 1 TO 2 CAPSULES BY MOUTH AT BEDTIME FOR  PERIPHERAL  NEUROPATHY. (Patient taking differently: TAKE 1 CAPSULES (100 MG) BY MOUTH IN THE MORNING FOR  PERIPHERAL  NEUROPATHY.) 120 capsule 0  .  glucose blood test strip Use as directed. Check blood sugar 3 times daily before meals and at bedtime. 100 each 12  . insulin aspart (NOVOLOG) 100 UNIT/ML FlexPen Take 7 units with luch and supper.  Inject 15 minutes prior to meal or immediately after meal. 15 mL 11  . Lancets (ONETOUCH ULTRASOFT) lancets Use as instructed 100 each 12  . LEVEMIR FLEXTOUCH 100 UNIT/ML Pen INJECT 22 UNITS SUBCUTANEOUSLY ONCE DAILY AT 10 PM 15 mL 0  . linaclotide (LINZESS) 145 MCG CAPS capsule Take 1 capsule (145 mcg total) by mouth daily before breakfast. 30 capsule 5  . mirabegron ER (MYRBETRIQ) 50 MG TB24 tablet Take 1 tablet (50 mg total) by mouth daily. 30 tablet 5  . omeprazole (PRILOSEC) 20 MG capsule TAKE  ONE CAPSULE BY MOUTH ONCE DAILY (Patient taking differently: TAKE ONE CAPSULE (20 MG) BY MOUTH ONCE DAILY) 90 capsule 3  . polyethylene glycol (MIRALAX / GLYCOLAX) packet Take 17 g by mouth daily. (Patient taking differently: Take 17 g by mouth daily as needed for mild constipation. ) 14 each 0  . psyllium (HYDROCIL/METAMUCIL) 95 % PACK Take 1 packet by mouth 3 (three) times daily. 56 each 0  . RELION SHORT PEN NEEDLES 31G X 8 MM MISC USE ONE PEN NEEDLE ONCE DAILY AS DIRECTED 50 each 11  . senna (SENOKOT) 8.6 MG TABS tablet Take 1 tablet (8.6 mg total) by mouth 2 (two) times daily. 120 each 0  . simvastatin (ZOCOR) 20 MG tablet TAKE ONE TABLET BY MOUTH ONCE DAILY AT BEDTIME (Patient taking differently: TAKE ONE TABLET (20MG) BY MOUTH ONCE DAILY AT BEDTIME) 90 tablet 3   No current facility-administered medications for this visit.      Past Surgical History:  Procedure Laterality Date  . APPENDECTOMY    . HEART CHAMBER REVISION     patched hole --[place]  . LAPAROSCOPIC APPENDECTOMY N/A 09/27/2013   Procedure: APPENDECTOMY - CONVERTED FROM LAPAROSCOPIC;  Surgeon: Donnie Mesa, MD;  Location: [facility];  Service: General;  Laterality: N/A;  . RIGHT/LEFT HEART CATH AND CORONARY ANGIOGRAPHY N/A 05/24/2016   Procedure: Right/Left Heart Cath and Coronary Angiography;  Surgeon: Larey Dresser, MD;  Location: [facility];  Service: Cardiovascular;  Laterality: N/A;  . TUBAL LIGATION       Allergies  Allergen Reactions  . Ace Inhibitors Other (See Comments)    Hyperkalemia  . Actos [Pioglitazone] Swelling  . Aspirin Other (See Comments)    G.I. Upset. In high doses  . Metformin And Related Diarrhea      Family History  Problem Relation Age of Onset  . Cancer Father   . Diabetes Father   . Cancer Brother        Brain  . Cancer Sister        abdominal fat     Social History Laura Best reports that she quit smoking about 8 years ago. Her smoking use included cigarettes. She has  a 25.00 pack-year smoking history. she has never used smokeless tobacco. Laura Best reports that she does not drink alcohol.   Review of Systems CONSTITUTIONAL: No weight loss, fever, chills, weakness or fatigue.  HEENT: Eyes: No visual loss, blurred vision, double vision or yellow sclerae.No hearing loss, sneezing, congestion, runny nose or sore throat.  SKIN: No rash or itching.  CARDIOVASCULAR: per hpi RESPIRATORY: per hpi GASTROINTESTINAL: No anorexia, nausea, vomiting or diarrhea. No abdominal pain or blood.  GENITOURINARY: No burning on urination, no polyuria  NEUROLOGICAL: No headache, dizziness, syncope, paralysis, ataxia, numbness or tingling in the extremities. No change in bowel or bladder control.  MUSCULOSKELETAL: No muscle, back pain, joint pain or stiffness.  LYMPHATICS: No enlarged nodes. No history of splenectomy.  PSYCHIATRIC: No history of depression or anxiety.  ENDOCRINOLOGIC: No reports of sweating, cold or heat intolerance. No polyuria or polydipsia.  Marland Kitchen   Physical Examination Vitals:   01/25/17 1402  BP: 130/72  Pulse: 73  SpO2: 92%   Filed Weights   01/25/17 1402  Weight: 176 lb (79.8 kg)    Gen: resting comfortably, no acute distress HEENT: no scleral icterus, pupils equal round and reactive, no palptable cervical adenopathy,  CV: RRR, no m/r/g, no jvd Resp: Clear to auscultation bilaterally GI: abdomen is soft, non-tender, non-distended, normal bowel sounds, no hepatosplenomegaly MSK: extremities are warm, 1+ bialtearl LE edema Skin: warm, no rash Neuro:  no focal deficits Psych: appropriate affect   Diagnostic Studies 12/2012 Echo LVEF 55%, mild LVH, grade II diastolic dysfunction,   9/79/89 EKG: sinus bradycardia rate 59  05/2016 LHC/RHC 1. Near-normal filling pressures.  2. No oxygen saturation step-up between RA and PA to suggest residual ASD.  3. Mild pulmonary hypertension with low PVR, suggests pulmonary venous hypertension.  4.  Nonobstructive CAD, most significant disease being serial 50% stenoses in the proximal to mid LCx.   Would recommend aspirin and statin treatment for CAD.   She is on an adequate dose of Lasix, would not change.   Hydration post-procedure given CKD.   03/2016 echo Study Conclusions  - Left ventricle: The cavity size was normal. There was moderate concentric hypertrophy. Systolic function was normal. The estimated ejection fraction was in the range of 55% to 60%. Wall motion was normal; there were no regional wall motion abnormalities. Features are consistent with a pseudonormal left ventricular filling pattern, with concomitant abnormal relaxation and increased filling pressure (grade 2 diastolic dysfunction). - Mitral valve: Calcified annulus. There was trivial regurgitation. - Left atrium: The atrium was moderately dilated. - Right ventricle: The cavity size was moderately dilated. Wall thickness was normal. Systolic function was mildly to moderately reduced. - Pulmonary arteries: PA peak pressure: 49 mm Hg (S).  Impressions:  - The right ventricular systolic pressure was increased consistent with moderate pulmonary hypertension.    Assessment and Plan  1. Chronic diastolic heart failure - some increase in symptoms. She will change lasix to 36m alternative with 449mdaily. Check BMET/Mg in 2 weeks.  - f/u 3 weeks  2. HTN - at goal, continue current meds  3. Pulmonary venous HTN - no symptoms, continue volume control  4. Hyperlipidemia -continue statin      JoArnoldo LenisM.D.

## 2017-01-25 NOTE — Patient Instructions (Signed)
Medication Instructions:  TAKE LASIX 40 MG  (1 TABLET) ALTERNATING WITH 60 MG (1 1/2 TABLETS)  DAILY   Labwork: 2 WEEKS BMET MAGNESIUM   Testing/Procedures: NONE  Follow-Up: Your physician recommends that you schedule a follow-up appointment in: 3 WEEKS    Any Other Special Instructions Will Be Listed Below (If Applicable).     If you need a refill on your cardiac medications before your next appointment, please call your pharmacy.

## 2017-01-30 DIAGNOSIS — N183 Chronic kidney disease, stage 3 (moderate): Secondary | ICD-10-CM | POA: Diagnosis not present

## 2017-01-30 DIAGNOSIS — M1991 Primary osteoarthritis, unspecified site: Secondary | ICD-10-CM | POA: Diagnosis not present

## 2017-01-30 DIAGNOSIS — Z87891 Personal history of nicotine dependence: Secondary | ICD-10-CM | POA: Diagnosis not present

## 2017-01-30 DIAGNOSIS — E1122 Type 2 diabetes mellitus with diabetic chronic kidney disease: Secondary | ICD-10-CM | POA: Diagnosis not present

## 2017-01-30 DIAGNOSIS — I4891 Unspecified atrial fibrillation: Secondary | ICD-10-CM | POA: Diagnosis not present

## 2017-01-30 DIAGNOSIS — I5033 Acute on chronic diastolic (congestive) heart failure: Secondary | ICD-10-CM | POA: Diagnosis not present

## 2017-01-30 DIAGNOSIS — J449 Chronic obstructive pulmonary disease, unspecified: Secondary | ICD-10-CM | POA: Diagnosis not present

## 2017-01-30 DIAGNOSIS — J9621 Acute and chronic respiratory failure with hypoxia: Secondary | ICD-10-CM | POA: Diagnosis not present

## 2017-01-30 DIAGNOSIS — I130 Hypertensive heart and chronic kidney disease with heart failure and stage 1 through stage 4 chronic kidney disease, or unspecified chronic kidney disease: Secondary | ICD-10-CM | POA: Diagnosis not present

## 2017-01-30 DIAGNOSIS — E785 Hyperlipidemia, unspecified: Secondary | ICD-10-CM | POA: Diagnosis not present

## 2017-01-31 ENCOUNTER — Encounter: Payer: Self-pay | Admitting: Cardiology

## 2017-02-01 DIAGNOSIS — E785 Hyperlipidemia, unspecified: Secondary | ICD-10-CM | POA: Diagnosis not present

## 2017-02-01 DIAGNOSIS — Z87891 Personal history of nicotine dependence: Secondary | ICD-10-CM | POA: Diagnosis not present

## 2017-02-01 DIAGNOSIS — N183 Chronic kidney disease, stage 3 (moderate): Secondary | ICD-10-CM | POA: Diagnosis not present

## 2017-02-01 DIAGNOSIS — I130 Hypertensive heart and chronic kidney disease with heart failure and stage 1 through stage 4 chronic kidney disease, or unspecified chronic kidney disease: Secondary | ICD-10-CM | POA: Diagnosis not present

## 2017-02-01 DIAGNOSIS — J449 Chronic obstructive pulmonary disease, unspecified: Secondary | ICD-10-CM | POA: Diagnosis not present

## 2017-02-01 DIAGNOSIS — I5033 Acute on chronic diastolic (congestive) heart failure: Secondary | ICD-10-CM | POA: Diagnosis not present

## 2017-02-01 DIAGNOSIS — I4891 Unspecified atrial fibrillation: Secondary | ICD-10-CM | POA: Diagnosis not present

## 2017-02-01 DIAGNOSIS — J9621 Acute and chronic respiratory failure with hypoxia: Secondary | ICD-10-CM | POA: Diagnosis not present

## 2017-02-01 DIAGNOSIS — E1122 Type 2 diabetes mellitus with diabetic chronic kidney disease: Secondary | ICD-10-CM | POA: Diagnosis not present

## 2017-02-01 DIAGNOSIS — M1991 Primary osteoarthritis, unspecified site: Secondary | ICD-10-CM | POA: Diagnosis not present

## 2017-02-06 DIAGNOSIS — M1991 Primary osteoarthritis, unspecified site: Secondary | ICD-10-CM | POA: Diagnosis not present

## 2017-02-06 DIAGNOSIS — J449 Chronic obstructive pulmonary disease, unspecified: Secondary | ICD-10-CM | POA: Diagnosis not present

## 2017-02-06 DIAGNOSIS — I4891 Unspecified atrial fibrillation: Secondary | ICD-10-CM | POA: Diagnosis not present

## 2017-02-06 DIAGNOSIS — N183 Chronic kidney disease, stage 3 (moderate): Secondary | ICD-10-CM | POA: Diagnosis not present

## 2017-02-06 DIAGNOSIS — E785 Hyperlipidemia, unspecified: Secondary | ICD-10-CM | POA: Diagnosis not present

## 2017-02-06 DIAGNOSIS — J9621 Acute and chronic respiratory failure with hypoxia: Secondary | ICD-10-CM | POA: Diagnosis not present

## 2017-02-06 DIAGNOSIS — I130 Hypertensive heart and chronic kidney disease with heart failure and stage 1 through stage 4 chronic kidney disease, or unspecified chronic kidney disease: Secondary | ICD-10-CM | POA: Diagnosis not present

## 2017-02-06 DIAGNOSIS — I5033 Acute on chronic diastolic (congestive) heart failure: Secondary | ICD-10-CM | POA: Diagnosis not present

## 2017-02-06 DIAGNOSIS — E1122 Type 2 diabetes mellitus with diabetic chronic kidney disease: Secondary | ICD-10-CM | POA: Diagnosis not present

## 2017-02-06 DIAGNOSIS — Z87891 Personal history of nicotine dependence: Secondary | ICD-10-CM | POA: Diagnosis not present

## 2017-02-07 ENCOUNTER — Other Ambulatory Visit: Payer: Self-pay | Admitting: Physician Assistant

## 2017-02-07 ENCOUNTER — Other Ambulatory Visit (HOSPITAL_COMMUNITY): Payer: Self-pay | Admitting: Cardiology

## 2017-02-07 DIAGNOSIS — R69 Illness, unspecified: Secondary | ICD-10-CM | POA: Diagnosis not present

## 2017-02-07 NOTE — Telephone Encounter (Signed)
Refill appropriate 

## 2017-02-09 ENCOUNTER — Other Ambulatory Visit: Payer: Self-pay | Admitting: Cardiology

## 2017-02-09 DIAGNOSIS — I10 Essential (primary) hypertension: Secondary | ICD-10-CM | POA: Diagnosis not present

## 2017-02-09 DIAGNOSIS — E559 Vitamin D deficiency, unspecified: Secondary | ICD-10-CM | POA: Diagnosis not present

## 2017-02-09 DIAGNOSIS — D509 Iron deficiency anemia, unspecified: Secondary | ICD-10-CM | POA: Diagnosis not present

## 2017-02-09 DIAGNOSIS — R809 Proteinuria, unspecified: Secondary | ICD-10-CM | POA: Diagnosis not present

## 2017-02-09 DIAGNOSIS — Z79899 Other long term (current) drug therapy: Secondary | ICD-10-CM | POA: Diagnosis not present

## 2017-02-09 DIAGNOSIS — N183 Chronic kidney disease, stage 3 (moderate): Secondary | ICD-10-CM | POA: Diagnosis not present

## 2017-02-10 ENCOUNTER — Telehealth: Payer: Self-pay | Admitting: Cardiology

## 2017-02-10 NOTE — Telephone Encounter (Signed)
I was notified by [facility] about a glucose level of 26 collected in clinic yesterday. I called the patient and she reports that she is feeling well and free of complaints.  She will be continue to be cautious with insulin administration.

## 2017-02-11 LAB — BASIC METABOLIC PANEL
BUN/Creatinine Ratio: 15 (ref 12–28)
BUN: 24 mg/dL (ref 8–27)
CALCIUM: 9.4 mg/dL (ref 8.7–10.3)
CHLORIDE: 100 mmol/L (ref 96–106)
CO2: 24 mmol/L (ref 20–29)
Creatinine, Ser: 1.58 mg/dL — ABNORMAL HIGH (ref 0.57–1.00)
GFR calc non Af Amer: 32 mL/min/{1.73_m2} — ABNORMAL LOW (ref >59)
GFR, EST AFRICAN AMERICAN: 36 mL/min/{1.73_m2} — AB (ref >59)
GLUCOSE: 25 mg/dL — AB (ref 65–99)
Potassium: 4.1 mmol/L (ref 3.5–5.2)
Sodium: 142 mmol/L (ref 134–144)

## 2017-02-11 LAB — SPECIMEN STATUS REPORT

## 2017-02-11 LAB — MAGNESIUM: MAGNESIUM: 2.3 mg/dL (ref 1.6–2.3)

## 2017-02-14 NOTE — Progress Notes (Signed)
Cardiology Office Note    Date:  02/15/2017   ID:  MELYNDA KRZYWICKI, DOB [birth year], MRN [id number]  PCP:  Orlena Sheldon, PA-C  Cardiologist: Dr. Harl Bowie   Chief Complaint  Patient presents with  . Follow-up    3 week visit    History of Present Illness:    CRYSTALE GIANNATTASIO is a 77 y.o. female with past medical history of chronic diastolic CHF, PAF (on Eliquis), history of ASD (s/p repair), HTN, HLD, Stage 3 CKD, and COPD who presents to the office today for 3-week follow-up.  She was recently admitted to [facility] in 12/2016 for acute on chronic diastolic CHF and found to have newly diagnosed atrial fibrillation with RVR. She was started on Eliquis for anticoagulation and rate-control was pursued at that time. She followed-up with Dr. Harl Bowie on 01/25/2017 and reported weights had been stable at 172 lbs on her home scales and she was having infrequent palpitations. Did have some lower extremity edema, therefore her Lasix was changed to '60mg'$  alternating with '40mg'$  daily.  Repeat labs were obtained on 02/09/2017 and showed her creatinine was stable at 1.58 with K+ 4.1. Her glucose was significantly low at < 25 and the patient was contacted about this and reported "feeling fine" at that time.   In talking with the patient today, she reports overall doing well since her last visit. She has baseline dyspnea on exertion and 3-pillow orthopnea at baseline which is unchanged. She notices the dyspnea most when walking around the grocery store or [place]. She denies any associated chest pain. Does experience infrequent palpitations and says they only last for seconds to minutes at a time. Reports significant improvement in her edema with the recent Lasix dose adjustment and says weight is down to 167 lbs on her home scales.   She does check her BP regularly at home and reports SBP has been in the 140's -150's. Unsure of her HR.   Reports good compliance with her medication regimen, including Eliquis, and  denies any evidence of active bleeding.    Past Medical History:  Diagnosis Date  . Arthritis    "hands, feet" (02/17/2016)  . CAD (coronary artery disease)    a. 05/2016: cath showing nonobstructive CAD with 50% prox-Cx stenosis  . CKD (chronic kidney disease) stage 3, GFR 30-59 ml/min (HCC) 12/25/2012  . COPD (chronic obstructive pulmonary disease) ([place])   . Diastolic heart failure ([place])   . Hyperlipidemia   . Hypertension   . Osteoporosis   . Pneumonia    "several times" (02/17/2016)  . Small bowel obstruction ([place]) 02/17/2016  . Tricuspid regurgitation    Echo 05/02/06-Nml LV. Mild MR. Mod TR  . Type II diabetes mellitus ([place])   . Vitamin D deficiency     Past Surgical History:  Procedure Laterality Date  . APPENDECTOMY    . HEART CHAMBER REVISION     patched hole --[facility]  . LAPAROSCOPIC APPENDECTOMY N/A 09/27/2013   Procedure: APPENDECTOMY - CONVERTED FROM LAPAROSCOPIC;  Surgeon: Donnie Mesa, MD;  Location: [place];  Service: General;  Laterality: N/A;  . RIGHT/LEFT HEART CATH AND CORONARY ANGIOGRAPHY N/A 05/24/2016   Procedure: Right/Left Heart Cath and Coronary Angiography;  Surgeon: Larey Dresser, MD;  Location: [place] [facility];  Service: Cardiovascular;  Laterality: N/A;  . TUBAL LIGATION      Current Medications: Outpatient Medications Prior to Visit  Medication Sig Dispense Refill  . acetaminophen (TYLENOL) 500 MG tablet Take  500 mg by mouth every 6 (six) hours as needed for pain.    Marland Kitchen amLODipine (NORVASC) 10 MG tablet TAKE ONE TABLET BY MOUTH ONCE DAILY (Patient taking differently: TAKE ONE TABLET ('10MG'$ ) BY MOUTH ONCE DAILY) 90 tablet 3  . apixaban (ELIQUIS) 5 MG TABS tablet Take 1 tablet (5 mg total) by mouth 2 (two) times daily. 60 tablet 1  . Blood Glucose Monitoring Suppl (ONE TOUCH ULTRA SYSTEM KIT) w/Device KIT 1 meter 1 each 0  . calcitRIOL (ROCALTROL) 0.25 MCG capsule Take 0.25 mcg by mouth every Monday, Wednesday, and Friday.     . Cholecalciferol  (VITAMIN D) 2000 UNITS CAPS Take 2,000 Units by mouth daily.     . cloNIDine (CATAPRES) 0.3 MG tablet TAKE 1 TABLET BY MOUTH TWICE DAILY 60 tablet 3  . furosemide (LASIX) 40 MG tablet Take 60 mg alternating with 40 mg daily. 40 tablet 3  . furosemide (LASIX) 40 MG tablet TAKE 1 TABLET BY MOUTH ONCE DAILY 90 tablet 0  . gabapentin (NEURONTIN) 100 MG capsule TAKE 1 TO 2 CAPSULES BY MOUTH AT BEDTIME FOR  PERIPHERAL  NEUROPATHY. (Patient taking differently: TAKE 1 CAPSULES (100 MG) BY MOUTH IN THE MORNING FOR  PERIPHERAL  NEUROPATHY.) 120 capsule 0  . glucose blood test strip Use as directed. Check blood sugar 3 times daily before meals and at bedtime. 100 each 12  . Lancets (ONETOUCH ULTRASOFT) lancets Use as instructed 100 each 12  . LEVEMIR FLEXTOUCH 100 UNIT/ML Pen INJECT 22 UNITS SUBCUTANEOUSLY ONCE DAILY AT 10 PM 15 mL 0  . linaclotide (LINZESS) 145 MCG CAPS capsule Take 1 capsule (145 mcg total) by mouth daily before breakfast. 30 capsule 5  . mirabegron ER (MYRBETRIQ) 50 MG TB24 tablet Take 1 tablet (50 mg total) by mouth daily. 30 tablet 5  . NOVOLOG FLEXPEN 100 UNIT/ML FlexPen INJECT 7 UNITS SUBCUTANEOUSLY WITH LUNCH AND SUPPER. INJECT 15 MINUTES PRIOR TO MEAL OR IMMEDIATELY AFTER MEAL. 5 pen 11  . omeprazole (PRILOSEC) 20 MG capsule TAKE ONE CAPSULE BY MOUTH ONCE DAILY (Patient taking differently: TAKE ONE CAPSULE (20 MG) BY MOUTH ONCE DAILY) 90 capsule 3  . polyethylene glycol (MIRALAX / GLYCOLAX) packet Take 17 g by mouth daily. (Patient taking differently: Take 17 g by mouth daily as needed for mild constipation. ) 14 each 0  . psyllium (HYDROCIL/METAMUCIL) 95 % PACK Take 1 packet by mouth 3 (three) times daily. 56 each 0  . RELION SHORT PEN NEEDLES 31G X 8 MM MISC USE ONE PEN NEEDLE ONCE DAILY AS DIRECTED 50 each 11  . senna (SENOKOT) 8.6 MG TABS tablet Take 1 tablet (8.6 mg total) by mouth 2 (two) times daily. 120 each 0  . simvastatin (ZOCOR) 20 MG tablet TAKE ONE TABLET BY MOUTH ONCE  DAILY AT BEDTIME (Patient taking differently: TAKE ONE TABLET ('20MG'$ ) BY MOUTH ONCE DAILY AT BEDTIME) 90 tablet 3  . carvedilol (COREG) 6.25 MG tablet TAKE 1 TABLET BY MOUTH TWICE DAILY (Patient taking differently: TAKE 1 TABLET (6.25 MG) BY MOUTH TWICE DAILY) 60 tablet 3   No facility-administered medications prior to visit.      Allergies:   Ace inhibitors; Actos [pioglitazone]; Aspirin; and Metformin and related   Social History   Socioeconomic History  . Marital status: Widowed    Spouse name: None  . Number of children: None  . Years of education: None  . Highest education level: None  Social Needs  . Financial resource strain: None  .  Food insecurity - worry: None  . Food insecurity - inability: None  . Transportation needs - medical: None  . Transportation needs - non-medical: None  Occupational History  . Occupation: retired  Tobacco Use  . Smoking status: Former Smoker    Packs/day: 0.50    Years: 50.00    Pack years: 25.00    Types: Cigarettes    Last attempt to quit: 01/19/2009    Years since quitting: 8.0  . Smokeless tobacco: Never Used  Substance and Sexual Activity  . Alcohol use: No    Alcohol/week: 0.0 oz  . Drug use: No  . Sexual activity: No  Other Topics Concern  . None  Social History Narrative   Entered 10/2013:   She has never driven.   She lives with her son.     Family History:  The patient's family history includes Cancer in her brother, father, and sister; Diabetes in her father.   Review of Systems:   Please see the history of present illness.     General:  No chills, fever, night sweats or weight changes.  Cardiovascular:  No chest pain, edema, palpitations, paroxysmal nocturnal dyspnea. Positive for dyspnea on exertion and orthopnea.  Dermatological: No rash, lesions/masses Respiratory: No cough, Positive for dyspnea Urologic: No hematuria, dysuria Abdominal:   No nausea, vomiting, diarrhea, bright red blood per rectum, melena, or  hematemesis Neurologic:  No visual changes, wkns, changes in mental status. All other systems reviewed and are otherwise negative except as noted above.   Physical Exam:    VS:  BP 138/66   Pulse 96   Ht '5\' 3"'$  (1.6 m)   Wt 170 lb (77.1 kg)   SpO2 95%   BMI 30.11 kg/m    General: Well developed, well nourished Caucasian female appearing in no acute distress. Head: Normocephalic, atraumatic, sclera non-icteric, no xanthomas, nares are without discharge.  Neck: No carotid bruits. JVD not elevated.  Lungs: Respirations regular and unlabored, without wheezes or rales.  Heart: Irregularly irregular. No S3 or S4.  No murmur, no rubs, or gallops appreciated. Abdomen: Soft, non-tender, non-distended with normoactive bowel sounds. No hepatomegaly. No rebound/guarding. Abdominal hernia present.  Msk:  Strength and tone appear normal for age. No joint deformities or effusions. Extremities: No clubbing or cyanosis. No lower extremity edema.  Distal pedal pulses are 2+ bilaterally. Neuro: Alert and oriented X 3. Moves all extremities spontaneously. No focal deficits noted. Psych:  Responds to questions appropriately with a normal affect. Skin: No rashes or lesions noted  Wt Readings from Last 3 Encounters:  02/15/17 170 lb (77.1 kg)  01/25/17 176 lb (79.8 kg)  01/24/17 176 lb (79.8 kg)     Studies/Labs Reviewed:   EKG:  EKG is not ordered today.    Recent Labs: 01/13/2017: ALT 14; B Natriuretic Peptide 450.0 01/14/2017: TSH 1.863 01/15/2017: Hemoglobin 10.3; Platelets 146 02/09/2017: BUN 24; Creatinine, Ser 1.58; Magnesium 2.3; Potassium 4.1; Sodium 142   Lipid Panel    Component Value Date/Time   CHOL 162 09/20/2016 1123   TRIG 208 (H) 09/20/2016 1123   HDL 64 09/20/2016 1123   CHOLHDL 2.5 09/20/2016 1123   VLDL 42 (H) 09/20/2016 1123   LDLCALC 56 09/20/2016 1123    Additional studies/ records that were reviewed today include:   Cardiac Catheterization: 05/2016 1.  Near-normal filling pressures.  2. No oxygen saturation step-up between RA and PA to suggest residual ASD.  3. Mild pulmonary hypertension with low PVR, suggests pulmonary venous  hypertension.  4. Nonobstructive CAD, most significant disease being serial 50% stenoses in the proximal to mid LCx.    Would recommend aspirin and statin treatment for CAD.   She is on an adequate dose of Lasix, would not change.   Hydration post-procedure given CKD.   Echocardiogram: 12/2016 Study Conclusions  - Left ventricle: The cavity size was normal. Wall thickness was   increased in a pattern of mild LVH. Systolic function was normal.   The estimated ejection fraction was in the range of 55% to 60%.   Wall motion was normal; there were no regional wall motion   abnormalities. - Mitral valve: Calcified annulus. There was mild regurgitation. - Left atrium: The atrium was moderately dilated. - Right ventricle: The cavity size was mildly dilated. Systolic   function was mildly reduced. - Right atrium: The atrium was mildly dilated. - Pulmonary arteries: Systolic pressure was mildly increased.  Impressions:  - Normal LV systolic function; mild LVH; mild MR; moderate LAE;   mild RAE and RVE; mild TR; mild pulmonary hypertension.  Assessment:    1. Chronic diastolic congestive heart failure ([place])   2. Dyspnea on exertion   3. PAF (paroxysmal atrial fibrillation) ([place])   4. Current use of long term anticoagulation   5. Essential hypertension   6. Mixed hyperlipidemia   7. CKD (chronic kidney disease) stage 3, GFR 30-59 ml/min (HCC)      Plan:   In order of problems listed above:  1. Chronic Diastolic CHF/ Dyspnea on Exertion - echo in 12/2016 showed a preserved EF of 55-60% with no regional WMA.  - she reports having dyspnea on exertion and orthopnea for several years. Was recently experiencing edema but this improved with dose adjustment of her Lasix and weight has declined by 6 lbs on  our office scales.  - will continue on Lasix '40mg'$  alternating with '60mg'$  daily as she appears euvolemic on examination today. Fluid and sodium restriction reviewed. She is mildly tachycardiac, therefore will increase Coreg to 12.'5mg'$  BID to better assist with rate-control which could be playing a factor in her dyspnea.   2. Paroxysmal Atrial Fibrillation/ Use of Long-Term Anticoagulation - the patient was diagnosed with PAF in 12/2016 and started on anticoagulation at that time with a rate-control strategy initially pursued as she has denied any persistent palpitations.  - HR is irregular on examination today and she is initially tachycardiac at 104, improved to the 90's during this encounter. Will further titrate Coreg to 12.'5mg'$  BID to assist with rate-control. I have asked her to continue to follow HR and BP at home.  - she denies any evidence of active bleeding. Will continue on Eliquis for anticoagulation.   3. HTN - BP elevated initially to 142/76, slightly improved to 138/66 on recheck. - continue Amlodipine, Clonidine, and Lasix at current dosing. Will titrate Coreg to 12.'5mg'$  BID in the setting of tachycardia as outlined above.   4. HLD - Lipid Panel in 08/2016 showed total cholesterol of 162, HDL 64, and LDL 56. At goal of LDL < 70 in the setting of IDDM and known coronary calcifications by cath.  - continue Simvastatin '20mg'$  daily.   5. Stage 3 CKD - creatinine stable at 1.58 on most recent check. Close to baseline.     Medication Adjustments/Labs and Tests Ordered: Current medicines are reviewed at length with the patient today.  Concerns regarding medicines are outlined above.  Medication changes, Labs and Tests ordered today are listed in the Patient  Instructions below. Patient Instructions  Your physician recommends that you schedule a follow-up appointment in: 6-8 weeks with Dr.Branch  INCREASE Coreg to 12.5 mg twice a day  If you need a refill on your cardiac medications  before your next appointment, please call your pharmacy.  All other medications stay the same  No test ordered today.  Thank you for choosing [facility] !         Signed, Erma Heritage, PA-C  02/15/2017 2:19 PM    [facility] [address] [place], [place] [address] Phone: [phone]

## 2017-02-15 ENCOUNTER — Ambulatory Visit: Payer: Medicare HMO | Admitting: Student

## 2017-02-15 ENCOUNTER — Encounter: Payer: Self-pay | Admitting: Student

## 2017-02-15 VITALS — BP 138/66 | HR 96 | Ht 63.0 in | Wt 170.0 lb

## 2017-02-15 DIAGNOSIS — E782 Mixed hyperlipidemia: Secondary | ICD-10-CM

## 2017-02-15 DIAGNOSIS — Z7901 Long term (current) use of anticoagulants: Secondary | ICD-10-CM | POA: Diagnosis not present

## 2017-02-15 DIAGNOSIS — R0609 Other forms of dyspnea: Secondary | ICD-10-CM

## 2017-02-15 DIAGNOSIS — I10 Essential (primary) hypertension: Secondary | ICD-10-CM | POA: Diagnosis not present

## 2017-02-15 DIAGNOSIS — R0600 Dyspnea, unspecified: Secondary | ICD-10-CM

## 2017-02-15 DIAGNOSIS — I480 Paroxysmal atrial fibrillation: Secondary | ICD-10-CM

## 2017-02-15 DIAGNOSIS — N183 Chronic kidney disease, stage 3 unspecified: Secondary | ICD-10-CM

## 2017-02-15 DIAGNOSIS — I5032 Chronic diastolic (congestive) heart failure: Secondary | ICD-10-CM | POA: Diagnosis not present

## 2017-02-15 MED ORDER — CARVEDILOL 12.5 MG PO TABS: 12.5000 mg | ORAL_TABLET | Freq: Two times a day (BID) | ORAL | 3 refills | Status: DC

## 2017-02-15 NOTE — Patient Instructions (Addendum)
Your physician recommends that you schedule a follow-up appointment in: 6-8 weeks with Dr.Branch     INCREASE Coreg to 12.5 mg twice a day   If you need a refill on your cardiac medications before your next appointment, please call your pharmacy.   All other medications stay the same   Keep daily blood pressure log, I gave you a sheet to help document.  No test ordered today.    Thank you for choosing [facility] !

## 2017-02-20 DIAGNOSIS — R809 Proteinuria, unspecified: Secondary | ICD-10-CM | POA: Diagnosis not present

## 2017-02-20 DIAGNOSIS — N184 Chronic kidney disease, stage 4 (severe): Secondary | ICD-10-CM | POA: Diagnosis not present

## 2017-02-20 DIAGNOSIS — I509 Heart failure, unspecified: Secondary | ICD-10-CM | POA: Diagnosis not present

## 2017-02-20 DIAGNOSIS — D649 Anemia, unspecified: Secondary | ICD-10-CM | POA: Diagnosis not present

## 2017-02-20 DIAGNOSIS — I10 Essential (primary) hypertension: Secondary | ICD-10-CM | POA: Diagnosis not present

## 2017-02-20 DIAGNOSIS — E1129 Type 2 diabetes mellitus with other diabetic kidney complication: Secondary | ICD-10-CM | POA: Diagnosis not present

## 2017-02-27 DIAGNOSIS — I130 Hypertensive heart and chronic kidney disease with heart failure and stage 1 through stage 4 chronic kidney disease, or unspecified chronic kidney disease: Secondary | ICD-10-CM | POA: Diagnosis not present

## 2017-02-27 DIAGNOSIS — E1122 Type 2 diabetes mellitus with diabetic chronic kidney disease: Secondary | ICD-10-CM | POA: Diagnosis not present

## 2017-02-27 DIAGNOSIS — I4891 Unspecified atrial fibrillation: Secondary | ICD-10-CM | POA: Diagnosis not present

## 2017-02-27 DIAGNOSIS — I509 Heart failure, unspecified: Secondary | ICD-10-CM | POA: Diagnosis not present

## 2017-02-27 DIAGNOSIS — E1142 Type 2 diabetes mellitus with diabetic polyneuropathy: Secondary | ICD-10-CM | POA: Diagnosis not present

## 2017-02-27 DIAGNOSIS — Z794 Long term (current) use of insulin: Secondary | ICD-10-CM | POA: Diagnosis not present

## 2017-02-27 DIAGNOSIS — J449 Chronic obstructive pulmonary disease, unspecified: Secondary | ICD-10-CM | POA: Diagnosis not present

## 2017-02-27 DIAGNOSIS — E785 Hyperlipidemia, unspecified: Secondary | ICD-10-CM | POA: Diagnosis not present

## 2017-02-27 DIAGNOSIS — K08409 Partial loss of teeth, unspecified cause, unspecified class: Secondary | ICD-10-CM | POA: Diagnosis not present

## 2017-02-27 DIAGNOSIS — K219 Gastro-esophageal reflux disease without esophagitis: Secondary | ICD-10-CM | POA: Diagnosis not present

## 2017-03-06 DIAGNOSIS — R69 Illness, unspecified: Secondary | ICD-10-CM | POA: Diagnosis not present

## 2017-03-18 ENCOUNTER — Other Ambulatory Visit: Payer: Self-pay | Admitting: Physician Assistant

## 2017-03-19 NOTE — Telephone Encounter (Signed)
Refill appropriate 

## 2017-03-27 ENCOUNTER — Encounter: Payer: Self-pay | Admitting: Physician Assistant

## 2017-04-05 ENCOUNTER — Ambulatory Visit: Payer: Medicare HMO | Admitting: Cardiology

## 2017-04-06 DIAGNOSIS — R69 Illness, unspecified: Secondary | ICD-10-CM | POA: Diagnosis not present

## 2017-04-11 ENCOUNTER — Other Ambulatory Visit: Payer: Self-pay | Admitting: Physician Assistant

## 2017-04-11 DIAGNOSIS — E1142 Type 2 diabetes mellitus with diabetic polyneuropathy: Secondary | ICD-10-CM

## 2017-04-12 ENCOUNTER — Other Ambulatory Visit: Payer: Self-pay

## 2017-04-12 MED ORDER — APIXABAN 5 MG PO TABS: 5.0000 mg | ORAL_TABLET | Freq: Two times a day (BID) | ORAL | 1 refills | Status: DC

## 2017-04-12 NOTE — Telephone Encounter (Signed)
Refill appropriate 

## 2017-04-13 ENCOUNTER — Encounter: Payer: Self-pay | Admitting: Cardiology

## 2017-04-13 ENCOUNTER — Ambulatory Visit: Payer: Medicare HMO | Admitting: Cardiology

## 2017-04-13 ENCOUNTER — Other Ambulatory Visit: Payer: Self-pay

## 2017-04-13 VITALS — BP 144/68 | HR 81 | Ht 63.0 in | Wt 172.8 lb

## 2017-04-13 DIAGNOSIS — I10 Essential (primary) hypertension: Secondary | ICD-10-CM

## 2017-04-13 DIAGNOSIS — I272 Pulmonary hypertension, unspecified: Secondary | ICD-10-CM

## 2017-04-13 DIAGNOSIS — I5032 Chronic diastolic (congestive) heart failure: Secondary | ICD-10-CM | POA: Diagnosis not present

## 2017-04-13 DIAGNOSIS — E782 Mixed hyperlipidemia: Secondary | ICD-10-CM | POA: Diagnosis not present

## 2017-04-13 MED ORDER — APIXABAN 5 MG PO TABS: 5.0000 mg | ORAL_TABLET | Freq: Two times a day (BID) | ORAL | 3 refills | Status: DC

## 2017-04-13 NOTE — Telephone Encounter (Signed)
Refilled eliquis 5 mg per faxrequest

## 2017-04-13 NOTE — Progress Notes (Signed)
Clinical Summary Laura Best is a 77 y.o.female seen today for follow up of the following medical problems.  1. Chronic diastolic heart failure - recent admit 12/2016 with acute on chronic diastolic HF. Diuresed, discharge weigh 171 lbs - home weights stable around 172 lbs. Has had some LE edema since discharge. Compliant with meds. Has noted some increase of SOB since discharge.    - issues with fluid overload Jan 2019. Lasix changed to '60mg'$  alternating with '40mg'$  with improved symptoms - home weights stable 169-170 lbs. No recent LE edema. Limiting sodium intake.  - compliant with meds. Chronic SOB/DOE.   2. Afib - new diagnosis during 12/2016 admission.  - started on eliquis  - no recent palpitaitons  3. HTN - Cr 2.07 BUN 77 GFR 23, from notes there is a question of possible right renal artery stenosis.   - home bp's 120-130s/60s - compliant with meds  - home bp's 120-130s/60-90s p 55-85  3. Bradycardia - heart rates mid 50sat home. Denies any lightheadedness or dizziness.  4. Pulmonary venous HTN - RHC as reported below.  - she denies any recent symptoms.   5. History of ASD repair - no evidence of shunt by prior cath  6. CKDIV - followed by nephrology   7. Hyperlipidemia - 08/2016: TC 162 TG 208 HDL 64 LDL 56 - she is compliant with meds   Past Medical History:  Diagnosis Date  . Arthritis    "hands, feet" (02/17/2016)  . CAD (coronary artery disease)    a. 05/2016: cath showing nonobstructive CAD with 50% prox-Cx stenosis  . CKD (chronic kidney disease) stage 3, GFR 30-59 ml/min (HCC) 12/25/2012  . COPD (chronic obstructive pulmonary disease) ([place])   . Diastolic heart failure ([place])   . Hyperlipidemia   . Hypertension   . Osteoporosis   . Pneumonia    "several times" (02/17/2016)  . Small bowel obstruction ([place]) 02/17/2016  . Tricuspid regurgitation    Echo 05/02/06-Nml LV. Mild MR. Mod TR  . Type II diabetes mellitus ([place])   . Vitamin D  deficiency      Allergies  Allergen Reactions  . Ace Inhibitors Other (See Comments)    Hyperkalemia  . Actos [Pioglitazone] Swelling  . Aspirin Other (See Comments)    G.I. Upset. In high doses  . Metformin And Related Diarrhea     Current Outpatient Medications  Medication Sig Dispense Refill  . acetaminophen (TYLENOL) 500 MG tablet Take 500 mg by mouth every 6 (six) hours as needed for pain.    Marland Kitchen amLODipine (NORVASC) 10 MG tablet TAKE ONE TABLET BY MOUTH ONCE DAILY (Patient taking differently: TAKE ONE TABLET ('10MG'$ ) BY MOUTH ONCE DAILY) 90 tablet 3  . apixaban (ELIQUIS) 5 MG TABS tablet Take 1 tablet (5 mg total) by mouth 2 (two) times daily. 60 tablet 1  . Blood Glucose Monitoring Suppl (ONE TOUCH ULTRA SYSTEM KIT) w/Device KIT 1 meter 1 each 0  . calcitRIOL (ROCALTROL) 0.25 MCG capsule Take 0.25 mcg by mouth every Monday, Wednesday, and Friday.     . carvedilol (COREG) 12.5 MG tablet Take 1 tablet (12.5 mg total) by mouth 2 (two) times daily. 180 tablet 3  . Cholecalciferol (VITAMIN D) 2000 UNITS CAPS Take 2,000 Units by mouth daily.     . cloNIDine (CATAPRES) 0.3 MG tablet TAKE 1 TABLET BY MOUTH TWICE DAILY 60 tablet 3  . furosemide (LASIX) 40 MG tablet Take 60 mg alternating with 40 mg daily. [place]  tablet 3  . furosemide (LASIX) 40 MG tablet TAKE 1 TABLET BY MOUTH ONCE DAILY 90 tablet 0  . gabapentin (NEURONTIN) 100 MG capsule TAKE 1 TO 2 CAPSULES BY MOUTH AT BEDTIME FOR PERIPHERAL NEUROPATHY. 120 capsule 0  . glucose blood test strip Use as directed. Check blood sugar 3 times daily before meals and at bedtime. 100 each 12  . Lancets (ONETOUCH ULTRASOFT) lancets Use as instructed 100 each 12  . LEVEMIR FLEXTOUCH 100 UNIT/ML Pen INJECT 22 UNITS SUBCUTANEOUSLY ONCE DAILY AT 10:00 PM 15 mL 0  . linaclotide (LINZESS) 145 MCG CAPS capsule Take 1 capsule (145 mcg total) by mouth daily before breakfast. 30 capsule 5  . mirabegron ER (MYRBETRIQ) 50 MG TB24 tablet Take 1 tablet (50 mg  total) by mouth daily. 30 tablet 5  . NOVOLOG FLEXPEN 100 UNIT/ML FlexPen INJECT 7 UNITS SUBCUTANEOUSLY WITH LUNCH AND SUPPER. INJECT 15 MINUTES PRIOR TO MEAL OR IMMEDIATELY AFTER MEAL. 5 pen 11  . omeprazole (PRILOSEC) 20 MG capsule TAKE ONE CAPSULE BY MOUTH ONCE DAILY (Patient taking differently: TAKE ONE CAPSULE (20 MG) BY MOUTH ONCE DAILY) 90 capsule 3  . polyethylene glycol (MIRALAX / GLYCOLAX) packet Take 17 g by mouth daily. (Patient taking differently: Take 17 g by mouth daily as needed for mild constipation. ) 14 each 0  . psyllium (HYDROCIL/METAMUCIL) 95 % PACK Take 1 packet by mouth 3 (three) times daily. 56 each 0  . RELION SHORT PEN NEEDLES 31G X 8 MM MISC USE ONE PEN NEEDLE ONCE DAILY AS DIRECTED 50 each 11  . senna (SENOKOT) 8.6 MG TABS tablet Take 1 tablet (8.6 mg total) by mouth 2 (two) times daily. 120 each 0  . simvastatin (ZOCOR) 20 MG tablet TAKE ONE TABLET BY MOUTH ONCE DAILY AT BEDTIME (Patient taking differently: TAKE ONE TABLET ('20MG'$ ) BY MOUTH ONCE DAILY AT BEDTIME) 90 tablet 3   No current facility-administered medications for this visit.      Past Surgical History:  Procedure Laterality Date  . APPENDECTOMY    . HEART CHAMBER REVISION     patched hole --[place]  . LAPAROSCOPIC APPENDECTOMY N/A 09/27/2013   Procedure: APPENDECTOMY - CONVERTED FROM LAPAROSCOPIC;  Surgeon: Donnie Mesa, MD;  Location: [facility];  Service: General;  Laterality: N/A;  . RIGHT/LEFT HEART CATH AND CORONARY ANGIOGRAPHY N/A 05/24/2016   Procedure: Right/Left Heart Cath and Coronary Angiography;  Surgeon: Larey Dresser, MD;  Location: [facility];  Service: Cardiovascular;  Laterality: N/A;  . TUBAL LIGATION       Allergies  Allergen Reactions  . Ace Inhibitors Other (See Comments)    Hyperkalemia  . Actos [Pioglitazone] Swelling  . Aspirin Other (See Comments)    G.I. Upset. In high doses  . Metformin And Related Diarrhea      Family History  Problem Relation Age of  Onset  . Cancer Father   . Diabetes Father   . Cancer Brother        Brain  . Cancer Sister        abdominal fat     Social History Laura Best reports that she quit smoking about 8 years ago. Her smoking use included cigarettes. She has a 25.00 pack-year smoking history. She has never used smokeless tobacco. Laura Best reports that she does not drink alcohol.   Review of Systems CONSTITUTIONAL: No weight loss, fever, chills, weakness or fatigue.  HEENT: Eyes: No visual loss, blurred vision, double vision or yellow sclerae.No  hearing loss, sneezing, congestion, runny nose or sore throat.  SKIN: No rash or itching.  CARDIOVASCULAR: per hpi RESPIRATORY: No shortness of breath, cough or sputum.  GASTROINTESTINAL: No anorexia, nausea, vomiting or diarrhea. No abdominal pain or blood.  GENITOURINARY: No burning on urination, no polyuria NEUROLOGICAL: No headache, dizziness, syncope, paralysis, ataxia, numbness or tingling in the extremities. No change in bowel or bladder control.  MUSCULOSKELETAL: No muscle, back pain, joint pain or stiffness.  LYMPHATICS: No enlarged nodes. No history of splenectomy.  PSYCHIATRIC: No history of depression or anxiety.  ENDOCRINOLOGIC: No reports of sweating, cold or heat intolerance. No polyuria or polydipsia.  Marland Kitchen   Physical Examination Vitals:   04/13/17 1125  BP: (!) 144/68  Pulse: 81  SpO2: 93%   Vitals:   04/13/17 1125  Weight: 172 lb 12.8 oz (78.4 kg)  Height: '5\' 3"'$  (1.6 m)    Gen: resting comfortably, no acute distress HEENT: no scleral icterus, pupils equal round and reactive, no palptable cervical adenopathy,  CV: RRR, no m/r/g, no jvd Resp: Clear to auscultation bilaterally GI: abdomen is soft, non-tender, non-distended, normal bowel sounds, no hepatosplenomegaly MSK: extremities are warm, no edema.  Skin: warm, no rash Neuro:  no focal deficits Psych: appropriate affect   Diagnostic Studies 12/2012 Echo LVEF 55%, mild LVH,  grade II diastolic dysfunction,   3/61/44 EKG: sinus bradycardia rate 59  05/2016 LHC/RHC 1. Near-normal filling pressures.  2. No oxygen saturation step-up between RA and PA to suggest residual ASD.  3. Mild pulmonary hypertension with low PVR, suggests pulmonary venous hypertension.  4. Nonobstructive CAD, most significant disease being serial 50% stenoses in the proximal to mid LCx.   Would recommend aspirin and statin treatment for CAD.   She is on an adequate dose of Lasix, would not change.   Hydration post-procedure given CKD.  03/2016 echo Study Conclusions  - Left ventricle: The cavity size was normal. There was moderate concentric hypertrophy. Systolic function was normal. The estimated ejection fraction was in the range of 55% to 60%. Wall motion was normal; there were no regional wall motion abnormalities. Features are consistent with a pseudonormal left ventricular filling pattern, with concomitant abnormal relaxation and increased filling pressure (grade 2 diastolic dysfunction). - Mitral valve: Calcified annulus. There was trivial regurgitation. - Left atrium: The atrium was moderately dilated. - Right ventricle: The cavity size was moderately dilated. Wall thickness was normal. Systolic function was mildly to moderately reduced. - Pulmonary arteries: PA peak pressure: 49 mm Hg (S).  Impressions:  - The right ventricular systolic pressure was increased consistent with moderate pulmonary hypertension.      Assessment and Plan  1. Chronic diastolic heart failure - appears euvolemic, continue current meds  2. HTN - mildly elevated in clinic, home bp's at goal - continue current meds  3. Pulmonary venous HTN - no symptoms, continue diuretics   4. Hyperlipidemia - she will continue statin, she is at goal   F/u 6 months   Arnoldo Lenis, M.D.

## 2017-04-13 NOTE — Patient Instructions (Signed)
Medication Instructions:  Your physician recommends that you continue on your current medications as directed. Please refer to the Current Medication list given to you today.   Labwork: NONE   Testing/Procedures: NONE  Follow-Up: Your physician wants you to follow-up in: 6 Months with Dr. Branch. You will receive a reminder letter in the mail two months in advance. If you don't receive a letter, please call our office to schedule the follow-up appointment.   Any Other Special Instructions Will Be Listed Below (If Applicable).     If you need a refill on your cardiac medications before your next appointment, please call your pharmacy. Thank you for choosing [facility]!    

## 2017-04-16 ENCOUNTER — Encounter: Payer: Self-pay | Admitting: Cardiology

## 2017-04-24 ENCOUNTER — Encounter: Payer: Self-pay | Admitting: Physician Assistant

## 2017-05-09 ENCOUNTER — Other Ambulatory Visit: Payer: Self-pay

## 2017-05-09 ENCOUNTER — Telehealth: Payer: Self-pay

## 2017-05-09 ENCOUNTER — Emergency Department (HOSPITAL_COMMUNITY)
Admission: EM | Admit: 2017-05-09 | Discharge: 2017-05-09 | Disposition: A | Discharge status: Home / Self Care | Payer: Medicare HMO | Attending: Emergency Medicine | Admitting: Emergency Medicine

## 2017-05-09 ENCOUNTER — Emergency Department (HOSPITAL_COMMUNITY): Payer: Medicare HMO

## 2017-05-09 ENCOUNTER — Encounter (HOSPITAL_COMMUNITY): Payer: Self-pay

## 2017-05-09 DIAGNOSIS — T7840XA Allergy, unspecified, initial encounter: Secondary | ICD-10-CM

## 2017-05-09 DIAGNOSIS — Z87891 Personal history of nicotine dependence: Secondary | ICD-10-CM | POA: Diagnosis not present

## 2017-05-09 DIAGNOSIS — J449 Chronic obstructive pulmonary disease, unspecified: Secondary | ICD-10-CM | POA: Diagnosis not present

## 2017-05-09 DIAGNOSIS — R220 Localized swelling, mass and lump, head: Secondary | ICD-10-CM | POA: Diagnosis not present

## 2017-05-09 DIAGNOSIS — L299 Pruritus, unspecified: Secondary | ICD-10-CM | POA: Diagnosis not present

## 2017-05-09 DIAGNOSIS — Z794 Long term (current) use of insulin: Secondary | ICD-10-CM | POA: Insufficient documentation

## 2017-05-09 DIAGNOSIS — R21 Rash and other nonspecific skin eruption: Secondary | ICD-10-CM | POA: Diagnosis not present

## 2017-05-09 DIAGNOSIS — I130 Hypertensive heart and chronic kidney disease with heart failure and stage 1 through stage 4 chronic kidney disease, or unspecified chronic kidney disease: Secondary | ICD-10-CM | POA: Insufficient documentation

## 2017-05-09 DIAGNOSIS — E1122 Type 2 diabetes mellitus with diabetic chronic kidney disease: Secondary | ICD-10-CM | POA: Insufficient documentation

## 2017-05-09 DIAGNOSIS — Z79899 Other long term (current) drug therapy: Secondary | ICD-10-CM | POA: Diagnosis not present

## 2017-05-09 DIAGNOSIS — E1140 Type 2 diabetes mellitus with diabetic neuropathy, unspecified: Secondary | ICD-10-CM | POA: Diagnosis not present

## 2017-05-09 DIAGNOSIS — N183 Chronic kidney disease, stage 3 (moderate): Secondary | ICD-10-CM | POA: Insufficient documentation

## 2017-05-09 DIAGNOSIS — R079 Chest pain, unspecified: Secondary | ICD-10-CM | POA: Diagnosis not present

## 2017-05-09 DIAGNOSIS — I5032 Chronic diastolic (congestive) heart failure: Secondary | ICD-10-CM | POA: Insufficient documentation

## 2017-05-09 DIAGNOSIS — Z7901 Long term (current) use of anticoagulants: Secondary | ICD-10-CM | POA: Insufficient documentation

## 2017-05-09 LAB — CBC WITH DIFFERENTIAL/PLATELET
BASOS ABS: 0 10*3/uL (ref 0.0–0.1)
Basophils Relative: 1 %
EOS PCT: 3 %
Eosinophils Absolute: 0.2 10*3/uL (ref 0.0–0.7)
HCT: 39.5 % (ref 36.0–46.0)
Hemoglobin: 12.7 g/dL (ref 12.0–15.0)
LYMPHS PCT: 23 %
Lymphs Abs: 1.3 10*3/uL (ref 0.7–4.0)
MCH: 27.9 pg (ref 26.0–34.0)
MCHC: 32.2 g/dL (ref 30.0–36.0)
MCV: 86.6 fL (ref 78.0–100.0)
MONO ABS: 0.5 10*3/uL (ref 0.1–1.0)
Monocytes Relative: 9 %
Neutro Abs: 3.8 10*3/uL (ref 1.7–7.7)
Neutrophils Relative %: 64 %
PLATELETS: 186 10*3/uL (ref 150–400)
RBC: 4.56 MIL/uL (ref 3.87–5.11)
RDW: 14.6 % (ref 11.5–15.5)
WBC: 5.9 10*3/uL (ref 4.0–10.5)

## 2017-05-09 LAB — I-STAT CHEM 8, ED
BUN: 23 mg/dL — ABNORMAL HIGH (ref 6–20)
CREATININE: 1.6 mg/dL — AB (ref 0.44–1.00)
Calcium, Ion: 1.12 mmol/L — ABNORMAL LOW (ref 1.15–1.40)
Chloride: 103 mmol/L (ref 101–111)
GLUCOSE: 234 mg/dL — AB (ref 65–99)
HCT: 40 % (ref 36.0–46.0)
HEMOGLOBIN: 13.6 g/dL (ref 12.0–15.0)
Potassium: 3.7 mmol/L (ref 3.5–5.1)
Sodium: 140 mmol/L (ref 135–145)
TCO2: 25 mmol/L (ref 22–32)

## 2017-05-09 LAB — BASIC METABOLIC PANEL
ANION GAP: 12 (ref 5–15)
BUN: 24 mg/dL — ABNORMAL HIGH (ref 6–20)
CALCIUM: 9 mg/dL (ref 8.9–10.3)
CO2: 24 mmol/L (ref 22–32)
Chloride: 101 mmol/L (ref 101–111)
Creatinine, Ser: 1.61 mg/dL — ABNORMAL HIGH (ref 0.44–1.00)
GFR calc Af Amer: 35 mL/min — ABNORMAL LOW (ref >60)
GFR, EST NON AFRICAN AMERICAN: 30 mL/min — AB (ref >60)
GLUCOSE: 240 mg/dL — AB (ref 65–99)
Potassium: 3.7 mmol/L (ref 3.5–5.1)
Sodium: 137 mmol/L (ref 135–145)

## 2017-05-09 LAB — TROPONIN I: Troponin I: <0.03 ng/mL (ref <0.03)

## 2017-05-09 MED ORDER — FAMOTIDINE IN NACL 20-0.9 MG/50ML-% IV SOLN
20.0000 mg | Freq: Once | INTRAVENOUS | Status: AC
  Administered 2017-05-09: 20 mg via INTRAVENOUS
  Filled 2017-05-09: qty 50

## 2017-05-09 MED ORDER — METHYLPREDNISOLONE SODIUM SUCC 125 MG IJ SOLR
125.0000 mg | Freq: Once | INTRAMUSCULAR | Status: AC
  Administered 2017-05-09: 125 mg via INTRAVENOUS
  Filled 2017-05-09: qty 2

## 2017-05-09 MED ORDER — DIPHENHYDRAMINE HCL 50 MG/ML IJ SOLN
25.0000 mg | Freq: Once | INTRAMUSCULAR | Status: AC
  Administered 2017-05-09: 25 mg via INTRAVENOUS
  Filled 2017-05-09: qty 1

## 2017-05-09 NOTE — Telephone Encounter (Signed)
Agree with er eval   Zandra Abts MD

## 2017-05-09 NOTE — Telephone Encounter (Signed)
Patient notes after starting Eliquis  , she is SOB, has had 2 black colored stools and he lips feel like they are on fire/I advised her to go to the ED for evaluation.She has agreed.I will FYI Dr Harl Bowie

## 2017-05-09 NOTE — Discharge Instructions (Addendum)
Take Benadryl for any rash or itching.  Increase your Prilosec to take it twice a day.  Stop taking your blood thinner called Eliquis.  Follow-up with your doctor the beginning of next week.  Call their office today or tomorrow to get seen early next week.  If you become worse come back to the hospital

## 2017-05-09 NOTE — ED Provider Notes (Signed)
[facility] EMERGENCY DEPARTMENT Provider Note   CSN: [id number] Arrival date & time: 05/09/17  0920     History   Chief Complaint Chief Complaint  Patient presents with  . Allergic Reaction    HPI Laura Best is a 77 y.o. female.  Patient complains of itching and rash and swelling to face.  She also states she has been having some black stools.  The history is provided by the patient.  Allergic Reaction  Presenting symptoms: itching and rash   Severity:  Mild Prior allergic episodes:  No prior episodes Context: not animal exposure   Relieved by:  Nothing Worsened by:  Nothing Ineffective treatments:  None tried   Past Medical History:  Diagnosis Date  . Arthritis    "hands, feet" (02/17/2016)  . CAD (coronary artery disease)    a. 05/2016: cath showing nonobstructive CAD with 50% prox-Cx stenosis  . CKD (chronic kidney disease) stage 3, GFR 30-59 ml/min (HCC) 12/25/2012  . COPD (chronic obstructive pulmonary disease) ([place])   . Diastolic heart failure ([place])   . Hyperlipidemia   . Hypertension   . Osteoporosis   . Pneumonia    "several times" (02/17/2016)  . Small bowel obstruction ([place]) 02/17/2016  . Tricuspid regurgitation    Echo 05/02/06-Nml LV. Mild MR. Mod TR  . Type II diabetes mellitus ([place])   . Vitamin D deficiency     Patient Active Problem List   Diagnosis Date Noted  . Persistent atrial fibrillation ([place])   . Acute on chronic respiratory failure with hypoxia ([place]) 01/13/2017  . Diabetic peripheral neuropathy associated with type 2 diabetes mellitus ([place]) 05/11/2016  . PAH (pulmonary artery hypertension) ([place])   . History of atrial septal defect repair   . Leukocytosis 04/06/2016  . Diastolic CHF, acute on chronic (HCC) 04/05/2016  . SBO (small bowel obstruction) ([place]) 02/17/2016  . Chronic diastolic congestive heart failure ([place]) 02/17/2016  . Diabetes mellitus with complication ([place]) 12/87/8676  . Pyuria 02/17/2016  . GERD (gastroesophageal  reflux disease) 04/05/2015  . Acute diverticulitis 05/03/2014  . Acute appendicitis with peritoneal abscess 09/27/2013  . Bradycardia 04/02/2013  . Hyperkalemia 01/22/2013  . Anemia of chronic disease 01/21/2013  . Acute bronchitis 01/20/2013  . CKD (chronic kidney disease) stage 3, GFR 30-59 ml/min (HCC) 12/25/2012  . Screening for colorectal cancer 12/25/2012  . Breast cancer screening 12/25/2012  . Vitamin D deficiency   . Osteoporosis   . Postop check 08/13/2012  . Appendiceal abscess 07/30/2012  . Fever, unspecified 07/19/2012  . Abdominal pain 07/19/2012  . SOB (shortness of breath) 07/19/2012  . ARF (acute renal failure) ([place]) 07/19/2012  . Abscess, appendix 07/19/2012  . Type 2 diabetes mellitus with renal manifestations ([place]) 02/02/2011  . Pulmonary hypertension ([place]) 02/02/2011  . COPD exacerbation ([place]) 02/02/2011  . Hypokalemia 02/02/2011  . Normocytic anemia 02/02/2011  . Essential hypertension   . Hyperlipidemia     Past Surgical History:  Procedure Laterality Date  . APPENDECTOMY    . HEART CHAMBER REVISION     patched hole --[place]  . LAPAROSCOPIC APPENDECTOMY N/A 09/27/2013   Procedure: APPENDECTOMY - CONVERTED FROM LAPAROSCOPIC;  Surgeon: Donnie Mesa, MD;  Location: [facility];  Service: General;  Laterality: N/A;  . RIGHT/LEFT HEART CATH AND CORONARY ANGIOGRAPHY N/A 05/24/2016   Procedure: Right/Left Heart Cath and Coronary Angiography;  Surgeon: Larey Dresser, MD;  Location: [facility];  Service: Cardiovascular;  Laterality: N/A;  . TUBAL LIGATION  OB History   None      Home Medications    Prior to Admission medications   Medication Sig Start Date End Date Taking? Authorizing Provider  acetaminophen (TYLENOL) 500 MG tablet Take 500 mg by mouth every 6 (six) hours as needed for pain.   Yes [provider]  amLODipine (NORVASC) 10 MG tablet TAKE ONE TABLET BY MOUTH ONCE DAILY Patient taking differently: TAKE ONE TABLET  ('10MG'$ ) BY MOUTH ONCE DAILY 07/12/16  Yes Lendon Colonel, NP  apixaban (ELIQUIS) 5 MG TABS tablet Take 1 tablet (5 mg total) by mouth 2 (two) times daily. 04/13/17  Yes Arnoldo Lenis, MD  calcitRIOL (ROCALTROL) 0.25 MCG capsule Take 0.25 mcg by mouth every Monday, Wednesday, and Friday.  04/19/16  Yes [provider]  carvedilol (COREG) 12.5 MG tablet Take 1 tablet (12.5 mg total) by mouth 2 (two) times daily. 02/15/17 05/16/17 Yes Strader, Fransisco Hertz, PA-C  Cholecalciferol (VITAMIN D) 2000 UNITS CAPS Take 2,000 Units by mouth daily.    Yes [provider]  cloNIDine (CATAPRES) 0.3 MG tablet TAKE 1 TABLET BY MOUTH TWICE DAILY 02/08/17  Yes Larey Dresser, MD  furosemide (LASIX) 40 MG tablet Take 60 mg alternating with 40 mg daily. 01/25/17  Yes Arnoldo Lenis, MD  furosemide (LASIX) 40 MG tablet TAKE 1 TABLET BY MOUTH ONCE DAILY 02/07/17  Yes Dena Billet B, PA-C  gabapentin (NEURONTIN) 100 MG capsule TAKE 1 TO 2 CAPSULES BY MOUTH AT BEDTIME FOR PERIPHERAL NEUROPATHY. 04/11/17  Yes Orlena Sheldon, PA-C  LEVEMIR FLEXTOUCH 100 UNIT/ML Pen INJECT 22 UNITS SUBCUTANEOUSLY ONCE DAILY AT 10:00 PM 03/19/17  Yes Orlena Sheldon, PA-C  linaclotide [facility]) 145 MCG CAPS capsule Take 1 capsule (145 mcg total) by mouth daily before breakfast. 01/24/17  Yes Dena Billet B, PA-C  mirabegron ER (MYRBETRIQ) 50 MG TB24 tablet Take 1 tablet (50 mg total) by mouth daily. 01/24/17  Yes Dixon, Mary B, PA-C  NOVOLOG FLEXPEN 100 UNIT/ML FlexPen INJECT 7 UNITS SUBCUTANEOUSLY WITH LUNCH AND SUPPER. INJECT 15 MINUTES PRIOR TO MEAL OR IMMEDIATELY AFTER MEAL. 02/07/17  Yes Dena Billet B, PA-C  omeprazole (PRILOSEC) 20 MG capsule TAKE ONE CAPSULE BY MOUTH ONCE DAILY Patient taking differently: TAKE ONE CAPSULE (20 MG) BY MOUTH ONCE DAILY 09/06/16  Yes Dena Billet B, PA-C  polyethylene glycol (MIRALAX / GLYCOLAX) packet Take 17 g by mouth daily. Patient taking differently: Take 17 g by mouth daily as needed for mild  constipation.  02/20/16  Yes Lavina Hamman, MD  psyllium (HYDROCIL/METAMUCIL) 95 % PACK Take 1 packet by mouth 3 (three) times daily. 02/19/16  Yes Lavina Hamman, MD  senna (SENOKOT) 8.6 MG TABS tablet Take 1 tablet (8.6 mg total) by mouth 2 (two) times daily. 01/16/17  Yes Bonnielee Haff, MD  simvastatin (ZOCOR) 20 MG tablet TAKE ONE TABLET BY MOUTH ONCE DAILY AT BEDTIME Patient taking differently: TAKE ONE TABLET ('20MG'$ ) BY MOUTH ONCE DAILY AT BEDTIME 06/08/16  Yes Dena Billet B, PA-C  Blood Glucose Monitoring Suppl (ONE TOUCH ULTRA SYSTEM KIT) w/Device KIT 1 meter 02/11/16   Dixon, Mary B, PA-C  glucose blood test strip Use as directed. Check blood sugar 3 times daily before meals and at bedtime. 11/14/16   Dena Billet B, PA-C  Lancets [facility] ULTRASOFT) lancets Use as instructed 02/11/16   Dena Billet B, PA-C  RELION SHORT PEN NEEDLES 31G X 8 MM MISC USE ONE PEN NEEDLE ONCE DAILY AS DIRECTED 03/10/14  Orlena Sheldon, PA-C    Family History Family History  Problem Relation Age of Onset  . Cancer Father   . Diabetes Father   . Cancer Brother        Brain  . Cancer Sister        abdominal fat    Social History Social History   Tobacco Use  . Smoking status: Former Smoker    Packs/day: 0.50    Years: 50.00    Pack years: 25.00    Types: Cigarettes    Last attempt to quit: 01/19/2009    Years since quitting: 8.3  . Smokeless tobacco: Never Used  Substance Use Topics  . Alcohol use: No    Alcohol/week: 0.0 oz  . Drug use: No     Allergies   Ace inhibitors; Actos [pioglitazone]; Aspirin; Eliquis [apixaban]; and Metformin and related   Review of Systems Review of Systems  Constitutional: Negative for appetite change and fatigue.  HENT: Negative for congestion, ear discharge and sinus pressure.   Eyes: Negative for discharge.  Respiratory: Negative for cough.   Cardiovascular: Negative for chest pain.  Gastrointestinal: Negative for abdominal pain and diarrhea.    Genitourinary: Negative for frequency and hematuria.  Musculoskeletal: Negative for back pain.  Skin: Positive for itching and rash.  Neurological: Negative for seizures and headaches.  Psychiatric/Behavioral: Negative for hallucinations.     Physical Exam Updated Vital Signs BP 131/65   Pulse (!) 101   Temp 98.1 F (36.7 C) (Oral)   Resp 20   Ht '5\' 3"'$  (1.6 m)   Wt 76.2 kg (168 lb)   SpO2 96%   BMI 29.76 kg/m   Physical Exam  Constitutional: She is oriented to person, place, and time. She appears well-developed.  HENT:  Head: Normocephalic.  Rash to face  Eyes: Conjunctivae and EOM are normal. No scleral icterus.  Neck: Neck supple. No thyromegaly present.  Cardiovascular: Normal rate and regular rhythm. Exam reveals no gallop and no friction rub.  No murmur heard. Pulmonary/Chest: No stridor. She has no wheezes. She has no rales. She exhibits no tenderness.  Abdominal: She exhibits no distension. There is no tenderness. There is no rebound.  Genitourinary:  Genitourinary Comments: Rectal exam no stool in vault  Musculoskeletal: Normal range of motion. She exhibits no edema.  Lymphadenopathy:    She has no cervical adenopathy.  Neurological: She is oriented to person, place, and time. She exhibits normal muscle tone. Coordination normal.  Skin: No rash noted. No erythema.  Psychiatric: She has a normal mood and affect. Her behavior is normal.     ED Treatments / Results  Labs (all labs ordered are listed, but only abnormal results are displayed) Labs Reviewed  BASIC METABOLIC PANEL - Abnormal; Notable for the following components:      Result Value   Glucose, Bld 240 (*)    BUN 24 (*)    Creatinine, Ser 1.61 (*)    GFR calc non Af Amer 30 (*)    GFR calc Af Amer 35 (*)    All other components within normal limits  CBC WITH DIFFERENTIAL/PLATELET  TROPONIN I  I-STAT CHEM 8, ED    EKG None  Radiology Dg Chest Portable 1 View  Result Date:  05/09/2017 CLINICAL DATA:  Chest pain EXAM: PORTABLE CHEST 1 VIEW COMPARISON:  01/13/2018 FINDINGS: Chronic cardiopericardial enlargement. Status post median sternotomy. History of COPD with large lung volumes. There is no edema, consolidation, effusion, or pneumothorax. IMPRESSION: No  evidence of active disease. Electronically Signed   By: Monte Fantasia M.D.   On: 05/09/2017 09:44    Procedures Procedures (including critical care time)  Medications Ordered in ED Medications  diphenhydrAMINE (BENADRYL) injection 25 mg (25 mg Intravenous Given 05/09/17 0954)  methylPREDNISolone sodium succinate (SOLU-MEDROL) 125 mg/2 mL injection 125 mg (125 mg Intravenous Given 05/09/17 0954)  famotidine (PEPCID) IVPB 20 mg premix (0 mg Intravenous Stopped 05/09/17 1020)     Initial Impression / Assessment and Plan / ED Course  I have reviewed the triage vital signs and the nursing notes.  Pertinent labs & imaging results that were available during my care of the patient were reviewed by me and considered in my medical decision making (see chart for details).     Patient with allergic reaction that improved with Benadryl and Pepcid.  She has a history of a black stools but her hemoglobin is stable she is not orthostatic and there was no stool in her rectum to check for Hemoccult.  Patient is on a blood thinner which we have told her to stop for the next few days and follow-up with her primary care doctor she is also going to take Benadryl for itching and increase her Prilosec to twice a day.  Final Clinical Impressions(s) / ED Diagnoses   Final diagnoses:  Allergic reaction, initial encounter    ED Discharge Orders    None       Milton Ferguson, MD 05/09/17 1413

## 2017-05-09 NOTE — ED Triage Notes (Signed)
Pt reports has been on eliquis since December and reports since Monday she has had rash on face, lips and throat burning, chest tightness, sob, and black stool. Pt says she called her pcp and was told to come to er.

## 2017-05-10 ENCOUNTER — Encounter: Payer: Self-pay | Admitting: Family Medicine

## 2017-05-10 ENCOUNTER — Other Ambulatory Visit: Payer: Self-pay

## 2017-05-10 ENCOUNTER — Ambulatory Visit (INDEPENDENT_AMBULATORY_CARE_PROVIDER_SITE_OTHER): Payer: Medicare HMO | Admitting: Family Medicine

## 2017-05-10 VITALS — BP 132/66 | HR 80 | Temp 98.9°F | Resp 14 | Ht 63.0 in | Wt 168.0 lb

## 2017-05-10 DIAGNOSIS — E1142 Type 2 diabetes mellitus with diabetic polyneuropathy: Secondary | ICD-10-CM | POA: Diagnosis not present

## 2017-05-10 DIAGNOSIS — E118 Type 2 diabetes mellitus with unspecified complications: Secondary | ICD-10-CM

## 2017-05-10 DIAGNOSIS — R21 Rash and other nonspecific skin eruption: Secondary | ICD-10-CM

## 2017-05-10 DIAGNOSIS — R69 Illness, unspecified: Secondary | ICD-10-CM | POA: Diagnosis not present

## 2017-05-10 LAB — HEMOGLOBIN A1C, FINGERSTICK: HEMOGLOBIN A1C, FINGERSTICK: 6.9 %{Hb} — AB (ref <6.0)

## 2017-05-10 MED ORDER — OMEPRAZOLE 20 MG PO CPDR: 20.0000 mg | DELAYED_RELEASE_CAPSULE | Freq: Every day | ORAL | 3 refills | Status: DC

## 2017-05-10 MED ORDER — FAMOTIDINE 20 MG PO TABS: 20.0000 mg | ORAL_TABLET | Freq: Every day | ORAL | 0 refills | Status: DC

## 2017-05-10 MED ORDER — GABAPENTIN 100 MG PO CAPS: ORAL_CAPSULE | ORAL | 3 refills | Status: DC

## 2017-05-10 NOTE — Progress Notes (Signed)
Subjective:    Patient ID: Laura Best, female    DOB: 1940-10-10, 77 y.o.   MRN: [id number]  Patient presents for ER F/U (allergic rx)  Patient here for ER follow-up.  He complained of itching and swelling to her face and also told him she had some black stools for 2 days. She  Was started on Eliquis back in December for New onset A fib She was noted to have a rash on her face by the ER but there is no swelling of the lips or uvula chest was clear.  They did do a chest x-ray was negative She did not have any other new medications, no new foods   Rectal exam was performed there is no stool in the vault Hemoccult was done.  Her hemoglobin was normal at 12.7.  They also did a troponin which was negative metabolic panel showed chronic kidney disease with a creatinine 1.6 which is also stable for her.  Given Pepcid and Benadryl as well as Solu-Medrol 125 mg in the ER.  Her reaction had improved advised to take Benadryl and increase her Prilosec to twice a day for itching  Itching has improved, she states it came from Eliquis, she called her heart doctor who also told her to stop taking for now  States her face is back to normal   Review Of Systems:  GEN- denies fatigue, fever, weight loss,weakness, recent illness HEENT- denies eye drainage, change in vision, nasal discharge, CVS- denies chest pain, palpitations RESP- denies SOB, cough, wheeze ABD- denies N/V, change in stools, abd pain Neuro- denies headache, dizziness, syncope, seizure activity       Objective:    BP 132/66   Pulse 80   Temp 98.9 F (37.2 C) (Oral)   Resp 14   Ht 5\' 3"  (1.6 m)   Wt 168 lb (76.2 kg)   SpO2 96%   BMI 29.76 kg/m  GEN- NAD, alert and oriented x3 HEENT- PERRL, EOMI, non injected sclera, pink conjunctiva, MMM, oropharynx clear Neck- Supple, no LAD CVS- RRR, systolic murmur RESP-CTAB Skin- mild erythema in patchy places, dry scaley areas, no swelling of face/lipds, few erythematous macular  lesions on arms - she pointed out  EXT- No edema Pulses- Radial, 2+        Assessment & Plan:      Problem List Items Addressed This Visit      Unprioritized   Diabetic peripheral neuropathy associated with type 2 diabetes mellitus (HCC)   Relevant Medications   gabapentin (NEURONTIN) 100 MG capsule   Diabetes mellitus with complication (HCC) - Primary    Medications refilled for patient , and test strips A1C fingerstick at goal < 7% No change to insulin       Relevant Orders   Hemoglobin A1C, fingerstick (Completed)    Other Visit Diagnoses    Rash and nonspecific skin eruption       unclear cause, she states it was eliquis, has talked to cardiology, she is off, will restart ASA 162mg , stools also back to normal. advised to watch for any further changes in bowels. For continued itching decrease her prilosec back to once a day  Given pepcid to take at bedtime for 2 weeks Continue benadryl as needed She states her face looks like normal, I have not seen her recently in order to tell if any significant rash or if these are more sun damage spots  F/U PCP in 1 month  Note: This dictation was prepared with Dragon dictation along with smaller phrase technology. Any transcriptional errors that result from this process are unintentional.

## 2017-05-10 NOTE — Patient Instructions (Signed)
Stop the eliquis Take Aspirin 162mg  once a day ( 2 baby aspirin)  Continue benadryl Take Pepcid at bedtime for 2 weeks  We will call with lab results F/U [place] Ree in 1 month

## 2017-05-12 ENCOUNTER — Encounter: Payer: Self-pay | Admitting: Family Medicine

## 2017-05-12 NOTE — Assessment & Plan Note (Signed)
Medications refilled for patient , and test strips A1C fingerstick at goal < 7% No change to insulin

## 2017-05-15 ENCOUNTER — Encounter: Payer: Self-pay | Admitting: *Deleted

## 2017-06-05 ENCOUNTER — Other Ambulatory Visit (HOSPITAL_COMMUNITY): Payer: Self-pay | Admitting: Cardiology

## 2017-06-05 ENCOUNTER — Other Ambulatory Visit: Payer: Self-pay | Admitting: Physician Assistant

## 2017-06-07 ENCOUNTER — Other Ambulatory Visit: Payer: Self-pay

## 2017-06-07 DIAGNOSIS — Z794 Long term (current) use of insulin: Principal | ICD-10-CM

## 2017-06-07 DIAGNOSIS — E118 Type 2 diabetes mellitus with unspecified complications: Secondary | ICD-10-CM

## 2017-06-07 MED ORDER — GLUCOSE BLOOD VI STRP: ORAL_STRIP | 12 refills | Status: DC

## 2017-06-11 ENCOUNTER — Other Ambulatory Visit: Payer: Self-pay

## 2017-06-11 DIAGNOSIS — Z794 Long term (current) use of insulin: Principal | ICD-10-CM

## 2017-06-11 DIAGNOSIS — E118 Type 2 diabetes mellitus with unspecified complications: Secondary | ICD-10-CM

## 2017-06-11 MED ORDER — ACCU-CHEK SOFT TOUCH LANCETS MISC: 12 refills | Status: DC

## 2017-06-11 MED ORDER — GLUCOSE BLOOD VI STRP: ORAL_STRIP | 12 refills | Status: DC

## 2017-06-12 ENCOUNTER — Other Ambulatory Visit: Payer: Self-pay

## 2017-06-12 MED ORDER — AMLODIPINE BESYLATE 10 MG PO TABS: 10.0000 mg | ORAL_TABLET | Freq: Every day | ORAL | 3 refills | Status: DC

## 2017-06-12 MED ORDER — BLOOD GLUCOSE MONITORING SUPPL KIT: PACK | 0 refills | Status: DC

## 2017-06-12 MED ORDER — FUROSEMIDE 40 MG PO TABS: ORAL_TABLET | ORAL | 3 refills | Status: DC

## 2017-06-12 MED ORDER — CARVEDILOL 12.5 MG PO TABS: 12.5000 mg | ORAL_TABLET | Freq: Two times a day (BID) | ORAL | 3 refills | Status: DC

## 2017-06-12 MED ORDER — INSULIN PEN NEEDLE 32G X 6 MM MISC: 2 refills | Status: DC

## 2017-06-12 NOTE — Telephone Encounter (Signed)
Received refill request from [facility] mail order, refilled coreg, amlodipine, and lasix

## 2017-06-13 ENCOUNTER — Other Ambulatory Visit: Payer: Self-pay | Admitting: Physician Assistant

## 2017-06-20 ENCOUNTER — Telehealth: Payer: Self-pay

## 2017-06-20 MED ORDER — RIVAROXABAN 15 MG PO TABS: 15.0000 mg | ORAL_TABLET | Freq: Every day | ORAL | 6 refills | Status: DC

## 2017-06-20 NOTE — Telephone Encounter (Signed)
-----   Message from Arnoldo Lenis, MD sent at 06/19/2017  3:56 PM EDT ----- Regarding: RE: go back on eliquis Looks like she had more than just dark stools on eliquis, ER notes mention rash and swelling. I would avoid eliquis and try instead xarelto 15mg  daily, stop aspirin. Do we have some samples she could try for 2 weeks and then if tolerates she could get filled at pharmacy   J BrancH MD ----- Message ----- From: Bernita Raisin, RN Sent: 06/19/2017   2:32 PM To: Arnoldo Lenis, MD Subject: go back on eliquis                             Pt states GI has cleared her, wants to know if she can go back on eliquis

## 2017-06-20 NOTE — Telephone Encounter (Signed)
Pt agrees to try Xarelto 15 mg daily for two weeks.She will call back if tolerated and I will e-scribe rx to pharmacy.Pt verbalized understanding to stop aspirin   Samples 3 bottles, lot [id number], exp 2/21

## 2017-06-21 ENCOUNTER — Other Ambulatory Visit: Payer: Self-pay

## 2017-06-21 ENCOUNTER — Encounter: Payer: Self-pay | Admitting: Physician Assistant

## 2017-06-21 ENCOUNTER — Ambulatory Visit (INDEPENDENT_AMBULATORY_CARE_PROVIDER_SITE_OTHER): Payer: Medicare HMO | Admitting: Physician Assistant

## 2017-06-21 VITALS — BP 118/60 | HR 68 | Temp 98.1°F | Resp 14 | Ht 63.0 in | Wt 169.8 lb

## 2017-06-21 DIAGNOSIS — M1000 Idiopathic gout, unspecified site: Secondary | ICD-10-CM | POA: Diagnosis not present

## 2017-06-21 DIAGNOSIS — L03032 Cellulitis of left toe: Secondary | ICD-10-CM | POA: Diagnosis not present

## 2017-06-21 DIAGNOSIS — N183 Chronic kidney disease, stage 3 unspecified: Secondary | ICD-10-CM

## 2017-06-21 DIAGNOSIS — E118 Type 2 diabetes mellitus with unspecified complications: Secondary | ICD-10-CM | POA: Diagnosis not present

## 2017-06-21 MED ORDER — CEPHALEXIN 500 MG PO CAPS: 500.0000 mg | ORAL_CAPSULE | Freq: Four times a day (QID) | ORAL | 0 refills | Status: AC

## 2017-06-21 MED ORDER — PREDNISONE 20 MG PO TABS: ORAL_TABLET | ORAL | 0 refills | Status: DC

## 2017-06-21 NOTE — Progress Notes (Signed)
Patient ID: CIANA SIMMON MRN: [id number], DOB: [birth year], 77 y.o. Date of Encounter: '@DATE'$ @  Chief Complaint:  Chief Complaint  Patient presents with  . left foot red and swollen    noticed it sunday     HPI: 77 y.o. year old female  presents with above.   She states that on Saturday her foot was okay.  Says there was absolutely no problem with the foot on Saturday.  Was having no pain.  Noticed no redness or swelling there.  States that on Sunday morning when she woke up she noticed redness and swelling at this area of her left foot. Asked her to compare what it looked like Sunday compared to today.  Asked if it was better [name] or/ the same.  Says that it "was this bad "when she woke up Sunday morning. She has had no known trauma or injury to the foot. States that she has never had this happen before.  Even in another different joint/location of her body -- has never had this kind of thing before.  Has had no fevers or chills.   Past Medical History:  Diagnosis Date  . Arthritis    "hands, feet" (02/17/2016)  . CAD (coronary artery disease)    a. 05/2016: cath showing nonobstructive CAD with 50% prox-Cx stenosis  . CKD (chronic kidney disease) stage 3, GFR 30-59 ml/min (HCC) 12/25/2012  . COPD (chronic obstructive pulmonary disease) ([place])   . Diastolic heart failure ([place])   . Hyperlipidemia   . Hypertension   . Osteoporosis   . Pneumonia    "several times" (02/17/2016)  . Small bowel obstruction ([place]) 02/17/2016  . Tricuspid regurgitation    Echo 05/02/06-Nml LV. Mild MR. Mod TR  . Type II diabetes mellitus ([place])   . Vitamin D deficiency      Home Meds: Outpatient Medications Prior to Visit  Medication Sig Dispense Refill  . acetaminophen (TYLENOL) 500 MG tablet Take 500 mg by mouth every 6 (six) hours as needed for pain.    Marland Kitchen amLODipine (NORVASC) 10 MG tablet Take 1 tablet (10 mg total) by mouth daily. 90 tablet 3  . Blood Glucose Monitoring Suppl KIT Use to  check blood sugar  Three times daily before meals and at bedtime.ICD10: E11.8.Please dispense based on insurance preference 1 each 0  . calcitRIOL (ROCALTROL) 0.25 MCG capsule Take 0.25 mcg by mouth every Monday, Wednesday, and Friday.     . carvedilol (COREG) 12.5 MG tablet Take 1 tablet (12.5 mg total) by mouth 2 (two) times daily. 180 tablet 3  . Cholecalciferol (VITAMIN D) 2000 UNITS CAPS Take 2,000 Units by mouth daily.     . cloNIDine (CATAPRES) 0.3 MG tablet TAKE 1 TABLET BY MOUTH TWICE DAILY 60 tablet 3  . famotidine (PEPCID) 20 MG tablet Take 1 tablet (20 mg total) by mouth at bedtime. For rash for 2 weeks 14 tablet 0  . furosemide (LASIX) 40 MG tablet Take 60 mg alternating with 40 mg daily. 135 tablet 3  . gabapentin (NEURONTIN) 100 MG capsule TAKE 1 TO 2 CAPSULES BY MOUTH AT BEDTIME FOR PERIPHERAL NEUROPATHY. 120 capsule 3  . glucose blood test strip Use as directed. Check blood sugar 3 times daily before meals and at bedtime. 100 each 12  . Insulin Pen Needle (BD PEN NEEDLE MICRO U/F) 32G X 6 MM MISC Use to check blood sugar three times daily ICD10: e11.8 Dispense based on insurance preference 100 each 2  . Lancets (  ACCU-CHEK SOFT TOUCH) lancets Use to check blood sugar ICD10:e11.8 100 each 12  . Lancets (ONETOUCH ULTRASOFT) lancets Use as instructed 100 each 12  . LEVEMIR FLEXTOUCH 100 UNIT/ML Pen INJECT 22 UNITS SUBCUTANEOUSLY ONCE DAILY AT  10  PM 5 pen 2  . linaclotide (LINZESS) 145 MCG CAPS capsule Take 1 capsule (145 mcg total) by mouth daily before breakfast. 30 capsule 5  . mirabegron ER (MYRBETRIQ) 50 MG TB24 tablet Take 1 tablet (50 mg total) by mouth daily. 30 tablet 5  . NOVOLOG FLEXPEN 100 UNIT/ML FlexPen INJECT 7 UNITS SUBCUTANEOUSLY WITH LUNCH AND SUPPER. INJECT 15 MINUTES PRIOR TO MEAL OR IMMEDIATELY AFTER MEAL. 5 pen 11  . omeprazole (PRILOSEC) 20 MG capsule Take 1 capsule (20 mg total) by mouth daily. 90 capsule 3  . polyethylene glycol (MIRALAX / GLYCOLAX) packet Take  17 g by mouth daily. (Patient taking differently: Take 17 g by mouth daily as needed for mild constipation. ) 14 each 0  . psyllium (HYDROCIL/METAMUCIL) 95 % PACK Take 1 packet by mouth 3 (three) times daily. 56 each 0  . RELION SHORT PEN NEEDLES 31G X 8 MM MISC USE ONE PEN NEEDLE ONCE DAILY AS DIRECTED 50 each 11  . Rivaroxaban (XARELTO) 15 MG TABS tablet Take 1 tablet (15 mg total) by mouth daily. 30 tablet 6  . senna (SENOKOT) 8.6 MG TABS tablet Take 1 tablet (8.6 mg total) by mouth 2 (two) times daily. 120 each 0  . simvastatin (ZOCOR) 20 MG tablet TAKE ONE TABLET ('20MG'$ ) BY MOUTH ONCE DAILY AT BEDTIME 90 tablet 3   No facility-administered medications prior to visit.     Allergies:  Allergies  Allergen Reactions  . Ace Inhibitors Other (See Comments)    Hyperkalemia  . Actos [Pioglitazone] Swelling  . Aspirin Other (See Comments)    G.I. Upset. In high doses  . Eliquis [Apixaban]   . Metformin And Related Diarrhea    Social History   Socioeconomic History  . Marital status: Widowed    Spouse name: Not on file  . Number of children: Not on file  . Years of education: Not on file  . Highest education level: Not on file  Occupational History  . Occupation: retired  Scientific laboratory technician  . Financial resource strain: Not on file  . Food insecurity:    Worry: Not on file    Inability: Not on file  . Transportation needs:    Medical: Not on file    Non-medical: Not on file  Tobacco Use  . Smoking status: Former Smoker    Packs/day: 0.50    Years: 50.00    Pack years: 25.00    Types: Cigarettes    Last attempt to quit: 01/19/2009    Years since quitting: 8.4  . Smokeless tobacco: Never Used  Substance and Sexual Activity  . Alcohol use: No    Alcohol/week: 0.0 oz  . Drug use: No  . Sexual activity: Never  Lifestyle  . Physical activity:    Days per week: Not on file    Minutes per session: Not on file  . Stress: Not on file  Relationships  . Social connections:     Talks on phone: Not on file    Gets together: Not on file    Attends religious service: Not on file    Active member of club or organization: Not on file    Attends meetings of clubs or organizations: Not on file    Relationship  status: Not on file  . Intimate partner violence:    Fear of current or ex partner: Not on file    Emotionally abused: Not on file    Physically abused: Not on file    Forced sexual activity: Not on file  Other Topics Concern  . Not on file  Social History Narrative   Entered 10/2013:   She has never driven.   She lives with her son.    Family History  Problem Relation Age of Onset  . Cancer Father   . Diabetes Father   . Cancer Brother        Brain  . Cancer Sister        abdominal fat     Review of Systems:  See HPI for pertinent ROS. All other ROS negative.    Physical Exam: Blood pressure 118/60, pulse 68, temperature 98.1 F (36.7 C), temperature source Oral, resp. rate 14, height '5\' 3"'$  (1.6 m), weight 77 kg (169 lb 12.8 oz), SpO2 95 %., Body mass index is 30.08 kg/m. General: WF. Appears in no acute distress. Neck: Supple. No thyromegaly. No lymphadenopathy. Lungs: Clear bilaterally to auscultation without wheezes, rales, or rhonchi. Breathing is unlabored. Heart: RRR with S1 S2. No murmurs, rubs, or gallops. Musculoskeletal:  Strength and tone normal for age. Extremities/Skin:  Left Foot:  The area of erythema is well demarcated.  It is a bright inflamed erythema.  It is at MTP joint of 1st toe and extends on dorsum of foot -- proximal to toes.  Inspection of plantar surface of foot is normal--- see no sign of any type of a puncture wound or other trauma or injury.  I see no significant erythema or swelling at the plantar surface of the foot. First MTP joint is severely tender and hypersensitive even with very light touch. Neuro: Alert and oriented X 3. Moves all extremities spontaneously. Gait is normal. CNII-XII grossly in tact. Psych:   Responds to questions appropriately with a normal affect.     ASSESSMENT AND PLAN:  77 y.o. year old female with   1. Idiopathic gout, unspecified chronicity, unspecified site Findings are consistent with gout versus cellulitis. Will check lab to further evaluate. Will go ahead and start antibiotic to cover for possible cellulitis.  Will use Keflex. Also will go ahead and start prednisone to cover for possible gout.  Will go with prednisone rather than colchicine given her CKD.  However I have reviewed with Mrs. Brougham that this medication will cause her blood sugar to increase so for her to not be alarmed if her blood sugar is increased while on this medication that the blood sugar will come down as she comes off of the medicine. Will schedule her a follow-up visit in our office on Tuesday with Leisa. If site worsens in the interim/over the weekend then she is to call our on call provider or go to an urgent care or ER.   Discussed for her to monitor the site and if the redness spreads or worsens, follow-up immediately.  She voices understanding and agrees. - Uric acid - predniSONE (DELTASONE) 20 MG tablet; Take 3 daily for 2 days, then 2 daily for 2 days, then 1 daily for 2 days.  Dispense: 12 tablet; Refill: 0  2. Cellulitis of toe of left foot Findings are consistent with gout versus cellulitis. Will check lab to further evaluate. Will go ahead and start antibiotic to cover for possible cellulitis.  Will use Keflex. Also will go  ahead and start prednisone to cover for possible gout.  Will go with prednisone rather than colchicine given her CKD.  However I have reviewed with Mrs. Duarte that this medication will cause her blood sugar to increase so for her to not be alarmed if her blood sugar is increased while on this medication that the blood sugar will come down as she comes off of the medicine. Will schedule her a follow-up visit in our office on Tuesday with Leisa. If site worsens in the  interim/over the weekend then she is to call our on call provider or go to an urgent care or ER.   Discussed for her to monitor the site and if the redness spreads or worsens, follow-up immediately.  She voices understanding and agrees. - CBC with Differential/Platelet - cephALEXin (KEFLEX) 500 MG capsule; Take 1 capsule (500 mg total) by mouth 4 (four) times daily for 10 days.  Dispense: 40 capsule; Refill: 0  3. CKD (chronic kidney disease) stage 3, GFR 30-59 ml/min (HCC) Given her CKD will use prednisone instead of NSAID. - BASIC METABOLIC PANEL WITH GFR  4. Diabetes mellitus with complication ([place]) Given her diabetes I have cautioned her that the prednisone will make her sugar go up and not to be alarmed that her sugar will come down as she comes off of the prednisone.- BASIC METABOLIC PANEL WITH GFR   Signed, [address] [place], [place], [facility] 06/21/2017 2:45 PM

## 2017-06-22 LAB — BASIC METABOLIC PANEL WITH GFR
BUN / CREAT RATIO: 14 (calc) (ref 6–22)
BUN: 29 mg/dL — AB (ref 7–25)
CALCIUM: 9.2 mg/dL (ref 8.6–10.4)
CHLORIDE: 103 mmol/L (ref 98–110)
CO2: 29 mmol/L (ref 20–32)
Creat: 2.04 mg/dL — ABNORMAL HIGH (ref 0.60–0.93)
GFR, Est African American: 27 mL/min/{1.73_m2} — ABNORMAL LOW (ref >=60)
GFR, Est Non African American: 23 mL/min/{1.73_m2} — ABNORMAL LOW (ref >=60)
GLUCOSE: 124 mg/dL — AB (ref 65–99)
Potassium: 3.8 mmol/L (ref 3.5–5.3)
Sodium: 142 mmol/L (ref 135–146)

## 2017-06-22 LAB — CBC WITH DIFFERENTIAL/PLATELET
BASOS PCT: 1.1 %
Basophils Absolute: 69 cells/uL (ref 0–200)
Eosinophils Absolute: 151 cells/uL (ref 15–500)
Eosinophils Relative: 2.4 %
HCT: 33.5 % — ABNORMAL LOW (ref 35.0–45.0)
Hemoglobin: 11.2 g/dL — ABNORMAL LOW (ref 11.7–15.5)
Lymphs Abs: 1153 cells/uL (ref 850–3900)
MCH: 28 pg (ref 27.0–33.0)
MCHC: 33.4 g/dL (ref 32.0–36.0)
MCV: 83.8 fL (ref 80.0–100.0)
MONOS PCT: 12.3 %
MPV: 11.7 fL (ref 7.5–12.5)
Neutro Abs: 4152 cells/uL (ref 1500–7800)
Neutrophils Relative %: 65.9 %
PLATELETS: 190 10*3/uL (ref 140–400)
RBC: 4 10*6/uL (ref 3.80–5.10)
RDW: 14.1 % (ref 11.0–15.0)
TOTAL LYMPHOCYTE: 18.3 %
WBC mixed population: 775 cells/uL (ref 200–950)
WBC: 6.3 10*3/uL (ref 3.8–10.8)

## 2017-06-22 LAB — URIC ACID: URIC ACID, SERUM: 10.6 mg/dL — AB (ref 2.5–7.0)

## 2017-06-26 ENCOUNTER — Ambulatory Visit: Payer: Medicare HMO | Admitting: Family Medicine

## 2017-06-27 ENCOUNTER — Other Ambulatory Visit: Payer: Self-pay

## 2017-06-27 DIAGNOSIS — E1142 Type 2 diabetes mellitus with diabetic polyneuropathy: Secondary | ICD-10-CM

## 2017-06-27 MED ORDER — CLONIDINE HCL 0.3 MG PO TABS: 0.3000 mg | ORAL_TABLET | Freq: Two times a day (BID) | ORAL | 3 refills | Status: DC

## 2017-06-27 MED ORDER — INSULIN DETEMIR 100 UNIT/ML FLEXPEN: PEN_INJECTOR | SUBCUTANEOUS | 2 refills | Status: DC

## 2017-06-27 MED ORDER — GABAPENTIN 100 MG PO CAPS: ORAL_CAPSULE | ORAL | 3 refills | Status: DC

## 2017-06-27 MED ORDER — INSULIN ASPART 100 UNIT/ML FLEXPEN: PEN_INJECTOR | SUBCUTANEOUS | 11 refills | Status: DC

## 2017-06-27 MED ORDER — CALCITRIOL 0.25 MCG PO CAPS: 0.2500 ug | ORAL_CAPSULE | ORAL | 1 refills | Status: DC

## 2017-06-28 ENCOUNTER — Other Ambulatory Visit: Payer: Self-pay

## 2017-06-28 DIAGNOSIS — E118 Type 2 diabetes mellitus with unspecified complications: Secondary | ICD-10-CM

## 2017-06-28 DIAGNOSIS — Z794 Long term (current) use of insulin: Principal | ICD-10-CM

## 2017-06-28 MED ORDER — BLOOD GLUCOSE MONITORING SUPPL KIT: PACK | 0 refills | Status: DC

## 2017-06-28 MED ORDER — INSULIN PEN NEEDLE 32G X 6 MM MISC: 2 refills | Status: DC

## 2017-06-28 MED ORDER — ACCU-CHEK SOFT TOUCH LANCETS MISC: 12 refills | Status: DC

## 2017-06-28 MED ORDER — GLUCOSE BLOOD VI STRP: ORAL_STRIP | 12 refills | Status: DC

## 2017-07-09 ENCOUNTER — Ambulatory Visit: Payer: Self-pay | Admitting: Physician Assistant

## 2017-07-09 ENCOUNTER — Telehealth: Payer: Self-pay | Admitting: Cardiology

## 2017-07-09 MED ORDER — RIVAROXABAN 15 MG PO TABS: 15.0000 mg | ORAL_TABLET | Freq: Every day | ORAL | 6 refills | Status: DC

## 2017-07-09 NOTE — Telephone Encounter (Signed)
Per pt phone call-- she's needing Xarelto Rx sent to [facility]

## 2017-07-11 ENCOUNTER — Ambulatory Visit: Payer: Medicare HMO | Admitting: Physician Assistant

## 2017-07-12 ENCOUNTER — Ambulatory Visit (INDEPENDENT_AMBULATORY_CARE_PROVIDER_SITE_OTHER): Payer: Medicare HMO | Admitting: Physician Assistant

## 2017-07-12 ENCOUNTER — Encounter: Payer: Self-pay | Admitting: Physician Assistant

## 2017-07-12 VITALS — BP 128/62 | HR 74 | Temp 98.1°F | Resp 16 | Ht 63.0 in | Wt 168.2 lb

## 2017-07-12 DIAGNOSIS — N183 Chronic kidney disease, stage 3 unspecified: Secondary | ICD-10-CM

## 2017-07-12 DIAGNOSIS — M1000 Idiopathic gout, unspecified site: Secondary | ICD-10-CM | POA: Diagnosis not present

## 2017-07-12 MED ORDER — FEBUXOSTAT 40 MG PO TABS: 40.0000 mg | ORAL_TABLET | Freq: Every day | ORAL | 5 refills | Status: DC

## 2017-07-12 MED ORDER — PREDNISONE 20 MG PO TABS: ORAL_TABLET | ORAL | 0 refills | Status: DC

## 2017-07-12 NOTE — Progress Notes (Signed)
Patient ID: ORIEL OJO MRN: [id number], DOB: [birth year], 77 y.o. Date of Encounter: '@DATE'$ @  Chief Complaint:  Chief Complaint  Patient presents with  . left foot follow up with swelling and redness    HPI: 77 y.o. year old female  presents with above.    06/21/2017:  She states that on Saturday her foot was okay.  Says there was absolutely no problem with the foot on Saturday.  Was having no pain.  Noticed no redness or swelling there.  States that on Sunday morning when she woke up she noticed redness and swelling at this area of her left foot. Asked her to compare what it looked like Sunday compared to today.  Asked if it was better [name] or/ the same.  Says that it "was this bad "when she woke up Sunday morning. She has had no known trauma or injury to the foot. States that she has never had this happen before.  Even in another different joint/location of her body -- has never had this kind of thing before.  Has had no fevers or chills.  A/P AT THAT OV: 1. Idiopathic gout, unspecified chronicity, unspecified site 2. Cellulitis of toe of left foot  Findings are consistent with gout versus cellulitis. Will check lab to further evaluate. Will go ahead and start antibiotic to cover for possible cellulitis.  Will use Keflex. Also will go ahead and start prednisone to cover for possible gout.  Will go with prednisone rather than colchicine given her CKD.  However I have reviewed with Mrs. Copen that this medication will cause her blood sugar to increase so for her to not be alarmed if her blood sugar is increased while on this medication that the blood sugar will come down as she comes off of the medicine. Will schedule her a follow-up visit in our office on Tuesday with Leisa. If site worsens in the interim/over the weekend then she is to call our on call provider or go to an urgent care or ER.   Discussed for her to monitor the site and if the redness spreads or worsens,  follow-up immediately.  She voices understanding and agrees. - Uric acid - predniSONE (DELTASONE) 20 MG tablet; Take 3 daily for 2 days, then 2 daily for 2 days, then 1 daily for 2 days.  Dispense: 12 tablet; Refill: 0 - CBC with Differential/Platelet - cephALEXin (KEFLEX) 500 MG capsule; Take 1 capsule (500 mg total) by mouth 4 (four) times daily for 10 days.  Dispense: 40 capsule; Refill: 0  3. CKD (chronic kidney disease) stage 3, GFR 30-59 ml/min (HCC) Given her CKD will use prednisone instead of NSAID. - BASIC METABOLIC PANEL WITH GFR  4. Diabetes mellitus with complication ([place]) Given her diabetes I have cautioned her that the prednisone will make her sugar go up and not to be alarmed that her sugar will come down as she comes off of the prednisone.- BASIC METABOLIC PANEL WITH GFR    --------------------------------------------07/12/2017:--------------------------------------------------------------------------------------------------------- She presents for follow-up visit. She states that she took the prednisone as directed. She states that the redness and pain in the foot is much better.   But, as she is pointing to the first MTP joint, says " every now and then,  that's a little touchy".  Also says that there is still a little bit of redness there.  Today I had a discussion with her regarding gout.   Discussed that gout occurs when you have too much uric acid  in your bloodstream.   Discussed that this can then cause deposits in joints that then cause pain redness swelling. I explained that I can give her a prescription medication that she will take on a daily basis long-term that will help her body to excrete the extra uric acid. Explained that sometimes, when people first start this medication, it is mobilizing uric acid and can actually cause a gout flare-- when it is first started, while her body is adjusting.   I will give her a prednisone taper to take right now while she is  starting Uloric.   Hopefully, this will prevent flare.   Will plan follow-up visit with me in 1 month.   However, she is to follow-up with me sooner if she has any redness or pain or swelling in that joint.   She voices understanding and agrees.    Past Medical History:  Diagnosis Date  . Arthritis    "hands, feet" (02/17/2016)  . CAD (coronary artery disease)    a. 05/2016: cath showing nonobstructive CAD with 50% prox-Cx stenosis  . CKD (chronic kidney disease) stage 3, GFR 30-59 ml/min (HCC) 12/25/2012  . COPD (chronic obstructive pulmonary disease) ([place])   . Diastolic heart failure ([place])   . Hyperlipidemia   . Hypertension   . Osteoporosis   . Pneumonia    "several times" (02/17/2016)  . Small bowel obstruction ([place]) 02/17/2016  . Tricuspid regurgitation    Echo 05/02/06-Nml LV. Mild MR. Mod TR  . Type II diabetes mellitus ([place])   . Vitamin D deficiency      Home Meds: Outpatient Medications Prior to Visit  Medication Sig Dispense Refill  . acetaminophen (TYLENOL) 500 MG tablet Take 500 mg by mouth every 6 (six) hours as needed for pain.    Marland Kitchen amLODipine (NORVASC) 10 MG tablet Take 1 tablet (10 mg total) by mouth daily. 90 tablet 3  . Blood Glucose Monitoring Suppl KIT Use to check blood sugar  Three times daily before meals and at bedtime.ICD10: E11.8.Please dispense based on insurance preference 1 each 0  . calcitRIOL (ROCALTROL) 0.25 MCG capsule Take 1 capsule (0.25 mcg total) by mouth every Monday, Wednesday, and Friday. 90 capsule 1  . carvedilol (COREG) 12.5 MG tablet Take 1 tablet (12.5 mg total) by mouth 2 (two) times daily. 180 tablet 3  . Cholecalciferol (VITAMIN D) 2000 UNITS CAPS Take 2,000 Units by mouth daily.     . cloNIDine (CATAPRES) 0.3 MG tablet Take 1 tablet (0.3 mg total) by mouth 2 (two) times daily. 180 tablet 3  . famotidine (PEPCID) 20 MG tablet Take 1 tablet (20 mg total) by mouth at bedtime. For rash for 2 weeks 14 tablet 0  . furosemide (LASIX) 40 MG  tablet Take 60 mg alternating with 40 mg daily. 135 tablet 3  . gabapentin (NEURONTIN) 100 MG capsule TAKE 1 TO 2 CAPSULES BY MOUTH AT BEDTIME FOR PERIPHERAL NEUROPATHY. 120 capsule 3  . glucose blood test strip Use as directed. Check blood sugar 3 times daily before meals and at bedtime. 100 each 12  . insulin aspart (NOVOLOG FLEXPEN) 100 UNIT/ML FlexPen INJECT 7 UNITS SUBCUTANEOUSLY WITH LUNCH AND SUPPER. INJECT 15 MINUTES PRIOR TO MEAL OR IMMEDIATELY AFTER MEAL. 15 pen 11  . Insulin Detemir (LEVEMIR FLEXTOUCH) 100 UNIT/ML Pen INJECT 22 UNITS SUBCUTANEOUSLY ONCE DAILY AT  10  PM 15 pen 2  . Insulin Pen Needle (BD PEN NEEDLE MICRO U/F) 32G X 6 MM MISC Use to check  blood sugar three times daily ICD10: e11.8 Dispense based on insurance preference 100 each 2  . Lancets (ACCU-CHEK SOFT TOUCH) lancets Use to check blood sugar ICD10:e11.8 Dispense based on insurance preference 100 each 12  . Lancets (ONETOUCH ULTRASOFT) lancets Use as instructed 100 each 12  . linaclotide (LINZESS) 145 MCG CAPS capsule Take 1 capsule (145 mcg total) by mouth daily before breakfast. 30 capsule 5  . mirabegron ER (MYRBETRIQ) 50 MG TB24 tablet Take 1 tablet (50 mg total) by mouth daily. 30 tablet 5  . omeprazole (PRILOSEC) 20 MG capsule Take 1 capsule (20 mg total) by mouth daily. 90 capsule 3  . psyllium (HYDROCIL/METAMUCIL) 95 % PACK Take 1 packet by mouth 3 (three) times daily. 56 each 0  . RELION SHORT PEN NEEDLES 31G X 8 MM MISC USE ONE PEN NEEDLE ONCE DAILY AS DIRECTED 50 each 11  . Rivaroxaban (XARELTO) 15 MG TABS tablet Take 1 tablet (15 mg total) by mouth daily. 30 tablet 6  . simvastatin (ZOCOR) 20 MG tablet TAKE ONE TABLET ('20MG'$ ) BY MOUTH ONCE DAILY AT BEDTIME 90 tablet 3  . polyethylene glycol (MIRALAX / GLYCOLAX) packet Take 17 g by mouth daily. (Patient taking differently: Take 17 g by mouth daily as needed for mild constipation. ) 14 each 0  . predniSONE (DELTASONE) 20 MG tablet Take 3 daily for 2 days, then 2  daily for 2 days, then 1 daily for 2 days. 12 tablet 0  . senna (SENOKOT) 8.6 MG TABS tablet Take 1 tablet (8.6 mg total) by mouth 2 (two) times daily. 120 each 0   No facility-administered medications prior to visit.     Allergies:  Allergies  Allergen Reactions  . Ace Inhibitors Other (See Comments)    Hyperkalemia  . Actos [Pioglitazone] Swelling  . Aspirin Other (See Comments)    G.I. Upset. In high doses  . Eliquis [Apixaban]   . Metformin And Related Diarrhea    Social History   Socioeconomic History  . Marital status: Widowed    Spouse name: Not on file  . Number of children: Not on file  . Years of education: Not on file  . Highest education level: Not on file  Occupational History  . Occupation: retired  Scientific laboratory technician  . Financial resource strain: Not on file  . Food insecurity:    Worry: Not on file    Inability: Not on file  . Transportation needs:    Medical: Not on file    Non-medical: Not on file  Tobacco Use  . Smoking status: Former Smoker    Packs/day: 0.50    Years: 50.00    Pack years: 25.00    Types: Cigarettes    Last attempt to quit: 01/19/2009    Years since quitting: 8.4  . Smokeless tobacco: Never Used  Substance and Sexual Activity  . Alcohol use: No    Alcohol/week: 0.0 oz  . Drug use: No  . Sexual activity: Never  Lifestyle  . Physical activity:    Days per week: Not on file    Minutes per session: Not on file  . Stress: Not on file  Relationships  . Social connections:    Talks on phone: Not on file    Gets together: Not on file    Attends religious service: Not on file    Active member of club or organization: Not on file    Attends meetings of clubs or organizations: Not on file  Relationship status: Not on file  . Intimate partner violence:    Fear of current or ex partner: Not on file    Emotionally abused: Not on file    Physically abused: Not on file    Forced sexual activity: Not on file  Other Topics Concern  .  Not on file  Social History Narrative   Entered 10/2013:   She has never driven.   She lives with her son.    Family History  Problem Relation Age of Onset  . Cancer Father   . Diabetes Father   . Cancer Brother        Brain  . Cancer Sister        abdominal fat     Review of Systems:  See HPI for pertinent ROS. All other ROS negative.    Physical Exam: Blood pressure 128/62, pulse 74, temperature 98.1 F (36.7 C), temperature source Oral, resp. rate 16, height '5\' 3"'$  (1.6 m), weight 76.3 kg (168 lb 3.2 oz), SpO2 94 %., Body mass index is 29.8 kg/m. General: WNWD WF. Appears in no acute distress. Neck: Supple. No thyromegaly. No lymphadenopathy. Lungs: Clear bilaterally to auscultation without wheezes, rales, or rhonchi. Breathing is unlabored. Heart: RRR with S1 S2. No murmurs, rubs, or gallops. Musculoskeletal:  Strength and tone normal for age. Extremities/Skin:  Left Foot: There is very minimal erythema at the left first MTP joint compared to the right first MTP joint region.  Very minimal erythema extends from that area towards the dorsum surface of foot approximate 1 inch.  Is much improved compared to last visit.  There is minimal tenderness even with touch to the first MTP joint.  No swelling present.  Neuro: Alert and oriented X 3. Moves all extremities spontaneously. Gait is normal. CNII-XII grossly in tact. Psych:  Responds to questions appropriately with a normal affect.      ASSESSMENT AND PLAN:  77 y.o. year old female with   1. Idiopathic gout, unspecified chronicity, unspecified site - febuxostat (ULORIC) 40 MG tablet; Take 1 tablet (40 mg total) by mouth daily.  Dispense: 30 tablet; Refill: 5 - predniSONE (DELTASONE) 20 MG tablet; Take 3 daily for 2 days, then 2 daily for 2 days, then 1 daily for 2 days.  Dispense: 12 tablet; Refill: 0  3. CKD (chronic kidney disease) stage 3, GFR 30-59 ml/min (HCC) Given her CKD will use prednisone instead of NSAID. -  febuxostat (ULORIC) 40 MG tablet; Take 1 tablet (40 mg total) by mouth daily.  Dispense: 30 tablet; Refill: 5 - predniSONE (DELTASONE) 20 MG tablet; Take 3 daily for 2 days, then 2 daily for 2 days, then 1 daily for 2 days.  Dispense: 12 tablet; Refill: 0  4. Diabetes mellitus with complication ([place]) Given her diabetes I have cautioned her that the prednisone will make her sugar go up and not to be alarmed that her sugar will come down as she comes off of the prednisone. - febuxostat (ULORIC) 40 MG tablet; Take 1 tablet (40 mg total) by mouth daily.  Dispense: 30 tablet; Refill: 5 - predniSONE (DELTASONE) 20 MG tablet; Take 3 daily for 2 days, then 2 daily for 2 days, then 1 daily for 2 days.  Dispense: 12 tablet; Refill: 0  Today I had a discussion with her regarding gout.   Discussed that gout occurs when you have too much uric acid in your bloodstream.   Discussed that this can then cause deposits in joints that then  cause pain redness swelling. I explained that I can give her a prescription medication that she will take on a daily basis long-term that will help her body to excrete the extra uric acid. Explained that sometimes, when people first start this medication, it is mobilizing uric acid and can actually cause a gout flare-- when it is first started, while her body is adjusting.   I will give her a prednisone taper to take right now while she is starting Uloric.   Hopefully, this will prevent flare.   Will plan follow-up visit with me in 1 month.   However, she is to follow-up with me sooner if she has any redness or pain or swelling in that joint.   She voices understanding and agrees.    [address] [place], [place], [place] 07/12/2017 12:22 PM

## 2017-07-16 ENCOUNTER — Telehealth: Payer: Self-pay

## 2017-07-16 NOTE — Telephone Encounter (Signed)
Patient is calling requesting rx for depends and chux pads.Patient states she is having trouble with Incontinence

## 2017-07-16 NOTE — Telephone Encounter (Signed)
Has she already been using these products ? I do not recall her informing me of this problem at prior visit-----has this issue been evaluated?

## 2017-07-18 NOTE — Telephone Encounter (Signed)
Call placed to patient she states her symptoms have improved and she brought her own so she no longer needs a rx

## 2017-08-09 ENCOUNTER — Ambulatory Visit (INDEPENDENT_AMBULATORY_CARE_PROVIDER_SITE_OTHER): Payer: Medicare HMO | Admitting: Physician Assistant

## 2017-08-09 ENCOUNTER — Encounter: Payer: Self-pay | Admitting: Physician Assistant

## 2017-08-09 VITALS — BP 130/68 | HR 65 | Temp 99.0°F | Resp 14 | Ht 63.0 in | Wt 173.8 lb

## 2017-08-09 DIAGNOSIS — M1000 Idiopathic gout, unspecified site: Secondary | ICD-10-CM | POA: Diagnosis not present

## 2017-08-09 DIAGNOSIS — Z794 Long term (current) use of insulin: Secondary | ICD-10-CM | POA: Diagnosis not present

## 2017-08-09 DIAGNOSIS — N183 Chronic kidney disease, stage 3 unspecified: Secondary | ICD-10-CM

## 2017-08-09 DIAGNOSIS — E1121 Type 2 diabetes mellitus with diabetic nephropathy: Secondary | ICD-10-CM | POA: Diagnosis not present

## 2017-08-09 MED ORDER — FEBUXOSTAT 40 MG PO TABS: 40.0000 mg | ORAL_TABLET | Freq: Every day | ORAL | 5 refills | Status: DC

## 2017-08-09 MED ORDER — PREDNISONE 20 MG PO TABS: ORAL_TABLET | ORAL | 0 refills | Status: DC

## 2017-08-09 NOTE — Progress Notes (Signed)
Patient ID: MELANNY WIRE MRN: [id number], DOB: 06/03/1940, 77 y.o. Date of Encounter: _0 @  Chief Complaint:  Chief Complaint  Patient presents with  . left foot swollen    HPI: 77 y.o. year old female  presents with above.    06/21/2017:  She states that on Saturday her foot was okay.  Says there was absolutely no problem with the foot on Saturday.  Was having no pain.  Noticed no redness or swelling there.  States that on Sunday morning when she woke up she noticed redness and swelling at this area of her left foot. Asked her to compare what it looked like Sunday compared to today.  Asked if it was better Laura Best or/ the same.  Says that it "was this bad "when she woke up Sunday morning. She has had no known trauma or injury to the foot. States that she has never had this happen before.  Even in another different joint/location of her body -- has never had this kind of thing before.  Has had no fevers or chills.  A/P AT THAT OV: 1. Idiopathic gout, unspecified chronicity, unspecified site 2. Cellulitis of toe of left foot  Findings are consistent with gout versus cellulitis. Will check lab to further evaluate. Will go ahead and start antibiotic to cover for possible cellulitis.  Will use Keflex. Also will go ahead and start prednisone to cover for possible gout.  Will go with prednisone rather than colchicine given her CKD.  However I have reviewed with Laura Best that this medication will cause her blood sugar to increase so for her to not be alarmed if her blood sugar is increased while on this medication that the blood sugar will come down as she comes off of the medicine. Will schedule her a follow-up visit in our office on Tuesday with Leisa. If site worsens in the interim/over the weekend then she is to call our on call provider or go to an urgent care or ER.   Discussed for her to monitor the site and if the redness spreads or worsens, follow-up immediately.  She voices  understanding and agrees. - Uric acid - predniSONE (DELTASONE) 20 MG tablet; Take 3 daily for 2 days, then 2 daily for 2 days, then 1 daily for 2 days.  Dispense: 12 tablet; Refill: 0 - CBC with Differential/Platelet - cephALEXin (KEFLEX) 500 MG capsule; Take 1 capsule (500 mg total) by mouth 4 (four) times daily for 10 days.  Dispense: 40 capsule; Refill: 0  3. CKD (chronic kidney disease) stage 3, GFR 30-59 ml/min (HCC) Given her CKD will use prednisone instead of NSAID. - BASIC METABOLIC PANEL WITH GFR  4. Diabetes mellitus with complication ([place]) Given her diabetes I have cautioned her that the prednisone will make her sugar go up and not to be alarmed that her sugar will come down as she comes off of the prednisone.- BASIC METABOLIC PANEL WITH GFR    --------------------------------------------07/12/2017:--------------------------------------------------------------------------------------------------------- She presents for follow-up visit. She states that she took the prednisone as directed. She states that the redness and pain in the foot is much better.   But, as she is pointing to the first MTP joint, says " every now and then,  that's a little touchy".  Also says that there is still a little bit of redness there.  Today I had a discussion with her regarding gout.   Discussed that gout occurs when you have too much uric acid in your bloodstream.  Discussed that this can then cause deposits in joints that then cause pain redness swelling. I explained that I can give her a prescription medication that she will take on a daily basis long-term that will help her body to excrete the extra uric acid. Explained that sometimes, when people first start this medication, it is mobilizing uric acid and can actually cause a gout flare-- when it is first started, while her body is adjusting.   I will give her a prednisone taper to take right now while she is starting Uloric.   Hopefully, this  will prevent flare.   Will plan follow-up visit with me in 1 month.   However, she is to follow-up with me sooner if she has any redness or pain or swelling in that joint.   She voices understanding and agrees. ---------A/P AT THAT OV 07/12/2017:--------------------------------- 1. Idiopathic gout, unspecified chronicity, unspecified site - febuxostat (ULORIC) 40 MG tablet; Take 1 tablet (40 mg total) by mouth daily.  Dispense: 30 tablet; Refill: 5 - predniSONE (DELTASONE) 20 MG tablet; Take 3 daily for 2 days, then 2 daily for 2 days, then 1 daily for 2 days.  Dispense: 12 tablet; Refill: 0  3. CKD (chronic kidney disease) stage 3, GFR 30-59 ml/min (HCC) Given her CKD will use prednisone instead of NSAID. - febuxostat (ULORIC) 40 MG tablet; Take 1 tablet (40 mg total) by mouth daily.  Dispense: 30 tablet; Refill: 5 - predniSONE (DELTASONE) 20 MG tablet; Take 3 daily for 2 days, then 2 daily for 2 days, then 1 daily for 2 days.  Dispense: 12 tablet; Refill: 0  4. Diabetes mellitus with complication ([place]) Given her diabetes I have cautioned her that the prednisone will make her sugar go up and not to be alarmed that her sugar will come down as she comes off of the prednisone. - febuxostat (ULORIC) 40 MG tablet; Take 1 tablet (40 mg total) by mouth daily.  Dispense: 30 tablet; Refill: 5 - predniSONE (DELTASONE) 20 MG tablet; Take 3 daily for 2 days, then 2 daily for 2 days, then 1 daily for 2 days.  Dispense: 12 tablet; Refill: 0  Today I had a discussion with her regarding gout.   Discussed that gout occurs when you have too much uric acid in your bloodstream.   Discussed that this can then cause deposits in joints that then cause pain redness swelling. I explained that I can give her a prescription medication that she will take on a daily basis long-term that will help her body to excrete the extra uric acid. Explained that sometimes, when people first start this medication, it is mobilizing  uric acid and can actually cause a gout flare-- when it is first started, while her body is adjusting.   I will give her a prednisone taper to take right now while she is starting Uloric.   Hopefully, this will prevent flare.   Will plan follow-up visit with me in 1 month.   However, she is to follow-up with me sooner if she has any redness or pain or swelling in that joint.   She voices understanding and agrees.    08/09/2017: Today patient's left first MTP joint region has some pink erythema and mild pain. She states that the redness started coming back this week starting on this past Monday she started noticing it looking pink again. States that the pink color had improved/resolved but then started coming back this past Monday. (It is currently Thursday.)  She  confirms that she has been taking the Uloric daily.  States that she also took the prednisone as directed.  She has no other specific concerns to address. States that she is going out of town tomorrow to her brothers to celebrate her birthday which is the Saturday.  Says that he has a recliner that he is planning for her to sit in so she can prop her feet.  Says that she will be back in town and able to come back here for follow-up visit next week.    Past Medical History:  Diagnosis Date  . Arthritis    "hands, feet" (02/17/2016)  . CAD (coronary artery disease)    a. 05/2016: cath showing nonobstructive CAD with 50% prox-Cx stenosis  . CKD (chronic kidney disease) stage 3, GFR 30-59 ml/min (HCC) 12/25/2012  . COPD (chronic obstructive pulmonary disease) ([place])   . Diastolic heart failure ([place])   . Hyperlipidemia   . Hypertension   . Osteoporosis   . Pneumonia    "several times" (02/17/2016)  . Small bowel obstruction ([place]) 02/17/2016  . Tricuspid regurgitation    Echo 05/02/06-Nml LV. Mild MR. Mod TR  . Type II diabetes mellitus ([place])   . Vitamin D deficiency      Home Meds: Outpatient Medications Prior to Visit    Medication Sig Dispense Refill  . acetaminophen (TYLENOL) 500 MG tablet Take 500 mg by mouth every 6 (six) hours as needed for pain.    Marland Kitchen amLODipine (NORVASC) 10 MG tablet Take 1 tablet (10 mg total) by mouth daily. 90 tablet 3  . Blood Glucose Monitoring Suppl KIT Use to check blood sugar  Three times daily before meals and at bedtime.ICD10: E11.8.Please dispense based on insurance preference 1 each 0  . calcitRIOL (ROCALTROL) 0.25 MCG capsule Take 1 capsule (0.25 mcg total) by mouth every Monday, Wednesday, and Friday. 90 capsule 1  . carvedilol (COREG) 12.5 MG tablet Take 1 tablet (12.5 mg total) by mouth 2 (two) times daily. 180 tablet 3  . Cholecalciferol (VITAMIN D) 2000 UNITS CAPS Take 2,000 Units by mouth daily.     . cloNIDine (CATAPRES) 0.3 MG tablet Take 1 tablet (0.3 mg total) by mouth 2 (two) times daily. 180 tablet 3  . famotidine (PEPCID) 20 MG tablet Take 1 tablet (20 mg total) by mouth at bedtime. For rash for 2 weeks 14 tablet 0  . furosemide (LASIX) 40 MG tablet Take 60 mg alternating with 40 mg daily. 135 tablet 3  . gabapentin (NEURONTIN) 100 MG capsule TAKE 1 TO 2 CAPSULES BY MOUTH AT BEDTIME FOR PERIPHERAL NEUROPATHY. 120 capsule 3  . glucose blood test strip Use as directed. Check blood sugar 3 times daily before meals and at bedtime. 100 each 12  . insulin aspart (NOVOLOG FLEXPEN) 100 UNIT/ML FlexPen INJECT 7 UNITS SUBCUTANEOUSLY WITH LUNCH AND SUPPER. INJECT 15 MINUTES PRIOR TO MEAL OR IMMEDIATELY AFTER MEAL. 15 pen 11  . Insulin Detemir (LEVEMIR FLEXTOUCH) 100 UNIT/ML Pen INJECT 22 UNITS SUBCUTANEOUSLY ONCE DAILY AT  10  PM 15 pen 2  . Insulin Pen Needle (BD PEN NEEDLE MICRO U/F) 32G X 6 MM MISC Use to check blood sugar three times daily ICD10: e11.8 Dispense based on insurance preference 100 each 2  . Lancets (ACCU-CHEK SOFT TOUCH) lancets Use to check blood sugar ICD10:e11.8 Dispense based on insurance preference 100 each 12  . Lancets (ONETOUCH ULTRASOFT) lancets Use  as instructed 100 each 12  . linaclotide (LINZESS) 145 MCG CAPS  capsule Take 1 capsule (145 mcg total) by mouth daily before breakfast. 30 capsule 5  . mirabegron ER (MYRBETRIQ) 50 MG TB24 tablet Take 1 tablet (50 mg total) by mouth daily. 30 tablet 5  . omeprazole (PRILOSEC) 20 MG capsule Take 1 capsule (20 mg total) by mouth daily. 90 capsule 3  . psyllium (HYDROCIL/METAMUCIL) 95 % PACK Take 1 packet by mouth 3 (three) times daily. 56 each 0  . RELION SHORT PEN NEEDLES 31G X 8 MM MISC USE ONE PEN NEEDLE ONCE DAILY AS DIRECTED 50 each 11  . Rivaroxaban (XARELTO) 15 MG TABS tablet Take 1 tablet (15 mg total) by mouth daily. 30 tablet 6  . simvastatin (ZOCOR) 20 MG tablet TAKE ONE TABLET (20MG) BY MOUTH ONCE DAILY AT BEDTIME 90 tablet 3  . febuxostat (ULORIC) 40 MG tablet Take 1 tablet (40 mg total) by mouth daily. 30 tablet 5  . predniSONE (DELTASONE) 20 MG tablet Take 3 daily for 2 days, then 2 daily for 2 days, then 1 daily for 2 days. 12 tablet 0   No facility-administered medications prior to visit.     Allergies:  Allergies  Allergen Reactions  . Ace Inhibitors Other (See Comments)    Hyperkalemia  . Actos [Pioglitazone] Swelling  . Aspirin Other (See Comments)    G.I. Upset. In high doses  . Eliquis [Apixaban]   . Metformin And Related Diarrhea    Social History   Socioeconomic History  . Marital status: Widowed    Spouse name: Not on file  . Number of children: Not on file  . Years of education: Not on file  . Highest education level: Not on file  Occupational History  . Occupation: retired  Scientific laboratory technician  . Financial resource strain: Not on file  . Food insecurity:    Worry: Not on file    Inability: Not on file  . Transportation needs:    Medical: Not on file    Non-medical: Not on file  Tobacco Use  . Smoking status: Former Smoker    Packs/day: 0.50    Years: 50.00    Pack years: 25.00    Types: Cigarettes    Last attempt to quit: 01/19/2009    Years since  quitting: 8.5  . Smokeless tobacco: Never Used  Substance and Sexual Activity  . Alcohol use: No    Alcohol/week: 0.0 oz  . Drug use: No  . Sexual activity: Never  Lifestyle  . Physical activity:    Days per week: Not on file    Minutes per session: Not on file  . Stress: Not on file  Relationships  . Social connections:    Talks on phone: Not on file    Gets together: Not on file    Attends religious service: Not on file    Active member of club or organization: Not on file    Attends meetings of clubs or organizations: Not on file    Relationship status: Not on file  . Intimate partner violence:    Fear of current or ex partner: Not on file    Emotionally abused: Not on file    Physically abused: Not on file    Forced sexual activity: Not on file  Other Topics Concern  . Not on file  Social History Narrative   Entered 10/2013:   She has never driven.   She lives with her son.    Family History  Problem Relation Age of Onset  . Cancer  Father   . Diabetes Father   . Cancer Brother        Brain  . Cancer Sister        abdominal fat     Review of Systems:  See HPI for pertinent ROS. All other ROS negative.    Physical Exam: Blood pressure 130/68, pulse 65, temperature 99 F (37.2 C), temperature source Oral, resp. rate 14, height _0  (1.6 m), weight 78.8 kg (173 lb 12.8 oz), SpO2 94 %., Body mass index is 30.79 kg/m. General: WNWD WF. Appears in no acute distress. Neck: Supple. No thyromegaly. No lymphadenopathy. Lungs: Clear bilaterally to auscultation without wheezes, rales, or rhonchi. Breathing is unlabored. Heart: RRR with S1 S2. No murmurs, rubs, or gallops. Musculoskeletal:  Strength and tone normal for age. Extremities/Skin:  Left Foot: There is mild pink erythema at the left first MTP joint.  Mild tenderness at this area.  Very minimal swelling. Neuro: Alert and oriented X 3. Moves all extremities spontaneously. Gait is normal. CNII-XII grossly in  tact. Psych:  Responds to questions appropriately with a normal affect.      ASSESSMENT AND PLAN:  77 y.o. year old female with   1. Idiopathic gout, unspecified chronicity, unspecified site - febuxostat (ULORIC) 40 MG tablet; Take 1 tablet (40 mg total) by mouth daily.  Dispense: 30 tablet; Refill: 5 - predniSONE (DELTASONE) 20 MG tablet; Take 2 daily for 3 days then take 1 daily for 6 days.  Dispense: 12 tablet; Refill: 0  2. Type 2 diabetes mellitus with diabetic nephropathy, with long-term current use of insulin ([place])  3. CKD (chronic kidney disease) stage 3, GFR 30-59 ml/min (HCC) - febuxostat (ULORIC) 40 MG tablet; Take 1 tablet (40 mg total) by mouth daily.  Dispense: 30 tablet; Refill: 5   Given CKD, avoid NSAID.  Use Prednisone to control flare.  Aware that prednisone will increase glucose.  Aware of recent new warning with using Uloric in patients with cardiovascualr disease, but her recent Uric Acid level was 10.6. Given her CKD, would have to limit Allopurinol to 30m QD----This is not goin to control her Gout.  We will have her take prednisone 40 mg daily for 3 days then 20 mg daily for 6 days to try to treat current flare. Have her schedule follow-up visit with me in 1 week to make sure this is resolved.  Follow-up sooner if worsens/if needed.  Also noted that last A1c was 05/10/2017.  Will be due to recheck A1c at next visit.   Signed, M[address]DHypoluxo PUtah BProv[facility]7/18/2019 2:20 PM

## 2017-08-10 ENCOUNTER — Telehealth: Payer: Self-pay

## 2017-08-10 NOTE — Telephone Encounter (Signed)
Allopurinol 50mg  1 po QD # 30 + 5.

## 2017-08-10 NOTE — Telephone Encounter (Signed)
Received fax from pharmacy that Laura Best is not covered by insurance. Attempted Prior authorization and it shows allopurinol is preferred. Is it okay to make the switch? Pls advise

## 2017-08-14 MED ORDER — ALLOPURINOL 100 MG PO TABS: ORAL_TABLET | ORAL | 6 refills | Status: DC

## 2017-08-14 NOTE — Telephone Encounter (Signed)
Yes.document as 1/2 daily.

## 2017-08-14 NOTE — Telephone Encounter (Signed)
Yes. Because of her kidneys, have to use low dose.

## 2017-08-14 NOTE — Telephone Encounter (Signed)
Are you sure this should be 50mg  and not 150mg ? Not coming up as 50mg 

## 2017-08-14 NOTE — Telephone Encounter (Signed)
Call placed to pharmacy 100 mg is the lowest dose. Will only be able to send in rx for 100 mg and have patient cut in half

## 2017-08-15 ENCOUNTER — Ambulatory Visit (INDEPENDENT_AMBULATORY_CARE_PROVIDER_SITE_OTHER): Payer: Medicare HMO | Admitting: Physician Assistant

## 2017-08-15 ENCOUNTER — Encounter: Payer: Self-pay | Admitting: Physician Assistant

## 2017-08-15 VITALS — BP 130/62 | HR 74 | Temp 98.0°F | Resp 14 | Ht 63.0 in | Wt 171.2 lb

## 2017-08-15 DIAGNOSIS — E118 Type 2 diabetes mellitus with unspecified complications: Secondary | ICD-10-CM

## 2017-08-15 DIAGNOSIS — M1000 Idiopathic gout, unspecified site: Secondary | ICD-10-CM | POA: Diagnosis not present

## 2017-08-15 DIAGNOSIS — E1142 Type 2 diabetes mellitus with diabetic polyneuropathy: Secondary | ICD-10-CM

## 2017-08-15 DIAGNOSIS — N183 Chronic kidney disease, stage 3 unspecified: Secondary | ICD-10-CM

## 2017-08-15 DIAGNOSIS — Z794 Long term (current) use of insulin: Secondary | ICD-10-CM

## 2017-08-15 DIAGNOSIS — E1121 Type 2 diabetes mellitus with diabetic nephropathy: Secondary | ICD-10-CM

## 2017-08-15 MED ORDER — PREDNISONE 20 MG PO TABS: ORAL_TABLET | ORAL | 0 refills | Status: DC

## 2017-08-15 NOTE — Progress Notes (Signed)
Patient ID: Laura Best MRN: [id number], DOB: 08/22/1940, 77 y.o. Date of Encounter: '@DATE'$ @  Chief Complaint:  Chief Complaint  Patient presents with  . 1 week follow up with gout    HPI: 77 y.o. year old female  presents with above.    06/21/2017:  She states that on Saturday her foot was okay.  Says there was absolutely no problem with the foot on Saturday.  Was having no pain.  Noticed no redness or swelling there.  States that on Sunday morning when she woke up she noticed redness and swelling at this area of her left foot. Asked her to compare what it looked like Sunday compared to today.  Asked if it was better Laura Best or/ the same.  Says that it "was this bad "when she woke up Sunday morning. She has had no known trauma or injury to the foot. States that she has never had this happen before.  Even in another different joint/location of her body -- has never had this kind of thing before.  Has had no fevers or chills.  A/P AT THAT OV: 1. Idiopathic gout, unspecified chronicity, unspecified site 2. Cellulitis of toe of left foot  Findings are consistent with gout versus cellulitis. Will check lab to further evaluate. Will go ahead and start antibiotic to cover for possible cellulitis.  Will use Keflex. Also will go ahead and start prednisone to cover for possible gout.  Will go with prednisone rather than colchicine given her CKD.  However I have reviewed with Laura Best that this medication will cause her blood sugar to increase so for her to not be alarmed if her blood sugar is increased while on this medication that the blood sugar will come down as she comes off of the medicine. Will schedule her a follow-up visit in our office on Tuesday with Leisa. If site worsens in the interim/over the weekend then she is to call our on call provider or go to an urgent care or ER.   Discussed for her to monitor the site and if the redness spreads or worsens, follow-up immediately.   She voices understanding and agrees. - Uric acid - predniSONE (DELTASONE) 20 MG tablet; Take 3 daily for 2 days, then 2 daily for 2 days, then 1 daily for 2 days.  Dispense: 12 tablet; Refill: 0 - CBC with Differential/Platelet - cephALEXin (KEFLEX) 500 MG capsule; Take 1 capsule (500 mg total) by mouth 4 (four) times daily for 10 days.  Dispense: 40 capsule; Refill: 0  3. CKD (chronic kidney disease) stage 3, GFR 30-59 ml/min (HCC) Given her CKD will use prednisone instead of NSAID. - BASIC METABOLIC PANEL WITH GFR  4. Diabetes mellitus with complication ([place]) Given her diabetes I have cautioned her that the prednisone will make her sugar go up and not to be alarmed that her sugar will come down as she comes off of the prednisone.- BASIC METABOLIC PANEL WITH GFR    --------------------------------------------07/12/2017:--------------------------------------------------------------------------------------------------------- She presents for follow-up visit. She states that she took the prednisone as directed. She states that the redness and pain in the foot is much better.   But, as she is pointing to the first MTP joint, says " every now and then,  that's a little touchy".  Also says that there is still a little bit of redness there.  77 y.o.  Today I had a discussion with her regarding gout.   Discussed that gout occurs when you have too much uric acid in your  bloodstream.   Discussed that this can then cause deposits in joints that then cause pain redness swelling. I explained that I can give her a prescription medication that she will take on a daily basis long-term that will help her body to excrete the extra uric acid. Explained that sometimes, when people first start this medication, it is mobilizing uric acid and can actually cause a gout flare-- when it is first started, while her body is adjusting.   I will give her a prednisone taper to take right now 77 y.o. while she is starting Uloric.     Hopefully, this will prevent flare.   Will plan follow-up visit with me in 1 month.   However, she is to follow-up with me sooner if she has any redness or pain or swelling in that joint.   She voices understanding and agrees. ---------A/P AT THAT OV 07/12/2017:--------------------------------- 1. Idiopathic gout, unspecified chronicity, unspecified site - febuxostat (ULORIC) 40 MG tablet; Take 1 tablet (40 mg total) by mouth daily.  Dispense: 30 tablet; Refill: 5 - predniSONE (DELTASONE) 20 MG tablet; Take 3 daily for 2 days, then 2 daily for 2 days, then 1 daily for 2 days.  Dispense: 12 tablet; Refill: 0  3. CKD (chronic kidney disease) stage 3, GFR 30-59 ml/min (HCC) Given her CKD will use prednisone instead of NSAID. - febuxostat (ULORIC) 40 MG tablet; Take 1 tablet (40 mg total) by mouth daily.  Dispense: 30 tablet; Refill: 5 - predniSONE (DELTASONE) 20 MG tablet; Take 3 daily for 2 days, then 2 daily for 2 days, then 1 daily for 2 days.  Dispense: 12 tablet; Refill: 0  4. Diabetes mellitus with complication ([place]) Given her diabetes I have cautioned her that the prednisone will make her sugar go up and not to be alarmed that her sugar will come down as she comes off of the prednisone. - febuxostat (ULORIC) 40 MG tablet; Take 1 tablet (40 mg total) by mouth daily.  Dispense: 30 tablet; Refill: 5 - predniSONE (DELTASONE) 20 MG tablet; Take 3 daily for 2 days, then 2 daily for 2 days, then 1 daily for 2 days.  Dispense: 12 tablet; Refill: 0  77 y.o.  Today I had a discussion with her regarding gout.   Discussed that gout occurs when you have too much uric acid in your bloodstream.   Discussed that this can then cause deposits in joints that then cause pain redness swelling. I explained that I can give her a prescription medication that she will take on a daily basis long-term that will help her body to excrete the extra uric acid. Explained that sometimes, when people first start this medication,  it is mobilizing uric acid and can actually cause a gout flare-- when it is first started, while her body is adjusting.   I will give her a prednisone taper to take right now 77 y.o. while she is starting Uloric.   Hopefully, this will prevent flare.   Will plan follow-up visit with me in 1 month.   However, she is to follow-up with me sooner if she has any redness or pain or swelling in that joint.   She voices understanding and agrees.    08/09/2017: Today patient's left first MTP joint region has some pink erythema and mild pain. She states that the redness started coming back this week starting on this past Monday she started noticing it looking pink again. States that the pink color had improved/resolved but then started coming back this past Monday. (It is  currently Thursday.)  She confirms that she has been taking the Uloric daily.  States that she also took the prednisone as directed.  She has no other specific concerns to address. States that she is going out of town tomorrow to her brothers to celebrate her birthday which is the Saturday.  Says that he has a recliner that he is planning for her to sit in so she can prop her feet.  Says that she will be back in town and able to come back here for follow-up visit next week.   A/P AT THAT OV 08/09/2017: - febuxostat (ULORIC) 40 MG tablet; Take 1 tablet (40 mg total) by mouth daily.  Dispense: 30 tablet; Refill: 5 - predniSONE (DELTASONE) 20 MG tablet; Take 2 daily for 3 days then take 1 daily for 6 days.  Dispense: 12 tablet; Refill: 0  Given CKD, avoid NSAID.  Use Prednisone to control flare.  Aware that prednisone will increase glucose.  Aware of recent new warning with using Uloric in patients with cardiovascualr disease, but her recent Uric Acid level was 10.6. Given her CKD, would have to limit Allopurinol to '50mg'$  QD----This is not goin to control her Gout.  We will have her take prednisone 40 mg daily for 3 days then 20 mg daily for  6 days to try to treat current flare. Have her schedule follow-up visit with me in 1 week to make sure this is resolved.  Follow-up sooner if worsens/if needed.  Also noted that last A1c was 05/10/2017.  Will be due to recheck A1c at next visit.    08/15/2017: Today I reviewed phone notes since last visit.  We received fax from pharmacy that Uloric not covered by insurance.  Allopurinol preferred.  Ultimately decided to switch change to allopurinol 50 mg daily.  Will be one half of a 100 mg tablet daily. This has been back-and-forth phone messages and ultimate dosing etc. decision just recently made and prescription just sent to pharmacy this morning. She states that she has been informed about the allopurinol and knows to take one half of the 100 mg daily ---  50 mg daily. States that her son is going to the [facility] when he gets off work today to pick it up. States that she has been taking the prednisone as directed since last visit.  The redness and pain has completely resolved in her foot. She has no specific concerns to address today.  She is agreeable to recheck A1c to monitor diabetes.     Past Medical History:  Diagnosis Date  . Arthritis    "hands, feet" (02/17/2016)  . CAD (coronary artery disease)    a. 05/2016: cath showing nonobstructive CAD with 50% prox-Cx stenosis  . CKD (chronic kidney disease) stage 3, GFR 30-59 ml/min (HCC) 12/25/2012  . COPD (chronic obstructive pulmonary disease) ([place])   . Diastolic heart failure ([place])   . Hyperlipidemia   . Hypertension   . Osteoporosis   . Pneumonia    "several times" (02/17/2016)  . Small bowel obstruction ([place]) 02/17/2016  . Tricuspid regurgitation    Echo 05/02/06-Nml LV. Mild MR. Mod TR  . Type II diabetes mellitus ([place])   . Vitamin D deficiency      Home Meds: Outpatient Medications Prior to Visit  Medication Sig Dispense Refill  . acetaminophen (TYLENOL) 500 MG tablet Take 500 mg by mouth every 6 (six) hours as needed  for pain.    Marland Kitchen allopurinol (ZYLOPRIM) 100 MG tablet Take  1/2 tablet '50Mg'$  daily. 30 tablet 6  . amLODipine (NORVASC) 10 MG tablet Take 1 tablet (10 mg total) by mouth daily. 90 tablet 3  . Blood Glucose Monitoring Suppl KIT Use to check blood sugar  Three times daily before meals and at bedtime.ICD10: E11.8.Please dispense based on insurance preference 1 each 0  . calcitRIOL (ROCALTROL) 0.25 MCG capsule Take 1 capsule (0.25 mcg total) by mouth every Monday, Wednesday, and Friday. 90 capsule 1  . carvedilol (COREG) 12.5 MG tablet Take 1 tablet (12.5 mg total) by mouth 2 (two) times daily. 180 tablet 3  . Cholecalciferol (VITAMIN D) 2000 UNITS CAPS Take 2,000 Units by mouth daily.     . cloNIDine (CATAPRES) 0.3 MG tablet Take 1 tablet (0.3 mg total) by mouth 2 (two) times daily. 180 tablet 3  . famotidine (PEPCID) 20 MG tablet Take 1 tablet (20 mg total) by mouth at bedtime. For rash for 2 weeks 14 tablet 0  . furosemide (LASIX) 40 MG tablet Take 60 mg alternating with 40 mg daily. 135 tablet 3  . gabapentin (NEURONTIN) 100 MG capsule TAKE 1 TO 2 CAPSULES BY MOUTH AT BEDTIME FOR PERIPHERAL NEUROPATHY. 120 capsule 3  . glucose blood test strip Use as directed. Check blood sugar 3 times daily before meals and at bedtime. 100 each 12  . insulin aspart (NOVOLOG FLEXPEN) 100 UNIT/ML FlexPen INJECT 7 UNITS SUBCUTANEOUSLY WITH LUNCH AND SUPPER. INJECT 15 MINUTES PRIOR TO MEAL OR IMMEDIATELY AFTER MEAL. 15 pen 11  . Insulin Detemir (LEVEMIR FLEXTOUCH) 100 UNIT/ML Pen INJECT 22 UNITS SUBCUTANEOUSLY ONCE DAILY AT  10  PM 15 pen 2  . Insulin Pen Needle (BD PEN NEEDLE MICRO U/F) 32G X 6 MM MISC Use to check blood sugar three times daily ICD10: e11.8 Dispense based on insurance preference 100 each 2  . Lancets (ACCU-CHEK SOFT TOUCH) lancets Use to check blood sugar ICD10:e11.8 Dispense based on insurance preference 100 each 12  . Lancets (ONETOUCH ULTRASOFT) lancets Use as instructed 100 each 12  . linaclotide  (LINZESS) 145 MCG CAPS capsule Take 1 capsule (145 mcg total) by mouth daily before breakfast. 30 capsule 5  . mirabegron ER (MYRBETRIQ) 50 MG TB24 tablet Take 1 tablet (50 mg total) by mouth daily. 30 tablet 5  . omeprazole (PRILOSEC) 20 MG capsule Take 1 capsule (20 mg total) by mouth daily. 90 capsule 3  . predniSONE (DELTASONE) 20 MG tablet Take 2 daily for 3 days then take 1 daily for 6 days. 12 tablet 0  . psyllium (HYDROCIL/METAMUCIL) 95 % PACK Take 1 packet by mouth 3 (three) times daily. 56 each 0  . RELION SHORT PEN NEEDLES 31G X 8 MM MISC USE ONE PEN NEEDLE ONCE DAILY AS DIRECTED 50 each 11  . Rivaroxaban (XARELTO) 15 MG TABS tablet Take 1 tablet (15 mg total) by mouth daily. 30 tablet 6  . simvastatin (ZOCOR) 20 MG tablet TAKE ONE TABLET ('20MG'$ ) BY MOUTH ONCE DAILY AT BEDTIME 90 tablet 3  . febuxostat (ULORIC) 40 MG tablet Take 1 tablet (40 mg total) by mouth daily. 30 tablet 5   No facility-administered medications prior to visit.     Allergies:  Allergies  Allergen Reactions  . Ace Inhibitors Other (See Comments)    Hyperkalemia  . Actos [Pioglitazone] Swelling  . Aspirin Other (See Comments)    G.I. Upset. In high doses  . Eliquis [Apixaban]   . Metformin And Related Diarrhea    Social History   Socioeconomic  History  . Marital status: Widowed    Spouse name: Not on file  . Number of children: Not on file  . Years of education: Not on file  . Highest education level: Not on file  Occupational History  . Occupation: retired  Scientific laboratory technician  . Financial resource strain: Not on file  . Food insecurity:    Worry: Not on file    Inability: Not on file  . Transportation needs:    Medical: Not on file    Non-medical: Not on file  Tobacco Use  . Smoking status: Former Smoker    Packs/day: 0.50    Years: 50.00    Pack years: 25.00    Types: Cigarettes    Last attempt to quit: 01/19/2009    Years since quitting: 8.5  . Smokeless tobacco: Never Used  Substance and  Sexual Activity  . Alcohol use: No    Alcohol/week: 0.0 oz  . Drug use: No  . Sexual activity: Never  Lifestyle  . Physical activity:    Days per week: Not on file    Minutes per session: Not on file  . Stress: Not on file  Relationships  . Social connections:    Talks on phone: Not on file    Gets together: Not on file    Attends religious service: Not on file    Active member of club or organization: Not on file    Attends meetings of clubs or organizations: Not on file    Relationship status: Not on file  . Intimate partner violence:    Fear of current or ex partner: Not on file    Emotionally abused: Not on file    Physically abused: Not on file    Forced sexual activity: Not on file  Other Topics Concern  . Not on file  Social History Narrative   Entered 10/2013:   She has never driven.   She lives with her son.    Family History  Problem Relation Age of Onset  . Cancer Father   . Diabetes Father   . Cancer Brother        Brain  . Cancer Sister        abdominal fat     Review of Systems:  See HPI for pertinent ROS. All other ROS negative.    Physical Exam: Blood pressure 130/62, pulse 74, temperature 98 F (36.7 C), temperature source Oral, resp. rate 14, height '5\' 3"'$  (1.6 m), weight 77.7 kg (171 lb 3.2 oz), SpO2 95 %., There is no height or weight on file to calculate BMI. General: WNWD WF. Appears in no acute distress. Neck: Supple. No thyromegaly. No lymphadenopathy. Lungs: Clear bilaterally to auscultation without wheezes, rales, or rhonchi. Breathing is unlabored. Heart: RRR with S1 S2. No murmurs, rubs, or gallops. Musculoskeletal:  Strength and tone normal for age. Extremities/Skin:  Feet look good. There is no erythema or swelling. Neuro: Alert and oriented X 3. Moves all extremities spontaneously. Gait is normal. CNII-XII grossly in tact. Psych:  Responds to questions appropriately with a normal affect.      ASSESSMENT AND PLAN:  77 y.o. year  old female with    1. Idiopathic gout, unspecified chronicity, unspecified site Given Prior Authorization/ Preferred Medication Per Insurance--- and recent new black-box warning on Uloric-- Will Change Uloric to Allopurinol '50mg'$  QD (1/2 of '100mg'$  QD) Will go ahead and give her another round of prednisone to have on hand/have available to use--- if she does  have any recurrent gout flare.   If she develops recurrent redness/pain/swelling on the foot at prior location of foot then can take prednisone.  Also will need to call and schedule OV here to further evaluate. - predniSONE (DELTASONE) 20 MG tablet; Take 2 daily for 3 days then take 1 daily for 6 days.  Dispense: 12 tablet; Refill: 0  2. Diabetes mellitus with complication ([place]) Aware that the prednisone is causing increase in blood sugar. Last A1c 3 months ago. Due to recheck A1C - Hemoglobin A1c - predniSONE (DELTASONE) 20 MG tablet; Take 2 daily for 3 days then take 1 daily for 6 days.  Dispense: 12 tablet; Refill: 0  3. Type 2 diabetes mellitus with diabetic nephropathy, with long-term current use of insulin ([place])  4. Diabetic peripheral neuropathy associated with type 2 diabetes mellitus ([place])  5. CKD (chronic kidney disease) stage 3, GFR 30-59 ml/min (HCC) Can not use > '50mg'$  Allopurinol.  Can not use NSAID.   Will plan for routine follow-up visit in 3 months.  Follow-up sooner if needed.    Marin Olp [place], [place], [facility] 08/15/2017 2:32 PM

## 2017-08-16 ENCOUNTER — Telehealth: Payer: Self-pay

## 2017-08-16 DIAGNOSIS — M1000 Idiopathic gout, unspecified site: Secondary | ICD-10-CM

## 2017-08-16 DIAGNOSIS — N183 Chronic kidney disease, stage 3 unspecified: Secondary | ICD-10-CM

## 2017-08-16 LAB — HEMOGLOBIN A1C
EAG (MMOL/L): 10.1 (calc)
Hgb A1c MFr Bld: 8 % of total Hgb — ABNORMAL HIGH (ref <5.7)
MEAN PLASMA GLUCOSE: 183 (calc)

## 2017-08-16 MED ORDER — FEBUXOSTAT 40 MG PO TABS: 40.0000 mg | ORAL_TABLET | Freq: Every day | ORAL | 5 refills | Status: DC

## 2017-08-16 NOTE — Telephone Encounter (Signed)
Patient called and stated the pharmacy filled her Uloric 40mg  tablet that they stated needed a prior authorization, The rx was changed to allopurinol which was an alternative the insurance stated they would cover. Patient was informed to take the Uloric since that was the original rx that was written. Patient verbalizes understanding. Patient picked allopurinol up from [facility] stated a PA was needed on Uloric

## 2017-08-28 ENCOUNTER — Telehealth: Payer: Self-pay

## 2017-08-28 NOTE — Telephone Encounter (Signed)
Received fax from [facility] that patient prescription for Uloric 40 mg  had been approved and is good until 08/17/2019

## 2017-09-03 ENCOUNTER — Other Ambulatory Visit: Payer: Self-pay | Admitting: Physician Assistant

## 2017-09-13 ENCOUNTER — Other Ambulatory Visit: Payer: Self-pay

## 2017-09-13 MED ORDER — RIVAROXABAN 15 MG PO TABS: 15.0000 mg | ORAL_TABLET | Freq: Every day | ORAL | 6 refills | Status: DC

## 2017-09-18 ENCOUNTER — Other Ambulatory Visit: Payer: Self-pay

## 2017-09-18 DIAGNOSIS — N183 Chronic kidney disease, stage 3 unspecified: Secondary | ICD-10-CM

## 2017-09-18 DIAGNOSIS — M1000 Idiopathic gout, unspecified site: Secondary | ICD-10-CM

## 2017-09-18 MED ORDER — OMEPRAZOLE 20 MG PO CPDR: 20.0000 mg | DELAYED_RELEASE_CAPSULE | Freq: Every day | ORAL | 3 refills | Status: DC

## 2017-09-18 MED ORDER — FEBUXOSTAT 40 MG PO TABS: 40.0000 mg | ORAL_TABLET | Freq: Every day | ORAL | 0 refills | Status: DC

## 2017-09-19 ENCOUNTER — Other Ambulatory Visit: Payer: Self-pay

## 2017-09-19 MED ORDER — RIVAROXABAN 15 MG PO TABS: 15.0000 mg | ORAL_TABLET | Freq: Every day | ORAL | 6 refills | Status: DC

## 2017-10-10 ENCOUNTER — Telehealth: Payer: Self-pay

## 2017-10-10 NOTE — Telephone Encounter (Signed)
I received a fax from [facility] equipment stating the patient had requested a Freestyle Libre Continuous Glucose Monitor. I then called patient to verify that she had requested the meter, Patient told me she did not request the meter and that the company called her asking if she wanted the meter and she told them no. I will deny the request for the Glucose meter. Patient is aware

## 2017-10-12 ENCOUNTER — Encounter: Payer: Self-pay | Admitting: Cardiology

## 2017-10-12 ENCOUNTER — Ambulatory Visit: Payer: Medicare HMO | Admitting: Cardiology

## 2017-10-12 VITALS — BP 130/60 | HR 78 | Ht 63.0 in | Wt 178.0 lb

## 2017-10-12 DIAGNOSIS — I272 Pulmonary hypertension, unspecified: Secondary | ICD-10-CM

## 2017-10-12 DIAGNOSIS — I10 Essential (primary) hypertension: Secondary | ICD-10-CM | POA: Diagnosis not present

## 2017-10-12 DIAGNOSIS — E782 Mixed hyperlipidemia: Secondary | ICD-10-CM | POA: Diagnosis not present

## 2017-10-12 DIAGNOSIS — I5032 Chronic diastolic (congestive) heart failure: Secondary | ICD-10-CM

## 2017-10-12 NOTE — Patient Instructions (Signed)
Your physician recommends that you schedule a follow-up appointment in: [place] has recommended you make the following change in your medication:   INCREASE LASIX 60 MG DAILY FOR 4 DAYS THEN RESUME PREVIOUS DOSE   Your physician recommends that you return for lab work in: 1 WEEK BMP/MG  Thank you for choosing [facility]!!

## 2017-10-12 NOTE — Progress Notes (Signed)
Clinical Summary Laura Best is a 77 y.o.female seen today for follow up of the following medical problems.  1.Chronic diastolic heart failure - can have some SOB that is stable. Some recent edema - home weights variable. This morning 175 lbs. Previous baseline around 170.     2. Afib - new diagnosis during 12/2016 admission.   - no recent palpitations - no bleeding on xarelto.   3. HTN - Cr 2.07 BUN 77 GFR 23, from notes there is a question of possible right renal artery stenosis.  - compliant with meds   4. Pulmonary venous HTN - RHC as reported below.  - some recent LE edema  5. History of ASD repair - no evidence of shunt by prior cath  6. CKDIV - followed by nephrology   7. Hyperlipidemia - 08/2016: TC 162 TG 208 HDL 64 LDL 56 - remains compliant with meds   Past Medical History:  Diagnosis Date  . Arthritis    "hands, feet" (02/17/2016)  . CAD (coronary artery disease)    a. 05/2016: cath showing nonobstructive CAD with 50% prox-Cx stenosis  . CKD (chronic kidney disease) stage 3, GFR 30-59 ml/min (HCC) 12/25/2012  . COPD (chronic obstructive pulmonary disease) ([place])   . Diastolic heart failure ([place])   . Hyperlipidemia   . Hypertension   . Osteoporosis   . Pneumonia    "several times" (02/17/2016)  . Small bowel obstruction ([place]) 02/17/2016  . Tricuspid regurgitation    Echo 05/02/06-Nml LV. Mild MR. Mod TR  . Type II diabetes mellitus ([place])   . Vitamin D deficiency      Allergies  Allergen Reactions  . Ace Inhibitors Other (See Comments)    Hyperkalemia  . Actos [Pioglitazone] Swelling  . Aspirin Other (See Comments)    G.I. Upset. In high doses  . Eliquis [Apixaban]   . Metformin And Related Diarrhea     Current Outpatient Medications  Medication Sig Dispense Refill  . acetaminophen (TYLENOL) 500 MG tablet Take 500 mg by mouth every 6 (six) hours as needed for pain.    Marland Kitchen allopurinol (ZYLOPRIM) 100 MG tablet Take 1/2 tablet  '50Mg'$  daily. 30 tablet 6  . amLODipine (NORVASC) 10 MG tablet Take 1 tablet (10 mg total) by mouth daily. 90 tablet 3  . BD PEN NEEDLE MICRO U/F 32G X 6 MM MISC USE AS DIRECTED THREE TIMES DAILY 300 each 2  . Blood Glucose Monitoring Suppl (ACCU-CHEK GUIDE) w/Device KIT USE AS DIRECTED 1 kit 0  . Blood Glucose Monitoring Suppl KIT Use to check blood sugar  Three times daily before meals and at bedtime.ICD10: E11.8.Please dispense based on insurance preference 1 each 0  . calcitRIOL (ROCALTROL) 0.25 MCG capsule Take 1 capsule (0.25 mcg total) by mouth every Monday, Wednesday, and Friday. 90 capsule 1  . carvedilol (COREG) 12.5 MG tablet Take 1 tablet (12.5 mg total) by mouth 2 (two) times daily. 180 tablet 3  . Cholecalciferol (VITAMIN D) 2000 UNITS CAPS Take 2,000 Units by mouth daily.     . cloNIDine (CATAPRES) 0.3 MG tablet Take 1 tablet (0.3 mg total) by mouth 2 (two) times daily. 180 tablet 3  . famotidine (PEPCID) 20 MG tablet Take 1 tablet (20 mg total) by mouth at bedtime. For rash for 2 weeks 14 tablet 0  . febuxostat (ULORIC) 40 MG tablet Take 1 tablet (40 mg total) by mouth daily. 90 tablet 0  . furosemide (LASIX) 40 MG tablet Take 60  mg alternating with 40 mg daily. 135 tablet 3  . gabapentin (NEURONTIN) 100 MG capsule TAKE 1 TO 2 CAPSULES BY MOUTH AT BEDTIME FOR PERIPHERAL NEUROPATHY. 120 capsule 3  . glucose blood test strip Use as directed. Check blood sugar 3 times daily before meals and at bedtime. 100 each 12  . insulin aspart (NOVOLOG FLEXPEN) 100 UNIT/ML FlexPen INJECT 7 UNITS SUBCUTANEOUSLY WITH LUNCH AND SUPPER. INJECT 15 MINUTES PRIOR TO MEAL OR IMMEDIATELY AFTER MEAL. 15 pen 11  . Insulin Detemir (LEVEMIR FLEXTOUCH) 100 UNIT/ML Pen INJECT 22 UNITS SUBCUTANEOUSLY ONCE DAILY AT  10  PM 15 pen 2  . Lancets (ACCU-CHEK SOFT TOUCH) lancets Use to check blood sugar ICD10:e11.8 Dispense based on insurance preference 100 each 12  . Lancets (ONETOUCH ULTRASOFT) lancets Use as instructed  100 each 12  . linaclotide (LINZESS) 145 MCG CAPS capsule Take 1 capsule (145 mcg total) by mouth daily before breakfast. 30 capsule 5  . mirabegron ER (MYRBETRIQ) 50 MG TB24 tablet Take 1 tablet (50 mg total) by mouth daily. 30 tablet 5  . omeprazole (PRILOSEC) 20 MG capsule Take 1 capsule (20 mg total) by mouth daily. 90 capsule 3  . predniSONE (DELTASONE) 20 MG tablet Take 2 daily for 3 days then take 1 daily for 6 days. 12 tablet 0  . psyllium (HYDROCIL/METAMUCIL) 95 % PACK Take 1 packet by mouth 3 (three) times daily. 56 each 0  . RELION SHORT PEN NEEDLES 31G X 8 MM MISC USE ONE PEN NEEDLE ONCE DAILY AS DIRECTED 50 each 11  . Rivaroxaban (XARELTO) 15 MG TABS tablet Take 1 tablet (15 mg total) by mouth daily. 30 tablet 6  . simvastatin (ZOCOR) 20 MG tablet TAKE ONE TABLET ('20MG'$ ) BY MOUTH ONCE DAILY AT BEDTIME 90 tablet 3   No current facility-administered medications for this visit.      Past Surgical History:  Procedure Laterality Date  . APPENDECTOMY    . HEART CHAMBER REVISION     patched hole --[place]  . LAPAROSCOPIC APPENDECTOMY N/A 09/27/2013   Procedure: APPENDECTOMY - CONVERTED FROM LAPAROSCOPIC;  Surgeon: Donnie Mesa, MD;  Location: [facility];  Service: General;  Laterality: N/A;  . RIGHT/LEFT HEART CATH AND CORONARY ANGIOGRAPHY N/A 05/24/2016   Procedure: Right/Left Heart Cath and Coronary Angiography;  Surgeon: Larey Dresser, MD;  Location: [facility];  Service: Cardiovascular;  Laterality: N/A;  . TUBAL LIGATION       Allergies  Allergen Reactions  . Ace Inhibitors Other (See Comments)    Hyperkalemia  . Actos [Pioglitazone] Swelling  . Aspirin Other (See Comments)    G.I. Upset. In high doses  . Eliquis [Apixaban]   . Metformin And Related Diarrhea      Family History  Problem Relation Age of Onset  . Cancer Father   . Diabetes Father   . Cancer Brother        Brain  . Cancer Sister        abdominal fat     Social History Laura Best reports  that she quit smoking about 8 years ago. Her smoking use included cigarettes. She has a 25.00 pack-year smoking history. She has never used smokeless tobacco. Laura Best reports that she does not drink alcohol.   Review of Systems CONSTITUTIONAL: No weight loss, fever, chills, weakness or fatigue.  HEENT: Eyes: No visual loss, blurred vision, double vision or yellow sclerae.No hearing loss, sneezing, congestion, runny nose or sore throat.  SKIN: No  rash or itching.  CARDIOVASCULAR: per hpi RESPIRATORY: per hpi GASTROINTESTINAL: No anorexia, nausea, vomiting or diarrhea. No abdominal pain or blood.  GENITOURINARY: No burning on urination, no polyuria NEUROLOGICAL: No headache, dizziness, syncope, paralysis, ataxia, numbness or tingling in the extremities. No change in bowel or bladder control.  MUSCULOSKELETAL: No muscle, back pain, joint pain or stiffness.  LYMPHATICS: No enlarged nodes. No history of splenectomy.  PSYCHIATRIC: No history of depression or anxiety.  ENDOCRINOLOGIC: No reports of sweating, cold or heat intolerance. No polyuria or polydipsia.  Marland Kitchen   Physical Examination Vitals:   10/12/17 1446  BP: 130/60  Pulse: 78  SpO2: 94%   Vitals:   10/12/17 1446  Weight: 178 lb (80.7 kg)  Height: '5\' 3"'$  (1.6 m)    Gen: resting comfortably, no acute distress HEENT: no scleral icterus, pupils equal round and reactive, no palptable cervical adenopathy,  CV: irreg, no m/r/g, no jvd Resp: Clear to auscultation bilaterally GI: abdomen is soft, non-tender, non-distended, normal bowel sounds, no hepatosplenomegaly MSK: extremities are warm, 1+ bilateral LE edema Skin: warm, no rash Neuro:  no focal deficits Psych: appropriate affect   Diagnostic Studies  12/2012 Echo LVEF 55%, mild LVH, grade II diastolic dysfunction,   3/73/57 EKG: sinus bradycardia rate 59  05/2016 LHC/RHC 1. Near-normal filling pressures.  2. No oxygen saturation step-up between RA and PA to suggest  residual ASD.  3. Mild pulmonary hypertension with low PVR, suggests pulmonary venous hypertension.  4. Nonobstructive CAD, most significant disease being serial 50% stenoses in the proximal to mid LCx.   Would recommend aspirin and statin treatment for CAD.   She is on an adequate dose of Lasix, would not change.   Hydration post-procedure given CKD.  03/2016 echo Study Conclusions  - Left ventricle: The cavity size was normal. There was moderate concentric hypertrophy. Systolic function was normal. The estimated ejection fraction was in the range of 55% to 60%. Wall motion was normal; there were no regional wall motion abnormalities. Features are consistent with a pseudonormal left ventricular filling pattern, with concomitant abnormal relaxation and increased filling pressure (grade 2 diastolic dysfunction). - Mitral valve: Calcified annulus. There was trivial regurgitation. - Left atrium: The atrium was moderately dilated. - Right ventricle: The cavity size was moderately dilated. Wall thickness was normal. Systolic function was mildly to moderately reduced. - Pulmonary arteries: PA peak pressure: 49 mm Hg (S).  Impressions:  - The right ventricular systolic pressure was increased consistent with moderate pulmonary hypertension.   Assessment and Plan  1. Chronic diastolic heart failure - recent weight gain (baseline weight around 170, up to 175 lbs by home scale) along with increased SOB and LE edema - increase lasix to '60mg'$  daily x 4 days, then back to '60mg'$  alternating with 40. Check BMET in 1 week  2. HTN -at goal, continue current meds  3. Pulmonary venous HTN - increase diuretics as reported above   4. Hyperlipidemia - continue statin   F/u 3 months      Arnoldo Lenis, M.D

## 2017-10-19 ENCOUNTER — Telehealth: Payer: Self-pay | Admitting: Cardiology

## 2017-10-19 NOTE — Telephone Encounter (Signed)
Pt has been sick and she's not able to get her labs done today, will go on Monday

## 2017-10-19 NOTE — Telephone Encounter (Signed)
Noted  

## 2017-10-21 ENCOUNTER — Emergency Department (HOSPITAL_COMMUNITY): Payer: Medicare HMO

## 2017-10-21 ENCOUNTER — Encounter (HOSPITAL_COMMUNITY): Payer: Self-pay | Admitting: Emergency Medicine

## 2017-10-21 ENCOUNTER — Other Ambulatory Visit: Payer: Self-pay

## 2017-10-21 ENCOUNTER — Inpatient Hospital Stay (HOSPITAL_COMMUNITY)
Admission: EM | Admit: 2017-10-21 | Discharge: 2017-10-24 | DRG: 291 | Disposition: A | Discharge status: Home Health | Payer: Medicare HMO | Attending: Internal Medicine | Admitting: Internal Medicine

## 2017-10-21 DIAGNOSIS — J209 Acute bronchitis, unspecified: Secondary | ICD-10-CM | POA: Diagnosis not present

## 2017-10-21 DIAGNOSIS — E1121 Type 2 diabetes mellitus with diabetic nephropathy: Secondary | ICD-10-CM

## 2017-10-21 DIAGNOSIS — Z87891 Personal history of nicotine dependence: Secondary | ICD-10-CM

## 2017-10-21 DIAGNOSIS — E11649 Type 2 diabetes mellitus with hypoglycemia without coma: Secondary | ICD-10-CM | POA: Diagnosis not present

## 2017-10-21 DIAGNOSIS — M810 Age-related osteoporosis without current pathological fracture: Secondary | ICD-10-CM | POA: Diagnosis present

## 2017-10-21 DIAGNOSIS — I481 Persistent atrial fibrillation: Secondary | ICD-10-CM

## 2017-10-21 DIAGNOSIS — M109 Gout, unspecified: Secondary | ICD-10-CM | POA: Diagnosis present

## 2017-10-21 DIAGNOSIS — I482 Chronic atrial fibrillation, unspecified: Secondary | ICD-10-CM

## 2017-10-21 DIAGNOSIS — J9621 Acute and chronic respiratory failure with hypoxia: Secondary | ICD-10-CM | POA: Diagnosis not present

## 2017-10-21 DIAGNOSIS — J40 Bronchitis, not specified as acute or chronic: Secondary | ICD-10-CM

## 2017-10-21 DIAGNOSIS — I5033 Acute on chronic diastolic (congestive) heart failure: Secondary | ICD-10-CM | POA: Diagnosis not present

## 2017-10-21 DIAGNOSIS — Z23 Encounter for immunization: Secondary | ICD-10-CM

## 2017-10-21 DIAGNOSIS — I4819 Other persistent atrial fibrillation: Secondary | ICD-10-CM | POA: Diagnosis present

## 2017-10-21 DIAGNOSIS — E1142 Type 2 diabetes mellitus with diabetic polyneuropathy: Secondary | ICD-10-CM

## 2017-10-21 DIAGNOSIS — N183 Chronic kidney disease, stage 3 unspecified: Secondary | ICD-10-CM | POA: Diagnosis present

## 2017-10-21 DIAGNOSIS — E785 Hyperlipidemia, unspecified: Secondary | ICD-10-CM | POA: Diagnosis present

## 2017-10-21 DIAGNOSIS — J9601 Acute respiratory failure with hypoxia: Secondary | ICD-10-CM

## 2017-10-21 DIAGNOSIS — Z794 Long term (current) use of insulin: Secondary | ICD-10-CM | POA: Diagnosis not present

## 2017-10-21 DIAGNOSIS — I10 Essential (primary) hypertension: Secondary | ICD-10-CM | POA: Diagnosis not present

## 2017-10-21 DIAGNOSIS — R0989 Other specified symptoms and signs involving the circulatory and respiratory systems: Secondary | ICD-10-CM

## 2017-10-21 DIAGNOSIS — J9620 Acute and chronic respiratory failure, unspecified whether with hypoxia or hypercapnia: Secondary | ICD-10-CM | POA: Diagnosis present

## 2017-10-21 DIAGNOSIS — Z66 Do not resuscitate: Secondary | ICD-10-CM | POA: Diagnosis not present

## 2017-10-21 DIAGNOSIS — Z888 Allergy status to other drugs, medicaments and biological substances status: Secondary | ICD-10-CM

## 2017-10-21 DIAGNOSIS — E1165 Type 2 diabetes mellitus with hyperglycemia: Secondary | ICD-10-CM

## 2017-10-21 DIAGNOSIS — I2783 Eisenmenger's syndrome: Secondary | ICD-10-CM | POA: Diagnosis not present

## 2017-10-21 DIAGNOSIS — J441 Chronic obstructive pulmonary disease with (acute) exacerbation: Secondary | ICD-10-CM | POA: Diagnosis present

## 2017-10-21 DIAGNOSIS — R0902 Hypoxemia: Secondary | ICD-10-CM

## 2017-10-21 DIAGNOSIS — K219 Gastro-esophageal reflux disease without esophagitis: Secondary | ICD-10-CM | POA: Diagnosis present

## 2017-10-21 DIAGNOSIS — Z7901 Long term (current) use of anticoagulants: Secondary | ICD-10-CM

## 2017-10-21 DIAGNOSIS — I509 Heart failure, unspecified: Secondary | ICD-10-CM | POA: Diagnosis not present

## 2017-10-21 DIAGNOSIS — E1122 Type 2 diabetes mellitus with diabetic chronic kidney disease: Secondary | ICD-10-CM | POA: Diagnosis present

## 2017-10-21 DIAGNOSIS — I130 Hypertensive heart and chronic kidney disease with heart failure and stage 1 through stage 4 chronic kidney disease, or unspecified chronic kidney disease: Secondary | ICD-10-CM | POA: Diagnosis not present

## 2017-10-21 DIAGNOSIS — A4189 Other specified sepsis: Secondary | ICD-10-CM | POA: Diagnosis not present

## 2017-10-21 DIAGNOSIS — R05 Cough: Secondary | ICD-10-CM | POA: Diagnosis not present

## 2017-10-21 DIAGNOSIS — I2510 Atherosclerotic heart disease of native coronary artery without angina pectoris: Secondary | ICD-10-CM | POA: Diagnosis present

## 2017-10-21 DIAGNOSIS — I5031 Acute diastolic (congestive) heart failure: Secondary | ICD-10-CM | POA: Diagnosis not present

## 2017-10-21 DIAGNOSIS — J449 Chronic obstructive pulmonary disease, unspecified: Secondary | ICD-10-CM | POA: Diagnosis not present

## 2017-10-21 DIAGNOSIS — R0602 Shortness of breath: Secondary | ICD-10-CM | POA: Diagnosis not present

## 2017-10-21 DIAGNOSIS — K210 Gastro-esophageal reflux disease with esophagitis: Secondary | ICD-10-CM | POA: Diagnosis not present

## 2017-10-21 LAB — COMPREHENSIVE METABOLIC PANEL
ALK PHOS: 97 U/L (ref 38–126)
ALT: 16 U/L (ref 0–44)
AST: 16 U/L (ref 15–41)
Albumin: 4 g/dL (ref 3.5–5.0)
Anion gap: 12 (ref 5–15)
BILIRUBIN TOTAL: 0.9 mg/dL (ref 0.3–1.2)
BUN: 22 mg/dL (ref 8–23)
CALCIUM: 9.1 mg/dL (ref 8.9–10.3)
CO2: 24 mmol/L (ref 22–32)
CREATININE: 1.57 mg/dL — AB (ref 0.44–1.00)
Chloride: 101 mmol/L (ref 98–111)
GFR calc Af Amer: 36 mL/min — ABNORMAL LOW (ref >60)
GFR calc non Af Amer: 31 mL/min — ABNORMAL LOW (ref >60)
GLUCOSE: 212 mg/dL — AB (ref 70–99)
Potassium: 3.8 mmol/L (ref 3.5–5.1)
Sodium: 137 mmol/L (ref 135–145)
Total Protein: 7.9 g/dL (ref 6.5–8.1)

## 2017-10-21 LAB — CBC WITH DIFFERENTIAL/PLATELET
Basophils Absolute: 0.1 10*3/uL (ref 0.0–0.1)
Basophils Relative: 1 %
Eosinophils Absolute: 0.2 10*3/uL (ref 0.0–0.7)
Eosinophils Relative: 3 %
HEMATOCRIT: 43.4 % (ref 36.0–46.0)
HEMOGLOBIN: 14.1 g/dL (ref 12.0–15.0)
LYMPHS ABS: 1.1 10*3/uL (ref 0.7–4.0)
Lymphocytes Relative: 17 %
MCH: 28.3 pg (ref 26.0–34.0)
MCHC: 32.5 g/dL (ref 30.0–36.0)
MCV: 87 fL (ref 78.0–100.0)
MONOS PCT: 13 %
Monocytes Absolute: 0.9 10*3/uL (ref 0.1–1.0)
NEUTROS ABS: 4.3 10*3/uL (ref 1.7–7.7)
NEUTROS PCT: 66 %
Platelets: 202 10*3/uL (ref 150–400)
RBC: 4.99 MIL/uL (ref 3.87–5.11)
RDW: 16 % — ABNORMAL HIGH (ref 11.5–15.5)
WBC: 6.6 10*3/uL (ref 4.0–10.5)

## 2017-10-21 LAB — BRAIN NATRIURETIC PEPTIDE: B Natriuretic Peptide: 398 pg/mL — ABNORMAL HIGH (ref 0.0–100.0)

## 2017-10-21 LAB — TROPONIN I: Troponin I: <0.03 ng/mL (ref <0.03)

## 2017-10-21 LAB — MAGNESIUM: Magnesium: 2.1 mg/dL (ref 1.7–2.4)

## 2017-10-21 LAB — I-STAT CG4 LACTIC ACID, ED: Lactic Acid, Venous: 1.52 mmol/L (ref 0.5–1.9)

## 2017-10-21 LAB — GLUCOSE, CAPILLARY: Glucose-Capillary: 247 mg/dL — ABNORMAL HIGH (ref 70–99)

## 2017-10-21 MED ORDER — INFLUENZA VAC SPLIT HIGH-DOSE 0.5 ML IM SUSY
0.5000 mL | PREFILLED_SYRINGE | INTRAMUSCULAR | Status: AC
  Administered 2017-10-23: 0.5 mL via INTRAMUSCULAR
  Filled 2017-10-21: qty 0.5

## 2017-10-21 MED ORDER — POTASSIUM CHLORIDE CRYS ER 20 MEQ PO TBCR
40.0000 meq | EXTENDED_RELEASE_TABLET | Freq: Two times a day (BID) | ORAL | Status: DC
  Administered 2017-10-21: 40 meq via ORAL
  Filled 2017-10-21 (×2): qty 2

## 2017-10-21 MED ORDER — ALBUTEROL SULFATE (2.5 MG/3ML) 0.083% IN NEBU
5.0000 mg | INHALATION_SOLUTION | Freq: Once | RESPIRATORY_TRACT | Status: AC
  Administered 2017-10-21: 5 mg via RESPIRATORY_TRACT
  Filled 2017-10-21: qty 6

## 2017-10-21 MED ORDER — ONDANSETRON HCL 4 MG/2ML IJ SOLN
4.0000 mg | Freq: Once | INTRAMUSCULAR | Status: AC
  Administered 2017-10-21: 4 mg via INTRAVENOUS
  Filled 2017-10-21: qty 2

## 2017-10-21 MED ORDER — FEBUXOSTAT 40 MG PO TABS
40.0000 mg | ORAL_TABLET | Freq: Every day | ORAL | Status: DC
  Administered 2017-10-22 – 2017-10-24 (×3): 40 mg via ORAL
  Filled 2017-10-21 (×3): qty 1

## 2017-10-21 MED ORDER — CARVEDILOL 12.5 MG PO TABS
12.5000 mg | ORAL_TABLET | Freq: Two times a day (BID) | ORAL | Status: DC
  Administered 2017-10-21 – 2017-10-24 (×6): 12.5 mg via ORAL
  Filled 2017-10-21 (×6): qty 1

## 2017-10-21 MED ORDER — INSULIN ASPART 100 UNIT/ML ~~LOC~~ SOLN
0.0000 [IU] | Freq: Three times a day (TID) | SUBCUTANEOUS | Status: DC
  Administered 2017-10-22 (×2): 15 [IU] via SUBCUTANEOUS
  Administered 2017-10-22: 7 [IU] via SUBCUTANEOUS
  Administered 2017-10-23: 4 [IU] via SUBCUTANEOUS
  Administered 2017-10-23: 11 [IU] via SUBCUTANEOUS
  Administered 2017-10-24: 7 [IU] via SUBCUTANEOUS
  Administered 2017-10-24: 4 [IU] via SUBCUTANEOUS
  Administered 2017-10-24: 3 [IU] via SUBCUTANEOUS

## 2017-10-21 MED ORDER — FUROSEMIDE 10 MG/ML IJ SOLN
60.0000 mg | Freq: Two times a day (BID) | INTRAMUSCULAR | Status: DC
  Administered 2017-10-22 – 2017-10-23 (×3): 60 mg via INTRAVENOUS
  Filled 2017-10-21 (×3): qty 6

## 2017-10-21 MED ORDER — ONDANSETRON HCL 4 MG/2ML IJ SOLN
4.0000 mg | Freq: Four times a day (QID) | INTRAMUSCULAR | Status: DC | PRN
  Administered 2017-10-21: 4 mg via INTRAVENOUS
  Filled 2017-10-21: qty 2

## 2017-10-21 MED ORDER — INSULIN ASPART 100 UNIT/ML ~~LOC~~ SOLN
0.0000 [IU] | Freq: Every day | SUBCUTANEOUS | Status: DC
  Administered 2017-10-21: 2 [IU] via SUBCUTANEOUS
  Administered 2017-10-22: 3 [IU] via SUBCUTANEOUS

## 2017-10-21 MED ORDER — CLONIDINE HCL 0.2 MG PO TABS
0.3000 mg | ORAL_TABLET | Freq: Two times a day (BID) | ORAL | Status: DC
  Administered 2017-10-21 – 2017-10-24 (×6): 0.3 mg via ORAL
  Filled 2017-10-21 (×6): qty 1

## 2017-10-21 MED ORDER — AMOXICILLIN-POT CLAVULANATE 875-125 MG PO TABS
1.0000 | ORAL_TABLET | Freq: Once | ORAL | Status: AC
  Administered 2017-10-21: 1 via ORAL
  Filled 2017-10-21: qty 1

## 2017-10-21 MED ORDER — METHYLPREDNISOLONE SODIUM SUCC 125 MG IJ SOLR
60.0000 mg | Freq: Four times a day (QID) | INTRAMUSCULAR | Status: DC
  Administered 2017-10-21 – 2017-10-22 (×2): 60 mg via INTRAVENOUS
  Filled 2017-10-21 (×2): qty 2

## 2017-10-21 MED ORDER — IPRATROPIUM-ALBUTEROL 0.5-2.5 (3) MG/3ML IN SOLN
3.0000 mL | Freq: Four times a day (QID) | RESPIRATORY_TRACT | Status: DC
  Administered 2017-10-21 – 2017-10-24 (×10): 3 mL via RESPIRATORY_TRACT
  Filled 2017-10-21 (×9): qty 3

## 2017-10-21 MED ORDER — SIMVASTATIN 20 MG PO TABS
20.0000 mg | ORAL_TABLET | Freq: Every day | ORAL | Status: DC
  Administered 2017-10-21 – 2017-10-23 (×3): 20 mg via ORAL
  Filled 2017-10-21 (×3): qty 1

## 2017-10-21 MED ORDER — SODIUM CHLORIDE 0.9% FLUSH
3.0000 mL | Freq: Two times a day (BID) | INTRAVENOUS | Status: DC
  Administered 2017-10-21 – 2017-10-24 (×5): 3 mL via INTRAVENOUS

## 2017-10-21 MED ORDER — SODIUM CHLORIDE 0.9 % IV SOLN
1.0000 g | INTRAVENOUS | Status: DC
  Administered 2017-10-21: 1 g via INTRAVENOUS
  Filled 2017-10-21 (×3): qty 10

## 2017-10-21 MED ORDER — PREDNISONE 20 MG PO TABS
40.0000 mg | ORAL_TABLET | Freq: Every day | ORAL | Status: DC
  Filled 2017-10-21: qty 2

## 2017-10-21 MED ORDER — SODIUM CHLORIDE 0.9 % IV SOLN: 250.0000 mL | INTRAVENOUS | Status: DC | PRN

## 2017-10-21 MED ORDER — PREDNISONE 50 MG PO TABS
60.0000 mg | ORAL_TABLET | Freq: Once | ORAL | Status: AC
  Administered 2017-10-21: 60 mg via ORAL
  Filled 2017-10-21: qty 1

## 2017-10-21 MED ORDER — SODIUM CHLORIDE 0.9% FLUSH: 3.0000 mL | INTRAVENOUS | Status: DC | PRN

## 2017-10-21 MED ORDER — RIVAROXABAN 15 MG PO TABS
15.0000 mg | ORAL_TABLET | Freq: Every morning | ORAL | Status: DC
  Administered 2017-10-22 – 2017-10-24 (×3): 15 mg via ORAL
  Filled 2017-10-21 (×3): qty 1

## 2017-10-21 MED ORDER — FUROSEMIDE 10 MG/ML IJ SOLN
40.0000 mg | Freq: Once | INTRAMUSCULAR | Status: AC
  Administered 2017-10-21: 40 mg via INTRAVENOUS
  Filled 2017-10-21: qty 4

## 2017-10-21 MED ORDER — AMLODIPINE BESYLATE 5 MG PO TABS
10.0000 mg | ORAL_TABLET | Freq: Every day | ORAL | Status: DC
  Filled 2017-10-21: qty 2

## 2017-10-21 MED ORDER — INSULIN DETEMIR 100 UNIT/ML ~~LOC~~ SOLN
22.0000 [IU] | Freq: Every day | SUBCUTANEOUS | Status: DC
  Administered 2017-10-21 – 2017-10-22 (×2): 22 [IU] via SUBCUTANEOUS
  Filled 2017-10-21 (×3): qty 0.22

## 2017-10-21 MED ORDER — PANTOPRAZOLE SODIUM 40 MG PO TBEC
40.0000 mg | DELAYED_RELEASE_TABLET | Freq: Every day | ORAL | Status: DC
  Administered 2017-10-22 – 2017-10-24 (×3): 40 mg via ORAL
  Filled 2017-10-21 (×3): qty 1

## 2017-10-21 MED ORDER — CALCITRIOL 0.25 MCG PO CAPS
0.2500 ug | ORAL_CAPSULE | ORAL | Status: DC
  Administered 2017-10-22 – 2017-10-24 (×2): 0.25 ug via ORAL
  Filled 2017-10-21 (×3): qty 1

## 2017-10-21 MED ORDER — ALBUTEROL SULFATE (2.5 MG/3ML) 0.083% IN NEBU: 2.5000 mg | INHALATION_SOLUTION | RESPIRATORY_TRACT | Status: DC | PRN

## 2017-10-21 MED ORDER — INSULIN ASPART 100 UNIT/ML ~~LOC~~ SOLN: 7.0000 [IU] | Freq: Two times a day (BID) | SUBCUTANEOUS | Status: DC

## 2017-10-21 MED ORDER — ACETAMINOPHEN 325 MG PO TABS: 650.0000 mg | ORAL_TABLET | ORAL | Status: DC | PRN

## 2017-10-21 NOTE — ED Notes (Signed)
Pt is very short winded with any movement. Pt assist off the bedpan and repositioned in the bed becoming nauseous. RN aware.

## 2017-10-21 NOTE — ED Triage Notes (Addendum)
Patient c/o shortness of breat with congested, productive cough. Patient states thick green sputum. Patient unsure of any fevers but states chills and sweats. Denies any chest pain. Patient does report swelling in legs bilaterally. Hx of CHF. Audible wheezing noted.

## 2017-10-21 NOTE — ED Provider Notes (Signed)
[facility] EMERGENCY DEPARTMENT Provider Note   CSN: [id number] Arrival date & time: 10/21/17  1359     History   Chief Complaint Chief Complaint  Patient presents with  . Shortness of Breath    HPI Laura Best is a 77 y.o. female.  HPI   She presents for evaluation of cough with shortness of breath for several days.  She is producing yellow sputum.  She is taking her usual medicines including diuretics without relief.  She denies chest pain.  Level 5 caveat, high acuity.  Past Medical History:  Diagnosis Date  . Arthritis    "hands, feet" (02/17/2016)  . CAD (coronary artery disease)    a. 05/2016: cath showing nonobstructive CAD with 50% prox-Cx stenosis  . CKD (chronic kidney disease) stage 3, GFR 30-59 ml/min (HCC) 12/25/2012  . COPD (chronic obstructive pulmonary disease) ([place])   . Diastolic heart failure ([place])   . Hyperlipidemia   . Hypertension   . Osteoporosis   . Pneumonia    "several times" (02/17/2016)  . Small bowel obstruction ([place]) 02/17/2016  . Tricuspid regurgitation    Echo 05/02/06-Nml LV. Mild MR. Mod TR  . Type II diabetes mellitus ([place])   . Vitamin D deficiency     Patient Active Problem List   Diagnosis Date Noted  . Persistent atrial fibrillation ([place])   . Acute on chronic respiratory failure with hypoxia ([place]) 01/13/2017  . Diabetic peripheral neuropathy associated with type 2 diabetes mellitus ([place]) 05/11/2016  . PAH (pulmonary artery hypertension) ([place])   . History of atrial septal defect repair   . Leukocytosis 04/06/2016  . Diastolic CHF, acute on chronic (HCC) 04/05/2016  . SBO (small bowel obstruction) ([place]) 02/17/2016  . Chronic diastolic congestive heart failure ([place]) 02/17/2016  . Diabetes mellitus with complication ([place]) 00/93/8182  . Pyuria 02/17/2016  . GERD (gastroesophageal reflux disease) 04/05/2015  . Acute diverticulitis 05/03/2014  . Acute appendicitis with peritoneal abscess 09/27/2013  . Bradycardia 04/02/2013  .  Hyperkalemia 01/22/2013  . Anemia of chronic disease 01/21/2013  . Acute bronchitis 01/20/2013  . CKD (chronic kidney disease) stage 3, GFR 30-59 ml/min (HCC) 12/25/2012  . Screening for colorectal cancer 12/25/2012  . Breast cancer screening 12/25/2012  . Vitamin D deficiency   . Osteoporosis   . Postop check 08/13/2012  . Appendiceal abscess 07/30/2012  . Fever, unspecified 07/19/2012  . Abdominal pain 07/19/2012  . SOB (shortness of breath) 07/19/2012  . ARF (acute renal failure) ([place]) 07/19/2012  . Abscess, appendix 07/19/2012  . Type 2 diabetes mellitus with renal manifestations ([place]) 02/02/2011  . Pulmonary hypertension ([place]) 02/02/2011  . COPD exacerbation ([place]) 02/02/2011  . Hypokalemia 02/02/2011  . Normocytic anemia 02/02/2011  . Essential hypertension   . Hyperlipidemia     Past Surgical History:  Procedure Laterality Date  . APPENDECTOMY    . HEART CHAMBER REVISION     patched hole --[place]  . LAPAROSCOPIC APPENDECTOMY N/A 09/27/2013   Procedure: APPENDECTOMY - CONVERTED FROM LAPAROSCOPIC;  Surgeon: Donnie Mesa, MD;  Location: [facility];  Service: General;  Laterality: N/A;  . RIGHT/LEFT HEART CATH AND CORONARY ANGIOGRAPHY N/A 05/24/2016   Procedure: Right/Left Heart Cath and Coronary Angiography;  Surgeon: Larey Dresser, MD;  Location: [facility];  Service: Cardiovascular;  Laterality: N/A;  . TUBAL LIGATION       OB History   None      Home Medications    Prior to Admission medications  Medication Sig Start Date End Date Taking? Authorizing Provider  acetaminophen (TYLENOL) 500 MG tablet Take 500 mg by mouth every 6 (six) hours as needed for pain.   Yes [provider]  amLODipine (NORVASC) 10 MG tablet Take 1 tablet (10 mg total) by mouth daily. 06/12/17  Yes Arnoldo Lenis, MD  calcitRIOL (ROCALTROL) 0.25 MCG capsule Take 1 capsule (0.25 mcg total) by mouth every Monday, Wednesday, and Friday. 06/27/17  Yes Dena Billet B, PA-C    carvedilol (COREG) 12.5 MG tablet Take 1 tablet (12.5 mg total) by mouth 2 (two) times daily. 06/12/17 10/21/17 Yes BranchAlphonse Guild, MD  Cholecalciferol (VITAMIN D) 2000 UNITS CAPS Take 2,000 Units by mouth daily.    Yes [provider]  cloNIDine (CATAPRES) 0.3 MG tablet Take 1 tablet (0.3 mg total) by mouth 2 (two) times daily. 06/27/17  Yes Orlena Sheldon, PA-C  febuxostat (ULORIC) 40 MG tablet Take 1 tablet (40 mg total) by mouth daily. 09/18/17  Yes Orlena Sheldon, PA-C  furosemide (LASIX) 40 MG tablet Take 60 mg alternating with 40 mg daily. Patient taking differently: Take 40-60 mg by mouth See admin instructions. Take 60 mg alternating with 40 mg daily. 06/12/17  Yes Branch, Alphonse Guild, MD  insulin aspart (NOVOLOG FLEXPEN) 100 UNIT/ML FlexPen INJECT 7 UNITS SUBCUTANEOUSLY WITH LUNCH AND SUPPER. INJECT 15 MINUTES PRIOR TO MEAL OR IMMEDIATELY AFTER MEAL. Patient taking differently: Inject 7 Units into the skin 2 (two) times daily. INJECT 7 UNITS SUBCUTANEOUSLY WITH LUNCH AND SUPPER. INJECT 15 MINUTES PRIOR TO MEAL OR IMMEDIATELY AFTER MEAL. 06/27/17  Yes Orlena Sheldon, PA-C  Insulin Detemir (LEVEMIR FLEXTOUCH) 100 UNIT/ML Pen INJECT 22 UNITS SUBCUTANEOUSLY ONCE DAILY AT  10  PM Patient taking differently: Inject 22 Units into the skin at bedtime. SUBCUTANEOUSLY ONCE DAILY AT  10  PM 06/27/17  Yes Dena Billet B, PA-C  omeprazole (PRILOSEC) 20 MG capsule Take 1 capsule (20 mg total) by mouth daily. 09/18/17  Yes Dena Billet B, PA-C  psyllium (HYDROCIL/METAMUCIL) 95 % PACK Take 1 packet by mouth 3 (three) times daily. 02/19/16  Yes Lavina Hamman, MD  Rivaroxaban (XARELTO) 15 MG TABS tablet Take 1 tablet (15 mg total) by mouth daily. Patient taking differently: Take 15 mg by mouth every morning.  09/19/17  Yes Branch, Alphonse Guild, MD  simvastatin (ZOCOR) 20 MG tablet TAKE ONE TABLET (20MG) BY MOUTH ONCE DAILY AT BEDTIME Patient taking differently: Take 20 mg by mouth at bedtime.  06/13/17  Yes Orlena Sheldon, PA-C  BD PEN NEEDLE MICRO U/F 32G X 6 MM MISC USE AS DIRECTED THREE TIMES DAILY 09/04/17   Dena Billet B, PA-C  Blood Glucose Monitoring Suppl (ACCU-CHEK GUIDE) w/Device KIT USE AS DIRECTED 09/04/17   Orlena Sheldon, PA-C  Blood Glucose Monitoring Suppl KIT Use to check blood sugar  Three times daily before meals and at bedtime.ICD10: E11.8.Please dispense based on insurance preference 06/28/17   Dena Billet B, PA-C  glucose blood test strip Use as directed. Check blood sugar 3 times daily before meals and at bedtime. 06/28/17   Dena Billet B, PA-C  Lancets (ACCU-CHEK SOFT TOUCH) lancets Use to check blood sugar ICD10:e11.8 Dispense based on insurance preference 06/28/17   Dena Billet B, PA-C  Lancets [facility] ULTRASOFT) lancets Use as instructed 02/11/16   Dena Billet B, PA-C  RELION SHORT PEN NEEDLES 31G X 8 MM MISC USE ONE PEN NEEDLE ONCE DAILY AS DIRECTED 03/10/14  Orlena Sheldon, PA-C    Family History Family History  Problem Relation Age of Onset  . Cancer Father   . Diabetes Father   . Cancer Brother        Brain  . Cancer Sister        abdominal fat    Social History Social History   Tobacco Use  . Smoking status: Former Smoker    Packs/day: 0.50    Years: 50.00    Pack years: 25.00    Types: Cigarettes    Last attempt to quit: 01/19/2009    Years since quitting: 8.7  . Smokeless tobacco: Never Used  Substance Use Topics  . Alcohol use: No    Alcohol/week: 0.0 standard drinks  . Drug use: No     Allergies   Ace inhibitors; Actos [pioglitazone]; Aspirin; Metformin and related; and Eliquis [apixaban]   Review of Systems Review of Systems  Unable to perform ROS: Acuity of condition     Physical Exam Updated Vital Signs BP (!) 143/100   Pulse 73   Temp 98.3 F (36.8 C) (Rectal)   Resp (!) 25   Ht _0  (1.6 m)   Wt 80.7 kg   SpO2 91%   BMI 31.53 kg/m   Physical Exam  Constitutional: She is oriented to person, place, and time. She appears  well-developed.  Elderly, frail  HENT:  Head: Normocephalic and atraumatic.  Right Ear: External ear normal.  Left Ear: External ear normal.  Eyes: Pupils are equal, round, and reactive to light. Conjunctivae and EOM are normal.  Neck: Normal range of motion and phonation normal. Neck supple.  Cardiovascular: Normal rate, regular rhythm and normal heart sounds.  No jugular venous distention.  Pulmonary/Chest: Effort normal. She has no decreased breath sounds. She has wheezes. She has rhonchi. She has no rales. She exhibits no bony tenderness.  Abdominal: Soft. There is no tenderness.  Musculoskeletal: Normal range of motion.       Right lower leg: She exhibits edema. She exhibits no tenderness.       Left lower leg: She exhibits edema. She exhibits no tenderness.  Neurological: She is alert and oriented to person, place, and time. No cranial nerve deficit or sensory deficit. She exhibits normal muscle tone. Coordination normal.  Skin: Skin is warm, dry and intact.  Psychiatric: She has a normal mood and affect. Her behavior is normal. Judgment and thought content normal.  Nursing note and vitals reviewed.    ED Treatments / Results  Labs (all labs ordered are listed, but only abnormal results are displayed) Labs Reviewed  CBC WITH DIFFERENTIAL/PLATELET - Abnormal; Notable for the following components:      Result Value   RDW 16.0 (*)    All other components within normal limits  COMPREHENSIVE METABOLIC PANEL - Abnormal; Notable for the following components:   Glucose, Bld 212 (*)    Creatinine, Ser 1.57 (*)    GFR calc non Af Amer 31 (*)    GFR calc Af Amer 36 (*)    All other components within normal limits  BRAIN NATRIURETIC PEPTIDE - Abnormal; Notable for the following components:   B Natriuretic Peptide 398.0 (*)    All other components within normal limits  TROPONIN I  MAGNESIUM  I-STAT CG4 LACTIC ACID, ED    EKG EKG Interpretation  Date/Time:  Sunday October 21 2017 14:59:30 EDT Ventricular Rate:  93 PR Interval:    QRS Duration: 130 QT Interval:  383 QTC Calculation: 477 R Axis:   147 Text Interpretation:  Atrial fibrillation Nonspecific intraventricular conduction delay Anterior infarct, old Repol abnrm suggests ischemia, lateral leads Since last tracing of earlier today artifact resolved Confirmed by Daleen Bo [phone]) on 10/21/2017 3:21:54 PM   Radiology Dg Chest Port 1 View  Result Date: 10/21/2017 CLINICAL DATA:  Shortness of breath with cough EXAM: PORTABLE CHEST 1 VIEW COMPARISON:  May 09, 2017 FINDINGS: There is no edema or consolidation. Heart is mildly enlarged with mild pulmonary venous hypertension. No adenopathy. There is aortic atherosclerosis. Patient is status post median sternotomy. No bone lesions evident. IMPRESSION: Pulmonary vascular congestion without edema or consolidation. There is aortic atherosclerosis. Status post median sternotomy. Aortic Atherosclerosis (ICD10-I70.0). Electronically Signed   By: Lowella Grip III M.D.   On: 10/21/2017 15:36    Procedures .Critical Care Performed by: Daleen Bo, MD Authorized by: Daleen Bo, MD   Critical care provider statement:    Critical care time (minutes):  35   Critical care start time:  10/21/2017 3:05 PM   Critical care end time:  10/21/2017 6:25 PM   Critical care time was exclusive of:  Separately billable procedures and treating other patients   Critical care was necessary to treat or prevent imminent or life-threatening deterioration of the following conditions:  Respiratory failure   Critical care was time spent personally by me on the following activities:  Blood draw for specimens, development of treatment plan with patient or surrogate, discussions with consultants, evaluation of patient's response to treatment, examination of patient, obtaining history from patient or surrogate, ordering and performing treatments and interventions, ordering and review  of laboratory studies, pulse oximetry, re-evaluation of patient's condition, review of old charts and ordering and review of radiographic studies   (including critical care time)  Medications Ordered in ED Medications  albuterol (PROVENTIL) (2.5 MG/3ML) 0.083% nebulizer solution 5 mg (5 mg Nebulization Given 10/21/17 1554)  amoxicillin-clavulanate (AUGMENTIN) 875-125 MG per tablet 1 tablet (1 tablet Oral Given 10/21/17 1652)  predniSONE (DELTASONE) tablet 60 mg (60 mg Oral Given 10/21/17 1652)  furosemide (LASIX) injection 40 mg (40 mg Intravenous Given 10/21/17 1651)  albuterol (PROVENTIL) (2.5 MG/3ML) 0.083% nebulizer solution 5 mg (5 mg Nebulization Given 10/21/17 1736)  ondansetron (ZOFRAN) injection 4 mg (4 mg Intravenous Given 10/21/17 1827)     Initial Impression / Assessment and Plan / ED Course  I have reviewed the triage vital signs and the nursing notes.  Pertinent labs & imaging results that were available during my care of the patient were reviewed by me and considered in my medical decision making (see chart for details).  Clinical Course as of Oct 22 1835  Nancy Fetter Oct 21, 2017  1613 Normal  I-Stat CG4 Lactic Acid, ED [EW]  1613 Normal except glucose high, creatinine high  Comprehensive metabolic panel(!) [EW]  4010 Mild elevation  Brain natriuretic peptide(!) [EW]  1614 Normal  Magnesium [EW]  1614 Normal  Troponin I [EW]  1614 Normal  CBC with Differential(!) [EW]  1614 Venous congestion without CHF or infiltrate.  Images reviewed by me  DG Chest [facility] [EW]  [id number] Time she is much more comfortable, relaxing in the summer Fowlers position.  Lungs with improved air movement but continued wheezing.  Heart rate 97.  He is on oxygen at 2 L with a normal oxygen saturation.   [EW]  [id number] Patient ambulated to the bathroom, without oxygen and had oxygen desaturation to the mid 80s.   [  EW]    Clinical Course User Index [EW] Daleen Bo, MD     Patient Vitals for the  past 24 hrs:  BP Temp Temp src Pulse Resp SpO2 Height Weight  10/21/17 1823 - - - 73 (!) 25 91 % - -  10/21/17 1800 (!) 143/100 - - (!) 106 18 93 % - -  10/21/17 1736 - - - - - 95 % - -  10/21/17 1730 - - - (!) 103 20 94 % - -  10/21/17 1700 (!) 164/69 - - (!) 101 20 95 % - -  10/21/17 1630 134/69 - - (!) 53 19 96 % - -  10/21/17 1600 (!) 135/93 - - 100 20 97 % - -  10/21/17 1554 - - - - - 94 % - -  10/21/17 1530 139/75 - - 90 20 97 % - -  10/21/17 1525 - 98.3 F (36.8 C) Rectal - - - - -  10/21/17 1509 - - - - - 96 % - -  10/21/17 1500 (!) 161/95 - - 92 (!) 22 96 % - -  10/21/17 1447 - - - - - - _0  (1.6 m) 80.7 kg  10/21/17 1445 (!) 151/81 97.9 F (36.6 C) Oral (!) 104 (!) 26 90 % - -    6:24 PM Reevaluation with update and discussion. After initial assessment and treatment, an updated evaluation reveals she continues to be somewhat uncomfortable.  Findings discussed with the patient and all questions were answered. Daleen Bo   Medical Decision Making: Shortness of breath likely will factorial with bronchitis, and pulmonary vascular congestion.  Doubt ACS, PE or pneumonia.  Patient with hypoxia on ambulation.  She does not currently have oxygen at home.  She will require hospitalization for further evaluation treatment, and observed setting.  CRITICAL CARE-yes Performed by: Daleen Bo  Nursing Notes Reviewed/ Care Coordinated Applicable Imaging Reviewed Interpretation of Laboratory Data incorporated into ED treatment  6:25 PM-Consult complete with hospitalist. Patient case explained and discussed.  He agrees to admit patient for further evaluation and treatment. Call ended at 6:36 PM  Plan: Admit  Final Clinical Impressions(s) / ED Diagnoses   Final diagnoses:  Bronchitis  Hypoxia  Pulmonary vascular congestion    ED Discharge Orders    None       Daleen Bo, MD 10/21/17 [phone]

## 2017-10-21 NOTE — H&P (Signed)
History and Physical  Laura Best HKV:[id number] DOB: [birth year] DOA: 10/21/2017  Referring physician: Dr Eulis Foster, ED physician PCP: Rennis Golden  Outpatient Specialists:  Dr Harl Bowie (Cardiology)  Patient Coming From: home  Chief Complaint: Shortness of breath, cough  HPI: Laura Best is a 77 y.o. female with a history of coronary artery disease, CKD stage III, COPD, hyperlipidemia, hypertension, diastolic heart failure with preserved LVEF, diabetes.  Patient began having short of breath last night that has been increasing since this morning.  Also has increased cough with purulent sputum production.  Also has increased peripheral edema over the past couple of days.  Breathing worse with exertion and improved with rest.  Also improved with nebulizer treatments.  No fevers, chills, nausea, chest pain.  She does have orthopnea, appears to be chronic.  Emergency Department Course: Chest x-ray shows vascular congestion no pulmonary edema. BNP 398.  Patient received steroids, nebulizer treatments, Lasix, Augmentin.  Patient vomited shortly after taking Augmentin  Review of Systems:    Pt denies any fevers, chills, nausea, vomiting, diarrhea, constipation, abdominal pain, palpitations, headache, vision changes, lightheadedness, dizziness, melena, rectal bleeding.  Review of systems are otherwise negative  Past Medical History:  Diagnosis Date  . Arthritis    "hands, feet" (02/17/2016)  . CAD (coronary artery disease)    a. 05/2016: cath showing nonobstructive CAD with 50% prox-Cx stenosis  . CKD (chronic kidney disease) stage 3, GFR 30-59 ml/min (HCC) 12/25/2012  . COPD (chronic obstructive pulmonary disease) ([place])   . Diastolic heart failure ([place])   . Hyperlipidemia   . Hypertension   . Osteoporosis   . Pneumonia    "several times" (02/17/2016)  . Small bowel obstruction ([place]) 02/17/2016  . Tricuspid regurgitation    Echo 05/02/06-Nml LV. Mild MR. Mod TR  . Type II diabetes  mellitus ([place])   . Vitamin D deficiency    Past Surgical History:  Procedure Laterality Date  . APPENDECTOMY    . HEART CHAMBER REVISION     patched hole --[place]  . LAPAROSCOPIC APPENDECTOMY N/A 09/27/2013   Procedure: APPENDECTOMY - CONVERTED FROM LAPAROSCOPIC;  Surgeon: Donnie Mesa, MD;  Location: [place];  Service: General;  Laterality: N/A;  . RIGHT/LEFT HEART CATH AND CORONARY ANGIOGRAPHY N/A 05/24/2016   Procedure: Right/Left Heart Cath and Coronary Angiography;  Surgeon: Larey Dresser, MD;  Location: [facility];  Service: Cardiovascular;  Laterality: N/A;  . TUBAL LIGATION     Social History:  reports that she quit smoking about 8 years ago. Her smoking use included cigarettes. She has a 25.00 pack-year smoking history. She has never used smokeless tobacco. She reports that she does not drink alcohol or use drugs. Patient lives at home  Allergies  Allergen Reactions  . Ace Inhibitors Other (See Comments)    Hyperkalemia  . Actos [Pioglitazone] Swelling  . Aspirin Other (See Comments)    G.I. Upset. In high doses  . Metformin And Related Diarrhea  . Eliquis [Apixaban] Rash and Other (See Comments)    Patient states that this med was d/ced by prescriber due to interference with allergies and states that she also had a breakout    Family History  Problem Relation Age of Onset  . Cancer Father   . Diabetes Father   . Cancer Brother        Brain  . Cancer Sister        abdominal fat      Prior  to Admission medications   Medication Sig Start Date End Date Taking? Authorizing Provider  acetaminophen (TYLENOL) 500 MG tablet Take 500 mg by mouth every 6 (six) hours as needed for pain.   Yes [provider]  amLODipine (NORVASC) 10 MG tablet Take 1 tablet (10 mg total) by mouth daily. 06/12/17  Yes Arnoldo Lenis, MD  calcitRIOL (ROCALTROL) 0.25 MCG capsule Take 1 capsule (0.25 mcg total) by mouth every Monday, Wednesday, and Friday. 06/27/17  Yes Dena Billet B, PA-C  carvedilol (COREG) 12.5 MG tablet Take 1 tablet (12.5 mg total) by mouth 2 (two) times daily. 06/12/17 10/21/17 Yes BranchAlphonse Guild, MD  Cholecalciferol (VITAMIN D) 2000 UNITS CAPS Take 2,000 Units by mouth daily.    Yes [provider]  cloNIDine (CATAPRES) 0.3 MG tablet Take 1 tablet (0.3 mg total) by mouth 2 (two) times daily. 06/27/17  Yes Orlena Sheldon, PA-C  febuxostat (ULORIC) 40 MG tablet Take 1 tablet (40 mg total) by mouth daily. 09/18/17  Yes Orlena Sheldon, PA-C  furosemide (LASIX) 40 MG tablet Take 60 mg alternating with 40 mg daily. Patient taking differently: Take 40-60 mg by mouth See admin instructions. Take 60 mg alternating with 40 mg daily. 06/12/17  Yes Branch, Alphonse Guild, MD  insulin aspart (NOVOLOG FLEXPEN) 100 UNIT/ML FlexPen INJECT 7 UNITS SUBCUTANEOUSLY WITH LUNCH AND SUPPER. INJECT 15 MINUTES PRIOR TO MEAL OR IMMEDIATELY AFTER MEAL. Patient taking differently: Inject 7 Units into the skin 2 (two) times daily. INJECT 7 UNITS SUBCUTANEOUSLY WITH LUNCH AND SUPPER. INJECT 15 MINUTES PRIOR TO MEAL OR IMMEDIATELY AFTER MEAL. 06/27/17  Yes Orlena Sheldon, PA-C  Insulin Detemir (LEVEMIR FLEXTOUCH) 100 UNIT/ML Pen INJECT 22 UNITS SUBCUTANEOUSLY ONCE DAILY AT  10  PM Patient taking differently: Inject 22 Units into the skin at bedtime. SUBCUTANEOUSLY ONCE DAILY AT  10  PM 06/27/17  Yes Dena Billet B, PA-C  omeprazole (PRILOSEC) 20 MG capsule Take 1 capsule (20 mg total) by mouth daily. 09/18/17  Yes Dena Billet B, PA-C  psyllium (HYDROCIL/METAMUCIL) 95 % PACK Take 1 packet by mouth 3 (three) times daily. 02/19/16  Yes Lavina Hamman, MD  Rivaroxaban (XARELTO) 15 MG TABS tablet Take 1 tablet (15 mg total) by mouth daily. Patient taking differently: Take 15 mg by mouth every morning.  09/19/17  Yes Branch, Alphonse Guild, MD  simvastatin (ZOCOR) 20 MG tablet TAKE ONE TABLET (20MG) BY MOUTH ONCE DAILY AT BEDTIME Patient taking differently: Take 20 mg by mouth at bedtime.   06/13/17  Yes Orlena Sheldon, PA-C  BD PEN NEEDLE MICRO U/F 32G X 6 MM MISC USE AS DIRECTED THREE TIMES DAILY 09/04/17   Dena Billet B, PA-C  Blood Glucose Monitoring Suppl (ACCU-CHEK GUIDE) w/Device KIT USE AS DIRECTED 09/04/17   Orlena Sheldon, PA-C  Blood Glucose Monitoring Suppl KIT Use to check blood sugar  Three times daily before meals and at bedtime.ICD10: E11.8.Please dispense based on insurance preference 06/28/17   Dena Billet B, PA-C  glucose blood test strip Use as directed. Check blood sugar 3 times daily before meals and at bedtime. 06/28/17   Dena Billet B, PA-C  Lancets (ACCU-CHEK SOFT TOUCH) lancets Use to check blood sugar ICD10:e11.8 Dispense based on insurance preference 06/28/17   Dena Billet B, PA-C  Lancets [facility] ULTRASOFT) lancets Use as instructed 02/11/16   Dena Billet B, PA-C  RELION SHORT PEN NEEDLES 31G X 8 MM MISC USE ONE PEN NEEDLE ONCE DAILY AS  DIRECTED 03/10/14   Orlena Sheldon, PA-C    Physical Exam: BP (!) 143/100   Pulse 73   Temp 98.3 F (36.8 C) (Rectal)   Resp (!) 25   Ht _0  (1.6 m)   Wt 80.7 kg   SpO2 91%   BMI 31.53 kg/m   . General: Elderly Caucasian female. Awake and alert and oriented x3. No acute cardiopulmonary distress.  Marland Kitchen HEENT: Normocephalic atraumatic.  Right and left ears normal in appearance.  Pupils equal, round, reactive to light. Extraocular muscles are intact. Sclerae anicteric and noninjected.  Moist mucosal membranes. No mucosal lesions.  . Neck: Neck supple without lymphadenopathy. No carotid bruits. No masses palpated.  . Cardiovascular: Regular rate with normal S1-S2 sounds. No murmurs, rubs, gallops auscultated.  Increased JVD.  2+ peripheral edema in legs bilaterally. Marland Kitchen Respiratory: Coarse breath sounds throughout.  Coarse expiratory wheezing.  Rales in bases bilaterally.  No accessory muscle use. . Abdomen: Soft, nontender, nondistended. Active bowel sounds. No masses or hepatosplenomegaly  . Skin: No rashes, lesions, or  ulcerations.  Dry, warm to touch. 2+ dorsalis pedis and radial pulses. . Musculoskeletal: No calf or leg pain. All major joints not erythematous nontender.  No upper or lower joint deformation.  Good ROM.  No contractures  . Psychiatric: Intact judgment and insight. Pleasant and cooperative. . Neurologic: No focal neurological deficits. Strength is 5/5 and symmetric in upper and lower extremities.  Cranial nerves II through XII are grossly intact.           Labs on Admission: I have personally reviewed following labs and imaging studies  CBC: Recent Labs  Lab 10/21/17 1506  WBC 6.6  NEUTROABS 4.3  HGB 14.1  HCT 43.4  MCV 87.0  PLT 409   Basic Metabolic Panel: Recent Labs  Lab 10/21/17 1506  NA 137  K 3.8  CL 101  CO2 24  GLUCOSE 212*  BUN 22  CREATININE 1.57*  CALCIUM 9.1  MG 2.1   GFR: Estimated Creatinine Clearance: 30.2 mL/min (A) (by C-G formula based on SCr of 1.57 mg/dL (H)). Liver Function Tests: Recent Labs  Lab 10/21/17 1506  AST 16  ALT 16  ALKPHOS 97  BILITOT 0.9  PROT 7.9  ALBUMIN 4.0   No results for input(s): LIPASE, AMYLASE in the last 168 hours. No results for input(s): AMMONIA in the last 168 hours. Coagulation Profile: No results for input(s): INR, PROTIME in the last 168 hours. Cardiac Enzymes: Recent Labs  Lab 10/21/17 1506  TROPONINI <0.03   BNP (last 3 results) No results for input(s): PROBNP in the last 8760 hours. HbA1C: No results for input(s): HGBA1C in the last 72 hours. CBG: No results for input(s): GLUCAP in the last 168 hours. Lipid Profile: No results for input(s): CHOL, HDL, LDLCALC, TRIG, CHOLHDL, LDLDIRECT in the last 72 hours. Thyroid Function Tests: No results for input(s): TSH, T4TOTAL, FREET4, T3FREE, THYROIDAB in the last 72 hours. Anemia Panel: No results for input(s): VITAMINB12, FOLATE, FERRITIN, TIBC, IRON, RETICCTPCT in the last 72 hours. Urine analysis:    Component Value Date/Time   COLORURINE  YELLOW 01/13/2017 [place] 01/13/2017 1253   LABSPEC 1.015 01/13/2017 1253   PHURINE 5.0 01/13/2017 1253   GLUCOSEU 50 (A) 01/13/2017 1253   HGBUR NEGATIVE 01/13/2017 1253   BILIRUBINUR NEGATIVE 01/13/2017 1253   KETONESUR 5 (A) 01/13/2017 1253   PROTEINUR 30 (A) 01/13/2017 1253   UROBILINOGEN 0.2 05/03/2014 0645   NITRITE NEGATIVE  01/13/2017 1253   LEUKOCYTESUR SMALL (A) 01/13/2017 1253   Sepsis Labs: _0 (procalcitonin:4,lacticidven:4) )No results found for this or any previous visit (from the past 240 hour(s)).   Radiological Exams on Admission: Dg Chest Port 1 View  Result Date: 10/21/2017 CLINICAL DATA:  Shortness of breath with cough EXAM: PORTABLE CHEST 1 VIEW COMPARISON:  May 09, 2017 FINDINGS: There is no edema or consolidation. Heart is mildly enlarged with mild pulmonary venous hypertension. No adenopathy. There is aortic atherosclerosis. Patient is status post median sternotomy. No bone lesions evident. IMPRESSION: Pulmonary vascular congestion without edema or consolidation. There is aortic atherosclerosis. Status post median sternotomy. Aortic Atherosclerosis (ICD10-I70.0). Electronically Signed   By: Lowella Grip III M.D.   On: 10/21/2017 15:36    EKG: Independently reviewed.  Atrial fibrillation with normal ventricular response.  Nonspecific intraventricular conduction delay.  Old anterior infarct.  No acute ST changes  Assessment/Plan: Principal Problem:   Acute on chronic respiratory failure with hypoxia (HCC) Active Problems:   Essential hypertension   COPD exacerbation (HCC)   GERD (gastroesophageal reflux disease)   Diastolic CHF, acute on chronic (HCC)   Diabetic peripheral neuropathy associated with type 2 diabetes mellitus ([place])   Persistent atrial fibrillation ([place])    This patient was discussed with the ED physician, including pertinent vitals, physical exam findings, labs, and imaging.  We also discussed care given by the  ED provider.  1. Acute on chronic respiratory failure with hypoxia a. Continue supplemental oxygen b. We will treat underlying conditions 2. COPD exacerbation Antibiotics: Ceftriaxone DuoNeb's every 6 scheduled with albuterol every 2 when necessary Continue inhaled steroids and LA bronchodilator Solu-Medrol 60 mg IV every 6 hours Mucinex 3. Acute diastolic heart failure Telemetry monitoring Strict I/O Daily Weights Diuresis: Lasix 60 mg IV twice daily Potassium: 40 mEq twice a day by mouth Echo cardiac exam tomorrow Repeat BMP tomorrow 4. Persistent atrial fibrillation a. Currently rate controlled b. Continue carvedilol c. Continue Xarelto 5. Type 2 diabetes a. Sliding scale insulin, continue home insulin regimen 6. Hypertension a. Continue antihypertensives 7. GERD a. Continue PPI  DVT prophylaxis: On Xarelto Consultants: None Code Status: DNR Family Communication: None Disposition Plan: Return home following improvement of respiratory status   Truett Mainland, DO [facility] Pager [phone]  If 7PM-7AM, please contact night-coverage [email or web address] Password [email or web address]

## 2017-10-21 NOTE — ED Notes (Signed)
Pt C/O nausea and vomiting.

## 2017-10-21 NOTE — ED Notes (Signed)
PT became nauseated and vomiting with exertion after using the bathroom. New order for zofran obtained. PT also became increased short of breath with exertion of moving off and on the bedpan. Oxygen sats 86% off of oxygen at this time.

## 2017-10-22 ENCOUNTER — Inpatient Hospital Stay (HOSPITAL_COMMUNITY): Payer: Medicare HMO

## 2017-10-22 DIAGNOSIS — J9601 Acute respiratory failure with hypoxia: Secondary | ICD-10-CM

## 2017-10-22 DIAGNOSIS — I482 Chronic atrial fibrillation, unspecified: Secondary | ICD-10-CM

## 2017-10-22 DIAGNOSIS — Z23 Encounter for immunization: Secondary | ICD-10-CM | POA: Diagnosis present

## 2017-10-22 DIAGNOSIS — I5033 Acute on chronic diastolic (congestive) heart failure: Secondary | ICD-10-CM | POA: Diagnosis present

## 2017-10-22 DIAGNOSIS — I2510 Atherosclerotic heart disease of native coronary artery without angina pectoris: Secondary | ICD-10-CM | POA: Diagnosis present

## 2017-10-22 DIAGNOSIS — I509 Heart failure, unspecified: Secondary | ICD-10-CM

## 2017-10-22 DIAGNOSIS — J9621 Acute and chronic respiratory failure with hypoxia: Secondary | ICD-10-CM | POA: Diagnosis present

## 2017-10-22 DIAGNOSIS — Z87891 Personal history of nicotine dependence: Secondary | ICD-10-CM | POA: Diagnosis not present

## 2017-10-22 DIAGNOSIS — E1165 Type 2 diabetes mellitus with hyperglycemia: Secondary | ICD-10-CM | POA: Diagnosis present

## 2017-10-22 DIAGNOSIS — E785 Hyperlipidemia, unspecified: Secondary | ICD-10-CM | POA: Diagnosis present

## 2017-10-22 DIAGNOSIS — E11649 Type 2 diabetes mellitus with hypoglycemia without coma: Secondary | ICD-10-CM | POA: Diagnosis present

## 2017-10-22 DIAGNOSIS — E1122 Type 2 diabetes mellitus with diabetic chronic kidney disease: Secondary | ICD-10-CM | POA: Diagnosis present

## 2017-10-22 DIAGNOSIS — Z7901 Long term (current) use of anticoagulants: Secondary | ICD-10-CM | POA: Diagnosis not present

## 2017-10-22 DIAGNOSIS — K219 Gastro-esophageal reflux disease without esophagitis: Secondary | ICD-10-CM | POA: Diagnosis present

## 2017-10-22 DIAGNOSIS — J9620 Acute and chronic respiratory failure, unspecified whether with hypoxia or hypercapnia: Secondary | ICD-10-CM | POA: Diagnosis present

## 2017-10-22 DIAGNOSIS — E1121 Type 2 diabetes mellitus with diabetic nephropathy: Secondary | ICD-10-CM | POA: Diagnosis present

## 2017-10-22 DIAGNOSIS — J40 Bronchitis, not specified as acute or chronic: Secondary | ICD-10-CM | POA: Diagnosis present

## 2017-10-22 DIAGNOSIS — N183 Chronic kidney disease, stage 3 (moderate): Secondary | ICD-10-CM | POA: Diagnosis present

## 2017-10-22 DIAGNOSIS — I130 Hypertensive heart and chronic kidney disease with heart failure and stage 1 through stage 4 chronic kidney disease, or unspecified chronic kidney disease: Secondary | ICD-10-CM | POA: Diagnosis present

## 2017-10-22 DIAGNOSIS — J441 Chronic obstructive pulmonary disease with (acute) exacerbation: Secondary | ICD-10-CM | POA: Diagnosis present

## 2017-10-22 DIAGNOSIS — M109 Gout, unspecified: Secondary | ICD-10-CM | POA: Diagnosis present

## 2017-10-22 DIAGNOSIS — Z794 Long term (current) use of insulin: Secondary | ICD-10-CM | POA: Diagnosis not present

## 2017-10-22 DIAGNOSIS — M810 Age-related osteoporosis without current pathological fracture: Secondary | ICD-10-CM | POA: Diagnosis present

## 2017-10-22 DIAGNOSIS — Z66 Do not resuscitate: Secondary | ICD-10-CM | POA: Diagnosis present

## 2017-10-22 DIAGNOSIS — Z888 Allergy status to other drugs, medicaments and biological substances status: Secondary | ICD-10-CM | POA: Diagnosis not present

## 2017-10-22 DIAGNOSIS — E1142 Type 2 diabetes mellitus with diabetic polyneuropathy: Secondary | ICD-10-CM | POA: Diagnosis present

## 2017-10-22 LAB — RESPIRATORY PANEL BY PCR
ADENOVIRUS-RVPPCR: NOT DETECTED
Bordetella pertussis: NOT DETECTED
CORONAVIRUS HKU1-RVPPCR: NOT DETECTED
CORONAVIRUS NL63-RVPPCR: NOT DETECTED
CORONAVIRUS OC43-RVPPCR: NOT DETECTED
Chlamydophila pneumoniae: NOT DETECTED
Coronavirus 229E: NOT DETECTED
INFLUENZA B-RVPPCR: NOT DETECTED
Influenza A: NOT DETECTED
METAPNEUMOVIRUS-RVPPCR: NOT DETECTED
MYCOPLASMA PNEUMONIAE-RVPPCR: NOT DETECTED
PARAINFLUENZA VIRUS 1-RVPPCR: NOT DETECTED
PARAINFLUENZA VIRUS 2-RVPPCR: NOT DETECTED
Parainfluenza Virus 3: NOT DETECTED
Parainfluenza Virus 4: NOT DETECTED
Respiratory Syncytial Virus: NOT DETECTED
Rhinovirus / Enterovirus: DETECTED — AB

## 2017-10-22 LAB — BASIC METABOLIC PANEL
Anion gap: 10 (ref 5–15)
BUN: 25 mg/dL — AB (ref 8–23)
CALCIUM: 8.6 mg/dL — AB (ref 8.9–10.3)
CO2: 26 mmol/L (ref 22–32)
CREATININE: 1.72 mg/dL — AB (ref 0.44–1.00)
Chloride: 103 mmol/L (ref 98–111)
GFR calc Af Amer: 32 mL/min — ABNORMAL LOW (ref >60)
GFR, EST NON AFRICAN AMERICAN: 27 mL/min — AB (ref >60)
Glucose, Bld: 238 mg/dL — ABNORMAL HIGH (ref 70–99)
Potassium: 4.2 mmol/L (ref 3.5–5.1)
SODIUM: 139 mmol/L (ref 135–145)

## 2017-10-22 LAB — GLUCOSE, CAPILLARY
GLUCOSE-CAPILLARY: 242 mg/dL — AB (ref 70–99)
GLUCOSE-CAPILLARY: 328 mg/dL — AB (ref 70–99)
GLUCOSE-CAPILLARY: 267 mg/dL — AB (ref 70–99)
Glucose-Capillary: 317 mg/dL — ABNORMAL HIGH (ref 70–99)

## 2017-10-22 LAB — ECHOCARDIOGRAM COMPLETE
Height: 63 in
WEIGHTICAEL: 2716.07 [oz_av]

## 2017-10-22 MED ORDER — POTASSIUM CHLORIDE CRYS ER 20 MEQ PO TBCR
40.0000 meq | EXTENDED_RELEASE_TABLET | Freq: Every day | ORAL | Status: DC
  Administered 2017-10-22 – 2017-10-23 (×2): 40 meq via ORAL
  Filled 2017-10-22: qty 2

## 2017-10-22 MED ORDER — INSULIN ASPART 100 UNIT/ML ~~LOC~~ SOLN
5.0000 [IU] | Freq: Three times a day (TID) | SUBCUTANEOUS | Status: DC
  Administered 2017-10-23 (×2): 5 [IU] via SUBCUTANEOUS

## 2017-10-22 MED ORDER — GLUCERNA SHAKE PO LIQD
237.0000 mL | ORAL | Status: DC
  Administered 2017-10-24: 237 mL via ORAL

## 2017-10-22 MED ORDER — ORAL CARE MOUTH RINSE
15.0000 mL | Freq: Two times a day (BID) | OROMUCOSAL | Status: DC
  Administered 2017-10-23 – 2017-10-24 (×3): 15 mL via OROMUCOSAL

## 2017-10-22 MED ORDER — METHYLPREDNISOLONE SODIUM SUCC 125 MG IJ SOLR
60.0000 mg | Freq: Four times a day (QID) | INTRAMUSCULAR | Status: DC
  Administered 2017-10-22 – 2017-10-24 (×10): 60 mg via INTRAVENOUS
  Filled 2017-10-22 (×10): qty 2

## 2017-10-22 MED ORDER — BUDESONIDE 0.5 MG/2ML IN SUSP
0.5000 mg | Freq: Two times a day (BID) | RESPIRATORY_TRACT | Status: DC
  Administered 2017-10-22 – 2017-10-24 (×5): 0.5 mg via RESPIRATORY_TRACT
  Filled 2017-10-22 (×5): qty 2

## 2017-10-22 NOTE — Care Management Note (Signed)
Case Management Note  Patient Details  Name: Laura Best MRN: [id number] Date of Birth: [birth year]  Subjective/Objective:          Admitted with respiratory failure. Pt from home, lives with son, ind with ADL's. Pt has insurance with drug coverage and PCP. Pt has used HH in the past with [facility]. Pt interested in [facility] again, requests [facility]. Pt is on oxygen acutely and would like to have home O2 assessment prior to DC. She does not have neb machine pta.          Action/Plan: DC will cont to follow. Pt will need home O2 assessment prior to DC, ? Need for neb machine. Vaughan Basta, [facility] rep, given [facility] referral. CM will cont to follow for DC planning needs.   Expected Discharge Date:    10/23/17              Expected Discharge Plan:  [place]  In-House Referral:  NA  Discharge planning Services  CM Consult  Post Acute Care Choice:  Durable Medical Equipment, Home Health Choice offered to:     DME Arranged:    DME Agency:     HH Arranged:  RN, PT [place] Agency:  [place]  Status of Service:  In process, will continue to follow  If discussed at Long Length of Stay Meetings, dates discussed:    Additional Comments:  Sherald Barge, RN 10/22/2017, 2:08 PM

## 2017-10-22 NOTE — Progress Notes (Signed)
PROGRESS NOTE  Laura DUNNIGAN DPO:[id number] DOB: [birth year] DOA: 10/21/2017 PCP: Orlena Sheldon, PA-C  Brief History:  77 year old female with a history of CAD, CKD stage III, COPD, hyper lipidemia, hypertension, diastolic CHF, diabetes mellitus type 2 presenting with shortness of breath began on the morning of 10/12/2017.  Patient states that her symptoms progressed to the point of increasing coughing and wheezing.  In addition, the patient states that she has gained approximately 3 pounds in the past week.  She endorses compliance with her medications as well as her diet.  The patient has also complained of some increase in her lower extremity edema over the past week.  She quit smoking approximately 12 years ago.  She has 20-pack-year history of tobacco.  She denies any fevers, chills, nausea, vomiting, diarrhea, abdominal pain, dysuria. She saw Dr. Harl Bowie on 10/12/17 at which time she was instructed to increase lasix to 60 mg daily x 4 days, then back to 60mg  alternating with 40 mg.  Her weight was 175 during that office visit. In the emergency department, the patient was afebrile hemodynamically stable saturating 90% on room air.  Chest x-ray showed pulmonary venous congestion and chronic interstitial changes.  The pt was started on IV lasix and solumedrol.  Assessment/Plan: Acute Respiratory Failure with hypoxia -multifactorial including CHF exacerbation, COPD exacerbation in the setting of possible OSA/OHS -currently stable on 3L -wean oxygen for saturation > 92%  Acute on Chronic diastolic CHF -baseline weight around 170 according to previous cardiology notes -continue IV lasix -daily weights -accurate I/Os -continue coreg  COPD Exacerbation -continue Duonebs -continue pulmicort -continue IV solumedrol -check viral respiratory panel  Chronic atrial fibrillation -continue rivaroxaban -Continue carvedilol  CKD stage III -Baseline creatinine 1.5-1.8 -Monitor with  diuresis  Diabetes mellitus type 2, uncontrolled with hyperglycemia -Anticipate elevated CBG secondary to IV steroids -NovoLog sliding scale -Continue Levemir 20 units -NovoLog 7 units with meals  Essential hypertension -Continue carvedilol, amlodipine, clonidine  Hyperlipidemia -continue statin    Disposition Plan:   Home in 2-3 days  Family Communication:  No Family at bedside  Consultants:  none  Code Status: DNR  DVT Prophylaxis:  Xarelto   Procedures: As Listed in Progress Note Above  Antibiotics: None    Subjective: Patient states that she has been a little better but continues to have dyspnea on mild exertion.  She denies any nausea vomiting or diarrhea.  She continued to have a nonproductive cough.  She still having "rattling and wheezing."  There is no dysuria, hematuria, hematochezia, melena.  Objective: Vitals:   10/21/17 2215 10/22/17 0114 10/22/17 0423 10/22/17 0552  BP:    111/61  Pulse:    76  Resp:    18  Temp:    98.6 F (37 C)  TempSrc:    Oral  SpO2: (!) 86% 95%  98%  Weight:   77 kg   Height:        Intake/Output Summary (Last 24 hours) at 10/22/2017 0817 Last data filed at 10/22/2017 4315 Gross per 24 hour  Intake 689.6 ml  Output -  Net 689.6 ml   Weight change:  Exam:   General:  Pt is alert, follows commands appropriately, not in acute distress  HEENT: No icterus, No thrush, No neck mass, [place]/AT  Cardiovascular: IRRR, S1/S2, no rubs, no gallops  Respiratory: Bilateral scattered rhonchi to bilateral wheezing.  Good air movement.  Abdomen: Soft/+BS, non tender, non distended, no  guarding  Extremities: 1 + LE edema, No lymphangitis, No petechiae, No rashes, no synovitis   Data Reviewed: I have personally reviewed following labs and imaging studies Basic Metabolic Panel: Recent Labs  Lab 10/21/17 1506 10/22/17 0516  NA 137 139  K 3.8 4.2  CL 101 103  CO2 24 26  GLUCOSE 212* 238*  BUN 22 25*  CREATININE 1.57*  1.72*  CALCIUM 9.1 8.6*  MG 2.1  --    Liver Function Tests: Recent Labs  Lab 10/21/17 1506  AST 16  ALT 16  ALKPHOS 97  BILITOT 0.9  PROT 7.9  ALBUMIN 4.0   No results for input(s): LIPASE, AMYLASE in the last 168 hours. No results for input(s): AMMONIA in the last 168 hours. Coagulation Profile: No results for input(s): INR, PROTIME in the last 168 hours. CBC: Recent Labs  Lab 10/21/17 1506  WBC 6.6  NEUTROABS 4.3  HGB 14.1  HCT 43.4  MCV 87.0  PLT 202   Cardiac Enzymes: Recent Labs  Lab 10/21/17 1506  TROPONINI <0.03   BNP: Invalid input(s): POCBNP CBG: Recent Labs  Lab 10/21/17 2205 10/22/17 0750  GLUCAP 247* 242*   HbA1C: No results for input(s): HGBA1C in the last 72 hours. Urine analysis:    Component Value Date/Time   COLORURINE YELLOW 01/13/2017 [place] 01/13/2017 1253   LABSPEC 1.015 01/13/2017 1253   PHURINE 5.0 01/13/2017 1253   GLUCOSEU 50 (A) 01/13/2017 1253   HGBUR NEGATIVE 01/13/2017 1253   BILIRUBINUR NEGATIVE 01/13/2017 1253   KETONESUR 5 (A) 01/13/2017 1253   PROTEINUR 30 (A) 01/13/2017 1253   UROBILINOGEN 0.2 05/03/2014 0645   NITRITE NEGATIVE 01/13/2017 1253   LEUKOCYTESUR SMALL (A) 01/13/2017 1253   Sepsis Labs: @LABRCNTIP (procalcitonin:4,lacticidven:4) )No results found for this or any previous visit (from the past 240 hour(s)).   Scheduled Meds: . amLODipine  10 mg Oral Daily  . calcitRIOL  0.25 mcg Oral Q M,W,F  . carvedilol  12.5 mg Oral BID WC  . cloNIDine  0.3 mg Oral BID  . febuxostat  40 mg Oral Daily  . furosemide  60 mg Intravenous BID  . Influenza vac split quadrivalent PF  0.5 mL Intramuscular Tomorrow-1000  . insulin aspart  0-20 Units Subcutaneous TID WC  . insulin aspart  0-5 Units Subcutaneous QHS  . insulin aspart  7 Units Subcutaneous BID AC  . insulin detemir  22 Units Subcutaneous QHS  . ipratropium-albuterol  3 mL Nebulization Q6H  . [START ON 10/23/2017] mouth rinse  15 mL  Mouth Rinse BID  . methylPREDNISolone (SOLU-MEDROL) injection  60 mg Intravenous Q6H   Followed by  . [START ON 10/23/2017] predniSONE  40 mg Oral Q breakfast  . pantoprazole  40 mg Oral Daily  . potassium chloride  40 mEq Oral BID  . Rivaroxaban  15 mg Oral q morning - 10a  . simvastatin  20 mg Oral QHS  . sodium chloride flush  3 mL Intravenous Q12H   Continuous Infusions: . sodium chloride    . cefTRIAXone (ROCEPHIN)  IV Stopped (10/21/17 2340)    Procedures/Studies: Dg Chest Port 1 View  Result Date: 10/21/2017 CLINICAL DATA:  Shortness of breath with cough EXAM: PORTABLE CHEST 1 VIEW COMPARISON:  May 09, 2017 FINDINGS: There is no edema or consolidation. Heart is mildly enlarged with mild pulmonary venous hypertension. No adenopathy. There is aortic atherosclerosis. Patient is status post median sternotomy. No bone lesions evident. IMPRESSION: Pulmonary vascular congestion without edema  or consolidation. There is aortic atherosclerosis. Status post median sternotomy. Aortic Atherosclerosis (ICD10-I70.0). Electronically Signed   By: Lowella Grip III M.D.   On: 10/21/2017 15:36    Orson Eva, DO  [facility] Pager [phone]  If 7PM-7AM, please contact night-coverage [email or web address] Password [email or web address] 10/22/2017, 8:17 AM   LOS: 0 days

## 2017-10-22 NOTE — Evaluation (Signed)
Occupational Therapy Evaluation Patient Details Name: Laura Best MRN: [id number] DOB: 01/11/1941 Today's Date: 10/22/2017    History of Present Illness 77 year old female with a history of CAD, CKD stage III, COPD, hyper lipidemia, hypertension, diastolic CHF, diabetes mellitus type 2 presenting with shortness of breath began on the morning of 10/12/2017.  Patient states that her symptoms progressed to the point of increasing coughing and wheezing.  In addition, the patient states that she has gained approximately 3 pounds in the past week.  She endorses compliance with her medications as well as her diet.  The patient has also complained of some increase in her lower extremity edema over the past week.  She quit smoking approximately 12 years ago.   Clinical Impression   Patient in bed upon therapy arrival and agreeable to participate in OT evaluation. Patient reports that she has been to the bathroom and back several times and is feeling better than when she initially arrived. Patient is presenting at baseline for all basic ADL tasks at supervision level to modified independence. She lives with family who are arrival to assist her as needed. She reports that she has a 2 wheeled walker although only uses it if she absolutely needs to. At this time, patient does not require any additional OT services; will sign off.     Follow Up Recommendations  No OT follow up    Equipment Recommendations  None recommended by OT       Precautions / Restrictions Precautions Precautions: None Restrictions Weight Bearing Restrictions: No      Mobility Bed Mobility Overal bed mobility: Modified Independent                Transfers Overall transfer level: Modified independent Equipment used: None                      ADL either performed or assessed with clinical judgement   ADL Overall ADL's : At baseline       General ADL Comments: Patient was able to complete bathing with  set-up of supplies while completing sit to stands with supervision. Patient was able to donn depends briefs while seated and then standing to pull up with only supervision. Therapist assisted with hospital gown donning due to cords.     Vision Baseline Vision/History: Wears glasses Patient Visual Report: No change from baseline              Pertinent Vitals/Pain Pain Assessment: No/denies pain     Hand Dominance Right   Extremity/Trunk Assessment Upper Extremity Assessment Upper Extremity Assessment: Overall WFL for tasks assessed   Lower Extremity Assessment Lower Extremity Assessment: Defer to PT evaluation       Communication Communication Communication: No difficulties   Cognition Arousal/Alertness: Awake/alert Behavior During Therapy: WFL for tasks assessed/performed Overall Cognitive Status: Within Functional Limits for tasks assessed                 Home Living Family/patient expects to be discharged to:: Private residence Living Arrangements: Children(son, daughter-in-law, 2 grandchildren) Available Help at Discharge: Family;Available PRN/intermittently Type of Home: Mobile home Home Access: Stairs to enter Entrance Stairs-Number of Steps: 1 Entrance Stairs-Rails: None Home Layout: One level     Bathroom Shower/Tub: Occupational psychologist: Standard     Home Equipment: Environmental consultant - 2 wheels;Bedside commode   Additional Comments: keeps BSC over toilet, does not use RW unless she absolutely needs to      Prior  Functioning/Environment Level of Independence: Independent with assistive devices                                          AM-PAC PT "6 Clicks" Daily Activity     Outcome Measure Help from another person eating meals?: None Help from another person taking care of personal grooming?: None Help from another person toileting, which includes using toliet, bedpan, or urinal?: None Help from another person bathing (including  washing, rinsing, drying)?: None Help from another person to put on and taking off regular upper body clothing?: None Help from another person to put on and taking off regular lower body clothing?: None 6 Click Score: 24   End of Session Equipment Utilized During Treatment: Oxygen  Activity Tolerance: Patient tolerated treatment well Patient left: in chair;with call bell/phone within reach;Other (comment)(with respiratory therapist)  OT Visit Diagnosis: Muscle weakness (generalized) (M62.81)                Time: 3668-1594 OT Time Calculation (min): 20 min Charges:  OT General Charges $OT Visit: 1 Visit OT Evaluation $OT Eval Low Complexity: [place], OTR/L,CBIS  [phone]   Laura Best, Laura Best 10/22/2017, 9:28 AM

## 2017-10-22 NOTE — Progress Notes (Signed)
*  PRELIMINARY RESULTS* Echocardiogram 2D Echocardiogram has been performed.  Leavy Cella 10/22/2017, 3:21 PM

## 2017-10-22 NOTE — Progress Notes (Addendum)
Initial Nutrition Assessment  DOCUMENTATION CODES:   Obesity unspecified  INTERVENTION:  Glucerna Shake po daily supplement provides 220 kcal and 10 grams of protein    NUTRITION DIAGNOSIS:  (Decreased protein needs ) related to chronic illness(CKD-3) as evidenced by (current standard clinical nutrition practice).   GOAL:  Patient will meet greater than or equal to 90% of their needs(while preserving renal function)   MONITOR:   PO intake, Labs, Weight trends, I & O's REASON FOR ASSESSMENT:   Consult Assessment of nutrition requirement/status, COPD Protocol  ASSESSMENT:  Patient is a 77 yo female with hx of DM-2,  HTN, diastolic HF, COPD,PNA, CKD-3, CAD, Vitamin D deficiency and constipation. She presents with shortness of breath.   Home diet; Low Sodium. Patient lives with son. She prepares her breakfast and lunch and he provides dinner. Patient endorses a good appetite, denies chew or swallow difficulty and self-reports  > 50% of meals consumed.   Weight hx: stable between 76-78 kg (167-171 lb) since January except on 9/20- wt of 80.7 kg (177.5 lb). PT recommending Home Health to increase safety and independence.   Intake/Output Summary (Last 24 hours) at 10/22/2017 1648 Last data filed at 10/22/2017 0637 Gross per 24 hour  Intake 689.6 ml  Output -  Net 689.6 ml     Labs:  On 08/15/17 A1C-8.0% BMP Latest Ref Rng & Units 10/22/2017 10/21/2017 06/21/2017  Glucose 70 - 99 mg/dL 238(H) 212(H) 124(H)  BUN 8 - 23 mg/dL 25(H) 22 29(H)  Creatinine 0.44 - 1.00 mg/dL 1.72(H) 1.57(H) 2.04(H)  BUN/Creat Ratio 6 - 22 (calc) - - 14  Sodium 135 - 145 mmol/L 139 137 142  Potassium 3.5 - 5.1 mmol/L 4.2 3.8 3.8  Chloride 98 - 111 mmol/L 103 101 103  CO2 22 - 32 mmol/L 26 24 29   Calcium 8.9 - 10.3 mg/dL 8.6(L) 9.1 9.2     Medications reviewed and include: coreg, Lasix (60 mg) BID, calcitriol, SSI and levemir (22 units), Protonix, prednisone, KCL and Zocor.   NUTRITION - FOCUSED  PHYSICAL EXAM:    Most Recent Value  Orbital Region  Mild depletion  Upper Arm Region  No depletion  Thoracic and Lumbar Region  No depletion  Buccal Region  Mild depletion  Temple Region  Mild depletion  Clavicle Bone Region  Mild depletion  Edema (RD Assessment)  Moderate  Hair  Reviewed  Mouth  Reviewed      Diet Order:   Diet Order            Diet heart healthy/carb modified Room service appropriate? Yes; Fluid consistency: Thin  Diet effective now              EDUCATION NEEDS:   No education needs have been identified at this time Skin:  Skin Assessment: Reviewed RN Assessment  Last BM:  unknown- constipation per nursing  Height:   Ht Readings from Last 1 Encounters:  10/21/17 5\' 3"  (1.6 m)    Weight:   Wt Readings from Last 1 Encounters:  10/22/17 77 kg    Ideal Body Weight:  52 kg  BMI:  Body mass index is 30.07 kg/m.  Estimated Nutritional Needs:   Kcal:  2542-7062  (21-23 kcal/kg/bw)  Protein:  54-62 gr (0.7-0.8 gr/kg/bw)  Fluid:  <2 liters daily    Colman Cater MS,RD,CSG,LDN Office: [phone] Pager: [phone]

## 2017-10-22 NOTE — Evaluation (Signed)
Physical Therapy Evaluation Patient Details Name: Laura Best MRN: [id number] DOB: Sep 02, 1940 Today's Date: 10/22/2017   History of Present Illness  77 year old female with a history of CAD, CKD stage III, COPD, hyper lipidemia, hypertension, diastolic CHF, diabetes mellitus type 2 presenting with shortness of breath began on the morning of 10/12/2017.  Patient states that her symptoms progressed to the point of increasing coughing and wheezing.  In addition, the patient states that she has gained approximately 3 pounds in the past week.  She endorses compliance with her medications as well as her diet.  The patient has also complained of some increase in her lower extremity edema over the past week.  She quit smoking approximately 12 years ago.  Clinical Impression  Pt admitted with above diagnosis. Pt currently with functional limitations due to the deficits listed below (see "PT Problem List"). Upon entry, pt in bed, no family/caregiver present. The pt is awake and agreeable to participate. The pt is alert and oriented x3, pleasant, conversational, and following simple commands consistently. Pt tolerates 4 intervals of 38ft AMB without AD, very slow indicative of falls risk, and with 2 LOB> AMB is performed on RW with SpO2 dropping to 87%, HR: 120 bpm, and slight increased DOE at end of distance. Seated recovery time requires 2L/min to recover >87%SpO2. Functional mobility assessment demonstrates increased effort/time requirements, poor tolerance, but no need for physical assistance, whereas the patient performed these at a higher level of independence PTA. Pt will benefit from skilled PT intervention to increase independence and safety with basic mobility in preparation for discharge to the venue listed below.       Follow Up Recommendations Home health PT([facility] pulmonary program? )    Equipment Recommendations  None recommended by PT    Recommendations for Other Services       Precautions /  Restrictions Precautions Precautions: Fall Restrictions Weight Bearing Restrictions: No      Mobility  Bed Mobility Overal bed mobility: Modified Independent             General bed mobility comments: *received up in chair  Transfers Overall transfer level: Modified independent Equipment used: None             General transfer comment: slow and cautious  Ambulation/Gait Ambulation/Gait assistance: Supervision;Min guard Gait Distance (Feet): 33 Feet(4x65ft on room air, HR: 120, SpO2: 87% ) Assistive device: None   Gait velocity: 0.47m/s  Gait velocity interpretation: <1.8 ft/sec, indicate of risk for recurrent falls    Stairs            Wheelchair Mobility    Modified Rankin (Stroke Patients Only)       Balance Overall balance assessment: Mild deficits observed, not formally tested;Modified Independent(pt is self limiting d/t dyspnea at baseline, but typically needs external objects for stability)                                           Pertinent Vitals/Pain Pain Assessment: No/denies pain    Home Living Family/patient expects to be discharged to:: Private residence Living Arrangements: Children(son, daughter-in-law, 2 grandchildren) Available Help at Discharge: Family;Available PRN/intermittently Type of Home: Mobile home Home Access: Stairs to enter Entrance Stairs-Rails: Can reach both;Left;Right Entrance Stairs-Number of Steps: 7 steps at the back (front is under Architect)  Home Layout: One level Home Equipment: [place] - 2 wheels;Bedside commode(intermittent RW use, sometimes  uses furniture for support) Additional Comments: keeps BSC over toilet, does not use RW unless she absolutely needs to    Prior Function Level of Independence: Independent with assistive device(s)         Comments: No falls history      Hand Dominance   Dominant Hand: Right    Extremity/Trunk Assessment   Upper Extremity  Assessment Upper Extremity Assessment: Overall WFL for tasks assessed    Lower Extremity Assessment Lower Extremity Assessment: Generalized weakness;Overall WFL for tasks assessed       Communication   Communication: No difficulties  Cognition Arousal/Alertness: Awake/alert Behavior During Therapy: WFL for tasks assessed/performed Overall Cognitive Status: Within Functional Limits for tasks assessed                                        General Comments      Exercises Other Exercises Other Exercises: gait interval training: 4x59ft in room on room air, 90sec seated recovery on 2L/min O2    Assessment/Plan    PT Assessment Patient needs continued PT services  PT Problem List Cardiopulmonary status limiting activity;Decreased mobility;Decreased activity tolerance;Decreased strength       PT Treatment Interventions Gait training;Functional mobility training;Therapeutic exercise;Therapeutic activities;Patient/family education    PT Goals (Current goals can be found in the Care Plan section)  Acute Rehab PT Goals Patient Stated Goal: return to home as is PT Goal Formulation: With patient Time For Goal Achievement: 10/29/17 Potential to Achieve Goals: Good    Frequency Min 3X/week   Barriers to discharge        Co-evaluation               AM-PAC PT "6 Clicks" Daily Activity  Outcome Measure Difficulty turning over in bed (including adjusting bedclothes, sheets and blankets)?: A Little Difficulty moving from lying on back to sitting on the side of the bed? : A Little Difficulty sitting down on and standing up from a chair with arms (e.g., wheelchair, bedside commode, etc,.)?: A Little Help needed moving to and from a bed to chair (including a wheelchair)?: A Little Help needed walking in hospital room?: A Little Help needed climbing 3-5 steps with a railing? : A Lot 6 Click Score: 17    End of Session Equipment Utilized During Treatment:  Oxygen Activity Tolerance: Patient tolerated treatment well;Treatment limited secondary to medical complications (Comment)(DOE, desat, tachycardia with short distance AMB) Patient left: in chair;with nursing/sitter in room;with call bell/phone within reach Nurse Communication: Mobility status PT Visit Diagnosis: Muscle weakness (generalized) (M62.81);Difficulty in walking, not elsewhere classified (R26.2)    Time: 3559-7416 PT Time Calculation (min) (ACUTE ONLY): 20 min   Charges:   PT Evaluation $PT Eval Moderate Complexity: 1 Mod PT Treatments $Therapeutic Activity: 8-22 mins        10:17 AM, 10/22/17 Etta Grandchild, PT, DPT Physical Therapist - [place] [phone] (Office)    Alaiza Yau C 10/22/2017, 10:15 AM

## 2017-10-23 LAB — GLUCOSE, CAPILLARY
GLUCOSE-CAPILLARY: 409 mg/dL — AB (ref 70–99)
GLUCOSE-CAPILLARY: 77 mg/dL (ref 70–99)
Glucose-Capillary: 283 mg/dL — ABNORMAL HIGH (ref 70–99)
Glucose-Capillary: 159 mg/dL — ABNORMAL HIGH (ref 70–99)
Glucose-Capillary: 95 mg/dL (ref 70–99)

## 2017-10-23 LAB — BASIC METABOLIC PANEL
Anion gap: 10 (ref 5–15)
BUN: 36 mg/dL — ABNORMAL HIGH (ref 8–23)
CALCIUM: 9 mg/dL (ref 8.9–10.3)
CHLORIDE: 101 mmol/L (ref 98–111)
CO2: 28 mmol/L (ref 22–32)
Creatinine, Ser: 1.86 mg/dL — ABNORMAL HIGH (ref 0.44–1.00)
GFR calc non Af Amer: 25 mL/min — ABNORMAL LOW (ref >60)
GFR, EST AFRICAN AMERICAN: 29 mL/min — AB (ref >60)
Glucose, Bld: 263 mg/dL — ABNORMAL HIGH (ref 70–99)
POTASSIUM: 4.3 mmol/L (ref 3.5–5.1)
Sodium: 139 mmol/L (ref 135–145)

## 2017-10-23 LAB — HIV ANTIBODY (ROUTINE TESTING W REFLEX): HIV Screen 4th Generation wRfx: NONREACTIVE

## 2017-10-23 LAB — MAGNESIUM: Magnesium: 2.2 mg/dL (ref 1.7–2.4)

## 2017-10-23 MED ORDER — INSULIN ASPART 100 UNIT/ML ~~LOC~~ SOLN
25.0000 [IU] | Freq: Once | SUBCUTANEOUS | Status: AC
  Administered 2017-10-23: 25 [IU] via SUBCUTANEOUS

## 2017-10-23 MED ORDER — INSULIN DETEMIR 100 UNIT/ML ~~LOC~~ SOLN
30.0000 [IU] | Freq: Every day | SUBCUTANEOUS | Status: DC
  Administered 2017-10-23: 30 [IU] via SUBCUTANEOUS
  Filled 2017-10-23 (×2): qty 0.3

## 2017-10-23 MED ORDER — POTASSIUM CHLORIDE CRYS ER 20 MEQ PO TBCR
20.0000 meq | EXTENDED_RELEASE_TABLET | Freq: Every day | ORAL | Status: DC
  Administered 2017-10-24: 20 meq via ORAL
  Filled 2017-10-23: qty 1

## 2017-10-23 MED ORDER — INSULIN ASPART 100 UNIT/ML ~~LOC~~ SOLN
10.0000 [IU] | Freq: Three times a day (TID) | SUBCUTANEOUS | Status: DC
  Administered 2017-10-24: 10 [IU] via SUBCUTANEOUS

## 2017-10-23 MED ORDER — FUROSEMIDE 40 MG PO TABS
60.0000 mg | ORAL_TABLET | Freq: Every day | ORAL | Status: DC
  Administered 2017-10-24: 60 mg via ORAL
  Filled 2017-10-23: qty 1

## 2017-10-23 NOTE — Progress Notes (Signed)
PROGRESS NOTE  Laura Best:[id number] DOB: 03/30/1940 DOA: 10/21/2017 PCP: Orlena Sheldon, PA-C  Brief History:  77 year old female with a history of CAD, CKD stage III, COPD, hyper lipidemia, hypertension, diastolic CHF, diabetes mellitus type 2 presenting with shortness of breath began on the morning of 10/12/2017.  Patient states that her symptoms progressed to the point of increasing coughing and wheezing.  In addition, the patient states that she has gained approximately 3 pounds in the past week.  She endorses compliance with her medications as well as her diet.  The patient has also complained of some increase in her lower extremity edema over the past week.  She quit smoking approximately 12 years ago.  She has 20-pack-year history of tobacco.  She denies any fevers, chills, nausea, vomiting, diarrhea, abdominal pain, dysuria. She saw Dr. Harl Bowie on 10/12/17 at which time she was instructed to increase lasix to 60 mg daily x 4 days, then back to 60mg  alternating with 40 mg.  Her weight was 175 during that office visit. In the emergency department, the patient was afebrile hemodynamically stable saturating 90% on room air.  Chest x-ray showed pulmonary venous congestion and chronic interstitial changes.  The pt was started on IV lasix and solumedrol.  Assessment/Plan: Acute Respiratory Failure with hypoxia -multifactorial including CHF exacerbation, COPD exacerbation in the setting of possible OSA/OHS -currently stable on 3L -wean oxygen for saturation > 92%  Acute on Chronic diastolic CHF -baseline weight around 170 according to previous cardiology notes -continue IV lasix>>>po lasix on 10/2 -daily weights--NEG 2.4 lbs -accurate I/Os--incomplete -continue coreg  COPD Exacerbation -continue Duonebs -continue pulmicort -continue IV solumedrol -check viral respiratory panel--rhino/enterovirus  Chronic atrial fibrillation -continue rivaroxaban -Continue  carvedilol  CKD stage III -Baseline creatinine 1.5-1.8 -Monitor with diuresis  Diabetes mellitus type 2, uncontrolled with hyperglycemia -Anticipate elevated CBG secondary to IV steroids -NovoLog sliding scale -Continue Levemir 20 units-->increase to 30 units -NovoLog 7 units with meals--increase to 10  Essential hypertension -Continue carvedilol, amlodipine, clonidine  Hyperlipidemia -continue statin    Disposition Plan:   Home in 1-2  days  Family Communication:  No Family at bedside  Consultants:  none  Code Status: DNR  DVT Prophylaxis:  Xarelto   Procedures: As Listed in Progress Note Above  Antibiotics: None  Subjective: Pt is breathing better, but continues to have chronic orthopnea.  Denies cp, n/v/d, abd pain, f/c.  No abd pain.  Objective: Vitals:   10/23/17 0546 10/23/17 0820 10/23/17 0827 10/23/17 0831  BP: 133/77 (!) 148/92    Pulse: 85 (!) 101    Resp: 18     Temp: 98.5 F (36.9 C)     TempSrc: Oral     SpO2: 94% 98% 96% 96%  Weight:      Height:        Intake/Output Summary (Last 24 hours) at 10/23/2017 1204 Last data filed at 10/23/2017 0814 Gross per 24 hour  Intake 720 ml  Output -  Net 720 ml   Weight change: -4.84 kg Exam:   General:  Pt is alert, follows commands appropriately, not in acute distress  HEENT: No icterus, No thrush, No neck mass, [place]/AT  Cardiovascular: RRR, S1/S2, no rubs, no gallops  Respiratory: bibasilar rales.  Bilateral expiratory wheeze.    Abdomen: Soft/+BS, non tender, non distended, no guarding  Extremities: trace LE edema, No lymphangitis, No petechiae, No rashes, no synovitis   Data Reviewed: I  have personally reviewed following labs and imaging studies Basic Metabolic Panel: Recent Labs  Lab 10/21/17 1506 10/22/17 0516 10/23/17 0502  NA 137 139 139  K 3.8 4.2 4.3  CL 101 103 101  CO2 24 26 28   GLUCOSE 212* 238* 263*  BUN 22 25* 36*  CREATININE 1.57* 1.72* 1.86*   CALCIUM 9.1 8.6* 9.0  MG 2.1  --  2.2   Liver Function Tests: Recent Labs  Lab 10/21/17 1506  AST 16  ALT 16  ALKPHOS 97  BILITOT 0.9  PROT 7.9  ALBUMIN 4.0   No results for input(s): LIPASE, AMYLASE in the last 168 hours. No results for input(s): AMMONIA in the last 168 hours. Coagulation Profile: No results for input(s): INR, PROTIME in the last 168 hours. CBC: Recent Labs  Lab 10/21/17 1506  WBC 6.6  NEUTROABS 4.3  HGB 14.1  HCT 43.4  MCV 87.0  PLT 202   Cardiac Enzymes: Recent Labs  Lab 10/21/17 1506  TROPONINI <0.03   BNP: Invalid input(s): POCBNP CBG: Recent Labs  Lab 10/22/17 1142 10/22/17 1657 10/22/17 2127 10/23/17 0750 10/23/17 1126  GLUCAP 317* 328* 267* 283* 409*   HbA1C: No results for input(s): HGBA1C in the last 72 hours. Urine analysis:    Component Value Date/Time   COLORURINE YELLOW 01/13/2017 [place] 01/13/2017 1253   LABSPEC 1.015 01/13/2017 1253   PHURINE 5.0 01/13/2017 1253   GLUCOSEU 50 (A) 01/13/2017 1253   HGBUR NEGATIVE 01/13/2017 1253   BILIRUBINUR NEGATIVE 01/13/2017 1253   KETONESUR 5 (A) 01/13/2017 1253   PROTEINUR 30 (A) 01/13/2017 1253   UROBILINOGEN 0.2 05/03/2014 0645   NITRITE NEGATIVE 01/13/2017 1253   LEUKOCYTESUR SMALL (A) 01/13/2017 1253   Sepsis Labs: @LABRCNTIP (procalcitonin:4,lacticidven:4) ) Recent Results (from the past 240 hour(s))  Respiratory Panel by PCR     Status: Abnormal   Collection Time: 10/22/17  6:00 PM  Result Value Ref Range Status   Adenovirus NOT DETECTED NOT DETECTED Final   Coronavirus 229E NOT DETECTED NOT DETECTED Final   Coronavirus HKU1 NOT DETECTED NOT DETECTED Final   Coronavirus NL63 NOT DETECTED NOT DETECTED Final   Coronavirus OC43 NOT DETECTED NOT DETECTED Final   Metapneumovirus NOT DETECTED NOT DETECTED Final   Rhinovirus / Enterovirus DETECTED (A) NOT DETECTED Final   Influenza A NOT DETECTED NOT DETECTED Final   Influenza B NOT DETECTED NOT  DETECTED Final   Parainfluenza Virus 1 NOT DETECTED NOT DETECTED Final   Parainfluenza Virus 2 NOT DETECTED NOT DETECTED Final   Parainfluenza Virus 3 NOT DETECTED NOT DETECTED Final   Parainfluenza Virus 4 NOT DETECTED NOT DETECTED Final   Respiratory Syncytial Virus NOT DETECTED NOT DETECTED Final   Bordetella pertussis NOT DETECTED NOT DETECTED Final   Chlamydophila pneumoniae NOT DETECTED NOT DETECTED Final   Mycoplasma pneumoniae NOT DETECTED NOT DETECTED Final    Comment: Performed at [facility], [place] [address], [place], [place] [address]     Scheduled Meds: . budesonide (PULMICORT) nebulizer solution  0.5 mg Nebulization BID  . calcitRIOL  0.25 mcg Oral Q M,W,F  . carvedilol  12.5 mg Oral BID WC  . cloNIDine  0.3 mg Oral BID  . febuxostat  40 mg Oral Daily  . feeding supplement (GLUCERNA SHAKE)  237 mL Oral Q24H  . furosemide  60 mg Intravenous BID  . insulin aspart  0-20 Units Subcutaneous TID WC  . insulin aspart  0-5 Units Subcutaneous QHS  .  insulin aspart  10 Units Subcutaneous TID WC  . insulin detemir  30 Units Subcutaneous QHS  . ipratropium-albuterol  3 mL Nebulization Q6H  . mouth rinse  15 mL Mouth Rinse BID  . methylPREDNISolone (SOLU-MEDROL) injection  60 mg Intravenous Q6H  . pantoprazole  40 mg Oral Daily  . potassium chloride  40 mEq Oral Daily  . Rivaroxaban  15 mg Oral q morning - 10a  . simvastatin  20 mg Oral QHS  . sodium chloride flush  3 mL Intravenous Q12H   Continuous Infusions: . sodium chloride      Procedures/Studies: Dg Chest Port 1 View  Result Date: 10/21/2017 CLINICAL DATA:  Shortness of breath with cough EXAM: PORTABLE CHEST 1 VIEW COMPARISON:  May 09, 2017 FINDINGS: There is no edema or consolidation. Heart is mildly enlarged with mild pulmonary venous hypertension. No adenopathy. There is aortic atherosclerosis. Patient is status post median sternotomy. No bone lesions evident. IMPRESSION: Pulmonary vascular congestion  without edema or consolidation. There is aortic atherosclerosis. Status post median sternotomy. Aortic Atherosclerosis (ICD10-I70.0). Electronically Signed   By: Lowella Grip III M.D.   On: 10/21/2017 15:36    Orson Eva, DO  [facility] Pager [phone]  If 7PM-7AM, please contact night-coverage [email or web address] Password [email or web address] 10/23/2017, 12:04 PM   LOS: 1 day

## 2017-10-23 NOTE — Progress Notes (Signed)
Inpatient Diabetes Program Recommendations  AACE/ADA: New Consensus Statement on Inpatient Glycemic Control (2019)  Target Ranges:  Prepandial:   less than 140 mg/dL      Peak postprandial:   less than 180 mg/dL (1-2 hours)      Critically ill patients:  140 - 180 mg/dL   Results for KRISA, BLATTNER (MRN [id number]) as of 10/23/2017 11:32  Ref. Range 10/22/2017 07:50 10/22/2017 11:42 10/22/2017 16:57 10/22/2017 21:27 10/23/2017 07:50  Glucose-Capillary Latest Ref Range: 70 - 99 mg/dL 242 (H) 317 (H) 328 (H) 267 (H) 283 (H)   Review of Glycemic Control  Diabetes history: DM2 Outpatient Diabetes medications: Levemir 22 units QHS, Novolog 7 units with lunch and supper Current orders for Inpatient glycemic control: Levemir 22 units QHS, Novolog 0-20 units TID with meals, Novolog 0-5 units QHS, Novolog 5 units TID with meals; Solumedrol 60 mg Q6H  Inpatient Diabetes Program Recommendations:  Insulin - Basal: If steroids are continued, please consider increasing Levemir to 25 units QHS. Insulin - Meal Coverage: If steroids are continued, please consider increasing meal coverage to Novolog 8 units TID with meals.  Thanks,  Barnie Alderman, RN, MSN, CDE Diabetes Coordinator Inpatient Diabetes Program [phone] (Team Pager from 8am to 5pm)

## 2017-10-24 DIAGNOSIS — J441 Chronic obstructive pulmonary disease with (acute) exacerbation: Secondary | ICD-10-CM

## 2017-10-24 DIAGNOSIS — N183 Chronic kidney disease, stage 3 (moderate): Secondary | ICD-10-CM

## 2017-10-24 DIAGNOSIS — J9601 Acute respiratory failure with hypoxia: Secondary | ICD-10-CM

## 2017-10-24 DIAGNOSIS — Z23 Encounter for immunization: Secondary | ICD-10-CM | POA: Diagnosis not present

## 2017-10-24 DIAGNOSIS — E1121 Type 2 diabetes mellitus with diabetic nephropathy: Secondary | ICD-10-CM

## 2017-10-24 DIAGNOSIS — I5033 Acute on chronic diastolic (congestive) heart failure: Secondary | ICD-10-CM

## 2017-10-24 DIAGNOSIS — I10 Essential (primary) hypertension: Secondary | ICD-10-CM

## 2017-10-24 DIAGNOSIS — K210 Gastro-esophageal reflux disease with esophagitis: Secondary | ICD-10-CM

## 2017-10-24 LAB — BASIC METABOLIC PANEL
Anion gap: 11 (ref 5–15)
BUN: 47 mg/dL — AB (ref 8–23)
CALCIUM: 9 mg/dL (ref 8.9–10.3)
CO2: 27 mmol/L (ref 22–32)
Chloride: 102 mmol/L (ref 98–111)
Creatinine, Ser: 1.87 mg/dL — ABNORMAL HIGH (ref 0.44–1.00)
GFR calc Af Amer: 29 mL/min — ABNORMAL LOW (ref >60)
GFR, EST NON AFRICAN AMERICAN: 25 mL/min — AB (ref >60)
GLUCOSE: 142 mg/dL — AB (ref 70–99)
POTASSIUM: 4.3 mmol/L (ref 3.5–5.1)
SODIUM: 140 mmol/L (ref 135–145)

## 2017-10-24 LAB — GLUCOSE, CAPILLARY
GLUCOSE-CAPILLARY: 235 mg/dL — AB (ref 70–99)
Glucose-Capillary: 184 mg/dL — ABNORMAL HIGH (ref 70–99)
Glucose-Capillary: 144 mg/dL — ABNORMAL HIGH (ref 70–99)

## 2017-10-24 MED ORDER — AMLODIPINE BESYLATE 10 MG PO TABS: 5.0000 mg | ORAL_TABLET | Freq: Every day | ORAL | Status: DC

## 2017-10-24 MED ORDER — PREDNISONE 20 MG PO TABS: ORAL_TABLET | ORAL | 0 refills | Status: DC

## 2017-10-24 MED ORDER — CLONIDINE HCL 0.2 MG PO TABS: 0.2000 mg | ORAL_TABLET | Freq: Two times a day (BID) | ORAL | Status: DC

## 2017-10-24 MED ORDER — FUROSEMIDE 40 MG PO TABS: ORAL_TABLET | ORAL | Status: DC

## 2017-10-24 MED ORDER — DOCUSATE SODIUM 100 MG PO CAPS: 100.0000 mg | ORAL_CAPSULE | Freq: Two times a day (BID) | ORAL | 1 refills | Status: AC

## 2017-10-24 MED ORDER — POLYETHYLENE GLYCOL 3350 17 G PO PACK: 17.0000 g | PACK | Freq: Every day | ORAL | 1 refills | Status: DC

## 2017-10-24 NOTE — Progress Notes (Signed)
Patient is to be discharged home and in stable condition. Patient given discharge instructions and verbalized understanding. Patient's IV and discharge instructions. Patient's oxygen at bedside. Patient is to be discharged out by staff via wheelchair.  Celestia Khat, RN

## 2017-10-24 NOTE — Care Management Note (Signed)
Case Management Note  Patient Details  Name: Laura Best MRN: [id number] Date of Birth: 1940-10-25   Expected Discharge Date:    10/24/17              Expected Discharge Plan:  [facility]  In-House Referral:  NA  Discharge planning Services  CM Consult  Post Acute Care Choice:  Durable Medical Equipment, Home Health Choice offered to:  Patient  DME Arranged:  Oxygen, Nebulizer machine DME Agency:  [place] Arranged:  RN, PT [facility] Agency:  [place]  Status of Service:  Completed, signed off  Additional Comments: Pt needs home oxygen and neb machine. Referrals given to [facility] per pt choice. Pt aware HH has 48 hrs to make first visit. Potential DC home later today.   Sherald Barge, RN 10/24/2017, 11:19 AM

## 2017-10-24 NOTE — Care Management Important Message (Signed)
Important Message  Patient Details  Name: THANYA CEGIELSKI MRN: [id number] Date of Birth: [birth year]   Medicare Important Message Given:  Yes  Gave letter to ncm,pt. On precaution.  Holli Humbles Smith 10/24/2017, 11:21 AM

## 2017-10-24 NOTE — Discharge Summary (Signed)
Physician Discharge Summary  Laura Best:[id number] DOB: 09/11/1940 DOA: 10/21/2017  PCP: Orlena Sheldon, PA-C  Admit date: 10/21/2017 Discharge date: 10/24/2017  Time spent: 35 minutes  Recommendations for Outpatient Follow-up:  Repeat BMET to follow electrolytes and renal function. Reassess volume status and adjust diuretics regimen. Re-evaluate needs of oxygen supplementation and continue weaning off to Room air process as tolerated. Reassess BP and adjust antihypertensive regimen as needed.  Discharge Diagnoses:  Principal Problem:   Acute on chronic respiratory failure with hypoxia (HCC) Active Problems:   Essential hypertension   Type 2 diabetes with nephropathy (HCC)   COPD exacerbation (HCC)   CKD (chronic kidney disease) stage 3, GFR 30-59 ml/min (HCC)   GERD (gastroesophageal reflux disease)   Diastolic CHF, acute on chronic (HCC)   Diabetic peripheral neuropathy associated with type 2 diabetes mellitus (HCC)   Persistent atrial fibrillation   Acute respiratory failure with hypoxia ([place])   Uncontrolled type 2 diabetes mellitus with hyperglycemia (HCC)   Atrial fibrillation, chronic   Acute on chronic respiratory failure ([place])   Discharge Condition: stable and improved. Patient discharge home with arranged oxygen supplementation and further ongoing treatment for COPD exacerbation. Patient instructed to follow up with PCP in 10 days.  Diet recommendation: heart healthy/low sodium diet.  Filed Weights   10/22/17 0423 10/23/17 0300 10/24/17 0300  Weight: 77 kg 75.9 kg 75.5 kg    History of present illness:  As per H&P written by Dr. Nehemiah Settle on 10/21/17 Laura Best is a 77 y.o. female with a history of coronary artery disease, CKD stage III, COPD, hyperlipidemia, hypertension, diastolic heart failure with preserved LVEF, diabetes.  Patient began having short of breath last night that has been increasing since this morning.  Also has increased cough with purulent  sputum production.  Also has increased peripheral edema over the past couple of days.  Breathing worse with exertion and improved with rest.  Also improved with nebulizer treatments.  No fevers, chills, nausea, chest pain.  She does have orthopnea, appears to be chronic.  Emergency Department Course: Chest x-ray shows vascular congestion no pulmonary edema. BNP 398.  Patient received steroids, nebulizer treatments, Lasix, Augmentin.  Patient vomited shortly after taking Augmentin  Hospital Course:  1-acute resp failure with hypoxia -in the setting of CHFa nd COPD exacerbation. -after improvement in COPD and completion of CHF treatment; patient was still requiring oxygen supplementation. -advised to follow low sodium diet and to take medications as prescribed  -outpatient follow up with PCP and cardiology service. -patient lasix dose adjusted and patient advised to follow low sodium diet. Patient will complete steroid tapering and continue inhaler/nebulizer treatment.  2-acute on chronic diastolic HF -echo reassuring -lasix dose adjusted -no further inpatient work up needed from cardiology stand point. -patient advised to check weight on daily basis and to follow low sodium diet.  3-COPD exacerbation -treated with IV steroids, Duoneb, pulmicort and oxygen supplementation. -breathing overall improved and capable of speaking in full sentences. Unfortunately patient was still requiring O2 supplementation at discharge.  4-CKD stage 3 -baseline Cr 1.5-1.8 -remains at baseline  -patient advised to keep herself well hydrated -recommending repeat BMET at follow up visit to assess renal function trend and electrolytes.  5-type 2 diabetes with nephropathy -CBG's higher than at baseline due to ongoing use of steroids. -advised to follow low carb diet -resume home hypoglycemic regimen.  6-HTN -stable overall -resume home antihypertensive regimen.  7-HLD -continue statins  8-chronic  atrial fibrillation -rate  controlled -continue xarelto  9-gout -n acute flare ppreciated -continue Uloric for prevention.   Procedures:  See below for x-ray reports.  2-D echo: - Left ventricle: The cavity size was normal. Wall thickness was   increased increased in a pattern of mild to moderate LVH.   Systolic function was normal. The estimated ejection fraction was   in the range of 55% to 60%. Wall motion was normal; there were no   regional wall motion abnormalities. The study is not technically   sufficient to allow evaluation of LV diastolic function. - Aortic valve: Mildly calcified annulus. Trileaflet; mildly   thickened leaflets. Valve area (VTI): 2.42 cm^2. Valve area   (Vmax): 2.42 cm^2. - Mitral valve: Mildly calcified annulus. Mildly thickened leaflets. - Left atrium: The atrium was severely dilated. - Right ventricle: The cavity size was mildly dilated. Systolic   function was mildly reduced. - Right atrium: The atrium was severely dilated. - Atrial septum: No defect or patent foramen ovale was identified. - Pulmonary arteries: Systolic pressure was mildly increased. PA   peak pressure: 33 mm Hg (S). - Technically  adequate study.   Consultations:  Cardiology   Discharge Exam: Vitals:   10/24/17 1330 10/24/17 1432  BP: (!) 144/79   Pulse: 81   Resp: 18   Temp: 97.9 F (36.6 C)   SpO2: 93% 93%    General: wearing 2L [place], denying CP and speaking in almost full sentences. Patient expressed feeling significantly better and will like to go home. Cardiovascular: S1 and S2, no rubs, no gallops. Respiratory: scattered exp wheezing and rhonchi appreciated on exam.  Abdomen: no pain, no  Extremities: no edema, no cyanosis.  Discharge Instructions   Discharge Instructions    (HEART FAILURE PATIENTS) Call MD:  Anytime you have any of the following symptoms: 1) 3 pound weight gain in 24 hours or 5 pounds in 1 week 2) shortness of breath, with or without a  dry hacking cough 3) swelling in the hands, feet or stomach 4) if you have to sleep on extra pillows at night in order to breathe.   Complete by:  As directed    Diet - low sodium heart healthy   Complete by:  As directed    Discharge instructions   Complete by:  As directed    Take medications as prescribed  Arrange follow up with PCP in 10 days Keep yourself well hydrated Follow low sodium diet Check weight on daily basis     Allergies as of 10/24/2017      Reactions   Ace Inhibitors Other (See Comments)   Hyperkalemia   Actos [pioglitazone] Swelling   Aspirin Other (See Comments)   G.I. Upset. In high doses   Metformin And Related Diarrhea   Eliquis [apixaban] Rash, Other (See Comments)   Patient states that this med was d/ced by prescriber due to interference with allergies and states that she also had a breakout      Medication List    TAKE these medications   acetaminophen 500 MG tablet Commonly known as:  TYLENOL Take 500 mg by mouth every 6 (six) hours as needed for pain.   amLODipine 10 MG tablet Commonly known as:  NORVASC Take 0.5 tablets (5 mg total) by mouth daily. What changed:  how much to take   Blood Glucose Monitoring Suppl Kit Use to check blood sugar  Three times daily before meals and at bedtime.ICD10: E11.8.Please dispense based on insurance preference   ACCU-CHEK GUIDE  w/Device Kit USE AS DIRECTED   calcitRIOL 0.25 MCG capsule Commonly known as:  ROCALTROL Take 1 capsule (0.25 mcg total) by mouth every Monday, Wednesday, and Friday.   carvedilol 12.5 MG tablet Commonly known as:  COREG Take 1 tablet (12.5 mg total) by mouth 2 (two) times daily.   cloNIDine 0.2 MG tablet Commonly known as:  CATAPRES Take 1 tablet (0.2 mg total) by mouth 2 (two) times daily. What changed:    medication strength  how much to take   docusate sodium 100 MG capsule Commonly known as:  COLACE Take 1 capsule (100 mg total) by mouth 2 (two) times daily.    febuxostat 40 MG tablet Commonly known as:  ULORIC Take 1 tablet (40 mg total) by mouth daily.   furosemide 40 MG tablet Commonly known as:  LASIX Take 60 mg daily What changed:  additional instructions   glucose blood test strip Use as directed. Check blood sugar 3 times daily before meals and at bedtime.   insulin aspart 100 UNIT/ML FlexPen Commonly known as:  NOVOLOG INJECT 7 UNITS SUBCUTANEOUSLY WITH LUNCH AND SUPPER. INJECT 15 MINUTES PRIOR TO MEAL OR IMMEDIATELY AFTER MEAL. What changed:    how much to take  how to take this  when to take this   Insulin Detemir 100 UNIT/ML Pen Commonly known as:  LEVEMIR INJECT 22 UNITS SUBCUTANEOUSLY ONCE DAILY AT  10  PM What changed:    how much to take  how to take this  when to take this  additional instructions   omeprazole 20 MG capsule Commonly known as:  PRILOSEC Take 1 capsule (20 mg total) by mouth daily.   onetouch ultrasoft lancets Use as instructed   accu-chek soft touch lancets Use to check blood sugar ICD10:e11.8 Dispense based on insurance preference   polyethylene glycol packet Commonly known as:  MIRALAX / GLYCOLAX Take 17 g by mouth daily. Hold for diarrhea.   predniSONE 20 MG tablet Commonly known as:  DELTASONE Take 3 tablets by mouth daily X2 days; then 2 tablets by mouth daily X2 days; take 1 tablet by mouth daily X3 days; then 1/2 tablet by mouth daily X3 days and then stop prednisone.   psyllium 95 % Pack Commonly known as:  HYDROCIL/METAMUCIL Take 1 packet by mouth 3 (three) times daily.   RELION SHORT PEN NEEDLES 31G X 8 MM Misc Generic drug:  Insulin Pen Needle USE ONE PEN NEEDLE ONCE DAILY AS DIRECTED   BD PEN NEEDLE MICRO U/F 32G X 6 MM Misc Generic drug:  Insulin Pen Needle USE AS DIRECTED THREE TIMES DAILY   Rivaroxaban 15 MG Tabs tablet Commonly known as:  XARELTO Take 1 tablet (15 mg total) by mouth daily. What changed:  when to take this   simvastatin 20 MG  tablet Commonly known as:  ZOCOR TAKE ONE TABLET ('20MG'$ ) BY MOUTH ONCE DAILY AT BEDTIME What changed:    how much to take  how to take this  when to take this  additional instructions   Vitamin D 2000 units Caps Take 2,000 Units by mouth daily.            Durable Medical Equipment  (From admission, onward)         Start     Ordered   10/24/17 1119  For home use only DME oxygen  Once    Question Answer Comment  Mode or (Route) Nasal cannula   Liters per Minute 3   Frequency Continuous (  stationary and portable oxygen unit needed)   Oxygen delivery system Gas      10/24/17 1118   10/24/17 1119  For home use only DME Nebulizer machine  Once    Question:  Patient needs a nebulizer to treat with the following condition  Answer:  COPD (chronic obstructive pulmonary disease) ([place])   10/24/17 1118         Allergies  Allergen Reactions  . Ace Inhibitors Other (See Comments)    Hyperkalemia  . Actos [Pioglitazone] Swelling  . Aspirin Other (See Comments)    G.I. Upset. In high doses  . Metformin And Related Diarrhea  . Eliquis [Apixaban] Rash and Other (See Comments)    Patient states that this med was d/ced by prescriber due to interference with allergies and states that she also had a breakout   Follow-up Information    Orlena Sheldon, PA-C. Schedule an appointment as soon as possible for a visit in 10 day(s).   Specialty:  Physician Assistant Contact information: [address] [place] [address] [phone]        Arnoldo Lenis, MD .   Specialty:  Cardiology Contact information: [address] [place] [address] [phone]            The results of significant diagnostics from this hospitalization (including imaging, microbiology, ancillary and laboratory) are listed below for reference.    Significant Diagnostic Studies: Dg Chest Port 1 View  Result Date: 10/21/2017 CLINICAL DATA:  Shortness of breath with cough EXAM:  PORTABLE CHEST 1 VIEW COMPARISON:  May 09, 2017 FINDINGS: There is no edema or consolidation. Heart is mildly enlarged with mild pulmonary venous hypertension. No adenopathy. There is aortic atherosclerosis. Patient is status post median sternotomy. No bone lesions evident. IMPRESSION: Pulmonary vascular congestion without edema or consolidation. There is aortic atherosclerosis. Status post median sternotomy. Aortic Atherosclerosis (ICD10-I70.0). Electronically Signed   By: Lowella Grip III M.D.   On: 10/21/2017 15:36    Microbiology: Recent Results (from the past 240 hour(s))  Respiratory Panel by PCR     Status: Abnormal   Collection Time: 10/22/17  6:00 PM  Result Value Ref Range Status   Adenovirus NOT DETECTED NOT DETECTED Final   Coronavirus 229E NOT DETECTED NOT DETECTED Final   Coronavirus HKU1 NOT DETECTED NOT DETECTED Final   Coronavirus NL63 NOT DETECTED NOT DETECTED Final   Coronavirus OC43 NOT DETECTED NOT DETECTED Final   Metapneumovirus NOT DETECTED NOT DETECTED Final   Rhinovirus / Enterovirus DETECTED (A) NOT DETECTED Final   Influenza A NOT DETECTED NOT DETECTED Final   Influenza B NOT DETECTED NOT DETECTED Final   Parainfluenza Virus 1 NOT DETECTED NOT DETECTED Final   Parainfluenza Virus 2 NOT DETECTED NOT DETECTED Final   Parainfluenza Virus 3 NOT DETECTED NOT DETECTED Final   Parainfluenza Virus 4 NOT DETECTED NOT DETECTED Final   Respiratory Syncytial Virus NOT DETECTED NOT DETECTED Final   Bordetella pertussis NOT DETECTED NOT DETECTED Final   Chlamydophila pneumoniae NOT DETECTED NOT DETECTED Final   Mycoplasma pneumoniae NOT DETECTED NOT DETECTED Final    Comment: Performed at [facility], [place] [address] [place],  [address]     Labs: Basic Metabolic Panel: Recent Labs  Lab 10/21/17 1506 10/22/17 0516 10/23/17 0502 10/24/17 0439  NA 137 139 139 140  K 3.8 4.2 4.3 4.3  CL 101 103 101 102  CO2 '24 26 28 27  '$ GLUCOSE 212* 238*  263*  142*  BUN 22 25* 36* 47*  CREATININE 1.57* 1.72* 1.86* 1.87*  CALCIUM 9.1 8.6* 9.0 9.0  MG 2.1  --  2.2  --    Liver Function Tests: Recent Labs  Lab 10/21/17 1506  AST 16  ALT 16  ALKPHOS 97  BILITOT 0.9  PROT 7.9  ALBUMIN 4.0   No results for input(s): LIPASE, AMYLASE in the last 168 hours. No results for input(s): AMMONIA in the last 168 hours. CBC: Recent Labs  Lab 10/21/17 1506  WBC 6.6  NEUTROABS 4.3  HGB 14.1  HCT 43.4  MCV 87.0  PLT 202   Cardiac Enzymes: Recent Labs  Lab 10/21/17 1506  TROPONINI <0.03   BNP: BNP (last 3 results) Recent Labs    01/13/17 1526 10/21/17 1506  BNP 450.0* 398.0*    ProBNP (last 3 results) No results for input(s): PROBNP in the last 8760 hours.  CBG: Recent Labs  Lab 10/23/17 1632 10/23/17 2111 10/23/17 2137 10/24/17 0745 10/24/17 1158  GLUCAP 159* 77 95 184* 235*       Signed:  Barton Dubois MD.  [facility] 10/24/2017, 2:49 PM

## 2017-10-24 NOTE — Progress Notes (Signed)
SATURATION QUALIFICATIONS: (This note is used to comply with regulatory documentation for home oxygen)  Patient Saturations on Room Air at Rest = 95%  Patient Saturations on Room Air while Ambulating = 84%  Patient Saturations on 3 Liters of oxygen while Ambulating = 93%  Please briefly explain why patient needs home oxygen: Patient is unable to maintain oxygen saturation while ambulating without oxygen.  Celestia Khat, RN

## 2017-10-25 DIAGNOSIS — I5033 Acute on chronic diastolic (congestive) heart failure: Secondary | ICD-10-CM | POA: Diagnosis not present

## 2017-10-25 DIAGNOSIS — J449 Chronic obstructive pulmonary disease, unspecified: Secondary | ICD-10-CM | POA: Diagnosis not present

## 2017-10-25 DIAGNOSIS — J209 Acute bronchitis, unspecified: Secondary | ICD-10-CM | POA: Diagnosis not present

## 2017-10-25 DIAGNOSIS — I509 Heart failure, unspecified: Secondary | ICD-10-CM | POA: Diagnosis not present

## 2017-10-25 DIAGNOSIS — I5031 Acute diastolic (congestive) heart failure: Secondary | ICD-10-CM | POA: Diagnosis not present

## 2017-10-25 DIAGNOSIS — E1165 Type 2 diabetes mellitus with hyperglycemia: Secondary | ICD-10-CM | POA: Diagnosis not present

## 2017-10-25 DIAGNOSIS — A4189 Other specified sepsis: Secondary | ICD-10-CM | POA: Diagnosis not present

## 2017-10-25 DIAGNOSIS — J441 Chronic obstructive pulmonary disease with (acute) exacerbation: Secondary | ICD-10-CM | POA: Diagnosis not present

## 2017-10-25 DIAGNOSIS — I2783 Eisenmenger's syndrome: Secondary | ICD-10-CM | POA: Diagnosis not present

## 2017-10-29 ENCOUNTER — Ambulatory Visit (INDEPENDENT_AMBULATORY_CARE_PROVIDER_SITE_OTHER): Payer: Medicare HMO | Admitting: Physician Assistant

## 2017-10-29 ENCOUNTER — Telehealth: Payer: Self-pay

## 2017-10-29 ENCOUNTER — Encounter: Payer: Self-pay | Admitting: Physician Assistant

## 2017-10-29 VITALS — BP 134/72 | HR 94 | Temp 98.3°F | Resp 18 | Ht 63.0 in | Wt 168.4 lb

## 2017-10-29 DIAGNOSIS — J9621 Acute and chronic respiratory failure with hypoxia: Secondary | ICD-10-CM | POA: Diagnosis not present

## 2017-10-29 DIAGNOSIS — I5032 Chronic diastolic (congestive) heart failure: Secondary | ICD-10-CM

## 2017-10-29 DIAGNOSIS — N183 Chronic kidney disease, stage 3 unspecified: Secondary | ICD-10-CM

## 2017-10-29 DIAGNOSIS — E1121 Type 2 diabetes mellitus with diabetic nephropathy: Secondary | ICD-10-CM

## 2017-10-29 DIAGNOSIS — J441 Chronic obstructive pulmonary disease with (acute) exacerbation: Secondary | ICD-10-CM | POA: Diagnosis not present

## 2017-10-29 DIAGNOSIS — Z09 Encounter for follow-up examination after completed treatment for conditions other than malignant neoplasm: Secondary | ICD-10-CM | POA: Diagnosis not present

## 2017-10-29 MED ORDER — BUDESONIDE 0.5 MG/2ML IN SUSP: 0.5000 mg | Freq: Two times a day (BID) | RESPIRATORY_TRACT | 11 refills | Status: DC

## 2017-10-29 MED ORDER — IPRATROPIUM-ALBUTEROL 0.5-2.5 (3) MG/3ML IN SOLN: 3.0000 mL | Freq: Four times a day (QID) | RESPIRATORY_TRACT | 5 refills | Status: AC | PRN

## 2017-10-29 MED ORDER — IPRATROPIUM-ALBUTEROL 0.5-2.5 (3) MG/3ML IN SOLN
3.0000 mL | Freq: Once | RESPIRATORY_TRACT | Status: AC
  Administered 2017-10-29: 3 mL via RESPIRATORY_TRACT

## 2017-10-29 NOTE — Progress Notes (Signed)
Patient ID: LYNANN DEMETRIUS MRN: [id number], DOB: 1940-07-15, 77 y.o. Date of Encounter: '@DATE'$ @  Chief Complaint:  Chief Complaint  Patient presents with  . Hospitalization Follow-up    HPI: 77 y.o. year old female  presents for follow up after recent hospitalization.  ------------------------------------------------------------------------------------------------------------------------------------------------------------------------------------------------------------------------------------------------ THE FOLLOWING IS COPIED [place]: Admit date: 10/21/2017 Discharge date: 10/24/2017  Time spent: 35 minutes  Recommendations for Outpatient Follow-up:  Repeat BMET to follow electrolytes and renal function. Reassess volume status and adjust diuretics regimen. Re-evaluate needs of oxygen supplementation and continue weaning off to Room air process as tolerated. Reassess BP and adjust antihypertensive regimen as needed.  Discharge Diagnoses:  Principal Problem:   Acute on chronic respiratory failure with hypoxia (HCC) Active Problems:   Essential hypertension   Type 2 diabetes with nephropathy (HCC)   COPD exacerbation (HCC)   CKD (chronic kidney disease) stage 3, GFR 30-59 ml/min (HCC)   GERD (gastroesophageal reflux disease)   Diastolic CHF, acute on chronic (HCC)   Diabetic peripheral neuropathy associated with type 2 diabetes mellitus (HCC)   Persistent atrial fibrillation   Acute respiratory failure with hypoxia ([place])   Uncontrolled type 2 diabetes mellitus with hyperglycemia (HCC)   Atrial fibrillation, chronic   Acute on chronic respiratory failure ([place])  History of present illness:  As per H&P written by Dr. Nehemiah Settle on 10/21/17 [name] a 77 y.o.femalewith a history of coronary artery disease, CKD stage III, COPD, hyperlipidemia, hypertension, diastolic heart failure with preserved LVEF,diabetes.Patient began having short of  breath last night that has been increasing since this morning. Also has increased cough with purulent sputum production. Also has increased peripheral edema over the past couple of days. Breathing worse with exertion and improved with rest. Also improved with nebulizer treatments.No fevers, chills, nausea, chest pain.She does have orthopnea, appears to be chronic.  Emergency Department Course: Chest x-ray shows vascular congestion no pulmonary edema. BNP 398. Patient received steroids, nebulizer treatments, Lasix, Augmentin. Patient vomited shortly after taking Augmentin  Hospital Course:  1-acute resp failure with hypoxia -in the setting of CHFa nd COPD exacerbation. -after improvement in COPD and completion of CHF treatment; patient was still requiring oxygen supplementation. -advised to follow low sodium diet and to take medications as prescribed  -outpatient follow up with PCP and cardiology service. -patient lasix dose adjusted and patient advised to follow low sodium diet. Patient will complete steroid tapering and continue inhaler/nebulizer treatment.  2-acute on chronic diastolic HF -echo reassuring -lasix dose adjusted -no further inpatient work up needed from cardiology stand point. -patient advised to check weight on daily basis and to follow low sodium diet.  3-COPD exacerbation -treated with IV steroids, Duoneb, pulmicort and oxygen supplementation. -breathing overall improved and capable of speaking in full sentences. Unfortunately patient was still requiring O2 supplementation at discharge.  4-CKD stage 3 -baseline Cr 1.5-1.8 -remains at baseline  -patient advised to keep herself well hydrated -recommending repeat BMET at follow up visit to assess renal function trend and electrolytes.  5-type 2 diabetes with nephropathy -CBG's higher than at baseline due to ongoing use of steroids. -advised to follow low carb diet -resume home hypoglycemic  regimen.  6-HTN -stable overall -resume home antihypertensive regimen.  7-HLD -continue statins  8-chronic atrial fibrillation -rate controlled -continue xarelto  9-gout -n acute flare ppreciated -continue Uloric for prevention.  ------------------------------------------------------------------------------------------------------------------------------------------------------------------------------------------------------------------------------------------------   TODAY-----10/29/2017:  She does not have on any nasal cannula oxygen here in the office.   She reports  that she has been using oxygen at home--- just did not bother to bring it out to the office.   States that she lives close by and knew she could be back at home on her oxygen soon.   Says that she has been using it at 2 L when at rest and puts it at 3 L when she is active.  She states that she needs me to send in nebulizer medicine to the pharmacy.   Says that "[facility] has brought her a nebulizer machine, but she has no medication to put in for nebulizer treatment." Says that she "has had no nebulized treatments since being at the hospital."  She has the prednisone bottle with her.  Notes that she is still on the prednisone taper.  I can hear a gurgly sound with her breathing even across the room.   I asked her how she is feeling in comparison to being at the hospital.   Says that she "is a little bit better--- little bit at the time"    Past Medical History:  Diagnosis Date  . Arthritis    "hands, feet" (02/17/2016)  . CAD (coronary artery disease)    a. 05/2016: cath showing nonobstructive CAD with 50% prox-Cx stenosis  . CKD (chronic kidney disease) stage 3, GFR 30-59 ml/min (HCC) 12/25/2012  . COPD (chronic obstructive pulmonary disease) ([place])   . Diastolic heart failure ([place])   . Hyperlipidemia   . Hypertension   . Osteoporosis   . Pneumonia    "several times" (02/17/2016)  . Small bowel  obstruction ([place]) 02/17/2016  . Tricuspid regurgitation    Echo 05/02/06-Nml LV. Mild MR. Mod TR  . Type II diabetes mellitus ([place])   . Vitamin D deficiency      Home Meds: Outpatient Medications Prior to Visit  Medication Sig Dispense Refill  . acetaminophen (TYLENOL) 500 MG tablet Take 500 mg by mouth every 6 (six) hours as needed for pain.    Marland Kitchen amLODipine (NORVASC) 10 MG tablet Take 0.5 tablets (5 mg total) by mouth daily.    . BD PEN NEEDLE MICRO U/F 32G X 6 MM MISC USE AS DIRECTED THREE TIMES DAILY 300 each 2  . Blood Glucose Monitoring Suppl (ACCU-CHEK GUIDE) w/Device KIT USE AS DIRECTED 1 kit 0  . Blood Glucose Monitoring Suppl KIT Use to check blood sugar  Three times daily before meals and at bedtime.ICD10: E11.8.Please dispense based on insurance preference 1 each 0  . calcitRIOL (ROCALTROL) 0.25 MCG capsule Take 1 capsule (0.25 mcg total) by mouth every Monday, Wednesday, and Friday. 90 capsule 1  . Cholecalciferol (VITAMIN D) 2000 UNITS CAPS Take 2,000 Units by mouth daily.     . cloNIDine (CATAPRES) 0.2 MG tablet Take 1 tablet (0.2 mg total) by mouth 2 (two) times daily.    Marland Kitchen docusate sodium (COLACE) 100 MG capsule Take 1 capsule (100 mg total) by mouth 2 (two) times daily. 60 capsule 1  . febuxostat (ULORIC) 40 MG tablet Take 1 tablet (40 mg total) by mouth daily. 90 tablet 0  . furosemide (LASIX) 40 MG tablet Take 60 mg daily    . glucose blood test strip Use as directed. Check blood sugar 3 times daily before meals and at bedtime. 100 each 12  . insulin aspart (NOVOLOG FLEXPEN) 100 UNIT/ML FlexPen INJECT 7 UNITS SUBCUTANEOUSLY WITH LUNCH AND SUPPER. INJECT 15 MINUTES PRIOR TO MEAL OR IMMEDIATELY AFTER MEAL. (Patient taking differently: Inject 7 Units into the skin 2 (two)  times daily. INJECT 7 UNITS SUBCUTANEOUSLY WITH LUNCH AND SUPPER. INJECT 15 MINUTES PRIOR TO MEAL OR IMMEDIATELY AFTER MEAL.) 15 pen 11  . Insulin Detemir (LEVEMIR FLEXTOUCH) 100 UNIT/ML Pen INJECT 22 UNITS  SUBCUTANEOUSLY ONCE DAILY AT  10  PM (Patient taking differently: Inject 22 Units into the skin at bedtime. SUBCUTANEOUSLY ONCE DAILY AT  10  PM) 15 pen 2  . Lancets (ACCU-CHEK SOFT TOUCH) lancets Use to check blood sugar ICD10:e11.8 Dispense based on insurance preference 100 each 12  . Lancets (ONETOUCH ULTRASOFT) lancets Use as instructed 100 each 12  . omeprazole (PRILOSEC) 20 MG capsule Take 1 capsule (20 mg total) by mouth daily. 90 capsule 3  . polyethylene glycol (MIRALAX) packet Take 17 g by mouth daily. Hold for diarrhea. 28 each 1  . predniSONE (DELTASONE) 20 MG tablet Take 3 tablets by mouth daily X2 days; then 2 tablets by mouth daily X2 days; take 1 tablet by mouth daily X3 days; then 1/2 tablet by mouth daily X3 days and then stop prednisone. 16 tablet 0  . psyllium (HYDROCIL/METAMUCIL) 95 % PACK Take 1 packet by mouth 3 (three) times daily. 56 each 0  . RELION SHORT PEN NEEDLES 31G X 8 MM MISC USE ONE PEN NEEDLE ONCE DAILY AS DIRECTED 50 each 11  . Rivaroxaban (XARELTO) 15 MG TABS tablet Take 1 tablet (15 mg total) by mouth daily. (Patient taking differently: Take 15 mg by mouth every morning. ) 30 tablet 6  . simvastatin (ZOCOR) 20 MG tablet TAKE ONE TABLET ('20MG'$ ) BY MOUTH ONCE DAILY AT BEDTIME (Patient taking differently: Take 20 mg by mouth at bedtime. ) 90 tablet 3  . carvedilol (COREG) 12.5 MG tablet Take 1 tablet (12.5 mg total) by mouth 2 (two) times daily. 180 tablet 3   No facility-administered medications prior to visit.     Allergies:  Allergies  Allergen Reactions  . Ace Inhibitors Other (See Comments)    Hyperkalemia  . Actos [Pioglitazone] Swelling  . Aspirin Other (See Comments)    G.I. Upset. In high doses  . Metformin And Related Diarrhea  . Eliquis [Apixaban] Rash and Other (See Comments)    Patient states that this med was d/ced by prescriber due to interference with allergies and states that she also had a breakout    Social History   Socioeconomic  History  . Marital status: Widowed    Spouse name: Not on file  . Number of children: Not on file  . Years of education: Not on file  . Highest education level: Not on file  Occupational History  . Occupation: retired  Scientific laboratory technician  . Financial resource strain: Not on file  . Food insecurity:    Worry: Not on file    Inability: Not on file  . Transportation needs:    Medical: Not on file    Non-medical: Not on file  Tobacco Use  . Smoking status: Former Smoker    Packs/day: 0.50    Years: 50.00    Pack years: 25.00    Types: Cigarettes    Last attempt to quit: 01/19/2009    Years since quitting: 8.7  . Smokeless tobacco: Never Used  Substance and Sexual Activity  . Alcohol use: No    Alcohol/week: 0.0 standard drinks  . Drug use: No  . Sexual activity: Never  Lifestyle  . Physical activity:    Days per week: Not on file    Minutes per session: Not on file  .  Stress: Not on file  Relationships  . Social connections:    Talks on phone: Not on file    Gets together: Not on file    Attends religious service: Not on file    Active member of club or organization: Not on file    Attends meetings of clubs or organizations: Not on file    Relationship status: Not on file  . Intimate partner violence:    Fear of current or ex partner: Not on file    Emotionally abused: Not on file    Physically abused: Not on file    Forced sexual activity: Not on file  Other Topics Concern  . Not on file  Social History Narrative   Entered 10/2013:   She has never driven.   She lives with her son.    Family History  Problem Relation Age of Onset  . Cancer Father   . Diabetes Father   . Cancer Brother        Brain  . Cancer Sister        abdominal fat     Review of Systems:  See HPI for pertinent ROS. All other ROS negative.    Physical Exam: Blood pressure 134/72, pulse 94, temperature 98.3 F (36.8 C), temperature source Oral, resp. rate 18, height '5\' 3"'$  (1.6 m), weight  76.4 kg, SpO2 97 %., Body mass index is 29.83 kg/m. General: WNWD WF. Appears in no acute distress. Neck: Supple. No thyromegaly. No lymphadenopathy. Lungs:, Harsh, coarse breath sounds throughout bilaterally. After Neb treatment: Lungs sound clearer, better air movement, clearer. Heart: Irregular. Abdomen: Soft, non-tender, non-distended with normoactive bowel sounds. No hepatomegaly. No rebound/guarding. No obvious abdominal masses. Musculoskeletal:  Strength and tone normal for age. Extremities/Skin: Warm and dry. 1+ LE edema bilaterally.  Neuro: Alert and oriented X 3. Moves all extremities spontaneously. Gait is normal. CNII-XII grossly in tact. Psych:  Responds to questions appropriately with a normal affect.     ASSESSMENT AND PLAN:  77 y.o. year old female with   1. Hospital discharge follow-up Given 1 DuoNeb nebulized treatment here in the office. At this time, I am sending the prescriptions for the nebulizer medications to the local pharmacy -- [facility].  They are to pick up the prescriptions today and start using these nebulized treatments as directed. If her breathing worsens, she needs to follow-up with [place] or the ED immediately. Otherwise I am going to have her return here in 2 days for repeat visit to re-examine her and recheck her lungs.  Concerned that once she is off of prednisone may decompensate. Also check bmet to f/u labs.  I did recheck her oxygen saturation level myself and it is reading 95% on room air. - BASIC METABOLIC PANEL WITH GFR - ipratropium-albuterol (DUONEB) 0.5-2.5 (3) MG/3ML nebulizer solution 3 mL - ipratropium-albuterol (DUONEB) 0.5-2.5 (3) MG/3ML SOLN; Take 3 mLs by nebulization every 6 (six) hours as needed.  Dispense: 360 mL; Refill: 5 - budesonide (PULMICORT) 0.5 MG/2ML nebulizer solution; Take 2 mLs (0.5 mg total) by nebulization 2 (two) times daily.  Dispense: 120 mL; Refill: 11  2. COPD exacerbation ([place]) See # 1 - BASIC METABOLIC PANEL  WITH GFR  3. Acute on chronic respiratory failure with hypoxia (HCC) See # 1  - BASIC METABOLIC PANEL WITH GFR  4. Chronic diastolic congestive heart failure (HCC) Weight is stable compared to weights in the hospital.  She has 1+ lower extremity edema.  Will check be met. - BASIC  METABOLIC PANEL WITH GFR  5. Type 2 diabetes with nephropathy (HCC) - BASIC METABOLIC PANEL WITH GFR  6. CKD (chronic kidney disease) stage 3, GFR 30-59 ml/min (HCC) Recheck lab to monitor. - BASIC METABOLIC PANEL WITH GFR  - ipratropium-albuterol (DUONEB) 0.5-2.5 (3) MG/3ML nebulizer solution 3 mL - ipratropium-albuterol (DUONEB) 0.5-2.5 (3) MG/3ML SOLN; Take 3 mLs by nebulization every 6 (six) hours as needed.  Dispense: 360 mL; Refill: 5 - budesonide (PULMICORT) 0.5 MG/2ML nebulizer solution; Take 2 mLs (0.5 mg total) by nebulization 2 (two) times daily.  Dispense: 120 mL; Refill: 11   She will schedule follow-up visit with me in 2 days.  Follow-up sooner if needed.   Signed, [address] [place], [place], BSFM 10/29/2017 2:20 PM

## 2017-10-29 NOTE — Telephone Encounter (Signed)
Received fax on 9/62019 requesting form be filled out for patient to get a Freestyle Libre 14 day glucose meter.I called patient 10/04/2017 to confirm she requesting the meter.Patient states she did not request the meter the company told her she qualified for it. Patient states she does not want the meter. I faxed a denied response on 10/04/2017 to [employer] . Provider is also aware

## 2017-10-30 LAB — BASIC METABOLIC PANEL WITH GFR
BUN / CREAT RATIO: 27 (calc) — AB (ref 6–22)
BUN: 47 mg/dL — ABNORMAL HIGH (ref 7–25)
CHLORIDE: 97 mmol/L — AB (ref 98–110)
CO2: 28 mmol/L (ref 20–32)
Calcium: 9.4 mg/dL (ref 8.6–10.4)
Creat: 1.75 mg/dL — ABNORMAL HIGH (ref 0.60–0.93)
GFR, EST AFRICAN AMERICAN: 32 mL/min/{1.73_m2} — AB (ref >=60)
GFR, Est Non African American: 28 mL/min/{1.73_m2} — ABNORMAL LOW (ref >=60)
Glucose, Bld: 254 mg/dL — ABNORMAL HIGH (ref 65–99)
POTASSIUM: 4.8 mmol/L (ref 3.5–5.3)
SODIUM: 137 mmol/L (ref 135–146)

## 2017-10-31 ENCOUNTER — Other Ambulatory Visit: Payer: Self-pay

## 2017-10-31 NOTE — Patient Outreach (Signed)
[facility]) Care Management  10/31/2017  SYRITA DOVEL 13-Feb-1940 [id number]   EMMI- General Discharge RED ON EMMI ALERT Day # 4 Date: 10/30/17 Red Alert Reason: Sad/hopeless/anxious/empty? yes  Outreach attempt: spoke with patient and addressed red alert.  Patient reports that it was a mistake.  Patient states that she has gone for follow up with her PCP and has another appointment.  Patient reports that she has all her medications and understands how to take them.  Discussed with patient [facility] services for further support and education.  Patient declines needs presently.     Plan: RN CM will close case.    Jone Baseman, RN, MSN [facility] Care Management Care Management Coordinator Direct Line [phone] Toll Free: [phone]  Fax: [phone]

## 2017-11-01 ENCOUNTER — Ambulatory Visit (INDEPENDENT_AMBULATORY_CARE_PROVIDER_SITE_OTHER): Payer: Medicare HMO | Admitting: Physician Assistant

## 2017-11-01 ENCOUNTER — Encounter: Payer: Self-pay | Admitting: Physician Assistant

## 2017-11-01 VITALS — BP 124/60 | HR 87 | Temp 98.4°F | Resp 14 | Ht 63.0 in | Wt 168.4 lb

## 2017-11-01 DIAGNOSIS — I5033 Acute on chronic diastolic (congestive) heart failure: Secondary | ICD-10-CM

## 2017-11-01 DIAGNOSIS — I5032 Chronic diastolic (congestive) heart failure: Secondary | ICD-10-CM

## 2017-11-01 DIAGNOSIS — J441 Chronic obstructive pulmonary disease with (acute) exacerbation: Secondary | ICD-10-CM | POA: Diagnosis not present

## 2017-11-01 NOTE — Progress Notes (Signed)
Patient ID: Laura Best MRN: [id number], DOB: May 30, 1940, 77 y.o. Date of Encounter: _0 @  Chief Complaint:  Chief Complaint  Patient presents with  . Follow-up    wheezing    HPI: 77 y.o. year old female  presents for follow up after recent hospitalization.  ------------------------------------------------------------------------------------------------------------------------------------------------------------------------------------------------------------------------------------------------ THE FOLLOWING IS COPIED [place]: Admit date: 10/21/2017 Discharge date: 10/24/2017  Time spent: 35 minutes  Recommendations for Outpatient Follow-up:  Repeat BMET to follow electrolytes and renal function. Reassess volume status and adjust diuretics regimen. Re-evaluate needs of oxygen supplementation and continue weaning off to Room air process as tolerated. Reassess BP and adjust antihypertensive regimen as needed.  Discharge Diagnoses:  Principal Problem:   Acute on chronic respiratory failure with hypoxia (HCC) Active Problems:   Essential hypertension   Type 2 diabetes with nephropathy (HCC)   COPD exacerbation (HCC)   CKD (chronic kidney disease) stage 3, GFR 30-59 ml/min (HCC)   GERD (gastroesophageal reflux disease)   Diastolic CHF, acute on chronic (HCC)   Diabetic peripheral neuropathy associated with type 2 diabetes mellitus (HCC)   Persistent atrial fibrillation   Acute respiratory failure with hypoxia ([place])   Uncontrolled type 2 diabetes mellitus with hyperglycemia (HCC)   Atrial fibrillation, chronic   Acute on chronic respiratory failure ([place])  History of present illness:  As per H&P written by Dr. Nehemiah Settle on 10/21/17 Laura Best a 77 y.o.femalewith a history of coronary artery disease, CKD stage III, COPD, hyperlipidemia, hypertension, diastolic heart failure with preserved LVEF,diabetes.Patient began having short of  breath last night that has been increasing since this morning. Also has increased cough with purulent sputum production. Also has increased peripheral edema over the past couple of days. Breathing worse with exertion and improved with rest. Also improved with nebulizer treatments.No fevers, chills, nausea, chest pain.She does have orthopnea, appears to be chronic.  Emergency Department Course: Chest x-ray shows vascular congestion no pulmonary edema. BNP 398. Patient received steroids, nebulizer treatments, Lasix, Augmentin. Patient vomited shortly after taking Augmentin  Hospital Course:  1-acute resp failure with hypoxia -in the setting of CHFa nd COPD exacerbation. -after improvement in COPD and completion of CHF treatment; patient was still requiring oxygen supplementation. -advised to follow low sodium diet and to take medications as prescribed  -outpatient follow up with PCP and cardiology service. -patient lasix dose adjusted and patient advised to follow low sodium diet. Patient will complete steroid tapering and continue inhaler/nebulizer treatment.  2-acute on chronic diastolic HF -echo reassuring -lasix dose adjusted -no further inpatient work up needed from cardiology stand point. -patient advised to check weight on daily basis and to follow low sodium diet.  3-COPD exacerbation -treated with IV steroids, Duoneb, pulmicort and oxygen supplementation. -breathing overall improved and capable of speaking in full sentences. Unfortunately patient was still requiring O2 supplementation at discharge.  4-CKD stage 3 -baseline Cr 1.5-1.8 -remains at baseline  -patient advised to keep herself well hydrated -recommending repeat BMET at follow up visit to assess renal function trend and electrolytes.  5-type 2 diabetes with nephropathy -CBG's higher than at baseline due to ongoing use of steroids. -advised to follow low carb diet -resume home hypoglycemic  regimen.  6-HTN -stable overall -resume home antihypertensive regimen.  7-HLD -continue statins  8-chronic atrial fibrillation -rate controlled -continue xarelto  9-gout -n acute flare ppreciated -continue Uloric for prevention.  ------------------------------------------------------------------------------------------------------------------------------------------------------------------------------------------------------------------------------------------------   ----------OV-----10/29/2017:  She does not have on any nasal cannula oxygen here in the office.  She reports that she has been using oxygen at home--- just did not bother to bring it out to the office.   States that she lives close by and knew she could be back at home on her oxygen soon.   Says that she has been using it at 2 L when at rest and puts it at 3 L when she is active.  She states that she needs me to send in nebulizer medicine to the pharmacy.   Says that "[facility] has brought her a nebulizer machine, but she has no medication to put in for nebulizer treatment." Says that she "has had no nebulized treatments since being at the hospital."  She has the prednisone bottle with her.  Notes that she is still on the prednisone taper.  I can hear a gurgly sound with her breathing even across the room.   I asked her how she is feeling in comparison to being at the hospital.   Says that she "is a little bit better--- little bit at the time"    -------------------------------A/P AT OV 10/29/2017:------------------------------------------ . Hospital discharge follow-up Given 1 DuoNeb nebulized treatment here in the office. At this time, I am sending the prescriptions for the nebulizer medications to the local pharmacy -- [facility].  They are to pick up the prescriptions today and start using these nebulized treatments as directed. If her breathing worsens, she needs to follow-up with [place] or the ED  immediately. Otherwise I am going to have her return here in 2 days for repeat visit to re-examine her and recheck her lungs.  Concerned that once she is off of prednisone may decompensate. Also check bmet to f/u labs.  I did recheck her oxygen saturation level myself and it is reading 95% on room air. - BASIC METABOLIC PANEL WITH GFR - ipratropium-albuterol (DUONEB) 0.5-2.5 (3) MG/3ML nebulizer solution 3 mL - ipratropium-albuterol (DUONEB) 0.5-2.5 (3) MG/3ML SOLN; Take 3 mLs by nebulization every 6 (six) hours as needed.  Dispense: 360 mL; Refill: 5 - budesonide (PULMICORT) 0.5 MG/2ML nebulizer solution; Take 2 mLs (0.5 mg total) by nebulization 2 (two) times daily.  Dispense: 120 mL; Refill: 11     11/01/2017: Today she reports that she did get the nebulizer medication and has been using the nebulizer as directed. Reports that she has one remaining on the prednisone.  Then is completed with the prednisone taper. She states that each day she feels a little better-----" a little better each day, honey" States that she was also supposed to let me know last visit that she did get her flu vaccine while she was at the hospital.   Past Medical History:  Diagnosis Date  . Arthritis    "hands, feet" (02/17/2016)  . CAD (coronary artery disease)    a. 05/2016: cath showing nonobstructive CAD with 50% prox-Cx stenosis  . CKD (chronic kidney disease) stage 3, GFR 30-59 ml/min (HCC) 12/25/2012  . COPD (chronic obstructive pulmonary disease) ([place])   . Diastolic heart failure ([place])   . Hyperlipidemia   . Hypertension   . Osteoporosis   . Pneumonia    "several times" (02/17/2016)  . Small bowel obstruction ([place]) 02/17/2016  . Tricuspid regurgitation    Echo 05/02/06-Nml LV. Mild MR. Mod TR  . Type II diabetes mellitus ([place])   . Vitamin D deficiency      Home Meds: Outpatient Medications Prior to Visit  Medication Sig Dispense Refill  . acetaminophen (TYLENOL) 500 MG tablet Take 500 mg by  mouth every 6 (six) hours as needed for pain.    Marland Kitchen amLODipine (NORVASC) 10 MG tablet Take 0.5 tablets (5 mg total) by mouth daily.    . BD PEN NEEDLE MICRO U/F 32G X 6 MM MISC USE AS DIRECTED THREE TIMES DAILY 300 each 2  . Blood Glucose Monitoring Suppl (ACCU-CHEK GUIDE) w/Device KIT USE AS DIRECTED 1 kit 0  . Blood Glucose Monitoring Suppl KIT Use to check blood sugar  Three times daily before meals and at bedtime.ICD10: E11.8.Please dispense based on insurance preference 1 each 0  . budesonide (PULMICORT) 0.5 MG/2ML nebulizer solution Take 2 mLs (0.5 mg total) by nebulization 2 (two) times daily. 120 mL 11  . calcitRIOL (ROCALTROL) 0.25 MCG capsule Take 1 capsule (0.25 mcg total) by mouth every Monday, Wednesday, and Friday. 90 capsule 1  . Cholecalciferol (VITAMIN D) 2000 UNITS CAPS Take 2,000 Units by mouth daily.     . cloNIDine (CATAPRES) 0.2 MG tablet Take 1 tablet (0.2 mg total) by mouth 2 (two) times daily.    Marland Kitchen docusate sodium (COLACE) 100 MG capsule Take 1 capsule (100 mg total) by mouth 2 (two) times daily. 60 capsule 1  . febuxostat (ULORIC) 40 MG tablet Take 1 tablet (40 mg total) by mouth daily. 90 tablet 0  . furosemide (LASIX) 40 MG tablet Take 60 mg daily    . glucose blood test strip Use as directed. Check blood sugar 3 times daily before meals and at bedtime. 100 each 12  . insulin aspart (NOVOLOG FLEXPEN) 100 UNIT/ML FlexPen INJECT 7 UNITS SUBCUTANEOUSLY WITH LUNCH AND SUPPER. INJECT 15 MINUTES PRIOR TO MEAL OR IMMEDIATELY AFTER MEAL. (Patient taking differently: Inject 7 Units into the skin 2 (two) times daily. INJECT 7 UNITS SUBCUTANEOUSLY WITH LUNCH AND SUPPER. INJECT 15 MINUTES PRIOR TO MEAL OR IMMEDIATELY AFTER MEAL.) 15 pen 11  . Insulin Detemir (LEVEMIR FLEXTOUCH) 100 UNIT/ML Pen INJECT 22 UNITS SUBCUTANEOUSLY ONCE DAILY AT  10  PM (Patient taking differently: Inject 22 Units into the skin at bedtime. SUBCUTANEOUSLY ONCE DAILY AT  10  PM) 15 pen 2  . ipratropium-albuterol  (DUONEB) 0.5-2.5 (3) MG/3ML SOLN Take 3 mLs by nebulization every 6 (six) hours as needed. 360 mL 5  . Lancets (ACCU-CHEK SOFT TOUCH) lancets Use to check blood sugar ICD10:e11.8 Dispense based on insurance preference 100 each 12  . Lancets (ONETOUCH ULTRASOFT) lancets Use as instructed 100 each 12  . omeprazole (PRILOSEC) 20 MG capsule Take 1 capsule (20 mg total) by mouth daily. 90 capsule 3  . polyethylene glycol (MIRALAX) packet Take 17 g by mouth daily. Hold for diarrhea. 28 each 1  . predniSONE (DELTASONE) 20 MG tablet Take 3 tablets by mouth daily X2 days; then 2 tablets by mouth daily X2 days; take 1 tablet by mouth daily X3 days; then 1/2 tablet by mouth daily X3 days and then stop prednisone. 16 tablet 0  . psyllium (HYDROCIL/METAMUCIL) 95 % PACK Take 1 packet by mouth 3 (three) times daily. 56 each 0  . RELION SHORT PEN NEEDLES 31G X 8 MM MISC USE ONE PEN NEEDLE ONCE DAILY AS DIRECTED 50 each 11  . Rivaroxaban (XARELTO) 15 MG TABS tablet Take 1 tablet (15 mg total) by mouth daily. (Patient taking differently: Take 15 mg by mouth every morning. ) 30 tablet 6  . simvastatin (ZOCOR) 20 MG tablet TAKE ONE TABLET (20MG) BY MOUTH ONCE DAILY AT BEDTIME (Patient taking differently: Take 20 mg by mouth at bedtime. )  90 tablet 3  . carvedilol (COREG) 12.5 MG tablet Take 1 tablet (12.5 mg total) by mouth 2 (two) times daily. 180 tablet 3   No facility-administered medications prior to visit.     Allergies:  Allergies  Allergen Reactions  . Ace Inhibitors Other (See Comments)    Hyperkalemia  . Actos [Pioglitazone] Swelling  . Aspirin Other (See Comments)    G.I. Upset. In high doses  . Metformin And Related Diarrhea  . Eliquis [Apixaban] Rash and Other (See Comments)    Patient states that this med was d/ced by prescriber due to interference with allergies and states that she also had a breakout    Social History   Socioeconomic History  . Marital status: Widowed    Spouse name: Not  on file  . Number of children: Not on file  . Years of education: Not on file  . Highest education level: Not on file  Occupational History  . Occupation: retired  Scientific laboratory technician  . Financial resource strain: Not on file  . Food insecurity:    Worry: Not on file    Inability: Not on file  . Transportation needs:    Medical: Not on file    Non-medical: Not on file  Tobacco Use  . Smoking status: Former Smoker    Packs/day: 0.50    Years: 50.00    Pack years: 25.00    Types: Cigarettes    Last attempt to quit: 01/19/2009    Years since quitting: 8.7  . Smokeless tobacco: Never Used  Substance and Sexual Activity  . Alcohol use: No    Alcohol/week: 0.0 standard drinks  . Drug use: No  . Sexual activity: Never  Lifestyle  . Physical activity:    Days per week: Not on file    Minutes per session: Not on file  . Stress: Not on file  Relationships  . Social connections:    Talks on phone: Not on file    Gets together: Not on file    Attends religious service: Not on file    Active member of club or organization: Not on file    Attends meetings of clubs or organizations: Not on file    Relationship status: Not on file  . Intimate partner violence:    Fear of current or ex partner: Not on file    Emotionally abused: Not on file    Physically abused: Not on file    Forced sexual activity: Not on file  Other Topics Concern  . Not on file  Social History Narrative   Entered 10/2013:   She has never driven.   She lives with her son.    Family History  Problem Relation Age of Onset  . Cancer Father   . Diabetes Father   . Cancer Brother        Brain  . Cancer Sister        abdominal fat     Review of Systems:  See HPI for pertinent ROS. All other ROS negative.    Physical Exam: Blood pressure 124/60, pulse 87, temperature 98.4 F (36.9 C), temperature source Oral, resp. rate 14, height _0  (1.6 m), weight 76.4 kg, SpO2 95 %., Body mass index is 29.83  kg/m. General: WNWD WF. Appears in no acute distress. Neck: Supple. No thyromegaly. No lymphadenopathy. Lungs: Clear bilaterally to auscultation without wheezes, rales, or rhonchi. Breathing is unlabored.  Last visit I could hear gurgly sound even just sitting in the  exam room with her.  Today I am not hearing any type of sounds like that. She is having no dyspnea when talking to me.  No increased work of breathing.  She is not wearing any oxygen today and oxygen sat is 95% on room air. Lungs are clear throughout bilaterally except just very faint crackles at the right base.  Otherwise is clear with good breath sounds. Heart: Irregular Musculoskeletal:  Strength and tone normal for age. Extremities/Skin: Warm and dry. 1+ Pitting pedal edema on right. Trace pedal LE edema on left. Neuro: Alert and oriented X 3. Moves all extremities spontaneously. Gait is normal. CNII-XII grossly in tact. Psych:  Responds to questions appropriately with a normal affect.      ASSESSMENT AND PLAN:  76 y.o. year old female with    1. Chronic diastolic congestive heart failure ([place]) I obtained bmet 10/29/17.  At that lab BUN was elevated at 47 and creatinine was 1.75.  Potassium was good at 4.8. I am concerned of dehydration with elevated BUN.  However I did not decrease her Lasix at that time that I got that lab results because she was still having edema and lungs were not clear. Today her lungs are much clearer and her edema is improved compared to visit 10/29/2017. Will have her come in for another visit on Monday.   Will reassess her physical exam and reassess her clinically at that time and will recheck bmet at that time and then will decrease diuretic dose and adjust diuretic dose accordingly. For now I am going to continue all meds the same.  Will continue her DuoNeb and her Lasix as well as other meds. She is scheduling follow-up visit with me Monday.  2. Diastolic CHF, acute on chronic (HCC)  3.  COPD exacerbation [facility])     Signed, [address] [place], [place], [facility] 11/01/2017 2:43 PM

## 2017-11-05 ENCOUNTER — Ambulatory Visit: Payer: Medicare HMO | Admitting: Physician Assistant

## 2017-11-06 ENCOUNTER — Other Ambulatory Visit: Payer: Self-pay | Admitting: Physician Assistant

## 2017-11-07 ENCOUNTER — Other Ambulatory Visit: Payer: Self-pay

## 2017-11-07 ENCOUNTER — Encounter: Payer: Self-pay | Admitting: Physician Assistant

## 2017-11-07 ENCOUNTER — Ambulatory Visit (INDEPENDENT_AMBULATORY_CARE_PROVIDER_SITE_OTHER): Payer: Medicare HMO | Admitting: Physician Assistant

## 2017-11-07 VITALS — BP 130/64 | HR 66 | Temp 97.6°F | Resp 14 | Ht 63.0 in | Wt 166.4 lb

## 2017-11-07 DIAGNOSIS — I5032 Chronic diastolic (congestive) heart failure: Secondary | ICD-10-CM | POA: Diagnosis not present

## 2017-11-07 DIAGNOSIS — N183 Chronic kidney disease, stage 3 unspecified: Secondary | ICD-10-CM

## 2017-11-07 DIAGNOSIS — J441 Chronic obstructive pulmonary disease with (acute) exacerbation: Secondary | ICD-10-CM

## 2017-11-07 DIAGNOSIS — E118 Type 2 diabetes mellitus with unspecified complications: Secondary | ICD-10-CM

## 2017-11-07 DIAGNOSIS — E1142 Type 2 diabetes mellitus with diabetic polyneuropathy: Secondary | ICD-10-CM

## 2017-11-07 MED ORDER — OMEPRAZOLE 20 MG PO CPDR: 20.0000 mg | DELAYED_RELEASE_CAPSULE | Freq: Every day | ORAL | 3 refills | Status: DC

## 2017-11-07 MED ORDER — GABAPENTIN 100 MG PO CAPS: ORAL_CAPSULE | ORAL | 0 refills | Status: DC

## 2017-11-08 LAB — BASIC METABOLIC PANEL WITH GFR
BUN/Creatinine Ratio: 16 (calc) (ref 6–22)
BUN: 32 mg/dL — ABNORMAL HIGH (ref 7–25)
CALCIUM: 8.5 mg/dL — AB (ref 8.6–10.4)
CHLORIDE: 99 mmol/L (ref 98–110)
CO2: 24 mmol/L (ref 20–32)
Creat: 1.96 mg/dL — ABNORMAL HIGH (ref 0.60–0.93)
GFR, EST NON AFRICAN AMERICAN: 24 mL/min/{1.73_m2} — AB (ref >=60)
GFR, Est African American: 28 mL/min/{1.73_m2} — ABNORMAL LOW (ref >=60)
GLUCOSE: 399 mg/dL — AB (ref 65–99)
POTASSIUM: 4.2 mmol/L (ref 3.5–5.3)
SODIUM: 136 mmol/L (ref 135–146)

## 2017-11-14 ENCOUNTER — Telehealth: Payer: Self-pay

## 2017-11-14 NOTE — Telephone Encounter (Signed)
Call was placed to patient to inform her we needed to change her appointment to see Dr Dennard Schaumann for her follow up visit

## 2017-11-21 ENCOUNTER — Other Ambulatory Visit: Payer: Self-pay | Admitting: Physician Assistant

## 2017-11-21 ENCOUNTER — Ambulatory Visit: Payer: Medicare HMO | Admitting: Physician Assistant

## 2017-11-21 DIAGNOSIS — N183 Chronic kidney disease, stage 3 unspecified: Secondary | ICD-10-CM

## 2017-11-21 DIAGNOSIS — M1000 Idiopathic gout, unspecified site: Secondary | ICD-10-CM

## 2017-11-22 ENCOUNTER — Ambulatory Visit (INDEPENDENT_AMBULATORY_CARE_PROVIDER_SITE_OTHER): Payer: Medicare HMO | Admitting: Family Medicine

## 2017-11-22 ENCOUNTER — Ambulatory Visit: Payer: Self-pay | Admitting: Physician Assistant

## 2017-11-22 ENCOUNTER — Other Ambulatory Visit: Payer: Self-pay

## 2017-11-22 ENCOUNTER — Encounter: Payer: Self-pay | Admitting: Family Medicine

## 2017-11-22 VITALS — BP 112/60 | HR 72 | Wt 167.0 lb

## 2017-11-22 DIAGNOSIS — I482 Chronic atrial fibrillation, unspecified: Secondary | ICD-10-CM

## 2017-11-22 DIAGNOSIS — J449 Chronic obstructive pulmonary disease, unspecified: Secondary | ICD-10-CM

## 2017-11-22 DIAGNOSIS — E1165 Type 2 diabetes mellitus with hyperglycemia: Secondary | ICD-10-CM | POA: Diagnosis not present

## 2017-11-22 DIAGNOSIS — I5032 Chronic diastolic (congestive) heart failure: Secondary | ICD-10-CM | POA: Diagnosis not present

## 2017-11-22 NOTE — Progress Notes (Signed)
Subjective:    Patient ID: Laura Best, female    DOB: 1940-05-02, 77 y.o.   MRN: [id number]  HPI Patient is here today to establish care with me.  Her previous primary care provider has left the practice.  Given her complicated medical history, I wanted to see the patient to determine her overall medical management.  First, the patient has insulin-dependent diabetes mellitus.  She is currently using Levemir 14 units a day.  She takes 7 units of NovoLog with lunch and supper.  Fasting blood sugars range between 89 and 180 with the majority being between 100-140.  Lunchtime sugars are typically 1 80-2 80 with the majority around 230.  Suppertime sugars are between 206 and 255.  Bedtime sugars are between 140 and 200.  Second, the patient has chronic diastolic heart failure.  She is frequently admitted to the hospital due to pulmonary edema.  Third she has COPD.  She has been treated several times in the past for COPD exacerbations according to her records.  On exam today, she has diminished breath sounds bilaterally with expiratory wheezes and rhonchorous breath sounds in both bases.  She is not on any maintenance therapy.  She reports dyspnea on exertion.  She also reports dyspnea with minimal activity. Past Medical History:  Diagnosis Date  . Arthritis    "hands, feet" (02/17/2016)  . CAD (coronary artery disease)    a. 05/2016: cath showing nonobstructive CAD with 50% prox-Cx stenosis  . CKD (chronic kidney disease) stage 3, GFR 30-59 ml/min (HCC) 12/25/2012  . COPD (chronic obstructive pulmonary disease) ([place])   . Diastolic heart failure ([place])   . Hyperlipidemia   . Hypertension   . Osteoporosis   . Pneumonia    "several times" (02/17/2016)  . Small bowel obstruction ([place]) 02/17/2016  . Tricuspid regurgitation    Echo 05/02/06-Nml LV. Mild MR. Mod TR  . Type II diabetes mellitus ([place])   . Vitamin D deficiency    Past Surgical History:  Procedure Laterality Date  . APPENDECTOMY    .  HEART CHAMBER REVISION     patched hole --[place]  . LAPAROSCOPIC APPENDECTOMY N/A 09/27/2013   Procedure: APPENDECTOMY - CONVERTED FROM LAPAROSCOPIC;  Surgeon: Donnie Mesa, MD;  Location: [facility];  Service: General;  Laterality: N/A;  . RIGHT/LEFT HEART CATH AND CORONARY ANGIOGRAPHY N/A 05/24/2016   Procedure: Right/Left Heart Cath and Coronary Angiography;  Surgeon: Larey Dresser, MD;  Location: [facility];  Service: Cardiovascular;  Laterality: N/A;  . TUBAL LIGATION     Current Outpatient Medications on File Prior to Visit  Medication Sig Dispense Refill  . acetaminophen (TYLENOL) 500 MG tablet Take 500 mg by mouth every 6 (six) hours as needed for pain.    Marland Kitchen amLODipine (NORVASC) 10 MG tablet Take 0.5 tablets (5 mg total) by mouth daily.    . BD PEN NEEDLE MICRO U/F 32G X 6 MM MISC USE AS DIRECTED THREE TIMES DAILY 300 each 2  . Blood Glucose Monitoring Suppl (ACCU-CHEK GUIDE) w/Device KIT USE AS DIRECTED 1 kit 0  . Blood Glucose Monitoring Suppl KIT Use to check blood sugar  Three times daily before meals and at bedtime.ICD10: E11.8.Please dispense based on insurance preference 1 each 0  . budesonide (PULMICORT) 0.5 MG/2ML nebulizer solution Take 2 mLs (0.5 mg total) by nebulization 2 (two) times daily. 120 mL 11  . calcitRIOL (ROCALTROL) 0.25 MCG capsule Take 1 capsule (0.25 mcg total) by mouth every  Monday, Wednesday, and Friday. 90 capsule 1  . Cholecalciferol (VITAMIN D) 2000 UNITS CAPS Take 2,000 Units by mouth daily.     . cloNIDine (CATAPRES) 0.3 MG tablet Take 0.3 mg by mouth 2 (two) times daily.    Marland Kitchen docusate sodium (COLACE) 100 MG capsule Take 1 capsule (100 mg total) by mouth 2 (two) times daily. 60 capsule 1  . furosemide (LASIX) 40 MG tablet Take 60 mg daily    . gabapentin (NEURONTIN) 100 MG capsule TAKE 1 TO 2 CAPSULES BY MOUTH AT BEDTIME FOR PERIPHERAL NEUROPATHY. 120 capsule 0  . glucose blood test strip Use as directed. Check blood sugar 3 times daily before  meals and at bedtime. 100 each 12  . insulin aspart (NOVOLOG FLEXPEN) 100 UNIT/ML FlexPen INJECT 7 UNITS SUBCUTANEOUSLY WITH LUNCH AND SUPPER. INJECT 15 MINUTES PRIOR TO MEAL OR IMMEDIATELY AFTER MEAL. (Patient taking differently: Inject 7 Units into the skin 2 (two) times daily. INJECT 7 UNITS SUBCUTANEOUSLY WITH LUNCH AND SUPPER. INJECT 15 MINUTES PRIOR TO MEAL OR IMMEDIATELY AFTER MEAL.) 15 pen 11  . Insulin Detemir (LEVEMIR FLEXTOUCH) 100 UNIT/ML Pen INJECT 22 UNITS SUBCUTANEOUSLY ONCE DAILY AT  10  PM (Patient taking differently: Inject 22 Units into the skin at bedtime. SUBCUTANEOUSLY ONCE DAILY AT  10  PM) 15 pen 2  . ipratropium-albuterol (DUONEB) 0.5-2.5 (3) MG/3ML SOLN Take 3 mLs by nebulization every 6 (six) hours as needed. 360 mL 5  . Lancets (ACCU-CHEK SOFT TOUCH) lancets Use to check blood sugar ICD10:e11.8 Dispense based on insurance preference 100 each 12  . Lancets (ONETOUCH ULTRASOFT) lancets Use as instructed 100 each 12  . omeprazole (PRILOSEC) 20 MG capsule Take 1 capsule (20 mg total) by mouth daily. 90 capsule 3  . polyethylene glycol (MIRALAX) packet Take 17 g by mouth daily. Hold for diarrhea. 28 each 1  . PREDNISONE PO Take 40 mg by mouth as needed.    . psyllium (HYDROCIL/METAMUCIL) 95 % PACK Take 1 packet by mouth 3 (three) times daily. 56 each 0  . RELION SHORT PEN NEEDLES 31G X 8 MM MISC USE ONE PEN NEEDLE ONCE DAILY AS DIRECTED 50 each 11  . Rivaroxaban (XARELTO) 15 MG TABS tablet Take 1 tablet (15 mg total) by mouth daily. (Patient taking differently: Take 15 mg by mouth every morning. ) 30 tablet 6  . simvastatin (ZOCOR) 20 MG tablet TAKE ONE TABLET ('20MG'$ ) BY MOUTH ONCE DAILY AT BEDTIME (Patient taking differently: Take 20 mg by mouth at bedtime. ) 90 tablet 3  . carvedilol (COREG) 12.5 MG tablet Take 1 tablet (12.5 mg total) by mouth 2 (two) times daily. 180 tablet 3   No current facility-administered medications on file prior to visit.    Allergies  Allergen  Reactions  . Ace Inhibitors Other (See Comments)    Hyperkalemia  . Actos [Pioglitazone] Swelling  . Aspirin Other (See Comments)    G.I. Upset. In high doses  . Metformin And Related Diarrhea  . Eliquis [Apixaban] Rash and Other (See Comments)    Patient states that this med was d/ced by prescriber due to interference with allergies and states that she also had a breakout   Social History   Socioeconomic History  . Marital status: Widowed    Spouse name: Not on file  . Number of children: Not on file  . Years of education: Not on file  . Highest education level: Not on file  Occupational History  . Occupation: retired  Science writer  Needs  . Financial resource strain: Not on file  . Food insecurity:    Worry: Not on file    Inability: Not on file  . Transportation needs:    Medical: Not on file    Non-medical: Not on file  Tobacco Use  . Smoking status: Former Smoker    Packs/day: 0.50    Years: 50.00    Pack years: 25.00    Types: Cigarettes    Last attempt to quit: 01/19/2009    Years since quitting: 8.8  . Smokeless tobacco: Never Used  Substance and Sexual Activity  . Alcohol use: No    Alcohol/week: 0.0 standard drinks  . Drug use: No  . Sexual activity: Never  Lifestyle  . Physical activity:    Days per week: Not on file    Minutes per session: Not on file  . Stress: Not on file  Relationships  . Social connections:    Talks on phone: Not on file    Gets together: Not on file    Attends religious service: Not on file    Active member of club or organization: Not on file    Attends meetings of clubs or organizations: Not on file    Relationship status: Not on file  . Intimate partner violence:    Fear of current or ex partner: Not on file    Emotionally abused: Not on file    Physically abused: Not on file    Forced sexual activity: Not on file  Other Topics Concern  . Not on file  Social History Narrative   Entered 10/2013:   She has never driven.    She lives with her son.      Review of Systems  All other systems reviewed and are negative.      Objective:   Physical Exam  Constitutional: She appears well-developed and well-nourished. No distress.  HENT:  Head: Normocephalic and atraumatic.  Neck: Neck supple. No JVD present.  Cardiovascular: Normal rate. An irregularly irregular rhythm present.  Pulmonary/Chest: Effort normal. She has wheezes. She has rhonchi.  Abdominal: Soft. Bowel sounds are normal. She exhibits no distension and no mass. There is no tenderness. There is no rebound and no guarding. No hernia.  Musculoskeletal: She exhibits edema.  Lymphadenopathy:    She has no cervical adenopathy.  Skin: She is not diaphoretic.  Vitals reviewed.         Assessment & Plan:  Uncontrolled type 2 diabetes mellitus with hyperglycemia (HCC)  Atrial fibrillation, chronic  Chronic diastolic congestive heart failure (HCC)  Chronic obstructive pulmonary disease, unspecified COPD type ([place])  First going to work on her blood sugars.  I want to increase her Levemir from 14 units a day to 20 units a day.  Continue the 7 units of NovoLog with lunch and supper.  Recheck blood sugars in 2 weeks.  Second I want to work on her breathing.  She has diminished breath sounds bilaterally with expiratory wheezing and rhonchorous breath sounds.  I believe she would benefit from a daily maintenance therapy.  Therefore I will start the patient on Trelegy 1 inhalation a day.  I have given her samples.  I would like to see her back in 2 weeks to see if her breathing has improved on the maintenance therapy.  I hope that this also will help control or prevent future exacerbations and reduce her chance of hospitalization.

## 2017-11-24 DIAGNOSIS — E1165 Type 2 diabetes mellitus with hyperglycemia: Secondary | ICD-10-CM | POA: Diagnosis not present

## 2017-11-24 DIAGNOSIS — I5033 Acute on chronic diastolic (congestive) heart failure: Secondary | ICD-10-CM | POA: Diagnosis not present

## 2017-11-24 DIAGNOSIS — I509 Heart failure, unspecified: Secondary | ICD-10-CM | POA: Diagnosis not present

## 2017-11-24 DIAGNOSIS — I5031 Acute diastolic (congestive) heart failure: Secondary | ICD-10-CM | POA: Diagnosis not present

## 2017-11-24 DIAGNOSIS — J209 Acute bronchitis, unspecified: Secondary | ICD-10-CM | POA: Diagnosis not present

## 2017-11-24 DIAGNOSIS — A4189 Other specified sepsis: Secondary | ICD-10-CM | POA: Diagnosis not present

## 2017-11-24 DIAGNOSIS — J449 Chronic obstructive pulmonary disease, unspecified: Secondary | ICD-10-CM | POA: Diagnosis not present

## 2017-11-24 DIAGNOSIS — I2783 Eisenmenger's syndrome: Secondary | ICD-10-CM | POA: Diagnosis not present

## 2017-11-24 DIAGNOSIS — J441 Chronic obstructive pulmonary disease with (acute) exacerbation: Secondary | ICD-10-CM | POA: Diagnosis not present

## 2017-11-25 DIAGNOSIS — I2783 Eisenmenger's syndrome: Secondary | ICD-10-CM | POA: Diagnosis not present

## 2017-11-25 DIAGNOSIS — A4189 Other specified sepsis: Secondary | ICD-10-CM | POA: Diagnosis not present

## 2017-11-25 DIAGNOSIS — J449 Chronic obstructive pulmonary disease, unspecified: Secondary | ICD-10-CM | POA: Diagnosis not present

## 2017-11-25 DIAGNOSIS — I5033 Acute on chronic diastolic (congestive) heart failure: Secondary | ICD-10-CM | POA: Diagnosis not present

## 2017-11-25 DIAGNOSIS — I5031 Acute diastolic (congestive) heart failure: Secondary | ICD-10-CM | POA: Diagnosis not present

## 2017-11-25 DIAGNOSIS — E1165 Type 2 diabetes mellitus with hyperglycemia: Secondary | ICD-10-CM | POA: Diagnosis not present

## 2017-11-25 DIAGNOSIS — I509 Heart failure, unspecified: Secondary | ICD-10-CM | POA: Diagnosis not present

## 2017-11-25 DIAGNOSIS — J209 Acute bronchitis, unspecified: Secondary | ICD-10-CM | POA: Diagnosis not present

## 2017-11-25 DIAGNOSIS — J441 Chronic obstructive pulmonary disease with (acute) exacerbation: Secondary | ICD-10-CM | POA: Diagnosis not present

## 2017-12-06 ENCOUNTER — Ambulatory Visit: Payer: Medicare HMO | Admitting: Family Medicine

## 2017-12-11 ENCOUNTER — Ambulatory Visit (INDEPENDENT_AMBULATORY_CARE_PROVIDER_SITE_OTHER): Payer: Medicare HMO | Admitting: Family Medicine

## 2017-12-11 ENCOUNTER — Encounter: Payer: Self-pay | Admitting: Family Medicine

## 2017-12-11 VITALS — BP 120/60 | HR 78 | Temp 98.1°F | Resp 12 | Ht 63.0 in | Wt 170.0 lb

## 2017-12-11 DIAGNOSIS — J449 Chronic obstructive pulmonary disease, unspecified: Secondary | ICD-10-CM | POA: Diagnosis not present

## 2017-12-11 DIAGNOSIS — E1165 Type 2 diabetes mellitus with hyperglycemia: Secondary | ICD-10-CM

## 2017-12-11 MED ORDER — FLUTICASONE-UMECLIDIN-VILANT 100-62.5-25 MCG/INH IN AEPB: 1.0000 | INHALATION_SPRAY | Freq: Every day | RESPIRATORY_TRACT | 11 refills | Status: DC

## 2017-12-11 NOTE — Progress Notes (Signed)
Subjective:    Patient ID: Laura Best, female    DOB: 1940-07-22, 77 y.o.   MRN: [id number]  HPI  11/22/17 Patient is here today to establish care with me.  Her previous primary care provider has left the practice.  Given her complicated medical history, I wanted to see the patient to determine her overall medical management.  First, the patient has insulin-dependent diabetes mellitus.  She is currently using Levemir 14 units a day.  She takes 7 units of NovoLog with lunch and supper.  Fasting blood sugars range between 89 and 180 with the majority being between 100-140.  Lunchtime sugars are typically 1 80-280 with the majority around 230.  Suppertime sugars are between 206 and 255.  Bedtime sugars are between 140 and 200.  Second, the patient has chronic diastolic heart failure.  She is frequently admitted to the hospital due to pulmonary edema.  Third she has COPD.  She has been treated several times in the past for COPD exacerbations according to her records.  On exam today, she has diminished breath sounds bilaterally with expiratory wheezes and rhonchorous breath sounds in both bases.  She is not on any maintenance therapy.  She reports dyspnea on exertion.  She also reports dyspnea with minimal activity.  At that time, my plan was: First going to work on her blood sugars.  I want to increase her Levemir from 14 units a day to 20 units a day.  Continue the 7 units of NovoLog with lunch and supper.  Recheck blood sugars in 2 weeks.  Second I want to work on her breathing.  She has diminished breath sounds bilaterally with expiratory wheezing and rhonchorous breath sounds.  I believe she would benefit from a daily maintenance therapy.  Therefore I will start the patient on Trelegy 1 inhalation a day.  I have given her samples.  I would like to see her back in 2 weeks to see if her breathing has improved on the maintenance therapy.  I hope that this also will help control or prevent future  exacerbations and reduce her chance of hospitalization.  12/11/17 I am ecstatic!.  Patient's fasting blood sugars are ranging between 67 and 137 however the vast majority are between 100-130.  Her prelunch sugars are between 150 and 180.  Her predinner sugars are between 111 and 193.  Her bedtime sugars are between 130 and 183.  She only has 2 values greater than 200.  This is much better than last time.  She also states that her breathing has improved dramatically since starting Trelegy.  She would like to continue this medication.  On examination today she does have faint bibasilar crackles however her expiratory wheezing has improved dramatically and her breath sounds have improved with improved aeration and more air movement. Past Medical History:  Diagnosis Date  . Arthritis    "hands, feet" (02/17/2016)  . CAD (coronary artery disease)    a. 05/2016: cath showing nonobstructive CAD with 50% prox-Cx stenosis  . CKD (chronic kidney disease) stage 3, GFR 30-59 ml/min (HCC) 12/25/2012  . COPD (chronic obstructive pulmonary disease) ([place])   . Diastolic heart failure ([place])   . Hyperlipidemia   . Hypertension   . Osteoporosis   . Pneumonia    "several times" (02/17/2016)  . Small bowel obstruction ([place]) 02/17/2016  . Tricuspid regurgitation    Echo 05/02/06-Nml LV. Mild MR. Mod TR  . Type II diabetes mellitus ([place])   . Vitamin D  deficiency    Past Surgical History:  Procedure Laterality Date  . APPENDECTOMY    . HEART CHAMBER REVISION     patched hole --[place]  . LAPAROSCOPIC APPENDECTOMY N/A 09/27/2013   Procedure: APPENDECTOMY - CONVERTED FROM LAPAROSCOPIC;  Surgeon: Donnie Mesa, MD;  Location: [facility];  Service: General;  Laterality: N/A;  . RIGHT/LEFT HEART CATH AND CORONARY ANGIOGRAPHY N/A 05/24/2016   Procedure: Right/Left Heart Cath and Coronary Angiography;  Surgeon: Larey Dresser, MD;  Location: [facility];  Service: Cardiovascular;  Laterality: N/A;  . TUBAL LIGATION      Current Outpatient Medications on File Prior to Visit  Medication Sig Dispense Refill  . acetaminophen (TYLENOL) 500 MG tablet Take 500 mg by mouth every 6 (six) hours as needed for pain.    Marland Kitchen amLODipine (NORVASC) 10 MG tablet Take 0.5 tablets (5 mg total) by mouth daily.    . BD PEN NEEDLE MICRO U/F 32G X 6 MM MISC USE AS DIRECTED THREE TIMES DAILY 300 each 2  . Blood Glucose Monitoring Suppl (ACCU-CHEK GUIDE) w/Device KIT USE AS DIRECTED 1 kit 0  . Blood Glucose Monitoring Suppl KIT Use to check blood sugar  Three times daily before meals and at bedtime.ICD10: E11.8.Please dispense based on insurance preference 1 each 0  . budesonide (PULMICORT) 0.5 MG/2ML nebulizer solution Take 2 mLs (0.5 mg total) by nebulization 2 (two) times daily. 120 mL 11  . calcitRIOL (ROCALTROL) 0.25 MCG capsule Take 1 capsule (0.25 mcg total) by mouth every Monday, Wednesday, and Friday. 90 capsule 1  . carvedilol (COREG) 12.5 MG tablet Take 1 tablet (12.5 mg total) by mouth 2 (two) times daily. 180 tablet 3  . Cholecalciferol (VITAMIN D) 2000 UNITS CAPS Take 2,000 Units by mouth daily.     . cloNIDine (CATAPRES) 0.3 MG tablet Take 0.3 mg by mouth 2 (two) times daily.    Marland Kitchen docusate sodium (COLACE) 100 MG capsule Take 1 capsule (100 mg total) by mouth 2 (two) times daily. 60 capsule 1  . furosemide (LASIX) 40 MG tablet Take 60 mg daily    . gabapentin (NEURONTIN) 100 MG capsule TAKE 1 TO 2 CAPSULES BY MOUTH AT BEDTIME FOR PERIPHERAL NEUROPATHY. 120 capsule 0  . glucose blood test strip Use as directed. Check blood sugar 3 times daily before meals and at bedtime. 100 each 12  . insulin aspart (NOVOLOG FLEXPEN) 100 UNIT/ML FlexPen INJECT 7 UNITS SUBCUTANEOUSLY WITH LUNCH AND SUPPER. INJECT 15 MINUTES PRIOR TO MEAL OR IMMEDIATELY AFTER MEAL. (Patient taking differently: Inject 7 Units into the skin 2 (two) times daily. INJECT 7 UNITS SUBCUTANEOUSLY WITH LUNCH AND SUPPER. INJECT 15 MINUTES PRIOR TO MEAL OR IMMEDIATELY  AFTER MEAL.) 15 pen 11  . Insulin Detemir (LEVEMIR FLEXTOUCH) 100 UNIT/ML Pen INJECT 22 UNITS SUBCUTANEOUSLY ONCE DAILY AT  10  PM (Patient taking differently: Inject 22 Units into the skin at bedtime. SUBCUTANEOUSLY ONCE DAILY AT  10  PM) 15 pen 2  . ipratropium-albuterol (DUONEB) 0.5-2.5 (3) MG/3ML SOLN Take 3 mLs by nebulization every 6 (six) hours as needed. 360 mL 5  . Lancets (ACCU-CHEK SOFT TOUCH) lancets Use to check blood sugar ICD10:e11.8 Dispense based on insurance preference 100 each 12  . Lancets (ONETOUCH ULTRASOFT) lancets Use as instructed 100 each 12  . omeprazole (PRILOSEC) 20 MG capsule Take 1 capsule (20 mg total) by mouth daily. 90 capsule 3  . polyethylene glycol (MIRALAX) packet Take 17 g by mouth daily. Hold  for diarrhea. 28 each 1  . PREDNISONE PO Take 40 mg by mouth as needed.    . psyllium (HYDROCIL/METAMUCIL) 95 % PACK Take 1 packet by mouth 3 (three) times daily. 56 each 0  . RELION SHORT PEN NEEDLES 31G X 8 MM MISC USE ONE PEN NEEDLE ONCE DAILY AS DIRECTED 50 each 11  . Rivaroxaban (XARELTO) 15 MG TABS tablet Take 1 tablet (15 mg total) by mouth daily. (Patient taking differently: Take 15 mg by mouth every morning. ) 30 tablet 6  . simvastatin (ZOCOR) 20 MG tablet TAKE ONE TABLET ('20MG'$ ) BY MOUTH ONCE DAILY AT BEDTIME (Patient taking differently: Take 20 mg by mouth at bedtime. ) 90 tablet 3   No current facility-administered medications on file prior to visit.    Allergies  Allergen Reactions  . Ace Inhibitors Other (See Comments)    Hyperkalemia  . Actos [Pioglitazone] Swelling  . Aspirin Other (See Comments)    G.I. Upset. In high doses  . Metformin And Related Diarrhea  . Eliquis [Apixaban] Rash and Other (See Comments)    Patient states that this med was d/ced by prescriber due to interference with allergies and states that she also had a breakout   Social History   Socioeconomic History  . Marital status: Widowed    Spouse name: Not on file  . Number  of children: Not on file  . Years of education: Not on file  . Highest education level: Not on file  Occupational History  . Occupation: retired  Scientific laboratory technician  . Financial resource strain: Not on file  . Food insecurity:    Worry: Not on file    Inability: Not on file  . Transportation needs:    Medical: Not on file    Non-medical: Not on file  Tobacco Use  . Smoking status: Former Smoker    Packs/day: 0.50    Years: 50.00    Pack years: 25.00    Types: Cigarettes    Last attempt to quit: 01/19/2009    Years since quitting: 8.8  . Smokeless tobacco: Never Used  Substance and Sexual Activity  . Alcohol use: No    Alcohol/week: 0.0 standard drinks  . Drug use: No  . Sexual activity: Never  Lifestyle  . Physical activity:    Days per week: Not on file    Minutes per session: Not on file  . Stress: Not on file  Relationships  . Social connections:    Talks on phone: Not on file    Gets together: Not on file    Attends religious service: Not on file    Active member of club or organization: Not on file    Attends meetings of clubs or organizations: Not on file    Relationship status: Not on file  . Intimate partner violence:    Fear of current or ex partner: Not on file    Emotionally abused: Not on file    Physically abused: Not on file    Forced sexual activity: Not on file  Other Topics Concern  . Not on file  Social History Narrative   Entered 10/2013:   She has never driven.   She lives with her son.      Review of Systems  All other systems reviewed and are negative.      Objective:   Physical Exam  Constitutional: She appears well-developed and well-nourished. No distress.  HENT:  Head: Normocephalic and atraumatic.  Neck: Neck supple. No  JVD present.  Cardiovascular: Normal rate. An irregularly irregular rhythm present.  Pulmonary/Chest: Effort normal. No respiratory distress. She has no wheezes. She has rhonchi. She has no rales.  Abdominal:  Soft. Bowel sounds are normal. She exhibits no distension and no mass. There is no tenderness. There is no rebound and no guarding. No hernia.  Musculoskeletal: She exhibits edema.  Lymphadenopathy:    She has no cervical adenopathy.  Skin: She is not diaphoretic.  Vitals reviewed.         Assessment & Plan:  Chronic obstructive pulmonary disease, unspecified COPD type ([place]) - Plan: Fluticasone-Umeclidin-Vilant (TRELEGY ELLIPTA) 100-62.5-25 MCG/INH AEPB  Uncontrolled type 2 diabetes mellitus with hyperglycemia ([place])  Make no further changes in insulin.  Continue Levemir 20 units a day and continue NovoLog 7 units before lunch and dinner.  The reported sugars she brings in today should equate to a hemoglobin A1c around 7 which is where I would stop with this patient.  I am very happy that her breathing has improved.  I will continue the patient on Trelegy 1 inhalation a day.  Discontinue Pulmicort.

## 2017-12-11 NOTE — Progress Notes (Signed)
  Provider who saw the patient has left the practice.  Therefore I am unable to complete the note as I did not see the patient.  She will not be charged an office visit as her note is incomplete.  It will be closed at this time

## 2017-12-24 DIAGNOSIS — I509 Heart failure, unspecified: Secondary | ICD-10-CM | POA: Diagnosis not present

## 2017-12-24 DIAGNOSIS — J441 Chronic obstructive pulmonary disease with (acute) exacerbation: Secondary | ICD-10-CM | POA: Diagnosis not present

## 2017-12-24 DIAGNOSIS — J449 Chronic obstructive pulmonary disease, unspecified: Secondary | ICD-10-CM | POA: Diagnosis not present

## 2017-12-24 DIAGNOSIS — I5031 Acute diastolic (congestive) heart failure: Secondary | ICD-10-CM | POA: Diagnosis not present

## 2017-12-24 DIAGNOSIS — E1165 Type 2 diabetes mellitus with hyperglycemia: Secondary | ICD-10-CM | POA: Diagnosis not present

## 2017-12-24 DIAGNOSIS — J209 Acute bronchitis, unspecified: Secondary | ICD-10-CM | POA: Diagnosis not present

## 2017-12-24 DIAGNOSIS — A4189 Other specified sepsis: Secondary | ICD-10-CM | POA: Diagnosis not present

## 2017-12-24 DIAGNOSIS — I5033 Acute on chronic diastolic (congestive) heart failure: Secondary | ICD-10-CM | POA: Diagnosis not present

## 2017-12-24 DIAGNOSIS — I2783 Eisenmenger's syndrome: Secondary | ICD-10-CM | POA: Diagnosis not present

## 2017-12-25 DIAGNOSIS — I5033 Acute on chronic diastolic (congestive) heart failure: Secondary | ICD-10-CM | POA: Diagnosis not present

## 2017-12-25 DIAGNOSIS — E1165 Type 2 diabetes mellitus with hyperglycemia: Secondary | ICD-10-CM | POA: Diagnosis not present

## 2017-12-25 DIAGNOSIS — A4189 Other specified sepsis: Secondary | ICD-10-CM | POA: Diagnosis not present

## 2017-12-25 DIAGNOSIS — J209 Acute bronchitis, unspecified: Secondary | ICD-10-CM | POA: Diagnosis not present

## 2017-12-25 DIAGNOSIS — I5031 Acute diastolic (congestive) heart failure: Secondary | ICD-10-CM | POA: Diagnosis not present

## 2017-12-25 DIAGNOSIS — I2783 Eisenmenger's syndrome: Secondary | ICD-10-CM | POA: Diagnosis not present

## 2017-12-25 DIAGNOSIS — I509 Heart failure, unspecified: Secondary | ICD-10-CM | POA: Diagnosis not present

## 2017-12-25 DIAGNOSIS — J441 Chronic obstructive pulmonary disease with (acute) exacerbation: Secondary | ICD-10-CM | POA: Diagnosis not present

## 2017-12-25 DIAGNOSIS — J449 Chronic obstructive pulmonary disease, unspecified: Secondary | ICD-10-CM | POA: Diagnosis not present

## 2018-01-08 ENCOUNTER — Other Ambulatory Visit: Payer: Self-pay | Admitting: *Deleted

## 2018-01-08 MED ORDER — BLOOD GLUCOSE MONITORING SUPPL KIT: PACK | 0 refills | Status: DC

## 2018-01-10 NOTE — Progress Notes (Signed)
Cardiology Office Note    Date:  01/11/2018   ID:  Laura Best, DOB Apr 12, 1940, MRN [id number]  PCP:  Susy Frizzle, MD  Cardiologist: Carlyle Dolly, MD    Chief Complaint  Patient presents with  . Follow-up    3 month visit    History of Present Illness:    Laura Best is a 77 y.o. female with past medical history of CAD (cath in 05/2016 showing 50% LCx stenosis), chronic diastolic CHF, PAF (on Eliquis), history of ASD (s/p repair), HTN, HLD, Stage 3-4 CKD, and COPD who presents to the office today for 62-monthfollow-up.   She was last examined by Dr. BHarl Bowiein 09/2017 and reported having baseline dyspnea but denied any recent change in her symptoms. Weight had increased by approximately 5 pounds on her home scales to 175 lbs. It was recommended that she increase Lasix to 60 mg daily for 4 days and then go back to alternating between 60 mg daily and 40 mg daily.  In the [facility] of worsening dyspnea and productive cough. Was found in acute hypoxic respiratory failure in the setting of a COPD and CHF exacerbation. BNP was elevated at 398 and initial troponin was negative. She was treated with IV Lasix along with antibiotics, IV steroids and scheduled nebulizer therapy. Repeat echocardiogram was obtained and showed a preserved EF of 55 to 60%, no regional wall motion abnormalities, moderate LVH, severely dilated left atrium and right atrium, and no significant valve abnormalities. Discharged on Lasix 60 mg daily with creatinine stable at 1.86. Repeat labs were obtained by her PCP on 10/29/2017 and showed creatinine remained stable at 1.75. Labs were again obtained on 11/07/2017 and creatinine was further trending upwards to 1.96.  In talking with the patient today, she reports overall doing well from her recent hospitalization. Reports that her inhalers were adjusted and she was switched to Trelegy and has noted  significant improvement in her symptoms. Denies any recent dyspnea on exertion, orthopnea, PND, or lower extremity edema. No recent chest pain or palpitations.  She reports good compliance with her current medication regimen including Xarelto for anticoagulation.  Denies any evidence of active bleeding.   Past Medical History:  Diagnosis Date  . Arthritis    "hands, feet" (02/17/2016)  . CAD (coronary artery disease)    a. 05/2016: cath showing nonobstructive CAD with 50% prox-Cx stenosis  . CKD (chronic kidney disease) stage 3, GFR 30-59 ml/min (HCC) 12/25/2012  . COPD (chronic obstructive pulmonary disease) ([facility]   . Diastolic heart failure ([facility]   . Hyperlipidemia   . Hypertension   . Osteoporosis   . Pneumonia    "several times" (02/17/2016)  . Small bowel obstruction ([facility] 02/17/2016  . Tricuspid regurgitation    Echo 05/02/06-Nml LV. Mild MR. Mod TR  . Type II diabetes mellitus ([facility]   . Vitamin D deficiency     Past Surgical History:  Procedure Laterality Date  . APPENDECTOMY    . HEART CHAMBER REVISION     patched hole --1[place] . LAPAROSCOPIC APPENDECTOMY N/A 09/27/2013   Procedure: APPENDECTOMY - CONVERTED FROM LAPAROSCOPIC;  Surgeon: MDonnie Mesa MD;  [place]  Service: General;  Laterality: N/A;  . RIGHT/LEFT HEART CATH AND CORONARY ANGIOGRAPHY N/A 05/24/2016   Procedure: Right/Left Heart Cath and Coronary Angiography;  Surgeon: DLarey Dresser MD;  [facility];  Service: Cardiovascular;  Laterality: N/A;  . TUBAL LIGATION      Current Medications: Outpatient Medications Prior to Visit  Medication Sig Dispense Refill  . acetaminophen (TYLENOL) 500 MG tablet Take 500 mg by mouth every 6 (six) hours as needed for pain.    Marland Kitchen amLODipine (NORVASC) 10 MG tablet Take 0.5 tablets (5 mg total) by mouth daily.    . BD PEN NEEDLE MICRO U/F 32G X 6 MM MISC USE AS DIRECTED THREE TIMES DAILY 300 each 2  . Blood Glucose Monitoring Suppl KIT Use to  check blood sugar  Three times daily before meals and at bedtime.ICD10: E11.8.Please dispense based on insurance preference 1 each 0  . calcitRIOL (ROCALTROL) 0.25 MCG capsule Take 1 capsule (0.25 mcg total) by mouth every Monday, Wednesday, and Friday. 90 capsule 1  . Cholecalciferol (VITAMIN D) 2000 UNITS CAPS Take 2,000 Units by mouth daily.     . cloNIDine (CATAPRES) 0.3 MG tablet Take 0.3 mg by mouth 2 (two) times daily.    Marland Kitchen docusate sodium (COLACE) 100 MG capsule Take 1 capsule (100 mg total) by mouth 2 (two) times daily. 60 capsule 1  . Fluticasone-Umeclidin-Vilant (TRELEGY ELLIPTA) 100-62.5-25 MCG/INH AEPB Inhale 1 Inhaler into the lungs daily. 1 each 11  . furosemide (LASIX) 40 MG tablet Take 60 mg daily    . gabapentin (NEURONTIN) 100 MG capsule TAKE 1 TO 2 CAPSULES BY MOUTH AT BEDTIME FOR PERIPHERAL NEUROPATHY. 120 capsule 0  . glucose blood test strip Use as directed. Check blood sugar 3 times daily before meals and at bedtime. 100 each 12  . insulin aspart (NOVOLOG FLEXPEN) 100 UNIT/ML FlexPen INJECT 7 UNITS SUBCUTANEOUSLY WITH LUNCH AND SUPPER. INJECT 15 MINUTES PRIOR TO MEAL OR IMMEDIATELY AFTER MEAL. (Patient taking differently: Inject 7 Units into the skin 2 (two) times daily. INJECT 7 UNITS SUBCUTANEOUSLY WITH LUNCH AND SUPPER. INJECT 15 MINUTES PRIOR TO MEAL OR IMMEDIATELY AFTER MEAL.) 15 pen 11  . Insulin Detemir (LEVEMIR FLEXTOUCH) 100 UNIT/ML Pen INJECT 22 UNITS SUBCUTANEOUSLY ONCE DAILY AT  10  PM (Patient taking differently: Inject 20 Units into the skin at bedtime. SUBCUTANEOUSLY ONCE DAILY AT  10  PM) 15 pen 2  . ipratropium-albuterol (DUONEB) 0.5-2.5 (3) MG/3ML SOLN Take 3 mLs by nebulization every 6 (six) hours as needed. 360 mL 5  . Lancets (ACCU-CHEK SOFT TOUCH) lancets Use to check blood sugar ICD10:e11.8 Dispense based on insurance preference 100 each 12  . Lancets (ONETOUCH ULTRASOFT) lancets Use as instructed 100 each 12  . omeprazole (PRILOSEC) 20 MG capsule Take 1  capsule (20 mg total) by mouth daily. 90 capsule 3  . polyethylene glycol (MIRALAX) packet Take 17 g by mouth daily. Hold for diarrhea. 28 each 1  . PREDNISONE PO Take 40 mg by mouth as needed.    . psyllium (HYDROCIL/METAMUCIL) 95 % PACK Take 1 packet by mouth 3 (three) times daily. 56 each 0  . RELION SHORT PEN NEEDLES 31G X 8 MM MISC USE ONE PEN NEEDLE ONCE DAILY AS DIRECTED 50 each 11  . Rivaroxaban (XARELTO) 15 MG TABS tablet Take 1 tablet (15 mg total) by mouth daily. (Patient taking differently: Take 15 mg by mouth every morning. ) 30 tablet 6  . simvastatin (ZOCOR) 20 MG tablet TAKE ONE TABLET (20MG) BY MOUTH ONCE DAILY AT BEDTIME (Patient taking differently: Take 20 mg by mouth at bedtime. ) 90 tablet 3  . carvedilol (COREG) 12.5 MG tablet Take 1 tablet (12.5 mg total) by mouth 2 (  two) times daily. 180 tablet 3   No facility-administered medications prior to visit.      Allergies:   Ace inhibitors; Actos [pioglitazone]; Aspirin; Metformin and related; and Eliquis [apixaban]   Social History   Socioeconomic History  . Marital status: Widowed    Spouse name: Not on file  . Number of children: Not on file  . Years of education: Not on file  . Highest education level: Not on file  Occupational History  . Occupation: retired  Scientific laboratory technician  . Financial resource strain: Not on file  . Food insecurity:    Worry: Not on file    Inability: Not on file  . Transportation needs:    Medical: Not on file    Non-medical: Not on file  Tobacco Use  . Smoking status: Former Smoker    Packs/day: 0.50    Years: 50.00    Pack years: 25.00    Types: Cigarettes    Last attempt to quit: 01/19/2009    Years since quitting: 8.9  . Smokeless tobacco: Never Used  Substance and Sexual Activity  . Alcohol use: No    Alcohol/week: 0.0 standard drinks  . Drug use: No  . Sexual activity: Never  Lifestyle  . Physical activity:    Days per week: Not on file    Minutes per session: Not on file    . Stress: Not on file  Relationships  . Social connections:    Talks on phone: Not on file    Gets together: Not on file    Attends religious service: Not on file    Active member of club or organization: Not on file    Attends meetings of clubs or organizations: Not on file    Relationship status: Not on file  Other Topics Concern  . Not on file  Social History Narrative   Entered 10/2013:   She has never driven.   She lives with her son.     Family History:  The patient's family history includes Cancer in her brother, father, and sister; Diabetes in her father.   Review of Systems:   Please see the history of present illness.     General:  No chills, fever, night sweats or weight changes.  Cardiovascular:  No chest pain, dyspnea on exertion, orthopnea, palpitations, paroxysmal nocturnal dyspnea. Positive for edema (now improved).  Dermatological: No rash, lesions/masses Respiratory: No cough, dyspnea Urologic: No hematuria, dysuria Abdominal:   No nausea, vomiting, diarrhea, bright red blood per rectum, melena, or hematemesis Neurologic:  No visual changes, wkns, changes in mental status. All other systems reviewed and are otherwise negative except as noted above.   Physical Exam:    VS:  BP 136/66   Pulse 77   Ht _0  (1.6 m)   Wt 170 lb 6.4 oz (77.3 kg)   SpO2 97%   BMI 30.19 kg/m    General: Well developed, elderly Caucasian female appearing in no acute distress. Head: Normocephalic, atraumatic, sclera non-icteric, no xanthomas, nares are without discharge.  Neck: No carotid bruits. JVD not elevated.  Lungs: Respirations regular and unlabored, without wheezes or rales.  Heart: Irregularly irregular. No S3 or S4.  No murmur, no rubs, or gallops appreciated. Abdomen: Soft, non-tender, non-distended with normoactive bowel sounds. No hepatomegaly. No rebound/guarding. No obvious abdominal masses. Msk:  Strength and tone appear normal for age. No joint deformities or  effusions. Extremities: No clubbing or cyanosis. No lower extremity edema.  Distal pedal pulses are  2+ bilaterally. Neuro: Alert and oriented X 3. Moves all extremities spontaneously. No focal deficits noted. Psych:  Responds to questions appropriately with a normal affect. Skin: No rashes or lesions noted  Wt Readings from Last 3 Encounters:  01/11/18 170 lb 6.4 oz (77.3 kg)  12/11/17 170 lb (77.1 kg)  11/22/17 167 lb (75.8 kg)     Studies/Labs Reviewed:   EKG:  EKG is not ordered today.   Recent Labs: 01/14/2017: TSH 1.863 10/21/2017: ALT 16; B Natriuretic Peptide 398.0; Hemoglobin 14.1; Platelets 202 10/23/2017: Magnesium 2.2 11/07/2017: BUN 32; Creat 1.96; Potassium 4.2; Sodium 136   Lipid Panel    Component Value Date/Time   CHOL 162 09/20/2016 1123   TRIG 208 (H) 09/20/2016 1123   HDL 64 09/20/2016 1123   CHOLHDL 2.5 09/20/2016 1123   VLDL 42 (H) 09/20/2016 1123   LDLCALC 56 09/20/2016 1123    Additional studies/ records that were reviewed today include:   Cardiac Catheterization: 05/2016 1. Near-normal filling pressures.  2. No oxygen saturation step-up between RA and PA to suggest residual ASD.  3. Mild pulmonary hypertension with low PVR, suggests pulmonary venous hypertension.  4. Nonobstructive CAD, most significant disease being serial 50% stenoses in the proximal to mid LCx.    Would recommend aspirin and statin treatment for CAD.   She is on an adequate dose of Lasix, would not change.   Hydration post-procedure given CKD.   Echocardiogram: 09/2017 Study Conclusions  - Left ventricle: The cavity size was normal. Wall thickness was   increased increased in a pattern of mild to moderate LVH.   Systolic function was normal. The estimated ejection fraction was   in the range of 55% to 60%. Wall motion was normal; there were no   regional wall motion abnormalities. The study is not technically   sufficient to allow evaluation of LV diastolic  function. - Aortic valve: Mildly calcified annulus. Trileaflet; mildly   thickened leaflets. Valve area (VTI): 2.42 cm^2. Valve area   (Vmax): 2.42 cm^2. - Mitral valve: Mildly calcified annulus. Mildly thickened leaflets   . - Left atrium: The atrium was severely dilated. - Right ventricle: The cavity size was mildly dilated. Systolic   function was mildly reduced. - Right atrium: The atrium was severely dilated. - Atrial septum: No defect or patent foramen ovale was identified. - Pulmonary arteries: Systolic pressure was mildly increased. PA   peak pressure: 33 mm Hg (S). - Technicallya adequate study.   Assessment:    1. Chronic diastolic congestive heart failure ([place])   2. Coronary artery disease involving native coronary artery of native heart without angina pectoris   3. PAF (paroxysmal atrial fibrillation) ([place])   4. Current use of long term anticoagulation   5. Essential hypertension   6. Mixed hyperlipidemia   7. CKD (chronic kidney disease) stage 3, GFR 30-59 ml/min (HCC)   8. Medication management      Plan:   In order of problems listed above:  1. Chronic Diastolic CHF - Most recent echocardiogram showed that her EF remained preserved at 55 to 60%. Reports that weight has overall been stable on her home scales and she denies any recent dyspnea on exertion, orthopnea, PND, or lower extremity edema. She was previously on Lasix 60 mg daily alternating with 40 mg daily and this was titrated to 30m daily at the time of recent hospital discharge. Will recheck a BMET today as creatinine was trending upwards when checked in 10/2017. If overall stable,  would continue at current dosing of 4m daily. If kidney function has worsened, will reduce back to 647mdaily alternating with 4039maily.   2. CAD - s/p cardiac catheterization in 05/2016 showing 50% LCx stenosis. She denies any recurrent anginal symptoms. Continue with risk factor modification. - remains on BB and statin  therapy. No ASA given the need for anticoagulation.   3. Paroxysmal Atrial Fibrillation/ Use of Long-Term Anticoagulation - She denies any recent palpitations and heart rate is well controlled in the 70's during today's visit.  Continue Coreg 12.5 mg twice daily for rate control. - Denies any evidence of active bleeding. Remains on Xarelto 15 mg daily (reduced dosing given creatinine clearance less than 50).  4. HTN - BP is well controlled at 136/66 during today's visit. Continue Amlodipine 5 mg daily, Coreg 12.5 mg twice daily, and Clonidine 0.3 mg twice daily  5. HLD - Followed by PCP. Goal LDL is less than 70 in the setting of known CAD. She remains on Simvastatin 20 mg daily.  6. Stage 3 CKD - Baseline creatinine 1.7 - 1.8. Was trending upwards by repeat labs on 11/07/2017 as creatinine was elevated to 1.96.  Will obtain a repeat BMET today.    Medication Adjustments/Labs and Tests Ordered: Current medicines are reviewed at length with the patient today.  Concerns regarding medicines are outlined above.  Medication changes, Labs and Tests ordered today are listed in the Patient Instructions below. Patient Instructions  Medication Instructions:  Your physician recommends that you continue on your current medications as directed. Please refer to the Current Medication list given to you today.  If you need a refill on your cardiac medications before your next appointment, please call your pharmacy.   Lab work: Your physician recommends that you return for lab work in: Today  If you have labs (blood work) drawn today and your tests are completely normal, you will receive your results only by: . MMarland KitchenChart Message (if you have MyChart) OR . A paper copy in the mail If you have any lab test that is abnormal or we need to change your treatment, we will call you to review the results.  Testing/Procedures: NONE    Follow-Up: [facility] and [facility] needs are our [facility].   As part of our continuing mission to provide you with exceptional heart care, we have created designated Provider Care Teams.  These Care Teams include your primary Cardiologist (physician) and Advanced Practice Providers (APPs -  Physician Assistants and Nurse Practitioners) who all work together to provide you with the care you need, when you need it. You will need a follow up appointment in 6 months.  Please call our office 2 months in advance to schedule this appointment.  You may see BraCarlyle DollyD or one of the following Advanced Practice Providers on your designated Care Team:   BriBernerd PhoA-C ([facility] MicErmalinda BarriosA-C (ReiDraperAny Other Special Instructions Will Be Listed Below (If Applicable). Thank you for choosing [facility]  Signed, BriErma HeritageA-C  01/11/2018 1:49 PM    [facility] [address] [id number]: [phone]

## 2018-01-11 ENCOUNTER — Encounter: Payer: Self-pay | Admitting: Student

## 2018-01-11 ENCOUNTER — Other Ambulatory Visit (HOSPITAL_COMMUNITY)
Admission: RE | Admit: 2018-01-11 | Discharge: 2018-01-11 | Disposition: A | Discharge status: Home / Self Care | Payer: Medicare HMO | Source: Ambulatory Visit | Attending: Student | Admitting: Student

## 2018-01-11 ENCOUNTER — Ambulatory Visit: Payer: Medicare HMO | Admitting: Student

## 2018-01-11 VITALS — BP 136/66 | HR 77 | Ht 63.0 in | Wt 170.4 lb

## 2018-01-11 DIAGNOSIS — Z79899 Other long term (current) drug therapy: Secondary | ICD-10-CM | POA: Insufficient documentation

## 2018-01-11 DIAGNOSIS — I2510 Atherosclerotic heart disease of native coronary artery without angina pectoris: Secondary | ICD-10-CM | POA: Diagnosis not present

## 2018-01-11 DIAGNOSIS — E782 Mixed hyperlipidemia: Secondary | ICD-10-CM | POA: Diagnosis not present

## 2018-01-11 DIAGNOSIS — Z7901 Long term (current) use of anticoagulants: Secondary | ICD-10-CM

## 2018-01-11 DIAGNOSIS — N183 Chronic kidney disease, stage 3 unspecified: Secondary | ICD-10-CM

## 2018-01-11 DIAGNOSIS — I480 Paroxysmal atrial fibrillation: Secondary | ICD-10-CM

## 2018-01-11 DIAGNOSIS — I5032 Chronic diastolic (congestive) heart failure: Secondary | ICD-10-CM

## 2018-01-11 DIAGNOSIS — I10 Essential (primary) hypertension: Secondary | ICD-10-CM | POA: Diagnosis not present

## 2018-01-11 LAB — BASIC METABOLIC PANEL
ANION GAP: 8 (ref 5–15)
BUN: 31 mg/dL — ABNORMAL HIGH (ref 8–23)
CO2: 24 mmol/L (ref 22–32)
Calcium: 9 mg/dL (ref 8.9–10.3)
Chloride: 104 mmol/L (ref 98–111)
Creatinine, Ser: 1.54 mg/dL — ABNORMAL HIGH (ref 0.44–1.00)
GFR, EST AFRICAN AMERICAN: 37 mL/min — AB (ref >60)
GFR, EST NON AFRICAN AMERICAN: 32 mL/min — AB (ref >60)
GLUCOSE: 312 mg/dL — AB (ref 70–99)
POTASSIUM: 4.3 mmol/L (ref 3.5–5.1)
Sodium: 136 mmol/L (ref 135–145)

## 2018-01-11 NOTE — Patient Instructions (Signed)
Medication Instructions:  Your physician recommends that you continue on your current medications as directed. Please refer to the Current Medication list given to you today.  If you need a refill on your cardiac medications before your next appointment, please call your pharmacy.   Lab work: Your physician recommends that you return for lab work in: Today  If you have labs (blood work) drawn today and your tests are completely normal, you will receive your results only by: Marland Kitchen MyChart Message (if you have MyChart) OR . A paper copy in the mail If you have any lab test that is abnormal or we need to change your treatment, we will call you to review the results.  Testing/Procedures: NONE    Follow-Up: At [facility], you and your health needs are our priority.  As part of our continuing mission to provide you with exceptional heart care, we have created designated Provider Care Teams.  These Care Teams include your primary Cardiologist (physician) and Advanced Practice Providers (APPs -  Physician Assistants and Nurse Practitioners) who all work together to provide you with the care you need, when you need it. You will need a follow up appointment in 6 months.  Please call our office 2 months in advance to schedule this appointment.  You may see Carlyle Dolly, MD or one of the following Advanced Practice Providers on your designated Care Team:   Bernerd Pho, PA-C [facility]) . Ermalinda Barrios, PA-C ([place])  Any Other Special Instructions Will Be Listed Below (If Applicable). Thank you for choosing [facility]!

## 2018-01-17 ENCOUNTER — Other Ambulatory Visit: Payer: Self-pay | Admitting: *Deleted

## 2018-01-24 DIAGNOSIS — J441 Chronic obstructive pulmonary disease with (acute) exacerbation: Secondary | ICD-10-CM | POA: Diagnosis not present

## 2018-01-24 DIAGNOSIS — I2783 Eisenmenger's syndrome: Secondary | ICD-10-CM | POA: Diagnosis not present

## 2018-01-25 DIAGNOSIS — I5033 Acute on chronic diastolic (congestive) heart failure: Secondary | ICD-10-CM | POA: Diagnosis not present

## 2018-01-25 DIAGNOSIS — I2783 Eisenmenger's syndrome: Secondary | ICD-10-CM | POA: Diagnosis not present

## 2018-02-08 ENCOUNTER — Other Ambulatory Visit: Payer: Self-pay | Admitting: Family Medicine

## 2018-02-08 DIAGNOSIS — E118 Type 2 diabetes mellitus with unspecified complications: Secondary | ICD-10-CM

## 2018-02-08 DIAGNOSIS — Z794 Long term (current) use of insulin: Secondary | ICD-10-CM

## 2018-02-08 DIAGNOSIS — E1142 Type 2 diabetes mellitus with diabetic polyneuropathy: Secondary | ICD-10-CM

## 2018-02-08 MED ORDER — GABAPENTIN 100 MG PO CAPS: ORAL_CAPSULE | ORAL | 3 refills | Status: DC

## 2018-02-08 MED ORDER — GLUCOSE BLOOD VI STRP: ORAL_STRIP | 3 refills | Status: DC

## 2018-02-08 MED ORDER — SIMVASTATIN 20 MG PO TABS: ORAL_TABLET | ORAL | 3 refills | Status: DC

## 2018-02-08 MED ORDER — INSULIN DETEMIR 100 UNIT/ML FLEXPEN: 20.0000 [IU] | PEN_INJECTOR | Freq: Every day | SUBCUTANEOUS | 3 refills | Status: DC

## 2018-02-08 MED ORDER — CARVEDILOL 12.5 MG PO TABS: 12.5000 mg | ORAL_TABLET | Freq: Two times a day (BID) | ORAL | 3 refills | Status: DC

## 2018-02-08 MED ORDER — AMLODIPINE BESYLATE 10 MG PO TABS: 5.0000 mg | ORAL_TABLET | Freq: Every day | ORAL | 3 refills | Status: DC

## 2018-02-08 MED ORDER — CLONIDINE HCL 0.3 MG PO TABS: 0.3000 mg | ORAL_TABLET | Freq: Two times a day (BID) | ORAL | 3 refills | Status: DC

## 2018-02-08 MED ORDER — OMEPRAZOLE 20 MG PO CPDR: 20.0000 mg | DELAYED_RELEASE_CAPSULE | Freq: Every day | ORAL | 3 refills | Status: DC

## 2018-02-08 MED ORDER — CALCITRIOL 0.25 MCG PO CAPS: 0.2500 ug | ORAL_CAPSULE | ORAL | 1 refills | Status: DC

## 2018-02-08 MED ORDER — FUROSEMIDE 40 MG PO TABS: 60.0000 mg | ORAL_TABLET | Freq: Every day | ORAL | 3 refills | Status: DC

## 2018-02-08 MED ORDER — RIVAROXABAN 15 MG PO TABS: 15.0000 mg | ORAL_TABLET | Freq: Every day | ORAL | 3 refills | Status: DC

## 2018-02-09 ENCOUNTER — Other Ambulatory Visit: Payer: Self-pay | Admitting: *Deleted

## 2018-02-09 DIAGNOSIS — J449 Chronic obstructive pulmonary disease, unspecified: Secondary | ICD-10-CM

## 2018-02-09 DIAGNOSIS — E1142 Type 2 diabetes mellitus with diabetic polyneuropathy: Secondary | ICD-10-CM

## 2018-02-09 MED ORDER — SIMVASTATIN 20 MG PO TABS: ORAL_TABLET | ORAL | 3 refills | Status: DC

## 2018-02-09 MED ORDER — FUROSEMIDE 40 MG PO TABS: 60.0000 mg | ORAL_TABLET | Freq: Every day | ORAL | 3 refills | Status: DC

## 2018-02-09 MED ORDER — FLUTICASONE-UMECLIDIN-VILANT 100-62.5-25 MCG/INH IN AEPB: 1.0000 | INHALATION_SPRAY | Freq: Every day | RESPIRATORY_TRACT | 11 refills | Status: DC

## 2018-02-09 MED ORDER — AMLODIPINE BESYLATE 10 MG PO TABS: 5.0000 mg | ORAL_TABLET | Freq: Every day | ORAL | 3 refills | Status: DC

## 2018-02-09 MED ORDER — RIVAROXABAN 15 MG PO TABS: 15.0000 mg | ORAL_TABLET | Freq: Every day | ORAL | 3 refills | Status: DC

## 2018-02-09 MED ORDER — OMEPRAZOLE 20 MG PO CPDR: 20.0000 mg | DELAYED_RELEASE_CAPSULE | Freq: Every day | ORAL | 3 refills | Status: DC

## 2018-02-09 MED ORDER — CLONIDINE HCL 0.3 MG PO TABS: 0.3000 mg | ORAL_TABLET | Freq: Two times a day (BID) | ORAL | 3 refills | Status: DC

## 2018-02-09 MED ORDER — INSULIN DETEMIR 100 UNIT/ML FLEXPEN: 20.0000 [IU] | PEN_INJECTOR | Freq: Every day | SUBCUTANEOUS | 3 refills | Status: DC

## 2018-02-09 MED ORDER — CARVEDILOL 12.5 MG PO TABS: 12.5000 mg | ORAL_TABLET | Freq: Two times a day (BID) | ORAL | 3 refills | Status: DC

## 2018-02-09 MED ORDER — GABAPENTIN 100 MG PO CAPS: ORAL_CAPSULE | ORAL | 3 refills | Status: DC

## 2018-02-09 MED ORDER — CALCITRIOL 0.25 MCG PO CAPS: 0.2500 ug | ORAL_CAPSULE | ORAL | 1 refills | Status: DC

## 2018-02-18 ENCOUNTER — Ambulatory Visit (INDEPENDENT_AMBULATORY_CARE_PROVIDER_SITE_OTHER): Payer: Medicare Other | Admitting: Family Medicine

## 2018-02-18 ENCOUNTER — Encounter: Payer: Self-pay | Admitting: Family Medicine

## 2018-02-18 VITALS — BP 144/70 | HR 72 | Temp 98.1°F | Resp 16 | Ht 63.0 in | Wt 171.0 lb

## 2018-02-18 DIAGNOSIS — I5033 Acute on chronic diastolic (congestive) heart failure: Secondary | ICD-10-CM | POA: Diagnosis not present

## 2018-02-18 DIAGNOSIS — J449 Chronic obstructive pulmonary disease, unspecified: Secondary | ICD-10-CM | POA: Diagnosis not present

## 2018-02-18 DIAGNOSIS — N183 Chronic kidney disease, stage 3 unspecified: Secondary | ICD-10-CM

## 2018-02-18 DIAGNOSIS — K5792 Diverticulitis of intestine, part unspecified, without perforation or abscess without bleeding: Secondary | ICD-10-CM

## 2018-02-18 DIAGNOSIS — E1165 Type 2 diabetes mellitus with hyperglycemia: Secondary | ICD-10-CM | POA: Diagnosis not present

## 2018-02-18 DIAGNOSIS — I482 Chronic atrial fibrillation, unspecified: Secondary | ICD-10-CM | POA: Diagnosis not present

## 2018-02-18 MED ORDER — ACCU-CHEK SOFTCLIX LANCETS MISC: 5 refills | Status: DC

## 2018-02-18 MED ORDER — ACCU-CHEK AVIVA PLUS W/DEVICE KIT: PACK | 1 refills | Status: DC

## 2018-02-18 MED ORDER — GLUCOSE BLOOD VI STRP: ORAL_STRIP | 5 refills | Status: DC

## 2018-02-18 MED ORDER — ACCU-CHEK SOFTCLIX LANCET DEV KIT: PACK | 1 refills | Status: DC

## 2018-02-18 MED ORDER — METRONIDAZOLE 500 MG PO TABS: 500.0000 mg | ORAL_TABLET | Freq: Two times a day (BID) | ORAL | 0 refills | Status: DC

## 2018-02-18 MED ORDER — CIPROFLOXACIN HCL 500 MG PO TABS: 500.0000 mg | ORAL_TABLET | Freq: Two times a day (BID) | ORAL | 0 refills | Status: DC

## 2018-02-18 NOTE — Progress Notes (Signed)
Subjective:    Patient ID: Laura Best, female    DOB: [birth year], 78 y.o.   MRN: [id number]  HPI  11/22/17 Patient is here today to establish care with me.  Her previous primary care provider has left the practice.  Given her complicated medical history, I wanted to see the patient to determine her overall medical management.  First, the patient has insulin-dependent diabetes mellitus.  She is currently using Levemir 14 units a day.  She takes 7 units of NovoLog with lunch and supper.  Fasting blood sugars range between 89 and 180 with the majority being between 100-140.  Lunchtime sugars are typically 1 80-280 with the majority around 230.  Suppertime sugars are between 206 and 255.  Bedtime sugars are between 140 and 200.  Second, the patient has chronic diastolic heart failure.  She is frequently admitted to the hospital due to pulmonary edema.  Third she has COPD.  She has been treated several times in the past for COPD exacerbations according to her records.  On exam today, she has diminished breath sounds bilaterally with expiratory wheezes and rhonchorous breath sounds in both bases.  She is not on any maintenance therapy.  She reports dyspnea on exertion.  She also reports dyspnea with minimal activity.  At that time, my plan was: First going to work on her blood sugars.  I want to increase her Levemir from 14 units a day to 20 units a day.  Continue the 7 units of NovoLog with lunch and supper.  Recheck blood sugars in 2 weeks.  Second I want to work on her breathing.  She has diminished breath sounds bilaterally with expiratory wheezing and rhonchorous breath sounds.  I believe she would benefit from a daily maintenance therapy.  Therefore I will start the patient on Trelegy 1 inhalation a day.  I have given her samples.  I would like to see her back in 2 weeks to see if her breathing has improved on the maintenance therapy.  I hope that this also will help control or prevent future  exacerbations and reduce her chance of hospitalization.  12/11/17 I am ecstatic!.  Patient's fasting blood sugars are ranging between 67 and 137 however the vast majority are between 100-130.  Her prelunch sugars are between 150 and 180.  Her predinner sugars are between 111 and 193.  Her bedtime sugars are between 130 and 183.  She only has 2 values greater than 200.  This is much better than last time.  She also states that her breathing has improved dramatically since starting Trelegy.  She would like to continue this medication.  On examination today she does have faint bibasilar crackles however her expiratory wheezing has improved dramatically and her breath sounds have improved with improved aeration and more air movement.  At that time, my plan was: Make no further changes in insulin.  Continue Levemir 20 units a day and continue NovoLog 7 units before lunch and dinner.  The reported sugars she brings in today should equate to a hemoglobin A1c around 7 which is where I would stop with this patient.  I am very happy that her breathing has improved.  I will continue the patient on Trelegy 1 inhalation a day.  Discontinue Pulmicort.  02/18/18 Patient is here today complaining of one-week history of pain in her left lower quadrant.  She also reports nausea and vomiting.  She is tender to palpation in the left lower quadrant with voluntary guarding.  She denies any rebound.  Bowel sounds are diminished throughout.  She is also been constipated.  She denies any diarrhea.  She denies any melena.  She denies any hematochezia.  She is overdue for lab work to monitor her blood sugar. Past Medical History:  Diagnosis Date  . Arthritis    "hands, feet" (02/17/2016)  . CAD (coronary artery disease)    a. 05/2016: cath showing nonobstructive CAD with 50% prox-Cx stenosis  . CKD (chronic kidney disease) stage 3, GFR 30-59 ml/min (HCC) 12/25/2012  . COPD (chronic obstructive pulmonary disease) ([place])   .  Diastolic heart failure ([place])   . Hyperlipidemia   . Hypertension   . Osteoporosis   . Pneumonia    "several times" (02/17/2016)  . Small bowel obstruction ([place]) 02/17/2016  . Tricuspid regurgitation    Echo 05/02/06-Nml LV. Mild MR. Mod TR  . Type II diabetes mellitus ([place])   . Vitamin D deficiency    Past Surgical History:  Procedure Laterality Date  . APPENDECTOMY    . HEART CHAMBER REVISION     patched hole --[place]  . LAPAROSCOPIC APPENDECTOMY N/A 09/27/2013   Procedure: APPENDECTOMY - CONVERTED FROM LAPAROSCOPIC;  Surgeon: Donnie Mesa, MD;  Location: [facility];  Service: General;  Laterality: N/A;  . RIGHT/LEFT HEART CATH AND CORONARY ANGIOGRAPHY N/A 05/24/2016   Procedure: Right/Left Heart Cath and Coronary Angiography;  Surgeon: Larey Dresser, MD;  Location: [facility];  Service: Cardiovascular;  Laterality: N/A;  . TUBAL LIGATION     Current Outpatient Medications on File Prior to Visit  Medication Sig Dispense Refill  . acetaminophen (TYLENOL) 500 MG tablet Take 500 mg by mouth every 6 (six) hours as needed for pain.    Marland Kitchen amLODipine (NORVASC) 10 MG tablet Take 0.5 tablets (5 mg total) by mouth daily. 45 tablet 3  . BD PEN NEEDLE MICRO U/F 32G X 6 MM MISC USE AS DIRECTED THREE TIMES DAILY 300 each 2  . Blood Glucose Monitoring Suppl KIT Use to check blood sugar  Three times daily before meals and at bedtime.ICD10: E11.8.Please dispense based on insurance preference 1 each 0  . calcitRIOL (ROCALTROL) 0.25 MCG capsule Take 1 capsule (0.25 mcg total) by mouth every Monday, Wednesday, and Friday. 90 capsule 1  . carvedilol (COREG) 12.5 MG tablet Take 1 tablet (12.5 mg total) by mouth 2 (two) times daily. 180 tablet 3  . Cholecalciferol (VITAMIN D) 2000 UNITS CAPS Take 2,000 Units by mouth daily.     . cloNIDine (CATAPRES) 0.3 MG tablet Take 1 tablet (0.3 mg total) by mouth 2 (two) times daily. 180 tablet 3  . docusate sodium (COLACE) 100 MG capsule Take 1 capsule (100 mg  total) by mouth 2 (two) times daily. 60 capsule 1  . Fluticasone-Umeclidin-Vilant (TRELEGY ELLIPTA) 100-62.5-25 MCG/INH AEPB Inhale 1 Inhaler into the lungs daily. 1 each 11  . furosemide (LASIX) 40 MG tablet Take 1.5 tablets (60 mg total) by mouth daily. Take 60 mg daily 135 tablet 3  . gabapentin (NEURONTIN) 100 MG capsule TAKE 1 TO 2 CAPSULES BY MOUTH AT BEDTIME FOR PERIPHERAL NEUROPATHY. 180 capsule 3  . glucose blood test strip Use as directed. Check blood sugar 3 times daily before meals and at bedtime. 300 each 3  . insulin aspart (NOVOLOG FLEXPEN) 100 UNIT/ML FlexPen INJECT 7 UNITS SUBCUTANEOUSLY WITH LUNCH AND SUPPER. INJECT 15 MINUTES PRIOR TO MEAL OR IMMEDIATELY AFTER MEAL. (Patient taking differently: Inject 7 Units into the skin  2 (two) times daily. INJECT 7 UNITS SUBCUTANEOUSLY WITH LUNCH AND SUPPER. INJECT 15 MINUTES PRIOR TO MEAL OR IMMEDIATELY AFTER MEAL.) 15 pen 11  . Insulin Detemir (LEVEMIR FLEXTOUCH) 100 UNIT/ML Pen Inject 20 Units into the skin at bedtime. SUBCUTANEOUSLY ONCE DAILY AT  10  PM 15 pen 3  . ipratropium-albuterol (DUONEB) 0.5-2.5 (3) MG/3ML SOLN Take 3 mLs by nebulization every 6 (six) hours as needed. 360 mL 5  . Lancets (ACCU-CHEK SOFT TOUCH) lancets Use to check blood sugar ICD10:e11.8 Dispense based on insurance preference 100 each 12  . Lancets (ONETOUCH ULTRASOFT) lancets Use as instructed 100 each 12  . omeprazole (PRILOSEC) 20 MG capsule Take 1 capsule (20 mg total) by mouth daily. 90 capsule 3  . polyethylene glycol (MIRALAX) packet Take 17 g by mouth daily. Hold for diarrhea. 28 each 1  . PREDNISONE PO Take 40 mg by mouth as needed.    . psyllium (HYDROCIL/METAMUCIL) 95 % PACK Take 1 packet by mouth 3 (three) times daily. 56 each 0  . RELION SHORT PEN NEEDLES 31G X 8 MM MISC USE ONE PEN NEEDLE ONCE DAILY AS DIRECTED 50 each 11  . Rivaroxaban (XARELTO) 15 MG TABS tablet Take 1 tablet (15 mg total) by mouth daily. 90 tablet 3  . simvastatin (ZOCOR) 20 MG  tablet TAKE ONE TABLET (20MG) BY MOUTH ONCE DAILY AT BEDTIME 90 tablet 3   No current facility-administered medications on file prior to visit.    Allergies  Allergen Reactions  . Ace Inhibitors Other (See Comments)    Hyperkalemia  . Actos [Pioglitazone] Swelling  . Aspirin Other (See Comments)    G.I. Upset. In high doses  . Metformin And Related Diarrhea  . Eliquis [Apixaban] Rash and Other (See Comments)    Patient states that this med was d/ced by prescriber due to interference with allergies and states that she also had a breakout   Social History   Socioeconomic History  . Marital status: Widowed    Spouse name: Not on file  . Number of children: Not on file  . Years of education: Not on file  . Highest education level: Not on file  Occupational History  . Occupation: retired  Scientific laboratory technician  . Financial resource strain: Not on file  . Food insecurity:    Worry: Not on file    Inability: Not on file  . Transportation needs:    Medical: Not on file    Non-medical: Not on file  Tobacco Use  . Smoking status: Former Smoker    Packs/day: 0.50    Years: 50.00    Pack years: 25.00    Types: Cigarettes    Last attempt to quit: 01/19/2009    Years since quitting: 9.0  . Smokeless tobacco: Never Used  Substance and Sexual Activity  . Alcohol use: No    Alcohol/week: 0.0 standard drinks  . Drug use: No  . Sexual activity: Never  Lifestyle  . Physical activity:    Days per week: Not on file    Minutes per session: Not on file  . Stress: Not on file  Relationships  . Social connections:    Talks on phone: Not on file    Gets together: Not on file    Attends religious service: Not on file    Active member of club or organization: Not on file    Attends meetings of clubs or organizations: Not on file    Relationship status: Not on file  .  Intimate partner violence:    Fear of current or ex partner: Not on file    Emotionally abused: Not on file    Physically  abused: Not on file    Forced sexual activity: Not on file  Other Topics Concern  . Not on file  Social History Narrative   Entered 10/2013:   She has never driven.   She lives with her son.      Review of Systems  All other systems reviewed and are negative.      Objective:   Physical Exam Vitals signs reviewed.  Constitutional:      General: She is not in acute distress.    Appearance: She is well-developed. She is not diaphoretic.  HENT:     Head: Normocephalic and atraumatic.  Neck:     Musculoskeletal: Neck supple.     Vascular: No JVD.  Cardiovascular:     Rate and Rhythm: Normal rate. Rhythm irregularly irregular.  Pulmonary:     Effort: Pulmonary effort is normal. No respiratory distress.     Breath sounds: No wheezing, rhonchi or rales.  Abdominal:     General: Bowel sounds are normal. There is no distension.     Palpations: Abdomen is soft. There is no mass.     Tenderness: There is abdominal tenderness. There is no guarding or rebound.     Hernia: No hernia is present.  Lymphadenopathy:     Cervical: No cervical adenopathy.           Assessment & Plan:  Uncontrolled type 2 diabetes mellitus with hyperglycemia (HCC) - Plan: Hemoglobin A1c, CBC with Differential/Platelet, COMPLETE METABOLIC PANEL WITH GFR, Lipid panel  Chronic obstructive pulmonary disease, unspecified COPD type (HCC)  Atrial fibrillation, chronic  Diastolic CHF, acute on chronic (HCC)  CKD (chronic kidney disease) stage 3, GFR 30-59 ml/min (HCC) Left lower quadrant abdominal pain concerning for diverticulitis   I believe the patient has diverticulitis.  I will start the patient on Cipro 500 mg p.o. twice daily for 10 days and Flagyl 500 mg p.o. twice daily for 10 days and then reassess in 1 week.  While the patient is here I will check a CBC, CMP, fasting lipid panel, and hemoglobin A1c to monitor the management of her diabetes.  Goal hemoglobin A1c is less than 7.5.  Goal LDL  cholesterol is less than 100.  Her COPD appears stable.  She is not wheezing today on exam.  She has no rhonchi. There is no evidence of fluid overload regarding her diastolic congestive heart failure.  I will monitor a CMP to monitor her chronic kidney disease.  If abdominal pain worsens, she will need a CT scan.  Add Linzess 145 mcg p.o. daily for constipation

## 2018-02-19 ENCOUNTER — Encounter: Payer: Self-pay | Admitting: *Deleted

## 2018-02-19 LAB — COMPLETE METABOLIC PANEL WITH GFR
AG Ratio: 1.8 (calc) (ref 1.0–2.5)
ALT: 12 U/L (ref 6–29)
AST: 14 U/L (ref 10–35)
Albumin: 4.3 g/dL (ref 3.6–5.1)
Alkaline phosphatase (APISO): 77 U/L (ref 33–130)
BUN/Creatinine Ratio: 20 (calc) (ref 6–22)
BUN: 35 mg/dL — ABNORMAL HIGH (ref 7–25)
CO2: 31 mmol/L (ref 20–32)
Calcium: 9.4 mg/dL (ref 8.6–10.4)
Chloride: 103 mmol/L (ref 98–110)
Creat: 1.71 mg/dL — ABNORMAL HIGH (ref 0.60–0.93)
GFR, Est African American: 33 mL/min/{1.73_m2} — ABNORMAL LOW (ref >=60)
GFR, Est Non African American: 28 mL/min/{1.73_m2} — ABNORMAL LOW (ref >=60)
Globulin: 2.4 g/dL (calc) (ref 1.9–3.7)
Glucose, Bld: 68 mg/dL (ref 65–99)
Potassium: 4.1 mmol/L (ref 3.5–5.3)
Sodium: 143 mmol/L (ref 135–146)
Total Bilirubin: 0.4 mg/dL (ref 0.2–1.2)
Total Protein: 6.7 g/dL (ref 6.1–8.1)

## 2018-02-19 LAB — CBC WITH DIFFERENTIAL/PLATELET
Absolute Monocytes: 830 cells/uL (ref 200–950)
BASOS ABS: 58 {cells}/uL (ref 0–200)
Basophils Relative: 0.7 %
Eosinophils Absolute: 158 cells/uL (ref 15–500)
Eosinophils Relative: 1.9 %
HCT: 35.8 % (ref 35.0–45.0)
Hemoglobin: 11.9 g/dL (ref 11.7–15.5)
Lymphs Abs: 1336 cells/uL (ref 850–3900)
MCH: 28.4 pg (ref 27.0–33.0)
MCHC: 33.2 g/dL (ref 32.0–36.0)
MCV: 85.4 fL (ref 80.0–100.0)
MPV: 11.6 fL (ref 7.5–12.5)
Monocytes Relative: 10 %
Neutro Abs: 5918 cells/uL (ref 1500–7800)
Neutrophils Relative %: 71.3 %
PLATELETS: 231 10*3/uL (ref 140–400)
RBC: 4.19 10*6/uL (ref 3.80–5.10)
RDW: 14.5 % (ref 11.0–15.0)
Total Lymphocyte: 16.1 %
WBC: 8.3 10*3/uL (ref 3.8–10.8)

## 2018-02-19 LAB — HEMOGLOBIN A1C
Hgb A1c MFr Bld: 7.6 % of total Hgb — ABNORMAL HIGH (ref <5.7)
Mean Plasma Glucose: 171 (calc)
eAG (mmol/L): 9.5 (calc)

## 2018-02-19 LAB — LIPID PANEL
Cholesterol: 141 mg/dL (ref <200)
HDL: 53 mg/dL (ref >50)
LDL Cholesterol (Calc): 63 mg/dL (calc)
Non-HDL Cholesterol (Calc): 88 mg/dL (calc) (ref <130)
Total CHOL/HDL Ratio: 2.7 (calc) (ref <5.0)
Triglycerides: 168 mg/dL — ABNORMAL HIGH (ref <150)

## 2018-02-24 DIAGNOSIS — J441 Chronic obstructive pulmonary disease with (acute) exacerbation: Secondary | ICD-10-CM | POA: Diagnosis not present

## 2018-02-24 DIAGNOSIS — I2783 Eisenmenger's syndrome: Secondary | ICD-10-CM | POA: Diagnosis not present

## 2018-02-25 DIAGNOSIS — I5033 Acute on chronic diastolic (congestive) heart failure: Secondary | ICD-10-CM | POA: Diagnosis not present

## 2018-02-25 DIAGNOSIS — I2783 Eisenmenger's syndrome: Secondary | ICD-10-CM | POA: Diagnosis not present

## 2018-03-04 ENCOUNTER — Telehealth: Payer: Self-pay | Admitting: Family Medicine

## 2018-03-04 NOTE — Telephone Encounter (Signed)
linzess 145 mcg poqday

## 2018-03-04 NOTE — Telephone Encounter (Signed)
Patient would like to know if she can get some medicine called in for constipation  [phone]

## 2018-03-05 ENCOUNTER — Ambulatory Visit: Payer: Medicare Other | Admitting: Family Medicine

## 2018-03-05 MED ORDER — LINACLOTIDE 145 MCG PO CAPS: 145.0000 ug | ORAL_CAPSULE | Freq: Every day | ORAL | 3 refills | Status: DC

## 2018-03-05 NOTE — Telephone Encounter (Signed)
Pt aware and med sent to pharm 

## 2018-03-07 ENCOUNTER — Telehealth: Payer: Self-pay | Admitting: Family Medicine

## 2018-03-07 NOTE — Telephone Encounter (Signed)
Error

## 2018-03-14 ENCOUNTER — Ambulatory Visit: Payer: Medicare HMO | Admitting: Family Medicine

## 2018-03-19 ENCOUNTER — Ambulatory Visit (INDEPENDENT_AMBULATORY_CARE_PROVIDER_SITE_OTHER): Payer: Medicare Other | Admitting: Family Medicine

## 2018-03-19 ENCOUNTER — Encounter: Payer: Self-pay | Admitting: Family Medicine

## 2018-03-19 VITALS — BP 140/80 | HR 68 | Temp 97.6°F | Resp 16 | Ht 63.0 in | Wt 176.0 lb

## 2018-03-19 DIAGNOSIS — N183 Chronic kidney disease, stage 3 unspecified: Secondary | ICD-10-CM

## 2018-03-19 DIAGNOSIS — I5033 Acute on chronic diastolic (congestive) heart failure: Secondary | ICD-10-CM

## 2018-03-19 NOTE — Progress Notes (Signed)
Subjective:    Patient ID: Laura Best, female    DOB: [birth year], 78 y.o.   MRN: [id number]  HPI  11/22/17 Patient is here today to establish care with me.  Her previous primary care provider has left the practice.  Given her complicated medical history, I wanted to see the patient to determine her overall medical management.  First, the patient has insulin-dependent diabetes mellitus.  She is currently using Levemir 14 units a day.  She takes 7 units of NovoLog with lunch and supper.  Fasting blood sugars range between 89 and 180 with the majority being between 100-140.  Lunchtime sugars are typically 1 80-280 with the majority around 230.  Suppertime sugars are between 206 and 255.  Bedtime sugars are between 140 and 200.  Second, the patient has chronic diastolic heart failure.  She is frequently admitted to the hospital due to pulmonary edema.  Third she has COPD.  She has been treated several times in the past for COPD exacerbations according to her records.  On exam today, she has diminished breath sounds bilaterally with expiratory wheezes and rhonchorous breath sounds in both bases.  She is not on any maintenance therapy.  She reports dyspnea on exertion.  She also reports dyspnea with minimal activity.  At that time, my plan was: First going to work on her blood sugars.  I want to increase her Levemir from 14 units a day to 20 units a day.  Continue the 7 units of NovoLog with lunch and supper.  Recheck blood sugars in 2 weeks.  Second I want to work on her breathing.  She has diminished breath sounds bilaterally with expiratory wheezing and rhonchorous breath sounds.  I believe she would benefit from a daily maintenance therapy.  Therefore I will start the patient on Trelegy 1 inhalation a day.  I have given her samples.  I would like to see her back in 2 weeks to see if her breathing has improved on the maintenance therapy.  I hope that this also will help control or prevent future  exacerbations and reduce her chance of hospitalization.  12/11/17 I am ecstatic!.  Patient's fasting blood sugars are ranging between 67 and 137 however the vast majority are between 100-130.  Her prelunch sugars are between 150 and 180.  Her predinner sugars are between 111 and 193.  Her bedtime sugars are between 130 and 183.  She only has 2 values greater than 200.  This is much better than last time.  She also states that her breathing has improved dramatically since starting Trelegy.  She would like to continue this medication.  On examination today she does have faint bibasilar crackles however her expiratory wheezing has improved dramatically and her breath sounds have improved with improved aeration and more air movement.  At that time, my plan was: Make no further changes in insulin.  Continue Levemir 20 units a day and continue NovoLog 7 units before lunch and dinner.  The reported sugars she brings in today should equate to a hemoglobin A1c around 7 which is where I would stop with this patient.  I am very happy that her breathing has improved.  I will continue the patient on Trelegy 1 inhalation a day.  Discontinue Pulmicort.  02/18/18 Patient is here today complaining of one-week history of pain in her left lower quadrant.  She also reports nausea and vomiting.  She is tender to palpation in the left lower quadrant with voluntary guarding.  She denies any rebound.  Bowel sounds are diminished throughout.  She has also been constipated.  She denies any diarrhea.  She denies any melena.  She denies any hematochezia.  She is overdue for lab work to monitor her blood sugar.  At that time, my plan was: I believe the patient has diverticulitis.  I will start the patient on Cipro 500 mg p.o. twice daily for 10 days and Flagyl 500 mg p.o. twice daily for 10 days and then reassess in 1 week.  While the patient is here I will check a CBC, CMP, fasting lipid panel, and hemoglobin A1c to monitor the  management of her diabetes.  Goal hemoglobin A1c is less than 7.5.  Goal LDL cholesterol is less than 100.  Her COPD appears stable.  She is not wheezing today on exam.  She has no rhonchi. There is no evidence of fluid overload regarding her diastolic congestive heart failure.  I will monitor a CMP to monitor her chronic kidney disease.  If abdominal pain worsens, she will need a CT scan.  Add Linzess 145 mcg p.o. daily for constipation  03/19/18 Office Visit on 02/18/2018  Component Date Value Ref Range Status  . Hgb A1c MFr Bld 02/18/2018 7.6* <5.7 % of total Hgb Final   Comment: For someone without known diabetes, a hemoglobin A1c value of 6.5% or greater indicates that they may have  diabetes and this should be confirmed with a follow-up  test. . For someone with known diabetes, a value <7% indicates  that their diabetes is well controlled and a value  greater than or equal to 7% indicates suboptimal  control. A1c targets should be individualized based on  duration of diabetes, age, comorbid conditions, and  other considerations. . Currently, no consensus exists regarding use of hemoglobin A1c for diagnosis of diabetes for children. .   . Mean Plasma Glucose 02/18/2018 171  (calc) Final  . eAG (mmol/L) 02/18/2018 9.5  (calc) Final  . WBC 02/18/2018 8.3  3.8 - 10.8 Thousand/uL Final  . RBC 02/18/2018 4.19  3.80 - 5.10 Million/uL Final  . Hemoglobin 02/18/2018 11.9  11.7 - 15.5 g/dL Final  . HCT 02/18/2018 35.8  35.0 - 45.0 % Final  . MCV 02/18/2018 85.4  80.0 - 100.0 fL Final  . MCH 02/18/2018 28.4  27.0 - 33.0 pg Final  . MCHC 02/18/2018 33.2  32.0 - 36.0 g/dL Final  . RDW 02/18/2018 14.5  11.0 - 15.0 % Final  . Platelets 02/18/2018 231  140 - 400 Thousand/uL Final  . MPV 02/18/2018 11.6  7.5 - 12.5 fL Final  . Neutro Abs 02/18/2018 5,918  1,500 - 7,800 cells/uL Final  . Lymphs Abs 02/18/2018 1,336  850 - 3,900 cells/uL Final  . Absolute Monocytes 02/18/2018 830  200 - 950  cells/uL Final  . Eosinophils Absolute 02/18/2018 158  15 - 500 cells/uL Final  . Basophils Absolute 02/18/2018 58  0 - 200 cells/uL Final  . Neutrophils Relative % 02/18/2018 71.3  % Final  . Total Lymphocyte 02/18/2018 16.1  % Final  . Monocytes Relative 02/18/2018 10.0  % Final  . Eosinophils Relative 02/18/2018 1.9  % Final  . Basophils Relative 02/18/2018 0.7  % Final  . Glucose, Bld 02/18/2018 68  65 - 99 mg/dL Final   Comment: .            Fasting reference interval .   . BUN 02/18/2018 35* 7 - 25 mg/dL Final  .  Creat 02/18/2018 1.71* 0.60 - 0.93 mg/dL Final   Comment: For patients >50 years of age, the reference limit for Creatinine is approximately 13% higher for people identified as African-American. .   . GFR, Est Non African American 02/18/2018 28* > OR = 60 mL/min/1.79m Final  . GFR, Est African American 02/18/2018 33* > OR = 60 mL/min/1.757mFinal  . BUN/Creatinine Ratio 02/18/2018 20  6 - 22 (calc) Final  . Sodium 02/18/2018 143  135 - 146 mmol/L Final  . Potassium 02/18/2018 4.1  3.5 - 5.3 mmol/L Final  . Chloride 02/18/2018 103  98 - 110 mmol/L Final  . CO2 02/18/2018 31  20 - 32 mmol/L Final  . Calcium 02/18/2018 9.4  8.6 - 10.4 mg/dL Final  . Total Protein 02/18/2018 6.7  6.1 - 8.1 g/dL Final  . Albumin 02/18/2018 4.3  3.6 - 5.1 g/dL Final  . Globulin 02/18/2018 2.4  1.9 - 3.7 g/dL (calc) Final  . AG Ratio 02/18/2018 1.8  1.0 - 2.5 (calc) Final  . Total Bilirubin 02/18/2018 0.4  0.2 - 1.2 mg/dL Final  . Alkaline phosphatase (APISO) 02/18/2018 77  33 - 130 U/L Final  . AST 02/18/2018 14  10 - 35 U/L Final  . ALT 02/18/2018 12  6 - 29 U/L Final  . Cholesterol 02/18/2018 141  <200 mg/dL Final  . HDL 02/18/2018 53  >50 mg/dL Final  . Triglycerides 02/18/2018 168* <150 mg/dL Final  . LDL Cholesterol (Calc) 02/18/2018 63  mg/dL (calc) Final   Comment: Reference range: <100 . Desirable range <100 mg/dL for primary prevention;   <70 mg/dL for patients with CHD  or diabetic patients  with > or = 2 CHD risk factors. . Marland KitchenDL-C is now calculated using the Martin-Hopkins  calculation, which is a validated novel method providing  better accuracy than the Friedewald equation in the  estimation of LDL-C.  MaCresenciano Genret al. JAAnnamaria [id number]  (http://education.[facility].com/faq/FAQ164)   . Total CHOL/HDL Ratio 02/18/2018 2.7  <5.0 (calc) Final  . Non-HDL Cholesterol (Calc) 02/18/2018 88  <130 mg/dL (calc) Final   Comment: For patients with diabetes plus 1 major ASCVD risk  factor, treating to a non-HDL-C goal of <100 mg/dL  (LDL-C of <70 mg/dL) is considered a therapeutic  option.    Since I last saw the patient, her left lower quadrant abdominal pain has improved.  She denies any pain in her lower abdomen today.  She has been using the Linzess and she states that on the [place] has to go the bathroom 3 or 4 times every day.  In fact occasionally she is getting diarrhea.  She has gained 5 pounds since I last saw her.  She has pitting edema in both legs to the knees and also bibasilar crackles.  Patient has not been taking Lasix 60 mg every day.  Instead she has been alternating 60 mg 1 day with 40 mg the next day.  She denies any chest pain or shortness of breath.  She denies any dyspnea however she does appear to be slightly fluid overloaded.  Her diabetes test last time was 7.6 which given her frailty and medical comorbidities I feel is adequate.  Her cholesterol is outstanding.  She has chronic kidney disease that is stable with a creatinine around 1.7. Past Medical History:  Diagnosis Date  . Arthritis    "hands, feet" (02/17/2016)  . CAD (coronary artery disease)    a. 05/2016: cath showing nonobstructive CAD with 50%  prox-Cx stenosis  . CKD (chronic kidney disease) stage 3, GFR 30-59 ml/min (HCC) 12/25/2012  . COPD (chronic obstructive pulmonary disease) ([place])   . Diastolic heart failure ([place])   . Hyperlipidemia   . Hypertension    . Osteoporosis   . Pneumonia    "several times" (02/17/2016)  . Small bowel obstruction ([place]) 02/17/2016  . Tricuspid regurgitation    Echo 05/02/06-Nml LV. Mild MR. Mod TR  . Type II diabetes mellitus ([place])   . Vitamin D deficiency    Past Surgical History:  Procedure Laterality Date  . APPENDECTOMY    . HEART CHAMBER REVISION     patched hole --[place]  . LAPAROSCOPIC APPENDECTOMY N/A 09/27/2013   Procedure: APPENDECTOMY - CONVERTED FROM LAPAROSCOPIC;  Surgeon: Donnie Mesa, MD;  Location: [facility];  Service: General;  Laterality: N/A;  . RIGHT/LEFT HEART CATH AND CORONARY ANGIOGRAPHY N/A 05/24/2016   Procedure: Right/Left Heart Cath and Coronary Angiography;  Surgeon: Larey Dresser, MD;  Location: [facility];  Service: Cardiovascular;  Laterality: N/A;  . TUBAL LIGATION     Current Outpatient Medications on File Prior to Visit  Medication Sig Dispense Refill  . ACCU-CHEK SOFTCLIX LANCETS lancets Check blood sugar 3 times daily before meals and at bedtime. DX:E11.9 200 each 5  . acetaminophen (TYLENOL) 500 MG tablet Take 500 mg by mouth every 6 (six) hours as needed for pain.    Marland Kitchen amLODipine (NORVASC) 10 MG tablet Take 0.5 tablets (5 mg total) by mouth daily. 45 tablet 3  . BD PEN NEEDLE MICRO U/F 32G X 6 MM MISC USE AS DIRECTED THREE TIMES DAILY 300 each 2  . Blood Glucose Monitoring Suppl (ACCU-CHEK AVIVA PLUS) w/Device KIT Check blood sugar 3 times daily before meals and at bedtime. DX:E11.9 1 kit 1  . Blood Glucose Monitoring Suppl KIT Use to check blood sugar  Three times daily before meals and at bedtime.ICD10: E11.8.Please dispense based on insurance preference 1 each 0  . calcitRIOL (ROCALTROL) 0.25 MCG capsule Take 1 capsule (0.25 mcg total) by mouth every Monday, Wednesday, and Friday. 90 capsule 1  . carvedilol (COREG) 12.5 MG tablet Take 1 tablet (12.5 mg total) by mouth 2 (two) times daily. 180 tablet 3  . Cholecalciferol (VITAMIN D) 2000 UNITS CAPS Take 2,000  Units by mouth daily.     . ciprofloxacin (CIPRO) 500 MG tablet Take 1 tablet (500 mg total) by mouth 2 (two) times daily. 20 tablet 0  . cloNIDine (CATAPRES) 0.3 MG tablet Take 1 tablet (0.3 mg total) by mouth 2 (two) times daily. 180 tablet 3  . docusate sodium (COLACE) 100 MG capsule Take 1 capsule (100 mg total) by mouth 2 (two) times daily. 60 capsule 1  . Fluticasone-Umeclidin-Vilant (TRELEGY ELLIPTA) 100-62.5-25 MCG/INH AEPB Inhale 1 Inhaler into the lungs daily. 1 each 11  . furosemide (LASIX) 40 MG tablet Take 1.5 tablets (60 mg total) by mouth daily. Take 60 mg daily 135 tablet 3  . gabapentin (NEURONTIN) 100 MG capsule TAKE 1 TO 2 CAPSULES BY MOUTH AT BEDTIME FOR PERIPHERAL NEUROPATHY. 180 capsule 3  . glucose blood (ACCU-CHEK AVIVA PLUS) test strip Check blood sugar 3 times daily before meals and at bedtime. DX:E11.9 200 each 5  . glucose blood test strip Use as directed. Check blood sugar 3 times daily before meals and at bedtime. 300 each 3  . insulin aspart (NOVOLOG FLEXPEN) 100 UNIT/ML FlexPen INJECT 7 UNITS SUBCUTANEOUSLY WITH LUNCH AND SUPPER. INJECT  15 MINUTES PRIOR TO MEAL OR IMMEDIATELY AFTER MEAL. (Patient taking differently: Inject 7 Units into the skin 2 (two) times daily. INJECT 7 UNITS SUBCUTANEOUSLY WITH LUNCH AND SUPPER. INJECT 15 MINUTES PRIOR TO MEAL OR IMMEDIATELY AFTER MEAL.) 15 pen 11  . Insulin Detemir (LEVEMIR FLEXTOUCH) 100 UNIT/ML Pen Inject 20 Units into the skin at bedtime. SUBCUTANEOUSLY ONCE DAILY AT  10  PM 15 pen 3  . ipratropium-albuterol (DUONEB) 0.5-2.5 (3) MG/3ML SOLN Take 3 mLs by nebulization every 6 (six) hours as needed. 360 mL 5  . Lancets (ACCU-CHEK SOFT TOUCH) lancets Use to check blood sugar ICD10:e11.8 Dispense based on insurance preference 100 each 12  . Lancets Misc. (ACCU-CHEK SOFTCLIX LANCET DEV) KIT Check blood sugar 3 times daily before meals and at bedtime. DX:E11.9 1 kit 1  . linaclotide (LINZESS) 145 MCG CAPS capsule Take 1 capsule (145  mcg total) by mouth daily before breakfast. 30 capsule 3  . metroNIDAZOLE (FLAGYL) 500 MG tablet Take 1 tablet (500 mg total) by mouth 2 (two) times daily. 20 tablet 0  . omeprazole (PRILOSEC) 20 MG capsule Take 1 capsule (20 mg total) by mouth daily. 90 capsule 3  . polyethylene glycol (MIRALAX) packet Take 17 g by mouth daily. Hold for diarrhea. 28 each 1  . PREDNISONE PO Take 40 mg by mouth as needed.    . psyllium (HYDROCIL/METAMUCIL) 95 % PACK Take 1 packet by mouth 3 (three) times daily. 56 each 0  . RELION SHORT PEN NEEDLES 31G X 8 MM MISC USE ONE PEN NEEDLE ONCE DAILY AS DIRECTED 50 each 11  . Rivaroxaban (XARELTO) 15 MG TABS tablet Take 1 tablet (15 mg total) by mouth daily. 90 tablet 3  . simvastatin (ZOCOR) 20 MG tablet TAKE ONE TABLET (20MG) BY MOUTH ONCE DAILY AT BEDTIME 90 tablet 3   No current facility-administered medications on file prior to visit.    Allergies  Allergen Reactions  . Ace Inhibitors Other (See Comments)    Hyperkalemia  . Actos [Pioglitazone] Swelling  . Aspirin Other (See Comments)    G.I. Upset. In high doses  . Metformin And Related Diarrhea  . Eliquis [Apixaban] Rash and Other (See Comments)    Patient states that this med was d/ced by prescriber due to interference with allergies and states that she also had a breakout   Social History   Socioeconomic History  . Marital status: Widowed    Spouse name: Not on file  . Number of children: Not on file  . Years of education: Not on file  . Highest education level: Not on file  Occupational History  . Occupation: retired  Scientific laboratory technician  . Financial resource strain: Not on file  . Food insecurity:    Worry: Not on file    Inability: Not on file  . Transportation needs:    Medical: Not on file    Non-medical: Not on file  Tobacco Use  . Smoking status: Former Smoker    Packs/day: 0.50    Years: 50.00    Pack years: 25.00    Types: Cigarettes    Last attempt to quit: 01/19/2009    Years  since quitting: 9.1  . Smokeless tobacco: Never Used  Substance and Sexual Activity  . Alcohol use: No    Alcohol/week: 0.0 standard drinks  . Drug use: No  . Sexual activity: Never  Lifestyle  . Physical activity:    Days per week: Not on file    Minutes per  session: Not on file  . Stress: Not on file  Relationships  . Social connections:    Talks on phone: Not on file    Gets together: Not on file    Attends religious service: Not on file    Active member of club or organization: Not on file    Attends meetings of clubs or organizations: Not on file    Relationship status: Not on file  . Intimate partner violence:    Fear of current or ex partner: Not on file    Emotionally abused: Not on file    Physically abused: Not on file    Forced sexual activity: Not on file  Other Topics Concern  . Not on file  Social History Narrative   Entered 10/2013:   She has never driven.   She lives with her son.      Review of Systems  All other systems reviewed and are negative.      Objective:   Physical Exam Vitals signs reviewed.  Constitutional:      General: She is not in acute distress.    Appearance: She is well-developed. She is not diaphoretic.  HENT:     Head: Normocephalic and atraumatic.  Neck:     Musculoskeletal: Neck supple.     Vascular: No JVD.  Cardiovascular:     Rate and Rhythm: Normal rate. Rhythm irregularly irregular.  Pulmonary:     Effort: Pulmonary effort is normal. No respiratory distress.     Breath sounds: Rales present. No wheezing or rhonchi.  Abdominal:     General: Bowel sounds are normal. There is no distension.     Palpations: Abdomen is soft. There is no mass.     Tenderness: There is no abdominal tenderness. There is no right CVA tenderness, left CVA tenderness, guarding or rebound.     Hernia: No hernia is present.  Musculoskeletal:     Right lower leg: Edema present.     Left lower leg: Edema present.  Lymphadenopathy:      Cervical: No cervical adenopathy.           Assessment & Plan:  Diastolic CHF, acute on chronic (HCC)  CKD (chronic kidney disease) stage 3, GFR 30-59 ml/min (HCC)  Patient appears slightly fluid overloaded on exam.  I will asked her to take Lasix 60 mg every day rather than alternate 60 with 40.  Her chronic kidney disease is stable.  I would reassess the patient in 1 month to watch her closely and monitor her kidney function on the new dose of Lasix.  Patient is requesting that we discontinue oxygen.  She states that she has not used the oxygen in over 2 months.  She hates to have the oxygen and pay for it when she is not using it.  I believe this is reasonable.  I have asked her to discontinue the Linzess.  Although Acacian is helping with her chronic constipation I am concerned it may lead to dehydration.  Reassess the patient in 1 month.

## 2018-03-25 DIAGNOSIS — J449 Chronic obstructive pulmonary disease, unspecified: Secondary | ICD-10-CM | POA: Diagnosis not present

## 2018-03-25 DIAGNOSIS — I5033 Acute on chronic diastolic (congestive) heart failure: Secondary | ICD-10-CM | POA: Diagnosis not present

## 2018-03-25 DIAGNOSIS — I509 Heart failure, unspecified: Secondary | ICD-10-CM | POA: Diagnosis not present

## 2018-03-25 DIAGNOSIS — J441 Chronic obstructive pulmonary disease with (acute) exacerbation: Secondary | ICD-10-CM | POA: Diagnosis not present

## 2018-03-28 ENCOUNTER — Ambulatory Visit (INDEPENDENT_AMBULATORY_CARE_PROVIDER_SITE_OTHER): Payer: Medicare Other | Admitting: Family Medicine

## 2018-03-28 ENCOUNTER — Encounter: Payer: Self-pay | Admitting: Family Medicine

## 2018-03-28 VITALS — BP 140/70 | HR 78 | Temp 97.8°F | Resp 18 | Ht 63.0 in | Wt 178.0 lb

## 2018-03-28 DIAGNOSIS — R109 Unspecified abdominal pain: Secondary | ICD-10-CM

## 2018-03-28 DIAGNOSIS — R0602 Shortness of breath: Secondary | ICD-10-CM

## 2018-03-28 MED ORDER — METRONIDAZOLE 500 MG PO TABS: 500.0000 mg | ORAL_TABLET | Freq: Two times a day (BID) | ORAL | 0 refills | Status: DC

## 2018-03-28 MED ORDER — CIPROFLOXACIN HCL 250 MG PO TABS: 250.0000 mg | ORAL_TABLET | Freq: Two times a day (BID) | ORAL | 0 refills | Status: DC

## 2018-03-28 NOTE — Progress Notes (Signed)
Subjective:    Patient ID: Laura Best, female    DOB: [birth year], 78 y.o.   MRN: [id number]  HPI  11/22/17 Patient is here today to establish care with me.  Her previous primary care provider has left the practice.  Given her complicated medical history, I wanted to see the patient to determine her overall medical management.  First, the patient has insulin-dependent diabetes mellitus.  She is currently using Levemir 14 units a day.  She takes 7 units of NovoLog with lunch and supper.  Fasting blood sugars range between 89 and 180 with the majority being between 100-140.  Lunchtime sugars are typically 1 80-280 with the majority around 230.  Suppertime sugars are between 206 and 255.  Bedtime sugars are between 140 and 200.  Second, the patient has chronic diastolic heart failure.  She is frequently admitted to the hospital due to pulmonary edema.  Third she has COPD.  She has been treated several times in the past for COPD exacerbations according to her records.  On exam today, she has diminished breath sounds bilaterally with expiratory wheezes and rhonchorous breath sounds in both bases.  She is not on any maintenance therapy.  She reports dyspnea on exertion.  She also reports dyspnea with minimal activity.  At that time, my plan was: First going to work on her blood sugars.  I want to increase her Levemir from 14 units a day to 20 units a day.  Continue the 7 units of NovoLog with lunch and supper.  Recheck blood sugars in 2 weeks.  Second I want to work on her breathing.  She has diminished breath sounds bilaterally with expiratory wheezing and rhonchorous breath sounds.  I believe she would benefit from a daily maintenance therapy.  Therefore I will start the patient on Trelegy 1 inhalation a day.  I have given her samples.  I would like to see her back in 2 weeks to see if her breathing has improved on the maintenance therapy.  I hope that this also will help control or prevent future  exacerbations and reduce her chance of hospitalization.  12/11/17 I am ecstatic!.  Patient's fasting blood sugars are ranging between 67 and 137 however the vast majority are between 100-130.  Her prelunch sugars are between 150 and 180.  Her predinner sugars are between 111 and 193.  Her bedtime sugars are between 130 and 183.  She only has 2 values greater than 200.  This is much better than last time.  She also states that her breathing has improved dramatically since starting Trelegy.  She would like to continue this medication.  On examination today she does have faint bibasilar crackles however her expiratory wheezing has improved dramatically and her breath sounds have improved with improved aeration and more air movement.  At that time, my plan was: Make no further changes in insulin.  Continue Levemir 20 units a day and continue NovoLog 7 units before lunch and dinner.  The reported sugars she brings in today should equate to a hemoglobin A1c around 7 which is where I would stop with this patient.  I am very happy that her breathing has improved.  I will continue the patient on Trelegy 1 inhalation a day.  Discontinue Pulmicort.  02/18/18 Patient is here today complaining of one-week history of pain in her left lower quadrant.  She also reports nausea and vomiting.  She is tender to palpation in the left lower quadrant with voluntary guarding.  She denies any rebound.  Bowel sounds are diminished throughout.  She has also been constipated.  She denies any diarrhea.  She denies any melena.  She denies any hematochezia.  She is overdue for lab work to monitor her blood sugar.  At that time, my plan was: I believe the patient has diverticulitis.  I will start the patient on Cipro 500 mg p.o. twice daily for 10 days and Flagyl 500 mg p.o. twice daily for 10 days and then reassess in 1 week.  While the patient is here I will check a CBC, CMP, fasting lipid panel, and hemoglobin A1c to monitor the  management of her diabetes.  Goal hemoglobin A1c is less than 7.5.  Goal LDL cholesterol is less than 100.  Her COPD appears stable.  She is not wheezing today on exam.  She has no rhonchi. There is no evidence of fluid overload regarding her diastolic congestive heart failure.  I will monitor a CMP to monitor her chronic kidney disease.  If abdominal pain worsens, she will need a CT scan.  Add Linzess 145 mcg p.o. daily for constipation  03/19/18  Since I last saw the patient, her left lower quadrant abdominal pain has improved.  She denies any pain in her lower abdomen today.  She has been using the Linzess and she states that on the [place] she has to go the bathroom 3 or 4 times every day.  In fact occasionally she is getting diarrhea.  She has gained 5 pounds since I last saw her.  She has pitting edema in both legs to the knees and also bibasilar crackles.  Patient has not been taking Lasix 60 mg every day.  Instead she has been alternating 60 mg 1 day with 40 mg the next day.  She denies any chest pain or shortness of breath.  She denies any dyspnea however she does appear to be slightly fluid overloaded.  Her diabetes test last time was 7.6 which given her frailty and medical comorbidities I feel is adequate.  Her cholesterol is outstanding.  She has chronic kidney disease that is stable with a creatinine around 1.7.  At that time, my plan was: Patient appears slightly fluid overloaded on exam.  I will asked her to take Lasix 60 mg every day rather than alternate 60 with 40.  Her chronic kidney disease is stable.  I would reassess the patient in 1 month to watch her closely and monitor her kidney function on the new dose of Lasix.  Patient is requesting that we discontinue oxygen.  She states that she has not used the oxygen in over 2 months.  She hates to have the oxygen and pay for it when she is not using it.  I believe this is reasonable.  I have asked her to discontinue the Linzess.  Although it is  helping with her chronic constipation I am concerned it may lead to dehydration.  Reassess the patient in 1 month.  03/28/18 Patient developed sharp left-sided abdominal pain this morning.  Patient states it hurts when she tries to walk to the bathroom.  Is extremely tender to palpation in the left upper quadrant and left lower quadrant.  She also has some tenderness in the right lower quadrant.  Patient states she has not had a normal bowel movement in more than a week.  When she has bowel movement and she is small pieces of stool.  She also reports dyspnea on exertion.  She denies any  melena.  She denies any hematochezia.  She denies any nausea or vomiting.  She denies any fever or chills however she is tender to palpation in the left lower quadrant and left upper quadrant.  Bowel sounds are diminished throughout.  Weight has increased steadily over the last few office visits.  Today she has +1 pitting edema in both legs and does report some dyspnea on exertion. Wt Readings from Last 3 Encounters:  03/28/18 178 lb (80.7 kg)  03/19/18 176 lb (79.8 kg)  02/18/18 171 lb (77.6 kg)    Past Medical History:  Diagnosis Date  . Arthritis    "hands, feet" (02/17/2016)  . CAD (coronary artery disease)    a. 05/2016: cath showing nonobstructive CAD with 50% prox-Cx stenosis  . CKD (chronic kidney disease) stage 3, GFR 30-59 ml/min ([facility]) 12/25/2012  . COPD (chronic obstructive pulmonary disease) ([place])   . Diastolic heart failure ([place])   . Hyperlipidemia   . Hypertension   . Osteoporosis   . Pneumonia    "several times" (02/17/2016)  . Small bowel obstruction ([place]) 02/17/2016  . Tricuspid regurgitation    Echo 05/02/06-Nml LV. Mild MR. Mod TR  . Type II diabetes mellitus ([place])   . Vitamin D deficiency    Past Surgical History:  Procedure Laterality Date  . APPENDECTOMY    . HEART CHAMBER REVISION     patched hole --[place]  . LAPAROSCOPIC APPENDECTOMY N/A 09/27/2013   Procedure: APPENDECTOMY -  CONVERTED FROM LAPAROSCOPIC;  Surgeon: Donnie Mesa, MD;  Location: [facility];  Service: General;  Laterality: N/A;  . RIGHT/LEFT HEART CATH AND CORONARY ANGIOGRAPHY N/A 05/24/2016   Procedure: Right/Left Heart Cath and Coronary Angiography;  Surgeon: Larey Dresser, MD;  Location: [facility];  Service: Cardiovascular;  Laterality: N/A;  . TUBAL LIGATION     Current Outpatient Medications on File Prior to Visit  Medication Sig Dispense Refill  . ACCU-CHEK SOFTCLIX LANCETS lancets Check blood sugar 3 times daily before meals and at bedtime. DX:E11.9 200 each 5  . acetaminophen (TYLENOL) 500 MG tablet Take 500 mg by mouth every 6 (six) hours as needed for pain.    Marland Kitchen amLODipine (NORVASC) 10 MG tablet Take 0.5 tablets (5 mg total) by mouth daily. 45 tablet 3  . BD PEN NEEDLE MICRO U/F 32G X 6 MM MISC USE AS DIRECTED THREE TIMES DAILY 300 each 2  . Blood Glucose Monitoring Suppl (ACCU-CHEK AVIVA PLUS) w/Device KIT Check blood sugar 3 times daily before meals and at bedtime. DX:E11.9 1 kit 1  . Blood Glucose Monitoring Suppl KIT Use to check blood sugar  Three times daily before meals and at bedtime.ICD10: E11.8.Please dispense based on insurance preference 1 each 0  . calcitRIOL (ROCALTROL) 0.25 MCG capsule Take 1 capsule (0.25 mcg total) by mouth every Monday, Wednesday, and Friday. 90 capsule 1  . carvedilol (COREG) 12.5 MG tablet Take 1 tablet (12.5 mg total) by mouth 2 (two) times daily. 180 tablet 3  . Cholecalciferol (VITAMIN D) 2000 UNITS CAPS Take 2,000 Units by mouth daily.     . ciprofloxacin (CIPRO) 500 MG tablet Take 1 tablet (500 mg total) by mouth 2 (two) times daily. 20 tablet 0  . cloNIDine (CATAPRES) 0.3 MG tablet Take 1 tablet (0.3 mg total) by mouth 2 (two) times daily. 180 tablet 3  . docusate sodium (COLACE) 100 MG capsule Take 1 capsule (100 mg total) by mouth 2 (two) times daily. 60 capsule 1  .  febuxostat (ULORIC) 40 MG tablet Take 40 mg by mouth daily.    .  Fluticasone-Umeclidin-Vilant (TRELEGY ELLIPTA) 100-62.5-25 MCG/INH AEPB Inhale 1 Inhaler into the lungs daily. 1 each 11  . furosemide (LASIX) 40 MG tablet Take 1.5 tablets (60 mg total) by mouth daily. Take 60 mg daily 135 tablet 3  . gabapentin (NEURONTIN) 100 MG capsule TAKE 1 TO 2 CAPSULES BY MOUTH AT BEDTIME FOR PERIPHERAL NEUROPATHY. 180 capsule 3  . glucose blood (ACCU-CHEK AVIVA PLUS) test strip Check blood sugar 3 times daily before meals and at bedtime. DX:E11.9 200 each 5  . glucose blood test strip Use as directed. Check blood sugar 3 times daily before meals and at bedtime. 300 each 3  . insulin aspart (NOVOLOG FLEXPEN) 100 UNIT/ML FlexPen INJECT 7 UNITS SUBCUTANEOUSLY WITH LUNCH AND SUPPER. INJECT 15 MINUTES PRIOR TO MEAL OR IMMEDIATELY AFTER MEAL. (Patient taking differently: Inject 7 Units into the skin 2 (two) times daily. INJECT 7 UNITS SUBCUTANEOUSLY WITH LUNCH AND SUPPER. INJECT 15 MINUTES PRIOR TO MEAL OR IMMEDIATELY AFTER MEAL.) 15 pen 11  . Insulin Detemir (LEVEMIR FLEXTOUCH) 100 UNIT/ML Pen Inject 20 Units into the skin at bedtime. SUBCUTANEOUSLY ONCE DAILY AT  10  PM 15 pen 3  . ipratropium-albuterol (DUONEB) 0.5-2.5 (3) MG/3ML SOLN Take 3 mLs by nebulization every 6 (six) hours as needed. 360 mL 5  . Lancets (ACCU-CHEK SOFT TOUCH) lancets Use to check blood sugar ICD10:e11.8 Dispense based on insurance preference 100 each 12  . Lancets Misc. (ACCU-CHEK SOFTCLIX LANCET DEV) KIT Check blood sugar 3 times daily before meals and at bedtime. DX:E11.9 1 kit 1  . linaclotide (LINZESS) 145 MCG CAPS capsule Take 1 capsule (145 mcg total) by mouth daily before breakfast. 30 capsule 3  . omeprazole (PRILOSEC) 20 MG capsule Take 1 capsule (20 mg total) by mouth daily. 90 capsule 3  . polyethylene glycol (MIRALAX) packet Take 17 g by mouth daily. Hold for diarrhea. 28 each 1  . PREDNISONE PO Take 40 mg by mouth as needed (For foot).     . psyllium (HYDROCIL/METAMUCIL) 95 % PACK Take 1  packet by mouth 3 (three) times daily. 56 each 0  . RELION SHORT PEN NEEDLES 31G X 8 MM MISC USE ONE PEN NEEDLE ONCE DAILY AS DIRECTED 50 each 11  . Rivaroxaban (XARELTO) 15 MG TABS tablet Take 1 tablet (15 mg total) by mouth daily. 90 tablet 3  . simvastatin (ZOCOR) 20 MG tablet TAKE ONE TABLET ('20MG'$ ) BY MOUTH ONCE DAILY AT BEDTIME 90 tablet 3   No current facility-administered medications on file prior to visit.    Allergies  Allergen Reactions  . Ace Inhibitors Other (See Comments)    Hyperkalemia  . Actos [Pioglitazone] Swelling  . Aspirin Other (See Comments)    G.I. Upset. In high doses  . Metformin And Related Diarrhea  . Eliquis [Apixaban] Rash and Other (See Comments)    Patient states that this med was d/ced by prescriber due to interference with allergies and states that she also had a breakout   Social History   Socioeconomic History  . Marital status: Widowed    Spouse name: Not on file  . Number of children: Not on file  . Years of education: Not on file  . Highest education level: Not on file  Occupational History  . Occupation: retired  Scientific laboratory technician  . Financial resource strain: Not on file  . Food insecurity:    Worry: Not on file  Inability: Not on file  . Transportation needs:    Medical: Not on file    Non-medical: Not on file  Tobacco Use  . Smoking status: Former Smoker    Packs/day: 0.50    Years: 50.00    Pack years: 25.00    Types: Cigarettes    Last attempt to quit: 01/19/2009    Years since quitting: 9.1  . Smokeless tobacco: Never Used  Substance and Sexual Activity  . Alcohol use: No    Alcohol/week: 0.0 standard drinks  . Drug use: No  . Sexual activity: Never  Lifestyle  . Physical activity:    Days per week: Not on file    Minutes per session: Not on file  . Stress: Not on file  Relationships  . Social connections:    Talks on phone: Not on file    Gets together: Not on file    Attends religious service: Not on file     Active member of club or organization: Not on file    Attends meetings of clubs or organizations: Not on file    Relationship status: Not on file  . Intimate partner violence:    Fear of current or ex partner: Not on file    Emotionally abused: Not on file    Physically abused: Not on file    Forced sexual activity: Not on file  Other Topics Concern  . Not on file  Social History Narrative   Entered 10/2013:   She has never driven.   She lives with her son.      Review of Systems  All other systems reviewed and are negative.      Objective:   Physical Exam Vitals signs reviewed.  Constitutional:      General: She is not in acute distress.    Appearance: She is well-developed. She is not diaphoretic.  HENT:     Head: Normocephalic and atraumatic.  Neck:     Musculoskeletal: Neck supple.     Vascular: No JVD.  Cardiovascular:     Rate and Rhythm: Normal rate. Rhythm irregularly irregular.  Pulmonary:     Effort: Pulmonary effort is normal. No respiratory distress.     Breath sounds: No wheezing, rhonchi or rales.  Abdominal:     General: Bowel sounds are normal. There is no distension.     Palpations: Abdomen is soft. There is no mass.     Tenderness: There is abdominal tenderness in the left upper quadrant and left lower quadrant. There is no right CVA tenderness, left CVA tenderness, guarding or rebound.     Hernia: No hernia is present.    Musculoskeletal:     Right lower leg: Edema present.     Left lower leg: Edema present.  Lymphadenopathy:     Cervical: No cervical adenopathy.           Assessment & Plan:  SOB (shortness of breath) - Plan: EKG 12-Lead, ciprofloxacin (CIPRO) 250 MG tablet, metroNIDAZOLE (FLAGYL) 500 MG tablet, CBC with Differential/Platelet, COMPLETE METABOLIC PANEL WITH GFR, Brain natriuretic peptide  Left sided abdominal pain - Plan: ciprofloxacin (CIPRO) 250 MG tablet, metroNIDAZOLE (FLAGYL) 500 MG tablet, CBC with  Differential/Platelet, COMPLETE METABOLIC PANEL WITH GFR, Brain natriuretic peptide  EKG shows no evidence of ischemia or infarction.  Abdominal pain is concerning for possible colitis.  Cannot rule out partial bowel obstruction.  We will treat the patient for possible diverticulitis with Cipro 250 mg p.o. twice daily for 10 days and  Flagyl 500 mg p.o. twice daily for 10 days.  We will add Linzess 290 mcg p.o. daily for chronic constipation as she has not had a bowel movement in almost a week that is normal.  Recheck the patient tomorrow.  If abdominal pains worsen she is to go immediately to the emergency room.  If pain is no better by tomorrow, she will need a CT scan of the abdomen and pelvis.  Check CBC to evaluate for leukocytosis as well as CMP to monitor renal function.  I believe the patient is also slightly fluid overloaded causing her shortness of breath.  Patient takes 60 mg of Lasix in the morning.  I have asked her to take an additional 40 mg tonight and I will reassess her breathing and her weight tomorrow morning.  Check a BNP.  Emphasized to the patient that if abdominal pain worsens she needs to go the emergency room.

## 2018-03-29 ENCOUNTER — Ambulatory Visit (HOSPITAL_COMMUNITY)
Admission: RE | Admit: 2018-03-29 | Discharge: 2018-03-29 | Disposition: A | Discharge status: Home / Self Care | Payer: Medicare Other | Source: Ambulatory Visit | Attending: Family Medicine | Admitting: Family Medicine

## 2018-03-29 ENCOUNTER — Ambulatory Visit (INDEPENDENT_AMBULATORY_CARE_PROVIDER_SITE_OTHER): Payer: Medicare Other | Admitting: Family Medicine

## 2018-03-29 ENCOUNTER — Encounter: Payer: Self-pay | Admitting: Family Medicine

## 2018-03-29 VITALS — BP 136/68 | HR 76 | Temp 97.8°F | Resp 18 | Ht 63.0 in | Wt 178.0 lb

## 2018-03-29 DIAGNOSIS — R109 Unspecified abdominal pain: Secondary | ICD-10-CM | POA: Diagnosis not present

## 2018-03-29 DIAGNOSIS — R1032 Left lower quadrant pain: Secondary | ICD-10-CM

## 2018-03-29 LAB — CBC WITH DIFFERENTIAL/PLATELET
Absolute Monocytes: 673 cells/uL (ref 200–950)
BASOS ABS: 91 {cells}/uL (ref 0–200)
Basophils Relative: 1.6 %
Eosinophils Absolute: 171 cells/uL (ref 15–500)
Eosinophils Relative: 3 %
HCT: 34.5 % — ABNORMAL LOW (ref 35.0–45.0)
HEMOGLOBIN: 11.1 g/dL — AB (ref 11.7–15.5)
Lymphs Abs: 1083 cells/uL (ref 850–3900)
MCH: 27.4 pg (ref 27.0–33.0)
MCHC: 32.2 g/dL (ref 32.0–36.0)
MCV: 85.2 fL (ref 80.0–100.0)
MPV: 12 fL (ref 7.5–12.5)
Monocytes Relative: 11.8 %
Neutro Abs: 3682 cells/uL (ref 1500–7800)
Neutrophils Relative %: 64.6 %
Platelets: 182 10*3/uL (ref 140–400)
RBC: 4.05 10*6/uL (ref 3.80–5.10)
RDW: 13.9 % (ref 11.0–15.0)
Total Lymphocyte: 19 %
WBC: 5.7 10*3/uL (ref 3.8–10.8)

## 2018-03-29 LAB — COMPLETE METABOLIC PANEL WITH GFR
AG Ratio: 1.4 (calc) (ref 1.0–2.5)
ALT: 10 U/L (ref 6–29)
AST: 12 U/L (ref 10–35)
Albumin: 3.9 g/dL (ref 3.6–5.1)
Alkaline phosphatase (APISO): 78 U/L (ref 37–153)
BUN/Creatinine Ratio: 18 (calc) (ref 6–22)
BUN: 30 mg/dL — ABNORMAL HIGH (ref 7–25)
CO2: 26 mmol/L (ref 20–32)
Calcium: 9.1 mg/dL (ref 8.6–10.4)
Chloride: 108 mmol/L (ref 98–110)
Creat: 1.66 mg/dL — ABNORMAL HIGH (ref 0.60–0.93)
GFR, EST NON AFRICAN AMERICAN: 29 mL/min/{1.73_m2} — AB (ref >=60)
GFR, Est African American: 34 mL/min/{1.73_m2} — ABNORMAL LOW (ref >=60)
Globulin: 2.7 g/dL (calc) (ref 1.9–3.7)
Glucose, Bld: 192 mg/dL — ABNORMAL HIGH (ref 65–99)
Potassium: 4.4 mmol/L (ref 3.5–5.3)
Sodium: 143 mmol/L (ref 135–146)
TOTAL PROTEIN: 6.6 g/dL (ref 6.1–8.1)
Total Bilirubin: 0.5 mg/dL (ref 0.2–1.2)

## 2018-03-29 LAB — BRAIN NATRIURETIC PEPTIDE: Brain Natriuretic Peptide: 205 pg/mL — ABNORMAL HIGH (ref <100)

## 2018-03-29 NOTE — Progress Notes (Signed)
Subjective:    Patient ID: Laura Best, female    DOB: [birth year], 78 y.o.   MRN: [id number]  HPI  11/22/17 Patient is here today to establish care with me.  Her previous primary care provider has left the practice.  Given her complicated medical history, I wanted to see the patient to determine her overall medical management.  First, the patient has insulin-dependent diabetes mellitus.  She is currently using Levemir 14 units a day.  She takes 7 units of NovoLog with lunch and supper.  Fasting blood sugars range between 89 and 180 with the majority being between 100-140.  Lunchtime sugars are typically 1 80-280 with the majority around 230.  Suppertime sugars are between 206 and 255.  Bedtime sugars are between 140 and 200.  Second, the patient has chronic diastolic heart failure.  She is frequently admitted to the hospital due to pulmonary edema.  Third she has COPD.  She has been treated several times in the past for COPD exacerbations according to her records.  On exam today, she has diminished breath sounds bilaterally with expiratory wheezes and rhonchorous breath sounds in both bases.  She is not on any maintenance therapy.  She reports dyspnea on exertion.  She also reports dyspnea with minimal activity.  At that time, my plan was: First going to work on her blood sugars.  I want to increase her Levemir from 14 units a day to 20 units a day.  Continue the 7 units of NovoLog with lunch and supper.  Recheck blood sugars in 2 weeks.  Second I want to work on her breathing.  She has diminished breath sounds bilaterally with expiratory wheezing and rhonchorous breath sounds.  I believe she would benefit from a daily maintenance therapy.  Therefore I will start the patient on Trelegy 1 inhalation a day.  I have given her samples.  I would like to see her back in 2 weeks to see if her breathing has improved on the maintenance therapy.  I hope that this also will help control or prevent future  exacerbations and reduce her chance of hospitalization.  12/11/17 I am ecstatic!.  Patient's fasting blood sugars are ranging between 67 and 137 however the vast majority are between 100-130.  Her prelunch sugars are between 150 and 180.  Her predinner sugars are between 111 and 193.  Her bedtime sugars are between 130 and 183.  She only has 2 values greater than 200.  This is much better than last time.  She also states that her breathing has improved dramatically since starting Trelegy.  She would like to continue this medication.  On examination today she does have faint bibasilar crackles however her expiratory wheezing has improved dramatically and her breath sounds have improved with improved aeration and more air movement.  At that time, my plan was: Make no further changes in insulin.  Continue Levemir 20 units a day and continue NovoLog 7 units before lunch and dinner.  The reported sugars she brings in today should equate to a hemoglobin A1c around 7 which is where I would stop with this patient.  I am very happy that her breathing has improved.  I will continue the patient on Trelegy 1 inhalation a day.  Discontinue Pulmicort.  02/18/18 Patient is here today complaining of one-week history of pain in her left lower quadrant.  She also reports nausea and vomiting.  She is tender to palpation in the left lower quadrant with voluntary guarding.  She denies any rebound.  Bowel sounds are diminished throughout.  She has also been constipated.  She denies any diarrhea.  She denies any melena.  She denies any hematochezia.  She is overdue for lab work to monitor her blood sugar.  At that time, my plan was: I believe the patient has diverticulitis.  I will start the patient on Cipro 500 mg p.o. twice daily for 10 days and Flagyl 500 mg p.o. twice daily for 10 days and then reassess in 1 week.  While the patient is here I will check a CBC, CMP, fasting lipid panel, and hemoglobin A1c to monitor the  management of her diabetes.  Goal hemoglobin A1c is less than 7.5.  Goal LDL cholesterol is less than 100.  Her COPD appears stable.  She is not wheezing today on exam.  She has no rhonchi. There is no evidence of fluid overload regarding her diastolic congestive heart failure.  I will monitor a CMP to monitor her chronic kidney disease.  If abdominal pain worsens, she will need a CT scan.  Add Linzess 145 mcg p.o. daily for constipation  03/19/18  Since I last saw the patient, her left lower quadrant abdominal pain has improved.  She denies any pain in her lower abdomen today.  She has been using the Linzess and she states that on the [place] she has to go the bathroom 3 or 4 times every day.  In fact occasionally she is getting diarrhea.  She has gained 5 pounds since I last saw her.  She has pitting edema in both legs to the knees and also bibasilar crackles.  Patient has not been taking Lasix 60 mg every day.  Instead she has been alternating 60 mg 1 day with 40 mg the next day.  She denies any chest pain or shortness of breath.  She denies any dyspnea however she does appear to be slightly fluid overloaded.  Her diabetes test last time was 7.6 which given her frailty and medical comorbidities I feel is adequate.  Her cholesterol is outstanding.  She has chronic kidney disease that is stable with a creatinine around 1.7.  At that time, my plan was: Patient appears slightly fluid overloaded on exam.  I will asked her to take Lasix 60 mg every day rather than alternate 60 with 40.  Her chronic kidney disease is stable.  I would reassess the patient in 1 month to watch her closely and monitor her kidney function on the new dose of Lasix.  Patient is requesting that we discontinue oxygen.  She states that she has not used the oxygen in over 2 months.  She hates to have the oxygen and pay for it when she is not using it.  I believe this is reasonable.  I have asked her to discontinue the Linzess.  Although it is  helping with her chronic constipation I am concerned it may lead to dehydration.  Reassess the patient in 1 month.  03/28/18 Patient developed sharp left-sided abdominal pain this morning.  Patient states it hurts when she tries to walk to the bathroom.  Is extremely tender to palpation in the left upper quadrant and left lower quadrant.  She also has some tenderness in the right lower quadrant.  Patient states she has not had a normal bowel movement in more than a week.  When she has bowel movement and she is small pieces of stool.  She also reports dyspnea on exertion.  She denies any  melena.  She denies any hematochezia.  She denies any nausea or vomiting.  She denies any fever or chills however she is tender to palpation in the left lower quadrant and left upper quadrant.  Bowel sounds are diminished throughout.  Weight has increased steadily over the last few office visits.  Today she has +1 pitting edema in both legs and does report some dyspnea on exertion.  At that time, my plan was: EKG shows no evidence of ischemia or infarction.  Abdominal pain is concerning for possible colitis.  Cannot rule out partial bowel obstruction.  We will treat the patient for possible diverticulitis with Cipro 250 mg p.o. twice daily for 10 days and Flagyl 500 mg p.o. twice daily for 10 days.  We will add Linzess 290 mcg p.o. daily for chronic constipation as she has not had a bowel movement in almost a week that is normal.  Recheck the patient tomorrow.  If abdominal pains worsen she is to go immediately to the emergency room.  If pain is no better by tomorrow, she will need a CT scan of the abdomen and pelvis.  Check CBC to evaluate for leukocytosis as well as CMP to monitor renal function.  I believe the patient is also slightly fluid overloaded causing her shortness of breath.  Patient takes 60 mg of Lasix in the morning.  I have asked her to take an additional 40 mg tonight and I will reassess her breathing and her  weight tomorrow morning.  Check a BNP.  Emphasized to the patient that if abdominal pain worsens she needs to go the emergency room. Wt Readings from Last 3 Encounters:  03/29/18 178 lb (80.7 kg)  03/28/18 178 lb (80.7 kg)  03/19/18 176 lb (79.8 kg)  03/29/18 Abdominal pain is much better today.  She is no longer tender to palpation of the left lower quadrant.  She is still little sore there however it is much improved compared to yesterday.  She denies any nausea or vomiting.  She denies any fever.  She denies any melena or hematochezia.  She is still not having a good normal bowel movement despite taking Linzess yesterday.  She has not taken it yet today.  She is only had a few small pebble-like bowel movements today  Past Medical History:  Diagnosis Date  . Arthritis    "hands, feet" (02/17/2016)  . CAD (coronary artery disease)    a. 05/2016: cath showing nonobstructive CAD with 50% prox-Cx stenosis  . CKD (chronic kidney disease) stage 3, GFR 30-59 ml/min ([facility]) 12/25/2012  . COPD (chronic obstructive pulmonary disease) ([place])   . Diastolic heart failure ([place])   . Hyperlipidemia   . Hypertension   . Osteoporosis   . Pneumonia    "several times" (02/17/2016)  . Small bowel obstruction ([place]) 02/17/2016  . Tricuspid regurgitation    Echo 05/02/06-Nml LV. Mild MR. Mod TR  . Type II diabetes mellitus ([place])   . Vitamin D deficiency    Past Surgical History:  Procedure Laterality Date  . APPENDECTOMY    . HEART CHAMBER REVISION     patched hole --[facility]  . LAPAROSCOPIC APPENDECTOMY N/A 09/27/2013   Procedure: APPENDECTOMY - CONVERTED FROM LAPAROSCOPIC;  Surgeon: Donnie Mesa, MD;  Location: [facility];  Service: General;  Laterality: N/A;  . RIGHT/LEFT HEART CATH AND CORONARY ANGIOGRAPHY N/A 05/24/2016   Procedure: Right/Left Heart Cath and Coronary Angiography;  Surgeon: Larey Dresser, MD;  Location: [facility];  Service: Cardiovascular;  Laterality: N/A;  . TUBAL LIGATION      Current Outpatient Medications on File Prior to Visit  Medication Sig Dispense Refill  . ACCU-CHEK SOFTCLIX LANCETS lancets Check blood sugar 3 times daily before meals and at bedtime. DX:E11.9 200 each 5  . acetaminophen (TYLENOL) 500 MG tablet Take 500 mg by mouth every 6 (six) hours as needed for pain.    Marland Kitchen amLODipine (NORVASC) 10 MG tablet Take 0.5 tablets (5 mg total) by mouth daily. 45 tablet 3  . BD PEN NEEDLE MICRO U/F 32G X 6 MM MISC USE AS DIRECTED THREE TIMES DAILY 300 each 2  . Blood Glucose Monitoring Suppl (ACCU-CHEK AVIVA PLUS) w/Device KIT Check blood sugar 3 times daily before meals and at bedtime. DX:E11.9 1 kit 1  . Blood Glucose Monitoring Suppl KIT Use to check blood sugar  Three times daily before meals and at bedtime.ICD10: E11.8.Please dispense based on insurance preference 1 each 0  . calcitRIOL (ROCALTROL) 0.25 MCG capsule Take 1 capsule (0.25 mcg total) by mouth every Monday, Wednesday, and Friday. 90 capsule 1  . carvedilol (COREG) 12.5 MG tablet Take 1 tablet (12.5 mg total) by mouth 2 (two) times daily. 180 tablet 3  . Cholecalciferol (VITAMIN D) 2000 UNITS CAPS Take 2,000 Units by mouth daily.     . ciprofloxacin (CIPRO) 250 MG tablet Take 1 tablet (250 mg total) by mouth 2 (two) times daily. 20 tablet 0  . cloNIDine (CATAPRES) 0.3 MG tablet Take 1 tablet (0.3 mg total) by mouth 2 (two) times daily. 180 tablet 3  . docusate sodium (COLACE) 100 MG capsule Take 1 capsule (100 mg total) by mouth 2 (two) times daily. 60 capsule 1  . febuxostat (ULORIC) 40 MG tablet Take 40 mg by mouth daily.    . Fluticasone-Umeclidin-Vilant (TRELEGY ELLIPTA) 100-62.5-25 MCG/INH AEPB Inhale 1 Inhaler into the lungs daily. 1 each 11  . furosemide (LASIX) 40 MG tablet Take 1.5 tablets (60 mg total) by mouth daily. Take 60 mg daily 135 tablet 3  . gabapentin (NEURONTIN) 100 MG capsule TAKE 1 TO 2 CAPSULES BY MOUTH AT BEDTIME FOR PERIPHERAL NEUROPATHY. 180 capsule 3  . glucose blood  (ACCU-CHEK AVIVA PLUS) test strip Check blood sugar 3 times daily before meals and at bedtime. DX:E11.9 200 each 5  . glucose blood test strip Use as directed. Check blood sugar 3 times daily before meals and at bedtime. 300 each 3  . insulin aspart (NOVOLOG FLEXPEN) 100 UNIT/ML FlexPen INJECT 7 UNITS SUBCUTANEOUSLY WITH LUNCH AND SUPPER. INJECT 15 MINUTES PRIOR TO MEAL OR IMMEDIATELY AFTER MEAL. (Patient taking differently: Inject 7 Units into the skin 2 (two) times daily. INJECT 7 UNITS SUBCUTANEOUSLY WITH LUNCH AND SUPPER. INJECT 15 MINUTES PRIOR TO MEAL OR IMMEDIATELY AFTER MEAL.) 15 pen 11  . Insulin Detemir (LEVEMIR FLEXTOUCH) 100 UNIT/ML Pen Inject 20 Units into the skin at bedtime. SUBCUTANEOUSLY ONCE DAILY AT  10  PM 15 pen 3  . ipratropium-albuterol (DUONEB) 0.5-2.5 (3) MG/3ML SOLN Take 3 mLs by nebulization every 6 (six) hours as needed. 360 mL 5  . Lancets (ACCU-CHEK SOFT TOUCH) lancets Use to check blood sugar ICD10:e11.8 Dispense based on insurance preference 100 each 12  . Lancets Misc. (ACCU-CHEK SOFTCLIX LANCET DEV) KIT Check blood sugar 3 times daily before meals and at bedtime. DX:E11.9 1 kit 1  . linaclotide (LINZESS) 145 MCG CAPS capsule Take 1 capsule (145 mcg total) by mouth daily before breakfast. 30 capsule 3  .  metroNIDAZOLE (FLAGYL) 500 MG tablet Take 1 tablet (500 mg total) by mouth 2 (two) times daily. 20 tablet 0  . omeprazole (PRILOSEC) 20 MG capsule Take 1 capsule (20 mg total) by mouth daily. 90 capsule 3  . polyethylene glycol (MIRALAX) packet Take 17 g by mouth daily. Hold for diarrhea. 28 each 1  . PREDNISONE PO Take 40 mg by mouth as needed (For foot).     . psyllium (HYDROCIL/METAMUCIL) 95 % PACK Take 1 packet by mouth 3 (three) times daily. 56 each 0  . RELION SHORT PEN NEEDLES 31G X 8 MM MISC USE ONE PEN NEEDLE ONCE DAILY AS DIRECTED 50 each 11  . Rivaroxaban (XARELTO) 15 MG TABS tablet Take 1 tablet (15 mg total) by mouth daily. 90 tablet 3  . simvastatin  (ZOCOR) 20 MG tablet TAKE ONE TABLET (20MG) BY MOUTH ONCE DAILY AT BEDTIME 90 tablet 3   No current facility-administered medications on file prior to visit.    Allergies  Allergen Reactions  . Ace Inhibitors Other (See Comments)    Hyperkalemia  . Actos [Pioglitazone] Swelling  . Aspirin Other (See Comments)    G.I. Upset. In high doses  . Metformin And Related Diarrhea  . Eliquis [Apixaban] Rash and Other (See Comments)    Patient states that this med was d/ced by prescriber due to interference with allergies and states that she also had a breakout   Social History   Socioeconomic History  . Marital status: Widowed    Spouse name: Not on file  . Number of children: Not on file  . Years of education: Not on file  . Highest education level: Not on file  Occupational History  . Occupation: retired  Scientific laboratory technician  . Financial resource strain: Not on file  . Food insecurity:    Worry: Not on file    Inability: Not on file  . Transportation needs:    Medical: Not on file    Non-medical: Not on file  Tobacco Use  . Smoking status: Former Smoker    Packs/day: 0.50    Years: 50.00    Pack years: 25.00    Types: Cigarettes    Last attempt to quit: 01/19/2009    Years since quitting: 9.1  . Smokeless tobacco: Never Used  Substance and Sexual Activity  . Alcohol use: No    Alcohol/week: 0.0 standard drinks  . Drug use: No  . Sexual activity: Never  Lifestyle  . Physical activity:    Days per week: Not on file    Minutes per session: Not on file  . Stress: Not on file  Relationships  . Social connections:    Talks on phone: Not on file    Gets together: Not on file    Attends religious service: Not on file    Active member of club or organization: Not on file    Attends meetings of clubs or organizations: Not on file    Relationship status: Not on file  . Intimate partner violence:    Fear of current or ex partner: Not on file    Emotionally abused: Not on file     Physically abused: Not on file    Forced sexual activity: Not on file  Other Topics Concern  . Not on file  Social History Narrative   Entered 10/2013:   She has never driven.   She lives with her son.      Review of Systems  All other systems reviewed  and are negative.      Objective:   Physical Exam Vitals signs reviewed.  Constitutional:      General: She is not in acute distress.    Appearance: She is well-developed. She is not diaphoretic.  HENT:     Head: Normocephalic and atraumatic.  Neck:     Musculoskeletal: Neck supple.     Vascular: No JVD.  Cardiovascular:     Rate and Rhythm: Normal rate. Rhythm irregularly irregular.  Pulmonary:     Effort: Pulmonary effort is normal. No respiratory distress.     Breath sounds: No wheezing, rhonchi or rales.  Abdominal:     General: Bowel sounds are normal. There is no distension.     Palpations: Abdomen is soft. There is no mass.     Tenderness: There is no abdominal tenderness. There is no right CVA tenderness, left CVA tenderness, guarding or rebound.     Hernia: No hernia is present.  Musculoskeletal:     Right lower leg: Edema present.     Left lower leg: Edema present.  Lymphadenopathy:     Cervical: No cervical adenopathy.           Assessment & Plan:  Abdominal pain, LLQ - Plan: DG Abd Acute W/Chest Exam today is normal.  She does continue to have some edema however it is clinically improved.  BNP was only slightly elevated and therefore I would not continue increased diuretics for prolonged period of time.  I have instructed the patient to resume her previous dose.  I believe the majority of her abdominal pain could have been from diverticulitis as it has improved on antibiotics however I do believe constipation is playing a role as well.  I encouraged the patient to continue Linzess 290 g daily until she is having regular bowel movements and then switch to 145 mcg daily thereafter to maintain and treat her  chronic constipation.  Obtain x-ray of the abdomen to evaluate further as I suspect it will show a very large stool burden and rule out problems such as ileus or partial bowel obstruction however clinically the patient is much improved and no longer has evidence of any abdominal tenderness

## 2018-04-12 ENCOUNTER — Telehealth: Payer: Self-pay | Admitting: Family Medicine

## 2018-04-12 MED ORDER — FEBUXOSTAT 40 MG PO TABS: 40.0000 mg | ORAL_TABLET | Freq: Every day | ORAL | 3 refills | Status: DC

## 2018-04-12 MED ORDER — GLUCOSE BLOOD VI STRP: ORAL_STRIP | 5 refills | Status: DC

## 2018-04-12 NOTE — Telephone Encounter (Signed)
Pt needs refill on her accuchek freestyle test strips and her febuxostat to [facility].

## 2018-04-12 NOTE — Telephone Encounter (Signed)
Requested sent to pharm.

## 2018-04-15 ENCOUNTER — Telehealth: Payer: Self-pay

## 2018-04-15 NOTE — Telephone Encounter (Signed)
PA was approved for Uloric 40 mg tablets.   Request Reference Number: [id number]. ULORIC TAB 40MG  is approved through 01/23/2019. For further questions, call [phone].

## 2018-04-15 NOTE — Telephone Encounter (Signed)
PA submitted via CMM for Uloirc 40 mg tablets. Waiting on reply.

## 2018-04-25 ENCOUNTER — Ambulatory Visit: Payer: Medicare Other | Admitting: Family Medicine

## 2018-04-25 DIAGNOSIS — I509 Heart failure, unspecified: Secondary | ICD-10-CM | POA: Diagnosis not present

## 2018-04-25 DIAGNOSIS — J441 Chronic obstructive pulmonary disease with (acute) exacerbation: Secondary | ICD-10-CM | POA: Diagnosis not present

## 2018-04-25 DIAGNOSIS — J449 Chronic obstructive pulmonary disease, unspecified: Secondary | ICD-10-CM | POA: Diagnosis not present

## 2018-04-25 DIAGNOSIS — I5033 Acute on chronic diastolic (congestive) heart failure: Secondary | ICD-10-CM | POA: Diagnosis not present

## 2018-04-29 ENCOUNTER — Other Ambulatory Visit: Payer: Self-pay

## 2018-04-29 ENCOUNTER — Ambulatory Visit (INDEPENDENT_AMBULATORY_CARE_PROVIDER_SITE_OTHER): Payer: Medicare Other | Admitting: Family Medicine

## 2018-04-29 DIAGNOSIS — I5033 Acute on chronic diastolic (congestive) heart failure: Secondary | ICD-10-CM

## 2018-04-29 DIAGNOSIS — Z794 Long term (current) use of insulin: Secondary | ICD-10-CM

## 2018-04-29 DIAGNOSIS — R1032 Left lower quadrant pain: Secondary | ICD-10-CM | POA: Diagnosis not present

## 2018-04-29 DIAGNOSIS — E118 Type 2 diabetes mellitus with unspecified complications: Secondary | ICD-10-CM

## 2018-04-29 NOTE — Progress Notes (Signed)
Subjective:    Patient ID: Laura Best, female    DOB: [birth year], 78 y.o.   MRN: [id number]  HPI  11/22/17 Patient is here today to establish care with me.  Her previous primary care provider has left the practice.  Given her complicated medical history, I wanted to see the patient to determine her overall medical management.  First, the patient has insulin-dependent diabetes mellitus.  She is currently using Levemir 14 units a day.  She takes 7 units of NovoLog with lunch and supper.  Fasting blood sugars range between 89 and 180 with the majority being between 100-140.  Lunchtime sugars are typically 1 80-280 with the majority around 230.  Suppertime sugars are between 206 and 255.  Bedtime sugars are between 140 and 200.  Second, the patient has chronic diastolic heart failure.  She is frequently admitted to the hospital due to pulmonary edema.  Third she has COPD.  She has been treated several times in the past for COPD exacerbations according to her records.  On exam today, she has diminished breath sounds bilaterally with expiratory wheezes and rhonchorous breath sounds in both bases.  She is not on any maintenance therapy.  She reports dyspnea on exertion.  She also reports dyspnea with minimal activity.  At that time, my plan was: First going to work on her blood sugars.  I want to increase her Levemir from 14 units a day to 20 units a day.  Continue the 7 units of NovoLog with lunch and supper.  Recheck blood sugars in 2 weeks.  Second I want to work on her breathing.  She has diminished breath sounds bilaterally with expiratory wheezing and rhonchorous breath sounds.  I believe she would benefit from a daily maintenance therapy.  Therefore I will start the patient on Trelegy 1 inhalation a day.  I have given her samples.  I would like to see her back in 2 weeks to see if her breathing has improved on the maintenance therapy.  I hope that this also will help control or prevent future  exacerbations and reduce her chance of hospitalization.  12/11/17 I am ecstatic!.  Patient's fasting blood sugars are ranging between 67 and 137 however the vast majority are between 100-130.  Her prelunch sugars are between 150 and 180.  Her predinner sugars are between 111 and 193.  Her bedtime sugars are between 130 and 183.  She only has 2 values greater than 200.  This is much better than last time.  She also states that her breathing has improved dramatically since starting Trelegy.  She would like to continue this medication.  On examination today she does have faint bibasilar crackles however her expiratory wheezing has improved dramatically and her breath sounds have improved with improved aeration and more air movement.  At that time, my plan was: Make no further changes in insulin.  Continue Levemir 20 units a day and continue NovoLog 7 units before lunch and dinner.  The reported sugars she brings in today should equate to a hemoglobin A1c around 7 which is where I would stop with this patient.  I am very happy that her breathing has improved.  I will continue the patient on Trelegy 1 inhalation a day.  Discontinue Pulmicort.  02/18/18 Patient is here today complaining of one-week history of pain in her left lower quadrant.  She also reports nausea and vomiting.  She is tender to palpation in the left lower quadrant with voluntary guarding.  She denies any rebound.  Bowel sounds are diminished throughout.  She has also been constipated.  She denies any diarrhea.  She denies any melena.  She denies any hematochezia.  She is overdue for lab work to monitor her blood sugar.  At that time, my plan was: I believe the patient has diverticulitis.  I will start the patient on Cipro 500 mg p.o. twice daily for 10 days and Flagyl 500 mg p.o. twice daily for 10 days and then reassess in 1 week.  While the patient is here I will check a CBC, CMP, fasting lipid panel, and hemoglobin A1c to monitor the  management of her diabetes.  Goal hemoglobin A1c is less than 7.5.  Goal LDL cholesterol is less than 100.  Her COPD appears stable.  She is not wheezing today on exam.  She has no rhonchi. There is no evidence of fluid overload regarding her diastolic congestive heart failure.  I will monitor a CMP to monitor her chronic kidney disease.  If abdominal pain worsens, she will need a CT scan.  Add Linzess 145 mcg p.o. daily for constipation  03/19/18  Since I last saw the patient, her left lower quadrant abdominal pain has improved.  She denies any pain in her lower abdomen today.  She has been using the Linzess and she states that on the [place] she has to go the bathroom 3 or 4 times every day.  In fact occasionally she is getting diarrhea.  She has gained 5 pounds since I last saw her.  She has pitting edema in both legs to the knees and also bibasilar crackles.  Patient has not been taking Lasix 60 mg every day.  Instead she has been alternating 60 mg 1 day with 40 mg the next day.  She denies any chest pain or shortness of breath.  She denies any dyspnea however she does appear to be slightly fluid overloaded.  Her diabetes test last time was 7.6 which given her frailty and medical comorbidities I feel is adequate.  Her cholesterol is outstanding.  She has chronic kidney disease that is stable with a creatinine around 1.7.  At that time, my plan was: Patient appears slightly fluid overloaded on exam.  I will asked her to take Lasix 60 mg every day rather than alternate 60 with 40.  Her chronic kidney disease is stable.  I would reassess the patient in 1 month to watch her closely and monitor her kidney function on the new dose of Lasix.  Patient is requesting that we discontinue oxygen.  She states that she has not used the oxygen in over 2 months.  She hates to have the oxygen and pay for it when she is not using it.  I believe this is reasonable.  I have asked her to discontinue the Linzess.  Although it is  helping with her chronic constipation I am concerned it may lead to dehydration.  Reassess the patient in 1 month.  03/28/18 Patient developed sharp left-sided abdominal pain this morning.  Patient states it hurts when she tries to walk to the bathroom.  Is extremely tender to palpation in the left upper quadrant and left lower quadrant.  She also has some tenderness in the right lower quadrant.  Patient states she has not had a normal bowel movement in more than a week.  When she has bowel movement and she is small pieces of stool.  She also reports dyspnea on exertion.  She denies any  melena.  She denies any hematochezia.  She denies any nausea or vomiting.  She denies any fever or chills however she is tender to palpation in the left lower quadrant and left upper quadrant.  Bowel sounds are diminished throughout.  Weight has increased steadily over the last few office visits.  Today she has +1 pitting edema in both legs and does report some dyspnea on exertion.  At that time, my plan was: EKG shows no evidence of ischemia or infarction.  Abdominal pain is concerning for possible colitis.  Cannot rule out partial bowel obstruction.  We will treat the patient for possible diverticulitis with Cipro 250 mg p.o. twice daily for 10 days and Flagyl 500 mg p.o. twice daily for 10 days.  We will add Linzess 290 mcg p.o. daily for chronic constipation as she has not had a bowel movement in almost a week that is normal.  Recheck the patient tomorrow.  If abdominal pains worsen she is to go immediately to the emergency room.  If pain is no better by tomorrow, she will need a CT scan of the abdomen and pelvis.  Check CBC to evaluate for leukocytosis as well as CMP to monitor renal function.  I believe the patient is also slightly fluid overloaded causing her shortness of breath.  Patient takes 60 mg of Lasix in the morning.  I have asked her to take an additional 40 mg tonight and I will reassess her breathing and her  weight tomorrow morning.  Check a BNP.  Emphasized to the patient that if abdominal pain worsens she needs to go the emergency room. Wt Readings from Last 3 Encounters:  03/29/18 178 lb (80.7 kg)  03/28/18 178 lb (80.7 kg)  03/19/18 176 lb (79.8 kg)  03/29/18 Abdominal pain is much better today.  She is no longer tender to palpation of the left lower quadrant.  She is still little sore there however it is much improved compared to yesterday.  She denies any nausea or vomiting.  She denies any fever.  She denies any melena or hematochezia.  She is still not having a good normal bowel movement despite taking Linzess yesterday.  She has not taken it yet today.  She is only had a few small pebble-like bowel movements today.  At that time, my plan was: Exam today is normal.  She does continue to have some edema however it is clinically improved.  BNP was only slightly elevated and therefore I would not continue increased diuretics for prolonged period of time.  I have instructed the patient to resume her previous dose.  I believe the majority of her abdominal pain could have been from diverticulitis as it has improved on antibiotics however I do believe constipation is playing a role as well.  I encouraged the patient to continue Linzess 290 g daily until she is having regular bowel movements and then switch to 145 mcg daily thereafter to maintain and treat her chronic constipation.  Obtain x-ray of the abdomen to evaluate further as I suspect it will show a very large stool burden and rule out problems such as ileus or partial bowel obstruction however clinically the patient is much improved and no longer has evidence of any abdominal tenderness  04/29/18  Patient is being seen today in follow-up of her chronic medical illnesses.  However I called the patient and switched her visit to a telephone visit rather than an office visit due to the recent coronavirus pandemic.  Patient agrees  to be seen over the  telephone rather than in the office.  She is currently at home.  I am currently in the office.  Phone call began at 1040.  Phone call concluded at 1051.  We started our phone conversation by discussing her abdomen.  She denies any abdominal pain since I last saw her.  She is making an effort to take MiraLAX on a daily basis and trying to avoid constipation.  Since that time, she is had no further left lower quadrant abdominal pain.  She denies any fevers or chills.  She denies any nausea or vomiting or weight loss.  We then discussed her glycemic control.  She is currently on 20 units of Levemir daily.  She takes 7 units of quick acting insulin with lunch and supper.  Her fasting sugars are between 80 and 100.  Her 2-hour postprandial sugars are typically in the 100s.  She seldom sees anything above 200.  She denies any hypoglycemic episodes.  Last we discussed her diastolic congestive heart failure.  Recently she states that her legs have been swelling however she has been taking her Lasix 60 mg a day and she states that the swelling has subsided in her legs.  She denies any orthopnea.  She denies any paroxysmal nocturnal dyspnea.  She denies any cough or fevers or chills or shortness of breath.  She denies any chest pain.  She is consistently taking her Trelegy which is also helping to manage her COPD. Past Medical History:  Diagnosis Date   Arthritis    "hands, feet" (02/17/2016)   CAD (coronary artery disease)    a. 05/2016: cath showing nonobstructive CAD with 50% prox-Cx stenosis   CKD (chronic kidney disease) stage 3, GFR 30-59 ml/min (HCC) 12/25/2012   COPD (chronic obstructive pulmonary disease) (HCC)    Diastolic heart failure (HCC)    Hyperlipidemia    Hypertension    Osteoporosis    Pneumonia    "several times" (02/17/2016)   Small bowel obstruction (HCC) 02/17/2016   Tricuspid regurgitation    Echo 05/02/06-Nml LV. Mild MR. Mod TR   Type II diabetes mellitus ([place])    Vitamin D  deficiency    Past Surgical History:  Procedure Laterality Date   APPENDECTOMY     HEART CHAMBER REVISION     patched hole --[place] N/A 09/27/2013   Procedure: APPENDECTOMY - CONVERTED FROM LAPAROSCOPIC;  Surgeon: Donnie Mesa, MD;  Location: [facility];  Service: General;  Laterality: N/A;   RIGHT/LEFT HEART CATH AND CORONARY ANGIOGRAPHY N/A 05/24/2016   Procedure: Right/Left Heart Cath and Coronary Angiography;  Surgeon: Larey Dresser, MD;  Location: [facility];  Service: Cardiovascular;  Laterality: N/A;   TUBAL LIGATION     Current Outpatient Medications on File Prior to Visit  Medication Sig Dispense Refill   ACCU-CHEK SOFTCLIX LANCETS lancets Check blood sugar 3 times daily before meals and at bedtime. DX:E11.9 200 each 5   acetaminophen (TYLENOL) 500 MG tablet Take 500 mg by mouth every 6 (six) hours as needed for pain.     amLODipine (NORVASC) 10 MG tablet Take 0.5 tablets (5 mg total) by mouth daily. 45 tablet 3   BD PEN NEEDLE MICRO U/F 32G X 6 MM MISC USE AS DIRECTED THREE TIMES DAILY 300 each 2   Blood Glucose Monitoring Suppl (ACCU-CHEK AVIVA PLUS) w/Device KIT Check blood sugar 3 times daily before meals and at bedtime. DX:E11.9 1 kit 1  Blood Glucose Monitoring Suppl KIT Use to check blood sugar  Three times daily before meals and at bedtime.ICD10: E11.8.Please dispense based on insurance preference 1 each 0   calcitRIOL (ROCALTROL) 0.25 MCG capsule Take 1 capsule (0.25 mcg total) by mouth every Monday, Wednesday, and Friday. 90 capsule 1   carvedilol (COREG) 12.5 MG tablet Take 1 tablet (12.5 mg total) by mouth 2 (two) times daily. 180 tablet 3   Cholecalciferol (VITAMIN D) 2000 UNITS CAPS Take 2,000 Units by mouth daily.      ciprofloxacin (CIPRO) 250 MG tablet Take 1 tablet (250 mg total) by mouth 2 (two) times daily. 20 tablet 0   cloNIDine (CATAPRES) 0.3 MG tablet Take 1 tablet (0.3 mg total) by mouth 2 (two) times  daily. 180 tablet 3   docusate sodium (COLACE) 100 MG capsule Take 1 capsule (100 mg total) by mouth 2 (two) times daily. 60 capsule 1   febuxostat (ULORIC) 40 MG tablet Take 1 tablet (40 mg total) by mouth daily. 90 tablet 3   Fluticasone-Umeclidin-Vilant (TRELEGY ELLIPTA) 100-62.5-25 MCG/INH AEPB Inhale 1 Inhaler into the lungs daily. 1 each 11   furosemide (LASIX) 40 MG tablet Take 1.5 tablets (60 mg total) by mouth daily. Take 60 mg daily 135 tablet 3   gabapentin (NEURONTIN) 100 MG capsule TAKE 1 TO 2 CAPSULES BY MOUTH AT BEDTIME FOR PERIPHERAL NEUROPATHY. 180 capsule 3   glucose blood (ACCU-CHEK AVIVA PLUS) test strip Check blood sugar 3 times daily before meals and at bedtime. DX:E11.9 200 each 5   glucose blood test strip Use as directed. Check blood sugar 3 times daily before meals and at bedtime. 300 each 3   insulin aspart (NOVOLOG FLEXPEN) 100 UNIT/ML FlexPen INJECT 7 UNITS SUBCUTANEOUSLY WITH LUNCH AND SUPPER. INJECT 15 MINUTES PRIOR TO MEAL OR IMMEDIATELY AFTER MEAL. (Patient taking differently: Inject 7 Units into the skin 2 (two) times daily. INJECT 7 UNITS SUBCUTANEOUSLY WITH LUNCH AND SUPPER. INJECT 15 MINUTES PRIOR TO MEAL OR IMMEDIATELY AFTER MEAL.) 15 pen 11   Insulin Detemir (LEVEMIR FLEXTOUCH) 100 UNIT/ML Pen Inject 20 Units into the skin at bedtime. SUBCUTANEOUSLY ONCE DAILY AT  10  PM 15 pen 3   ipratropium-albuterol (DUONEB) 0.5-2.5 (3) MG/3ML SOLN Take 3 mLs by nebulization every 6 (six) hours as needed. 360 mL 5   Lancets (ACCU-CHEK SOFT TOUCH) lancets Use to check blood sugar ICD10:e11.8 Dispense based on insurance preference 100 each 12   Lancets Misc. (ACCU-CHEK SOFTCLIX LANCET DEV) KIT Check blood sugar 3 times daily before meals and at bedtime. DX:E11.9 1 kit 1   linaclotide (LINZESS) 145 MCG CAPS capsule Take 1 capsule (145 mcg total) by mouth daily before breakfast. 30 capsule 3   metroNIDAZOLE (FLAGYL) 500 MG tablet Take 1 tablet (500 mg total) by mouth  2 (two) times daily. 20 tablet 0   omeprazole (PRILOSEC) 20 MG capsule Take 1 capsule (20 mg total) by mouth daily. 90 capsule 3   polyethylene glycol (MIRALAX) packet Take 17 g by mouth daily. Hold for diarrhea. 28 each 1   PREDNISONE PO Take 40 mg by mouth as needed (For foot).      psyllium (HYDROCIL/METAMUCIL) 95 % PACK Take 1 packet by mouth 3 (three) times daily. 56 each 0   RELION SHORT PEN NEEDLES 31G X 8 MM MISC USE ONE PEN NEEDLE ONCE DAILY AS DIRECTED 50 each 11   Rivaroxaban (XARELTO) 15 MG TABS tablet Take 1 tablet (15 mg total) by mouth daily. [place]  tablet 3   simvastatin (ZOCOR) 20 MG tablet TAKE ONE TABLET (20MG) BY MOUTH ONCE DAILY AT BEDTIME 90 tablet 3   No current facility-administered medications on file prior to visit.    Allergies  Allergen Reactions   Ace Inhibitors Other (See Comments)    Hyperkalemia   Actos [Pioglitazone] Swelling   Aspirin Other (See Comments)    G.I. Upset. In high doses   Metformin And Related Diarrhea   Eliquis [Apixaban] Rash and Other (See Comments)    Patient states that this med was d/ced by prescriber due to interference with allergies and states that she also had a breakout   Social History   Socioeconomic History   Marital status: Widowed    Spouse name: Not on file   Number of children: Not on file   Years of education: Not on file   Highest education level: Not on file  Occupational History   Occupation: retired  Scientist, product/process development strain: Not on file   Food insecurity:    Worry: Not on file    Inability: Not on file   Transportation needs:    Medical: Not on file    Non-medical: Not on file  Tobacco Use   Smoking status: Former Smoker    Packs/day: 0.50    Years: 50.00    Pack years: 25.00    Types: Cigarettes    Last attempt to quit: 01/19/2009    Years since quitting: 9.2   Smokeless tobacco: Never Used  Substance and Sexual Activity   Alcohol use: No    Alcohol/week: 0.0  standard drinks   Drug use: No   Sexual activity: Never  Lifestyle   Physical activity:    Days per week: Not on file    Minutes per session: Not on file   Stress: Not on file  Relationships   Social connections:    Talks on phone: Not on file    Gets together: Not on file    Attends religious service: Not on file    Active member of club or organization: Not on file    Attends meetings of clubs or organizations: Not on file    Relationship status: Not on file   Intimate partner violence:    Fear of current or ex partner: Not on file    Emotionally abused: Not on file    Physically abused: Not on file    Forced sexual activity: Not on file  Other Topics Concern   Not on file  Social History Narrative   Entered 10/2013:   She has never driven.   She lives with her son.      Review of Systems  All other systems reviewed and are negative.      Objective:   No physical exam was performed as the patient was seen over the telephone.  However the patient checked her blood pressure while I was on the phone with her and her blood pressure was 134/64.  Her heart rate was 65.  And her temperature was 98.3      Assessment & Plan:  Abdominal pain, LLQ  Diastolic CHF, acute on chronic (HCC)  Controlled type 2 diabetes mellitus with complication, with long-term current use of insulin ([place])  I believe her abdominal pain was due to diverticulitis however I believe constipation is also playing a large role in her abdominal pain and possibly triggering the episodes of diverticulitis.  Therefore I recommended that the patient continue to take  MiraLAX on a daily basis in an effort to try to help maintain regularity.  Regarding her diastolic congestive heart failure, her recent exacerbation with leg swelling has improved since she is consistently taking her Lasix 60 mg a day.  The swelling has improved.  She denies any chest pain or shortness of breath or dyspnea on exertion at the  present time.  Therefore I do not believe that the patient requires increased diuresis today.  Regarding her diabetes, her reported blood sugars sound excellent.  I see no reason to make any changes in her insulin dose.  Phone call concluded at 1051.  We will schedule follow-up to check lab work in 3 months.

## 2018-05-25 DIAGNOSIS — I5033 Acute on chronic diastolic (congestive) heart failure: Secondary | ICD-10-CM | POA: Diagnosis not present

## 2018-05-25 DIAGNOSIS — I509 Heart failure, unspecified: Secondary | ICD-10-CM | POA: Diagnosis not present

## 2018-05-25 DIAGNOSIS — J441 Chronic obstructive pulmonary disease with (acute) exacerbation: Secondary | ICD-10-CM | POA: Diagnosis not present

## 2018-05-25 DIAGNOSIS — J449 Chronic obstructive pulmonary disease, unspecified: Secondary | ICD-10-CM | POA: Diagnosis not present

## 2018-05-27 ENCOUNTER — Telehealth: Payer: Self-pay | Admitting: Family Medicine

## 2018-05-27 MED ORDER — INSULIN LISPRO (1 UNIT DIAL) 100 UNIT/ML (KWIKPEN): 7.0000 [IU] | PEN_INJECTOR | Freq: Three times a day (TID) | SUBCUTANEOUS | 2 refills | Status: DC

## 2018-05-27 NOTE — Telephone Encounter (Signed)
[facility]  Patient is calling to get her nova log pen

## 2018-05-27 NOTE — Telephone Encounter (Signed)
Switched med to Humalog as this is preferred on her insurance. Med sent to pharm and pt aware.

## 2018-05-30 ENCOUNTER — Other Ambulatory Visit: Payer: Self-pay | Admitting: *Deleted

## 2018-05-30 DIAGNOSIS — Z794 Long term (current) use of insulin: Principal | ICD-10-CM

## 2018-05-30 DIAGNOSIS — E118 Type 2 diabetes mellitus with unspecified complications: Secondary | ICD-10-CM

## 2018-05-30 MED ORDER — ACCU-CHEK SOFTCLIX LANCETS MISC: 5 refills | Status: DC

## 2018-05-30 MED ORDER — GLUCOSE BLOOD VI STRP: ORAL_STRIP | 5 refills | Status: DC

## 2018-05-30 MED ORDER — ACCU-CHEK AVIVA PLUS W/DEVICE KIT: PACK | 1 refills | Status: DC

## 2018-06-25 DIAGNOSIS — J441 Chronic obstructive pulmonary disease with (acute) exacerbation: Secondary | ICD-10-CM | POA: Diagnosis not present

## 2018-06-25 DIAGNOSIS — I509 Heart failure, unspecified: Secondary | ICD-10-CM | POA: Diagnosis not present

## 2018-06-25 DIAGNOSIS — J449 Chronic obstructive pulmonary disease, unspecified: Secondary | ICD-10-CM | POA: Diagnosis not present

## 2018-06-25 DIAGNOSIS — I5033 Acute on chronic diastolic (congestive) heart failure: Secondary | ICD-10-CM | POA: Diagnosis not present

## 2018-07-05 ENCOUNTER — Ambulatory Visit (INDEPENDENT_AMBULATORY_CARE_PROVIDER_SITE_OTHER): Payer: Medicare Other | Admitting: Family Medicine

## 2018-07-05 ENCOUNTER — Encounter: Payer: Self-pay | Admitting: Family Medicine

## 2018-07-05 ENCOUNTER — Other Ambulatory Visit: Payer: Self-pay

## 2018-07-05 VITALS — BP 118/72 | HR 59 | Temp 97.8°F | Resp 16 | Ht 63.0 in | Wt 173.0 lb

## 2018-07-05 DIAGNOSIS — E1165 Type 2 diabetes mellitus with hyperglycemia: Secondary | ICD-10-CM | POA: Diagnosis not present

## 2018-07-05 DIAGNOSIS — R109 Unspecified abdominal pain: Secondary | ICD-10-CM

## 2018-07-05 DIAGNOSIS — R140 Abdominal distension (gaseous): Secondary | ICD-10-CM | POA: Diagnosis not present

## 2018-07-05 NOTE — Progress Notes (Signed)
Subjective:    Patient ID: Laura Best, female    DOB: 1940/05/21, 78 y.o.   MRN: [id number]  HPI   11/22/17 Patient is here today to establish care with me.  Her previous primary care provider has left the practice.  Given her complicated medical history, I wanted to see the patient to determine her overall medical management.  First, the patient has insulin-dependent diabetes mellitus.  She is currently using Levemir 14 units a day.  She takes 7 units of NovoLog with lunch and supper.  Fasting blood sugars range between 89 and 180 with the majority being between 100-140.  Lunchtime sugars are typically 1 80-280 with the majority around 230.  Suppertime sugars are between 206 and 255.  Bedtime sugars are between 140 and 200.  Second, the patient has chronic diastolic heart failure.  She is frequently admitted to the hospital due to pulmonary edema.  Third she has COPD.  She has been treated several times in the past for COPD exacerbations according to her records.  On exam today, she has diminished breath sounds bilaterally with expiratory wheezes and rhonchorous breath sounds in both bases.  She is not on any maintenance therapy.  She reports dyspnea on exertion.  She also reports dyspnea with minimal activity.  At that time, my plan was: First going to work on her blood sugars.  I want to increase her Levemir from 14 units a day to 20 units a day.  Continue the 7 units of NovoLog with lunch and supper.  Recheck blood sugars in 2 weeks.  Second I want to work on her breathing.  She has diminished breath sounds bilaterally with expiratory wheezing and rhonchorous breath sounds.  I believe she would benefit from a daily maintenance therapy.  Therefore I will start the patient on Trelegy 1 inhalation a day.  I have given her samples.  I would like to see her back in 2 weeks to see if her breathing has improved on the maintenance therapy.  I hope that this also will help control or prevent future  exacerbations and reduce her chance of hospitalization.  12/11/17 I am ecstatic!.  Patient's fasting blood sugars are ranging between 67 and 137 however the vast majority are between 100-130.  Her prelunch sugars are between 150 and 180.  Her predinner sugars are between 111 and 193.  Her bedtime sugars are between 130 and 183.  She only has 2 values greater than 200.  This is much better than last time.  She also states that her breathing has improved dramatically since starting Trelegy.  She would like to continue this medication.  On examination today she does have faint bibasilar crackles however her expiratory wheezing has improved dramatically and her breath sounds have improved with improved aeration and more air movement.  At that time, my plan was: Make no further changes in insulin.  Continue Levemir 20 units a day and continue NovoLog 7 units before lunch and dinner.  The reported sugars she brings in today should equate to a hemoglobin A1c around 7 which is where I would stop with this patient.  I am very happy that her breathing has improved.  I will continue the patient on Trelegy 1 inhalation a day.  Discontinue Pulmicort.  02/18/18 Patient is here today complaining of one-week history of pain in her left lower quadrant.  She also reports nausea and vomiting.  She is tender to palpation in the left lower quadrant with voluntary  guarding.  She denies any rebound.  Bowel sounds are diminished throughout.  She has also been constipated.  She denies any diarrhea.  She denies any melena.  She denies any hematochezia.  She is overdue for lab work to monitor her blood sugar.  At that time, my plan was: I believe the patient has diverticulitis.  I will start the patient on Cipro 500 mg p.o. twice daily for 10 days and Flagyl 500 mg p.o. twice daily for 10 days and then reassess in 1 week.  While the patient is here I will check a CBC, CMP, fasting lipid panel, and hemoglobin A1c to monitor the  management of her diabetes.  Goal hemoglobin A1c is less than 7.5.  Goal LDL cholesterol is less than 100.  Her COPD appears stable.  She is not wheezing today on exam.  She has no rhonchi. There is no evidence of fluid overload regarding her diastolic congestive heart failure.  I will monitor a CMP to monitor her chronic kidney disease.  If abdominal pain worsens, she will need a CT scan.  Add Linzess 145 mcg p.o. daily for constipation  03/19/18  Since I last saw the patient, her left lower quadrant abdominal pain has improved.  She denies any pain in her lower abdomen today.  She has been using the Linzess and she states that on the [place] she has to go the bathroom 3 or 4 times every day.  In fact occasionally she is getting diarrhea.  She has gained 5 pounds since I last saw her.  She has pitting edema in both legs to the knees and also bibasilar crackles.  Patient has not been taking Lasix 60 mg every day.  Instead she has been alternating 60 mg 1 day with 40 mg the next day.  She denies any chest pain or shortness of breath.  She denies any dyspnea however she does appear to be slightly fluid overloaded.  Her diabetes test last time was 7.6 which given her frailty and medical comorbidities I feel is adequate.  Her cholesterol is outstanding.  She has chronic kidney disease that is stable with a creatinine around 1.7.  At that time, my plan was: Patient appears slightly fluid overloaded on exam.  I will asked her to take Lasix 60 mg every day rather than alternate 60 with 40.  Her chronic kidney disease is stable.  I would reassess the patient in 1 month to watch her closely and monitor her kidney function on the new dose of Lasix.  Patient is requesting that we discontinue oxygen.  She states that she has not used the oxygen in over 2 months.  She hates to have the oxygen and pay for it when she is not using it.  I believe this is reasonable.  I have asked her to discontinue the Linzess.  Although it is  helping with her chronic constipation I am concerned it may lead to dehydration.  Reassess the patient in 1 month.  03/28/18 Patient developed sharp left-sided abdominal pain this morning.  Patient states it hurts when she tries to walk to the bathroom.  Is extremely tender to palpation in the left upper quadrant and left lower quadrant.  She also has some tenderness in the right lower quadrant.  Patient states she has not had a normal bowel movement in more than a week.  When she has bowel movement and she is small pieces of stool.  She also reports dyspnea on exertion.  She  denies any melena.  She denies any hematochezia.  She denies any nausea or vomiting.  She denies any fever or chills however she is tender to palpation in the left lower quadrant and left upper quadrant.  Bowel sounds are diminished throughout.  Weight has increased steadily over the last few office visits.  Today she has +1 pitting edema in both legs and does report some dyspnea on exertion.  At that time, my plan was: EKG shows no evidence of ischemia or infarction.  Abdominal pain is concerning for possible colitis.  Cannot rule out partial bowel obstruction.  We will treat the patient for possible diverticulitis with Cipro 250 mg p.o. twice daily for 10 days and Flagyl 500 mg p.o. twice daily for 10 days.  We will add Linzess 290 mcg p.o. daily for chronic constipation as she has not had a bowel movement in almost a week that is normal.  Recheck the patient tomorrow.  If abdominal pains worsen she is to go immediately to the emergency room.  If pain is no better by tomorrow, she will need a CT scan of the abdomen and pelvis.  Check CBC to evaluate for leukocytosis as well as CMP to monitor renal function.  I believe the patient is also slightly fluid overloaded causing her shortness of breath.  Patient takes 60 mg of Lasix in the morning.  I have asked her to take an additional 40 mg tonight and I will reassess her breathing and her  weight tomorrow morning.  Check a BNP.  Emphasized to the patient that if abdominal pain worsens she needs to go the emergency room. Wt Readings from Last 3 Encounters:  03/29/18 178 lb (80.7 kg)  03/28/18 178 lb (80.7 kg)  03/19/18 176 lb (79.8 kg)  03/29/18 Abdominal pain is much better today.  She is no longer tender to palpation of the left lower quadrant.  She is still little sore there however it is much improved compared to yesterday.  She denies any nausea or vomiting.  She denies any fever.  She denies any melena or hematochezia.  She is still not having a good normal bowel movement despite taking Linzess yesterday.  She has not taken it yet today.  She is only had a few small pebble-like bowel movements today.  AT that time, my plan was: Exam today is normal.  She does continue to have some edema however it is clinically improved.  BNP was only slightly elevated and therefore I would not continue increased diuretics for prolonged period of time.  I have instructed the patient to resume her previous dose.  I believe the majority of her abdominal pain could have been from diverticulitis as it has improved on antibiotics however I do believe constipation is playing a role as well.  I encouraged the patient to continue Linzess 290 g daily until she is having regular bowel movements and then switch to 145 mcg daily thereafter to maintain and treat her chronic constipation.  Obtain x-ray of the abdomen to evaluate further as I suspect it will show a very large stool burden and rule out problems such as ileus or partial bowel obstruction however clinically the patient is much improved and no longer has evidence of any abdominal tenderness  07/05/18 Wt Readings from Last 3 Encounters:  07/05/18 173 lb (78.5 kg)  03/29/18 178 lb (80.7 kg)  03/28/18 178 lb (80.7 kg)   Patient reports 1 week history abdominal bloating.  She also reports some mild  tenderness to palpation in the left lower quadrant.  On  examination today, her abdomen is soft, mildly distended, there is subjective tenderness to palpation in the left lower quadrant however there is no guarding or rebound and she seems extremely comfortable when I palpate her belly.  She states that she is having a bowel movement every 3 to 4 days.  She also reports feeling bloated and full.  She denies any chest pain or shortness of breath or dyspnea on exertion.  She denies any melena or hematochezia.  She has trace to +1 edema in both ankles but her lungs are clear to auscultation bilaterally.  She denies any nausea or vomiting.  She denies any dysuria or hematuria  Past Medical History:  Diagnosis Date  . Arthritis    "hands, feet" (02/17/2016)  . CAD (coronary artery disease)    a. 05/2016: cath showing nonobstructive CAD with 50% prox-Cx stenosis  . CKD (chronic kidney disease) stage 3, GFR 30-59 ml/min (HCC) 12/25/2012  . COPD (chronic obstructive pulmonary disease) ([place])   . Diastolic heart failure ([place])   . Hyperlipidemia   . Hypertension   . Osteoporosis   . Pneumonia    "several times" (02/17/2016)  . Small bowel obstruction ([place]) 02/17/2016  . Tricuspid regurgitation    Echo 05/02/06-Nml LV. Mild MR. Mod TR  . Type II diabetes mellitus ([place])   . Vitamin D deficiency    Past Surgical History:  Procedure Laterality Date  . APPENDECTOMY    . HEART CHAMBER REVISION     patched hole --[place]  . LAPAROSCOPIC APPENDECTOMY N/A 09/27/2013   Procedure: APPENDECTOMY - CONVERTED FROM LAPAROSCOPIC;  Surgeon: Donnie Mesa, MD;  Location: [facility];  Service: General;  Laterality: N/A;  . RIGHT/LEFT HEART CATH AND CORONARY ANGIOGRAPHY N/A 05/24/2016   Procedure: Right/Left Heart Cath and Coronary Angiography;  Surgeon: Larey Dresser, MD;  Location: [facility];  Service: Cardiovascular;  Laterality: N/A;  . TUBAL LIGATION     Current Outpatient Medications on File Prior to Visit  Medication Sig Dispense Refill  . Accu-Chek Softclix  Lancets lancets Check blood sugar 3 times daily before meals and at bedtime. DX:E11.9 200 each 5  . acetaminophen (TYLENOL) 500 MG tablet Take 500 mg by mouth every 6 (six) hours as needed for pain.    Marland Kitchen amLODipine (NORVASC) 10 MG tablet Take 0.5 tablets (5 mg total) by mouth daily. 45 tablet 3  . BD PEN NEEDLE MICRO U/F 32G X 6 MM MISC USE AS DIRECTED THREE TIMES DAILY 300 each 2  . Blood Glucose Monitoring Suppl (ACCU-CHEK AVIVA PLUS) w/Device KIT Check blood sugar 3 times daily before meals and at bedtime. DX:E11.9 1 kit 1  . calcitRIOL (ROCALTROL) 0.25 MCG capsule Take 1 capsule (0.25 mcg total) by mouth every Monday, Wednesday, and Friday. 90 capsule 1  . Cholecalciferol (VITAMIN D) 2000 UNITS CAPS Take 2,000 Units by mouth daily.     . cloNIDine (CATAPRES) 0.3 MG tablet Take 1 tablet (0.3 mg total) by mouth 2 (two) times daily. 180 tablet 3  . docusate sodium (COLACE) 100 MG capsule Take 1 capsule (100 mg total) by mouth 2 (two) times daily. 60 capsule 1  . febuxostat (ULORIC) 40 MG tablet Take 1 tablet (40 mg total) by mouth daily. 90 tablet 3  . Fluticasone-Umeclidin-Vilant (TRELEGY ELLIPTA) 100-62.5-25 MCG/INH AEPB Inhale 1 Inhaler into the lungs daily. 1 each 11  . gabapentin (NEURONTIN) 100 MG capsule TAKE 1 TO  2 CAPSULES BY MOUTH AT BEDTIME FOR PERIPHERAL NEUROPATHY. 180 capsule 3  . glucose blood (ACCU-CHEK AVIVA PLUS) test strip Check blood sugar 3 times daily before meals and at bedtime. DX:E11.9 200 each 5  . Insulin Detemir (LEVEMIR FLEXTOUCH) 100 UNIT/ML Pen Inject 20 Units into the skin at bedtime. SUBCUTANEOUSLY ONCE DAILY AT  10  PM 15 pen 3  . insulin lispro (HUMALOG KWIKPEN) 100 UNIT/ML KwikPen Inject 0.07 mLs (7 Units total) into the skin 3 (three) times daily. 15 pen 2  . ipratropium-albuterol (DUONEB) 0.5-2.5 (3) MG/3ML SOLN Take 3 mLs by nebulization every 6 (six) hours as needed. 360 mL 5  . omeprazole (PRILOSEC) 20 MG capsule Take 1 capsule (20 mg total) by mouth daily.  90 capsule 3  . polyethylene glycol (MIRALAX) packet Take 17 g by mouth daily. Hold for diarrhea. 28 each 1  . PREDNISONE PO Take 40 mg by mouth as needed (For foot).     . psyllium (HYDROCIL/METAMUCIL) 95 % PACK Take 1 packet by mouth 3 (three) times daily. 56 each 0  . RELION SHORT PEN NEEDLES 31G X 8 MM MISC USE ONE PEN NEEDLE ONCE DAILY AS DIRECTED 50 each 11  . Rivaroxaban (XARELTO) 15 MG TABS tablet Take 1 tablet (15 mg total) by mouth daily. 90 tablet 3  . simvastatin (ZOCOR) 20 MG tablet TAKE ONE TABLET ('20MG'$ ) BY MOUTH ONCE DAILY AT BEDTIME 90 tablet 3  . carvedilol (COREG) 12.5 MG tablet Take 1 tablet (12.5 mg total) by mouth 2 (two) times daily. 180 tablet 3  . furosemide (LASIX) 40 MG tablet Take 1.5 tablets (60 mg total) by mouth daily. Take 60 mg daily 135 tablet 3   No current facility-administered medications on file prior to visit.     Allergies  Allergen Reactions  . Ace Inhibitors Other (See Comments)    Hyperkalemia  . Actos [Pioglitazone] Swelling  . Aspirin Other (See Comments)    G.I. Upset. In high doses  . Metformin And Related Diarrhea  . Eliquis [Apixaban] Rash and Other (See Comments)    Patient states that this med was d/ced by prescriber due to interference with allergies and states that she also had a breakout   Social History   Socioeconomic History  . Marital status: Widowed    Spouse name: Not on file  . Number of children: Not on file  . Years of education: Not on file  . Highest education level: Not on file  Occupational History  . Occupation: retired  Scientific laboratory technician  . Financial resource strain: Not on file  . Food insecurity    Worry: Not on file    Inability: Not on file  . Transportation needs    Medical: Not on file    Non-medical: Not on file  Tobacco Use  . Smoking status: Former Smoker    Packs/day: 0.50    Years: 50.00    Pack years: 25.00    Types: Cigarettes    Quit date: 01/19/2009    Years since quitting: 9.4  . Smokeless  tobacco: Never Used  Substance and Sexual Activity  . Alcohol use: No    Alcohol/week: 0.0 standard drinks  . Drug use: No  . Sexual activity: Never  Lifestyle  . Physical activity    Days per week: Not on file    Minutes per session: Not on file  . Stress: Not on file  Relationships  . Social connections    Talks on phone: Not on  file    Gets together: Not on file    Attends religious service: Not on file    Active member of club or organization: Not on file    Attends meetings of clubs or organizations: Not on file    Relationship status: Not on file  . Intimate partner violence    Fear of current or ex partner: Not on file    Emotionally abused: Not on file    Physically abused: Not on file    Forced sexual activity: Not on file  Other Topics Concern  . Not on file  Social History Narrative   Entered 10/2013:   She has never driven.   She lives with her son.      Review of Systems  All other systems reviewed and are negative.      Objective:   Physical Exam Vitals signs reviewed.  Constitutional:      General: She is not in acute distress.    Appearance: She is well-developed. She is not diaphoretic.  HENT:     Head: Normocephalic and atraumatic.  Neck:     Musculoskeletal: Neck supple.     Vascular: No JVD.  Cardiovascular:     Rate and Rhythm: Normal rate. Rhythm irregularly irregular.  Pulmonary:     Effort: Pulmonary effort is normal. No respiratory distress.     Breath sounds: No wheezing, rhonchi or rales.  Abdominal:     General: Bowel sounds are normal. There is no distension.     Palpations: Abdomen is soft. There is no mass.     Tenderness: There is no abdominal tenderness. There is no right CVA tenderness, left CVA tenderness, guarding or rebound.     Hernia: No hernia is present.  Musculoskeletal:     Right lower leg: Edema present.     Left lower leg: Edema present.  Lymphadenopathy:     Cervical: No cervical adenopathy.            Assessment & Plan:  1. Uncontrolled type 2 diabetes mellitus with hyperglycemia ([place]) While the patient is here today I will check hemoglobin A1c to monitor the management of her diabetes.  I would be extremely be if her hemoglobin A1c is less than 7.5.  I will also monitor her chronic kidney disease by checking a CMP - Hemoglobin A1c - CBC with Differential/Platelet - COMPLETE METABOLIC PANEL WITH GFR  2. Bloating  3. Left sided abdominal pain I believe her bloating and left lower quadrant abdominal pain are due to chronic constipation.  She saw no benefit from [place].  Therefore I will try Amitiza 24 mcg p.o. twice daily and reassess the patient via telephone in 1 week.  Encouraged her to continue her Dulcolax.

## 2018-07-06 LAB — CBC WITH DIFFERENTIAL/PLATELET
Absolute Monocytes: 660 cells/uL (ref 200–950)
Basophils Absolute: 61 cells/uL (ref 0–200)
Basophils Relative: 1.1 %
Eosinophils Absolute: 138 cells/uL (ref 15–500)
Eosinophils Relative: 2.5 %
HCT: 35.2 % (ref 35.0–45.0)
Hemoglobin: 11.2 g/dL — ABNORMAL LOW (ref 11.7–15.5)
Lymphs Abs: 1128 cells/uL (ref 850–3900)
MCH: 27.7 pg (ref 27.0–33.0)
MCHC: 31.8 g/dL — ABNORMAL LOW (ref 32.0–36.0)
MCV: 87.1 fL (ref 80.0–100.0)
MPV: 11.6 fL (ref 7.5–12.5)
Monocytes Relative: 12 %
Neutro Abs: 3515 cells/uL (ref 1500–7800)
Neutrophils Relative %: 63.9 %
Platelets: 170 10*3/uL (ref 140–400)
RBC: 4.04 10*6/uL (ref 3.80–5.10)
RDW: 15.3 % — ABNORMAL HIGH (ref 11.0–15.0)
Total Lymphocyte: 20.5 %
WBC: 5.5 10*3/uL (ref 3.8–10.8)

## 2018-07-06 LAB — COMPLETE METABOLIC PANEL WITH GFR
AG Ratio: 1.7 (calc) (ref 1.0–2.5)
ALT: 16 U/L (ref 6–29)
AST: 19 U/L (ref 10–35)
Albumin: 3.9 g/dL (ref 3.6–5.1)
Alkaline phosphatase (APISO): 88 U/L (ref 37–153)
BUN/Creatinine Ratio: 17 (calc) (ref 6–22)
BUN: 29 mg/dL — ABNORMAL HIGH (ref 7–25)
CO2: 30 mmol/L (ref 20–32)
Calcium: 8.8 mg/dL (ref 8.6–10.4)
Chloride: 105 mmol/L (ref 98–110)
Creat: 1.69 mg/dL — ABNORMAL HIGH (ref 0.60–0.93)
GFR, Est African American: 33 mL/min/{1.73_m2} — ABNORMAL LOW (ref >=60)
GFR, Est Non African American: 29 mL/min/{1.73_m2} — ABNORMAL LOW (ref >=60)
Globulin: 2.3 g/dL (calc) (ref 1.9–3.7)
Glucose, Bld: 180 mg/dL — ABNORMAL HIGH (ref 65–99)
Potassium: 4 mmol/L (ref 3.5–5.3)
Sodium: 141 mmol/L (ref 135–146)
Total Bilirubin: 0.5 mg/dL (ref 0.2–1.2)
Total Protein: 6.2 g/dL (ref 6.1–8.1)

## 2018-07-06 LAB — HEMOGLOBIN A1C
Hgb A1c MFr Bld: 7.4 % of total Hgb — ABNORMAL HIGH (ref <5.7)
Mean Plasma Glucose: 166 (calc)
eAG (mmol/L): 9.2 (calc)

## 2018-07-12 ENCOUNTER — Telehealth: Payer: Self-pay | Admitting: Family Medicine

## 2018-07-12 NOTE — Telephone Encounter (Signed)
Take miralax daily and add dulcolax otc poqday prn constipation

## 2018-07-12 NOTE — Telephone Encounter (Signed)
Pt called to let us know the [place] Pierini is not working for her constipation either.   Please advise.

## 2018-07-16 MED ORDER — LUBIPROSTONE 24 MCG PO CAPS: 24.0000 ug | ORAL_CAPSULE | Freq: Two times a day (BID) | ORAL | 3 refills | Status: DC

## 2018-07-16 NOTE — Telephone Encounter (Signed)
Spoke to pt and the medication did start working yesterday and would like to have rx sent to pharm. Med sent to requested pharm.

## 2018-07-25 DIAGNOSIS — I5033 Acute on chronic diastolic (congestive) heart failure: Secondary | ICD-10-CM | POA: Diagnosis not present

## 2018-07-25 DIAGNOSIS — J441 Chronic obstructive pulmonary disease with (acute) exacerbation: Secondary | ICD-10-CM | POA: Diagnosis not present

## 2018-07-25 DIAGNOSIS — I509 Heart failure, unspecified: Secondary | ICD-10-CM | POA: Diagnosis not present

## 2018-07-25 DIAGNOSIS — J449 Chronic obstructive pulmonary disease, unspecified: Secondary | ICD-10-CM | POA: Diagnosis not present

## 2018-08-15 ENCOUNTER — Ambulatory Visit (INDEPENDENT_AMBULATORY_CARE_PROVIDER_SITE_OTHER): Payer: Medicare Other | Admitting: Cardiology

## 2018-08-15 ENCOUNTER — Encounter: Payer: Self-pay | Admitting: Cardiology

## 2018-08-15 ENCOUNTER — Other Ambulatory Visit: Payer: Self-pay

## 2018-08-15 VITALS — BP 170/72 | HR 65 | Temp 96.8°F | Ht 63.0 in | Wt 167.0 lb

## 2018-08-15 DIAGNOSIS — I10 Essential (primary) hypertension: Secondary | ICD-10-CM | POA: Diagnosis not present

## 2018-08-15 DIAGNOSIS — I272 Pulmonary hypertension, unspecified: Secondary | ICD-10-CM | POA: Diagnosis not present

## 2018-08-15 DIAGNOSIS — I480 Paroxysmal atrial fibrillation: Secondary | ICD-10-CM | POA: Diagnosis not present

## 2018-08-15 DIAGNOSIS — I5032 Chronic diastolic (congestive) heart failure: Secondary | ICD-10-CM

## 2018-08-15 NOTE — Progress Notes (Signed)
Clinical Summary Laura Best is a 78 y.o.female  1.Chronic diastolic heart failure - can have some SOB that is stable. Some recent edema - home weights variable. This morning 175 lbs. Previous baseline around 170.   - no recent edema. Weight down to 167 lbs, was 173 lbs 06/2018. No SOB or DOE   2. Afib - new diagnosis during 12/2016 admission.    - no recent palpitations   3. HTN - Cr 2.07 BUN 77 GFR 23, from notes there is a question of possible right renal artery stenosis.   - checks at home daily, typically SBP 120s-130s   4. Pulmonary venous HTN - RHC as reported below.  -no recent edema  5. History of ASD repair - no evidence of shunt by prior cath  6. CKDIV - followed by nephrology   7. Hyperlipidemia - labs followed by pcp   Past Medical History:  Diagnosis Date  . Arthritis    "hands, feet" (02/17/2016)  . CAD (coronary artery disease)    a. 05/2016: cath showing nonobstructive CAD with 50% prox-Cx stenosis  . CKD (chronic kidney disease) stage 3, GFR 30-59 ml/min (HCC) 12/25/2012  . COPD (chronic obstructive pulmonary disease) ([place])   . Diastolic heart failure ([place])   . Hyperlipidemia   . Hypertension   . Osteoporosis   . Pneumonia    "several times" (02/17/2016)  . Small bowel obstruction ([place]) 02/17/2016  . Tricuspid regurgitation    Echo 05/02/06-Nml LV. Mild MR. Mod TR  . Type II diabetes mellitus ([place])   . Vitamin D deficiency      Allergies  Allergen Reactions  . Ace Inhibitors Other (See Comments)    Hyperkalemia  . Actos [Pioglitazone] Swelling  . Aspirin Other (See Comments)    G.I. Upset. In high doses  . Metformin And Related Diarrhea  . Eliquis [Apixaban] Rash and Other (See Comments)    Patient states that this med was d/ced by prescriber due to interference with allergies and states that she also had a breakout     Current Outpatient Medications  Medication Sig Dispense Refill  . Accu-Chek Softclix Lancets  lancets Check blood sugar 3 times daily before meals and at bedtime. DX:E11.9 200 each 5  . acetaminophen (TYLENOL) 500 MG tablet Take 500 mg by mouth every 6 (six) hours as needed for pain.    Marland Kitchen amLODipine (NORVASC) 10 MG tablet Take 0.5 tablets (5 mg total) by mouth daily. 45 tablet 3  . BD PEN NEEDLE MICRO U/F 32G X 6 MM MISC USE AS DIRECTED THREE TIMES DAILY 300 each 2  . Blood Glucose Monitoring Suppl (ACCU-CHEK AVIVA PLUS) w/Device KIT Check blood sugar 3 times daily before meals and at bedtime. DX:E11.9 1 kit 1  . calcitRIOL (ROCALTROL) 0.25 MCG capsule Take 1 capsule (0.25 mcg total) by mouth every Monday, Wednesday, and Friday. 90 capsule 1  . carvedilol (COREG) 12.5 MG tablet Take 1 tablet (12.5 mg total) by mouth 2 (two) times daily. 180 tablet 3  . Cholecalciferol (VITAMIN D) 2000 UNITS CAPS Take 2,000 Units by mouth daily.     . cloNIDine (CATAPRES) 0.3 MG tablet Take 1 tablet (0.3 mg total) by mouth 2 (two) times daily. 180 tablet 3  . docusate sodium (COLACE) 100 MG capsule Take 1 capsule (100 mg total) by mouth 2 (two) times daily. 60 capsule 1  . febuxostat (ULORIC) 40 MG tablet Take 1 tablet (40 mg total) by mouth daily. 90 tablet 3  .  Fluticasone-Umeclidin-Vilant (TRELEGY ELLIPTA) 100-62.5-25 MCG/INH AEPB Inhale 1 Inhaler into the lungs daily. 1 each 11  . furosemide (LASIX) 40 MG tablet Take 1.5 tablets (60 mg total) by mouth daily. Take 60 mg daily 135 tablet 3  . gabapentin (NEURONTIN) 100 MG capsule TAKE 1 TO 2 CAPSULES BY MOUTH AT BEDTIME FOR PERIPHERAL NEUROPATHY. 180 capsule 3  . glucose blood (ACCU-CHEK AVIVA PLUS) test strip Check blood sugar 3 times daily before meals and at bedtime. DX:E11.9 200 each 5  . Insulin Detemir (LEVEMIR FLEXTOUCH) 100 UNIT/ML Pen Inject 20 Units into the skin at bedtime. SUBCUTANEOUSLY ONCE DAILY AT  10  PM 15 pen 3  . insulin lispro (HUMALOG KWIKPEN) 100 UNIT/ML KwikPen Inject 0.07 mLs (7 Units total) into the skin 3 (three) times daily. 15  pen 2  . ipratropium-albuterol (DUONEB) 0.5-2.5 (3) MG/3ML SOLN Take 3 mLs by nebulization every 6 (six) hours as needed. 360 mL 5  . lubiprostone (AMITIZA) 24 MCG capsule Take 1 capsule (24 mcg total) by mouth 2 (two) times daily with a meal. 60 capsule 3  . omeprazole (PRILOSEC) 20 MG capsule Take 1 capsule (20 mg total) by mouth daily. 90 capsule 3  . polyethylene glycol (MIRALAX) packet Take 17 g by mouth daily. Hold for diarrhea. 28 each 1  . PREDNISONE PO Take 40 mg by mouth as needed (For foot).     . psyllium (HYDROCIL/METAMUCIL) 95 % PACK Take 1 packet by mouth 3 (three) times daily. 56 each 0  . RELION SHORT PEN NEEDLES 31G X 8 MM MISC USE ONE PEN NEEDLE ONCE DAILY AS DIRECTED 50 each 11  . Rivaroxaban (XARELTO) 15 MG TABS tablet Take 1 tablet (15 mg total) by mouth daily. 90 tablet 3  . simvastatin (ZOCOR) 20 MG tablet TAKE ONE TABLET ('20MG'$ ) BY MOUTH ONCE DAILY AT BEDTIME 90 tablet 3   No current facility-administered medications for this visit.      Past Surgical History:  Procedure Laterality Date  . APPENDECTOMY    . HEART CHAMBER REVISION     patched hole --[place]  . LAPAROSCOPIC APPENDECTOMY N/A 09/27/2013   Procedure: APPENDECTOMY - CONVERTED FROM LAPAROSCOPIC;  Surgeon: Donnie Mesa, MD;  Location: [facility];  Service: General;  Laterality: N/A;  . RIGHT/LEFT HEART CATH AND CORONARY ANGIOGRAPHY N/A 05/24/2016   Procedure: Right/Left Heart Cath and Coronary Angiography;  Surgeon: Larey Dresser, MD;  Location: [facility];  Service: Cardiovascular;  Laterality: N/A;  . TUBAL LIGATION       Allergies  Allergen Reactions  . Ace Inhibitors Other (See Comments)    Hyperkalemia  . Actos [Pioglitazone] Swelling  . Aspirin Other (See Comments)    G.I. Upset. In high doses  . Metformin And Related Diarrhea  . Eliquis [Apixaban] Rash and Other (See Comments)    Patient states that this med was d/ced by prescriber due to interference with allergies and states that  she also had a breakout      Family History  Problem Relation Age of Onset  . Cancer Father   . Diabetes Father   . Cancer Brother        Brain  . Cancer Sister        abdominal fat     Social History Laura Best reports that she quit smoking about 9 years ago. Her smoking use included cigarettes. She has a 25.00 pack-year smoking history. She has never used smokeless tobacco. Laura Best reports no history of  alcohol use.   Review of Systems CONSTITUTIONAL: No weight loss, fever, chills, weakness or fatigue.  HEENT: Eyes: No visual loss, blurred vision, double vision or yellow sclerae.No hearing loss, sneezing, congestion, runny nose or sore throat.  SKIN: No rash or itching.  CARDIOVASCULAR: per hpi RESPIRATORY: No shortness of breath, cough or sputum.  GASTROINTESTINAL: No anorexia, nausea, vomiting or diarrhea. No abdominal pain or blood.  GENITOURINARY: No burning on urination, no polyuria NEUROLOGICAL: No headache, dizziness, syncope, paralysis, ataxia, numbness or tingling in the extremities. No change in bowel or bladder control.  MUSCULOSKELETAL: No muscle, back pain, joint pain or stiffness.  LYMPHATICS: No enlarged nodes. No history of splenectomy.  PSYCHIATRIC: No history of depression or anxiety.  ENDOCRINOLOGIC: No reports of sweating, cold or heat intolerance. No polyuria or polydipsia.  Marland Kitchen   Physical Examination Today's Vitals   08/15/18 1302  BP: (!) 170/72  Pulse: 65  Temp: (!) 96.8 F (36 C)  TempSrc: Temporal  SpO2: 96%  Weight: 167 lb (75.8 kg)  Height: '5\' 3"'$  (1.6 m)   Body mass index is 29.58 kg/m.  Gen: resting comfortably, no acute distress HEENT: no scleral icterus, pupils equal round and reactive, no palptable cervical adenopathy,  CV: RRR, no m/r/g, no jvd Resp: Clear to auscultation bilaterally GI: abdomen is soft, non-tender, non-distended, normal bowel sounds, no hepatosplenomegaly MSK: extremities are warm, no edema.  Skin: warm, no  rash Neuro:  no focal deficits Psych: appropriate affect   Diagnostic Studies 12/2012 Echo LVEF 55%, mild LVH, grade II diastolic dysfunction,   9/74/16 EKG: sinus bradycardia rate 59  05/2016 LHC/RHC 1. Near-normal filling pressures.  2. No oxygen saturation step-up between RA and PA to suggest residual ASD.  3. Mild pulmonary hypertension with low PVR, suggests pulmonary venous hypertension.  4. Nonobstructive CAD, most significant disease being serial 50% stenoses in the proximal to mid LCx.   Would recommend aspirin and statin treatment for CAD.   She is on an adequate dose of Lasix, would not change.   Hydration post-procedure given CKD.  03/2016 echo Study Conclusions  - Left ventricle: The cavity size was normal. There was moderate concentric hypertrophy. Systolic function was normal. The estimated ejection fraction was in the range of 55% to 60%. Wall motion was normal; there were no regional wall motion abnormalities. Features are consistent with a pseudonormal left ventricular filling pattern, with concomitant abnormal relaxation and increased filling pressure (grade 2 diastolic dysfunction). - Mitral valve: Calcified annulus. There was trivial regurgitation. - Left atrium: The atrium was moderately dilated. - Right ventricle: The cavity size was moderately dilated. Wall thickness was normal. Systolic function was mildly to moderately reduced. - Pulmonary arteries: PA peak pressure: 49 mm Hg (S).  Impressions:  - The right ventricular systolic pressure was increased consistent with moderate pulmonary hypertension.    Assessment and Plan  1. Chronic diastolic heart failure/Pulmonary venous HTN -doing well, no recent symptoms, weight is lowest its been in some time - continue current meds  2. HTN -elevaetd today  But at goal based on home numbers, continue current meds    3. Hyperlipidemia - request labs from pcp,  continue statin  4. PAF - no symptoms, continue current meds    Arnoldo Lenis, M.D.

## 2018-08-15 NOTE — Patient Instructions (Signed)

## 2018-10-05 ENCOUNTER — Other Ambulatory Visit: Payer: Self-pay

## 2018-10-05 ENCOUNTER — Encounter (HOSPITAL_COMMUNITY): Payer: Self-pay | Admitting: Emergency Medicine

## 2018-10-05 ENCOUNTER — Emergency Department (HOSPITAL_COMMUNITY)
Admission: EM | Admit: 2018-10-05 | Discharge: 2018-10-05 | Disposition: A | Discharge status: Home / Self Care | Payer: Medicare Other | Attending: Emergency Medicine | Admitting: Emergency Medicine

## 2018-10-05 ENCOUNTER — Emergency Department (HOSPITAL_COMMUNITY): Payer: Medicare Other

## 2018-10-05 DIAGNOSIS — Z794 Long term (current) use of insulin: Secondary | ICD-10-CM | POA: Insufficient documentation

## 2018-10-05 DIAGNOSIS — E1122 Type 2 diabetes mellitus with diabetic chronic kidney disease: Secondary | ICD-10-CM | POA: Insufficient documentation

## 2018-10-05 DIAGNOSIS — Z79899 Other long term (current) drug therapy: Secondary | ICD-10-CM | POA: Diagnosis not present

## 2018-10-05 DIAGNOSIS — R1012 Left upper quadrant pain: Secondary | ICD-10-CM | POA: Insufficient documentation

## 2018-10-05 DIAGNOSIS — I5033 Acute on chronic diastolic (congestive) heart failure: Secondary | ICD-10-CM | POA: Insufficient documentation

## 2018-10-05 DIAGNOSIS — I130 Hypertensive heart and chronic kidney disease with heart failure and stage 1 through stage 4 chronic kidney disease, or unspecified chronic kidney disease: Secondary | ICD-10-CM | POA: Insufficient documentation

## 2018-10-05 DIAGNOSIS — N183 Chronic kidney disease, stage 3 (moderate): Secondary | ICD-10-CM | POA: Insufficient documentation

## 2018-10-05 DIAGNOSIS — Z7901 Long term (current) use of anticoagulants: Secondary | ICD-10-CM | POA: Diagnosis not present

## 2018-10-05 DIAGNOSIS — Z87891 Personal history of nicotine dependence: Secondary | ICD-10-CM | POA: Insufficient documentation

## 2018-10-05 DIAGNOSIS — K5730 Diverticulosis of large intestine without perforation or abscess without bleeding: Secondary | ICD-10-CM | POA: Diagnosis not present

## 2018-10-05 LAB — COMPREHENSIVE METABOLIC PANEL
ALT: 16 U/L (ref 0–44)
AST: 17 U/L (ref 15–41)
Albumin: 4 g/dL (ref 3.5–5.0)
Alkaline Phosphatase: 100 U/L (ref 38–126)
Anion gap: 11 (ref 5–15)
BUN: 27 mg/dL — ABNORMAL HIGH (ref 8–23)
CO2: 24 mmol/L (ref 22–32)
Calcium: 8.9 mg/dL (ref 8.9–10.3)
Chloride: 106 mmol/L (ref 98–111)
Creatinine, Ser: 1.53 mg/dL — ABNORMAL HIGH (ref 0.44–1.00)
GFR calc Af Amer: 37 mL/min — ABNORMAL LOW (ref >60)
GFR calc non Af Amer: 32 mL/min — ABNORMAL LOW (ref >60)
Glucose, Bld: 201 mg/dL — ABNORMAL HIGH (ref 70–99)
Potassium: 4 mmol/L (ref 3.5–5.1)
Sodium: 141 mmol/L (ref 135–145)
Total Bilirubin: 0.5 mg/dL (ref 0.3–1.2)
Total Protein: 7.7 g/dL (ref 6.5–8.1)

## 2018-10-05 LAB — LIPASE, BLOOD: Lipase: 21 U/L (ref 11–51)

## 2018-10-05 LAB — CBC
HCT: 41 % (ref 36.0–46.0)
Hemoglobin: 12.5 g/dL (ref 12.0–15.0)
MCH: 27.4 pg (ref 26.0–34.0)
MCHC: 30.5 g/dL (ref 30.0–36.0)
MCV: 89.7 fL (ref 80.0–100.0)
Platelets: 175 10*3/uL (ref 150–400)
RBC: 4.57 MIL/uL (ref 3.87–5.11)
RDW: 15.7 % — ABNORMAL HIGH (ref 11.5–15.5)
WBC: 7.5 10*3/uL (ref 4.0–10.5)
nRBC: 0 % (ref 0.0–0.2)

## 2018-10-05 MED ORDER — ONDANSETRON 4 MG PO TBDP: 4.0000 mg | ORAL_TABLET | Freq: Three times a day (TID) | ORAL | 1 refills | Status: DC | PRN

## 2018-10-05 MED ORDER — BARIUM SULFATE 2.1 % PO SUSP
ORAL | Status: AC
  Filled 2018-10-05: qty 2

## 2018-10-05 MED ORDER — ONDANSETRON HCL 4 MG/2ML IJ SOLN: 4.0000 mg | Freq: Once | INTRAMUSCULAR | Status: DC

## 2018-10-05 NOTE — ED Triage Notes (Signed)
Pt c/o LT sided abdominal pain with nausea and vomiting "white phlegm" that began last night after patient ate dinner. Denies diarrhea.

## 2018-10-05 NOTE — ED Provider Notes (Signed)
[facility] Provider Note   CSN: [id number] Arrival date & time: 10/05/18  1601     History   Chief Complaint Chief Complaint  Patient presents with  . Abdominal Pain    HPI Laura Best is a 78 y.o. female.     Patient with a complaint of left-sided abdominal pain mostly left upper quadrant.  Began last night.  Patient's had a history of diverticulitis in the past also she has an umbilical hernia she was worried about both of these.  Patient gives a history of nausea but no true vomiting.  No diarrhea.  No fevers no dysuria.     Past Medical History:  Diagnosis Date  . Arthritis    "hands, feet" (02/17/2016)  . CAD (coronary artery disease)    a. 05/2016: cath showing nonobstructive CAD with 50% prox-Cx stenosis  . CKD (chronic kidney disease) stage 3, GFR 30-59 ml/min (HCC) 12/25/2012  . COPD (chronic obstructive pulmonary disease) ([place])   . Diastolic heart failure ([place])   . Hyperlipidemia   . Hypertension   . Osteoporosis   . Pneumonia    "several times" (02/17/2016)  . Small bowel obstruction ([place]) 02/17/2016  . Tricuspid regurgitation    Echo 05/02/06-Nml LV. Mild MR. Mod TR  . Type II diabetes mellitus ([place])   . Vitamin D deficiency     Patient Active Problem List   Diagnosis Date Noted  . Acute respiratory failure with hypoxia ([place]) 10/22/2017  . Uncontrolled type 2 diabetes mellitus with hyperglycemia ([place]) 10/22/2017  . Atrial fibrillation, chronic 10/22/2017  . Acute on chronic respiratory failure ([place]) 10/22/2017  . Persistent atrial fibrillation   . Acute on chronic respiratory failure with hypoxia ([place]) 01/13/2017  . Diabetic peripheral neuropathy associated with type 2 diabetes mellitus ([place]) 05/11/2016  . PAH (pulmonary artery hypertension) ([place])   . History of atrial septal defect repair   . Leukocytosis 04/06/2016  . Diastolic CHF, acute on chronic (HCC) 04/05/2016  . SBO (small bowel obstruction) ([place]) 02/17/2016  .  Chronic diastolic congestive heart failure ([place]) 02/17/2016  . Diabetes mellitus with complication ([place]) 123456  . Pyuria 02/17/2016  . GERD (gastroesophageal reflux disease) 04/05/2015  . Acute diverticulitis 05/03/2014  . Bradycardia 04/02/2013  . Hyperkalemia 01/22/2013  . Anemia of chronic disease 01/21/2013  . Acute bronchitis 01/20/2013  . CKD (chronic kidney disease) stage 3, GFR 30-59 ml/min (HCC) 12/25/2012  . Screening for colorectal cancer 12/25/2012  . Breast cancer screening 12/25/2012  . Vitamin D deficiency   . Osteoporosis   . Postop check 08/13/2012  . Appendiceal abscess 07/30/2012  . Fever, unspecified 07/19/2012  . Abdominal pain 07/19/2012  . SOB (shortness of breath) 07/19/2012  . ARF (acute renal failure) ([place]) 07/19/2012  . Abscess, appendix 07/19/2012  . Type 2 diabetes with nephropathy ([place]) 02/02/2011  . Pulmonary hypertension ([place]) 02/02/2011  . COPD exacerbation ([place]) 02/02/2011  . Hypokalemia 02/02/2011  . Normocytic anemia 02/02/2011  . Essential hypertension   . Hyperlipidemia     Past Surgical History:  Procedure Laterality Date  . APPENDECTOMY    . HEART CHAMBER REVISION     patched hole --[place]  . LAPAROSCOPIC APPENDECTOMY N/A 09/27/2013   Procedure: APPENDECTOMY - CONVERTED FROM LAPAROSCOPIC;  Surgeon: Donnie Mesa, MD;  Location: [facility];  Service: General;  Laterality: N/A;  . RIGHT/LEFT HEART CATH AND CORONARY ANGIOGRAPHY N/A 05/24/2016   Procedure: Right/Left Heart Cath and Coronary Angiography;  Surgeon: Larey Dresser, MD;  Location: [facility];  Service: Cardiovascular;  Laterality: N/A;  . TUBAL LIGATION       OB History   No obstetric history on file.      Home Medications    Prior to Admission medications   Medication Sig Start Date End Date Taking? Authorizing Provider  acetaminophen (TYLENOL) 500 MG tablet Take 500 mg by mouth every 6 (six) hours as needed for pain.    [provider]   amLODipine (NORVASC) 10 MG tablet Take 0.5 tablets (5 mg total) by mouth daily. 02/09/18   Susy Frizzle, MD  calcitRIOL (ROCALTROL) 0.25 MCG capsule Take 1 capsule (0.25 mcg total) by mouth every Monday, Wednesday, and Friday. 02/11/18   Susy Frizzle, MD  carvedilol (COREG) 12.5 MG tablet Take 1 tablet (12.5 mg total) by mouth 2 (two) times daily. 02/09/18 08/15/18  Susy Frizzle, MD  Cholecalciferol (VITAMIN D) 2000 UNITS CAPS Take 2,000 Units by mouth daily.     [provider]  cloNIDine (CATAPRES) 0.3 MG tablet Take 1 tablet (0.3 mg total) by mouth 2 (two) times daily. 02/09/18   Susy Frizzle, MD  docusate sodium (COLACE) 100 MG capsule Take 1 capsule (100 mg total) by mouth 2 (two) times daily. 10/24/17 10/24/18  Barton Dubois, MD  febuxostat (ULORIC) 40 MG tablet Take 1 tablet (40 mg total) by mouth daily. 04/12/18   Susy Frizzle, MD  Fluticasone-Umeclidin-Vilant (TRELEGY ELLIPTA) 100-62.5-25 MCG/INH AEPB Inhale 1 Inhaler into the lungs daily. 02/09/18   Susy Frizzle, MD  furosemide (LASIX) 40 MG tablet Take 1.5 tablets (60 mg total) by mouth daily. Take 60 mg daily 02/09/18 08/15/18  Susy Frizzle, MD  gabapentin (NEURONTIN) 100 MG capsule TAKE 1 TO 2 CAPSULES BY MOUTH AT BEDTIME FOR PERIPHERAL NEUROPATHY. 02/09/18   Susy Frizzle, MD  Insulin Detemir (LEVEMIR FLEXTOUCH) 100 UNIT/ML Pen Inject 20 Units into the skin at bedtime. SUBCUTANEOUSLY ONCE DAILY AT  10  PM 02/09/18   Susy Frizzle, MD  insulin lispro (HUMALOG KWIKPEN) 100 UNIT/ML KwikPen Inject 0.07 mLs (7 Units total) into the skin 3 (three) times daily. 05/27/18   Susy Frizzle, MD  ipratropium-albuterol (DUONEB) 0.5-2.5 (3) MG/3ML SOLN Take 3 mLs by nebulization every 6 (six) hours as needed. 10/29/17   Orlena Sheldon, PA-C  lubiprostone (AMITIZA) 24 MCG capsule Take 1 capsule (24 mcg total) by mouth 2 (two) times daily with a meal. 07/16/18   Susy Frizzle, MD  omeprazole (PRILOSEC) 20 MG  capsule Take 1 capsule (20 mg total) by mouth daily. 02/09/18   Susy Frizzle, MD  ondansetron (ZOFRAN ODT) 4 MG disintegrating tablet Take 1 tablet (4 mg total) by mouth every 8 (eight) hours as needed. 10/05/18   Fredia Sorrow, MD  polyethylene glycol [facility]) packet Take 17 g by mouth daily. Hold for diarrhea. 10/24/17   Barton Dubois, MD  psyllium (HYDROCIL/METAMUCIL) 95 % PACK Take 1 packet by mouth 3 (three) times daily. 02/19/16   Lavina Hamman, MD  Rivaroxaban (XARELTO) 15 MG TABS tablet Take 1 tablet (15 mg total) by mouth daily. 02/09/18   Susy Frizzle, MD  simvastatin (ZOCOR) 20 MG tablet TAKE ONE TABLET (20MG ) BY MOUTH ONCE DAILY AT BEDTIME 02/09/18   Susy Frizzle, MD    Family History Family History  Problem Relation Age of Onset  . Cancer Father   . Diabetes Father   . Cancer Brother  Brain  . Cancer Sister        abdominal fat    Social History Social History   Tobacco Use  . Smoking status: Former Smoker    Packs/day: 0.50    Years: 50.00    Pack years: 25.00    Types: Cigarettes    Quit date: 01/19/2009    Years since quitting: 9.7  . Smokeless tobacco: Never Used  Substance Use Topics  . Alcohol use: No    Alcohol/week: 0.0 standard drinks  . Drug use: No     Allergies   Ace inhibitors, Actos [pioglitazone], Aspirin, Metformin and related, and Eliquis [apixaban]   Review of Systems Review of Systems  Constitutional: Negative for chills and fever.  HENT: Negative for congestion, rhinorrhea and sore throat.   Eyes: Negative for visual disturbance.  Respiratory: Negative for cough and shortness of breath.   Cardiovascular: Negative for chest pain and leg swelling.  Gastrointestinal: Positive for abdominal pain and nausea. Negative for diarrhea and vomiting.  Genitourinary: Negative for dysuria and hematuria.  Musculoskeletal: Negative for back pain and neck pain.  Skin: Negative for rash.  Neurological: Negative for dizziness,  light-headedness and headaches.  Hematological: Does not bruise/bleed easily.  Psychiatric/Behavioral: Negative for confusion.     Physical Exam Updated Vital Signs BP 136/73 (BP Location: Right Arm)   Pulse 89   Temp 98.4 F (36.9 C) (Oral)   Resp 18   Ht 1.6 m (5\' 3" )   Wt 77.6 kg   SpO2 96%   BMI 30.29 kg/m   Physical Exam Vitals signs and nursing note reviewed.  Constitutional:      General: She is not in acute distress.    Appearance: Normal appearance. She is well-developed.  HENT:     Head: Normocephalic and atraumatic.  Eyes:     Extraocular Movements: Extraocular movements intact.     Conjunctiva/sclera: Conjunctivae normal.     Pupils: Pupils are equal, round, and reactive to light.  Neck:     Musculoskeletal: Normal range of motion and neck supple.  Cardiovascular:     Rate and Rhythm: Normal rate and regular rhythm.     Heart sounds: No murmur.  Pulmonary:     Effort: Pulmonary effort is normal. No respiratory distress.     Breath sounds: Normal breath sounds.  Abdominal:     Palpations: Abdomen is soft. There is no mass.     Tenderness: There is no abdominal tenderness.  Musculoskeletal:        General: No swelling.  Skin:    General: Skin is warm and dry.  Neurological:     General: No focal deficit present.     Mental Status: She is alert and oriented to person, place, and time.      ED Treatments / Results  Labs (all labs ordered are listed, but only abnormal results are displayed) Labs Reviewed  COMPREHENSIVE METABOLIC PANEL - Abnormal; Notable for the following components:      Result Value   Glucose, Bld 201 (*)    BUN 27 (*)    Creatinine, Ser 1.53 (*)    GFR calc non Af Amer 32 (*)    GFR calc Af Amer 37 (*)    All other components within normal limits  CBC - Abnormal; Notable for the following components:   RDW 15.7 (*)    All other components within normal limits  LIPASE, BLOOD  URINALYSIS, ROUTINE W REFLEX MICROSCOPIC     EKG None  Radiology Ct Abdomen Pelvis Wo Contrast  Result Date: 10/05/2018 CLINICAL DATA:  Left lower quadrant pain EXAM: CT ABDOMEN AND PELVIS WITHOUT CONTRAST TECHNIQUE: Multidetector CT imaging of the abdomen and pelvis was performed following the standard protocol without IV contrast. COMPARISON:  01/08/2017 FINDINGS: Lower chest: Lung bases demonstrate stable scarring or atelectasis. No acute consolidation or effusion. Mild cardiomegaly. Hepatobiliary: No focal liver abnormality is seen. No gallstones, gallbladder wall thickening, or biliary dilatation. Pancreas: Unremarkable. No pancreatic ductal dilatation or surrounding inflammatory changes. Spleen: Normal in size without focal abnormality. Adrenals/Urinary Tract: Adrenal glands are normal. Nonspecific perinephric fat stranding. Intrarenal vascular calcifications. No hydronephrosis. The bladder is normal Stomach/Bowel: Stomach is nonenlarged. No dilated small bowel. Cecum is at the midline anterior abdomen. Status post appendectomy. Sigmoid colon diverticula without acute inflammatory change. Vascular/Lymphatic: Extensive aortic atherosclerosis. No aneurysm. No significantly enlarged lymph nodes Reproductive: Uterus and bilateral adnexa are unremarkable. Other: Negative for free air or free fluid. Small fat containing umbilical and periumbilical ventral hernias. Supraumbilical small ventral hernia contains anterior wall of transverse colon Musculoskeletal: Degenerative changes. No acute or suspicious osseous abnormality IMPRESSION: 1. No CT evidence for acute intra-abdominal or pelvic abnormality. 2. Mild sigmoid colon diverticula without acute inflammatory change Electronically Signed   By: Donavan Foil M.D.   On: 10/05/2018 22:16    Procedures Procedures (including critical care time)  Medications Ordered in ED Medications  ondansetron (ZOFRAN) injection 4 mg (4 mg Intravenous Not Given 10/05/18 2208)     Initial Impression / Assessment  and Plan / ED Course  I have reviewed the triage vital signs and the nursing notes.  Pertinent labs & imaging results that were available during my care of the patient were reviewed by me and considered in my medical decision making (see chart for details).        Work-up for the left-sided abdominal pain without any significant findings.  No evidence of diverticulitis.  Also the periumbilical hernia has no significant findings.  Will treat patient's nausea have her follow-up with primary care doctor.  Patient is not worried about urinary tract infection.  She does not want to wait for urinalysis.  Will discharge patient home.  Final Clinical Impressions(s) / ED Diagnoses   Final diagnoses:  Left upper quadrant pain    ED Discharge Orders         Ordered    ondansetron (ZOFRAN ODT) 4 MG disintegrating tablet  Every 8 hours PRN     10/05/18 2244           Fredia Sorrow, MD 10/05/18 2246

## 2018-10-05 NOTE — Discharge Instructions (Signed)
Work-up for the abdominal pain without any acute findings.  Follow-up with your regular doctor.  Return for any new or worse symptoms.  Take the Zofran as needed for any nausea.

## 2018-10-11 ENCOUNTER — Other Ambulatory Visit: Payer: Self-pay

## 2018-10-11 ENCOUNTER — Ambulatory Visit (INDEPENDENT_AMBULATORY_CARE_PROVIDER_SITE_OTHER): Payer: Medicare Other | Admitting: Family Medicine

## 2018-10-11 ENCOUNTER — Encounter: Payer: Self-pay | Admitting: Family Medicine

## 2018-10-11 VITALS — BP 128/72 | HR 78 | Temp 97.9°F | Resp 16 | Ht 63.0 in | Wt 169.0 lb

## 2018-10-11 DIAGNOSIS — R1032 Left lower quadrant pain: Secondary | ICD-10-CM

## 2018-10-11 DIAGNOSIS — Z23 Encounter for immunization: Secondary | ICD-10-CM | POA: Diagnosis not present

## 2018-10-11 NOTE — Progress Notes (Signed)
Subjective:    Patient ID: Laura Best, female    DOB: Apr 24, 1940, 78 y.o.   MRN: [id number]  HPI  Patient states that Friday she suddenly developed abdominal pain.  The pain was located on the left side of her abdomen.  This prompted her to go to the emergency room and she was seen in the emergency room on September 12.  CBC at that time was normal.  CMP was normal.  CT scan of the abdomen and pelvis revealed no acute intra-abdominal pathology.  There was no explanation for her left-sided abdominal pain.  Seen the patient and presume that she had diverticulitis.  She presents similar to her presentation in the past however her CT scan was completely normal.  Today she is reporting pain in her left side.  She reports pain and tenderness in her left upper quadrant.  She reports pain and tenderness in her left lower quadrant.  Her abdomen is soft.  It is slightly distended.  She has normal bowel sounds.  She has tenderness and pain on the left lower side.  She denies any melena.  She denies any hematochezia.  She denies any fever.  She denies any nausea or vomiting.  She states that she has not had a bowel movement in 3 to 4 days.  She is going several days in between having a bowel movement.  She does report that her belly feels better after she defecates.  She also reports increasing gas and flatus Past Medical History:  Diagnosis Date  . Arthritis    "hands, feet" (02/17/2016)  . CAD (coronary artery disease)    a. 05/2016: cath showing nonobstructive CAD with 50% prox-Cx stenosis  . CKD (chronic kidney disease) stage 3, GFR 30-59 ml/min (HCC) 12/25/2012  . COPD (chronic obstructive pulmonary disease) ([place])   . Diastolic heart failure ([place])   . Hyperlipidemia   . Hypertension   . Osteoporosis   . Pneumonia    "several times" (02/17/2016)  . Small bowel obstruction ([place]) 02/17/2016  . Tricuspid regurgitation    Echo 05/02/06-Nml LV. Mild MR. Mod TR  . Type II diabetes mellitus ([place])   .  Vitamin D deficiency    Past Surgical History:  Procedure Laterality Date  . APPENDECTOMY    . HEART CHAMBER REVISION     patched hole --[place]  . LAPAROSCOPIC APPENDECTOMY N/A 09/27/2013   Procedure: APPENDECTOMY - CONVERTED FROM LAPAROSCOPIC;  Surgeon: Donnie Mesa, MD;  Location: [facility];  Service: General;  Laterality: N/A;  . RIGHT/LEFT HEART CATH AND CORONARY ANGIOGRAPHY N/A 05/24/2016   Procedure: Right/Left Heart Cath and Coronary Angiography;  Surgeon: Larey Dresser, MD;  Location: [facility];  Service: Cardiovascular;  Laterality: N/A;  . TUBAL LIGATION     Current Outpatient Medications on File Prior to Visit  Medication Sig Dispense Refill  . acetaminophen (TYLENOL) 500 MG tablet Take 500 mg by mouth every 6 (six) hours as needed for pain.    Marland Kitchen amLODipine (NORVASC) 10 MG tablet Take 0.5 tablets (5 mg total) by mouth daily. 45 tablet 3  . calcitRIOL (ROCALTROL) 0.25 MCG capsule Take 1 capsule (0.25 mcg total) by mouth every Monday, Wednesday, and Friday. 90 capsule 1  . Cholecalciferol (VITAMIN D) 2000 UNITS CAPS Take 2,000 Units by mouth daily.     . cloNIDine (CATAPRES) 0.3 MG tablet Take 1 tablet (0.3 mg total) by mouth 2 (two) times daily. 180 tablet 3  . docusate sodium (COLACE)  100 MG capsule Take 1 capsule (100 mg total) by mouth 2 (two) times daily. 60 capsule 1  . febuxostat (ULORIC) 40 MG tablet Take 1 tablet (40 mg total) by mouth daily. 90 tablet 3  . Fluticasone-Umeclidin-Vilant (TRELEGY ELLIPTA) 100-62.5-25 MCG/INH AEPB Inhale 1 Inhaler into the lungs daily. 1 each 11  . furosemide (LASIX) 40 MG tablet Take 1.5 tablets (60 mg total) by mouth daily. Take 60 mg daily 135 tablet 3  . gabapentin (NEURONTIN) 100 MG capsule TAKE 1 TO 2 CAPSULES BY MOUTH AT BEDTIME FOR PERIPHERAL NEUROPATHY. 180 capsule 3  . Insulin Detemir (LEVEMIR FLEXTOUCH) 100 UNIT/ML Pen Inject 20 Units into the skin at bedtime. SUBCUTANEOUSLY ONCE DAILY AT  10  PM 15 pen 3  . insulin lispro  (HUMALOG KWIKPEN) 100 UNIT/ML KwikPen Inject 0.07 mLs (7 Units total) into the skin 3 (three) times daily. 15 pen 2  . ipratropium-albuterol (DUONEB) 0.5-2.5 (3) MG/3ML SOLN Take 3 mLs by nebulization every 6 (six) hours as needed. 360 mL 5  . lubiprostone (AMITIZA) 24 MCG capsule Take 1 capsule (24 mcg total) by mouth 2 (two) times daily with a meal. 60 capsule 3  . omeprazole (PRILOSEC) 20 MG capsule Take 1 capsule (20 mg total) by mouth daily. 90 capsule 3  . ondansetron (ZOFRAN ODT) 4 MG disintegrating tablet Take 1 tablet (4 mg total) by mouth every 8 (eight) hours as needed. 10 tablet 1  . polyethylene glycol (MIRALAX) packet Take 17 g by mouth daily. Hold for diarrhea. 28 each 1  . psyllium (HYDROCIL/METAMUCIL) 95 % PACK Take 1 packet by mouth 3 (three) times daily. 56 each 0  . Rivaroxaban (XARELTO) 15 MG TABS tablet Take 1 tablet (15 mg total) by mouth daily. 90 tablet 3  . simvastatin (ZOCOR) 20 MG tablet TAKE ONE TABLET (20MG ) BY MOUTH ONCE DAILY AT BEDTIME 90 tablet 3  . carvedilol (COREG) 12.5 MG tablet Take 1 tablet (12.5 mg total) by mouth 2 (two) times daily. 180 tablet 3   No current facility-administered medications on file prior to visit.    Allergies  Allergen Reactions  . Ace Inhibitors Other (See Comments)    Hyperkalemia  . Actos [Pioglitazone] Swelling  . Aspirin Other (See Comments)    G.I. Upset. In high doses  . Metformin And Related Diarrhea  . Eliquis [Apixaban] Rash and Other (See Comments)    Patient states that this med was d/ced by prescriber due to interference with allergies and states that she also had a breakout   Social History   Socioeconomic History  . Marital status: Widowed    Spouse name: Not on file  . Number of children: Not on file  . Years of education: Not on file  . Highest education level: Not on file  Occupational History  . Occupation: retired  Scientific laboratory technician  . Financial resource strain: Not on file  . Food insecurity    Worry:  Not on file    Inability: Not on file  . Transportation needs    Medical: Not on file    Non-medical: Not on file  Tobacco Use  . Smoking status: Former Smoker    Packs/day: 0.50    Years: 50.00    Pack years: 25.00    Types: Cigarettes    Quit date: 01/19/2009    Years since quitting: 9.7  . Smokeless tobacco: Never Used  Substance and Sexual Activity  . Alcohol use: No    Alcohol/week: 0.0 standard drinks  .  Drug use: No  . Sexual activity: Never  Lifestyle  . Physical activity    Days per week: Not on file    Minutes per session: Not on file  . Stress: Not on file  Relationships  . Social Herbalist on phone: Not on file    Gets together: Not on file    Attends religious service: Not on file    Active member of club or organization: Not on file    Attends meetings of clubs or organizations: Not on file    Relationship status: Not on file  . Intimate partner violence    Fear of current or ex partner: Not on file    Emotionally abused: Not on file    Physically abused: Not on file    Forced sexual activity: Not on file  Other Topics Concern  . Not on file  Social History Narrative   Entered 10/2013:   She has never driven.   She lives with her son.     Review of Systems  All other systems reviewed and are negative.      Objective:   Physical Exam Vitals signs reviewed.  Constitutional:      Appearance: Normal appearance.  Cardiovascular:     Rate and Rhythm: Normal rate and regular rhythm.     Heart sounds: Normal heart sounds.  Pulmonary:     Effort: Pulmonary effort is normal.     Breath sounds: Normal breath sounds.  Abdominal:     General: Bowel sounds are normal. There is distension.     Palpations: Abdomen is soft.     Tenderness: There is abdominal tenderness.    Neurological:     Mental Status: She is alert.           Assessment & Plan:  Needs flu shot - Plan: Flu Vaccine QUAD High Dose(Fluad)  Abdominal pain, LLQ   This is been a recurrent problem for this patient.  Previously of thought that she may have diverticulitis without a CT scan.  However now that I have a normal CT scan, I question if the patient may have IBS or chronic constipation as a cause of her abdominal pain.  Patient does not appear toxic today.  She is afebrile.  Therefore I am going to try the patient on Linzess 145 mcg p.o. daily and have her recheck with me via telephone next week to see if her symptoms are improving.  If so we may need to continue the patient indefinitely on something for chronic constipation as this has been at least the third time she has developed this pain over the last 12 months.  There is or signs of systemic illness I would recommend treating the patient for diverticulitis.

## 2018-10-14 ENCOUNTER — Telehealth: Payer: Self-pay

## 2018-10-14 NOTE — Telephone Encounter (Signed)
Pt called to report severe diarrhea. Pt states that she was prescribed linzess on Friday 09/18 and she took one that night and one a day as prescribed and has been running to the bathroom since then. Pt also states that she can not keep anything on her stomach from having diarrhea. Please advise.

## 2018-10-14 NOTE — Telephone Encounter (Signed)
Stop linzess.  Diarrhea should stop.  If not could use imodium.

## 2018-10-15 NOTE — Telephone Encounter (Signed)
Pt.notified

## 2018-11-28 ENCOUNTER — Other Ambulatory Visit: Payer: Self-pay | Admitting: Family Medicine

## 2018-11-28 ENCOUNTER — Encounter: Payer: Medicare Other | Admitting: Family Medicine

## 2018-12-02 ENCOUNTER — Other Ambulatory Visit: Payer: Self-pay | Admitting: Family Medicine

## 2018-12-02 DIAGNOSIS — E1142 Type 2 diabetes mellitus with diabetic polyneuropathy: Secondary | ICD-10-CM

## 2019-01-06 ENCOUNTER — Other Ambulatory Visit: Payer: Self-pay | Admitting: Family Medicine

## 2019-01-14 ENCOUNTER — Other Ambulatory Visit: Payer: Self-pay | Admitting: *Deleted

## 2019-01-14 MED ORDER — LUBIPROSTONE 24 MCG PO CAPS: ORAL_CAPSULE | ORAL | 3 refills | Status: DC

## 2019-01-27 ENCOUNTER — Encounter: Payer: Medicare Other | Admitting: Family Medicine

## 2019-03-16 ENCOUNTER — Other Ambulatory Visit: Payer: Self-pay | Admitting: Family Medicine

## 2019-03-17 ENCOUNTER — Ambulatory Visit (INDEPENDENT_AMBULATORY_CARE_PROVIDER_SITE_OTHER): Payer: Medicare Other | Admitting: Family Medicine

## 2019-03-17 ENCOUNTER — Other Ambulatory Visit: Payer: Self-pay

## 2019-03-17 ENCOUNTER — Encounter: Payer: Self-pay | Admitting: Family Medicine

## 2019-03-17 VITALS — BP 110/64 | HR 84 | Temp 97.6°F | Resp 14 | Ht 63.0 in | Wt 175.0 lb

## 2019-03-17 DIAGNOSIS — E118 Type 2 diabetes mellitus with unspecified complications: Secondary | ICD-10-CM | POA: Diagnosis not present

## 2019-03-17 DIAGNOSIS — I482 Chronic atrial fibrillation, unspecified: Secondary | ICD-10-CM | POA: Diagnosis not present

## 2019-03-17 DIAGNOSIS — Z794 Long term (current) use of insulin: Secondary | ICD-10-CM | POA: Diagnosis not present

## 2019-03-17 DIAGNOSIS — N183 Chronic kidney disease, stage 3 unspecified: Secondary | ICD-10-CM | POA: Diagnosis not present

## 2019-03-17 DIAGNOSIS — I5033 Acute on chronic diastolic (congestive) heart failure: Secondary | ICD-10-CM | POA: Diagnosis not present

## 2019-03-17 DIAGNOSIS — Z0000 Encounter for general adult medical examination without abnormal findings: Secondary | ICD-10-CM | POA: Diagnosis not present

## 2019-03-17 DIAGNOSIS — J42 Unspecified chronic bronchitis: Secondary | ICD-10-CM

## 2019-03-17 NOTE — Progress Notes (Signed)
Subjective:    Patient ID: Laura Best, female    DOB: Aug 29, 1940, 79 y.o.   MRN: [id number]  HPI  Patient is here today for complete physical exam.  She has a history of insulin-dependent type 2 diabetes mellitus.  She is currently on Levemir 20 units a day.  I am very proud of the patient as she brings in a sheet of paper with her blood sugars on it.  Her fasting blood sugars per second the morning range between 58 and 158 but the vast majority are between 78 and 127.  Her blood sugars at lunchtime are 130-159.  Her blood sugars prior to dinnertime are 115-170.  Her blood sugars in the evening are 92-161.  I am extremely happy with these values given her age and comorbidities.  She has had 2 isolated episodes of hypoglycemia.  Both occurred in the morning.  The lowest was 58.  The other 1 was 62.  However 99% of her sugars are outstanding in between 101 190.  Therefore overall I am extremely happy with these values.  She denies any falls.  She denies any memory loss.  She is still managing her finances and paying her own bills.  Her son drives for her.  Her son also buys her groceries and checks in on her.  Due to recent Covid pandemic, he has been performing more of her errands for her however she still lives independently and cooks for herself and cleans and provides all of her self-care.  She denies any depression.  She denies any anxiety.  Immunizations are listed below: Immunization History  Administered Date(s) Administered  . Fluad Quad(high Dose 65+) 10/11/2018  . Influenza Whole 11/30/2009  . Influenza, High Dose Seasonal PF 10/23/2017  . Influenza,inj,Quad PF,6+ Mos 12/16/2012, 10/03/2013, 10/05/2014, 10/18/2015, 09/20/2016  . Pneumococcal Conjugate-13 12/25/2012  . Pneumococcal Polysaccharide-23 11/30/2009  . Tdap 12/25/2012   She has not had the COVID-19 vaccine.  I provided her contact information today to schedule via.  Given her age I would not recommend a colonoscopy or Pap  smear.  Diabetic foot exam was performed today and is completely normal.  She does report occasional neuropathy however the gabapentin seems to manage this that she states that the pain seldom bothers her.  Her last colonoscopy was 2010 however given her age and her medical comorbidities I would not recommend a repeat colonoscopy at the present time given the fact she is not having any issues. Past Medical History:  Diagnosis Date  . Arthritis    "hands, feet" (02/17/2016)  . CAD (coronary artery disease)    a. 05/2016: cath showing nonobstructive CAD with 50% prox-Cx stenosis  . CKD (chronic kidney disease) stage 3, GFR 30-59 ml/min 12/25/2012  . COPD (chronic obstructive pulmonary disease) ([place])   . Diastolic heart failure ([place])   . Hyperlipidemia   . Hypertension   . Osteoporosis   . Pneumonia    "several times" (02/17/2016)  . Small bowel obstruction ([place]) 02/17/2016  . Tricuspid regurgitation    Echo 05/02/06-Nml LV. Mild MR. Mod TR  . Type II diabetes mellitus ([place])   . Vitamin D deficiency    Past Surgical History:  Procedure Laterality Date  . APPENDECTOMY    . HEART CHAMBER REVISION     patched hole --[place]  . LAPAROSCOPIC APPENDECTOMY N/A 09/27/2013   Procedure: APPENDECTOMY - CONVERTED FROM LAPAROSCOPIC;  Surgeon: Donnie Mesa, MD;  Location: [facility];  Service: General;  Laterality: N/A;  .  RIGHT/LEFT HEART CATH AND CORONARY ANGIOGRAPHY N/A 05/24/2016   Procedure: Right/Left Heart Cath and Coronary Angiography;  Surgeon: Larey Dresser, MD;  Location: [facility];  Service: Cardiovascular;  Laterality: N/A;  . TUBAL LIGATION     Current Outpatient Medications on File Prior to Visit  Medication Sig Dispense Refill  . acetaminophen (TYLENOL) 500 MG tablet Take 500 mg by mouth every 6 (six) hours as needed for pain.    Marland Kitchen amLODipine (NORVASC) 10 MG tablet TAKE ONE-HALF TABLET BY  MOUTH DAILY 45 tablet 3  . calcitRIOL (ROCALTROL) 0.25 MCG capsule Take 1 capsule (0.25 mcg  total) by mouth every Monday, Wednesday, and Friday. 90 capsule 1  . carvedilol (COREG) 12.5 MG tablet TAKE 1 TABLET BY MOUTH TWO  TIMES DAILY 180 tablet 3  . Cholecalciferol (VITAMIN D) 2000 UNITS CAPS Take 2,000 Units by mouth daily.     . cloNIDine (CATAPRES) 0.3 MG tablet TAKE 1 TABLET BY MOUTH TWO  TIMES DAILY 180 tablet 3  . febuxostat (ULORIC) 40 MG tablet TAKE 1 TABLET BY MOUTH  DAILY 90 tablet 3  . Fluticasone-Umeclidin-Vilant (TRELEGY ELLIPTA) 100-62.5-25 MCG/INH AEPB Inhale 1 Inhaler into the lungs daily. 1 each 11  . furosemide (LASIX) 40 MG tablet TAKE 1 AND 1/2 TABLETS  (60MG ) BY MOUTH DAILY 135 tablet 3  . gabapentin (NEURONTIN) 100 MG capsule TAKE 1 TO 2 CAPSULES BY  MOUTH AT BEDTIME FOR  PERIPHERAL NEUROPATHY. 180 capsule 3  . Insulin Detemir (LEVEMIR FLEXTOUCH) 100 UNIT/ML Pen Inject 20 Units into the skin at bedtime. SUBCUTANEOUSLY ONCE DAILY AT  10  PM 15 pen 3  . ipratropium-albuterol (DUONEB) 0.5-2.5 (3) MG/3ML SOLN Take 3 mLs by nebulization every 6 (six) hours as needed. 360 mL 5  . lubiprostone (AMITIZA) 24 MCG capsule TAKE 1 CAPSULE BY MOUTH TWICE DAILY WITH A MEAL 180 capsule 3  . omeprazole (PRILOSEC) 20 MG capsule TAKE 1 CAPSULE BY MOUTH  DAILY 90 capsule 3  . ondansetron (ZOFRAN ODT) 4 MG disintegrating tablet Take 1 tablet (4 mg total) by mouth every 8 (eight) hours as needed. 10 tablet 1  . polyethylene glycol (MIRALAX) packet Take 17 g by mouth daily. Hold for diarrhea. 28 each 1  . psyllium (HYDROCIL/METAMUCIL) 95 % PACK Take 1 packet by mouth 3 (three) times daily. 56 each 0  . simvastatin (ZOCOR) 20 MG tablet TAKE 1 TABLET BY MOUTH ONCE DAILY AT BEDTIME 90 tablet 3  . XARELTO 15 MG TABS tablet TAKE 1 TABLET BY MOUTH  DAILY 90 tablet 3  . ACCU-CHEK AVIVA PLUS test strip CHECK BLOOD SUGAR 3 TIMES  DAILY BEFORE MEALS AND AT  BEDTIME. 400 strip 3  . Accu-Chek Softclix Lancets lancets CHECK BLOOD SUGAR 3 TIMES  DAILY BEFORE MEALS AND AT  BEDTIME 400 each 3  . HUMALOG  KWIKPEN 100 UNIT/ML KwikPen INJECT 0.07 MLS (7 UNITS  TOTAL) INTO THE SKIN 3  (THREE) TIMES DAILY. 30 mL 3   No current facility-administered medications on file prior to visit.   Allergies  Allergen Reactions  . Ace Inhibitors Other (See Comments)    Hyperkalemia  . Actos [Pioglitazone] Swelling  . Aspirin Other (See Comments)    G.I. Upset. In high doses  . Metformin And Related Diarrhea  . Eliquis [Apixaban] Rash and Other (See Comments)    Patient states that this med was d/ced by prescriber due to interference with allergies and states that she also had a breakout   Social  History   Socioeconomic History  . Marital status: Widowed    Spouse name: Not on file  . Number of children: Not on file  . Years of education: Not on file  . Highest education level: Not on file  Occupational History  . Occupation: retired  Tobacco Use  . Smoking status: Former Smoker    Packs/day: 0.50    Years: 50.00    Pack years: 25.00    Types: Cigarettes    Quit date: 01/19/2009    Years since quitting: 10.1  . Smokeless tobacco: Never Used  Substance and Sexual Activity  . Alcohol use: No    Alcohol/week: 0.0 standard drinks  . Drug use: No  . Sexual activity: Never  Other Topics Concern  . Not on file  Social History Narrative   Entered 10/2013:   She has never driven.   She lives with her son.   Social Determinants of Health   Financial Resource Strain:   . Difficulty of Paying Living Expenses: Not on file  Food Insecurity:   . Worried About Charity fundraiser in the Last Year: Not on file  . Ran Out of Food in the Last Year: Not on file  Transportation Needs:   . Lack of Transportation (Medical): Not on file  . Lack of Transportation (Non-Medical): Not on file  Physical Activity:   . Days of Exercise per Week: Not on file  . Minutes of Exercise per Session: Not on file  Stress:   . Feeling of Stress : Not on file  Social Connections:   . Frequency of Communication with  Friends and Family: Not on file  . Frequency of Social Gatherings with Friends and Family: Not on file  . Attends Religious Services: Not on file  . Active Member of Clubs or Organizations: Not on file  . Attends Archivist Meetings: Not on file  . Marital Status: Not on file  Intimate Partner Violence:   . Fear of Current or Ex-Partner: Not on file  . Emotionally Abused: Not on file  . Physically Abused: Not on file  . Sexually Abused: Not on file      Review of Systems  All other systems reviewed and are negative.      Objective:   Physical Exam Vitals reviewed.  Constitutional:      General: She is not in acute distress.    Appearance: Normal appearance. She is well-developed and normal weight. She is not ill-appearing, toxic-appearing or diaphoretic.  HENT:     Head: Normocephalic and atraumatic.     Right Ear: Tympanic membrane and ear canal normal.     Left Ear: Tympanic membrane and ear canal normal.     Nose: Nose normal. No congestion or rhinorrhea.     Mouth/Throat:     Mouth: Mucous membranes are moist.     Pharynx: Oropharynx is clear. No oropharyngeal exudate or posterior oropharyngeal erythema.  Eyes:     General:        Right eye: No discharge.        Left eye: No discharge.     Extraocular Movements: Extraocular movements intact.     Conjunctiva/sclera: Conjunctivae normal.     Pupils: Pupils are equal, round, and reactive to light.  Neck:     Vascular: No JVD.  Cardiovascular:     Rate and Rhythm: Normal rate. Rhythm irregular.     Heart sounds: Normal heart sounds. No murmur.  Pulmonary:  Effort: Pulmonary effort is normal. No respiratory distress.     Breath sounds: No wheezing, rhonchi or rales.  Abdominal:     General: Bowel sounds are normal. There is no distension.     Palpations: Abdomen is soft. There is no mass.     Tenderness: There is no abdominal tenderness. There is no right CVA tenderness, left CVA tenderness, guarding or  rebound.     Hernia: No hernia is present.  Musculoskeletal:        General: No swelling, tenderness, deformity or signs of injury.     Cervical back: Neck supple.     Right lower leg: No edema.     Left lower leg: No edema.  Lymphadenopathy:     Cervical: No cervical adenopathy.  Skin:    General: Skin is warm.     Coloration: Skin is not jaundiced.     Findings: No bruising, erythema, lesion or rash.  Neurological:     General: No focal deficit present.     Mental Status: She is alert and oriented to person, place, and time. Mental status is at baseline.     Cranial Nerves: No cranial nerve deficit.     Sensory: No sensory deficit.     Motor: No weakness.     Coordination: Coordination normal.     Gait: Gait normal.     Deep Tendon Reflexes: Reflexes normal.  Psychiatric:        Mood and Affect: Mood normal.        Behavior: Behavior normal.        Thought Content: Thought content normal.        Judgment: Judgment normal.           Assessment & Plan:  Controlled type 2 diabetes mellitus with complication, with long-term current use of insulin (HCC) - Plan: Hemoglobin A1c, Lipid panel, Microalbumin, urine, CBC with Differential/Platelet, COMPLETE METABOLIC PANEL WITH GFR  Diastolic CHF, acute on chronic (HCC)  Atrial fibrillation, chronic (HCC)  General medical exam  Stage 3 chronic kidney disease, unspecified whether stage 3a or 3b CKD  Chronic bronchitis, unspecified chronic bronchitis type ([place])  I am extremely happy with her reported blood sugars.  These are outstanding.  I will check a hemoglobin A1c.  If her hemoglobin A1c is less than 7.5 I will make no additional changes.  Her blood pressure today is outstanding at 110/64.  She has a history of paroxysmal atrial fibrillation.  She is appropriately anticoagulated with Xarelto and her heart rate is adequately controlled today.  Therefore I will make no changes in her medication.  I will monitor her chronic kidney  disease by checking a CMP.  Today she appears euvolemic.  She does have a history of diastolic CHF but there appears to be no evidence of fluid overload.  Her COPD appears to be well managed with Trelegy.  She denies any shortness of breath or dyspnea on exertion.  I will check a fasting lipid panel.  Ideally I like her LDL cholesterol to be well below 100.  Her immunizations are up-to-date except for the COVID-19 vaccine which I recommended.  Her colonoscopy is due however given her age, I do not believe that she would benefit from this at the present time given her multiple medical comorbidities and therefore I have recommended against that.  She does not require Pap smear.  She is overdue for mammogram however again given her multiple cardiac issues, diabetes, and frail condition I do not  believe that she would benefit from breast cancer screening at this point in her life.  She denies any memory loss.  She denies any falls.  She denies any depression.

## 2019-03-18 LAB — CBC WITH DIFFERENTIAL/PLATELET
Absolute Monocytes: 680 cells/uL (ref 200–950)
Basophils Absolute: 60 cells/uL (ref 0–200)
Basophils Relative: 1.2 %
Eosinophils Absolute: 120 cells/uL (ref 15–500)
Eosinophils Relative: 2.4 %
HCT: 36.7 % (ref 35.0–45.0)
Hemoglobin: 11.8 g/dL (ref 11.7–15.5)
Lymphs Abs: 1200 cells/uL (ref 850–3900)
MCH: 28.9 pg (ref 27.0–33.0)
MCHC: 32.2 g/dL (ref 32.0–36.0)
MCV: 89.7 fL (ref 80.0–100.0)
MPV: 11.4 fL (ref 7.5–12.5)
Monocytes Relative: 13.6 %
Neutro Abs: 2940 cells/uL (ref 1500–7800)
Neutrophils Relative %: 58.8 %
Platelets: 215 10*3/uL (ref 140–400)
RBC: 4.09 10*6/uL (ref 3.80–5.10)
RDW: 13.6 % (ref 11.0–15.0)
Total Lymphocyte: 24 %
WBC: 5 10*3/uL (ref 3.8–10.8)

## 2019-03-18 LAB — COMPLETE METABOLIC PANEL WITH GFR
AG Ratio: 1.5 (calc) (ref 1.0–2.5)
ALT: 7 U/L (ref 6–29)
AST: 8 U/L — ABNORMAL LOW (ref 10–35)
Albumin: 3.8 g/dL (ref 3.6–5.1)
Alkaline phosphatase (APISO): 88 U/L (ref 37–153)
BUN/Creatinine Ratio: 19 (calc) (ref 6–22)
BUN: 35 mg/dL — ABNORMAL HIGH (ref 7–25)
CO2: 29 mmol/L (ref 20–32)
Calcium: 9 mg/dL (ref 8.6–10.4)
Chloride: 104 mmol/L (ref 98–110)
Creat: 1.84 mg/dL — ABNORMAL HIGH (ref 0.60–0.93)
GFR, Est African American: 30 mL/min/{1.73_m2} — ABNORMAL LOW (ref >=60)
GFR, Est Non African American: 26 mL/min/{1.73_m2} — ABNORMAL LOW (ref >=60)
Globulin: 2.6 g/dL (calc) (ref 1.9–3.7)
Glucose, Bld: 197 mg/dL — ABNORMAL HIGH (ref 65–99)
Potassium: 4.4 mmol/L (ref 3.5–5.3)
Sodium: 141 mmol/L (ref 135–146)
Total Bilirubin: 0.5 mg/dL (ref 0.2–1.2)
Total Protein: 6.4 g/dL (ref 6.1–8.1)

## 2019-03-18 LAB — HEMOGLOBIN A1C
Hgb A1c MFr Bld: 7.7 % of total Hgb — ABNORMAL HIGH (ref <5.7)
Mean Plasma Glucose: 174 (calc)
eAG (mmol/L): 9.7 (calc)

## 2019-03-18 LAB — LIPID PANEL
Cholesterol: 140 mg/dL (ref <200)
HDL: 55 mg/dL (ref >=50)
LDL Cholesterol (Calc): 63 mg/dL (calc)
Non-HDL Cholesterol (Calc): 85 mg/dL (calc) (ref <130)
Total CHOL/HDL Ratio: 2.5 (calc) (ref <5.0)
Triglycerides: 135 mg/dL (ref <150)

## 2019-03-18 LAB — MICROALBUMIN, URINE: Microalb, Ur: 4.1 mg/dL

## 2019-04-15 IMAGING — CT CT ANGIO CHEST
2 of 8 series · 18 of 46 positions shown · IV contrast (OMNI)
Comparison: Chest x-ray obtained earlier today

CLINICAL DATA: 76-year-old female with shortness of breath and
elevated D-dimer

EXAM:
CT ANGIOGRAPHY CHEST WITH CONTRAST
TECHNIQUE: Multidetector CT imaging of the chest was performed using the
standard protocol during bolus administration of intravenous
contrast. Multiplanar CT image reconstructions and MIPs were
obtained to evaluate the vascular anatomy.
CONTRAST:  50mL [id number] IOPAMIDOL ([id number]) INJECTION 76%

[Series 6: thins · axial · 0.76mm/px · z∈[+1142,+1437]mm · 15 of 325 slices shown]
[im 15/325  lung]
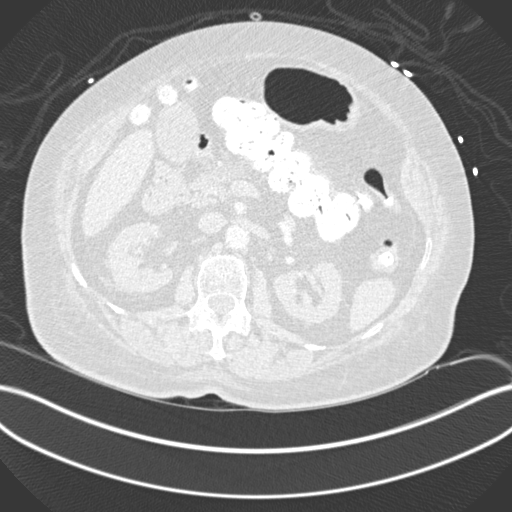
[im 45/325  soft-tissue]
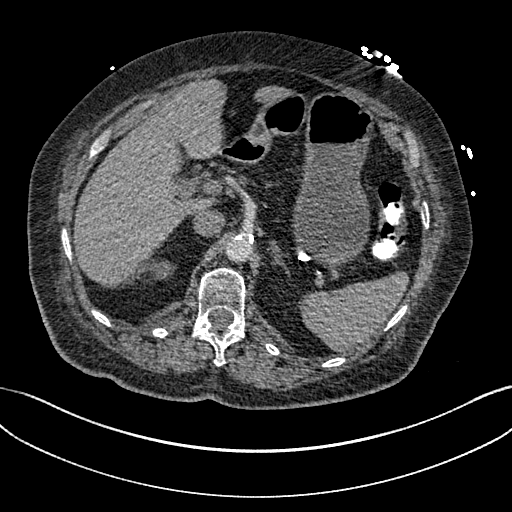
[im 59/325  lung]
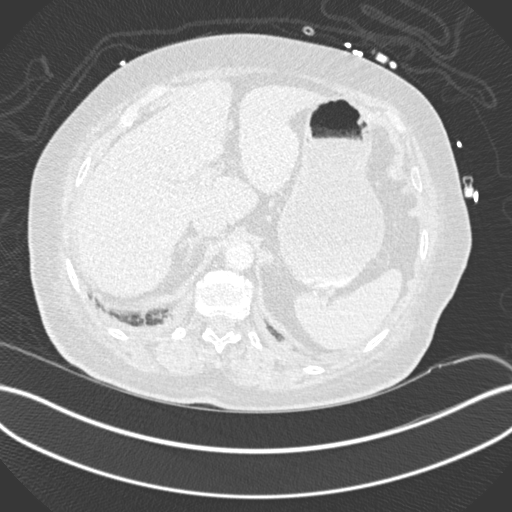
[im 74/325  soft-tissue]
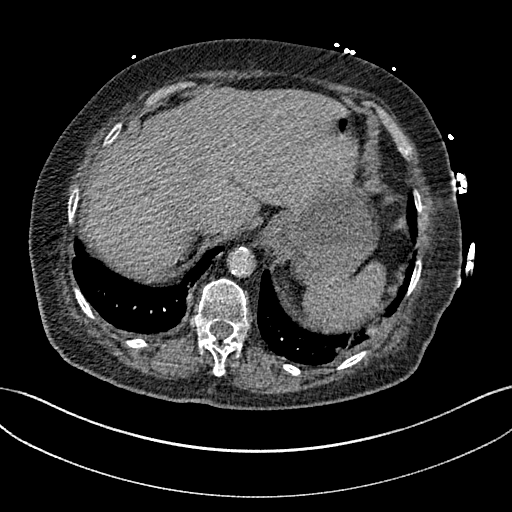
[im 104/325  lung]
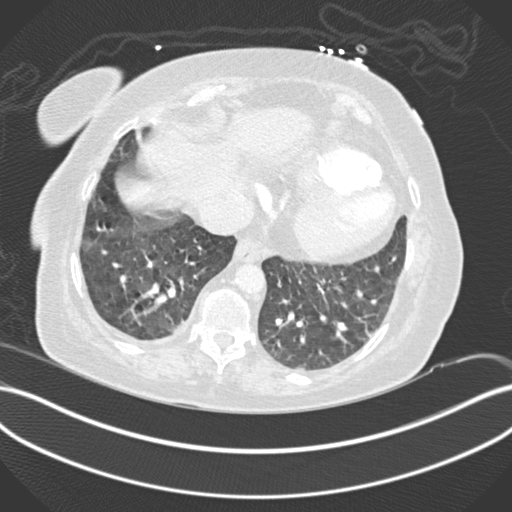
[im 118/325  soft-tissue]
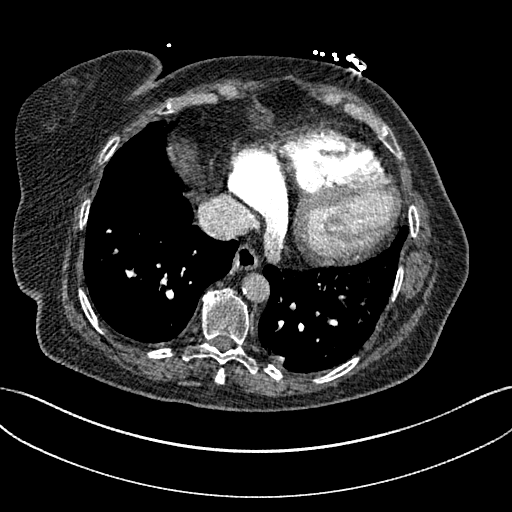
[im 148/325  lung]
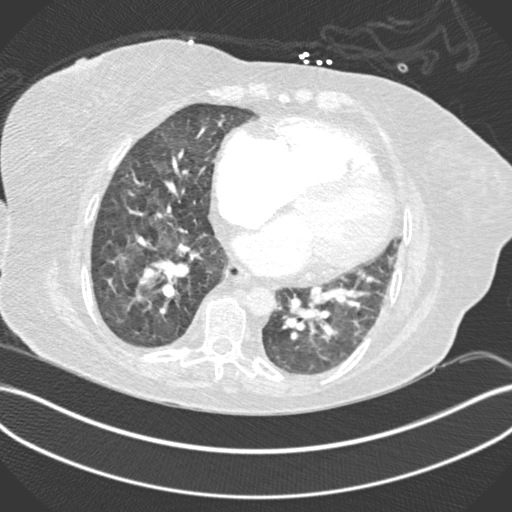
[im 163/325  soft-tissue]
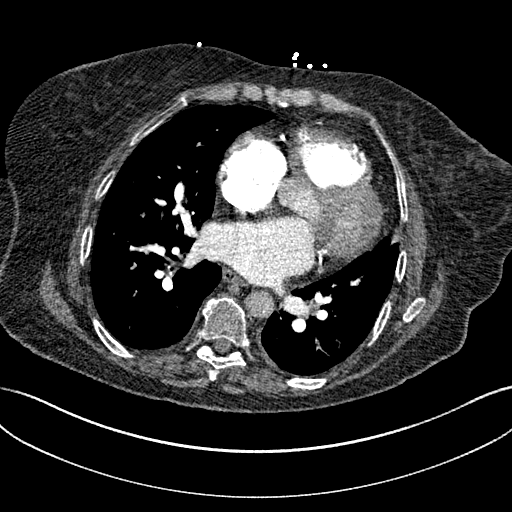
[im 177/325  lung]
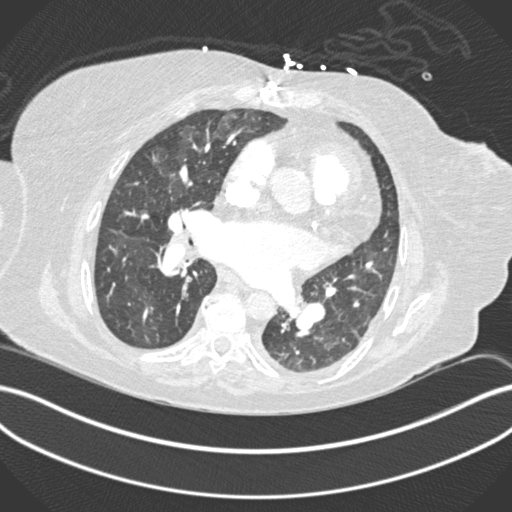
[im 207/325  soft-tissue]
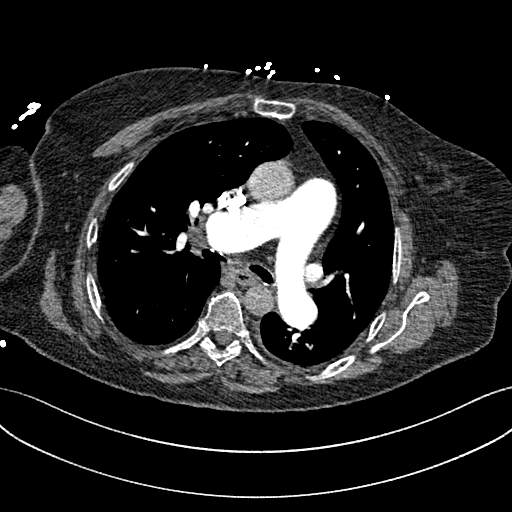
[im 221/325  lung]
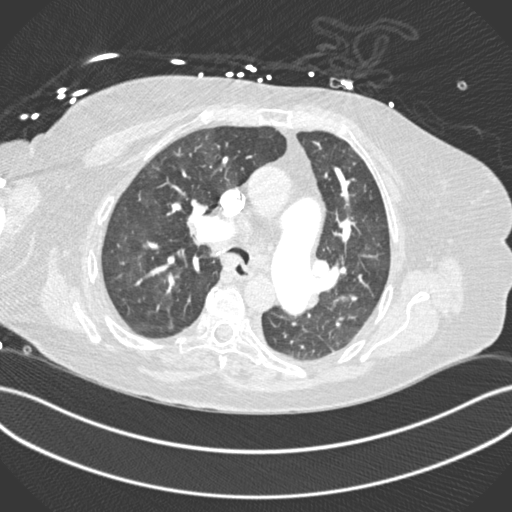
[im 251/325  soft-tissue]
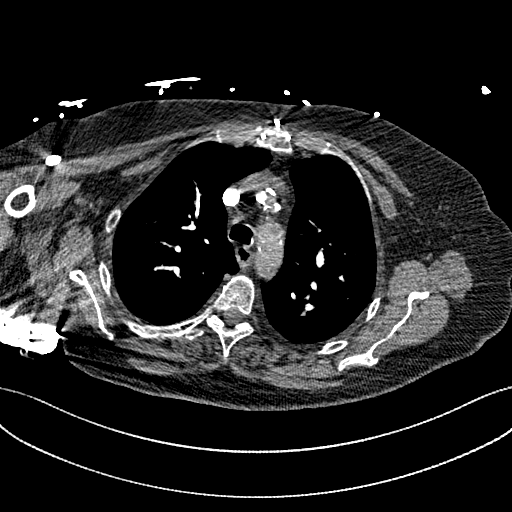
[im 266/325  lung]
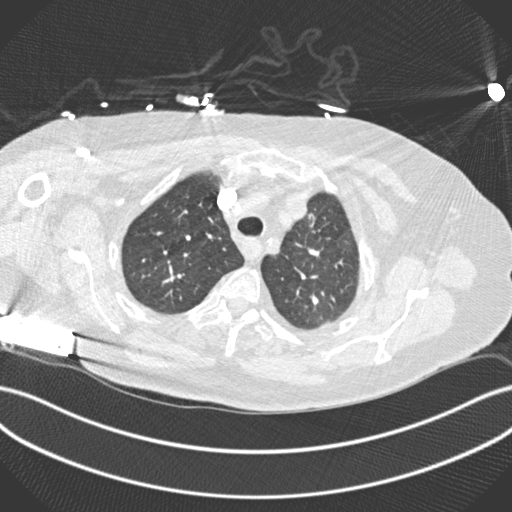
[im 280/325  soft-tissue]
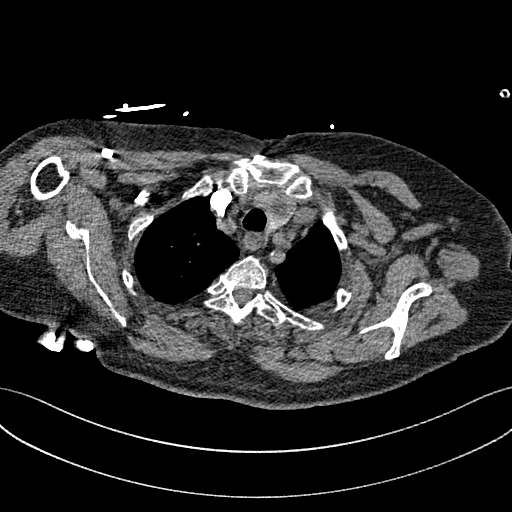
[im 310/325  lung]
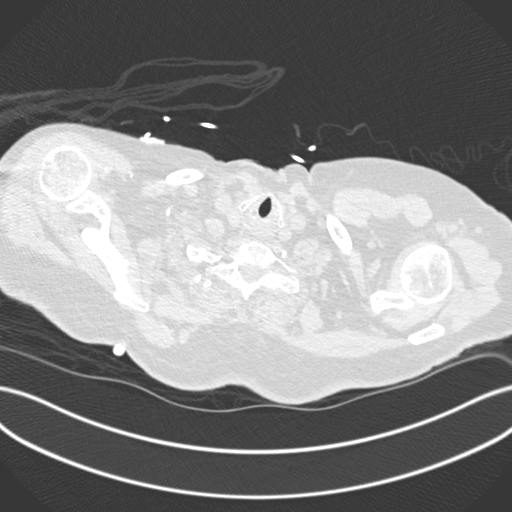

[Series 8: coronal mpr · coronal · 0.63mm/px · 3 of 151 slices shown]
[im 38/151  soft-tissue]
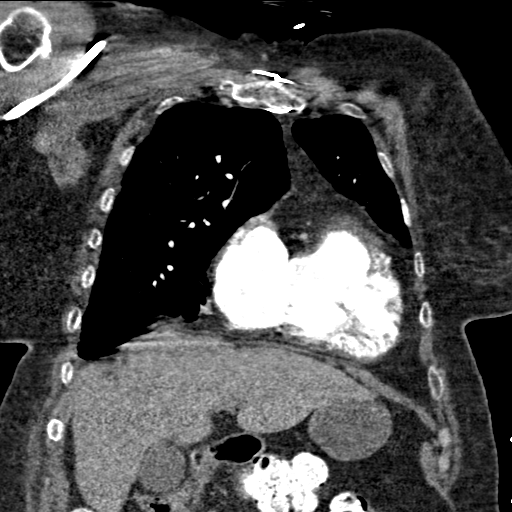
[im 76/151  soft-tissue]
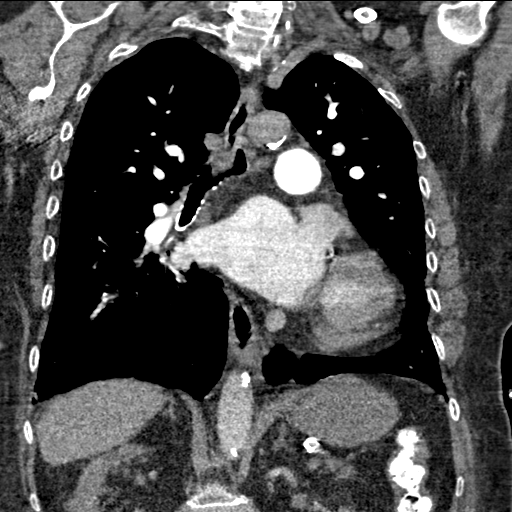
[im 113/151  soft-tissue]
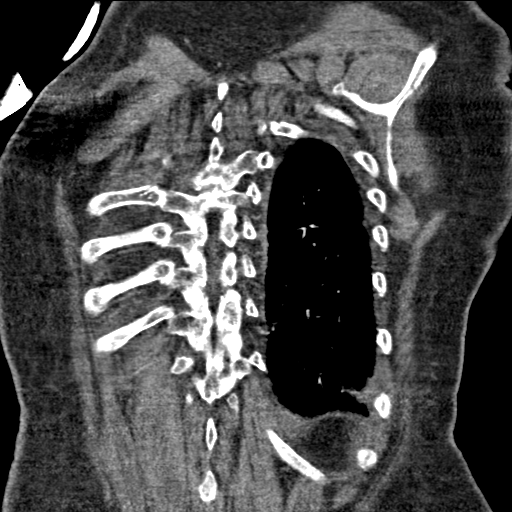

[18 of 46 positions shown; findings below may reference images not displayed]

FINDINGS: Cardiovascular: Adequate opacification of the pulmonary arteries to
the proximal subsegmental level. There is no evidence of acute
pulmonary embolus. Cardiomegaly with right-sided heart enlargement.
Atherosclerotic calcifications present throughout the coronary
arteries. Normal caliber thoracic aorta. Atherosclerotic
calcifications present in the aorta. No pericardial effusion.

Mediastinum/Nodes: 2.3 cm nodule in the inferior aspect of the left
thyroid gland. No suspicious mediastinal lymphadenopathy or
mediastinal mass. Tiny hiatal hernia.

Lungs/Pleura: No focal airspace consolidation. There are scattered
areas of subtle ground-glass attenuation airspace opacity in a
peribronchovascular distribution in the right upper, middle and
lower lobes. Trace dependent atelectasis. No suspicious pulmonary
mass or nodule.

Upper Abdomen: Visualized upper abdominal organs are unremarkable.

Musculoskeletal: No acute fracture or aggressive appearing lytic or
blastic osseous lesion. Healed median sternotomy.

Review of the MIP images confirms the above findings.
IMPRESSION: 1. Negative for acute pulmonary embolus.
2. Nonspecific subtle multifocal patches of ground-glass attenuation
airspace opacity throughout the right lung. Findings appear to be in
a peribronchovascular distribution. This is favored to reflect an
atypical or viral upper respiratory infection. Additional
considerations include early pneumonitis/ alveolitis, and
potentially very early asymmetric pulmonary edema.
3. Incidentally noted 2.3 cm left inferior thyroid nodule. Recommend
further evaluation with non urgent thyroid ultrasound.
4. Cardiomegaly with right-sided heart enlargement.
5. Coronary artery calcifications.

Aortic Atherosclerosis ([id number]-299.9).

## 2019-04-29 ENCOUNTER — Encounter: Payer: Self-pay | Admitting: Cardiology

## 2019-05-09 ENCOUNTER — Encounter: Payer: Self-pay | Admitting: Family Medicine

## 2019-05-09 ENCOUNTER — Ambulatory Visit (INDEPENDENT_AMBULATORY_CARE_PROVIDER_SITE_OTHER): Payer: Medicare Other | Admitting: Family Medicine

## 2019-05-09 ENCOUNTER — Other Ambulatory Visit: Payer: Self-pay

## 2019-05-09 VITALS — BP 166/80 | HR 98 | Temp 97.4°F | Resp 16 | Ht 63.0 in | Wt 174.0 lb

## 2019-05-09 DIAGNOSIS — R140 Abdominal distension (gaseous): Secondary | ICD-10-CM | POA: Diagnosis not present

## 2019-05-09 MED ORDER — DICYCLOMINE HCL 20 MG PO TABS: 20.0000 mg | ORAL_TABLET | Freq: Four times a day (QID) | ORAL | 0 refills | Status: AC | PRN

## 2019-05-09 MED ORDER — FLUTICASONE PROPIONATE 50 MCG/ACT NA SUSP: 2.0000 | Freq: Every day | NASAL | 6 refills | Status: DC

## 2019-05-09 NOTE — Progress Notes (Signed)
Subjective:    Patient ID: Laura Best, female    DOB: May 28, 1940, 79 y.o.   MRN: [id number]  HPI Patient has a history of episodic abdominal pain usually due to constipation.  However she is taking Dulcolax and is having bowel movements every day.  She states that recently over the last 2 weeks she has had episodic abdominal pain.  This is usually in the center of her stomach.  It occurs 1 to 2 hours after she eats.  The pain is mild and nagging in nature.  Can be crampy.  It does not radiate into the right upper quadrant.  It is not radiating to the left lower quadrant.  There are no fevers or chills.  There is no nausea or vomiting or diarrhea.  There is no melena or hematochezia.  The pain resolves after an hour or so.  She also reports increased flatus and gas and bloating.  She denies any nausea or vomiting.  She denies any dysuria.  She had a CT scan that showed diverticulosis in September 2020 but was otherwise normal.  She still has her gallbladder.  She no longer has her appendix.  Patient has had episodic abdominal pain frequently in the past.  Majority of time is associated with bloating, cramps, and constipation Past Medical History:  Diagnosis Date  . Arthritis    "hands, feet" (02/17/2016)  . CAD (coronary artery disease)    a. 05/2016: cath showing nonobstructive CAD with 50% prox-Cx stenosis  . CKD (chronic kidney disease) stage 3, GFR 30-59 ml/min 12/25/2012  . COPD (chronic obstructive pulmonary disease) ([place])   . Diastolic heart failure ([place])   . Hyperlipidemia   . Hypertension   . Osteoporosis   . Pneumonia    "several times" (02/17/2016)  . Small bowel obstruction ([place]) 02/17/2016  . Tricuspid regurgitation    Echo 05/02/06-Nml LV. Mild MR. Mod TR  . Type II diabetes mellitus ([place])   . Vitamin D deficiency    Past Surgical History:  Procedure Laterality Date  . APPENDECTOMY    . HEART CHAMBER REVISION     patched hole --[place]  . LAPAROSCOPIC APPENDECTOMY N/A  09/27/2013   Procedure: APPENDECTOMY - CONVERTED FROM LAPAROSCOPIC;  Surgeon: Donnie Mesa, MD;  Location: [facility];  Service: General;  Laterality: N/A;  . RIGHT/LEFT HEART CATH AND CORONARY ANGIOGRAPHY N/A 05/24/2016   Procedure: Right/Left Heart Cath and Coronary Angiography;  Surgeon: Larey Dresser, MD;  Location: [facility];  Service: Cardiovascular;  Laterality: N/A;  . TUBAL LIGATION      Current Outpatient Medications on File Prior to Visit  Medication Sig Dispense Refill  . ACCU-CHEK AVIVA PLUS test strip CHECK BLOOD SUGAR 3 TIMES  DAILY BEFORE MEALS AND AT  BEDTIME. 400 strip 3  . Accu-Chek Softclix Lancets lancets CHECK BLOOD SUGAR 3 TIMES  DAILY BEFORE MEALS AND AT  BEDTIME 400 each 3  . acetaminophen (TYLENOL) 500 MG tablet Take 500 mg by mouth every 6 (six) hours as needed for pain.    Marland Kitchen amLODipine (NORVASC) 10 MG tablet TAKE ONE-HALF TABLET BY  MOUTH DAILY 45 tablet 3  . calcitRIOL (ROCALTROL) 0.25 MCG capsule Take 1 capsule (0.25 mcg total) by mouth every Monday, Wednesday, and Friday. 90 capsule 1  . carvedilol (COREG) 12.5 MG tablet TAKE 1 TABLET BY MOUTH TWO  TIMES DAILY 180 tablet 3  . Cholecalciferol (VITAMIN D) 2000 UNITS CAPS Take 2,000 Units by mouth daily.     Marland Kitchen  cloNIDine (CATAPRES) 0.3 MG tablet TAKE 1 TABLET BY MOUTH TWO  TIMES DAILY 180 tablet 3  . febuxostat (ULORIC) 40 MG tablet TAKE 1 TABLET BY MOUTH  DAILY 90 tablet 3  . Fluticasone-Umeclidin-Vilant (TRELEGY ELLIPTA) 100-62.5-25 MCG/INH AEPB Inhale 1 Inhaler into the lungs daily. 1 each 11  . furosemide (LASIX) 40 MG tablet TAKE 1 AND 1/2 TABLETS  (60MG ) BY MOUTH DAILY 135 tablet 3  . gabapentin (NEURONTIN) 100 MG capsule TAKE 1 TO 2 CAPSULES BY  MOUTH AT BEDTIME FOR  PERIPHERAL NEUROPATHY. 180 capsule 3  . HUMALOG KWIKPEN 100 UNIT/ML KwikPen INJECT 0.07 MLS (7 UNITS  TOTAL) INTO THE SKIN 3  (THREE) TIMES DAILY. 30 mL 3  . Insulin Detemir (LEVEMIR FLEXTOUCH) 100 UNIT/ML Pen Inject 20 Units into the skin at  bedtime. SUBCUTANEOUSLY ONCE DAILY AT  10  PM 15 pen 3  . ipratropium-albuterol (DUONEB) 0.5-2.5 (3) MG/3ML SOLN Take 3 mLs by nebulization every 6 (six) hours as needed. 360 mL 5  . lubiprostone (AMITIZA) 24 MCG capsule TAKE 1 CAPSULE BY MOUTH TWICE DAILY WITH A MEAL 180 capsule 3  . omeprazole (PRILOSEC) 20 MG capsule TAKE 1 CAPSULE BY MOUTH  DAILY 90 capsule 3  . ondansetron (ZOFRAN ODT) 4 MG disintegrating tablet Take 1 tablet (4 mg total) by mouth every 8 (eight) hours as needed. 10 tablet 1  . polyethylene glycol (MIRALAX) packet Take 17 g by mouth daily. Hold for diarrhea. 28 each 1  . psyllium (HYDROCIL/METAMUCIL) 95 % PACK Take 1 packet by mouth 3 (three) times daily. 56 each 0  . simvastatin (ZOCOR) 20 MG tablet TAKE 1 TABLET BY MOUTH ONCE DAILY AT BEDTIME 90 tablet 3  . XARELTO 15 MG TABS tablet TAKE 1 TABLET BY MOUTH  DAILY 90 tablet 3   No current facility-administered medications on file prior to visit.   Allergies  Allergen Reactions  . Ace Inhibitors Other (See Comments)    Hyperkalemia  . Actos [Pioglitazone] Swelling  . Aspirin Other (See Comments)    G.I. Upset. In high doses  . Metformin And Related Diarrhea  . Eliquis [Apixaban] Rash and Other (See Comments)    Patient states that this med was d/ced by prescriber due to interference with allergies and states that she also had a breakout   Social History   Socioeconomic History  . Marital status: Widowed    Spouse name: Not on file  . Number of children: Not on file  . Years of education: Not on file  . Highest education level: Not on file  Occupational History  . Occupation: retired  Tobacco Use  . Smoking status: Former Smoker    Packs/day: 0.50    Years: 50.00    Pack years: 25.00    Types: Cigarettes    Quit date: 01/19/2009    Years since quitting: 10.3  . Smokeless tobacco: Never Used  Substance and Sexual Activity  . Alcohol use: No    Alcohol/week: 0.0 standard drinks  . Drug use: No  . Sexual  activity: Never  Other Topics Concern  . Not on file  Social History Narrative   Entered 10/2013:   She has never driven.   She lives with her son.   Social Determinants of Health   Financial Resource Strain:   . Difficulty of Paying Living Expenses:   Food Insecurity:   . Worried About Charity fundraiser in the Last Year:   . [place] in the Last  Year:   Transportation Needs:   . Film/video editor (Medical):   Marland Kitchen Lack of Transportation (Non-Medical):   Physical Activity:   . Days of Exercise per Week:   . Minutes of Exercise per Session:   Stress:   . Feeling of Stress :   Social Connections:   . Frequency of Communication with Friends and Family:   . Frequency of Social Gatherings with Friends and Family:   . Attends Religious Services:   . Active Member of Clubs or Organizations:   . Attends Archivist Meetings:   Marland Kitchen Marital Status:   Intimate Partner Violence:   . Fear of Current or Ex-Partner:   . Emotionally Abused:   Marland Kitchen Physically Abused:   . Sexually Abused:      Review of Systems  All other systems reviewed and are negative.      Objective:   Physical Exam Constitutional:      General: She is not in acute distress.    Appearance: Normal appearance. She is obese. She is not ill-appearing, toxic-appearing or diaphoretic.  Cardiovascular:     Rate and Rhythm: Normal rate and regular rhythm.     Heart sounds: Normal heart sounds.  Pulmonary:     Effort: Pulmonary effort is normal. No respiratory distress.     Breath sounds: Normal breath sounds. No stridor. No wheezing, rhonchi or rales.  Chest:     Chest wall: No tenderness.  Abdominal:     General: Abdomen is flat. Bowel sounds are normal. There is no distension.     Palpations: Abdomen is soft.     Tenderness: There is no abdominal tenderness. There is no guarding or rebound.  Neurological:     Mental Status: She is alert.           Assessment & Plan:   Bloating  Patient reports bloating as well as mid abdominal pain hours after eating.  Differential diagnosis suggest IBS versus constipation versus biliary colic which I believe is less likely.  Her abdominal exam today is completely normal.  I suspect IBS.  She is not constipated per her report.  Therefore I will try Bentyl 20 mg every 6 hours as needed over the weekend.  If no better by Monday, I would recommend a right upper quadrant ultrasound to evaluate for any evidence of gallstones.  If over the weekend worsen she needs to seek medical attention.  However her exam today is completely benign.  For her allergies I recommended Flonase.

## 2019-05-12 ENCOUNTER — Telehealth: Payer: Self-pay | Admitting: Family Medicine

## 2019-05-12 NOTE — Telephone Encounter (Signed)
Was told to call the office let Dr.Pickard know she is doing just fine now.

## 2019-05-22 ENCOUNTER — Other Ambulatory Visit: Payer: Self-pay | Admitting: Family Medicine

## 2019-05-28 ENCOUNTER — Other Ambulatory Visit: Payer: Self-pay | Admitting: Gastroenterology

## 2019-05-28 ENCOUNTER — Ambulatory Visit
Admission: RE | Admit: 2019-05-28 | Discharge: 2019-05-28 | Disposition: A | Discharge status: Home / Self Care | Payer: Medicare Other | Source: Ambulatory Visit | Attending: Gastroenterology | Admitting: Gastroenterology

## 2019-05-28 DIAGNOSIS — R140 Abdominal distension (gaseous): Secondary | ICD-10-CM

## 2019-05-28 DIAGNOSIS — K5900 Constipation, unspecified: Secondary | ICD-10-CM | POA: Diagnosis not present

## 2019-05-28 DIAGNOSIS — D500 Iron deficiency anemia secondary to blood loss (chronic): Secondary | ICD-10-CM | POA: Diagnosis not present

## 2019-06-02 ENCOUNTER — Telehealth: Payer: Self-pay | Admitting: Family Medicine

## 2019-06-02 NOTE — Chronic Care Management (AMB) (Signed)
  Chronic Care Management   Note  06/02/2019 Name: JENETTA WEASE MRN: [id number] DOB: 02/25/1940  EDLYN ROSENBURG is a 79 y.o. year old female who is a primary care patient of Susy Frizzle, MD. I reached out to [employer] by phone today in response to a referral sent by Ms. Emilio Math Cai's PCP, Susy Frizzle, MD.   Ms. Russman was given information about Chronic Care Management services today including:  1. CCM service includes personalized support from designated clinical staff supervised by her physician, including individualized plan of care and coordination with other care providers 2. 24/7 contact phone numbers for assistance for urgent and routine care needs. 3. Service will only be billed when office clinical staff spend 20 minutes or more in a month to coordinate care. 4. Only one practitioner may furnish and bill the service in a calendar month. 5. The patient may stop CCM services at any time (effective at the end of the month) by phone call to the office staff.   Patient agreed to services and verbal consent obtained.   Follow up plan:   [place]

## 2019-06-18 ENCOUNTER — Other Ambulatory Visit: Payer: Self-pay

## 2019-06-18 MED ORDER — PEN NEEDLES 31G X 6 MM MISC: 11 refills | Status: DC

## 2019-06-20 ENCOUNTER — Other Ambulatory Visit: Payer: Self-pay | Admitting: *Deleted

## 2019-06-20 MED ORDER — PEN NEEDLES 31G X 6 MM MISC: 11 refills | Status: DC

## 2019-06-25 DIAGNOSIS — R140 Abdominal distension (gaseous): Secondary | ICD-10-CM | POA: Diagnosis not present

## 2019-06-25 DIAGNOSIS — K5900 Constipation, unspecified: Secondary | ICD-10-CM | POA: Diagnosis not present

## 2019-07-07 ENCOUNTER — Ambulatory Visit (INDEPENDENT_AMBULATORY_CARE_PROVIDER_SITE_OTHER): Payer: Medicare Other | Admitting: Family Medicine

## 2019-07-07 ENCOUNTER — Other Ambulatory Visit: Payer: Self-pay

## 2019-07-07 ENCOUNTER — Encounter: Payer: Self-pay | Admitting: Family Medicine

## 2019-07-07 VITALS — BP 110/60 | HR 66 | Temp 98.6°F | Wt 176.0 lb

## 2019-07-07 DIAGNOSIS — E1142 Type 2 diabetes mellitus with diabetic polyneuropathy: Secondary | ICD-10-CM

## 2019-07-07 DIAGNOSIS — N183 Chronic kidney disease, stage 3 unspecified: Secondary | ICD-10-CM | POA: Diagnosis not present

## 2019-07-07 DIAGNOSIS — I4891 Unspecified atrial fibrillation: Secondary | ICD-10-CM | POA: Diagnosis not present

## 2019-07-07 DIAGNOSIS — Z794 Long term (current) use of insulin: Secondary | ICD-10-CM

## 2019-07-07 DIAGNOSIS — E118 Type 2 diabetes mellitus with unspecified complications: Secondary | ICD-10-CM | POA: Diagnosis not present

## 2019-07-07 NOTE — Progress Notes (Signed)
Subjective:    Patient ID: Laura Best, female    DOB: [birth year], 79 y.o.   MRN: [id number]  HPI   Patient has a history of atrial fibrillation.  Today she is in A. fib.  She is currently on Xarelto at a renal dose of 15 mg a day for stroke prevention.  She denies any palpitations.  She denies any tachycardia.  She denies any shortness of breath or syncope or symptoms of TIA.  She also has a history of insulin-dependent diabetes.  She is currently on 20 units of Levemir once a day and 7 units of Humalog with lunch and dinner.  She states that her blood sugars have been averaging between 70 and 130.  She denies any serious hypoglycemic episodes.  She denies any blood sugars greater than 200.  She denies any polyuria, polydipsia, or blurry vision.  Her blood pressure today is well controlled at 110/60.  She denies any chest pain/angina.  She does occasionally have neuropathy in her feet.  This is characterized by numbness.  She also reports burning in her feet at night as well as coolness.  However diabetic foot exam was performed today and shows normal dorsalis pedis and posterior tibialis pulses bilaterally with normal sensation to 10 g monofilament.  There are no ulcers or lesions on her feet.  She is due for fasting lab work. Past Medical History:  Diagnosis Date  . Arthritis    "hands, feet" (02/17/2016)  . Atrial fibrillation ([place])   . CAD (coronary artery disease)    a. 05/2016: cath showing nonobstructive CAD with 50% prox-Cx stenosis  . CKD (chronic kidney disease) stage 3, GFR 30-59 ml/min 12/25/2012  . COPD (chronic obstructive pulmonary disease) ([place])   . Diastolic heart failure ([place])   . Hyperlipidemia   . Hypertension   . Osteoporosis   . Pneumonia    "several times" (02/17/2016)  . Small bowel obstruction ([place]) 02/17/2016  . Tricuspid regurgitation    Echo 05/02/06-Nml LV. Mild MR. Mod TR  . Type II diabetes mellitus ([place])   . Vitamin D deficiency    Past Surgical History:   Procedure Laterality Date  . APPENDECTOMY    . HEART CHAMBER REVISION     patched hole --[place]  . LAPAROSCOPIC APPENDECTOMY N/A 09/27/2013   Procedure: APPENDECTOMY - CONVERTED FROM LAPAROSCOPIC;  Surgeon: Donnie Mesa, MD;  Location: [facility];  Service: General;  Laterality: N/A;  . RIGHT/LEFT HEART CATH AND CORONARY ANGIOGRAPHY N/A 05/24/2016   Procedure: Right/Left Heart Cath and Coronary Angiography;  Surgeon: Larey Dresser, MD;  Location: [facility];  Service: Cardiovascular;  Laterality: N/A;  . TUBAL LIGATION     Current Outpatient Medications on File Prior to Visit  Medication Sig Dispense Refill  . ACCU-CHEK AVIVA PLUS test strip CHECK BLOOD SUGAR 3 TIMES  DAILY BEFORE MEALS AND AT  BEDTIME. 400 strip 3  . Accu-Chek Softclix Lancets lancets CHECK BLOOD SUGAR 3 TIMES  DAILY BEFORE MEALS AND AT  BEDTIME 400 each 3  . acetaminophen (TYLENOL) 500 MG tablet Take 500 mg by mouth every 6 (six) hours as needed for pain.    Marland Kitchen amLODipine (NORVASC) 10 MG tablet TAKE ONE-HALF TABLET BY  MOUTH DAILY 45 tablet 3  . Blood Glucose Monitoring Suppl (ACCU-CHEK AVIVA PLUS) w/Device KIT USE TO CHECK BLOOD SUGAR 4  TIMES DAILY BEFORE MEALS  AND AT BEDTIME. Dx: E11.65. 1 kit 1  . calcitRIOL (ROCALTROL) 0.25 MCG capsule  Take 1 capsule (0.25 mcg total) by mouth every Monday, Wednesday, and Friday. 90 capsule 1  . carvedilol (COREG) 12.5 MG tablet TAKE 1 TABLET BY MOUTH TWO  TIMES DAILY 180 tablet 3  . Cholecalciferol (VITAMIN D) 2000 UNITS CAPS Take 2,000 Units by mouth daily.     . cloNIDine (CATAPRES) 0.3 MG tablet TAKE 1 TABLET BY MOUTH TWO  TIMES DAILY 180 tablet 3  . dicyclomine (BENTYL) 20 MG tablet Take 1 tablet (20 mg total) by mouth every 6 (six) hours as needed for spasms (intestinal spasms). 30 tablet 0  . febuxostat (ULORIC) 40 MG tablet TAKE 1 TABLET BY MOUTH  DAILY 90 tablet 3  . fluticasone (FLONASE) 50 MCG/ACT nasal spray Place 2 sprays into both nostrils daily. 16 g 6  .  Fluticasone-Umeclidin-Vilant (TRELEGY ELLIPTA) 100-62.5-25 MCG/INH AEPB Inhale 1 Inhaler into the lungs daily. 1 each 11  . furosemide (LASIX) 40 MG tablet TAKE 1 AND 1/2 TABLETS  (60MG) BY MOUTH DAILY 135 tablet 3  . gabapentin (NEURONTIN) 100 MG capsule TAKE 1 TO 2 CAPSULES BY  MOUTH AT BEDTIME FOR  PERIPHERAL NEUROPATHY. 180 capsule 3  . HUMALOG KWIKPEN 100 UNIT/ML KwikPen INJECT 0.07 MLS (7 UNITS  TOTAL) INTO THE SKIN 3  (THREE) TIMES DAILY. 30 mL 3  . Insulin Detemir (LEVEMIR FLEXTOUCH) 100 UNIT/ML Pen Inject 20 Units into the skin at bedtime. SUBCUTANEOUSLY ONCE DAILY AT  10  PM 15 pen 3  . Insulin Pen Needle (PEN NEEDLES) 31G X 6 MM MISC Use as directed to inject insulin SQ TID. 300 each 11  . ipratropium-albuterol (DUONEB) 0.5-2.5 (3) MG/3ML SOLN Take 3 mLs by nebulization every 6 (six) hours as needed. 360 mL 5  . lubiprostone (AMITIZA) 24 MCG capsule TAKE 1 CAPSULE BY MOUTH TWICE DAILY WITH A MEAL 180 capsule 3  . omeprazole (PRILOSEC) 20 MG capsule TAKE 1 CAPSULE BY MOUTH  DAILY 90 capsule 3  . ondansetron (ZOFRAN ODT) 4 MG disintegrating tablet Take 1 tablet (4 mg total) by mouth every 8 (eight) hours as needed. 10 tablet 1  . polyethylene glycol (MIRALAX) packet Take 17 g by mouth daily. Hold for diarrhea. 28 each 1  . psyllium (HYDROCIL/METAMUCIL) 95 % PACK Take 1 packet by mouth 3 (three) times daily. 56 each 0  . simvastatin (ZOCOR) 20 MG tablet TAKE 1 TABLET BY MOUTH ONCE DAILY AT BEDTIME 90 tablet 3  . XARELTO 15 MG TABS tablet TAKE 1 TABLET BY MOUTH  DAILY 90 tablet 3   No current facility-administered medications on file prior to visit.    Allergies  Allergen Reactions  . Ace Inhibitors Other (See Comments)    Hyperkalemia  . Actos [Pioglitazone] Swelling  . Aspirin Other (See Comments)    G.I. Upset. In high doses  . Metformin And Related Diarrhea  . Eliquis [Apixaban] Rash and Other (See Comments)    Patient states that this med was d/ced by prescriber due to  interference with allergies and states that she also had a breakout   Social History   Socioeconomic History  . Marital status: Widowed    Spouse name: Not on file  . Number of children: Not on file  . Years of education: Not on file  . Highest education level: Not on file  Occupational History  . Occupation: retired  Tobacco Use  . Smoking status: Former Smoker    Packs/day: 0.50    Years: 50.00    Pack years: 25.00  Types: Cigarettes    Quit date: 01/19/2009    Years since quitting: 10.4  . Smokeless tobacco: Never Used  Vaping Use  . Vaping Use: Never used  Substance and Sexual Activity  . Alcohol use: No    Alcohol/week: 0.0 standard drinks  . Drug use: No  . Sexual activity: Never  Other Topics Concern  . Not on file  Social History Narrative   Entered 10/2013:   She has never driven.   She lives with her son.   Social Determinants of Health   Financial Resource Strain:   . Difficulty of Paying Living Expenses:   Food Insecurity:   . Worried About Charity fundraiser in the Last Year:   . Arboriculturist in the Last Year:   Transportation Needs:   . Film/video editor (Medical):   Marland Kitchen Lack of Transportation (Non-Medical):   Physical Activity:   . Days of Exercise per Week:   . Minutes of Exercise per Session:   Stress:   . Feeling of Stress :   Social Connections:   . Frequency of Communication with Friends and Family:   . Frequency of Social Gatherings with Friends and Family:   . Attends Religious Services:   . Active Member of Clubs or Organizations:   . Attends Archivist Meetings:   Marland Kitchen Marital Status:   Intimate Partner Violence:   . Fear of Current or Ex-Partner:   . Emotionally Abused:   Marland Kitchen Physically Abused:   . Sexually Abused:       Review of Systems  All other systems reviewed and are negative.      Objective:   Physical Exam Vitals reviewed.  Constitutional:      General: She is not in acute distress.     Appearance: She is well-developed. She is not diaphoretic.  HENT:     Head: Normocephalic and atraumatic.  Neck:     Vascular: No JVD.  Cardiovascular:     Rate and Rhythm: Normal rate. Rhythm irregularly irregular.  Pulmonary:     Effort: Pulmonary effort is normal. No respiratory distress.     Breath sounds: No wheezing, rhonchi or rales.  Abdominal:     General: Bowel sounds are normal. There is no distension.     Palpations: Abdomen is soft. There is no mass.     Tenderness: There is no abdominal tenderness. There is no right CVA tenderness, left CVA tenderness, guarding or rebound.     Hernia: No hernia is present.  Musculoskeletal:     Cervical back: Neck supple.     Right lower leg: Edema present.     Left lower leg: Edema present.  Lymphadenopathy:     Cervical: No cervical adenopathy.           Assessment & Plan:  Controlled type 2 diabetes mellitus with complication, with long-term current use of insulin (HCC) - Plan: Hemoglobin A1c, CBC with Differential/Platelet, COMPLETE METABOLIC PANEL WITH GFR, Lipid panel, Microalbumin, urine  Atrial fibrillation, unspecified type (HCC)  Stage 3 chronic kidney disease, unspecified whether stage 3a or 3b CKD  Diabetic peripheral neuropathy associated with type 2 diabetes mellitus ([place])  Blood sugars sound well controlled.  Check an A1c today.  Goal A1c is less than 7.5.  Also check a urine microalbumin.  Check a fasting lipid panel.  Goal LDL cholesterol is less than 100.  Check a CBC to monitor for any anemia due to her chronic anticoagulation and chronic  kidney disease.  Patient has a history of chronic kidney disease.  Check a CMP to monitor renal function.  Patient has atrial fibrillation however her rate is controlled today on exam.  She is appropriately anticoagulated.  Her blood pressure is well controlled today.  Regular anticipatory guidance is provided.  Strongly encouraged the patient to get her COVID-19 vaccination.

## 2019-07-08 ENCOUNTER — Ambulatory Visit: Payer: Medicare Other | Admitting: Pharmacist

## 2019-07-08 ENCOUNTER — Other Ambulatory Visit: Payer: Self-pay | Admitting: Family Medicine

## 2019-07-08 DIAGNOSIS — E118 Type 2 diabetes mellitus with unspecified complications: Secondary | ICD-10-CM

## 2019-07-08 DIAGNOSIS — E1169 Type 2 diabetes mellitus with other specified complication: Secondary | ICD-10-CM

## 2019-07-08 DIAGNOSIS — N184 Chronic kidney disease, stage 4 (severe): Secondary | ICD-10-CM

## 2019-07-08 DIAGNOSIS — I152 Hypertension secondary to endocrine disorders: Secondary | ICD-10-CM

## 2019-07-08 DIAGNOSIS — E1159 Type 2 diabetes mellitus with other circulatory complications: Secondary | ICD-10-CM

## 2019-07-08 LAB — HEMOGLOBIN A1C
Hgb A1c MFr Bld: 6.9 % of total Hgb — ABNORMAL HIGH (ref <5.7)
Mean Plasma Glucose: 151 (calc)
eAG (mmol/L): 8.4 (calc)

## 2019-07-08 LAB — COMPLETE METABOLIC PANEL WITH GFR
AG Ratio: 1.5 (calc) (ref 1.0–2.5)
ALT: 6 U/L (ref 6–29)
AST: 8 U/L — ABNORMAL LOW (ref 10–35)
Albumin: 3.8 g/dL (ref 3.6–5.1)
Alkaline phosphatase (APISO): 107 U/L (ref 37–153)
BUN/Creatinine Ratio: 20 (calc) (ref 6–22)
BUN: 36 mg/dL — ABNORMAL HIGH (ref 7–25)
CO2: 29 mmol/L (ref 20–32)
Calcium: 8.6 mg/dL (ref 8.6–10.4)
Chloride: 104 mmol/L (ref 98–110)
Creat: 1.8 mg/dL — ABNORMAL HIGH (ref 0.60–0.93)
GFR, Est African American: 31 mL/min/{1.73_m2} — ABNORMAL LOW (ref >=60)
GFR, Est Non African American: 26 mL/min/{1.73_m2} — ABNORMAL LOW (ref >=60)
Globulin: 2.6 g/dL (calc) (ref 1.9–3.7)
Glucose, Bld: 114 mg/dL — ABNORMAL HIGH (ref 65–99)
Potassium: 4.8 mmol/L (ref 3.5–5.3)
Sodium: 140 mmol/L (ref 135–146)
Total Bilirubin: 0.4 mg/dL (ref 0.2–1.2)
Total Protein: 6.4 g/dL (ref 6.1–8.1)

## 2019-07-08 LAB — CBC WITH DIFFERENTIAL/PLATELET
Absolute Monocytes: 818 cells/uL (ref 200–950)
Basophils Absolute: 81 cells/uL (ref 0–200)
Basophils Relative: 1.3 %
Eosinophils Absolute: 149 cells/uL (ref 15–500)
Eosinophils Relative: 2.4 %
HCT: 35.9 % (ref 35.0–45.0)
Hemoglobin: 11.6 g/dL — ABNORMAL LOW (ref 11.7–15.5)
Lymphs Abs: 1054 cells/uL (ref 850–3900)
MCH: 28.6 pg (ref 27.0–33.0)
MCHC: 32.3 g/dL (ref 32.0–36.0)
MCV: 88.4 fL (ref 80.0–100.0)
MPV: 11.4 fL (ref 7.5–12.5)
Monocytes Relative: 13.2 %
Neutro Abs: 4098 cells/uL (ref 1500–7800)
Neutrophils Relative %: 66.1 %
Platelets: 211 10*3/uL (ref 140–400)
RBC: 4.06 10*6/uL (ref 3.80–5.10)
RDW: 13.7 % (ref 11.0–15.0)
Total Lymphocyte: 17 %
WBC: 6.2 10*3/uL (ref 3.8–10.8)

## 2019-07-08 LAB — MICROALBUMIN, URINE: Microalb, Ur: 0.6 mg/dL

## 2019-07-08 LAB — LIPID PANEL
Cholesterol: 116 mg/dL (ref <200)
HDL: 48 mg/dL — ABNORMAL LOW (ref >=50)
LDL Cholesterol (Calc): 49 mg/dL (calc)
Non-HDL Cholesterol (Calc): 68 mg/dL (calc) (ref <130)
Total CHOL/HDL Ratio: 2.4 (calc) (ref <5.0)
Triglycerides: 108 mg/dL (ref <150)

## 2019-07-08 NOTE — Chronic Care Management (AMB) (Addendum)
Chronic Care Management Pharmacy  Name: Laura Best  MRN: [id number] DOB: 09/09/1940  Chief Complaint/ HPI  Laura Best,  79 y.o. , female presents for their Initial CCM visit with the clinical pharmacist via telephone.  PCP : Laura Frizzle, MD  Their chronic conditions include:  Hypertension, CHF, Afib, COPD, GERD, Type II DM, CKD, osteoporosis, hyperlipidemia,.  Office Visits:  05/09/2019 (Laura Best) - bloating and mild pain hours after eating, normal abdominal exam, started on Bentyl '20mg'$  q6h, if no improvement recommends ultrasound  03/17/2019 (Laura Best) - LDL cholesterol goal well below 100.  Kidney function has declined, decreased Lasix to '40mg'$  daily with recheck in 4 months.  Consult Visit: none recent  Medications: Outpatient Encounter Medications as of 07/08/2019  Medication Sig   ACCU-CHEK AVIVA PLUS test strip CHECK BLOOD SUGAR 3 TIMES  DAILY BEFORE MEALS AND AT  BEDTIME.   Accu-Chek Softclix Lancets lancets CHECK BLOOD SUGAR 3 TIMES  DAILY BEFORE MEALS AND AT  BEDTIME   acetaminophen (TYLENOL) 500 MG tablet Take 500 mg by mouth every 6 (six) hours as needed for pain.   amLODipine (NORVASC) 10 MG tablet TAKE ONE-HALF TABLET BY  MOUTH DAILY   Blood Glucose Monitoring Suppl (ACCU-CHEK AVIVA PLUS) w/Device KIT USE TO CHECK BLOOD SUGAR 4  TIMES DAILY BEFORE MEALS  AND AT BEDTIME. Dx: E11.65.   calcitRIOL (ROCALTROL) 0.25 MCG capsule Take 1 capsule (0.25 mcg total) by mouth every Monday, Wednesday, and Friday.   carvedilol (COREG) 12.5 MG tablet TAKE 1 TABLET BY MOUTH TWO  TIMES DAILY   Cholecalciferol (VITAMIN D) 2000 UNITS CAPS Take 2,000 Units by mouth daily.    cloNIDine (CATAPRES) 0.3 MG tablet TAKE 1 TABLET BY MOUTH TWO  TIMES DAILY   dicyclomine (BENTYL) 20 MG tablet Take 1 tablet (20 mg total) by mouth every 6 (six) hours as needed for spasms (intestinal spasms).   febuxostat (ULORIC) 40 MG tablet TAKE 1 TABLET BY MOUTH  DAILY   fluticasone (FLONASE) 50 MCG/ACT  nasal spray Place 2 sprays into both nostrils daily.   Fluticasone-Umeclidin-Vilant (TRELEGY ELLIPTA) 100-62.5-25 MCG/INH AEPB Inhale 1 Inhaler into the lungs daily.   furosemide (LASIX) 40 MG tablet TAKE 1 AND 1/2 TABLETS  ('60MG'$ ) BY MOUTH DAILY (Patient taking differently: 40 mg daily. )   gabapentin (NEURONTIN) 100 MG capsule TAKE 1 TO 2 CAPSULES BY  MOUTH AT BEDTIME FOR  PERIPHERAL NEUROPATHY.   HUMALOG KWIKPEN 100 UNIT/ML KwikPen INJECT 0.07 MLS (7 UNITS  TOTAL) INTO THE SKIN 3  (THREE) TIMES DAILY. (Patient taking differently: Inject 7 Units into the skin 2 (two) times daily. )   Insulin Detemir (LEVEMIR FLEXTOUCH) 100 UNIT/ML Pen Inject 20 Units into the skin at bedtime. SUBCUTANEOUSLY ONCE DAILY AT  10  PM   Insulin Pen Needle (PEN NEEDLES) 31G X 6 MM MISC Use as directed to inject insulin SQ TID.   ipratropium-albuterol (DUONEB) 0.5-2.5 (3) MG/3ML SOLN Take 3 mLs by nebulization every 6 (six) hours as needed.   lubiprostone (AMITIZA) 24 MCG capsule TAKE 1 CAPSULE BY MOUTH TWICE DAILY WITH A MEAL   omeprazole (PRILOSEC) 20 MG capsule TAKE 1 CAPSULE BY MOUTH  DAILY   polyethylene glycol (MIRALAX) packet Take 17 g by mouth daily. Hold for diarrhea.   psyllium (HYDROCIL/METAMUCIL) 95 % PACK Take 1 packet by mouth 3 (three) times daily.   simvastatin (ZOCOR) 20 MG tablet TAKE 1 TABLET BY MOUTH ONCE DAILY AT BEDTIME   XARELTO 15 MG TABS tablet  TAKE 1 TABLET BY MOUTH  DAILY   ondansetron (ZOFRAN ODT) 4 MG disintegrating tablet Take 1 tablet (4 mg total) by mouth every 8 (eight) hours as needed. (Patient not taking: Reported on 07/08/2019)   No facility-administered encounter medications on file as of 07/08/2019.     Current Diagnosis/Assessment:    [employer]: Low Risk    Difficulty of Paying Living Expenses: Not very hard     Goals Addressed             This Visit's Progress    Pharmacy Care Plan:       CARE PLAN ENTRY  Current Barriers:  Chronic Disease  Management support, education, and care coordination needs related to Hypertension, Hyperlipidemia, Diabetes, and Chronic Kidney Disease   Hypertension Pharmacist Clinical Goal(s): Over the next 180 days, patient will work with PharmD and providers to maintain BP goal <130/80 Current regimen:  Amlodipine '5mg'$  daily Carvedilol 12.'5mg'$  twice daily Clonidine 0.'3mg'$  daily Interventions: Comprehensive medication review Reviewed home blood pressure monitoring Continued current therapy Patient self care activities - Over the next 180 days, patient will: Check BP twice daily, document, and provide at future appointments Ensure daily salt intake < 2300 mg/day Contact PharmD or PCP with BP consistently > 130/80  Hyperlipidemia Pharmacist Clinical Goal(s): Over the next 180 days, patient will work with PharmD and providers to maintain LDL goal < 100 Current regimen:  Simvastatin '20mg'$  Interventions: Discussed current dietary changes Reviewed most recent lab results Patient self care activities - Over the next 180 days, patient will: Continue to focus on medication adherence by pill box Continue changes made to diet while ensuring to eat balanced meals throughout the day without skipping  Diabetes Pharmacist Clinical Goal(s): Over the next 180 days, patient will work with PharmD and providers to maintain A1c goal <7% Current regimen:  Levemir 100units/ml 20 units hs Humalog 7 units twice daily Interventions: Reviewed home monitoring logs Discussed ways to recognize and treat hypoglycemia Counseled on eating regular meals when taking insulin  Patient self care activities - Over the next 180 days, patient will: Check blood sugar 3-4 times daily, document, and provide at future appointments Contact provider with any episodes of hypoglycemia Remember to eat around all doses of insulin to avoid hypoglycemia  CKD Pharmacist Clinical Goal(s) Over the next 180 days, patient will work with  PharmD and providers to optimize medication regimen based on current kidney function. Interventions: Reviewed all meds for safety based on kidney function. Counseled on avoidance of NSAIDs for pain/inflammation. Patient self care activities - Over the next 180 days, patient will: Continue to take medications as directed. Contact PharmD or PCP with any medication related questions or concerns.   Initial Goal Documentation         AFIB   Patient is currently rate controlled.   Patient has failed these meds in past: none noted Patient is currently controlled on the following medications: carvedilol 12.'5mg'$  twice daily, Xarelto '15mg'$  daily  Patient reports no chest pain, no signs of abnormal bleeding.  Plan  Continue current medications.  ,  COPD   Eosinophil count:   Lab Results  Component Value Date/Time   EOSPCT 2.4 07/07/2019 10:25 AM  %                               Eos (Absolute):  Lab Results  Component Value Date/Time   EOSABS 149 07/07/2019 10:25 AM  Tobacco Status:  Social History   Tobacco Use  Smoking Status Former Smoker   Packs/day: 0.50   Years: 50.00   Pack years: 25.00   Types: Cigarettes   Quit date: 01/19/2009   Years since quitting: 10.4  Smokeless Tobacco Never Used    Patient has failed these meds in past: Combivent Patient is currently controlled on the following medications: Trelegy ellipta, Duoneb inhaler Using maintenance inhaler regularly? No Frequency of rescue inhaler use:  infrequently  Reports dyspnea on exertion occasionally.  Currently just using Trelegy.  Discussed the use of Duoneb prn whenever she feels SOB.  Patient states that episodes are very short lived and go away shortly after rest.  Plan  Continue current medications.  Remember to use Duonebs prn when SOB.   Hyperlipidemia   Lipid Panel     Component Value Date/Time   CHOL 116 07/07/2019 1025   TRIG 108 07/07/2019 1025   HDL 48 (L) 07/07/2019 1025    LDLCALC 49 07/07/2019 1025     The ASCVD Risk score (Goff DC Jr., et al., 2013) failed to calculate for the following reasons:   The valid total cholesterol range is 130 to 320 mg/dL   Patient has failed these meds in past: none noted Patient is currently controlled on the following medications:  Simvastatin '20mg'$   Lipids WNL, patient watching her diet and making smart choices.  Did mention she sometimes does not eat, stressed importance of balanced meals throughout the day.  Plan  Continue current medications   Diabetes   Recent Relevant Labs: Lab Results  Component Value Date/Time   HGBA1C 6.9 (H) 07/07/2019 10:25 AM   HGBA1C 7.7 (H) 03/17/2019 10:51 AM   HGBA1C 6.9 (H) 05/10/2017 02:35 PM   HGBA1C CANCELED 04/05/2015 11:09 AM   MICROALBUR 0.6 07/07/2019 10:25 AM   MICROALBUR 4.1 03/17/2019 11:24 AM     Checking BG:  four times daily  Recent FBG Readings: 90s No log available, however she checks fasting and at every meal.  Only numbers she could recall were fasting blood sugars typically in the 90s.  Patient is currently controlled on the following medications: Levemir 100units/ml 20 units at bedtime, Humalog 7 units twice day  Last diabetic Foot exam: No results found for: HMDIABEYEEXA  Last diabetic Eye exam: No results found for: HMDIABFOOTEX   We discussed: how to recognize and treat signs of hypoglycemia.  Patient had a few episodes in the high 60s and upper 70s which she treated appropriately with OJ.  Mentions she was watching what she eats and sometimes is not hungry.  Counseled on importance of eating with all doses of insulin to avoid hypoglycemia.  Plan  Continue current medications.  Ensure regular meals with insulin doses.  Heart Failure   Type: Diastolic  Last ejection fraction: 55-60% NYHA Class: II (slight limitation of activity) AHA HF Stage: C (Heart disease and symptoms present)  Patient has failed these meds in past: none noted Patient is  currently controlled on the following medications: furosemide '40mg'$  daily  Recently decreased from '60mg'$  daily to '40mg'$ , no excessive swelling per patient and not abnormal weight gain.  Plan  Continue current medications ,  Hypertension   Office blood pressures are  BP Readings from Last 3 Encounters:  07/07/19 110/60  05/09/19 (!) 166/80  03/17/19 110/64   Patient has failed these meds in the past: benazepril '40mg'$ , clonidine 0.'2mg'$ , lisinopril '20mg'$   Patient checks BP at home twice daily  Patient home BP readings  are ranging: 138/83  Patient is currently controlled on the following medications: amlodipine '5mg'$  daily, carvedilol 12.'5mg'$  twice daily, clonidine 0.'3mg'$   Currently checking twice daily, no chest pain or dizziness, swelling minimal from amlodipine.  Controlled with furosemide '40mg'$  daily.  Plan  Continue current medications      GERD    Patient has failed these meds in past: none noted Patient is currently controlled on the following medications: omeprazole '20mg'$   Patient takes daily with no symptoms.    Plan  Continue current medications  CKD   Kidney Function Lab Results  Component Value Date/Time   CREATININE 1.80 (H) 07/07/2019 10:25 AM   CREATININE 1.84 (H) 03/17/2019 10:51 AM   GFRNONAA 26 (L) 07/07/2019 10:25 AM   GFRAA 31 (L) 07/07/2019 10:25 AM    Stage IV kidney failure.  All medications evaluated for safety based off of most recent kidney function.  No NSAIDS  Plan  Continue current medications.  Monitor fluid status and evaluate for need to increase Lasix.     Vaccines   Reviewed and discussed patient's vaccination history.    Immunization History  Administered Date(s) Administered   Fluad Quad(high Dose 65+) 10/11/2018   Influenza Whole 11/30/2009   Influenza, High Dose Seasonal PF 10/23/2017   Influenza,inj,Quad PF,6+ Mos 12/16/2012, 10/03/2013, 10/05/2014, 10/18/2015, 09/20/2016   Pneumococcal Conjugate-13 12/25/2012    Pneumococcal Polysaccharide-23 11/30/2009   Tdap 12/25/2012    Plan  Recommended patient receive shingrix vaccine in office/pharmacy.   Medication Management   Miscellaneous medications: bentyl '20mg'$ , febuxostat '40mg'$ , Amitiza 89mg OTC's: Tylenol '500mg'$ , Miralax, vitamin D Patient currently uses [facility] pharmacy.  Phone #  [phone] reports using pill box method to organize medications and promote adherence. Patient denies missed doses of medication.   CBeverly Milch PharmD Clinical Pharmacist [place]([phone] have collaborated with the care management provider regarding care management and care coordination activities outlined in this encounter and have reviewed this encounter including documentation in the note and care plan. I am certifying that I agree with the content of this note and encounter as supervising physician.

## 2019-07-08 NOTE — Patient Instructions (Addendum)
Visit Information Thank you for meeting with me today!  I look forward to working with you to help you meet all of your healthcare goals and answer any questions you may have.  Feel free to contact me anytime!  Goals Addressed            This Visit's Progress   . Pharmacy Care Plan:       CARE PLAN ENTRY  Current Barriers:  . Chronic Disease Management support, education, and care coordination needs related to Hypertension, Hyperlipidemia, Diabetes, and Chronic Kidney Disease   Hypertension . Pharmacist Clinical Goal(s): o Over the next 180 days, patient will work with PharmD and providers to maintain BP goal <130/80 . Current regimen:  o Amlodipine 5mg  daily o Carvedilol 12.5mg  twice daily o Clonidine 0.3mg  daily . Interventions: o Comprehensive medication review o Reviewed home blood pressure monitoring o Continued current therapy . Patient self care activities - Over the next 180 days, patient will: o Check BP twice daily, document, and provide at future appointments o Ensure daily salt intake < 2300 mg/day o Contact PharmD or PCP with BP consistently > 130/80  Hyperlipidemia . Pharmacist Clinical Goal(s): o Over the next 180 days, patient will work with PharmD and providers to maintain LDL goal < 100 . Current regimen:  o Simvastatin 20mg  . Interventions: o Discussed current dietary changes o Reviewed most recent lab results . Patient self care activities - Over the next 180 days, patient will: o Continue to focus on medication adherence by pill box o Continue changes made to diet while ensuring to eat balanced meals throughout the day without skipping  Diabetes . Pharmacist Clinical Goal(s): o Over the next 180 days, patient will work with PharmD and providers to maintain A1c goal <7% . Current regimen:  o Levemir 100units/ml 20 units hs o Humalog 7 units twice daily . Interventions: o Reviewed home monitoring logs o Discussed ways to recognize and treat  hypoglycemia o Counseled on eating regular meals when taking insulin  . Patient self care activities - Over the next 180 days, patient will: o Check blood sugar 3-4 times daily, document, and provide at future appointments o Contact provider with any episodes of hypoglycemia o Remember to eat around all doses of insulin to avoid hypoglycemia  CKD . Pharmacist Clinical Goal(s) o Over the next 180 days, patient will work with PharmD and providers to optimize medication regimen based on current kidney function. . Interventions: o Reviewed all meds for safety based on kidney function. o Counseled on avoidance of NSAIDs for pain/inflammation. . Patient self care activities - Over the next 180 days, patient will: o Continue to take medications as directed. o Contact PharmD or PCP with any medication related questions or concerns.   Initial Goal Documentation        Ms. Guizar was given information about Chronic Care Management services today including:  1. CCM service includes personalized support from designated clinical staff supervised by her physician, including individualized plan of care and coordination with other care providers 2. 24/7 contact phone numbers for assistance for urgent and routine care needs. 3. Standard insurance, coinsurance, copays and deductibles apply for chronic care management only during months in which we provide at least 20 minutes of these services. Most insurances cover these services at 100%, however patients may be responsible for any copay, coinsurance and/or deductible if applicable. This service may help you avoid the need for more expensive face-to-face services. 4. Only one practitioner may furnish  and bill the service in a calendar month. 5. The patient may stop CCM services at any time (effective at the end of the month) by phone call to the office staff.  Patient agreed to services and verbal consent obtained.   The patient verbalized understanding  of instructions provided today and agreed to receive a mailed copy of patient instruction and/or educational materials. Telephone follow up appointment with pharmacy team member scheduled for: 01/13/2020 @ 4:30 PM  Beverly Milch, PharmD Clinical Pharmacist [place] [phone]   End-Stage Kidney Disease End-stage kidney disease occurs when the kidneys are so damaged that they cannot function and cannot get better. This condition may also be referred to as end-stage renal disease or ESRD. The kidneys are two organs that do many important jobs in the body, including:  Removing wastes and extra fluids from the blood.  Making hormones that maintain the amount of fluid in your tissues and blood vessels.  Maintaining the right amount of fluids and chemicals in the body. Without functioning kidneys, toxins build up in the blood and life-threatening complications can occur. What are the causes? This condition usually occurs when a long-term (chronic) kidney disease gets worse and results in permanent damage to the kidneys. It may also be caused by sudden damage to the kidneys (acute kidney injury). Causes of this condition include:  Having a family history of chronic kidney disease (CKD).  Having chronic kidney disease for many years.  Chronic medical conditions that affect the kidneys, such as: ? Cardiovascular disease, including high blood pressure. ? Diabetes. ? Certain diseases that affect the body's disease-fighting (immune) system.  Overuse of over-the-counter pain medicines.  Being around or being in contact with poisonous (toxic) substances. What increases the risk? The following factors may make you more likely to develop this condition:  Being older than 60.  Being female.  Being of African-American, Asian, Native American, [place], or Hispanic descent.  Smoking or a history of smoking.  Obesity. What are the signs or symptoms? Symptoms  of this condition include:  Swelling (edema) of the face, legs, ankles, or feet.  Numbness, tingling, or loss of feeling in the hands or feet.  Tiredness (lethargy).  Nausea or vomiting.  Confusion, trouble concentrating, or loss of consciousness.  Chest pain.  Shortness of breath.  Passing little or no urine.  Muscle twitches and cramps, especially in the legs.  Dry, itchy skin.  Loss of appetite.  Pale skin due to anemia, including the skin and tissue around the eye (conjunctiva).  Headaches.  Abnormally dark or light skin.  Decrease in muscle size (muscle wasting).  Easy bruising.  Frequent hiccups.  Stopping of the monthly period in women.  Jerky movements (seizures). How is this diagnosed? This condition may be diagnosed based on:  A physical exam, including blood pressure measurements.  Urine tests.  Blood tests.  Imaging tests.  A test in which a sample of tissue is removed from the kidneys to be examined under a microscope (kidney biopsy). How is this treated? This condition may be treated with:  A procedure that removes toxic wastes from the body (dialysis). There are two types of dialysis: ? Dialysis that is done through your abdomen (peritoneal dialysis). This may be done several times a day. ? Dialysis that is done by a machine (hemodialysis). This may be done several times a week.  Surgery to receive a new kidney (kidney transplant). In addition to having dialysis or a kidney transplant,  you may need to take medicines:  To control high blood pressure (hypertension).  To control high cholesterol.  To treat diabetes.  To maintain healthy levels of minerals in the blood (electrolytes). You may also be given a specific meal plan to follow that includes requirements or limits for:  Salt (sodium).  Protein.  Phosphorous.  Potassium.  Calcium. Follow these instructions at home: Medicines  Take over-the-counter and prescription  medicines only as told by your health care provider.  Do not take any new medicines, vitamins, or mineral supplements unless approved by your health care provider. Many medicines and supplements can worsen kidney damage.  Follow instructions from your health care provider about adjusting the doses of any medicines you take. Lifestyle  Do not use any products that contain nicotine or tobacco, such as cigarettes and e-cigarettes. If you need help quitting, ask your health care provider.  Achieve and maintain a healthy weight. If you need help with this, ask your health care provider.  Start or continue an exercise plan. Exercise at least 30 minutes a day, 5 days a week.  Follow your prescribed meal plan. General instructions  Stay current with your shots (immunizations) as told by your health care provider.  Keep track of your blood pressure. Report changes in your blood pressure as told by your health care provider.  If you are being treated for diabetes, monitor and track your blood sugar (blood glucose) levels as told by your health care provider.  Keep all follow-up visits as told by your health care provider. This is important. Where to find more information  American Association of Kidney Patients: BombTimer.gl  National Kidney Foundation: www.kidney.[place]: https://mathis.com/  [facility]: www.lifeoptions.org and www.kidneyschool.org Contact a health care provider if:  Your symptoms get worse.  You develop new symptoms. Get help right away if:  You have weakness in an arm or leg on one side of your body.  You have difficulty speaking or you are slurring your speech.  You have a sudden change in your vision.  You have a sudden, severe headache.  You have a sudden weight increase.  You have difficulty breathing.  Your symptoms suddenly get worse. Summary  End-stage kidney disease occurs when the kidneys are so damaged  that they cannot function and cannot get better.  Without functioning kidneys, toxins build up in the blood and life-threatening complications can occur.  Treatment may include dialysis or a kidney transplant along with medicines and lifestyle changes. This information is not intended to replace advice given to you by your health care provider. Make sure you discuss any questions you have with your health care provider. Document Revised: 12/22/2016 Document Reviewed: 02/15/2016 Elsevier Patient Education  2020 [employer].

## 2019-07-09 ENCOUNTER — Telehealth: Payer: Self-pay | Admitting: Family Medicine

## 2019-07-09 NOTE — Telephone Encounter (Signed)
Patient called to let Dr. Dennard Schaumann know that she went and got her johnson and j[employer]son covid shot.  CB# [phone]

## 2019-07-09 NOTE — Telephone Encounter (Signed)
Made note in Pt record of Covid vaccine

## 2019-07-17 ENCOUNTER — Other Ambulatory Visit: Payer: Self-pay | Admitting: *Deleted

## 2019-07-17 DIAGNOSIS — E876 Hypokalemia: Secondary | ICD-10-CM

## 2019-07-17 DIAGNOSIS — I272 Pulmonary hypertension, unspecified: Secondary | ICD-10-CM

## 2019-07-17 DIAGNOSIS — E1121 Type 2 diabetes mellitus with diabetic nephropathy: Secondary | ICD-10-CM

## 2019-07-17 DIAGNOSIS — D649 Anemia, unspecified: Secondary | ICD-10-CM

## 2019-07-17 DIAGNOSIS — N183 Chronic kidney disease, stage 3 unspecified: Secondary | ICD-10-CM

## 2019-07-17 DIAGNOSIS — I10 Essential (primary) hypertension: Secondary | ICD-10-CM

## 2019-07-17 DIAGNOSIS — I5033 Acute on chronic diastolic (congestive) heart failure: Secondary | ICD-10-CM

## 2019-07-17 DIAGNOSIS — E1142 Type 2 diabetes mellitus with diabetic polyneuropathy: Secondary | ICD-10-CM

## 2019-07-17 DIAGNOSIS — I4891 Unspecified atrial fibrillation: Secondary | ICD-10-CM

## 2019-07-17 DIAGNOSIS — E785 Hyperlipidemia, unspecified: Secondary | ICD-10-CM

## 2019-09-01 ENCOUNTER — Other Ambulatory Visit: Payer: Self-pay | Admitting: Family Medicine

## 2019-09-01 MED ORDER — LEVEMIR FLEXTOUCH 100 UNIT/ML ~~LOC~~ SOPN: 20.0000 [IU] | PEN_INJECTOR | Freq: Every day | SUBCUTANEOUS | 3 refills | Status: DC

## 2019-09-01 NOTE — Telephone Encounter (Signed)
Prescription sent to pharmacy.

## 2019-09-01 NOTE — Telephone Encounter (Signed)
CB# [phone] Refill  Levemir

## 2019-09-08 ENCOUNTER — Telehealth: Payer: Self-pay | Admitting: Family Medicine

## 2019-09-08 NOTE — Telephone Encounter (Signed)
CB# [phone] Having  running nose can some medication prescribe for sinuses infection

## 2019-09-11 NOTE — Telephone Encounter (Signed)
Call placed to patient.  Reports that she is feeling much improved. States that she has taken some OTC medications for nasal congestion.

## 2019-10-01 ENCOUNTER — Telehealth (INDEPENDENT_AMBULATORY_CARE_PROVIDER_SITE_OTHER): Payer: Medicare Other | Admitting: Nurse Practitioner

## 2019-10-01 DIAGNOSIS — Z9114 Patient's other noncompliance with medication regimen: Secondary | ICD-10-CM

## 2019-10-01 DIAGNOSIS — K5909 Other constipation: Secondary | ICD-10-CM | POA: Diagnosis not present

## 2019-10-01 NOTE — Progress Notes (Addendum)
Virtual Visit via Telephone Note  I connected with Laura Best on 10/01/19 at  9:30 AM EDT by telephone and verified that I am speaking with the correct person using two identifiers.   I discussed the limitations, risks, security and privacy concerns of performing an evaluation and management service by telephone and the availability of in person appointments. I also discussed with the patient that there may be a patient responsible charge related to this service. The patient expressed understanding and agreed to proceed.  Pt was at home and Provider located at clinic [facility].     History of Present Illness: Pt is a 79 year old female presenting for a telephone visit originally for cough and nasal drainage however she reports resolved yesterday was allergies and now she would like visit to discuss other issue as follows: reporting Sxs of swollen stomach all the time ordered XL clothes, wears depends ordered XL in those, for a year or more. Went to GI. Chronic constipation, bowels not moving daily of small stool and soft stool mixed, causing stomach to bloat, had large clean out bowel movement over the w/e. Pain in left lower abdomen sometimes not today. Tylenol takes away. No pain over last week. 05/2019 xray shows constipation. She admits no sxs today wanted opinion. She admits to non compliance of daily medications stating when she sees soft stool although mixed with hard small formed stool which confuses her so she stops taking or when she is going to leave home so she doesn't have BM.  No cp, ct, fever, chills, nausea, vomiting, abd pain, diarrhea.   Visit time spent discussing compliance with medication to treat chronic constipation.    Observations/Objective: A/ox4, no harsh cough, able to speak full sentences Past Medical History:  Diagnosis Date  . Arthritis    "hands, feet" (02/17/2016)  . Atrial fibrillation ([place])   . CAD (coronary artery disease)    a. 05/2016:  cath showing nonobstructive CAD with 50% prox-Cx stenosis  . CKD (chronic kidney disease) stage 3, GFR 30-59 ml/min 12/25/2012  . COPD (chronic obstructive pulmonary disease) ([place])   . Diastolic heart failure ([place])   . Hyperlipidemia   . Hypertension   . Osteoporosis   . Pneumonia    "several times" (02/17/2016)  . Small bowel obstruction ([place]) 02/17/2016  . Tricuspid regurgitation    Echo 05/02/06-Nml LV. Mild MR. Mod TR  . Type II diabetes mellitus ([place])   . Vitamin D deficiency    Past Surgical History:  Procedure Laterality Date  . APPENDECTOMY    . HEART CHAMBER REVISION     patched hole --[place]  . LAPAROSCOPIC APPENDECTOMY N/A 09/27/2013   Procedure: APPENDECTOMY - CONVERTED FROM LAPAROSCOPIC;  Surgeon: Donnie Mesa, MD;  Location: [place];  Service: General;  Laterality: N/A;  . RIGHT/LEFT HEART CATH AND CORONARY ANGIOGRAPHY N/A 05/24/2016   Procedure: Right/Left Heart Cath and Coronary Angiography;  Surgeon: Larey Dresser, MD;  Location: [place] [facility];  Service: Cardiovascular;  Laterality: N/A;  . TUBAL LIGATION     Current Outpatient Medications on File Prior to Visit  Medication Sig Dispense Refill  . ACCU-CHEK AVIVA PLUS test strip CHECK BLOOD SUGAR 3 TIMES  DAILY BEFORE MEALS AND AT  BEDTIME. 400 strip 3  . Accu-Chek Softclix Lancets lancets CHECK BLOOD SUGAR 3 TIMES  DAILY BEFORE MEALS AND AT  BEDTIME 400 each 3  . amLODipine (NORVASC) 10 MG tablet TAKE ONE-HALF TABLET BY  MOUTH  DAILY 45 tablet 3  . Blood Glucose Monitoring Suppl (ACCU-CHEK AVIVA PLUS) w/Device KIT USE TO CHECK BLOOD SUGAR 4  TIMES DAILY BEFORE MEALS  AND AT BEDTIME. Dx: E11.65. 1 kit 1  . calcitRIOL (ROCALTROL) 0.25 MCG capsule Take 1 capsule (0.25 mcg total) by mouth every Monday, Wednesday, and Friday. 90 capsule 1  . carvedilol (COREG) 12.5 MG tablet TAKE 1 TABLET BY MOUTH TWO  TIMES DAILY 180 tablet 3  . Cholecalciferol (VITAMIN D) 2000 UNITS CAPS Take 2,000 Units by mouth daily.     .  cloNIDine (CATAPRES) 0.3 MG tablet TAKE 1 TABLET BY MOUTH TWO  TIMES DAILY 180 tablet 3  . dicyclomine (BENTYL) 20 MG tablet Take 1 tablet (20 mg total) by mouth every 6 (six) hours as needed for spasms (intestinal spasms). 30 tablet 0  . febuxostat (ULORIC) 40 MG tablet TAKE 1 TABLET BY MOUTH  DAILY 90 tablet 3  . fluticasone (FLONASE) 50 MCG/ACT nasal spray Place 2 sprays into both nostrils daily. 16 g 6  . Fluticasone-Umeclidin-Vilant (TRELEGY ELLIPTA) 100-62.5-25 MCG/INH AEPB Inhale 1 Inhaler into the lungs daily. 1 each 11  . furosemide (LASIX) 40 MG tablet TAKE 1 AND 1/2 TABLETS  ($RemoveB'60MG'pQDnSSrl$ ) BY MOUTH DAILY (Patient taking differently: 40 mg daily. ) 135 tablet 3  . gabapentin (NEURONTIN) 100 MG capsule TAKE 1 TO 2 CAPSULES BY  MOUTH AT BEDTIME FOR  PERIPHERAL NEUROPATHY. 180 capsule 3  . HUMALOG KWIKPEN 100 UNIT/ML KwikPen INJECT 0.07 MLS (7 UNITS  TOTAL) INTO THE SKIN 3  (THREE) TIMES DAILY. (Patient taking differently: Inject 7 Units into the skin 2 (two) times daily. ) 30 mL 3  . insulin detemir (LEVEMIR FLEXTOUCH) 100 UNIT/ML FlexPen Inject 20 Units into the skin at bedtime. SUBCUTANEOUSLY ONCE DAILY AT  10  PM 45 mL 3  . Insulin Pen Needle (PEN NEEDLES) 31G X 6 MM MISC Use as directed to inject insulin SQ TID. 300 each 11  . ipratropium-albuterol (DUONEB) 0.5-2.5 (3) MG/3ML SOLN Take 3 mLs by nebulization every 6 (six) hours as needed. 360 mL 5  . lubiprostone (AMITIZA) 24 MCG capsule TAKE 1 CAPSULE BY MOUTH TWICE DAILY WITH A MEAL 180 capsule 3  . omeprazole (PRILOSEC) 20 MG capsule TAKE 1 CAPSULE BY MOUTH  DAILY 90 capsule 3  . polyethylene glycol (MIRALAX) packet Take 17 g by mouth daily. Hold for diarrhea. 28 each 1  . psyllium (HYDROCIL/METAMUCIL) 95 % PACK Take 1 packet by mouth 3 (three) times daily. 56 each 0  . simvastatin (ZOCOR) 20 MG tablet TAKE 1 TABLET BY MOUTH ONCE DAILY AT BEDTIME 90 tablet 3  . XARELTO 15 MG TABS tablet TAKE 1 TABLET BY MOUTH  DAILY 90 tablet 3  .  acetaminophen (TYLENOL) 500 MG tablet Take 500 mg by mouth every 6 (six) hours as needed for pain.    Marland Kitchen ondansetron (ZOFRAN ODT) 4 MG disintegrating tablet Take 1 tablet (4 mg total) by mouth every 8 (eight) hours as needed. (Patient not taking: Reported on 07/08/2019) 10 tablet 1   No current facility-administered medications on file prior to visit.   Allergies  Allergen Reactions  . Ace Inhibitors Other (See Comments)    Hyperkalemia  . Actos [Pioglitazone] Swelling  . Aspirin Other (See Comments)    G.I. Upset. In high doses  . Metformin And Related Diarrhea  . Eliquis [Apixaban] Rash and Other (See Comments)    Patient states that this med was d/ced by prescriber due to interference  with allergies and states that she also had a breakout   Immunization History  Administered Date(s) Administered  . Fluad Quad(high Dose 65+) 10/11/2018  . Influenza Whole 11/30/2009  . Influenza, High Dose Seasonal PF 10/23/2017  . Influenza,inj,Quad PF,6+ Mos 12/16/2012, 10/03/2013, 10/05/2014, 10/18/2015, 09/20/2016  . Janssen (J&J) SARS-COV-2 Vaccination 07/09/2019  . Pneumococcal Conjugate-13 12/25/2012  . Pneumococcal Polysaccharide-23 11/30/2009  . Tdap 12/25/2012     Assessment and Plan: Constipation, chronic  Non compliance w medication regimen Take/adhere to  medications as prescribed For left lower abdomen pain seek medical attention Drink plenty of fluids Follow a diet rich in fiber Get some physical activity every day Contact your healthcare:  You have rectal bleeding or you pass blood clots.  You have severe rectal pain.  You have body tissue that pushes out (protrudes) from your anus.  You have severe pain or bloating (distension) in your abdomen.  You have vomiting that you cannot control.   Follow Up Instructions:    I discussed the assessment and treatment plan with the patient. The patient was provided an opportunity to ask questions and all were answered. The  patient agreed with the plan and demonstrated an understanding of the instructions.   The patient was advised to call back or seek an in-person evaluation if the symptoms worsen or if the condition fails to improve as anticipated.  I provided 15 minutes of non-face-to-face time during this encounter. 09:45AM  Annie Main, FNP

## 2019-10-01 NOTE — Progress Notes (Signed)
Cardiology Office Note   Date:  10/02/2019   ID:  Laura Best, DOB 1940/05/27, MRN [id number]  PCP:  Susy Frizzle, MD  Cardiologist:  Dr. Harl Bowie    Chief Complaint  Patient presents with  . Atrial Fibrillation   1 yr visit   History of Present Illness: Laura Best is a 79 y.o. female who presents for chronic diastolic HF, atrial fib, dx in 2018, HTN stable, ASD repair with no shunt and stable on echo in 2019  pulmonary venous hypertension.  CKD-4, and HLD.  Today  She feels well, no chest pain, she has some chronic dyspnea on exertion but that has not changed.  She has had her COVID vaccine.  No bleeding on xarelto.  Takes her meds appropriate.  Has been living with her son and his family sine 2000.  Does not use much salt some lower ext edema at times.  We discussed support stockings.   Past Medical History:  Diagnosis Date  . Arthritis    "hands, feet" (02/17/2016)  . Atrial fibrillation ([place])   . CAD (coronary artery disease)    a. 05/2016: cath showing nonobstructive CAD with 50% prox-Cx stenosis  . CKD (chronic kidney disease) stage 3, GFR 30-59 ml/min 12/25/2012  . COPD (chronic obstructive pulmonary disease) ([place])   . Diastolic heart failure ([place])   . Hyperlipidemia   . Hypertension   . Osteoporosis   . Pneumonia    "several times" (02/17/2016)  . Small bowel obstruction ([place]) 02/17/2016  . Tricuspid regurgitation    Echo 05/02/06-Nml LV. Mild MR. Mod TR  . Type II diabetes mellitus ([place])   . Vitamin D deficiency     Past Surgical History:  Procedure Laterality Date  . APPENDECTOMY    . HEART CHAMBER REVISION     patched hole --[place]  . LAPAROSCOPIC APPENDECTOMY N/A 09/27/2013   Procedure: APPENDECTOMY - CONVERTED FROM LAPAROSCOPIC;  Surgeon: Donnie Mesa, MD;  Location: [place];  Service: General;  Laterality: N/A;  . RIGHT/LEFT HEART CATH AND CORONARY ANGIOGRAPHY N/A 05/24/2016   Procedure: Right/Left Heart Cath and Coronary Angiography;  Surgeon:  Larey Dresser, MD;  Location: [place] [facility];  Service: Cardiovascular;  Laterality: N/A;  . TUBAL LIGATION       Current Outpatient Medications  Medication Sig Dispense Refill  . ACCU-CHEK AVIVA PLUS test strip CHECK BLOOD SUGAR 3 TIMES  DAILY BEFORE MEALS AND AT  BEDTIME. 400 strip 3  . Accu-Chek Softclix Lancets lancets CHECK BLOOD SUGAR 3 TIMES  DAILY BEFORE MEALS AND AT  BEDTIME 400 each 3  . acetaminophen (TYLENOL) 500 MG tablet Take 500 mg by mouth every 6 (six) hours as needed for pain.    Marland Kitchen amLODipine (NORVASC) 10 MG tablet TAKE ONE-HALF TABLET BY  MOUTH DAILY 45 tablet 3  . Blood Glucose Monitoring Suppl (ACCU-CHEK AVIVA PLUS) w/Device KIT USE TO CHECK BLOOD SUGAR 4  TIMES DAILY BEFORE MEALS  AND AT BEDTIME. Dx: E11.65. 1 kit 1  . calcitRIOL (ROCALTROL) 0.25 MCG capsule Take 1 capsule (0.25 mcg total) by mouth every Monday, Wednesday, and Friday. 90 capsule 1  . carvedilol (COREG) 12.5 MG tablet TAKE 1 TABLET BY MOUTH TWO  TIMES DAILY 180 tablet 3  . Cholecalciferol (VITAMIN D) 2000 UNITS CAPS Take 2,000 Units by mouth daily.     . cloNIDine (CATAPRES) 0.3 MG tablet TAKE 1 TABLET BY MOUTH TWO  TIMES DAILY 180 tablet 3  . dicyclomine (BENTYL)  20 MG tablet Take 1 tablet (20 mg total) by mouth every 6 (six) hours as needed for spasms (intestinal spasms). 30 tablet 0  . febuxostat (ULORIC) 40 MG tablet TAKE 1 TABLET BY MOUTH  DAILY 90 tablet 3  . fluticasone (FLONASE) 50 MCG/ACT nasal spray Place 2 sprays into both nostrils daily. 16 g 6  . Fluticasone-Umeclidin-Vilant (TRELEGY ELLIPTA) 100-62.5-25 MCG/INH AEPB Inhale 1 Inhaler into the lungs daily. 1 each 11  . furosemide (LASIX) 40 MG tablet TAKE 1 AND 1/2 TABLETS  ($RemoveB'60MG'auzmcJbo$ ) BY MOUTH DAILY (Patient taking differently: 40 mg daily. ) 135 tablet 3  . gabapentin (NEURONTIN) 100 MG capsule TAKE 1 TO 2 CAPSULES BY  MOUTH AT BEDTIME FOR  PERIPHERAL NEUROPATHY. 180 capsule 3  . HUMALOG KWIKPEN 100 UNIT/ML KwikPen INJECT 0.07 MLS (7 UNITS   TOTAL) INTO THE SKIN 3  (THREE) TIMES DAILY. (Patient taking differently: Inject 7 Units into the skin 2 (two) times daily. ) 30 mL 3  . insulin detemir (LEVEMIR FLEXTOUCH) 100 UNIT/ML FlexPen Inject 20 Units into the skin at bedtime. SUBCUTANEOUSLY ONCE DAILY AT  10  PM 45 mL 3  . Insulin Pen Needle (PEN NEEDLES) 31G X 6 MM MISC Use as directed to inject insulin SQ TID. 300 each 11  . ipratropium-albuterol (DUONEB) 0.5-2.5 (3) MG/3ML SOLN Take 3 mLs by nebulization every 6 (six) hours as needed. 360 mL 5  . lubiprostone (AMITIZA) 24 MCG capsule TAKE 1 CAPSULE BY MOUTH TWICE DAILY WITH A MEAL 180 capsule 3  . omeprazole (PRILOSEC) 20 MG capsule TAKE 1 CAPSULE BY MOUTH  DAILY 90 capsule 3  . ondansetron (ZOFRAN ODT) 4 MG disintegrating tablet Take 1 tablet (4 mg total) by mouth every 8 (eight) hours as needed. 10 tablet 1  . polyethylene glycol (MIRALAX) packet Take 17 g by mouth daily. Hold for diarrhea. 28 each 1  . psyllium (HYDROCIL/METAMUCIL) 95 % PACK Take 1 packet by mouth 3 (three) times daily. 56 each 0  . simvastatin (ZOCOR) 20 MG tablet TAKE 1 TABLET BY MOUTH ONCE DAILY AT BEDTIME 90 tablet 3  . XARELTO 15 MG TABS tablet TAKE 1 TABLET BY MOUTH  DAILY 90 tablet 3   No current facility-administered medications for this visit.    Allergies:   Ace inhibitors, Actos [pioglitazone], Aspirin, Metformin and related, and Eliquis [apixaban]    Social History:  The patient  reports that she quit smoking about 10 years ago. Her smoking use included cigarettes. She has a 25.00 pack-year smoking history. She has never used smokeless tobacco. She reports that she does not drink alcohol and does not use drugs.   Family History:  The patient's family history includes Cancer in her brother, father, and sister; Diabetes in her father.    ROS:  General:no colds or fevers, no weight changes Skin:no rashes or ulcers HEENT:no blurred vision, no congestion CV:see HPI PUL:see HPI GI:no diarrhea  constipation or melena, no indigestion GU:no hematuria, no dysuria MS:no joint pain, no claudication Neuro:no syncope, no lightheadedness Endo:+ diabetes, no thyroid disease  Wt Readings from Last 3 Encounters:  10/02/19 178 lb 9.6 oz (81 kg)  07/07/19 176 lb (79.8 kg)  05/09/19 174 lb (78.9 kg)     PHYSICAL EXAM: VS:  BP 126/64   Pulse 74   Ht $R'5\' 3"'jc$  (1.6 m)   Wt 178 lb 9.6 oz (81 kg)   SpO2 94%   BMI 31.64 kg/m  , BMI Body mass index is 31.64 kg/m. General:Pleasant  affect, NAD Skin:Warm and dry, brisk capillary refill HEENT:normocephalic, sclera clear, mucus membranes moist Neck:supple, no JVD, no bruits  Heart:S1S2 RRR without murmur, gallup, rub or click Lungs:clear without rales, rhonchi, or wheezes XIP:JASN, non tender, + BS, do not palpate liver spleen or masses Ext:no lower ext edema, 2+ pedal pulses, 2+ radial pulses Neuro:alert and oriented, MAE, follows commands, + facial symmetry    EKG:  EKG is ordered today. The ekg ordered today demonstrates atrial fib with HR 71 low voltage and LBBB but no acute changes from prior.  Stable EKG   Recent Labs: 07/07/2019: ALT 6; BUN 36; Creat 1.80; Hemoglobin 11.6; Platelets 211; Potassium 4.8; Sodium 140    Lipid Panel    Component Value Date/Time   CHOL 116 07/07/2019 1025   TRIG 108 07/07/2019 1025   HDL 48 (L) 07/07/2019 1025   CHOLHDL 2.4 07/07/2019 1025   VLDL 42 (H) 09/20/2016 1123   LDLCALC 49 07/07/2019 1025       Other studies Reviewed: Additional studies/ records that were reviewed today include: .  Echo 10/22/17 Study Conclusions   - Left ventricle: The cavity size was normal. Wall thickness was  increased increased in a pattern of mild to moderate LVH.  Systolic function was normal. The estimated ejection fraction was  in the range of 55% to 60%. Wall motion was normal; there were no  regional wall motion abnormalities. The study is not technically  sufficient to allow evaluation of LV  diastolic function.  - Aortic valve: Mildly calcified annulus. Trileaflet; mildly  thickened leaflets. Valve area (VTI): 2.42 cm^2. Valve area  (Vmax): 2.42 cm^2.  - Mitral valve: Mildly calcified annulus. Mildly thickened leaflets  .  - Left atrium: The atrium was severely dilated.  - Right ventricle: The cavity size was mildly dilated. Systolic  function was mildly reduced.  - Right atrium: The atrium was severely dilated.  - Atrial septum: No defect or patent foramen ovale was identified.  - Pulmonary arteries: Systolic pressure was mildly increased. PA  peak pressure: 33 mm Hg (S).  - Technicallya adequate study.   ASSESSMENT AND PLAN:  1.  Atrial fib rate controlled.  On anticoagulation with xarelto with no bleeding issues. Continue current meds.  2.  ASD repair, no murmurs and no change in chronic SOB.  We discussed if dyspnea increases to call us.     3.  Pulmonary venous hypertension.    4  CAD with 50% in prox to mid LCX in 2018.   5.  HLD followed by PCP last LDL 07/07/19 was 49 and HDL 48.    6.  CKD-4 plans to see nephrology.   7.  HTN controlled  Unless dyspnea increases then follow up with Dr. Harl Bowie in 1 year.   Current medicines are reviewed with the patient today.  The patient Has no concerns regarding medicines.  The following changes have been made:  See above Labs/ tests ordered today include:see above  Disposition:   FU:  see above  Signed, Cecilie Kicks, NP  10/02/2019 1:07 PM    [facility] [place], [place] [address], [place], [place] Phone: [phone]; Fax: [phone]

## 2019-10-02 ENCOUNTER — Other Ambulatory Visit: Payer: Self-pay

## 2019-10-02 ENCOUNTER — Ambulatory Visit (INDEPENDENT_AMBULATORY_CARE_PROVIDER_SITE_OTHER): Payer: Medicare Other | Admitting: Cardiology

## 2019-10-02 ENCOUNTER — Encounter: Payer: Self-pay | Admitting: Cardiology

## 2019-10-02 VITALS — BP 126/64 | HR 74 | Ht 63.0 in | Wt 178.6 lb

## 2019-10-02 DIAGNOSIS — Z7901 Long term (current) use of anticoagulants: Secondary | ICD-10-CM

## 2019-10-02 DIAGNOSIS — N184 Chronic kidney disease, stage 4 (severe): Secondary | ICD-10-CM | POA: Diagnosis not present

## 2019-10-02 DIAGNOSIS — I2510 Atherosclerotic heart disease of native coronary artery without angina pectoris: Secondary | ICD-10-CM

## 2019-10-02 DIAGNOSIS — I10 Essential (primary) hypertension: Secondary | ICD-10-CM

## 2019-10-02 DIAGNOSIS — I482 Chronic atrial fibrillation, unspecified: Secondary | ICD-10-CM | POA: Diagnosis not present

## 2019-10-02 DIAGNOSIS — Z8774 Personal history of (corrected) congenital malformations of heart and circulatory system: Secondary | ICD-10-CM

## 2019-10-02 NOTE — Patient Instructions (Signed)
Medication Instructions:  Your physician recommends that you continue on your current medications as directed. Please refer to the Current Medication list given to you today.  *If you need a refill on your cardiac medications before your next appointment, please call your pharmacy*   Lab Work: NONE   If you have labs (blood work) drawn today and your tests are completely normal, you will receive your results only by: . MyChart Message (if you have MyChart) OR . A paper copy in the mail If you have any lab test that is abnormal or we need to change your treatment, we will call you to review the results.   Testing/Procedures: NONE    Follow-Up: At [facility], you and your health needs are our priority.  As part of our continuing mission to provide you with exceptional heart care, we have created designated Provider Care Teams.  These Care Teams include your primary Cardiologist (physician) and Advanced Practice Providers (APPs -  Physician Assistants and Nurse Practitioners) who all work together to provide you with the care you need, when you need it.  We recommend signing up for the patient portal called "MyChart".  Sign up information is provided on this After Visit Summary.  MyChart is used to connect with patients for Virtual Visits (Telemedicine).  Patients are able to view lab/test results, encounter notes, upcoming appointments, etc.  Non-urgent messages can be sent to your provider as well.   To learn more about what you can do with MyChart, go to [email or web address].    Your next appointment:   1 year(s)  The format for your next appointment:   In Person  Provider:   Jonathan Branch, MD   Other Instructions Thank you for choosing [facility]!    

## 2019-10-20 ENCOUNTER — Other Ambulatory Visit: Payer: Self-pay | Admitting: Family Medicine

## 2019-11-10 ENCOUNTER — Other Ambulatory Visit: Payer: Self-pay

## 2019-11-10 MED ORDER — ACCU-CHEK SOFTCLIX LANCETS MISC: 3 refills | Status: DC

## 2019-11-10 NOTE — Telephone Encounter (Signed)
Pt is needing refill on Accu-Chek Softclix Lancets lancets  Send to [place], [place] [address], [address]   Pt call back [phone]

## 2019-11-10 NOTE — Telephone Encounter (Signed)
Prescription sent to pharmacy.

## 2019-11-15 ENCOUNTER — Telehealth: Payer: Self-pay | Admitting: Family Medicine

## 2019-11-15 NOTE — Telephone Encounter (Signed)
Pharmacy:[facility]  Medication:ACCU-CHEK AVIVA PLUS test strip  Qty:400 strip  KJI:ZXYOF BLOOD SUGAR 3 TIMES DAILY BEFORE MEALS AND AT BEDTIME.

## 2019-11-17 ENCOUNTER — Other Ambulatory Visit: Payer: Self-pay

## 2019-11-17 MED ORDER — ACCU-CHEK AVIVA PLUS VI STRP
ORAL_STRIP | 3 refills | Status: DC
Start: 2019-11-17 — End: 2019-11-19

## 2019-11-19 ENCOUNTER — Other Ambulatory Visit: Payer: Self-pay

## 2019-11-19 MED ORDER — ACCU-CHEK AVIVA PLUS VI STRP
ORAL_STRIP | 3 refills | Status: DC
Start: 2019-11-19 — End: 2019-11-21

## 2019-11-21 ENCOUNTER — Other Ambulatory Visit: Payer: Self-pay

## 2019-11-21 MED ORDER — ACCU-CHEK AVIVA PLUS W/DEVICE KIT
PACK | 1 refills | Status: DC
Start: 2019-11-21 — End: 2019-11-21

## 2019-11-21 MED ORDER — ACCU-CHEK AVIVA PLUS W/DEVICE KIT
PACK | 1 refills | Status: DC
Start: 2019-11-21 — End: 2019-12-03

## 2019-11-21 MED ORDER — ACCU-CHEK AVIVA PLUS VI STRP: ORAL_STRIP | 3 refills | Status: DC

## 2019-11-21 NOTE — Telephone Encounter (Signed)
Pharmacy refill request

## 2019-11-21 NOTE — Telephone Encounter (Signed)
Pt need her blood sugar meter Accu- Check aziza rx sent to the pharmacy     [place], [place], [address]

## 2019-12-03 ENCOUNTER — Other Ambulatory Visit: Payer: Self-pay

## 2019-12-03 ENCOUNTER — Ambulatory Visit (INDEPENDENT_AMBULATORY_CARE_PROVIDER_SITE_OTHER): Payer: Medicare Other

## 2019-12-03 DIAGNOSIS — Z23 Encounter for immunization: Secondary | ICD-10-CM | POA: Diagnosis not present

## 2019-12-03 MED ORDER — ACCU-CHEK AVIVA PLUS W/DEVICE KIT: PACK | 1 refills | Status: DC

## 2019-12-08 ENCOUNTER — Other Ambulatory Visit: Payer: Self-pay | Admitting: Family Medicine

## 2019-12-08 ENCOUNTER — Other Ambulatory Visit: Payer: Self-pay

## 2019-12-08 DIAGNOSIS — E1165 Type 2 diabetes mellitus with hyperglycemia: Secondary | ICD-10-CM

## 2019-12-08 DIAGNOSIS — E1121 Type 2 diabetes mellitus with diabetic nephropathy: Secondary | ICD-10-CM

## 2019-12-08 MED ORDER — BLOOD GLUCOSE TEST VI STRP: ORAL_STRIP | 3 refills | Status: DC

## 2019-12-08 MED ORDER — ACCU-CHEK AVIVA PLUS VI STRP: ORAL_STRIP | 3 refills | Status: DC

## 2019-12-08 MED ORDER — ACCU-CHEK SOFTCLIX LANCETS MISC: 3 refills | Status: DC

## 2019-12-08 NOTE — Telephone Encounter (Signed)
Pt rx sent to pharmacy

## 2019-12-08 NOTE — Telephone Encounter (Signed)
Pt received her glucose machine which was Accu-CHK Guide ME KIT but no strips can she get Rx for those strips for this machine

## 2019-12-08 NOTE — Telephone Encounter (Signed)
Prescription sent to pharmacy.

## 2019-12-15 ENCOUNTER — Other Ambulatory Visit: Payer: Self-pay | Admitting: Family Medicine

## 2019-12-16 ENCOUNTER — Other Ambulatory Visit: Payer: Self-pay | Admitting: Family Medicine

## 2019-12-16 DIAGNOSIS — E1142 Type 2 diabetes mellitus with diabetic polyneuropathy: Secondary | ICD-10-CM

## 2020-01-02 NOTE — Chronic Care Management (AMB) (Signed)
Chronic Care Management Pharmacy  Name: Laura Best  MRN: [id number] DOB: 1940/08/22  Chief Complaint/ HPI  Laura Best,  79 y.o. , female presents for their Follow-Up CCM visit with the clinical pharmacist via telephone.  PCP : Laura Frizzle, MD  Their chronic conditions include:  Hypertension, CHF, Afib, COPD, GERD, Type II DM, CKD, osteoporosis, hyperlipidemia,.  Office Visits: 01/06/2020 Laura Best) - patient brings in blood sugar logs, concerning for a few morning readings in the 70s.  Levemir was reduced to 15 units to reduce this risk, continue same dose of Humalog.  No other changes made, patient appears to be doing very well overall.  05/09/2019 (Laura Best) - bloating and mild pain hours after eating, normal abdominal exam, started on Bentyl 53m q6h, if no improvement recommends ultrasound  03/17/2019 (Laura Best) - LDL cholesterol goal well below 100.  Kidney function has declined, decreased Lasix to 481mdaily with recheck in 4 months.  Consult Visit:  10/02/2019 (Laura Best) - General afib follow up, denies bleeding, regular follow up in one year unless she experiences dyspnea.  No medication changes at this visit.  Medications: Outpatient Encounter Medications as of 01/13/2020  Medication Sig  . HUMALOG KWIKPEN 100 UNIT/ML KwikPen INJECT 0.07 MLS (7 UNITS  TOTAL) INTO THE SKIN 3  (THREE) TIMES DAILY. (Patient taking differently: Inject 7 Units into the skin 2 (two) times daily.)  . insulin detemir (LEVEMIR FLEXTOUCH) 100 UNIT/ML FlexPen Inject 20 Units into the skin at bedtime. SUBCUTANEOUSLY ONCE DAILY AT  10  PM  . Accu-Chek Softclix Lancets lancets CHECK BLOOD SUGAR 3 TIMES  DAILY BEFORE MEALS AND AT  BEDTIME  . acetaminophen (TYLENOL) 500 MG tablet Take 500 mg by mouth every 6 (six) hours as needed for pain.  . Marland KitchenmLODipine (NORVASC) 10 MG tablet TAKE ONE-HALF TABLET BY  MOUTH DAILY  . Blood Glucose Monitoring Suppl (ACCU-CHEK AVIVA PLUS) w/Device KIT USE TO  CHECK BLOOD SUGAR 4  TIMES DAILY BEFORE MEALS  AND AT BEDTIME. Dx: E11.65.  . calcitRIOL (ROCALTROL) 0.25 MCG capsule Take 1 capsule (0.25 mcg total) by mouth every Monday, Wednesday, and Friday.  . carvedilol (COREG) 12.5 MG tablet TAKE 1 TABLET BY MOUTH  TWICE DAILY  . Cholecalciferol (VITAMIN D) 2000 UNITS CAPS Take 2,000 Units by mouth daily.   . cloNIDine (CATAPRES) 0.3 MG tablet TAKE 1 TABLET BY MOUTH  TWICE DAILY  . dicyclomine (BENTYL) 20 MG tablet Take 1 tablet (20 mg total) by mouth every 6 (six) hours as needed for spasms (intestinal spasms).  . febuxostat (ULORIC) 40 MG tablet TAKE 1 TABLET BY MOUTH  DAILY  . fluticasone (FLONASE) 50 MCG/ACT nasal spray Place 2 sprays into both nostrils daily.  . Fluticasone-Umeclidin-Vilant (TRELEGY ELLIPTA) 100-62.5-25 MCG/INH AEPB Inhale 1 Inhaler into the lungs daily.  . furosemide (LASIX) 40 MG tablet TAKE 1 AND 1/2 TABLETS BY  MOUTH DAILY  . gabapentin (NEURONTIN) 100 MG capsule TAKE 1 TO 2 CAPSULES BY  MOUTH AT BEDTIME FOR  PERIPHERAL NEUROPATHY  . glucose blood (ACCU-CHEK AVIVA PLUS) test strip CHECK BLOOD SUGAR 3 TIMES  DAILY BEFORE MEALS AND AT  BEDTIME.  . Marland Kitchenlucose Blood (BLOOD GLUCOSE TEST STRIPS) STRP Please dispense as Accu-Chek Guide Me. Use as directed to monitor FSBS 3x daily. Dx: E11.9.  . Insulin Pen Needle (PEN NEEDLES) 31G X 6 MM MISC Use as directed to inject insulin SQ TID.  . Marland Kitchenpratropium-albuterol (DUONEB) 0.5-2.5 (3) MG/3ML SOLN Take 3 mLs by nebulization every 6 (  six) hours as needed.  . lubiprostone (AMITIZA) 24 MCG capsule TAKE 1 CAPSULE BY MOUTH  TWICE DAILY WITH A MEAL  . omeprazole (PRILOSEC) 20 MG capsule TAKE 1 CAPSULE BY MOUTH  DAILY  . ondansetron (ZOFRAN ODT) 4 MG disintegrating tablet Take 1 tablet (4 mg total) by mouth every 8 (eight) hours as needed.  . polyethylene glycol (MIRALAX) packet Take 17 g by mouth daily. Hold for diarrhea.  . psyllium (HYDROCIL/METAMUCIL) 95 % PACK Take 1 packet by mouth 3 (three) times  daily.  . simvastatin (ZOCOR) 20 MG tablet TAKE 1 TABLET BY MOUTH ONCE DAILY AT BEDTIME  . XARELTO 15 MG TABS tablet TAKE 1 TABLET BY MOUTH  DAILY   No facility-administered encounter medications on file as of 01/13/2020.     Current Diagnosis/Assessment:  Laura Best Strain: Low Risk   . Difficulty of Paying Living Expenses: Not very hard     Goals Addressed            This Visit's Progress   . Pharmacy Care Plan:       CARE PLAN ENTRY  Current Barriers:  . Chronic Disease Management support, education, and care coordination needs related to Hypertension, Hyperlipidemia, Diabetes, and Chronic Kidney Disease   Hypertension . Pharmacist Clinical Goal(s): o Over the next 120 days, patient will work with PharmD and providers to maintain BP goal <130/80 . Current regimen:  o Amlodipine 427m daily o Carvedilol 12.535mtwice daily o Clonidine 0.27m41maily . Interventions: o Comprehensive medication review o Reviewed home blood pressure monitoring o Continued current therapy . Patient self care activities - Over the next 120 days, patient will: o Check BP twice daily, document, and provide at future appointments o Ensure daily salt intake < 2300 mg/day o Contact PharmD or PCP with BP consistently > 130/80  Hyperlipidemia . Pharmacist Clinical Goal(s): o Over the next 120 days, patient will work with PharmD and providers to maintain LDL goal < 100 . Current regimen:  o Simvastatin 24m2mInterventions: o Discussed current dietary changes o Reviewed most recent lab results . Patient self care activities - Over the next 120 days, patient will: o Continue to focus on medication adherence by pill box o Continue changes made to diet while ensuring to eat balanced meals throughout the day without skipping  Diabetes . Pharmacist Clinical Goal(s): o Over the next 120 days, patient will work with PharmD and providers to achieve A1c goal <7% . Current regimen:  o Levemir  100units/ml 20 units hs o Humalog 7 units twice daily . Interventions: o Reviewed home monitoring logs, assessed for risk of hypoglycemia o Discussed ways to recognize and treat hypoglycemia o Counseled on eating regular meals when taking insulin  . Patient self care activities - Over the next 120 days, patient will: o Check blood sugar 3-4 times daily, document, and provide at future appointments o Contact provider with any episodes of hypoglycemia o Remember to eat around all doses of insulin to avoid hypoglycemia  CKD . Pharmacist Clinical Goal(s) o Over the next 120 days, patient will work with PharmD and providers to optimize medication regimen based on current kidney function. . Interventions: o Reviewed all meds for safety based on kidney function. o Counseled on avoidance of NSAIDs for pain/inflammation. . Patient self care activities - Over the next 120 days, patient will: o Continue to take medications as directed. o Contact PharmD or PCP with any medication related questions or concerns.    Please see  past updates related to this goal by clicking on the "Past Updates" button in the selected goal          AFIB   Patient is currently rate controlled.   Patient has failed these meds in past: none noted Patient is currently controlled on the following medications:   Carvedilol 12.77m twice daily  Xarelto 171mdaily  We discussed:  Denies abnormal bruising or bleeding  Xarelto copay is affordable as patient does not have to pay anything for her medications  Anticoagulated appropriately   Plan  Continue current medications.   COPD   Eosinophil count:   Lab Results  Component Value Date/Time   EOSPCT 2.7 01/06/2020 11:04 AM  %                               Eos (Absolute):  Lab Results  Component Value Date/Time   EOSABS 159 01/06/2020 11:04 AM    Tobacco Status:  Social History   Tobacco Use  Smoking Status Former Smoker  . Packs/day: 0.50   . Years: 50.00  . Pack years: 25.00  . Types: Cigarettes  . Quit date: 01/19/2009  . Years since quitting: 10.9  Smokeless Tobacco Never Used    Patient has failed these meds in past: Combivent Patient is currently controlled on the following medications:   Trelegy ellipta  Duoneb inhaler Using maintenance inhaler regularly? No Frequency of rescue inhaler use:  infrequently  We discussed:  Again, reminded her that Trelegy was a daily maintenance inhaler that she was to use daily, Duoneb was to be used as her rescue inhaler.  She denies any SOB only with extreme exertion in cold weather.    Trelegy copay affordable as described above.    She has had no recent exacerbations and no limitations to her ADLs.  Plan  Continue current medications.  Remember to use Duonebs prn when SOB.   Hyperlipidemia   Lipid Panel     Component Value Date/Time   CHOL 123 01/06/2020 1104   TRIG 99 01/06/2020 1104   HDL 53 01/06/2020 1104   LDLCALC 52 01/06/2020 1104     The ASCVD Risk score (Goff DC Jr., et al., 2013) failed to calculate for the following reasons:   The valid total cholesterol range is 130 to 320 mg/dL   Patient has failed these meds in past: none noted Patient is currently controlled on the following medications:  . Simvastatin 2086mWe discussed:  Very recent lipid panel was excellent  She reports adherence with her statin medication  Plan  Continue current medications   Diabetes   Recent Relevant Labs: Lab Results  Component Value Date/Time   HGBA1C 7.3 (H) 01/06/2020 11:04 AM   HGBA1C 6.9 (H) 07/07/2019 10:25 AM   HGBA1C 6.9 (H) 05/10/2017 02:35 PM   HGBA1C CANCELED 04/05/2015 11:09 AM   MICROALBUR 1.5 01/06/2020 11:04 AM   MICROALBUR 0.6 07/07/2019 10:25 AM     Checking BG:  four times daily  Recent FBG Readings: 117, 100, 93, 120, 97, 118  Patient is currently controlled on the following medications:   Levemir 100units/ml 15 units at  bedtime,   Humalog 7 units twice day lunch and supper  Last diabetic Foot exam: No results found for: HMDIABEYEEXA  Last diabetic Eye exam: No results found for: HMDIABFOOTEX   We discussed:   She is taking new dose of Levemir (15 units) as per Dr. PicDennard Best  on her last visit to reduce the risk of hypoglycemia  Her fasting sugars she gave me above look excellent since the switch, nothing less than 93 and everything is < 130.  Continue this current dose and contact providers if she experiences any symptoms of hyper or hypoglycemia.  Plan  Continue current medications.   Ensure regular meals with insulin doses.  Heart Failure   Type: Diastolic  Last ejection fraction: 55-60% NYHA Class: II (slight limitation of activity) AHA HF Stage: C (Heart disease and symptoms present)  Patient has failed these meds in past: none noted Patient is currently controlled on the following medications:   furosemide 40m daily   We discussed:  Still taking 416mdaily  She denies any swelling recently  IS monitoring her weight for excess fluid gain  Plan  Continue current medications  Contact providers with any signs of excess fluid  Hypertension   Office blood pressures are  BP Readings from Last 3 Encounters:  01/06/20 120/70  10/02/19 126/64  07/07/19 110/60   Patient has failed these meds in the past: benazepril 4070mclonidine 0.2mg56misinopril 20mg58mtient checks BP at home twice daily  Patient home BP readings are ranging: no readings provided today  Patient is currently controlled on the following medications:   amlodipine 5mg d60my  carvedilol 12.5mg tw5m daily,  clonidine 0.3mg   W84miscussed:  BP has been well controlled  Denies any dizziness or headaches  Adherent to all medication  Plan  Continue current medications      GERD    Patient has failed these meds in past: none noted Patient is currently controlled on the following medications:    omeprazole 20mg  Pa40mt takes daily with no symptoms.    Plan  Continue current medications  CKD   Kidney Function Lab Results  Component Value Date/Time   CREATININE 1.51 (H) 01/06/2020 11:04 AM   CREATININE 1.80 (H) 07/07/2019 10:25 AM   GFRNONAA 33 (L) 01/06/2020 11:04 AM   GFRAA 38 (L) 01/06/2020 11:04 AM    Stage IV kidney failure.  All medications evaluated for safety based off of most recent kidney function.  No NSAIDS  Plan  Continue current medications.    Vaccines   Reviewed and discussed patient's vaccination history.    Immunization History  Administered Date(s) Administered  . Fluad Quad(high Dose 65+) 10/11/2018, 12/03/2019  . Influenza Whole 11/30/2009  . Influenza, High Dose Seasonal PF 10/23/2017  . Influenza,inj,Quad PF,6+ Mos 12/16/2012, 10/03/2013, 10/05/2014, 10/18/2015, 09/20/2016  . Janssen (J&J) SARS-COV-2 Vaccination 07/09/2019  . Pneumococcal Conjugate-13 12/25/2012  . Pneumococcal Polysaccharide-23 11/30/2009  . Tdap 12/25/2012    Plan  Recommended patient receive shingrix vaccine in office/pharmacy.   Medication Management   . Miscellaneous medications: bentyl 20mg, feb62mtat 40mg, Amit56m24mcg . OTC39mTylenol 500mg, Mirala77mitamin D . Patient currently uses [facility] pharArt gallery [phone] using pill box method to organize medications and promote adherence. . Patient denies missed doses of medication.   Larz Mark DavBeverly Milchical Pharmacist [phone]

## 2020-01-06 ENCOUNTER — Ambulatory Visit (INDEPENDENT_AMBULATORY_CARE_PROVIDER_SITE_OTHER): Payer: Medicare Other | Admitting: Family Medicine

## 2020-01-06 ENCOUNTER — Other Ambulatory Visit: Payer: Self-pay

## 2020-01-06 VITALS — BP 120/70 | HR 94 | Temp 97.7°F | Ht 63.0 in | Wt 175.0 lb

## 2020-01-06 DIAGNOSIS — I5033 Acute on chronic diastolic (congestive) heart failure: Secondary | ICD-10-CM

## 2020-01-06 DIAGNOSIS — E785 Hyperlipidemia, unspecified: Secondary | ICD-10-CM | POA: Diagnosis not present

## 2020-01-06 DIAGNOSIS — I482 Chronic atrial fibrillation, unspecified: Secondary | ICD-10-CM | POA: Diagnosis not present

## 2020-01-06 DIAGNOSIS — E1121 Type 2 diabetes mellitus with diabetic nephropathy: Secondary | ICD-10-CM

## 2020-01-06 DIAGNOSIS — I10 Essential (primary) hypertension: Secondary | ICD-10-CM

## 2020-01-06 NOTE — Progress Notes (Signed)
Subjective:    Patient ID: Laura Best, female    DOB: 08-13-1940, 79 y.o.   MRN: [id number]  Patient is a very sweet 79 year old Caucasian female who comes in for her 14-month check.  She has a history of insulin-dependent type 2 diabetes mellitus.  She is currently on 20 units of Levemir at night and 7 units of Humalog with lunch and 7 units of Humalog with supper.  She brings in a large list of blood sugars.  She is checking it 4 times a day.  She is checking it fasting in the morning.  Her morning sugars are ranging between 70 and 104.  She is also checking it before lunch supper and bedtime.  Her lunchtime sugars are typically 104-157 on average.  Her dinnertime sugars are between 104 and 208 with the vast majority between 150 and 170.  Her bedtime sugars are between 110 and 181 with the vast majority between 130 and 150.  These look outstanding.  I am concerned a little bit about hypoglycemia in the morning if she is having several mornings when she is in the 70s.  She is in atrial fibrillation today.  She denies any palpitations or tachycardia or shortness of breath or syncope.  She denies any chest pain or angina.  She denies any swelling in her legs.  There is no pitting edema in her legs today.  She denies any wheezing or cough. Past Medical History:  Diagnosis Date  . Arthritis    "hands, feet" (02/17/2016)  . Atrial fibrillation ([place])   . CAD (coronary artery disease)    a. 05/2016: cath showing nonobstructive CAD with 50% prox-Cx stenosis  . CKD (chronic kidney disease) stage 3, GFR 30-59 ml/min 12/25/2012  . COPD (chronic obstructive pulmonary disease) ([place])   . Diastolic heart failure ([place])   . Hyperlipidemia   . Hypertension   . Osteoporosis   . Pneumonia    "several times" (02/17/2016)  . Small bowel obstruction ([place]) 02/17/2016  . Tricuspid regurgitation    Echo 05/02/06-Nml LV. Mild MR. Mod TR  . Type II diabetes mellitus ([place])   . Vitamin D deficiency    Past Surgical  History:  Procedure Laterality Date  . APPENDECTOMY    . HEART CHAMBER REVISION     patched hole --[place]  . LAPAROSCOPIC APPENDECTOMY N/A 09/27/2013   Procedure: APPENDECTOMY - CONVERTED FROM LAPAROSCOPIC;  Surgeon: Donnie Mesa, MD;  Location: [facility];  Service: General;  Laterality: N/A;  . RIGHT/LEFT HEART CATH AND CORONARY ANGIOGRAPHY N/A 05/24/2016   Procedure: Right/Left Heart Cath and Coronary Angiography;  Surgeon: Larey Dresser, MD;  Location: [facility];  Service: Cardiovascular;  Laterality: N/A;  . TUBAL LIGATION     Current Outpatient Medications on File Prior to Visit  Medication Sig Dispense Refill  . Accu-Chek Softclix Lancets lancets CHECK BLOOD SUGAR 3 TIMES  DAILY BEFORE MEALS AND AT  BEDTIME 400 each 3  . acetaminophen (TYLENOL) 500 MG tablet Take 500 mg by mouth every 6 (six) hours as needed for pain.    Marland Kitchen amLODipine (NORVASC) 10 MG tablet TAKE ONE-HALF TABLET BY  MOUTH DAILY 45 tablet 3  . Blood Glucose Monitoring Suppl (ACCU-CHEK AVIVA PLUS) w/Device KIT USE TO CHECK BLOOD SUGAR 4  TIMES DAILY BEFORE MEALS  AND AT BEDTIME. Dx: E11.65. 1 kit 1  . calcitRIOL (ROCALTROL) 0.25 MCG capsule Take 1 capsule (0.25 mcg total) by mouth every Monday, Wednesday, and Friday. [place]  capsule 1  . carvedilol (COREG) 12.5 MG tablet TAKE 1 TABLET BY MOUTH  TWICE DAILY 180 tablet 3  . Cholecalciferol (VITAMIN D) 2000 UNITS CAPS Take 2,000 Units by mouth daily.     . cloNIDine (CATAPRES) 0.3 MG tablet TAKE 1 TABLET BY MOUTH  TWICE DAILY 180 tablet 3  . dicyclomine (BENTYL) 20 MG tablet Take 1 tablet (20 mg total) by mouth every 6 (six) hours as needed for spasms (intestinal spasms). 30 tablet 0  . febuxostat (ULORIC) 40 MG tablet TAKE 1 TABLET BY MOUTH  DAILY 90 tablet 3  . fluticasone (FLONASE) 50 MCG/ACT nasal spray Place 2 sprays into both nostrils daily. 16 g 6  . Fluticasone-Umeclidin-Vilant (TRELEGY ELLIPTA) 100-62.5-25 MCG/INH AEPB Inhale 1 Inhaler into the lungs daily. 1 each  11  . furosemide (LASIX) 40 MG tablet TAKE 1 AND 1/2 TABLETS BY  MOUTH DAILY 135 tablet 3  . gabapentin (NEURONTIN) 100 MG capsule TAKE 1 TO 2 CAPSULES BY  MOUTH AT BEDTIME FOR  PERIPHERAL NEUROPATHY 180 capsule 3  . glucose blood (ACCU-CHEK AVIVA PLUS) test strip CHECK BLOOD SUGAR 3 TIMES  DAILY BEFORE MEALS AND AT  BEDTIME. 400 strip 3  . Glucose Blood (BLOOD GLUCOSE TEST STRIPS) STRP Please dispense as Accu-Chek Guide Me. Use as directed to monitor FSBS 3x daily. Dx: E11.9. 200 strip 3  . HUMALOG KWIKPEN 100 UNIT/ML KwikPen INJECT 0.07 MLS (7 UNITS  TOTAL) INTO THE SKIN 3  (THREE) TIMES DAILY. (Patient taking differently: Inject 7 Units into the skin 2 (two) times daily.) 30 mL 3  . insulin detemir (LEVEMIR FLEXTOUCH) 100 UNIT/ML FlexPen Inject 20 Units into the skin at bedtime. SUBCUTANEOUSLY ONCE DAILY AT  10  PM 45 mL 3  . Insulin Pen Needle (PEN NEEDLES) 31G X 6 MM MISC Use as directed to inject insulin SQ TID. 300 each 11  . ipratropium-albuterol (DUONEB) 0.5-2.5 (3) MG/3ML SOLN Take 3 mLs by nebulization every 6 (six) hours as needed. 360 mL 5  . lubiprostone (AMITIZA) 24 MCG capsule TAKE 1 CAPSULE BY MOUTH  TWICE DAILY WITH A MEAL 180 capsule 3  . omeprazole (PRILOSEC) 20 MG capsule TAKE 1 CAPSULE BY MOUTH  DAILY 90 capsule 3  . ondansetron (ZOFRAN ODT) 4 MG disintegrating tablet Take 1 tablet (4 mg total) by mouth every 8 (eight) hours as needed. 10 tablet 1  . polyethylene glycol (MIRALAX) packet Take 17 g by mouth daily. Hold for diarrhea. 28 each 1  . psyllium (HYDROCIL/METAMUCIL) 95 % PACK Take 1 packet by mouth 3 (three) times daily. 56 each 0  . simvastatin (ZOCOR) 20 MG tablet TAKE 1 TABLET BY MOUTH ONCE DAILY AT BEDTIME 90 tablet 3  . XARELTO 15 MG TABS tablet TAKE 1 TABLET BY MOUTH  DAILY 90 tablet 3   No current facility-administered medications on file prior to visit.    Allergies  Allergen Reactions  . Ace Inhibitors Other (See Comments)    Hyperkalemia  . Actos  [Pioglitazone] Swelling  . Aspirin Other (See Comments)    G.I. Upset. In high doses  . Metformin And Related Diarrhea  . Eliquis [Apixaban] Rash and Other (See Comments)    Patient states that this med was d/ced by prescriber due to interference with allergies and states that she also had a breakout   Social History   Socioeconomic History  . Marital status: Widowed    Spouse name: Not on file  . Number of children: Not on file  .  Years of education: Not on file  . Highest education level: Not on file  Occupational History  . Occupation: retired  Tobacco Use  . Smoking status: Former Smoker    Packs/day: 0.50    Years: 50.00    Pack years: 25.00    Types: Cigarettes    Quit date: 01/19/2009    Years since quitting: 10.9  . Smokeless tobacco: Never Used  Vaping Use  . Vaping Use: Never used  Substance and Sexual Activity  . Alcohol use: No    Alcohol/week: 0.0 standard drinks  . Drug use: No  . Sexual activity: Never  Other Topics Concern  . Not on file  Social History Narrative   Entered 10/2013:   She has never driven.   She lives with her son.   Social Determinants of Health   Financial Resource Strain: Low Risk   . Difficulty of Paying Living Expenses: Not very hard  Food Insecurity: Not on file  Transportation Needs: Not on file  Physical Activity: Not on file  Stress: Not on file  Social Connections: Not on file  Intimate Partner Violence: Not on file      Review of Systems  All other systems reviewed and are negative.      Objective:   Physical Exam Vitals reviewed.  Constitutional:      General: She is not in acute distress.    Appearance: She is well-developed. She is not diaphoretic.  HENT:     Head: Normocephalic and atraumatic.  Neck:     Vascular: No JVD.  Cardiovascular:     Rate and Rhythm: Normal rate. Rhythm irregular.     Heart sounds: Normal heart sounds. No murmur heard. No friction rub. No gallop.   Pulmonary:     Effort:  Pulmonary effort is normal. No respiratory distress.     Breath sounds: No wheezing, rhonchi or rales.  Abdominal:     General: Bowel sounds are normal. There is no distension.     Palpations: Abdomen is soft. There is no mass.     Tenderness: There is no abdominal tenderness. There is no right CVA tenderness, left CVA tenderness, guarding or rebound.     Hernia: No hernia is present.  Musculoskeletal:     Cervical back: Neck supple.     Right lower leg: No edema.     Left lower leg: No edema.  Lymphadenopathy:     Cervical: No cervical adenopathy.           Assessment & Plan:  Type 2 diabetes with nephropathy (HCC) - Plan: Hemoglobin A1c, CBC with Differential/Platelet, COMPLETE METABOLIC PANEL WITH GFR, Lipid panel, Microalbumin, urine  Essential hypertension  Hyperlipidemia, unspecified hyperlipidemia type  Diastolic CHF, acute on chronic (HCC)  Chronic atrial fibrillation (HCC)  Patient's flu shot is up-to-date.  She is had 1 dose of a Covid shot and it was [employer] in the summertime.  Therefore I recommended that she get a booster of her Covid shot either [employer] or [employer].  Blood sugars are outstanding however I will reduce her Levemir to 15 units at night to reduce her risk of hypoglycemia in the morning.  Continue 7 units with meals as this seems to be controlling her postprandial sugars well.  Blood pressure is excellent today.  Heart rate is controlled.  She is appropriately anticoagulated.  She does not appear fluid overloaded.  Otherwise I will make no changes in her medication and reassess the patient in 6 months  unless lab work shows any abnormalities.

## 2020-01-07 LAB — MICROALBUMIN, URINE: Microalb, Ur: 1.5 mg/dL

## 2020-01-07 LAB — CBC WITH DIFFERENTIAL/PLATELET
Absolute Monocytes: 738 cells/uL (ref 200–950)
Basophils Absolute: 59 cells/uL (ref 0–200)
Basophils Relative: 1 %
Eosinophils Absolute: 159 cells/uL (ref 15–500)
Eosinophils Relative: 2.7 %
HCT: 35.3 % (ref 35.0–45.0)
Hemoglobin: 11.1 g/dL — ABNORMAL LOW (ref 11.7–15.5)
Lymphs Abs: 1162 cells/uL (ref 850–3900)
MCH: 27.2 pg (ref 27.0–33.0)
MCHC: 31.4 g/dL — ABNORMAL LOW (ref 32.0–36.0)
MCV: 86.5 fL (ref 80.0–100.0)
MPV: 11.1 fL (ref 7.5–12.5)
Monocytes Relative: 12.5 %
Neutro Abs: 3782 cells/uL (ref 1500–7800)
Neutrophils Relative %: 64.1 %
Platelets: 218 10*3/uL (ref 140–400)
RBC: 4.08 10*6/uL (ref 3.80–5.10)
RDW: 14.9 % (ref 11.0–15.0)
Total Lymphocyte: 19.7 %
WBC: 5.9 10*3/uL (ref 3.8–10.8)

## 2020-01-07 LAB — LIPID PANEL
Cholesterol: 123 mg/dL (ref <200)
HDL: 53 mg/dL (ref >=50)
LDL Cholesterol (Calc): 52 mg/dL (calc)
Non-HDL Cholesterol (Calc): 70 mg/dL (calc) (ref <130)
Total CHOL/HDL Ratio: 2.3 (calc) (ref <5.0)
Triglycerides: 99 mg/dL (ref <150)

## 2020-01-07 LAB — COMPLETE METABOLIC PANEL WITH GFR
AG Ratio: 1.4 (calc) (ref 1.0–2.5)
ALT: 8 U/L (ref 6–29)
AST: 9 U/L — ABNORMAL LOW (ref 10–35)
Albumin: 3.7 g/dL (ref 3.6–5.1)
Alkaline phosphatase (APISO): 103 U/L (ref 37–153)
BUN/Creatinine Ratio: 16 (calc) (ref 6–22)
BUN: 24 mg/dL (ref 7–25)
CO2: 29 mmol/L (ref 20–32)
Calcium: 8.6 mg/dL (ref 8.6–10.4)
Chloride: 104 mmol/L (ref 98–110)
Creat: 1.51 mg/dL — ABNORMAL HIGH (ref 0.60–0.93)
GFR, Est African American: 38 mL/min/{1.73_m2} — ABNORMAL LOW (ref >=60)
GFR, Est Non African American: 33 mL/min/{1.73_m2} — ABNORMAL LOW (ref >=60)
Globulin: 2.7 g/dL (calc) (ref 1.9–3.7)
Glucose, Bld: 77 mg/dL (ref 65–99)
Potassium: 4.6 mmol/L (ref 3.5–5.3)
Sodium: 142 mmol/L (ref 135–146)
Total Bilirubin: 0.4 mg/dL (ref 0.2–1.2)
Total Protein: 6.4 g/dL (ref 6.1–8.1)

## 2020-01-07 LAB — HEMOGLOBIN A1C
Hgb A1c MFr Bld: 7.3 % of total Hgb — ABNORMAL HIGH (ref <5.7)
Mean Plasma Glucose: 163 mg/dL
eAG (mmol/L): 9 mmol/L

## 2020-01-13 ENCOUNTER — Ambulatory Visit: Payer: Self-pay | Admitting: Pharmacist

## 2020-01-13 DIAGNOSIS — E1121 Type 2 diabetes mellitus with diabetic nephropathy: Secondary | ICD-10-CM

## 2020-01-13 DIAGNOSIS — J441 Chronic obstructive pulmonary disease with (acute) exacerbation: Secondary | ICD-10-CM

## 2020-01-13 DIAGNOSIS — E785 Hyperlipidemia, unspecified: Secondary | ICD-10-CM

## 2020-01-13 NOTE — Patient Instructions (Addendum)
Visit Information  Goals Addressed            This Visit's Progress   . Pharmacy Care Plan:       CARE PLAN ENTRY  Current Barriers:  . Chronic Disease Management support, education, and care coordination needs related to Hypertension, Hyperlipidemia, Diabetes, and Chronic Kidney Disease   Hypertension . Pharmacist Clinical Goal(s): o Over the next 120 days, patient will work with PharmD and providers to maintain BP goal <130/80 . Current regimen:  o Amlodipine 5mg  daily o Carvedilol 12.5mg  twice daily o Clonidine 0.3mg  daily . Interventions: o Comprehensive medication review o Reviewed home blood pressure monitoring o Continued current therapy . Patient self care activities - Over the next 120 days, patient will: o Check BP twice daily, document, and provide at future appointments o Ensure daily salt intake < 2300 mg/day o Contact PharmD or PCP with BP consistently > 130/80  Hyperlipidemia . Pharmacist Clinical Goal(s): o Over the next 120 days, patient will work with PharmD and providers to maintain LDL goal < 100 . Current regimen:  o Simvastatin 20mg  . Interventions: o Discussed current dietary changes o Reviewed most recent lab results . Patient self care activities - Over the next 120 days, patient will: o Continue to focus on medication adherence by pill box o Continue changes made to diet while ensuring to eat balanced meals throughout the day without skipping  Diabetes . Pharmacist Clinical Goal(s): o Over the next 120 days, patient will work with PharmD and providers to achieve A1c goal <7% . Current regimen:  o Levemir 100units/ml 20 units hs o Humalog 7 units twice daily . Interventions: o Reviewed home monitoring logs, assessed for risk of hypoglycemia o Discussed ways to recognize and treat hypoglycemia o Counseled on eating regular meals when taking insulin  . Patient self care activities - Over the next 120 days, patient will: o Check blood sugar  3-4 times daily, document, and provide at future appointments o Contact provider with any episodes of hypoglycemia o Remember to eat around all doses of insulin to avoid hypoglycemia  CKD . Pharmacist Clinical Goal(s) o Over the next 120 days, patient will work with PharmD and providers to optimize medication regimen based on current kidney function. . Interventions: o Reviewed all meds for safety based on kidney function. o Counseled on avoidance of NSAIDs for pain/inflammation. . Patient self care activities - Over the next 120 days, patient will: o Continue to take medications as directed. o Contact PharmD or PCP with any medication related questions or concerns.    Please see past updates related to this goal by clicking on the "Past Updates" button in the selected goal          The patient verbalized understanding of instructions, educational materials, and care plan provided today and agreed to receive a mailed copy of patient instructions, educational materials, and care plan.   Telephone follow up appointment with pharmacy team member scheduled for: 4 months  Beverly Milch, PharmD Clinical Pharmacist [facility] [phone]   COPD and Physical Activity Chronic obstructive pulmonary disease (COPD) is a long-term (chronic) condition that affects the lungs. COPD is a general term that can be used to describe many different lung problems that cause lung swelling (inflammation) and limit airflow, including chronic bronchitis and emphysema. The main symptom of COPD is shortness of breath, which makes it harder to do even simple tasks. This can also make it harder to exercise and be  active. Talk with your health care provider about treatments to help you breathe better and actions you can take to prevent breathing problems during physical activity. What are the benefits of exercising with COPD? Exercising regularly is an important part of a healthy  lifestyle. You can still exercise and do physical activities even though you have COPD. Exercise and physical activity improve your shortness of breath by increasing blood flow (circulation). This causes your heart to pump more oxygen through your body. Moderate exercise can improve your:  Oxygen use.  Energy level.  Shortness of breath.  Strength in your breathing muscles.  Heart health.  Sleep.  Self-esteem and feelings of self-worth.  Depression, stress, and anxiety levels. Exercise can benefit everyone with COPD. The severity of your disease may affect how hard you can exercise, especially at first, but everyone can benefit. Talk with your health care provider about how much exercise is safe for you, and which activities and exercises are safe for you. What actions can I take to prevent breathing problems during physical activity?  Sign up for a pulmonary rehabilitation program. This type of program may include: ? Education about lung diseases. ? Exercise classes that teach you how to exercise and be more active while improving your breathing. This usually involves:  Exercise using your lower extremities, such as a stationary bicycle.  About 30 minutes of exercise, 2 to 5 times per week, for 6 to 12 weeks  Strength training, such as push ups or leg lifts. ? Nutrition education. ? Group classes in which you can talk with others who also have COPD and learn ways to manage stress.  If you use an oxygen tank, you should use it while you exercise. Work with your health care provider to adjust your oxygen for your physical activity. Your resting flow rate is different from your flow rate during physical activity.  While you are exercising: ? Take slow breaths. ? Pace yourself and do not try to go too fast. ? Purse your lips while breathing out. Pursing your lips is similar to a kissing or whistling position. ? If doing exercise that uses a quick burst of effort, such as weight  lifting:  Breathe in before starting the exercise.  Breathe out during the hardest part of the exercise (such as raising the weights). Where to find support You can find support for exercising with COPD from:  Your health care provider.  A pulmonary rehabilitation program.  Your local health department or community health programs.  Support groups, online or in-person. Your health care provider may be able to recommend support groups. Where to find more information You can find more information about exercising with COPD from:  American Lung Association: ClassInsider.se.  COPD Foundation: https://www.rivera.net/. Contact a health care provider if:  Your symptoms get worse.  You have chest pain.  You have nausea.  You have a fever.  You have trouble talking or catching your breath.  You want to start a new exercise program or a new activity. Summary  COPD is a general term that can be used to describe many different lung problems that cause lung swelling (inflammation) and limit airflow. This includes chronic bronchitis and emphysema.  Exercise and physical activity improve your shortness of breath by increasing blood flow (circulation). This causes your heart to provide more oxygen to your body.  Contact your health care provider before starting any exercise program or new activity. Ask your health care provider what exercises and activities are safe  for you. This information is not intended to replace advice given to you by your health care provider. Make sure you discuss any questions you have with your health care provider. Document Revised: 05/01/2018 Document Reviewed: 02/01/2017 Elsevier Patient Education  2020 [employer].

## 2020-02-11 ENCOUNTER — Other Ambulatory Visit: Payer: Self-pay

## 2020-02-11 ENCOUNTER — Telehealth (INDEPENDENT_AMBULATORY_CARE_PROVIDER_SITE_OTHER): Payer: Medicare Other | Admitting: Family Medicine

## 2020-02-11 ENCOUNTER — Encounter: Payer: Self-pay | Admitting: Family Medicine

## 2020-02-11 DIAGNOSIS — Z1152 Encounter for screening for COVID-19: Secondary | ICD-10-CM | POA: Diagnosis not present

## 2020-02-11 DIAGNOSIS — J441 Chronic obstructive pulmonary disease with (acute) exacerbation: Secondary | ICD-10-CM

## 2020-02-11 MED ORDER — GUAIFENESIN ER 600 MG PO TB12: 600.0000 mg | ORAL_TABLET | Freq: Two times a day (BID) | ORAL | 1 refills | Status: DC

## 2020-02-11 MED ORDER — PREDNISONE 20 MG PO TABS: 40.0000 mg | ORAL_TABLET | Freq: Every day | ORAL | 0 refills | Status: DC

## 2020-02-11 MED ORDER — AZITHROMYCIN 250 MG PO TABS: ORAL_TABLET | ORAL | 0 refills | Status: DC

## 2020-02-11 NOTE — Progress Notes (Signed)
Virtual Visit via Telephone Note  I connected with Laura Best on 02/11/20 at 2:55  pm  by telephone and verified that I am speaking with the correct person using two identifiers.  Location: Patient: at home  Provider:  In office    I discussed the limitations, risks, security and privacy concerns of performing an evaluation and management service by telephone and the availability of in person appointments. I also discussed with the patient that there may be a patient responsible charge related to this service. The patient expressed understanding and agreed to proceed.   History of Present Illness:  Pt called in with cough with prodution, sputum is clear  For the past 2 days, she is wheezing a little   She has underlying COPD  No fever. But She has taking tylenol  No vomiting or diarrhea No aching Using cough drops, she is using trelegy and albuterol  + sick contacts  She has had COVID shot but has not had booster yet   DM levemir 15 units at bedtime     Observations/Objective: Unable to observe, normal WOB on phone, no audible wheeze   Assessment and Plan: COPD exacerbation with concern for underlying COVID-19 infection.  She has not been able to get her booster yet.  She is high risk for decompensation from COVID-19 infection.  Recommend that she get tested she states she is unable to get to our office today or anywhere else.  She will try to get here tomorrow to have testing.  In the meantime I will start her on prednisone burst along with azithromycin.  She will also start Mucinex.  Continue with her regular inhalers and albuterol.  Discussed red flags.  Follow Up Instructions:    I discussed the assessment and treatment plan with the patient. The patient was provided an opportunity to ask questions and all were answered. The patient agreed with the plan and demonstrated an understanding of the instructions.   The patient was advised to call back or seek an in-person  evaluation if the symptoms worsen or if the condition fails to improve as anticipated.  I provided 6 minutes of non-face-to-face time during this encounter. End Time: 3:01pm  Vic Blackbird, MD

## 2020-02-12 ENCOUNTER — Other Ambulatory Visit: Payer: Medicare Other

## 2020-02-17 ENCOUNTER — Telehealth: Payer: Self-pay | Admitting: Family Medicine

## 2020-02-17 DIAGNOSIS — J449 Chronic obstructive pulmonary disease, unspecified: Secondary | ICD-10-CM

## 2020-02-17 NOTE — Telephone Encounter (Signed)
Pt needing a refill of (TRELEGY ELLIPTA) 100-62.5-25 cb #: [phone]

## 2020-02-19 MED ORDER — TRELEGY ELLIPTA 100-62.5-25 MCG/INH IN AEPB: 1.0000 | INHALATION_SPRAY | Freq: Every day | RESPIRATORY_TRACT | 11 refills | Status: DC

## 2020-02-19 NOTE — Telephone Encounter (Signed)
Trelegy sent to Mail order

## 2020-02-27 ENCOUNTER — Telehealth: Payer: Self-pay | Admitting: Pharmacist

## 2020-02-27 NOTE — Progress Notes (Addendum)
Chronic Care Management Pharmacy Assistant   Name: Laura Best  MRN: [id number] DOB: [birth year]  Reason for Encounter: Disease State For DM.  Patient Questions:  1.  Have you seen any other providers since your last visit? Yes.   2.  Any changes in your medicines or health? Yes.   PCP : Susy Frizzle, MD   Their chronic conditions include:  Hypertension, CHF, Afib, COPD, GERD, Type II DM, CKD, osteoporosis, hyperlipidemia,.   Office Visits: 02/11/20 (Telemedicine) Dr. Buelah Manis. COPD Exacerbation. STARTED Azithromycin 250 mg pack , Guaifenesin 600 mg 2 times daily , and Prednisone 40 mg daily with breakfast.  Consults: None 01/13/20  Allergies:   Allergies  Allergen Reactions   Ace Inhibitors Other (See Comments)    Hyperkalemia   Actos [Pioglitazone] Swelling   Aspirin Other (See Comments)    G.I. Upset. In high doses   Metformin And Related Diarrhea   Eliquis [Apixaban] Rash and Other (See Comments)    Patient states that this med was d/ced by prescriber due to interference with allergies and states that she also had a breakout    Medications: Outpatient Encounter Medications as of 02/27/2020  Medication Sig   Accu-Chek Softclix Lancets lancets CHECK BLOOD SUGAR 3 TIMES  DAILY BEFORE MEALS AND AT  BEDTIME   acetaminophen (TYLENOL) 500 MG tablet Take 500 mg by mouth every 6 (six) hours as needed for pain.   amLODipine (NORVASC) 10 MG tablet TAKE ONE-HALF TABLET BY  MOUTH DAILY   azithromycin (ZITHROMAX) 250 MG tablet Take 2 tablets x 1 day, then 1 tablet daily x 4 days   Blood Glucose Monitoring Suppl (ACCU-CHEK AVIVA PLUS) w/Device KIT USE TO CHECK BLOOD SUGAR 4  TIMES DAILY BEFORE MEALS  AND AT BEDTIME. Dx: E11.65.   calcitRIOL (ROCALTROL) 0.25 MCG capsule Take 1 capsule (0.25 mcg total) by mouth every Monday, Wednesday, and Friday.   carvedilol (COREG) 12.5 MG tablet TAKE 1 TABLET BY MOUTH  TWICE DAILY   Cholecalciferol (VITAMIN D) 2000 UNITS CAPS Take 2,000  Units by mouth daily.    cloNIDine (CATAPRES) 0.3 MG tablet TAKE 1 TABLET BY MOUTH  TWICE DAILY   dicyclomine (BENTYL) 20 MG tablet Take 1 tablet (20 mg total) by mouth every 6 (six) hours as needed for spasms (intestinal spasms).   febuxostat (ULORIC) 40 MG tablet TAKE 1 TABLET BY MOUTH  DAILY   fluticasone (FLONASE) 50 MCG/ACT nasal spray Place 2 sprays into both nostrils daily.   Fluticasone-Umeclidin-Vilant (TRELEGY ELLIPTA) 100-62.5-25 MCG/INH AEPB Inhale 1 Inhaler into the lungs daily.   furosemide (LASIX) 40 MG tablet TAKE 1 AND 1/2 TABLETS BY  MOUTH DAILY   gabapentin (NEURONTIN) 100 MG capsule TAKE 1 TO 2 CAPSULES BY  MOUTH AT BEDTIME FOR  PERIPHERAL NEUROPATHY   glucose blood (ACCU-CHEK AVIVA PLUS) test strip CHECK BLOOD SUGAR 3 TIMES  DAILY BEFORE MEALS AND AT  BEDTIME.   Glucose Blood (BLOOD GLUCOSE TEST STRIPS) STRP Please dispense as Accu-Chek Guide Me. Use as directed to monitor FSBS 3x daily. Dx: E11.9.   guaiFENesin (MUCINEX) 600 MG 12 hr tablet Take 1 tablet (600 mg total) by mouth 2 (two) times daily.   HUMALOG KWIKPEN 100 UNIT/ML KwikPen INJECT 0.07 MLS (7 UNITS  TOTAL) INTO THE SKIN 3  (THREE) TIMES DAILY. (Patient taking differently: Inject 7 Units into the skin 2 (two) times daily.)   insulin detemir (LEVEMIR FLEXTOUCH) 100 UNIT/ML FlexPen Inject 20 Units into the skin at bedtime. SUBCUTANEOUSLY  ONCE DAILY AT  10  PM   Insulin Pen Needle (PEN NEEDLES) 31G X 6 MM MISC Use as directed to inject insulin SQ TID.   ipratropium-albuterol (DUONEB) 0.5-2.5 (3) MG/3ML SOLN Take 3 mLs by nebulization every 6 (six) hours as needed.   lubiprostone (AMITIZA) 24 MCG capsule TAKE 1 CAPSULE BY MOUTH  TWICE DAILY WITH A MEAL   omeprazole (PRILOSEC) 20 MG capsule TAKE 1 CAPSULE BY MOUTH  DAILY   ondansetron (ZOFRAN ODT) 4 MG disintegrating tablet Take 1 tablet (4 mg total) by mouth every 8 (eight) hours as needed.   polyethylene glycol (MIRALAX) packet Take 17 g by mouth daily. Hold for  diarrhea.   predniSONE (DELTASONE) 20 MG tablet Take 2 tablets (40 mg total) by mouth daily with breakfast.   psyllium (HYDROCIL/METAMUCIL) 95 % PACK Take 1 packet by mouth 3 (three) times daily.   simvastatin (ZOCOR) 20 MG tablet TAKE 1 TABLET BY MOUTH ONCE DAILY AT BEDTIME   XARELTO 15 MG TABS tablet TAKE 1 TABLET BY MOUTH  DAILY   No facility-administered encounter medications on file as of 02/27/2020.    Current Diagnosis: Patient Active Problem List   Diagnosis Date Noted   Atrial fibrillation ([place])    Acute respiratory failure with hypoxia ([place]) 10/22/2017   Uncontrolled type 2 diabetes mellitus with hyperglycemia ([place]) 10/22/2017   Atrial fibrillation, chronic 10/22/2017   Acute on chronic respiratory failure ([place]) 10/22/2017   Persistent atrial fibrillation ([place])    Acute on chronic respiratory failure with hypoxia ([place]) 01/13/2017   Diabetic peripheral neuropathy associated with type 2 diabetes mellitus ([place]) 05/11/2016   PAH (pulmonary artery hypertension) ([place])    History of atrial septal defect repair    Leukocytosis 53/29/9242   Diastolic CHF, acute on chronic (H[facility]) 04/05/2016   SBO (small bowel obstruction) (H[facility]) 02/17/2016   Chronic diastolic congestive heart failure ([place]) 02/17/2016   Diabetes mellitus with complication ([place]) 68/34/1962   Pyuria 02/17/2016   GERD (gastroesophageal reflux disease) 04/05/2015   Acute diverticulitis 05/03/2014   Bradycardia 04/02/2013   Hyperkalemia 01/22/2013   Anemia of chronic disease 01/21/2013   Acute bronchitis 01/20/2013   CKD (chronic kidney disease) stage 3, GFR 30-59 ml/min ([place]) 12/25/2012   Screening for colorectal cancer 12/25/2012   Breast cancer screening 12/25/2012   Vitamin D deficiency    Osteoporosis    Postop check 08/13/2012   Appendiceal abscess 07/30/2012   Fever, unspecified 07/19/2012   Abdominal pain 07/19/2012   SOB (shortness of breath) 07/19/2012   ARF (acute renal failure) ([place]) 07/19/2012    Abscess, appendix 07/19/2012   Type 2 diabetes with nephropathy ([place]) 02/02/2011   Pulmonary hypertension ([place]) 02/02/2011   COPD exacerbation ([place]) 02/02/2011   Hypokalemia 02/02/2011   Normocytic anemia 02/02/2011   Essential hypertension    Hyperlipidemia     Goals Addressed   None    Recent Relevant Labs: Lab Results  Component Value Date/Time   HGBA1C 7.3 (H) 01/06/2020 11:04 AM   HGBA1C 6.9 (H) 07/07/2019 10:25 AM   HGBA1C 6.9 (H) 05/10/2017 02:35 PM   HGBA1C CANCELED 04/05/2015 11:09 AM   MICROALBUR 1.5 01/06/2020 11:04 AM   MICROALBUR 0.6 07/07/2019 10:25 AM    Kidney Function Lab Results  Component Value Date/Time   CREATININE 1.51 (H) 01/06/2020 11:04 AM   CREATININE 1.80 (H) 07/07/2019 10:25 AM   GFRNONAA 33 (L) 01/06/2020 11:04 AM   GFRAA 38 (L) 01/06/2020 11:04 AM    Current antihyperglycemic regimen:  Levemir 100units/ml 20 units hs Humalog 7 units three times daily  What recent interventions/DTPs have been made to improve glycemic control:  INCREASED Humalog to 7 units three times daily.  Have there been any recent hospitalizations or ED visits since last visit with CPP? No.   Patient reports hypoglycemic symptoms, including None   Patient reports hyperglycemic symptoms, including none   How often are you checking your blood sugar? Patient stated she checks her blood sugar 3-4 times daily   What are your blood sugars ranging? Patient stated her readings are between 80-160 . I recommend that she write them down. Fasting: N/A Before meals: N/A After meals: N/A Bedtime: N/A  During the week, how often does your blood glucose drop below 70? Patient stated she has not had any readings below 70 in the past 1-2 weeks.   Are you checking your feet daily/regularly?  Patient stated she checks her feet daily and they are good.  Adherence Review: Is the patient currently on a STATIN medication? Yes, Simvastatin 20 mg.  Is the patient currently on  ACE/ARB medication? No.  Does the patient have >5 day gap between last estimated fill dates? No   Follow-Up:  Pharmacist Review   Charlann Lange, RMA Clinical Pharmacist Assistant [phone]  5 minutes spent in review, coordination, and documentation.  Reviewed by: Beverly Milch, PharmD Clinical Pharmacist [facility] [phone]

## 2020-03-15 ENCOUNTER — Other Ambulatory Visit: Payer: Self-pay | Admitting: Family Medicine

## 2020-03-19 ENCOUNTER — Telehealth: Payer: Self-pay | Admitting: Pharmacist

## 2020-03-19 NOTE — Progress Notes (Signed)
Chronic Care Management Pharmacy Assistant   Name: Laura Best  MRN: [id number] DOB: 02/07/1940  Reason for Encounter: Adherence Review  Patient Questions:  1.  Have you seen any other providers since your last visit? No.   2.  Any changes in your medicines or health? No.   PCP : Susy Frizzle, MD   Office Visits: None since 02/27/20  Consults: None since 02/27/20  Allergies:   Allergies  Allergen Reactions  . Ace Inhibitors Other (See Comments)    Hyperkalemia  . Actos [Pioglitazone] Swelling  . Aspirin Other (See Comments)    G.I. Upset. In high doses  . Metformin And Related Diarrhea  . Eliquis [Apixaban] Rash and Other (See Comments)    Patient states that this med was d/ced by prescriber due to interference with allergies and states that she also had a breakout    Medications: Outpatient Encounter Medications as of 03/19/2020  Medication Sig  . ACCU-CHEK GUIDE test strip CHECK BLOOD SUGAR 3 TIMES  DAILY BEFORE MEALS AND AT  BEDTIME  . Accu-Chek Softclix Lancets lancets CHECK BLOOD SUGAR 3 TIMES  DAILY BEFORE MEALS AND AT  BEDTIME  . acetaminophen (TYLENOL) 500 MG tablet Take 500 mg by mouth every 6 (six) hours as needed for pain.  Marland Kitchen amLODipine (NORVASC) 10 MG tablet TAKE ONE-HALF TABLET BY  MOUTH DAILY  . azithromycin (ZITHROMAX) 250 MG tablet Take 2 tablets x 1 day, then 1 tablet daily x 4 days  . Blood Glucose Monitoring Suppl (ACCU-CHEK GUIDE ME) w/Device KIT USE TO CHECK BLOOD SUGAR 4  TIMES DAILY BEFORE MEALS  AND AT BEDTIME  . calcitRIOL (ROCALTROL) 0.25 MCG capsule Take 1 capsule (0.25 mcg total) by mouth every Monday, Wednesday, and Friday.  . carvedilol (COREG) 12.5 MG tablet TAKE 1 TABLET BY MOUTH  TWICE DAILY  . Cholecalciferol (VITAMIN D) 2000 UNITS CAPS Take 2,000 Units by mouth daily.   . cloNIDine (CATAPRES) 0.3 MG tablet TAKE 1 TABLET BY MOUTH  TWICE DAILY  . dicyclomine (BENTYL) 20 MG tablet Take 1 tablet (20 mg total) by mouth every 6 (six)  hours as needed for spasms (intestinal spasms).  . febuxostat (ULORIC) 40 MG tablet TAKE 1 TABLET BY MOUTH  DAILY  . fluticasone (FLONASE) 50 MCG/ACT nasal spray Place 2 sprays into both nostrils daily.  . Fluticasone-Umeclidin-Vilant (TRELEGY ELLIPTA) 100-62.5-25 MCG/INH AEPB Inhale 1 Inhaler into the lungs daily.  . furosemide (LASIX) 40 MG tablet TAKE 1 AND 1/2 TABLETS BY  MOUTH DAILY  . gabapentin (NEURONTIN) 100 MG capsule TAKE 1 TO 2 CAPSULES BY  MOUTH AT BEDTIME FOR  PERIPHERAL NEUROPATHY  . glucose blood (ACCU-CHEK AVIVA PLUS) test strip CHECK BLOOD SUGAR 3 TIMES  DAILY BEFORE MEALS AND AT  BEDTIME.  Marland Kitchen Glucose Blood (BLOOD GLUCOSE TEST STRIPS) STRP Please dispense as Accu-Chek Guide Me. Use as directed to monitor FSBS 3x daily. Dx: E11.9.  . guaiFENesin (MUCINEX) 600 MG 12 hr tablet Take 1 tablet (600 mg total) by mouth 2 (two) times daily.  Marland Kitchen HUMALOG KWIKPEN 100 UNIT/ML KwikPen INJECT 0.07 MLS (7 UNITS  TOTAL) INTO THE SKIN 3  (THREE) TIMES DAILY. (Patient taking differently: Inject 7 Units into the skin 2 (two) times daily.)  . insulin detemir (LEVEMIR FLEXTOUCH) 100 UNIT/ML FlexPen Inject 20 Units into the skin at bedtime. SUBCUTANEOUSLY ONCE DAILY AT  10  PM  . Insulin Pen Needle (PEN NEEDLES) 31G X 6 MM MISC Use as directed to inject insulin  SQ TID.  Marland Kitchen ipratropium-albuterol (DUONEB) 0.5-2.5 (3) MG/3ML SOLN Take 3 mLs by nebulization every 6 (six) hours as needed.  . lubiprostone (AMITIZA) 24 MCG capsule TAKE 1 CAPSULE BY MOUTH  TWICE DAILY WITH A MEAL  . omeprazole (PRILOSEC) 20 MG capsule TAKE 1 CAPSULE BY MOUTH  DAILY  . ondansetron (ZOFRAN ODT) 4 MG disintegrating tablet Take 1 tablet (4 mg total) by mouth every 8 (eight) hours as needed.  . polyethylene glycol (MIRALAX) packet Take 17 g by mouth daily. Hold for diarrhea.  . predniSONE (DELTASONE) 20 MG tablet Take 2 tablets (40 mg total) by mouth daily with breakfast.  . psyllium (HYDROCIL/METAMUCIL) 95 % PACK Take 1 packet by  mouth 3 (three) times daily.  . simvastatin (ZOCOR) 20 MG tablet TAKE 1 TABLET BY MOUTH ONCE DAILY AT BEDTIME  . XARELTO 15 MG TABS tablet TAKE 1 TABLET BY MOUTH  DAILY   No facility-administered encounter medications on file as of 03/19/2020.    Current Diagnosis: Patient Active Problem List   Diagnosis Date Noted  . Atrial fibrillation ([place])   . Acute respiratory failure with hypoxia ([place]) 10/22/2017  . Uncontrolled type 2 diabetes mellitus with hyperglycemia ([place]) 10/22/2017  . Atrial fibrillation, chronic 10/22/2017  . Acute on chronic respiratory failure ([place]) 10/22/2017  . Persistent atrial fibrillation ([place])   . Acute on chronic respiratory failure with hypoxia ([place]) 01/13/2017  . Diabetic peripheral neuropathy associated with type 2 diabetes mellitus ([place]) 05/11/2016  . PAH (pulmonary artery hypertension) ([place])   . History of atrial septal defect repair   . Leukocytosis 04/06/2016  . Diastolic CHF, acute on chronic (HCC) 04/05/2016  . SBO (small bowel obstruction) ([place]) 02/17/2016  . Chronic diastolic congestive heart failure ([place]) 02/17/2016  . Diabetes mellitus with complication ([place]) 45/36/4680  . Pyuria 02/17/2016  . GERD (gastroesophageal reflux disease) 04/05/2015  . Acute diverticulitis 05/03/2014  . Bradycardia 04/02/2013  . Hyperkalemia 01/22/2013  . Anemia of chronic disease 01/21/2013  . Acute bronchitis 01/20/2013  . CKD (chronic kidney disease) stage 3, GFR 30-59 ml/min (HCC) 12/25/2012  . Screening for colorectal cancer 12/25/2012  . Breast cancer screening 12/25/2012  . Vitamin D deficiency   . Osteoporosis   . Postop check 08/13/2012  . Appendiceal abscess 07/30/2012  . Fever, unspecified 07/19/2012  . Abdominal pain 07/19/2012  . SOB (shortness of breath) 07/19/2012  . ARF (acute renal failure) ([place]) 07/19/2012  . Abscess, appendix 07/19/2012  . Type 2 diabetes with nephropathy ([place]) 02/02/2011  . Pulmonary hypertension ([place]) 02/02/2011  . COPD  exacerbation ([place]) 02/02/2011  . Hypokalemia 02/02/2011  . Normocytic anemia 02/02/2011  . Essential hypertension   . Hyperlipidemia     Goals Addressed   None    Reviewed the patients chart for any health and/or medication changes, there were not any changes's at this time.  Follow-Up:  Pharmacist Review   Charlann Lange, Onward Pharmacist Assistant [phone]

## 2020-03-23 ENCOUNTER — Telehealth: Payer: Self-pay | Admitting: *Deleted

## 2020-03-23 NOTE — Telephone Encounter (Signed)
Received fax requesting alternative to Amitiza.   Formulary alternatives include: Linzess Trulance [employer]ote[employer]

## 2020-03-23 NOTE — Telephone Encounter (Signed)
Linzess 145 mcg poqday

## 2020-03-24 MED ORDER — LINACLOTIDE 145 MCG PO CAPS: 145.0000 ug | ORAL_CAPSULE | Freq: Every day | ORAL | 3 refills | Status: DC

## 2020-03-24 NOTE — Telephone Encounter (Signed)
Call placed to patient and patient made aware.   Prescription sent to pharmacy.  

## 2020-03-29 ENCOUNTER — Telehealth: Payer: Self-pay

## 2020-03-29 NOTE — Telephone Encounter (Signed)
Patient called back she found this medication. Please disregard the prior authorization.

## 2020-03-29 NOTE — Telephone Encounter (Signed)
Patient called received a letter needs prior authorization med refill: azithromycin (ZITHROMAX) 250 MG tablet   Pharmacy: [facility]

## 2020-03-29 NOTE — Telephone Encounter (Signed)
Patient called received a letter meds needs a prior authorization.   Amitza 24 mg  Pharmacy: [facility]

## 2020-05-06 NOTE — Progress Notes (Signed)
Subjective:   Laura Best is a 80 y.o. female who presents for Medicare Annual (Subsequent) preventive examination.  I connected with Laura Best today by telephone and verified that I am speaking with the correct person using two identifiers. Location patient: home Location provider: work Persons participating in the virtual visit: patient, provider.   I discussed the limitations, risks, security and privacy concerns of performing an evaluation and management service by telephone and the availability of in person appointments. I also discussed with the patient that there may be a patient responsible charge related to this service. The patient expressed understanding and verbally consented to this telephonic visit.    Interactive audio and video telecommunications were attempted between this provider and patient, however failed, due to patient having technical difficulties OR patient did not have access to video capability.  We continued and completed visit with audio only.      Review of Systems    N/A Cardiac Risk Factors include: advanced age (>52mn, >>33women);hypertension;diabetes mellitus;dyslipidemia     Objective:    Today's Vitals   There is no height or weight on file to calculate BMI.  Advanced Directives 05/10/2020 10/05/2018 10/21/2017 05/09/2017 01/14/2017 01/13/2017 05/24/2016  Does Patient Have a Medical Advance Directive? _0  No No  Type of Advance Directive - - - - - - -  Copy of [place]? - - - - - - -  Would patient like information on creating a medical advance directive? No - Patient declined - Yes (Inpatient - patient defers creating a medical advance directive at this time) - No - Patient declined No - Patient declined No - Patient declined  Pre-existing out of facility DNR order (yellow form or pink MOST form) - - - - - - -    Current Medications (verified) Outpatient Encounter Medications as of 05/10/2020   Medication Sig  . ACCU-CHEK GUIDE test strip CHECK BLOOD SUGAR 3 TIMES  DAILY BEFORE MEALS AND AT  BEDTIME  . Accu-Chek Softclix Lancets lancets CHECK BLOOD SUGAR 3 TIMES  DAILY BEFORE MEALS AND AT  BEDTIME  . acetaminophen (TYLENOL) 500 MG tablet Take 500 mg by mouth every 6 (six) hours as needed for pain.  .Marland KitchenamLODipine (NORVASC) 10 MG tablet TAKE ONE-HALF TABLET BY  MOUTH DAILY  . Blood Glucose Monitoring Suppl (ACCU-CHEK GUIDE ME) w/Device KIT USE TO CHECK BLOOD SUGAR 4  TIMES DAILY BEFORE MEALS  AND AT BEDTIME  . calcitRIOL (ROCALTROL) 0.25 MCG capsule Take 1 capsule (0.25 mcg total) by mouth every Monday, Wednesday, and Friday.  . carvedilol (COREG) 12.5 MG tablet TAKE 1 TABLET BY MOUTH  TWICE DAILY  . Cholecalciferol (VITAMIN D) 2000 UNITS CAPS Take 2,000 Units by mouth daily.   . cloNIDine (CATAPRES) 0.3 MG tablet TAKE 1 TABLET BY MOUTH  TWICE DAILY  . dicyclomine (BENTYL) 20 MG tablet Take 1 tablet (20 mg total) by mouth every 6 (six) hours as needed for spasms (intestinal spasms).  . febuxostat (ULORIC) 40 MG tablet TAKE 1 TABLET BY MOUTH  DAILY  . fluticasone (FLONASE) 50 MCG/ACT nasal spray Place 2 sprays into both nostrils daily.  . Fluticasone-Umeclidin-Vilant (TRELEGY ELLIPTA) 100-62.5-25 MCG/INH AEPB Inhale 1 Inhaler into the lungs daily.  . furosemide (LASIX) 40 MG tablet TAKE 1 AND 1/2 TABLETS BY  MOUTH DAILY  . gabapentin (NEURONTIN) 100 MG capsule TAKE 1 TO 2 CAPSULES BY  MOUTH AT BEDTIME FOR  PERIPHERAL NEUROPATHY  .  glucose blood (ACCU-CHEK AVIVA PLUS) test strip CHECK BLOOD SUGAR 3 TIMES  DAILY BEFORE MEALS AND AT  BEDTIME.  Marland Kitchen Glucose Blood (BLOOD GLUCOSE TEST STRIPS) STRP Please dispense as Accu-Chek Guide Me. Use as directed to monitor FSBS 3x daily. Dx: E11.9.  . HUMALOG KWIKPEN 100 UNIT/ML KwikPen INJECT 0.07 MLS (7 UNITS  TOTAL) INTO THE SKIN 3  (THREE) TIMES DAILY. (Patient taking differently: Inject 7 Units into the skin 2 (two) times daily.)  . insulin detemir  (LEVEMIR FLEXTOUCH) 100 UNIT/ML FlexPen Inject 20 Units into the skin at bedtime. SUBCUTANEOUSLY ONCE DAILY AT  10  PM  . Insulin Pen Needle (PEN NEEDLES) 31G X 6 MM MISC Use as directed to inject insulin SQ TID.  Marland Kitchen ipratropium-albuterol (DUONEB) 0.5-2.5 (3) MG/3ML SOLN Take 3 mLs by nebulization every 6 (six) hours as needed.  . linaclotide (LINZESS) 145 MCG CAPS capsule Take 1 capsule (145 mcg total) by mouth daily before breakfast.  . omeprazole (PRILOSEC) 20 MG capsule TAKE 1 CAPSULE BY MOUTH  DAILY  . polyethylene glycol (MIRALAX) packet Take 17 g by mouth daily. Hold for diarrhea.  . psyllium (HYDROCIL/METAMUCIL) 95 % PACK Take 1 packet by mouth 3 (three) times daily.  . simvastatin (ZOCOR) 20 MG tablet TAKE 1 TABLET BY MOUTH ONCE DAILY AT BEDTIME  . XARELTO 15 MG TABS tablet TAKE 1 TABLET BY MOUTH  DAILY  . guaiFENesin (MUCINEX) 600 MG 12 hr tablet Take 1 tablet (600 mg total) by mouth 2 (two) times daily. (Patient not taking: Reported on 05/10/2020)  . ondansetron (ZOFRAN ODT) 4 MG disintegrating tablet Take 1 tablet (4 mg total) by mouth every 8 (eight) hours as needed. (Patient not taking: Reported on 05/10/2020)  . predniSONE (DELTASONE) 20 MG tablet Take 2 tablets (40 mg total) by mouth daily with breakfast. (Patient not taking: Reported on 05/10/2020)  . [DISCONTINUED] azithromycin (ZITHROMAX) 250 MG tablet Take 2 tablets x 1 day, then 1 tablet daily x 4 days   No facility-administered encounter medications on file as of 05/10/2020.    Allergies (verified) Ace inhibitors, Actos [pioglitazone], Aspirin, Metformin and related, and Eliquis [apixaban]   History: Past Medical History:  Diagnosis Date  . Arthritis    "hands, feet" (02/17/2016)  . Atrial fibrillation ([place])   . CAD (coronary artery disease)    a. 05/2016: cath showing nonobstructive CAD with 50% prox-Cx stenosis  . CKD (chronic kidney disease) stage 3, GFR 30-59 ml/min ([facility]) 12/25/2012  . COPD (chronic obstructive  pulmonary disease) ([place])   . Diastolic heart failure ([place])   . Hyperlipidemia   . Hypertension   . Osteoporosis   . Pneumonia    "several times" (02/17/2016)  . Small bowel obstruction ([place]) 02/17/2016  . Tricuspid regurgitation    Echo 05/02/06-Nml LV. Mild MR. Mod TR  . Type II diabetes mellitus ([place])   . Vitamin D deficiency    Past Surgical History:  Procedure Laterality Date  . APPENDECTOMY    . HEART CHAMBER REVISION     patched hole --[place]  . LAPAROSCOPIC APPENDECTOMY N/A 09/27/2013   Procedure: APPENDECTOMY - CONVERTED FROM LAPAROSCOPIC;  Surgeon: Donnie Mesa, MD;  Location: [facility];  Service: General;  Laterality: N/A;  . RIGHT/LEFT HEART CATH AND CORONARY ANGIOGRAPHY N/A 05/24/2016   Procedure: Right/Left Heart Cath and Coronary Angiography;  Surgeon: Larey Dresser, MD;  Location: [facility];  Service: Cardiovascular;  Laterality: N/A;  . TUBAL LIGATION     Family History  Problem Relation Age of Onset  . Cancer Father   . Diabetes Father   . Cancer Brother        Brain  . Cancer Sister        abdominal fat   Social History   Socioeconomic History  . Marital status: Widowed    Spouse name: Not on file  . Number of children: Not on file  . Years of education: Not on file  . Highest education level: Not on file  Occupational History  . Occupation: retired  Tobacco Use  . Smoking status: Former Smoker    Packs/day: 0.50    Years: 50.00    Pack years: 25.00    Types: Cigarettes    Quit date: 01/19/2009    Years since quitting: 11.3  . Smokeless tobacco: Never Used  Vaping Use  . Vaping Use: Never used  Substance and Sexual Activity  . Alcohol use: No    Alcohol/week: 0.0 standard drinks  . Drug use: No  . Sexual activity: Never  Other Topics Concern  . Not on file  Social History Narrative   Entered 10/2013:   She has never driven.   She lives with her son.   Social Determinants of Health   Financial Resource Strain: Low Risk   .  Difficulty of Paying Living Expenses: Not hard at all  Food Insecurity: No Food Insecurity  . Worried About Charity fundraiser in the Last Year: Never true  . Ran Out of Food in the Last Year: Never true  Transportation Needs: No Transportation Needs  . Lack of Transportation (Medical): No  . Lack of Transportation (Non-Medical): No  Physical Activity: Inactive  . Days of Exercise per Week: 0 days  . Minutes of Exercise per Session: 0 min  Stress: No Stress Concern Present  . Feeling of Stress : Not at all  Social Connections: Socially Isolated  . Frequency of Communication with Friends and Family: More than three times a week  . Frequency of Social Gatherings with Friends and Family: Once a week  . Attends Religious Services: Never  . Active Member of Clubs or Organizations: No  . Attends Archivist Meetings: Never  . Marital Status: Widowed    Tobacco Counseling Counseling given: Not Answered   Clinical Intake:  Pre-visit preparation completed: Yes  Pain : No/denies pain     Nutritional Risks: Nausea/ vomitting/ diarrhea (diarrhea) Diabetes: Yes CBG done?: No Did pt. bring in CBG monitor from home?: No  How often do you need to have someone help you when you read instructions, pamphlets, or other written materials from your doctor or pharmacy?: 1 - Never  Diabetic?Yes Nutrition Risk Assessment:  Has the patient had any N/V/D within the last 2 months?  No  Does the patient have any non-healing wounds?  No  Has the patient had any unintentional weight loss or weight gain?  No   Diabetes:  Is the patient diabetic?  Yes  If diabetic, was a CBG obtained today?  No  Did the patient bring in their glucometer from home?  No  How often do you monitor your CBG's? Checks glucose 4x per day.   Financial Strains and Diabetes Management:  Are you having any financial strains with the device, your supplies or your medication? No .  Does the patient want to be  seen by Chronic Care Management for management of their diabetes?  No  Would the patient like to be referred to a  Nutritionist or for Diabetic Management?  No   Diabetic Exams:  Diabetic Eye Exam: Overdue for diabetic eye exam. Pt has been advised about the importance in completing this exam. Patient advised to call and schedule an eye exam. Diabetic Foot Exam: Completed 07/07/2019   Interpreter Needed?: No  Information entered by :: [place] of Daily Living In your present state of health, do you have any difficulty performing the following activities: 05/10/2020  Hearing? N  Vision? N  Difficulty concentrating or making decisions? N  Walking or climbing stairs? N  Dressing or bathing? N  Doing errands, shopping? Y  Comment currently does not Physiological scientist and eating ? N  Using the Toilet? N  In the past six months, have you accidently leaked urine? Y  Comment has bladder incontinence  Do you have problems with loss of bowel control? N  Managing your Medications? N  Managing your Finances? N  Housekeeping or managing your Housekeeping? N  Some recent data might be hidden    Patient Care Team: Susy Frizzle, MD as PCP - General (Family Medicine) Harl Bowie, Alphonse Guild, MD as PCP - Cardiology (Cardiology) Susy Frizzle, MD (Family Medicine) Edythe Clarity, [facility] as Pharmacist (Pharmacist)  Indicate any recent Medical Services you may have received from other than [facility] providers in the past year (date may be approximate).     Assessment:   This is a routine wellness examination for Laura Best.  Hearing/Vision screen  Hearing Screening   _0  _1  _2  _3  _4  _5  _6  _7  _8   Right ear:           Left ear:           Vision Screening Comments: Patient states has not had eye examined in several years. Wears reading glasses    Dietary issues and exercise activities discussed: Current Exercise Habits: The patient does not  participate in regular exercise at present, Exercise limited by: None identified  Goals    . Exercise 3x per week (30 min per time)    . HEMOGLOBIN A1C < 7    . Pharmacy Care Plan:     CARE PLAN ENTRY  Current Barriers:  . Chronic Disease Management support, education, and care coordination needs related to Hypertension, Hyperlipidemia, Diabetes, and Chronic Kidney Disease   Hypertension . Pharmacist Clinical Goal(s): o Over the next 120 days, patient will work with PharmD and providers to maintain BP goal <130/80 . Current regimen:  o Amlodipine 6m daily o Carvedilol 12.531mtwice daily o Clonidine 0.35m19maily . Interventions: o Comprehensive medication review o Reviewed home blood pressure monitoring o Continued current therapy . Patient self care activities - Over the next 120 days, patient will: o Check BP twice daily, document, and provide at future appointments o Ensure daily salt intake < 2300 mg/day o Contact PharmD or PCP with BP consistently > 130/80  Hyperlipidemia . Pharmacist Clinical Goal(s): o Over the next 120 days, patient will work with PharmD and providers to maintain LDL goal < 100 . Current regimen:  o Simvastatin 8m39mInterventions: o Discussed current dietary changes o Reviewed most recent lab results . Patient self care activities - Over the next 120 days, patient will: o Continue to focus on medication adherence by pill box o Continue changes made to diet while ensuring to eat balanced meals throughout the day without skipping  Diabetes . Pharmacist Clinical Goal(s): o Over the next 120 days, patient will work with PharmD  and providers to achieve A1c goal <7% . Current regimen:  o Levemir 100units/ml 20 units hs o Humalog 7 units twice daily . Interventions: o Reviewed home monitoring logs, assessed for risk of hypoglycemia o Discussed ways to recognize and treat hypoglycemia o Counseled on eating regular meals when taking insulin  . Patient  self care activities - Over the next 120 days, patient will: o Check blood sugar 3-4 times daily, document, and provide at future appointments o Contact provider with any episodes of hypoglycemia o Remember to eat around all doses of insulin to avoid hypoglycemia  CKD . Pharmacist Clinical Goal(s) o Over the next 120 days, patient will work with PharmD and providers to optimize medication regimen based on current kidney function. . Interventions: o Reviewed all meds for safety based on kidney function. o Counseled on avoidance of NSAIDs for pain/inflammation. . Patient self care activities - Over the next 120 days, patient will: o Continue to take medications as directed. o Contact PharmD or PCP with any medication related questions or concerns.    Please see past updates related to this goal by clicking on the "Past Updates" button in the selected goal         Depression Screen PHQ 2/9 Scores 05/10/2020 03/17/2019 03/17/2019 12/11/2017 01/24/2017 12/27/2016 09/20/2016  PHQ - 2 Score 0 0 0 0 0 0 0  PHQ- 9 Score 0 - - - - - 0    Fall Risk Fall Risk  05/10/2020 03/17/2019 12/11/2017 01/24/2017 12/27/2016  Falls in the past year? 0 0 0 No No  Number falls in past yr: 0 - - - -  Injury with Fall? 0 - - - -  Risk for fall due to : Medication side effect - - - -  Follow up Falls evaluation completed;Falls prevention discussed Falls evaluation completed Falls evaluation completed - -    FALL RISK PREVENTION PERTAINING TO THE HOME:  Any stairs in or around the home? No  If so, are there any without handrails? No  Home free of loose throw rugs in walkways, pet beds, electrical cords, etc? Yes  Adequate lighting in your home to reduce risk of falls? Yes   ASSISTIVE DEVICES UTILIZED TO PREVENT FALLS:  Life alert? No  Use of a cane, walker or w/c? No  Grab bars in the bathroom? Yes  Shower chair or bench in shower? Yes  Elevated toilet seat or a handicapped toilet? Yes   Cognitive  Function:        Immunizations Immunization History  Administered Date(s) Administered  . Fluad Quad(high Dose 65+) 10/11/2018, 12/03/2019  . Influenza Whole 11/30/2009  . Influenza, High Dose Seasonal PF 10/23/2017  . Influenza,inj,Quad PF,6+ Mos 12/16/2012, 10/03/2013, 10/05/2014, 10/18/2015, 09/20/2016  . Janssen (J&J) SARS-COV-2 Vaccination 07/09/2019  . Pneumococcal Conjugate-13 12/25/2012  . Pneumococcal Polysaccharide-23 11/30/2009  . Tdap 12/25/2012    TDAP status: Up to date  Flu Vaccine status: Up to date  Pneumococcal vaccine status: Up to date  Covid-19 vaccine status: Completed vaccines  Qualifies for Shingles Vaccine? Yes   Zostavax completed No   Shingrix Completed?: No.    Education has been provided regarding the importance of this vaccine. Patient has been advised to call insurance company to determine out of pocket expense if they have not yet received this vaccine. Advised may also receive vaccine at local pharmacy or Health Dept. Verbalized acceptance and understanding.  Screening Tests Health Maintenance  Topic Date Due  . Hepatitis C Screening  Never  done  . OPHTHALMOLOGY EXAM  Never done  . MAMMOGRAM  02/25/2014  . COVID-19 Vaccine (2 - Booster for Janssen series) 09/03/2019  . FOOT EXAM  07/06/2020  . HEMOGLOBIN A1C  07/06/2020  . INFLUENZA VACCINE  08/23/2020  . TETANUS/TDAP  12/26/2022  . DEXA SCAN  Completed  . PNA vac Low Risk Adult  Completed  . HPV VACCINES  Aged Out    Health Maintenance  Health Maintenance Due  Topic Date Due  . Hepatitis C Screening  Never done  . OPHTHALMOLOGY EXAM  Never done  . MAMMOGRAM  02/25/2014  . COVID-19 Vaccine (2 - Booster for Janssen series) 09/03/2019    Colorectal cancer screening: No longer required.   Mammogram status: No longer required due to preference .  Bone Density status: Ordered 05/10/2020. Pt provided with contact info and advised to call to schedule appt.  Lung Cancer Screening:  (Low Dose CT Chest recommended if Age 71-80 years, 30 pack-year currently smoking OR have quit w/in 15years.) does not qualify.   Lung Cancer Screening Referral: N/A  Additional Screening:  Hepatitis C Screening: does qualify;   Vision Screening: Recommended annual ophthalmology exams for early detection of glaucoma and other disorders of the eye. Is the patient up to date with their annual eye exam?  No  Who is the provider or what is the name of the office in which the patient attends annual eye exams? Does not currently have an eye doctor  If pt is not established with a provider, would they like to be referred to a provider to establish care? No .   Dental Screening: Recommended annual dental exams for proper oral hygiene  Community Resource Referral / Chronic Care Management: CRR required this visit?  No   CCM required this visit?  No      Plan:     I have personally reviewed and noted the following in the patient's chart:   . Medical and social history . Use of alcohol, tobacco or illicit drugs  . Current medications and supplements . Functional ability and status . Nutritional status . Physical activity . Advanced directives . List of other physicians . Hospitalizations, surgeries, and ER visits in previous 12 months . Vitals . Screenings to include cognitive, depression, and falls . Referrals and appointments  In addition, I have reviewed and discussed with patient certain preventive protocols, quality metrics, and best practice recommendations. A written personalized care plan for preventive services as well as general preventive health recommendations were provided to patient.     Ofilia Neas, LPN   [id number]   Nurse Notes: None

## 2020-05-10 ENCOUNTER — Ambulatory Visit (INDEPENDENT_AMBULATORY_CARE_PROVIDER_SITE_OTHER): Payer: Medicare Other

## 2020-05-10 DIAGNOSIS — Z0000 Encounter for general adult medical examination without abnormal findings: Secondary | ICD-10-CM | POA: Diagnosis not present

## 2020-05-10 NOTE — Patient Instructions (Signed)
[name] , Thank you for taking time to come for your Medicare Wellness Visit. I appreciate your ongoing commitment to your health goals. Please review the following plan we discussed and let me know if I can assist you in the future.   Screening recommendations/referrals: Colonoscopy: No longer required  Mammogram: No longer required  Bone Density: Currently due orders placed this visit. Recommended yearly ophthalmology/optometry visit for glaucoma screening and checkup Recommended yearly dental visit for hygiene and checkup  Vaccinations: Influenza vaccine: Up to date, next due fall 80  Pneumococcal vaccine: Completed series  Tdap vaccine: Up to date, next due 12/26/2022 Shingles vaccine: Currently due for shingrix, if you would like to receive we recommend that you do so at your local pharmacy.    Advanced directives: Advance directive discussed with you today. Even though you declined this today please call our office should you change your mind and we can give you the proper paperwork for you to fill out.   Conditions/risks identified: None   Next appointment: 05/13/2021 @ 11:15 am with [place] 65 Years and Older, Female Preventive care refers to lifestyle choices and visits with your health care provider that can promote health and wellness. What does preventive care include?  A yearly physical exam. This is also called an annual well check.  Dental exams once or twice a year.  Routine eye exams. Ask your health care provider how often you should have your eyes checked.  Personal lifestyle choices, including:  Daily care of your teeth and gums.  Regular physical activity.  Eating a healthy diet.  Avoiding tobacco and drug use.  Limiting alcohol use.  Practicing safe sex.  Taking low-dose aspirin every day.  Taking vitamin and mineral supplements as recommended by your health care provider. What happens during an annual  well check? The services and screenings done by your health care provider during your annual well check will depend on your age, overall health, lifestyle risk factors, and family history of disease. Counseling  Your health care provider may ask you questions about your:  Alcohol use.  Tobacco use.  Drug use.  Emotional well-being.  Home and relationship well-being.  Sexual activity.  Eating habits.  History of falls.  Memory and ability to understand (cognition).  Work and work Statistician.  Reproductive health. Screening  You may have the following tests or measurements:  Height, weight, and BMI.  Blood pressure.  Lipid and cholesterol levels. These may be checked every 5 years, or more frequently if you are over 58 years old.  Skin check.  Lung cancer screening. You may have this screening every year starting at age 97 if you have a 30-pack-year history of smoking and currently smoke or have quit within the past 15 years.  Fecal occult blood test (FOBT) of the stool. You may have this test every year starting at age 24.  Flexible sigmoidoscopy or colonoscopy. You may have a sigmoidoscopy every 5 years or a colonoscopy every 10 years starting at age 51.  Hepatitis C blood test.  Hepatitis B blood test.  Sexually transmitted disease (STD) testing.  Diabetes screening. This is done by checking your blood sugar (glucose) after you have not eaten for a while (fasting). You may have this done every 1-3 years.  Bone density scan. This is done to screen for osteoporosis. You may have this done starting at age 36.  Mammogram. This may be done every 1-2 years. Talk  to your health care provider about how often you should have regular mammograms. Talk with your health care provider about your test results, treatment options, and if necessary, the need for more tests. Vaccines  Your health care provider may recommend certain vaccines, such as:  Influenza vaccine. This  is recommended every year.  Tetanus, diphtheria, and acellular pertussis (Tdap, Td) vaccine. You may need a Td booster every 10 years.  Zoster vaccine. You may need this after age 84.  Pneumococcal 13-valent conjugate (PCV13) vaccine. One dose is recommended after age 11.  Pneumococcal polysaccharide (PPSV23) vaccine. One dose is recommended after age 39. Talk to your health care provider about which screenings and vaccines you need and how often you need them. This information is not intended to replace advice given to you by your health care provider. Make sure you discuss any questions you have with your health care provider. Document Released: 02/05/2015 Document Revised: 09/29/2015 Document Reviewed: 11/10/2014 Elsevier Interactive Patient Education  2017 [employer] Prevention in the Home Falls can cause injuries. They can happen to people of all ages. There are many things you can do to make your home safe and to help prevent falls. What can I do on the outside of my home?  Regularly fix the edges of walkways and driveways and fix any cracks.  Remove anything that might make you trip as you walk through a door, such as a raised step or threshold.  Trim any bushes or trees on the path to your home.  Use bright outdoor lighting.  Clear any walking paths of anything that might make someone trip, such as rocks or tools.  Regularly check to see if handrails are loose or broken. Make sure that both sides of any steps have handrails.  Any raised decks and porches should have guardrails on the edges.  Have any leaves, snow, or ice cleared regularly.  Use sand or salt on walking paths during winter.  Clean up any spills in your garage right away. This includes oil or grease spills. What can I do in the bathroom?  Use night lights.  Install grab bars by the toilet and in the tub and shower. Do not use towel bars as grab bars.  Use non-skid mats or decals in the tub or  shower.  If you need to sit down in the shower, use a plastic, non-slip stool.  Keep the floor dry. Clean up any water that spills on the floor as soon as it happens.  Remove soap buildup in the tub or shower regularly.  Attach bath mats securely with double-sided non-slip rug tape.  Do not have throw rugs and other things on the floor that can make you trip. What can I do in the bedroom?  Use night lights.  Make sure that you have a light by your bed that is easy to reach.  Do not use any sheets or blankets that are too big for your bed. They should not hang down onto the floor.  Have a firm chair that has side arms. You can use this for support while you get dressed.  Do not have throw rugs and other things on the floor that can make you trip. What can I do in the kitchen?  Clean up any spills right away.  Avoid walking on wet floors.  Keep items that you use a lot in easy-to-reach places.  If you need to reach something above you, use a strong step stool that  has a grab bar.  Keep electrical cords out of the way.  Do not use floor polish or wax that makes floors slippery. If you must use wax, use non-skid floor wax.  Do not have throw rugs and other things on the floor that can make you trip. What can I do with my stairs?  Do not leave any items on the stairs.  Make sure that there are handrails on both sides of the stairs and use them. Fix handrails that are broken or loose. Make sure that handrails are as long as the stairways.  Check any carpeting to make sure that it is firmly attached to the stairs. Fix any carpet that is loose or worn.  Avoid having throw rugs at the top or bottom of the stairs. If you do have throw rugs, attach them to the floor with carpet tape.  Make sure that you have a light switch at the top of the stairs and the bottom of the stairs. If you do not have them, ask someone to add them for you. What else can I do to help prevent  falls?  Wear shoes that:  Do not have high heels.  Have rubber bottoms.  Are comfortable and fit you well.  Are closed at the toe. Do not wear sandals.  If you use a stepladder:  Make sure that it is fully opened. Do not climb a closed stepladder.  Make sure that both sides of the stepladder are locked into place.  Ask someone to hold it for you, if possible.  Clearly mark and make sure that you can see:  Any grab bars or handrails.  First and last steps.  Where the edge of each step is.  Use tools that help you move around (mobility aids) if they are needed. These include:  Canes.  Walkers.  Scooters.  Crutches.  Turn on the lights when you go into a dark area. Replace any light bulbs as soon as they burn out.  Set up your furniture so you have a clear path. Avoid moving your furniture around.  If any of your floors are uneven, fix them.  If there are any pets around you, be aware of where they are.  Review your medicines with your doctor. Some medicines can make you feel dizzy. This can increase your chance of falling. Ask your doctor what other things that you can do to help prevent falls. This information is not intended to replace advice given to you by your health care provider. Make sure you discuss any questions you have with your health care provider. Document Released: 11/05/2008 Document Revised: 06/17/2015 Document Reviewed: 02/13/2014 Elsevier Interactive Patient Education  2017 [employer].

## 2020-05-12 ENCOUNTER — Telehealth: Payer: Self-pay | Admitting: Family Medicine

## 2020-05-12 NOTE — Telephone Encounter (Signed)
Patient called to request refill of  AMITIZA 24 MCG capsule [id number] DISCONTINUED  Patient going out of town on Sunday  Pharmacy:   [facility], [address], [address], [place], [place] [address]  Phone:  [phone] Fax:  [phone]    Please advise patient when Rx called in at [phone]

## 2020-05-13 NOTE — Progress Notes (Signed)
Chronic Care Management Pharmacy Note  05/21/2020 Name:  Laura Best MRN:  [id number] DOB:  [birth year]  Subjective: Laura Best is an 80 y.o. year old female who is a primary patient of Pickard, Cammie Mcgee, MD.  The CCM team was consulted for assistance with disease management and care coordination needs.    Engaged with patient by telephone for follow up visit in response to provider referral for pharmacy case management and/or care coordination services.   Consent to Services:  The patient was given the following information about Chronic Care Management services today, agreed to services, and gave verbal consent: 1. CCM service includes personalized support from designated clinical staff supervised by the primary care provider, including individualized plan of care and coordination with other care providers 2. 24/7 contact phone numbers for assistance for urgent and routine care needs. 3. Service will only be billed when office clinical staff spend 20 minutes or more in a month to coordinate care. 4. Only one practitioner may furnish and bill the service in a calendar month. 5.The patient may stop CCM services at any time (effective at the end of the month) by phone call to the office staff. 6. The patient will be responsible for cost sharing (co-pay) of up to 20% of the service fee (after annual deductible is met). Patient agreed to services and consent obtained.  Patient Care Team: Susy Frizzle, MD as PCP - General (Family Medicine) Harl Bowie, Alphonse Guild, MD as PCP - Cardiology (Cardiology) Susy Frizzle, MD (Family Medicine) Edythe Clarity, [facility] as Pharmacist (Pharmacist)  Recent office visits: None since last CCM call on 03/19/20  Recent consult visits: None since last CCM call on 03/19/20  Hospital visits: None in previous 6 months  Objective:  Lab Results  Component Value Date   CREATININE 1.51 (H) 01/06/2020   BUN 24 01/06/2020   GFRNONAA 33 (L) 01/06/2020    GFRAA 38 (L) 01/06/2020   NA 142 01/06/2020   K 4.6 01/06/2020   CALCIUM 8.6 01/06/2020   CO2 29 01/06/2020   GLUCOSE 77 01/06/2020    Lab Results  Component Value Date/Time   HGBA1C 7.3 (H) 01/06/2020 11:04 AM   HGBA1C 6.9 (H) 07/07/2019 10:25 AM   HGBA1C 6.9 (H) 05/10/2017 02:35 PM   HGBA1C CANCELED 04/05/2015 11:09 AM   MICROALBUR 1.5 01/06/2020 11:04 AM   MICROALBUR 0.6 07/07/2019 10:25 AM    Last diabetic Eye exam: No results found for: HMDIABEYEEXA  Last diabetic Foot exam: No results found for: HMDIABFOOTEX   Lab Results  Component Value Date   CHOL 123 01/06/2020   HDL 53 01/06/2020   LDLCALC 52 01/06/2020   TRIG 99 01/06/2020   CHOLHDL 2.3 01/06/2020    Hepatic Function Latest Ref Rng & Units 01/06/2020 07/07/2019 03/17/2019  Total Protein 6.1 - 8.1 g/dL 6.4 6.4 6.4  Albumin 3.5 - 5.0 g/dL - - -  AST 10 - 35 U/L 9(L) 8(L) 8(L)  ALT 6 - 29 U/L $Remo'8 6 7  'qsJfG$ Alk Phosphatase 38 - 126 U/L - - -  Total Bilirubin 0.2 - 1.2 mg/dL 0.4 0.4 0.5  Bilirubin, Direct 0.0 - 0.3 mg/dL - - -    Lab Results  Component Value Date/Time   TSH 1.863 01/14/2017 08:41 AM   TSH 2.46 09/20/2016 11:23 AM   TSH 3.349 05/16/2016 12:30 PM   TSH 1.316 01/19/2013 08:58 AM    CBC Latest Ref Rng & Units 01/06/2020 07/07/2019 03/17/2019  WBC 3.8 -  10.8 Thousand/uL 5.9 6.2 5.0  Hemoglobin 11.7 - 15.5 g/dL 11.1(L) 11.6(L) 11.8  Hematocrit 35.0 - 45.0 % 35.3 35.9 36.7  Platelets 140 - 400 Thousand/uL 218 211 215    Lab Results  Component Value Date/Time   VD25OH 18 (L) 12/16/2012 11:22 AM    Clinical ASCVD: No  The ASCVD Risk score Mikey Bussing DC Jr., et al., 2013) failed to calculate for the following reasons:   The systolic blood pressure is missing   The valid total cholesterol range is 130 to 320 mg/dL    Depression screen [facility] 2/9 05/10/2020 03/17/2019 03/17/2019  Decreased Interest 0 0 0  Down, Depressed, Hopeless 0 0 0  PHQ - 2 Score 0 0 0  Altered sleeping 0 - -  Tired, decreased energy 0  - -  Change in appetite 0 - -  Feeling bad or failure about yourself  0 - -  Trouble concentrating 0 - -  Moving slowly or fidgety/restless 0 - -  Suicidal thoughts 0 - -  PHQ-9 Score 0 - -  Difficult doing work/chores Not difficult at all - -  Some recent data might be hidden      Social History   Tobacco Use  Smoking Status Former Smoker  . Packs/day: 0.50  . Years: 50.00  . Pack years: 25.00  . Types: Cigarettes  . Quit date: 01/19/2009  . Years since quitting: 11.3  Smokeless Tobacco Never Used   BP Readings from Last 3 Encounters:  01/06/20 120/70  10/02/19 126/64  07/07/19 110/60   Pulse Readings from Last 3 Encounters:  01/06/20 94  10/02/19 74  07/07/19 66   Wt Readings from Last 3 Encounters:  01/06/20 175 lb (79.4 kg)  10/02/19 178 lb 9.6 oz (81 kg)  07/07/19 176 lb (79.8 kg)   BMI Readings from Last 3 Encounters:  01/06/20 31.00 kg/m  10/02/19 31.64 kg/m  07/07/19 31.18 kg/m    Assessment/Interventions: Review of patient past medical history, allergies, medications, health status, including review of consultants reports, laboratory and other test data, was performed as part of comprehensive evaluation and provision of chronic care management services.   SDOH:  (Social Determinants of Health) assessments and interventions performed: Yes  SDOH Screenings   Alcohol Screen: Low Risk   . Last Alcohol Screening Score (AUDIT): 0  Depression (PHQ2-9): Low Risk   . PHQ-2 Score: 0  Financial Resource Strain: Low Risk   . Difficulty of Paying Living Expenses: Not hard at all  Food Insecurity: No Food Insecurity  . Worried About Charity fundraiser in the Last Year: Never true  . Ran Out of Food in the Last Year: Never true  Housing: Low Risk   . Last Housing Risk Score: 0  Physical Activity: Inactive  . Days of Exercise per Week: 0 days  . Minutes of Exercise per Session: 0 min  Social Connections: Socially Isolated  . Frequency of Communication  with Friends and Family: More than three times a week  . Frequency of Social Gatherings with Friends and Family: Once a week  . Attends Religious Services: Never  . Active Member of Clubs or Organizations: No  . Attends Archivist Meetings: Never  . Marital Status: Widowed  Stress: No Stress Concern Present  . Feeling of Stress : Not at all  Tobacco Use: Medium Risk  . Smoking Tobacco Use: Former Smoker  . Smokeless Tobacco Use: Never Used  Transportation Needs: No Transportation Needs  . Lack of  Transportation (Medical): No  . Lack of Transportation (Non-Medical): No    CCM Care Plan  Allergies  Allergen Reactions  . Ace Inhibitors Other (See Comments)    Hyperkalemia  . Actos [Pioglitazone] Swelling  . Aspirin Other (See Comments)    G.I. Upset. In high doses  . Metformin And Related Diarrhea  . Eliquis [Apixaban] Rash and Other (See Comments)    Patient states that this med was d/ced by prescriber due to interference with allergies and states that she also had a breakout    Medications Reviewed Today    Reviewed by Edythe Clarity, [facility] (Pharmacist) on 05/21/20 at [phone]  Med List Status: <None>  Medication Order Taking? Sig Documenting Provider Last Dose Status Informant  ACCU-CHEK GUIDE test strip [id number] Yes CHECK BLOOD SUGAR 3 TIMES  DAILY BEFORE MEALS AND AT  BEDTIME Susy Frizzle, MD Taking Active   Accu-Chek Softclix Lancets lancets [id number] Yes CHECK BLOOD SUGAR 3 TIMES  DAILY BEFORE MEALS AND AT  BEDTIME Susy Frizzle, MD Taking Active   acetaminophen (TYLENOL) 500 MG tablet [id number] Yes Take 500 mg by mouth every 6 (six) hours as needed for pain. [provider] Taking Active Self  amLODipine (NORVASC) 10 MG tablet [id number] Yes TAKE ONE-HALF TABLET BY  MOUTH DAILY Susy Frizzle, MD Taking Active   Blood Glucose Monitoring Suppl (ACCU-CHEK GUIDE ME) w/Device KIT [id number] Yes USE TO CHECK BLOOD SUGAR 4  TIMES DAILY BEFORE MEALS  AND  AT BEDTIME Susy Frizzle, MD Taking Active   calcitRIOL (ROCALTROL) 0.25 MCG capsule [id number] Yes Take 1 capsule (0.25 mcg total) by mouth every Monday, Wednesday, and Friday. Susy Frizzle, MD Taking Active   carvedilol (COREG) 12.5 MG tablet [id number] Yes TAKE 1 TABLET BY MOUTH  TWICE DAILY Susy Frizzle, MD Taking Active   Cholecalciferol (VITAMIN D) 2000 UNITS CAPS [id number] Yes Take 2,000 Units by mouth daily.  [provider] Taking Active Self  cloNIDine (CATAPRES) 0.3 MG tablet [id number] Yes TAKE 1 TABLET BY MOUTH  TWICE DAILY Pickard, Cammie Mcgee, MD Taking Active   dicyclomine (BENTYL) 20 MG tablet [id number] Yes Take 1 tablet (20 mg total) by mouth every 6 (six) hours as needed for spasms (intestinal spasms). Susy Frizzle, MD Taking Active   febuxostat (ULORIC) 40 MG tablet [id number] Yes TAKE 1 TABLET BY MOUTH  DAILY Susy Frizzle, MD Taking Active   fluticasone [facility]) 50 MCG/ACT nasal spray [id number] Yes Place 2 sprays into both nostrils daily. Susy Frizzle, MD Taking Active   Fluticasone-Umeclidin-Vilant (TRELEGY ELLIPTA) 100-62.5-25 MCG/INH AEPB [id number] Yes Inhale 1 Inhaler into the lungs daily. Alycia Rossetti, MD Taking Active   furosemide (LASIX) 40 MG tablet [id number] Yes TAKE 1 AND 1/2 TABLETS BY  MOUTH DAILY Susy Frizzle, MD Taking Active   gabapentin (NEURONTIN) 100 MG capsule [id number] Yes TAKE 1 TO 2 CAPSULES BY  MOUTH AT BEDTIME FOR  PERIPHERAL NEUROPATHY Susy Frizzle, MD Taking Active   glucose blood (ACCU-CHEK AVIVA PLUS) test strip [id number] Yes CHECK BLOOD SUGAR 3 TIMES  DAILY BEFORE MEALS AND AT  BEDTIME. Susy Frizzle, MD Taking Active   Glucose Blood (BLOOD GLUCOSE TEST STRIPS) STRP [id number] Yes Please dispense as Accu-Chek Guide Me. Use as directed to monitor FSBS 3x daily. Dx: E11.9. Packwood, Modena Nunnery, MD Taking Active   guaiFENesin (MUCINEX) 600 MG 12 hr tablet [id number] Yes Take 1 tablet (600 mg total) by mouth 2  (two) times  daily. Alycia Rossetti, MD Taking Active   HUMALOG KWIKPEN 100 UNIT/ML KwikPen [id number] Yes INJECT 0.07 MLS (7 UNITS  TOTAL) INTO THE SKIN 3  (THREE) TIMES DAILY.  Patient taking differently: Inject 7 Units into the skin 2 (two) times daily.   Susy Frizzle, MD Taking Active   insulin detemir (LEVEMIR FLEXTOUCH) 100 UNIT/ML FlexPen [id number] Yes Inject 20 Units into the skin at bedtime. SUBCUTANEOUSLY ONCE DAILY AT  10  PM Susy Frizzle, MD Taking Active   Insulin Pen Needle (PEN NEEDLES) 31G X 6 MM MISC [id number] Yes Use as directed to inject insulin SQ TID. Susy Frizzle, MD Taking Active   ipratropium-albuterol (DUONEB) 0.5-2.5 (3) MG/3ML SOLN [id number] Yes Take 3 mLs by nebulization every 6 (six) hours as needed. Orlena Sheldon, PA-C Taking Active   linaclotide [facility]) 145 MCG CAPS capsule [id number] Yes Take 1 capsule (145 mcg total) by mouth daily before breakfast. Susy Frizzle, MD Taking Active   lubiprostone (AMITIZA) 24 MCG capsule [id number] Yes TAKE 1 CAPSULE BY MOUTH TWICE DAILY WITH A MEAL Susy Frizzle, MD Taking Active   omeprazole (PRILOSEC) 20 MG capsule [id number] Yes TAKE 1 CAPSULE BY MOUTH  DAILY Susy Frizzle, MD Taking Active   ondansetron (ZOFRAN ODT) 4 MG disintegrating tablet [id number] Yes Take 1 tablet (4 mg total) by mouth every 8 (eight) hours as needed. Fredia Sorrow, MD Taking Active   polyethylene glycol [facility]) packet [id number] Yes Take 17 g by mouth daily. Hold for diarrhea. Barton Dubois, MD Taking Active   predniSONE (DELTASONE) 20 MG tablet [id number] Yes Take 2 tablets (40 mg total) by mouth daily with breakfast. Alycia Rossetti, MD Taking Active   psyllium (HYDROCIL/METAMUCIL) 95 % PACK [id number] Yes Take 1 packet by mouth 3 (three) times daily. Lavina Hamman, MD Taking Active Self  simvastatin (ZOCOR) 20 MG tablet [id number] Yes TAKE 1 TABLET BY MOUTH ONCE DAILY AT BEDTIME Susy Frizzle, MD Taking Active   XARELTO  15 MG TABS tablet [id number] Yes TAKE 1 TABLET BY MOUTH  DAILY Susy Frizzle, MD Taking Active   Med List Note Launa Grill, LPN 56/38/93 7342): l          Patient Active Problem List   Diagnosis Date Noted  . Atrial fibrillation ([place])   . Acute respiratory failure with hypoxia ([place]) 10/22/2017  . Uncontrolled type 2 diabetes mellitus with hyperglycemia ([place]) 10/22/2017  . Atrial fibrillation, chronic 10/22/2017  . Acute on chronic respiratory failure ([place]) 10/22/2017  . Persistent atrial fibrillation ([place])   . Acute on chronic respiratory failure with hypoxia ([place]) 01/13/2017  . Diabetic peripheral neuropathy associated with type 2 diabetes mellitus ([place]) 05/11/2016  . PAH (pulmonary artery hypertension) ([place])   . History of atrial septal defect repair   . Leukocytosis 04/06/2016  . Diastolic CHF, acute on chronic (HCC) 04/05/2016  . SBO (small bowel obstruction) ([place]) 02/17/2016  . Chronic diastolic congestive heart failure ([place]) 02/17/2016  . Diabetes mellitus with complication ([place]) 87/68/1157  . Pyuria 02/17/2016  . GERD (gastroesophageal reflux disease) 04/05/2015  . Acute diverticulitis 05/03/2014  . Bradycardia 04/02/2013  . Hyperkalemia 01/22/2013  . Anemia of chronic disease 01/21/2013  . Acute bronchitis 01/20/2013  . CKD (chronic kidney disease) stage 3, GFR 30-59 ml/min (HCC) 12/25/2012  . Screening for colorectal cancer 12/25/2012  . Breast cancer screening 12/25/2012  . Vitamin D deficiency   . Osteoporosis   . Postop check 08/13/2012  .  Appendiceal abscess 07/30/2012  . Fever, unspecified 07/19/2012  . Abdominal pain 07/19/2012  . SOB (shortness of breath) 07/19/2012  . ARF (acute renal failure) ([place]) 07/19/2012  . Abscess, appendix 07/19/2012  . Type 2 diabetes with nephropathy ([place]) 02/02/2011  . Pulmonary hypertension ([place]) 02/02/2011  . COPD exacerbation ([place]) 02/02/2011  . Hypokalemia 02/02/2011  . Normocytic anemia 02/02/2011  . Essential  hypertension   . Hyperlipidemia     Immunization History  Administered Date(s) Administered  . Fluad Quad(high Dose 65+) 10/11/2018, 12/03/2019  . Influenza Whole 11/30/2009  . Influenza, High Dose Seasonal PF 10/23/2017  . Influenza,inj,Quad PF,6+ Mos 12/16/2012, 10/03/2013, 10/05/2014, 10/18/2015, 09/20/2016  . Janssen (J&J) SARS-COV-2 Vaccination 07/09/2019  . Pneumococcal Conjugate-13 12/25/2012  . Pneumococcal Polysaccharide-23 11/30/2009  . Tdap 12/25/2012    Conditions to be addressed/monitored:  Hypertension, CHF, Afib, COPD, GERD, Type II DM, CKD, osteoporosis, hyperlipidemia,.   Care Plan : General Pharmacy (Adult)  Updates made by Edythe Clarity, [facility] since 05/21/2020 12:00 AM    Problem: Hypertension, CHF, Afib, COPD, GERD, Type II DM, CKD, osteoporosis, hyperlipidemia,.   Priority: High  Onset Date: 05/21/2020    Long-Range Goal: Patient-Specific Goal   Start Date: 05/20/2020  Expected End Date: 11/20/2020  This Visit's Progress: On track  Priority: High  Note:   Current Barriers:  . Unable to independently monitor therapeutic efficacy  Pharmacist Clinical Goal(s):  Marland Kitchen Patient will achieve adherence to monitoring guidelines and medication adherence to achieve therapeutic efficacy . maintain control of blood pressure as evidenced by home monitoring  . contact provider office for questions/concerns as evidenced notation of same in electronic health record through collaboration with PharmD and provider.   Interventions: . 1:1 collaboration with Susy Frizzle, MD regarding development and update of comprehensive plan of care as evidenced by provider attestation and co-signature . Inter-disciplinary care team collaboration (see longitudinal plan of care) . Comprehensive medication review performed; medication list updated in electronic medical record  Hypertension (BP goal <130/80) -Controlled -Current treatment:  amlodipine $RemoveBef'5mg'IquVsjebzf$  daily  carvedilol 12.$RemoveBefore'5mg'eyXuxHivNRnsy$   twice daily,  clonidine 0.$RemoveBefo'3mg'mqzeikIYaMF$  -Medications previously tried: lisinopril,  Benazepril, clonidine patch -Current home readings: 137/69, only reading given - states mostly normal, office BP is also controlled  -Denies hypotensive/hypertensive symptoms -Educated on BP goals and benefits of medications for prevention of heart attack, stroke and kidney damage; Importance of home blood pressure monitoring; Symptoms of hypotension and importance of maintaining adequate hydration; -Counseled to monitor BP at home a few times per week, document, and provide log at future appointments -Recommended to continue current medication Recommended to check BP at home 2-3 times per week  Hyperlipidemia: (LDL goal < 70) -Controlled -Current treatment: . Simvastatin $RemoveBefor'20mg'APinJhkFwtSk$  -Medications previously tried: none noted  -Educated on Cholesterol goals;  Benefits of statin for ASCVD risk reduction; Importance of limiting foods high in cholesterol;  -Most recent LDL is well controlled -Taking medication appropriately -Recommended to continue current medication  Diabetes (A1c goal <7%) -Not ideally controlled -Current medications:  Levemir 100units/ml 15 units at bedtime,   Humalog 7 units twice day lunch and supper -Medications previously tried: pioglitazone  -Current home glucose readings . fasting glucose: 100-150s  -Denies hypoglycemic/hyperglycemic symptoms -Current exercise: minimal -Educated on A1c and blood sugar goals; Prevention and management of hypoglycemic episodes; Benefits of routine self-monitoring of blood sugar; -Counseled to check feet daily and get yearly eye exams -Has not had any episodes of hypoglycemia -Recommended to continue current medication  -Recommended continuing to watch carbohydrates and  sugars and eat regular meals throughout the day to avoid any hypoglycemia  Atrial Fibrillation (Goal: prevent stroke and major bleeding) -Controlled -Current treatment: . Rate  control: Carvdeilol 12.5mg  BID . Anticoagulation: Xarelto 15mg  daily -Medications previously tried: none ntoed -Home BP and HR readings: "controlled"  -Counseled on increased risk of stroke due to Afib and benefits of anticoagulation for stroke prevention; bleeding risk associated with Xarelto and importance of self-monitoring for signs/symptoms of bleeding; avoidance of NSAIDs due to increased bleeding risk with anticoagulants; -Recommended to continue current medication  Heart Failure (Goal: manage symptoms and prevent exacerbations) -Controlled -Last ejection fraction: 55-60%  -HF type: Diastolic  -Current treatment: . Furosemide 20mg  daily -Medications previously tried: none noted -Current home BP/HR readings: see above  -Educated on Benefits of medications for managing symptoms and prolonging life Importance of weighing daily; if you gain more than 3 pounds in one day or 5 pounds in one week, contact providers Importance of blood pressure control -Recommended to continue current medication  COPD (Goal: control symptoms and prevent exacerbations) -Controlled -Current treatment   Trelegy ellipta  Duoneb inhaler -Medications previously tried: Combivent  -Exacerbations requiring treatment in last 6 months: none -Patient reports consistent use of maintenance inhaler -Frequency of rescue inhaler use: very infrequent, once or twice in the last month -Counseled on Pro[place] inhaler technique; Benefits of consistent maintenance inhaler use When to use rescue inhaler Differences between maintenance and rescue inhalers -Recommended to continue current medication   Patient Goals/Self-Care Activities . Patient will:  - take medications as prescribed check glucose daily, document, and provide at future appointments check blood pressure daily, document, and provide at future appointments weigh daily, and contact provider if weight gain of 3 lbs in one day or 5 lbs in one  week  Follow Up Plan: The care management team will reach out to the patient again over the next 120 days.        Medication Assistance: None required.  Patient affirms current coverage meets needs.  Patient's preferred pharmacy is:  [place], [place] - [address] [place] [address] Phone: [phone] Fax: [phone]  [place], [address], [address] [place], [address] [place] [address] Phone: [phone] Fax: [phone]  Uses pill box? Yes Pt endorses 100% compliance  We discussed: Benefits of medication synchronization, packaging and delivery as well as enhanced pharmacist oversight with [employer]. Patient decided to: Continue current medication management strategy  Care Plan and Follow Up Patient Decision:  Patient agrees to Care Plan and Follow-up.  Plan: The care management team will reach out to the patient again over the next 120 days.  Beverly Milch, PharmD Clinical Pharmacist [place] [phone]

## 2020-05-14 MED ORDER — LUBIPROSTONE 24 MCG PO CAPS: ORAL_CAPSULE | ORAL | 1 refills | Status: DC

## 2020-05-18 ENCOUNTER — Telehealth: Payer: Self-pay

## 2020-05-20 ENCOUNTER — Ambulatory Visit (INDEPENDENT_AMBULATORY_CARE_PROVIDER_SITE_OTHER): Payer: Medicare Other | Admitting: Pharmacist

## 2020-05-20 DIAGNOSIS — I5032 Chronic diastolic (congestive) heart failure: Secondary | ICD-10-CM

## 2020-05-20 DIAGNOSIS — E1165 Type 2 diabetes mellitus with hyperglycemia: Secondary | ICD-10-CM | POA: Diagnosis not present

## 2020-05-20 DIAGNOSIS — E785 Hyperlipidemia, unspecified: Secondary | ICD-10-CM | POA: Diagnosis not present

## 2020-05-20 DIAGNOSIS — I482 Chronic atrial fibrillation, unspecified: Secondary | ICD-10-CM | POA: Diagnosis not present

## 2020-05-20 DIAGNOSIS — I10 Essential (primary) hypertension: Secondary | ICD-10-CM | POA: Diagnosis not present

## 2020-05-21 NOTE — Patient Instructions (Addendum)
Visit Information  Goals Addressed            This Visit's Progress   . Track and Manage My Blood Pressure-Hypertension       Timeframe:  Long-Range Goal Priority:  High Start Date: 05/21/20                             Expected End Date: 11/20/20                      Follow Up Date 08/22/20   - check blood pressure 3 times per week - choose a place to take my blood pressure (home, clinic or office, retail store) - write blood pressure results in a log or diary    Why is this important?    You won't feel high blood pressure, but it can still hurt your blood vessels.   High blood pressure can cause heart or kidney problems. It can also cause a stroke.   Making lifestyle changes like losing a little weight or eating less salt will help.   Checking your blood pressure at home and at different times of the day can help to control blood pressure.   If the doctor prescribes medicine remember to take it the way the doctor ordered.   Call the office if you cannot afford the medicine or if there are questions about it.     Notes:       Patient Care Plan: General Pharmacy (Adult)    Problem Identified: Hypertension, CHF, Afib, COPD, GERD, Type II DM, CKD, osteoporosis, hyperlipidemia,.   Priority: High  Onset Date: 05/21/2020    Long-Range Goal: Patient-Specific Goal   Start Date: 05/20/2020  Expected End Date: 11/20/2020  This Visit's Progress: On track  Priority: High  Note:   Current Barriers:  . Unable to independently monitor therapeutic efficacy  Pharmacist Clinical Goal(s):  Marland Kitchen Patient will achieve adherence to monitoring guidelines and medication adherence to achieve therapeutic efficacy . maintain control of blood pressure as evidenced by home monitoring  . contact provider office for questions/concerns as evidenced notation of same in electronic health record through collaboration with PharmD and provider.   Interventions: . 1:1 collaboration with Susy Frizzle, MD regarding development and update of comprehensive plan of care as evidenced by provider attestation and co-signature . Inter-disciplinary care team collaboration (see longitudinal plan of care) . Comprehensive medication review performed; medication list updated in electronic medical record  Hypertension (BP goal <130/80) -Controlled -Current treatment:  amlodipine '5mg'$  daily  carvedilol 12.'5mg'$  twice daily,  clonidine 0.'3mg'$  -Medications previously tried: lisinopril,  Benazepril, clonidine patch -Current home readings: 137/69, only reading given - states mostly normal, office BP is also controlled  -Denies hypotensive/hypertensive symptoms -Educated on BP goals and benefits of medications for prevention of heart attack, stroke and kidney damage; Importance of home blood pressure monitoring; Symptoms of hypotension and importance of maintaining adequate hydration; -Counseled to monitor BP at home a few times per week, document, and provide log at future appointments -Recommended to continue current medication Recommended to check BP at home 2-3 times per week  Hyperlipidemia: (LDL goal < 70) -Controlled -Current treatment: . Simvastatin '20mg'$  -Medications previously tried: none noted  -Educated on Cholesterol goals;  Benefits of statin for ASCVD risk reduction; Importance of limiting foods high in cholesterol;  -Most recent LDL is well controlled -Taking medication appropriately -Recommended to continue current medication  Diabetes (A1c  goal <7%) -Not ideally controlled -Current medications:  Levemir 100units/ml 15 units at bedtime,   Humalog 7 units twice day lunch and supper -Medications previously tried: pioglitazone  -Current home glucose readings . fasting glucose: 100-150s  -Denies hypoglycemic/hyperglycemic symptoms -Current exercise: minimal -Educated on A1c and blood sugar goals; Prevention and management of hypoglycemic episodes; Benefits of  routine self-monitoring of blood sugar; -Counseled to check feet daily and get yearly eye exams -Has not had any episodes of hypoglycemia -Recommended to continue current medication  -Recommended continuing to watch carbohydrates and sugars and eat regular meals throughout the day to avoid any hypoglycemia  Atrial Fibrillation (Goal: prevent stroke and major bleeding) -Controlled -Current treatment: . Rate control: Carvdeilol 12.'5mg'$  BID . Anticoagulation: Xarelto '15mg'$  daily -Medications previously tried: none ntoed -Home BP and HR readings: "controlled"  -Counseled on increased risk of stroke due to Afib and benefits of anticoagulation for stroke prevention; bleeding risk associated with Xarelto and importance of self-monitoring for signs/symptoms of bleeding; avoidance of NSAIDs due to increased bleeding risk with anticoagulants; -Recommended to continue current medication  Heart Failure (Goal: manage symptoms and prevent exacerbations) -Controlled -Last ejection fraction: 55-60%  -HF type: Diastolic  -Current treatment: . Furosemide '20mg'$  daily -Medications previously tried: none noted -Current home BP/HR readings: see above  -Educated on Benefits of medications for managing symptoms and prolonging life Importance of weighing daily; if you gain more than 3 pounds in one day or 5 pounds in one week, contact providers Importance of blood pressure control -Recommended to continue current medication  COPD (Goal: control symptoms and prevent exacerbations) -Controlled -Current treatment   Trelegy ellipta  Duoneb inhaler -Medications previously tried: Combivent  -Exacerbations requiring treatment in last 6 months: none -Patient reports consistent use of maintenance inhaler -Frequency of rescue inhaler use: very infrequent, once or twice in the last month -Counseled on Proper inhaler technique; Benefits of consistent maintenance inhaler use When to use rescue  inhaler Differences between maintenance and rescue inhalers -Recommended to continue current medication   Patient Goals/Self-Care Activities . Patient will:  - take medications as prescribed check glucose daily, document, and provide at future appointments check blood pressure daily, document, and provide at future appointments weigh daily, and contact provider if weight gain of 3 lbs in one day or 5 lbs in one week  Follow Up Plan: The care management team will reach out to the patient again over the next 120 days.        The patient verbalized understanding of instructions, educational materials, and care plan provided today and agreed to receive a mailed copy of patient instructions, educational materials, and care plan.  Telephone follow up appointment with pharmacy team member scheduled for: 4 months  Laura Best, [facility]  Heart Failure, Self-Care Heart failure is a serious condition. The following information explains the things you need to do to take care of yourself after a heart failure diagnosis. You may be asked to change your diet, take certain medicines, and make other lifestyle changes in order to stay as healthy as possible. Your health care provider may also give you more specific instructions. If you have problems or questions, contact your health care provider. What are the risks? Having heart failure puts you at higher risk for certain problems. These problems can get worse if you do not take good care of yourself. Problems may include:  Damage to the kidneys, liver, or lungs.  Malnutrition.  Abnormal heart rhythms.  Blood clotting issues that could cause  a stroke. Supplies needed:  Scale for monitoring weight.  Blood pressure monitor.  Notebook.  Medicines. How to care for yourself when you have heart failure Medicines Take over-the-counter and prescription medicines only as told by your health care provider. Medicines reduce the workload of your  heart, slow the progression of heart failure, and improve symptoms. Take your medicines every day.  Do not stop taking your medicine unless your health care provider tells you to do so.  Do not skip any dose of medicine.  Refill your prescriptions before you run out of medicine.  Talk with your health care provider if you cannot afford your medicines. Eating and drinking  Eat heart-healthy foods. Talk with a dietitian to make an eating plan that is right for you. ? Limit salt (sodium) if told by your health care provider. Sodium restriction may reduce symptoms of heart failure. Ask a dietitian to recommend heart-healthy seasonings. ? Use healthy cooking methods instead of frying. Healthy methods include roasting, grilling, broiling, baking, poaching, steaming, and stir-frying. ? Choose foods that contain no trans fat and are low in saturated fat and cholesterol. Healthy choices include fresh or frozen fruits and vegetables, fish, lean meats, legumes, fat-free or low-fat dairy products, and whole-grain or high-fiber foods.  Limit your fluid intake, if directed by your health care provider. Fluid restriction may reduce symptoms of heart failure.   Alcohol use  Do not drink alcohol if: ? Your health care provider tells you not to drink. ? Your heart was damaged by alcohol, or you have severe heart failure. ? You are pregnant, may be pregnant, or are planning to become pregnant.  If you drink alcohol: ? Limit how much you have to:  0-1 drink a day for women.  0-2 drinks a day for men. ? Know how much alcohol is in your drink. In the [place] one drink equals one 12 oz bottle of beer (355 mL), one 5 oz glass of wine (148 mL), or one 1 oz glass of hard liquor (44 mL). Lifestyle  Do not use any products that contain nicotine or tobacco. These products include cigarettes, chewing tobacco, and vaping devices, such as e-cigarettes. If you need help quitting, ask your health care provider. ? Do  not use nicotine gum or patches before talking to your health care provider.  Do not use illegal drugs.  Work with your health care provider to safely reach the right body weight.  Do physical activity if told by your health care provider. Talk to your health care provider before you begin an exercise if: ? You are an older adult. ? You have severe heart failure.  Learn to manage stress. If you need help to do this, ask your health care provider.  Participate in or seek physical rehabilitation as needed to keep or improve your independence and quality of life.  Participate in a cardiac rehabilitation program, which is a treatment program to improve your health and well-being through exercise training, education, and counseling.  Plan rest periods when you get tired.   Monitoring important information  Weigh yourself every day. This will help you to notice if too much fluid is building up in your body. ? Weigh yourself every morning after you urinate and before you eat breakfast. ? Wear the same amount of clothing each time you weigh yourself. ? Record your daily weight. Provide your health care provider with your weight record.  Monitor and record your pulse and blood pressure as told by your  health care provider.   Dealing with extreme temperatures  If the weather is extremely hot: ? Avoid vigorous physical activity. ? Use air conditioning or fans, or find a cooler location. ? Avoid caffeine and alcohol. ? Wear loose-fitting, lightweight, and light-colored clothing.  If the weather is extremely cold: ? Avoid vigorous activity. ? Layer your clothes. ? Wear mittens or gloves, a hat, and a face covering when you go outside. ? Avoid alcohol. Follow these instructions at home:  Stay up to date with vaccines. Pneumococcal and flu (influenza) vaccines are especially important in preventing infections of the airways.  Keep all follow-up visits. This is important. Contact a health  care provider if you:  Gain 2-3 lb (1-1.4 kg) in 24 hours or 5 lb (2.3 kg) in a week.  Have increasing shortness of breath.  Are unable to participate in your usual physical activities.  Get tired easily.  Cough more than normal, especially with physical activity.  Lose your appetite or feel nauseous.  Have any swelling or more swelling in areas such as your hands, feet, ankles, or abdomen.  Are unable to sleep because it is hard to breathe.  Feel like your heart is beating quickly (palpitations).  Become dizzy or light-headed when you stand up.  Have feelings of depression or sadness. Get help right away if you:  Have trouble breathing.  Notice, or your family notices, a change in your awareness, such as having trouble staying awake or concentrating.  Have pain or discomfort in your chest.  Have an episode of fainting (syncope). These symptoms may represent a serious problem that is an emergency. Do not wait to see if the symptoms will go away. Get medical help right away. Call your local emergency services (911 in the [place] Do not drive yourself to the hospital. Summary  Heart failure is a serious condition. To care for yourself, you may be asked to change your diet, take certain medicines, and make other lifestyle changes.  Take your medicines every day. Do not stop taking them unless your health care provider tells you to do so.  Limit salt and eat heart-healthy foods, such as fresh or frozen fruits and vegetables, fish, lean meats, legumes, fat-free or low-fat dairy products, and whole-grain or high-fiber foods.  Ask your health care provider if you have any alcohol restrictions. You may have to stop drinking alcohol if you have severe heart failure.  Contact your health care provider if you notice problems, such as rapid weight gain or a fast heartbeat. Get help right away if you faint or have chest pain or trouble breathing. This information is not intended to  replace advice given to you by your health care provider. Make sure you discuss any questions you have with your health care provider. Document Revised: 08/02/2019 Document Reviewed: 08/02/2019 Elsevier Patient Education  [employer].

## 2020-05-23 DIAGNOSIS — I517 Cardiomegaly: Secondary | ICD-10-CM | POA: Diagnosis not present

## 2020-05-23 DIAGNOSIS — S299XXA Unspecified injury of thorax, initial encounter: Secondary | ICD-10-CM | POA: Diagnosis not present

## 2020-05-23 DIAGNOSIS — S59912A Unspecified injury of left forearm, initial encounter: Secondary | ICD-10-CM | POA: Diagnosis not present

## 2020-05-23 DIAGNOSIS — Z23 Encounter for immunization: Secondary | ICD-10-CM | POA: Diagnosis not present

## 2020-05-23 DIAGNOSIS — M79632 Pain in left forearm: Secondary | ICD-10-CM | POA: Diagnosis not present

## 2020-05-23 DIAGNOSIS — R0789 Other chest pain: Secondary | ICD-10-CM | POA: Diagnosis not present

## 2020-05-23 DIAGNOSIS — S51812A Laceration without foreign body of left forearm, initial encounter: Secondary | ICD-10-CM | POA: Diagnosis not present

## 2020-05-23 DIAGNOSIS — G8911 Acute pain due to trauma: Secondary | ICD-10-CM | POA: Diagnosis not present

## 2020-05-23 DIAGNOSIS — R079 Chest pain, unspecified: Secondary | ICD-10-CM | POA: Diagnosis not present

## 2020-05-23 DIAGNOSIS — S51811A Laceration without foreign body of right forearm, initial encounter: Secondary | ICD-10-CM | POA: Diagnosis not present

## 2020-05-24 DIAGNOSIS — S299XXA Unspecified injury of thorax, initial encounter: Secondary | ICD-10-CM | POA: Diagnosis not present

## 2020-05-24 DIAGNOSIS — M79632 Pain in left forearm: Secondary | ICD-10-CM | POA: Diagnosis not present

## 2020-05-24 DIAGNOSIS — R079 Chest pain, unspecified: Secondary | ICD-10-CM | POA: Diagnosis not present

## 2020-05-24 DIAGNOSIS — S59912A Unspecified injury of left forearm, initial encounter: Secondary | ICD-10-CM | POA: Diagnosis not present

## 2020-06-03 ENCOUNTER — Other Ambulatory Visit: Payer: Self-pay | Admitting: Family Medicine

## 2020-06-08 MED ORDER — LINACLOTIDE 145 MCG PO CAPS: 145.0000 ug | ORAL_CAPSULE | Freq: Every day | ORAL | 3 refills | Status: AC

## 2020-06-24 ENCOUNTER — Inpatient Hospital Stay (HOSPITAL_COMMUNITY)
Admission: EM | Admit: 2020-06-24 | Discharge: 2020-06-26 | DRG: 291 | Disposition: A | Discharge status: Home / Self Care | Payer: Medicare Other | Attending: Internal Medicine | Admitting: Internal Medicine

## 2020-06-24 ENCOUNTER — Telehealth: Payer: Self-pay

## 2020-06-24 ENCOUNTER — Emergency Department (HOSPITAL_COMMUNITY): Payer: Medicare Other

## 2020-06-24 ENCOUNTER — Encounter (HOSPITAL_COMMUNITY): Payer: Self-pay | Admitting: *Deleted

## 2020-06-24 ENCOUNTER — Other Ambulatory Visit: Payer: Self-pay

## 2020-06-24 DIAGNOSIS — Z794 Long term (current) use of insulin: Secondary | ICD-10-CM | POA: Diagnosis not present

## 2020-06-24 DIAGNOSIS — J810 Acute pulmonary edema: Secondary | ICD-10-CM

## 2020-06-24 DIAGNOSIS — I2510 Atherosclerotic heart disease of native coronary artery without angina pectoris: Secondary | ICD-10-CM | POA: Diagnosis not present

## 2020-06-24 DIAGNOSIS — I482 Chronic atrial fibrillation, unspecified: Secondary | ICD-10-CM | POA: Diagnosis not present

## 2020-06-24 DIAGNOSIS — Z20822 Contact with and (suspected) exposure to covid-19: Secondary | ICD-10-CM | POA: Diagnosis present

## 2020-06-24 DIAGNOSIS — I10 Essential (primary) hypertension: Secondary | ICD-10-CM | POA: Diagnosis not present

## 2020-06-24 DIAGNOSIS — Z7951 Long term (current) use of inhaled steroids: Secondary | ICD-10-CM | POA: Diagnosis not present

## 2020-06-24 DIAGNOSIS — E1121 Type 2 diabetes mellitus with diabetic nephropathy: Secondary | ICD-10-CM | POA: Diagnosis present

## 2020-06-24 DIAGNOSIS — I5033 Acute on chronic diastolic (congestive) heart failure: Secondary | ICD-10-CM | POA: Diagnosis present

## 2020-06-24 DIAGNOSIS — Z79899 Other long term (current) drug therapy: Secondary | ICD-10-CM | POA: Diagnosis not present

## 2020-06-24 DIAGNOSIS — G8929 Other chronic pain: Secondary | ICD-10-CM | POA: Diagnosis not present

## 2020-06-24 DIAGNOSIS — R079 Chest pain, unspecified: Secondary | ICD-10-CM | POA: Diagnosis not present

## 2020-06-24 DIAGNOSIS — I517 Cardiomegaly: Secondary | ICD-10-CM | POA: Diagnosis not present

## 2020-06-24 DIAGNOSIS — Z833 Family history of diabetes mellitus: Secondary | ICD-10-CM

## 2020-06-24 DIAGNOSIS — Z87891 Personal history of nicotine dependence: Secondary | ICD-10-CM | POA: Diagnosis not present

## 2020-06-24 DIAGNOSIS — Z888 Allergy status to other drugs, medicaments and biological substances status: Secondary | ICD-10-CM | POA: Diagnosis not present

## 2020-06-24 DIAGNOSIS — I4891 Unspecified atrial fibrillation: Secondary | ICD-10-CM | POA: Diagnosis not present

## 2020-06-24 DIAGNOSIS — E1122 Type 2 diabetes mellitus with diabetic chronic kidney disease: Secondary | ICD-10-CM | POA: Diagnosis present

## 2020-06-24 DIAGNOSIS — I4819 Other persistent atrial fibrillation: Secondary | ICD-10-CM | POA: Diagnosis not present

## 2020-06-24 DIAGNOSIS — Z886 Allergy status to analgesic agent status: Secondary | ICD-10-CM

## 2020-06-24 DIAGNOSIS — K449 Diaphragmatic hernia without obstruction or gangrene: Secondary | ICD-10-CM | POA: Diagnosis not present

## 2020-06-24 DIAGNOSIS — J449 Chronic obstructive pulmonary disease, unspecified: Secondary | ICD-10-CM | POA: Diagnosis not present

## 2020-06-24 DIAGNOSIS — N1832 Chronic kidney disease, stage 3b: Secondary | ICD-10-CM | POA: Diagnosis present

## 2020-06-24 DIAGNOSIS — J9601 Acute respiratory failure with hypoxia: Secondary | ICD-10-CM | POA: Diagnosis present

## 2020-06-24 DIAGNOSIS — Z7901 Long term (current) use of anticoagulants: Secondary | ICD-10-CM | POA: Diagnosis not present

## 2020-06-24 DIAGNOSIS — N1831 Chronic kidney disease, stage 3a: Secondary | ICD-10-CM | POA: Diagnosis not present

## 2020-06-24 DIAGNOSIS — I130 Hypertensive heart and chronic kidney disease with heart failure and stage 1 through stage 4 chronic kidney disease, or unspecified chronic kidney disease: Principal | ICD-10-CM | POA: Diagnosis present

## 2020-06-24 DIAGNOSIS — N183 Chronic kidney disease, stage 3 unspecified: Secondary | ICD-10-CM | POA: Diagnosis present

## 2020-06-24 DIAGNOSIS — Z7952 Long term (current) use of systemic steroids: Secondary | ICD-10-CM

## 2020-06-24 DIAGNOSIS — Z808 Family history of malignant neoplasm of other organs or systems: Secondary | ICD-10-CM | POA: Diagnosis not present

## 2020-06-24 DIAGNOSIS — J811 Chronic pulmonary edema: Secondary | ICD-10-CM | POA: Diagnosis not present

## 2020-06-24 DIAGNOSIS — R404 Transient alteration of awareness: Secondary | ICD-10-CM | POA: Diagnosis not present

## 2020-06-24 DIAGNOSIS — R6889 Other general symptoms and signs: Secondary | ICD-10-CM | POA: Diagnosis not present

## 2020-06-24 DIAGNOSIS — R0902 Hypoxemia: Secondary | ICD-10-CM | POA: Diagnosis not present

## 2020-06-24 DIAGNOSIS — I499 Cardiac arrhythmia, unspecified: Secondary | ICD-10-CM | POA: Diagnosis not present

## 2020-06-24 DIAGNOSIS — K5790 Diverticulosis of intestine, part unspecified, without perforation or abscess without bleeding: Secondary | ICD-10-CM | POA: Diagnosis not present

## 2020-06-24 DIAGNOSIS — Z743 Need for continuous supervision: Secondary | ICD-10-CM | POA: Diagnosis not present

## 2020-06-24 LAB — BASIC METABOLIC PANEL
Anion gap: 9 (ref 5–15)
BUN: 24 mg/dL — ABNORMAL HIGH (ref 8–23)
CO2: 26 mmol/L (ref 22–32)
Calcium: 9 mg/dL (ref 8.9–10.3)
Chloride: 104 mmol/L (ref 98–111)
Creatinine, Ser: 1.34 mg/dL — ABNORMAL HIGH (ref 0.44–1.00)
GFR, Estimated: 40 mL/min — ABNORMAL LOW (ref >60)
Glucose, Bld: 161 mg/dL — ABNORMAL HIGH (ref 70–99)
Potassium: 3.9 mmol/L (ref 3.5–5.1)
Sodium: 139 mmol/L (ref 135–145)

## 2020-06-24 LAB — HEPATIC FUNCTION PANEL
ALT: 13 U/L (ref 0–44)
AST: 16 U/L (ref 15–41)
Albumin: 3.7 g/dL (ref 3.5–5.0)
Alkaline Phosphatase: 102 U/L (ref 38–126)
Bilirubin, Direct: 0.1 mg/dL (ref 0.0–0.2)
Indirect Bilirubin: 0.5 mg/dL (ref 0.3–0.9)
Total Bilirubin: 0.6 mg/dL (ref 0.3–1.2)
Total Protein: 7.1 g/dL (ref 6.5–8.1)

## 2020-06-24 LAB — CBC
HCT: 40.1 % (ref 36.0–46.0)
Hemoglobin: 12.8 g/dL (ref 12.0–15.0)
MCH: 28.8 pg (ref 26.0–34.0)
MCHC: 31.9 g/dL (ref 30.0–36.0)
MCV: 90.3 fL (ref 80.0–100.0)
Platelets: 204 10*3/uL (ref 150–400)
RBC: 4.44 MIL/uL (ref 3.87–5.11)
RDW: 15.1 % (ref 11.5–15.5)
WBC: 8.4 10*3/uL (ref 4.0–10.5)
nRBC: 0 % (ref 0.0–0.2)

## 2020-06-24 LAB — RESP PANEL BY RT-PCR (FLU A&B, COVID) ARPGX2
Influenza A by PCR: NEGATIVE
Influenza B by PCR: NEGATIVE
SARS Coronavirus 2 by RT PCR: NEGATIVE

## 2020-06-24 LAB — TROPONIN I (HIGH SENSITIVITY)
Troponin I (High Sensitivity): 8 ng/L (ref <18)
Troponin I (High Sensitivity): 15 ng/L (ref <18)

## 2020-06-24 LAB — GLUCOSE, CAPILLARY
Glucose-Capillary: 115 mg/dL — ABNORMAL HIGH (ref 70–99)
Glucose-Capillary: 179 mg/dL — ABNORMAL HIGH (ref 70–99)

## 2020-06-24 LAB — BRAIN NATRIURETIC PEPTIDE: B Natriuretic Peptide: 465 pg/mL — ABNORMAL HIGH (ref 0.0–100.0)

## 2020-06-24 LAB — LIPASE, BLOOD: Lipase: 21 U/L (ref 11–51)

## 2020-06-24 MED ORDER — FUROSEMIDE 10 MG/ML IJ SOLN
20.0000 mg | Freq: Once | INTRAMUSCULAR | Status: AC
  Administered 2020-06-24: 20 mg via INTRAVENOUS
  Filled 2020-06-24: qty 2

## 2020-06-24 MED ORDER — POLYETHYLENE GLYCOL 3350 17 G PO PACK: 17.0000 g | PACK | Freq: Every day | ORAL | Status: DC | PRN

## 2020-06-24 MED ORDER — AMLODIPINE BESYLATE 5 MG PO TABS
5.0000 mg | ORAL_TABLET | Freq: Every day | ORAL | Status: DC
  Administered 2020-06-25 – 2020-06-26 (×2): 5 mg via ORAL
  Filled 2020-06-24 (×2): qty 1

## 2020-06-24 MED ORDER — GABAPENTIN 100 MG PO CAPS
100.0000 mg | ORAL_CAPSULE | Freq: Every day | ORAL | Status: DC
  Administered 2020-06-24 – 2020-06-25 (×2): 100 mg via ORAL
  Filled 2020-06-24 (×2): qty 1

## 2020-06-24 MED ORDER — ACETAMINOPHEN 325 MG PO TABS
650.0000 mg | ORAL_TABLET | Freq: Four times a day (QID) | ORAL | Status: DC | PRN
  Administered 2020-06-24: 650 mg via ORAL
  Filled 2020-06-24: qty 2

## 2020-06-24 MED ORDER — FEBUXOSTAT 40 MG PO TABS
40.0000 mg | ORAL_TABLET | Freq: Every day | ORAL | Status: DC
  Administered 2020-06-25 – 2020-06-26 (×2): 40 mg via ORAL
  Filled 2020-06-24 (×2): qty 1

## 2020-06-24 MED ORDER — FLUTICASONE PROPIONATE 50 MCG/ACT NA SUSP
2.0000 | Freq: Every day | NASAL | Status: DC
  Administered 2020-06-25 – 2020-06-26 (×2): 2 via NASAL
  Filled 2020-06-24: qty 16

## 2020-06-24 MED ORDER — FLUTICASONE FUROATE-VILANTEROL 100-25 MCG/INH IN AEPB
1.0000 | INHALATION_SPRAY | Freq: Every day | RESPIRATORY_TRACT | Status: DC
  Administered 2020-06-25 – 2020-06-26 (×2): 1 via RESPIRATORY_TRACT
  Filled 2020-06-24: qty 28

## 2020-06-24 MED ORDER — INSULIN ASPART 100 UNIT/ML IJ SOLN: 0.0000 [IU] | Freq: Every day | INTRAMUSCULAR | Status: DC

## 2020-06-24 MED ORDER — CLONIDINE HCL 0.2 MG PO TABS
0.3000 mg | ORAL_TABLET | Freq: Two times a day (BID) | ORAL | Status: DC
  Administered 2020-06-24 – 2020-06-26 (×4): 0.3 mg via ORAL
  Filled 2020-06-24 (×5): qty 1

## 2020-06-24 MED ORDER — INSULIN DETEMIR 100 UNIT/ML ~~LOC~~ SOLN
10.0000 [IU] | Freq: Every day | SUBCUTANEOUS | Status: DC
  Administered 2020-06-24 – 2020-06-25 (×2): 10 [IU] via SUBCUTANEOUS
  Filled 2020-06-24 (×4): qty 0.1

## 2020-06-24 MED ORDER — ONDANSETRON HCL 4 MG PO TABS: 4.0000 mg | ORAL_TABLET | Freq: Four times a day (QID) | ORAL | Status: DC | PRN

## 2020-06-24 MED ORDER — FUROSEMIDE 10 MG/ML IJ SOLN
40.0000 mg | Freq: Two times a day (BID) | INTRAMUSCULAR | Status: DC
  Administered 2020-06-25 – 2020-06-26 (×3): 40 mg via INTRAVENOUS
  Filled 2020-06-24 (×3): qty 4

## 2020-06-24 MED ORDER — CARVEDILOL 12.5 MG PO TABS
12.5000 mg | ORAL_TABLET | Freq: Two times a day (BID) | ORAL | Status: DC
  Administered 2020-06-24 – 2020-06-26 (×4): 12.5 mg via ORAL
  Filled 2020-06-24 (×4): qty 1

## 2020-06-24 MED ORDER — INSULIN ASPART 100 UNIT/ML IJ SOLN
0.0000 [IU] | Freq: Three times a day (TID) | INTRAMUSCULAR | Status: DC
  Administered 2020-06-25 (×2): 2 [IU] via SUBCUTANEOUS
  Administered 2020-06-25: 5 [IU] via SUBCUTANEOUS
  Administered 2020-06-26: 1 [IU] via SUBCUTANEOUS
  Administered 2020-06-26: 3 [IU] via SUBCUTANEOUS

## 2020-06-24 MED ORDER — ACETAMINOPHEN 650 MG RE SUPP: 650.0000 mg | Freq: Four times a day (QID) | RECTAL | Status: DC | PRN

## 2020-06-24 MED ORDER — METOPROLOL TARTRATE 5 MG/5ML IV SOLN: 5.0000 mg | INTRAVENOUS | Status: DC | PRN

## 2020-06-24 MED ORDER — ONDANSETRON HCL 4 MG/2ML IJ SOLN: 4.0000 mg | Freq: Four times a day (QID) | INTRAMUSCULAR | Status: DC | PRN

## 2020-06-24 MED ORDER — RIVAROXABAN 15 MG PO TABS
15.0000 mg | ORAL_TABLET | Freq: Every day | ORAL | Status: DC
  Administered 2020-06-25 – 2020-06-26 (×2): 15 mg via ORAL
  Filled 2020-06-24 (×2): qty 1

## 2020-06-24 MED ORDER — FLUTICASONE-UMECLIDIN-VILANT 100-62.5-25 MCG/INH IN AEPB: 1.0000 | INHALATION_SPRAY | Freq: Every day | RESPIRATORY_TRACT | Status: DC

## 2020-06-24 MED ORDER — UMECLIDINIUM BROMIDE 62.5 MCG/INH IN AEPB
1.0000 | INHALATION_SPRAY | Freq: Every day | RESPIRATORY_TRACT | Status: DC
  Administered 2020-06-25 – 2020-06-26 (×2): 1 via RESPIRATORY_TRACT
  Filled 2020-06-24: qty 7

## 2020-06-24 NOTE — ED Triage Notes (Signed)
EMS reported that pt in Afib.

## 2020-06-24 NOTE — Telephone Encounter (Signed)
Pt called with concern of bp. Says after speaking with nurse from her insurance co, she was instructed to call pcp. bp 170's/82, p 126. Office appts were booked made f/u appt with NP for 6/8.instructed pt to = call ems to get assistance due to not having transpo. Also reported lightheaded. Convinced pt to call ems for assistance, verbalized understanding

## 2020-06-24 NOTE — ED Triage Notes (Signed)
Pt with with HTN at home. EMS reports bp 182/106 and 160/90. Pt contacted PCP and instructed to call EMS.  EMS reports pt with brief episode of CP and emesis x1, IV started to rt AC 20 G and Zofran 4 mg given en route.  Pt denies CP at present.

## 2020-06-24 NOTE — H&P (Signed)
History and Physical    Laura Best FMM:[id number] DOB: [birth year] DOA: 06/24/2020  PCP: Susy Frizzle, MD   Patient coming from: Home  I have personally briefly reviewed patient's old medical records in [place]  Chief Complaint: Dizziness, elevated blood pressure.  HPI: Laura Best is a 80 y.o. female with medical history significant for hypertension, diabetes mellitus, CKD 3, pulmonary hypertension, COPD, diastolic CHF, atrial fibrillation. Patient presented to the ED with complaints of an episode of lightheadedness and chest pain that started earlier today while she was resting.  Blood pressure was also elevated at 170/82.  So she called her primary care provider.  She was directed to the ED as he had no slots available to see her.  On EMS arrival, patient's blood pressure was 182/106 and 160/70. Chest pain was left lower chest/upper abdomen, lasted a few seconds and spontaneously resolved.  Dizziness was present only when standing.  Improved when lying down.  She did not have any difficulty breathing until she presented to the ED. She reports compliance with her medications which include Xarelto and Lasix 60 mg daily.  She is not on home O2.  She tells me her normal weight is 169 pounds  ED Course: Blood pressure 30s to 160s.  Heart rate up to 121.  O2 sats 91 to 96% on room air, dropped to 84% with ambulation. BNP- 465. Trop 8.  Chest x-ray shows new interstitial pulmonary edema.  On exam, EDP elicited left upper quadrant tenderness, hence CT abdomen was obtained and showed bilateral pleural effusion without acute intra-abdominal abnormality. 20 mg IV Lasix given.  Hospitalist to admit.  Review of Systems: As per HPI all other systems reviewed and negative.  Past Medical History:  Diagnosis Date  . Arthritis    "hands, feet" (02/17/2016)  . Atrial fibrillation ([place])   . CAD (coronary artery disease)    a. 05/2016: cath showing nonobstructive CAD with 50% prox-Cx  stenosis  . CKD (chronic kidney disease) stage 3, GFR 30-59 ml/min (HCC) 12/25/2012  . COPD (chronic obstructive pulmonary disease) ([place])   . Diastolic heart failure ([place])   . Hyperlipidemia   . Hypertension   . Osteoporosis   . Pneumonia    "several times" (02/17/2016)  . Small bowel obstruction ([place]) 02/17/2016  . Tricuspid regurgitation    Echo 05/02/06-Nml LV. Mild MR. Mod TR  . Type II diabetes mellitus ([place])   . Vitamin D deficiency     Past Surgical History:  Procedure Laterality Date  . APPENDECTOMY    . HEART CHAMBER REVISION     patched hole --[place]  . LAPAROSCOPIC APPENDECTOMY N/A 09/27/2013   Procedure: APPENDECTOMY - CONVERTED FROM LAPAROSCOPIC;  Surgeon: Donnie Mesa, MD;  Location: [facility];  Service: General;  Laterality: N/A;  . RIGHT/LEFT HEART CATH AND CORONARY ANGIOGRAPHY N/A 05/24/2016   Procedure: Right/Left Heart Cath and Coronary Angiography;  Surgeon: Larey Dresser, MD;  Location: [facility];  Service: Cardiovascular;  Laterality: N/A;  . TUBAL LIGATION       reports that she quit smoking about 11 years ago. Her smoking use included cigarettes. She has a 25.00 pack-year smoking history. She has never used smokeless tobacco. She reports that she does not drink alcohol and does not use drugs.  Allergies  Allergen Reactions  . Ace Inhibitors Other (See Comments)    Hyperkalemia  . Actos [Pioglitazone] Swelling  . Aspirin Other (See Comments)    G.I. Upset.  In high doses  . Metformin And Related Diarrhea  . Eliquis [Apixaban] Rash and Other (See Comments)    Patient states that this med was d/ced by prescriber due to interference with allergies and states that she also had a breakout    Family History  Problem Relation Age of Onset  . Cancer Father   . Diabetes Father   . Cancer Brother        Brain  . Cancer Sister        abdominal fat    Prior to Admission medications   Medication Sig Start Date End Date Taking? Authorizing Provider   ACCU-CHEK GUIDE test strip CHECK BLOOD SUGAR 3 TIMES  DAILY BEFORE MEALS AND AT  BEDTIME 03/16/20   Donita Brooks, MD  Accu-Chek Softclix Lancets lancets CHECK BLOOD SUGAR 3 TIMES  DAILY BEFORE MEALS AND AT  BEDTIME 12/08/19   Donita Brooks, MD  acetaminophen (TYLENOL) 500 MG tablet Take 500 mg by mouth every 6 (six) hours as needed for pain.    [provider]  amLODipine (NORVASC) 10 MG tablet TAKE ONE-HALF TABLET BY  MOUTH DAILY 10/20/19   Donita Brooks, MD  Blood Glucose Monitoring Suppl (ACCU-CHEK GUIDE ME) w/Device KIT USE TO CHECK BLOOD SUGAR 4  TIMES DAILY BEFORE MEALS  AND AT BEDTIME 03/16/20   Donita Brooks, MD  calcitRIOL (ROCALTROL) 0.25 MCG capsule Take 1 capsule (0.25 mcg total) by mouth every Monday, Wednesday, and Friday. 02/11/18   Donita Brooks, MD  carvedilol (COREG) 12.5 MG tablet TAKE 1 TABLET BY MOUTH  TWICE DAILY 12/16/19   Donita Brooks, MD  Cholecalciferol (VITAMIN D) 2000 UNITS CAPS Take 2,000 Units by mouth daily.     [provider]  cloNIDine (CATAPRES) 0.3 MG tablet TAKE 1 TABLET BY MOUTH  TWICE DAILY 12/16/19   Donita Brooks, MD  dicyclomine (BENTYL) 20 MG tablet Take 1 tablet (20 mg total) by mouth every 6 (six) hours as needed for spasms (intestinal spasms). 05/09/19   Donita Brooks, MD  febuxostat (ULORIC) 40 MG tablet TAKE 1 TABLET BY MOUTH  DAILY 12/16/19   Donita Brooks, MD  fluticasone [facility]) 50 MCG/ACT nasal spray Place 2 sprays into both nostrils daily. 05/09/19   Donita Brooks, MD  Fluticasone-Umeclidin-Vilant (TRELEGY ELLIPTA) 100-62.5-25 MCG/INH AEPB Inhale 1 Inhaler into the lungs daily. 02/19/20   Salley Scarlet, MD  furosemide (LASIX) 40 MG tablet TAKE 1 AND 1/2 TABLETS BY  MOUTH DAILY 12/16/19   Donita Brooks, MD  gabapentin (NEURONTIN) 100 MG capsule TAKE 1 TO 2 CAPSULES BY  MOUTH AT BEDTIME FOR  PERIPHERAL NEUROPATHY 12/16/19   Donita Brooks, MD  glucose blood (ACCU-CHEK AVIVA PLUS) test  strip CHECK BLOOD SUGAR 3 TIMES  DAILY BEFORE MEALS AND AT  BEDTIME. 12/08/19   Donita Brooks, MD  Glucose Blood (BLOOD GLUCOSE TEST STRIPS) STRP Please dispense as Accu-Chek Guide Me. Use as directed to monitor FSBS 3x daily. Dx: E11.9. 12/08/19   Salley Scarlet, MD  guaiFENesin (MUCINEX) 600 MG 12 hr tablet Take 1 tablet (600 mg total) by mouth 2 (two) times daily. 02/11/20   Salley Scarlet, MD  HUMALOG KWIKPEN 100 UNIT/ML KwikPen INJECT SUBCUTANEOUSLY 7  UNITS 3 TIMES DAILY 06/04/20   Donita Brooks, MD  insulin detemir (LEVEMIR FLEXTOUCH) 100 UNIT/ML FlexPen Inject 20 Units into the skin at bedtime. SUBCUTANEOUSLY ONCE DAILY AT  10  PM 09/01/19  Susy Frizzle, MD  Insulin Pen Needle (PEN NEEDLES) 31G X 6 MM MISC Use as directed to inject insulin SQ TID. 06/20/19   Susy Frizzle, MD  ipratropium-albuterol (DUONEB) 0.5-2.5 (3) MG/3ML SOLN Take 3 mLs by nebulization every 6 (six) hours as needed. 10/29/17   Orlena Sheldon, PA-C  linaclotide [facility]) 145 MCG CAPS capsule Take 1 capsule (145 mcg total) by mouth daily before breakfast. 06/08/20   Susy Frizzle, MD  omeprazole (PRILOSEC) 20 MG capsule TAKE 1 CAPSULE BY MOUTH  DAILY 12/16/19   Susy Frizzle, MD  ondansetron (ZOFRAN ODT) 4 MG disintegrating tablet Take 1 tablet (4 mg total) by mouth every 8 (eight) hours as needed. 10/05/18   Fredia Sorrow, MD  polyethylene glycol [facility]) packet Take 17 g by mouth daily. Hold for diarrhea. 10/24/17   Barton Dubois, MD  predniSONE (DELTASONE) 20 MG tablet Take 2 tablets (40 mg total) by mouth daily with breakfast. 02/11/20   Alycia Rossetti, MD  psyllium (HYDROCIL/METAMUCIL) 95 % PACK Take 1 packet by mouth 3 (three) times daily. 02/19/16   Lavina Hamman, MD  simvastatin (ZOCOR) 20 MG tablet TAKE 1 TABLET BY MOUTH ONCE DAILY AT BEDTIME 12/16/19   Susy Frizzle, MD  XARELTO 15 MG TABS tablet TAKE 1 TABLET BY MOUTH  DAILY 12/16/19   Susy Frizzle, MD    Physical  Exam: Vitals:   06/24/20 1330 06/24/20 1400 06/24/20 1430 06/24/20 1500  BP: (!) 150/93 (!) 141/73  (!) 168/99  Pulse: (!) 108 (!) 110 (!) 121 (!) 105  Resp: (!) 21 17 (!) 23 (!) 26  Temp:      TempSrc:      SpO2: 96% 94% 92% 93%  Weight:      Height:        Constitutional: NAD, calm, comfortable Vitals:   06/24/20 1330 06/24/20 1400 06/24/20 1430 06/24/20 1500  BP: (!) 150/93 (!) 141/73  (!) 168/99  Pulse: (!) 108 (!) 110 (!) 121 (!) 105  Resp: (!) 21 17 (!) 23 (!) 26  Temp:      TempSrc:      SpO2: 96% 94% 92% 93%  Weight:      Height:       Eyes: PERRL, lids and conjunctivae normal ENMT: Mucous membranes are moist Neck: normal, supple, no masses, no thyromegaly Respiratory: clear to auscultation bilaterally, no wheezing, no crackles. Normal respiratory effort. No accessory muscle use.  Cardiovascular: Tachycardic irregular rate and rhythm, no murmurs / rubs / gallops. No extremity edema. 2+ pedal pulses. Abdomen: Left upper quadrant and right lower abdominal tenderness, no masses palpated. No hepatosplenomegaly. Musculoskeletal: no clubbing / cyanosis. No joint deformity upper and lower extremities. Good ROM, no contractures. Normal muscle tone.  Skin: no rashes, lesions, ulcers. No induration Neurologic: No apparent cranial normality, moving EXTR spontaneously Psychiatric: Normal judgment and insight. Alert and oriented x 3. Normal mood.   Labs on Admission: I have personally reviewed following labs and imaging studies  CBC: Recent Labs  Lab 06/24/20 1257  WBC 8.4  HGB 12.8  HCT 40.1  MCV 90.3  PLT 503   Basic Metabolic Panel: Recent Labs  Lab 06/24/20 1257  NA 139  K 3.9  CL 104  CO2 26  GLUCOSE 161*  BUN 24*  CREATININE 1.34*  CALCIUM 9.0   Liver Function Tests: Recent Labs  Lab 06/24/20 1257  AST 16  ALT 13  ALKPHOS 102  BILITOT 0.6  PROT  7.1  ALBUMIN 3.7   Recent Labs  Lab 06/24/20 1257  LIPASE 21    Radiological Exams on  Admission: CT ABDOMEN PELVIS WO CONTRAST  Result Date: 06/24/2020 CLINICAL DATA:  Chronic right abdominal pain.  Hypertension today. EXAM: CT ABDOMEN AND PELVIS WITHOUT CONTRAST TECHNIQUE: Multidetector CT imaging of the abdomen and pelvis was performed following the standard protocol without IV contrast. COMPARISON:  CT abdomen and pelvis 10/05/2018. FINDINGS: Lower chest: Mild dependent atelectasis is more notable on the left. Trace bilateral pleural effusions. Cardiomegaly. No pericardial effusion. Hepatobiliary: No focal liver abnormality is seen. No gallstones, gallbladder wall thickening, or biliary dilatation. Pancreas: Unremarkable. No pancreatic ductal dilatation or surrounding inflammatory changes. Spleen: Normal in size without focal abnormality. Adrenals/Urinary Tract: Adrenal glands are unremarkable. Kidneys are normal, without renal calculi, focal lesion, or hydronephrosis. Bladder is unremarkable. Stomach/Bowel: Small hiatal hernia noted. Stomach is otherwise within normal limits. Status post appendectomy. No evidence of bowel wall thickening, distention, or inflammatory changes. A few scattered diverticula are seen. Vascular/Lymphatic: Aortic atherosclerosis. No enlarged abdominal or pelvic lymph nodes. Reproductive: Uterus and bilateral adnexa are unremarkable. Other: Small fat containing supraumbilical hernia. Musculoskeletal: No acute or focal abnormality. IMPRESSION: No acute abnormality abdomen or pelvis. Trace bilateral pleural effusions and mild basilar atelectasis. Cardiomegaly. Mild diverticulosis without diverticulitis. Small hiatal hernia and fat containing supraumbilical hernia. Aortic Atherosclerosis (ICD10-I70.0). Electronically Signed   By: Inge Rise M.D.   On: 06/24/2020 14:01   DG Chest Portable 1 View  Result Date: 06/24/2020 CLINICAL DATA:  Chest pain and hypertension. EXAM: PORTABLE CHEST 1 VIEW COMPARISON:  Chest x-ray dated October 21, 2017. FINDINGS: Unchanged  cardiomegaly. New diffuse interstitial opacity. No focal consolidation, pleural effusion, or pneumothorax. No acute osseous abnormality. IMPRESSION: 1. New interstitial pulmonary edema. Electronically Signed   By: Titus Dubin M.D.   On: 06/24/2020 12:59    EKG: Independently reviewed.  Atrial fibrillation rate 126, QTc 493.  No significant change from prior.  Assessment/Plan Principal Problem:   Acute respiratory failure with hypoxia (HCC) Active Problems:   Type 2 diabetes with nephropathy (HCC)   CKD (chronic kidney disease) stage 3, GFR 30-59 ml/min (HCC)   Diastolic CHF, acute on chronic (HCC)   Atrial fibrillation, chronic   Acute respiratory failure with hypoxia-O2 sats 91 to 96% on room air, drops to 84% with ambulation.  Currently on 2 L.  Likely due to decompensated CHF.  Doubt PE, she is compliant with her Xarelto. -Supplemental O2  Acute on chronic diastolic CHF-BNP 150 elevated, similar to prior elevations for CHF.  Chest x-ray shows new interstitial edema.  Blood pressure was elevated, possibly flash pulmonary edema.  Last echo 2019 EF 55 to 60%, LV function not evaluated.  Weights per chart appears stable.  Troponin unremarkable 8. -IV Lasix 40 twice daily -Daily BMP -Strict input output, daily weights -Obtain updated echocardiogram -Trend troponin  Atrial fibrillation-chronic, heart rate 99-121. On anticoagulation -As needed IV metoprolol for heart rate greater than 110 -Resume carvedilol - Resume xarelto  Diabetes mellitus -random glucose 161. - HgbA1c - SSI- S -Resume Levemir reduced dose 10 units nightly (home dose 20u)  CKD 3B- creatinine 1.35 improved compared to baseline. -Monitor with diuresis  HTN-  Elevated-resume clonidine, Norvasc 5 mg  COPD-  Stable .-Resume home bronchodilators   DVT prophylaxis: Xarelto Code Status: Full code Family Communication: None at bedside Disposition Plan:~ 1 - 2 days Consults called: None Admission status:   obs tele  Kristan Votta Arlyce Dice MD  [facility]  06/24/2020, 4:40 PM

## 2020-06-24 NOTE — ED Provider Notes (Signed)
Emergency Department Provider Note   I have reviewed the triage vital signs and the nursing notes.   HISTORY  Chief Complaint Hypertension and Chest Pain   HPI Laura Best is a 80 y.o. female with past medical history reviewed below including persistent atrial fibrillation on anticoagulation, CAD, CKD, presents to the emergency department initially with complaint of elevated blood pressure.  Patient apparently was checking her blood pressure this morning and found it to be in the 170s.  She spoke to her insurance nurse and was directed to her primary care doctor.  During that discussion patient described several seconds of chest discomfort this morning along with some lightheadedness.  She was advised to present to the emergency department and called 911.   She is no longer having chest discomfort or feeling lightheaded.  She states she took her medications this morning and has been compliant with them otherwise.  Denies any vomiting, diarrhea, abdominal pain. No fever/chills.   Past Medical History:  Diagnosis Date  . Arthritis    "hands, feet" (02/17/2016)  . Atrial fibrillation ([place])   . CAD (coronary artery disease)    a. 05/2016: cath showing nonobstructive CAD with 50% prox-Cx stenosis  . CKD (chronic kidney disease) stage 3, GFR 30-59 ml/min (HCC) 12/25/2012  . COPD (chronic obstructive pulmonary disease) ([place])   . Diastolic heart failure ([place])   . Hyperlipidemia   . Hypertension   . Osteoporosis   . Pneumonia    "several times" (02/17/2016)  . Small bowel obstruction ([place]) 02/17/2016  . Tricuspid regurgitation    Echo 05/02/06-Nml LV. Mild MR. Mod TR  . Type II diabetes mellitus ([place])   . Vitamin D deficiency     Patient Active Problem List   Diagnosis Date Noted  . Atrial fibrillation ([place])   . Acute respiratory failure with hypoxia ([place]) 10/22/2017  . Uncontrolled type 2 diabetes mellitus with hyperglycemia ([place]) 10/22/2017  . Atrial fibrillation, chronic  10/22/2017  . Acute on chronic respiratory failure ([place]) 10/22/2017  . Persistent atrial fibrillation ([place])   . Acute on chronic respiratory failure with hypoxia ([place]) 01/13/2017  . Diabetic peripheral neuropathy associated with type 2 diabetes mellitus ([place]) 05/11/2016  . PAH (pulmonary artery hypertension) ([place])   . History of atrial septal defect repair   . Leukocytosis 04/06/2016  . Diastolic CHF, acute on chronic (HCC) 04/05/2016  . SBO (small bowel obstruction) ([place]) 02/17/2016  . Chronic diastolic congestive heart failure ([place]) 02/17/2016  . Diabetes mellitus with complication ([place]) [id number]  . Pyuria 02/17/2016  . GERD (gastroesophageal reflux disease) 04/05/2015  . Acute diverticulitis 05/03/2014  . Bradycardia 04/02/2013  . Hyperkalemia 01/22/2013  . Anemia of chronic disease 01/21/2013  . Acute bronchitis 01/20/2013  . CKD (chronic kidney disease) stage 3, GFR 30-59 ml/min (HCC) 12/25/2012  . Screening for colorectal cancer 12/25/2012  . Breast cancer screening 12/25/2012  . Vitamin D deficiency   . Osteoporosis   . Postop check 08/13/2012  . Appendiceal abscess 07/30/2012  . Fever, unspecified 07/19/2012  . Abdominal pain 07/19/2012  . SOB (shortness of breath) 07/19/2012  . ARF (acute renal failure) ([place]) 07/19/2012  . Abscess, appendix 07/19/2012  . Type 2 diabetes with nephropathy ([place]) 02/02/2011  . Pulmonary hypertension ([place]) 02/02/2011  . COPD exacerbation ([place]) 02/02/2011  . Hypokalemia 02/02/2011  . Normocytic anemia 02/02/2011  . Essential hypertension   . Hyperlipidemia     Past Surgical History:  Procedure Laterality Date  . APPENDECTOMY    .  HEART CHAMBER REVISION     patched hole --[place]  . LAPAROSCOPIC APPENDECTOMY N/A 09/27/2013   Procedure: APPENDECTOMY - CONVERTED FROM LAPAROSCOPIC;  Surgeon: Donnie Mesa, MD;  Location: [facility];  Service: General;  Laterality: N/A;  . RIGHT/LEFT HEART CATH AND CORONARY ANGIOGRAPHY N/A 05/24/2016    Procedure: Right/Left Heart Cath and Coronary Angiography;  Surgeon: Larey Dresser, MD;  Location: [facility];  Service: Cardiovascular;  Laterality: N/A;  . TUBAL LIGATION      Allergies Ace inhibitors, Actos [pioglitazone], Aspirin, Metformin and related, and Eliquis [apixaban]  Family History  Problem Relation Age of Onset  . Cancer Father   . Diabetes Father   . Cancer Brother        Brain  . Cancer Sister        abdominal fat    Social History Social History   Tobacco Use  . Smoking status: Former Smoker    Packs/day: 0.50    Years: 50.00    Pack years: 25.00    Types: Cigarettes    Quit date: 01/19/2009    Years since quitting: 11.4  . Smokeless tobacco: Never Used  Vaping Use  . Vaping Use: Never used  Substance Use Topics  . Alcohol use: No    Alcohol/week: 0.0 standard drinks  . Drug use: No    Review of Systems  Constitutional: No fever/chills. Positive lightheadedness this AM.  Eyes: No visual changes. ENT: No sore throat. Cardiovascular: Brief chest pain episode (resolved).  Respiratory: Denies shortness of breath. Gastrointestinal: No abdominal pain.  No nausea, no vomiting.  No diarrhea.  No constipation. Genitourinary: Negative for dysuria. Musculoskeletal: Negative for back pain. Skin: Negative for rash. Neurological: Negative for headaches, focal weakness or numbness.  10-point ROS otherwise negative.  ____________________________________________   PHYSICAL EXAM:  VITAL SIGNS: ED Triage Vitals  Enc Vitals Group     BP 06/24/20 1232 (!) 148/81     Pulse Rate 06/24/20 1233 (!) 112     Resp 06/24/20 1233 (!) 22     Temp 06/24/20 1234 98.3 F (36.8 C)     Temp Source 06/24/20 1234 Oral     SpO2 06/24/20 1233 91 %     Weight 06/24/20 1229 170 lb (77.1 kg)     Height 06/24/20 1229 '5\' 3"'$  (1.6 m)   Constitutional: Alert and oriented. Well appearing and in no acute distress. Eyes: Conjunctivae are normal.  Head:  Atraumatic. Nose: No congestion/rhinnorhea. Mouth/Throat: Mucous membranes are moist.   Neck: No stridor.  Cardiovascular: A fib. Good peripheral circulation. Grossly normal heart sounds.   Respiratory: Normal respiratory effort.  No retractions. Lungs CTAB. Gastrointestinal: Soft with tenderness to palpation throughout the right abdomen. No rebound but some voluntary guarding noted. Mild distention.  Musculoskeletal: No lower extremity tenderness nor edema. No gross deformities of extremities. Neurologic:  Normal speech and language. No gross focal neurologic deficits are appreciated.  Skin:  Skin is warm, dry and intact. No rash noted.  ____________________________________________   LABS (all labs ordered are listed, but only abnormal results are displayed)  Labs Reviewed  BASIC METABOLIC PANEL - Abnormal; Notable for the following components:      Result Value   Glucose, Bld 161 (*)    BUN 24 (*)    Creatinine, Ser 1.34 (*)    GFR, Estimated 40 (*)    All other components within normal limits  RESP PANEL BY RT-PCR (FLU A&B, COVID) [id number]  CBC  HEPATIC  FUNCTION PANEL  LIPASE, BLOOD  BRAIN NATRIURETIC PEPTIDE  TROPONIN I (HIGH SENSITIVITY)  TROPONIN I (HIGH SENSITIVITY)   ____________________________________________  EKG   EKG Interpretation  Date/Time:  Thursday June 24 2020 12:31:31 EDT Ventricular Rate:  126 PR Interval:    QRS Duration: 137 QT Interval:  340 QTC Calculation: 493 R Axis:   264 Text Interpretation: Atrial fibrillation Nonspecific IVCD with LAD Inferior infarct, old Anterior infarct, old Lateral leads are also involved Ratefaster than prior Confirmed by Nanda Quinton [phone]) on 06/24/2020 12:52:18 PM       ____________________________________________  RADIOLOGY  CT ABDOMEN PELVIS WO CONTRAST  Result Date: 06/24/2020 CLINICAL DATA:  Chronic right abdominal pain.  Hypertension today. EXAM: CT ABDOMEN AND PELVIS WITHOUT CONTRAST TECHNIQUE:  Multidetector CT imaging of the abdomen and pelvis was performed following the standard protocol without IV contrast. COMPARISON:  CT abdomen and pelvis 10/05/2018. FINDINGS: Lower chest: Mild dependent atelectasis is more notable on the left. Trace bilateral pleural effusions. Cardiomegaly. No pericardial effusion. Hepatobiliary: No focal liver abnormality is seen. No gallstones, gallbladder wall thickening, or biliary dilatation. Pancreas: Unremarkable. No pancreatic ductal dilatation or surrounding inflammatory changes. Spleen: Normal in size without focal abnormality. Adrenals/Urinary Tract: Adrenal glands are unremarkable. Kidneys are normal, without renal calculi, focal lesion, or hydronephrosis. Bladder is unremarkable. Stomach/Bowel: Small hiatal hernia noted. Stomach is otherwise within normal limits. Status post appendectomy. No evidence of bowel wall thickening, distention, or inflammatory changes. A few scattered diverticula are seen. Vascular/Lymphatic: Aortic atherosclerosis. No enlarged abdominal or pelvic lymph nodes. Reproductive: Uterus and bilateral adnexa are unremarkable. Other: Small fat containing supraumbilical hernia. Musculoskeletal: No acute or focal abnormality. IMPRESSION: No acute abnormality abdomen or pelvis. Trace bilateral pleural effusions and mild basilar atelectasis. Cardiomegaly. Mild diverticulosis without diverticulitis. Small hiatal hernia and fat containing supraumbilical hernia. Aortic Atherosclerosis (ICD10-I70.0). Electronically Signed   By: Inge Rise M.D.   On: 06/24/2020 14:01   DG Chest Portable 1 View  Result Date: 06/24/2020 CLINICAL DATA:  Chest pain and hypertension. EXAM: PORTABLE CHEST 1 VIEW COMPARISON:  Chest x-ray dated October 21, 2017. FINDINGS: Unchanged cardiomegaly. New diffuse interstitial opacity. No focal consolidation, pleural effusion, or pneumothorax. No acute osseous abnormality. IMPRESSION: 1. New interstitial pulmonary edema.  Electronically Signed   By: Titus Dubin M.D.   On: 06/24/2020 12:59    ____________________________________________   PROCEDURES  Procedure(s) performed:   Procedures  CRITICAL CARE Performed by: Margette Fast Total critical care time: 35 minutes Critical care time was exclusive of separately billable procedures and treating other patients. Critical care was necessary to treat or prevent imminent or life-threatening deterioration. Critical care was time spent personally by me on the following activities: development of treatment plan with patient and/or surrogate as well as nursing, discussions with consultants, evaluation of patient's response to treatment, examination of patient, obtaining history from patient or surrogate, ordering and performing treatments and interventions, ordering and review of laboratory studies, ordering and review of radiographic studies, pulse oximetry and re-evaluation of patient's condition.  Nanda Quinton, MD Emergency Medicine  ____________________________________________   INITIAL IMPRESSION / ASSESSMENT AND PLAN / ED COURSE  Pertinent labs & imaging results that were available during my care of the patient were reviewed by me and considered in my medical decision making (see chart for details).   Patient presents emergency department with chest pain earlier this morning that is resolved along with some lightheadedness and elevated blood pressures.  No active pain symptoms.  She is on 2  L nasal cannula oxygen with borderline low sats on arrival.   Chest x-ray shows some new pulmonary edema.  Lab work is roughly within baseline.  I have added a BNP.  Feel that PE is much less likely in the setting with edema on exam.  COVID swab is also pending.  Patient up and ambulatory to the bathroom but desatted into the mid 80s.  Also desaturating into the mid 80s at rest when oxygen is turned off.  Plan for IV Lasix and admit to the hospitalist service.    Discussed patient's case with [facility] to request admission. Patient and family (if present) updated with plan. Care transferred to [facility] service.  I reviewed all nursing notes, vitals, pertinent old records, EKGs, labs, imaging (as available).  ____________________________________________  FINAL CLINICAL IMPRESSION(S) / ED DIAGNOSES  Final diagnoses:  Acute respiratory failure with hypoxia (HCC)  Acute pulmonary edema (HCC)    MEDICATIONS GIVEN DURING THIS VISIT:  Medications  furosemide (LASIX) injection 20 mg (20 mg Intravenous Given 06/24/20 1459)    Note:  This document was prepared using Dragon voice recognition software and may include unintentional dictation errors.  Nanda Quinton, MD, [facility] Emergency Medicine    Viraat Vanpatten, [facility], MD 06/24/20 [phone]

## 2020-06-24 NOTE — ED Notes (Signed)
Pt O2 sats dropped to 84% while on RA while ambulating. Pt assisted back to bed and place back on 2L [place]

## 2020-06-25 ENCOUNTER — Observation Stay (HOSPITAL_COMMUNITY): Payer: Medicare Other

## 2020-06-25 DIAGNOSIS — J9601 Acute respiratory failure with hypoxia: Secondary | ICD-10-CM | POA: Diagnosis not present

## 2020-06-25 DIAGNOSIS — I2510 Atherosclerotic heart disease of native coronary artery without angina pectoris: Secondary | ICD-10-CM | POA: Diagnosis present

## 2020-06-25 DIAGNOSIS — I5033 Acute on chronic diastolic (congestive) heart failure: Secondary | ICD-10-CM | POA: Diagnosis not present

## 2020-06-25 DIAGNOSIS — Z20822 Contact with and (suspected) exposure to covid-19: Secondary | ICD-10-CM | POA: Diagnosis present

## 2020-06-25 DIAGNOSIS — Z7901 Long term (current) use of anticoagulants: Secondary | ICD-10-CM | POA: Diagnosis not present

## 2020-06-25 DIAGNOSIS — I4819 Other persistent atrial fibrillation: Secondary | ICD-10-CM | POA: Diagnosis present

## 2020-06-25 DIAGNOSIS — Z7952 Long term (current) use of systemic steroids: Secondary | ICD-10-CM | POA: Diagnosis not present

## 2020-06-25 DIAGNOSIS — Z79899 Other long term (current) drug therapy: Secondary | ICD-10-CM | POA: Diagnosis not present

## 2020-06-25 DIAGNOSIS — N1831 Chronic kidney disease, stage 3a: Secondary | ICD-10-CM

## 2020-06-25 DIAGNOSIS — J449 Chronic obstructive pulmonary disease, unspecified: Secondary | ICD-10-CM | POA: Diagnosis present

## 2020-06-25 DIAGNOSIS — Z794 Long term (current) use of insulin: Secondary | ICD-10-CM | POA: Diagnosis not present

## 2020-06-25 DIAGNOSIS — I482 Chronic atrial fibrillation, unspecified: Secondary | ICD-10-CM | POA: Diagnosis not present

## 2020-06-25 DIAGNOSIS — J810 Acute pulmonary edema: Secondary | ICD-10-CM | POA: Diagnosis present

## 2020-06-25 DIAGNOSIS — I130 Hypertensive heart and chronic kidney disease with heart failure and stage 1 through stage 4 chronic kidney disease, or unspecified chronic kidney disease: Secondary | ICD-10-CM | POA: Diagnosis present

## 2020-06-25 DIAGNOSIS — Z886 Allergy status to analgesic agent status: Secondary | ICD-10-CM | POA: Diagnosis not present

## 2020-06-25 DIAGNOSIS — Z7951 Long term (current) use of inhaled steroids: Secondary | ICD-10-CM | POA: Diagnosis not present

## 2020-06-25 DIAGNOSIS — Z888 Allergy status to other drugs, medicaments and biological substances status: Secondary | ICD-10-CM | POA: Diagnosis not present

## 2020-06-25 DIAGNOSIS — Z87891 Personal history of nicotine dependence: Secondary | ICD-10-CM | POA: Diagnosis not present

## 2020-06-25 DIAGNOSIS — E1122 Type 2 diabetes mellitus with diabetic chronic kidney disease: Secondary | ICD-10-CM | POA: Diagnosis present

## 2020-06-25 DIAGNOSIS — Z833 Family history of diabetes mellitus: Secondary | ICD-10-CM | POA: Diagnosis not present

## 2020-06-25 DIAGNOSIS — Z808 Family history of malignant neoplasm of other organs or systems: Secondary | ICD-10-CM | POA: Diagnosis not present

## 2020-06-25 DIAGNOSIS — N1832 Chronic kidney disease, stage 3b: Secondary | ICD-10-CM | POA: Diagnosis present

## 2020-06-25 LAB — GLUCOSE, CAPILLARY
Glucose-Capillary: 172 mg/dL — ABNORMAL HIGH (ref 70–99)
Glucose-Capillary: 235 mg/dL — ABNORMAL HIGH (ref 70–99)
Glucose-Capillary: 200 mg/dL — ABNORMAL HIGH (ref 70–99)
Glucose-Capillary: 176 mg/dL — ABNORMAL HIGH (ref 70–99)

## 2020-06-25 LAB — CBC
HCT: 35 % — ABNORMAL LOW (ref 36.0–46.0)
Hemoglobin: 10.6 g/dL — ABNORMAL LOW (ref 12.0–15.0)
MCH: 27.7 pg (ref 26.0–34.0)
MCHC: 30.3 g/dL (ref 30.0–36.0)
MCV: 91.6 fL (ref 80.0–100.0)
Platelets: 177 10*3/uL (ref 150–400)
RBC: 3.82 MIL/uL — ABNORMAL LOW (ref 3.87–5.11)
RDW: 15.1 % (ref 11.5–15.5)
WBC: 6.2 10*3/uL (ref 4.0–10.5)
nRBC: 0 % (ref 0.0–0.2)

## 2020-06-25 LAB — BASIC METABOLIC PANEL
Anion gap: 7 (ref 5–15)
BUN: 26 mg/dL — ABNORMAL HIGH (ref 8–23)
CO2: 32 mmol/L (ref 22–32)
Calcium: 8.6 mg/dL — ABNORMAL LOW (ref 8.9–10.3)
Chloride: 96 mmol/L — ABNORMAL LOW (ref 98–111)
Creatinine, Ser: 1.62 mg/dL — ABNORMAL HIGH (ref 0.44–1.00)
GFR, Estimated: 32 mL/min — ABNORMAL LOW (ref >60)
Glucose, Bld: 192 mg/dL — ABNORMAL HIGH (ref 70–99)
Potassium: 3.9 mmol/L (ref 3.5–5.1)
Sodium: 135 mmol/L (ref 135–145)

## 2020-06-25 LAB — ECHOCARDIOGRAM COMPLETE
Area-P 1/2: 2.59 cm2
Height: 63 in
S' Lateral: 3.27 cm
Weight: 2758.4 oz

## 2020-06-25 NOTE — Evaluation (Addendum)
Physical Therapy Evaluation Patient Details Name: Laura Best MRN: [id number] DOB: 16-Mar-1940 Today's Date: 06/25/2020   History of Present Illness  Laura Best is a 80 y.o. female with medical history significant for hypertension, diabetes mellitus, CKD 3, pulmonary hypertension, COPD, diastolic CHF, atrial fibrillation.  Patient presented to the ED with complaints of an episode of lightheadedness and chest pain that started earlier today while she was resting.  Blood pressure was also elevated at 170/82.  So she called her primary care provider.  She was directed to the ED as he had no slots available to see her.  On EMS arrival, patient's blood pressure was 182/106 and 160/70.  Chest pain was left lower chest/upper abdomen, lasted a few seconds and spontaneously resolved.  Dizziness was present only when standing.  Improved when lying down.  She did not have any difficulty breathing until she presented to the ED.  She reports compliance with her medications which include Xarelto and Lasix 60 mg daily.  She is not on home O2.  She tells me her normal weight is 169 pounds    Clinical Impression  Patient functioning neart baseline for functional mobility and gait demonstrating good return for ambulation in room/hallways without loss of balance, on room air with SpO2 at 90-95% and tolerated sitting up at bedside after therapy on room air - nursing staff notified.  Patient discharged to care of nursing for ambulation daily as tolerated for length of stay.     Follow Up Recommendations No PT follow up    Equipment Recommendations  None recommended by PT    Recommendations for Other Services       Precautions / Restrictions Precautions Precautions: Fall Restrictions Weight Bearing Restrictions: No      Mobility  Bed Mobility Overal bed mobility: Modified Independent               Patient Response: Cooperative  Transfers Overall transfer level: Modified independent Equipment  used: Rolling walker (2 wheeled)                Ambulation/Gait Ambulation/Gait assistance: Modified independent (Device/Increase time) Gait Distance (Feet): 100 Feet Assistive device: Rolling walker (2 wheeled) Gait Pattern/deviations: WFL(Within Functional Limits) Gait velocity: decreased      Stairs            Wheelchair Mobility    Modified Rankin (Stroke Patients Only)       Balance Overall balance assessment: Modified Independent                                           Pertinent Vitals/Pain Pain Assessment: No/denies pain    Home Living Family/patient expects to be discharged to:: Private residence Living Arrangements: Children Available Help at Discharge: Available 24 hours/day;Available PRN/intermittently;Family Type of Home: Mobile home Home Access: Stairs to enter Entrance Stairs-Rails: Can reach both;Left;Right Entrance Stairs-Number of Steps: 1 Home Layout: One level Home Equipment: Walker - 2 wheels;Toilet riser;Shower seat;Bedside commode Additional Comments: Does not use her RW unless she absolutely needs it    Prior Function Level of Independence: Independent with assistive device(s)               Hand Dominance   Dominant Hand: Right    Extremity/Trunk Assessment   Upper Extremity Assessment Upper Extremity Assessment: Overall WFL for tasks assessed    Lower Extremity Assessment Lower Extremity Assessment: Overall  WFL for tasks assessed    Cervical / Trunk Assessment Cervical / Trunk Assessment: Kyphotic  Communication   Communication: No difficulties  Cognition Arousal/Alertness: Awake/alert Behavior During Therapy: WFL for tasks assessed/performed Overall Cognitive Status: Within Functional Limits for tasks assessed                                        General Comments      Exercises     Assessment/Plan    PT Assessment Patent does not need any further PT services  PT  Problem List         PT Treatment Interventions      PT Goals (Current goals can be found in the Care Plan section)  Acute Rehab PT Goals Patient Stated Goal: Return home PT Goal Formulation: With patient Time For Goal Achievement: 06/25/20 Potential to Achieve Goals: Good    Frequency     Barriers to discharge        Co-evaluation               AM-PAC PT "6 Clicks" Mobility  Outcome Measure Help needed turning from your back to your side while in a flat bed without using bedrails?: None Help needed moving from lying on your back to sitting on the side of a flat bed without using bedrails?: None Help needed moving to and from a bed to a chair (including a wheelchair)?: None Help needed standing up from a chair using your arms (e.g., wheelchair or bedside chair)?: None Help needed to walk in hospital room?: None Help needed climbing 3-5 steps with a railing? : A Little 6 Click Score: 23    End of Session   Activity Tolerance: Patient tolerated treatment well Patient left: in bed;with call bell/phone within reach Nurse Communication: Mobility status PT Visit Diagnosis: Unsteadiness on feet (R26.81);Other abnormalities of gait and mobility (R26.89);Muscle weakness (generalized) (M62.81)    Time: [id number] PT Time Calculation (min) (ACUTE ONLY): 20 min   Charges:   PT Evaluation $PT Eval Moderate Complexity: 1 Mod PT Treatments $Therapeutic Activity: 8-22 mins        3:40 PM, 06/25/20 Laura Best SPT  3:40 PM, 06/25/20 Laura Best, MPT Physical Therapist with [facility] [phone] office [phone] mobile phone

## 2020-06-25 NOTE — Progress Notes (Signed)
PROGRESS NOTE    Laura Best  [id number] DOB: [birth year] DOA: 06/24/2020 PCP: Susy Frizzle, MD    Brief Narrative:  80 year old female with a history of atrial fibrillation, diastolic heart failure, COPD, admitted to the hospital with dizziness shortness of breath and chest pain.  She was noted to be hypoxic on room air on ambulation.  Patient was found to have decompensated CHF and was admitted for intravenous diuresis   Assessment & Plan:   Principal Problem:   Acute respiratory failure with hypoxia (HCC) Active Problems:   Type 2 diabetes with nephropathy (HCC)   CKD (chronic kidney disease) stage 3, GFR 30-59 ml/min (HCC)   Diastolic CHF, acute on chronic (HCC)   Atrial fibrillation, chronic   Acute respiratory failure with hypoxia -Patient noted to have oxygen saturation dropping down to 84% on room air with ambulation -Currently on 2 L of oxygen -Try and wean off as tolerated  Acute on chronic diastolic congestive heart failure -Appears to be having good response to intravenous Lasix -Still has some evidence of volume overload and needs continued diuresis -Continue to monitor intake and output -Echocardiogram has been ordered -Continue daily weights  Atrial fibrillation, chronic -Heart rate is currently stable -Continue rate control with Coreg -She is anticoagulated with Xarelto  Diabetes -A1c 7.3 -Blood sugars currently stable -Continue basal insulin with sliding scale  CKD stage IIIb -Currently creatinine 1.35 -Continue to monitor renal function with diuresis  Hypertension -Continued on Norvasc, clonidine and Coreg -Blood pressure currently stable  COPD -No wheezing at this time -Continue bronchodilators   DVT prophylaxis:  Rivaroxaban (XARELTO) tablet 15 mg  Code Status: Full code Family Communication: Discussed with patient Disposition Plan: Status is: Inpatient  The patient will require care spanning > 2 midnights and should be moved  to inpatient because: IV treatments appropriate due to intensity of illness or inability to take PO  Dispo: The patient is from: Home              Anticipated d/c is to: Home              Patient currently is not medically stable to d/c.   Difficult to place patient No         Consultants:     Procedures:     Antimicrobials:       Subjective: Feels her breathing is better since she came to the hospital, but not quite back to baseline.  No chest pain.  Objective: Vitals:   06/25/20 0342 06/25/20 0731 06/25/20 0749 06/25/20 1317  BP: (!) 115/50 135/75  117/65  Pulse: 71 67  64  Resp: '19 20  18  '$ Temp: 98.6 F (37 C) 98.1 F (36.7 C)  98.5 F (36.9 C)  TempSrc: Oral Oral  Oral  SpO2: 97% 100% 96% 100%  Weight:      Height:        Intake/Output Summary (Last 24 hours) at 06/25/2020 1425 Last data filed at 06/25/2020 1317 Gross per 24 hour  Intake 1196 ml  Output 1800 ml  Net -604 ml   Filed Weights   06/24/20 1229 06/24/20 1700  Weight: 77.1 kg 78.2 kg    Examination:  General exam: Appears calm and comfortable  Respiratory system: Crackles at bases. Respiratory effort normal. Cardiovascular system: S1 & S2 heard, RRR. No JVD, murmurs, rubs, gallops or clicks. No pedal edema. Gastrointestinal system: Abdomen is nondistended, soft and nontender. No organomegaly or masses felt. Normal bowel sounds heard.  Central nervous system: Alert and oriented. No focal neurological deficits. Extremities: Symmetric 5 x 5 power. Skin: No rashes, lesions or ulcers Psychiatry: Judgement and insight appear normal. Mood & affect appropriate.     Data Reviewed: I have personally reviewed following labs and imaging studies  CBC: Recent Labs  Lab 06/24/20 1257 06/25/20 0645  WBC 8.4 6.2  HGB 12.8 10.6*  HCT 40.1 35.0*  MCV 90.3 91.6  PLT 204 [id number]   Basic Metabolic Panel: Recent Labs  Lab 06/24/20 1257  NA 139  K 3.9  CL 104  CO2 26  GLUCOSE 161*  BUN 24*   CREATININE 1.34*  CALCIUM 9.0   GFR: Estimated Creatinine Clearance: 33.7 mL/min (A) (by C-G formula based on SCr of 1.34 mg/dL (H)). Liver Function Tests: Recent Labs  Lab 06/24/20 1257  AST 16  ALT 13  ALKPHOS 102  BILITOT 0.6  PROT 7.1  ALBUMIN 3.7   Recent Labs  Lab 06/24/20 1257  LIPASE 21   No results for input(s): AMMONIA in the last 168 hours. Coagulation Profile: No results for input(s): INR, PROTIME in the last 168 hours. Cardiac Enzymes: No results for input(s): CKTOTAL, CKMB, CKMBINDEX, TROPONINI in the last 168 hours. BNP (last 3 results) No results for input(s): PROBNP in the last 8760 hours. HbA1C: No results for input(s): HGBA1C in the last 72 hours. CBG: Recent Labs  Lab 06/24/20 1707 06/24/20 2213 06/25/20 0801  GLUCAP 115* 179* 172*   Lipid Profile: No results for input(s): CHOL, HDL, LDLCALC, TRIG, CHOLHDL, LDLDIRECT in the last 72 hours. Thyroid Function Tests: No results for input(s): TSH, T4TOTAL, FREET4, T3FREE, THYROIDAB in the last 72 hours. Anemia Panel: No results for input(s): VITAMINB12, FOLATE, FERRITIN, TIBC, IRON, RETICCTPCT in the last 72 hours. Sepsis Labs: No results for input(s): PROCALCITON, LATICACIDVEN in the last 168 hours.  Recent Results (from the past 240 hour(s))  Resp Panel by RT-PCR (Flu A&B, Covid) Nasopharyngeal Swab     Status: None   Collection Time: 06/24/20  2:25 PM   Specimen: Nasopharyngeal Swab; Nasopharyngeal(NP) swabs in vial transport medium  Result Value Ref Range Status   SARS Coronavirus 2 by RT PCR NEGATIVE NEGATIVE Final    Comment: (NOTE) SARS-CoV-2 target nucleic acids are NOT DETECTED.  The SARS-CoV-2 RNA is generally detectable in upper respiratory specimens during the acute phase of infection. The lowest concentration of SARS-CoV-2 viral copies this assay can detect is 138 copies/mL. A negative result does not preclude SARS-Cov-2 infection and should not be used as the sole basis for  treatment or other patient management decisions. A negative result may occur with  improper specimen collection/handling, submission of specimen other than nasopharyngeal swab, presence of viral mutation(s) within the areas targeted by this assay, and inadequate number of viral copies(<138 copies/mL). A negative result must be combined with clinical observations, patient history, and epidemiological information. The expected result is Negative.  Fact Sheet for Patients:  EntrepreneurPulse.com.au  Fact Sheet for Healthcare Providers:  IncredibleEmployment.be  This test is no t yet approved or cleared by the [place] FDA and  has been authorized for detection and/or diagnosis of SARS-CoV-2 by FDA under an Emergency Use Authorization (EUA). This EUA will remain  in effect (meaning this test can be used) for the duration of the COVID-19 declaration under Section 564(b)(1) of the Act, 21 U.S.C.section 360bbb-3(b)(1), unless the authorization is terminated  or revoked sooner.       Influenza A by PCR NEGATIVE NEGATIVE Final  Influenza B by PCR NEGATIVE NEGATIVE Final    Comment: (NOTE) The Xpert Xpress SARS-CoV-2/FLU/RSV plus assay is intended as an aid in the diagnosis of influenza from Nasopharyngeal swab specimens and should not be used as a sole basis for treatment. Nasal washings and aspirates are unacceptable for Xpert Xpress SARS-CoV-2/FLU/RSV testing.  Fact Sheet for Patients: EntrepreneurPulse.com.au  Fact Sheet for Healthcare Providers: IncredibleEmployment.be  This test is not yet approved or cleared by the [place] FDA and has been authorized for detection and/or diagnosis of SARS-CoV-2 by FDA under an Emergency Use Authorization (EUA). This EUA will remain in effect (meaning this test can be used) for the duration of the COVID-19 declaration under Section 564(b)(1) of the Act, 21  U.S.C. section 360bbb-3(b)(1), unless the authorization is terminated or revoked.  Performed at [facility], [address], [place], [place] [address]          Radiology Studies: CT ABDOMEN PELVIS WO CONTRAST  Result Date: 06/24/2020 CLINICAL DATA:  Chronic right abdominal pain.  Hypertension today. EXAM: CT ABDOMEN AND PELVIS WITHOUT CONTRAST TECHNIQUE: Multidetector CT imaging of the abdomen and pelvis was performed following the standard protocol without IV contrast. COMPARISON:  CT abdomen and pelvis 10/05/2018. FINDINGS: Lower chest: Mild dependent atelectasis is more notable on the left. Trace bilateral pleural effusions. Cardiomegaly. No pericardial effusion. Hepatobiliary: No focal liver abnormality is seen. No gallstones, gallbladder wall thickening, or biliary dilatation. Pancreas: Unremarkable. No pancreatic ductal dilatation or surrounding inflammatory changes. Spleen: Normal in size without focal abnormality. Adrenals/Urinary Tract: Adrenal glands are unremarkable. Kidneys are normal, without renal calculi, focal lesion, or hydronephrosis. Bladder is unremarkable. Stomach/Bowel: Small hiatal hernia noted. Stomach is otherwise within normal limits. Status post appendectomy. No evidence of bowel wall thickening, distention, or inflammatory changes. A few scattered diverticula are seen. Vascular/Lymphatic: Aortic atherosclerosis. No enlarged abdominal or pelvic lymph nodes. Reproductive: Uterus and bilateral adnexa are unremarkable. Other: Small fat containing supraumbilical hernia. Musculoskeletal: No acute or focal abnormality. IMPRESSION: No acute abnormality abdomen or pelvis. Trace bilateral pleural effusions and mild basilar atelectasis. Cardiomegaly. Mild diverticulosis without diverticulitis. Small hiatal hernia and fat containing supraumbilical hernia. Aortic Atherosclerosis (ICD10-I70.0). Electronically Signed   By: Inge Rise M.D.   On: 06/24/2020 14:01   DG Chest Portable  1 View  Result Date: 06/24/2020 CLINICAL DATA:  Chest pain and hypertension. EXAM: PORTABLE CHEST 1 VIEW COMPARISON:  Chest x-ray dated October 21, 2017. FINDINGS: Unchanged cardiomegaly. New diffuse interstitial opacity. No focal consolidation, pleural effusion, or pneumothorax. No acute osseous abnormality. IMPRESSION: 1. New interstitial pulmonary edema. Electronically Signed   By: Titus Dubin M.D.   On: 06/24/2020 12:59        Scheduled Meds: . amLODipine  5 mg Oral Daily  . carvedilol  12.5 mg Oral BID  . cloNIDine  0.3 mg Oral BID  . febuxostat  40 mg Oral Daily  . fluticasone  2 spray Each Nare Daily  . fluticasone furoate-vilanterol  1 puff Inhalation Daily   And  . umeclidinium bromide  1 puff Inhalation Daily  . furosemide  40 mg Intravenous Q12H  . gabapentin  100-200 mg Oral QHS  . insulin aspart  0-5 Units Subcutaneous QHS  . insulin aspart  0-9 Units Subcutaneous TID WC  . insulin detemir  10 Units Subcutaneous QHS  . Rivaroxaban  15 mg Oral Daily   Continuous Infusions:   LOS: 0 days    Time spent: 13mns    JKathie Dike MD [facility]  If 7PM-7AM, please contact night-coverage [email or web address]  06/25/2020, 2:25 PM

## 2020-06-25 NOTE — TOC Initial Note (Signed)
Transition of Care [facility]) - Initial/Assessment Note    Patient Details  Name: Laura Best MRN: [id number] Date of Birth: 1940/08/30  Transition of Care [facility]) CM/SW Contact:    Salome Arnt, [place] Phone Number: 06/25/2020, 8:44 AM  Clinical Narrative:  Pt admitted due to acute on chronic diastolic CHF. TOC consulted for CHF screening. Pt reports she lives with her son, daughter-in-law, and 2 grandchildren. She indicates she is independent with ADLs and plans to return home when medically stable. CHF screening completed with pt. Pt states she was diagnosed with CHF awhile ago. She said she weighs herself daily and takes medications as prescribed. Per pt, her son and daughter-in-law do most of the cooking at home and they follow a heart healthy diet. She is followed by Dr. Harl Bowie. No needs reported by pt at this time. TOC will continue to follow.                   Expected Discharge Plan: Home/Self Care Barriers to Discharge: Continued Medical Work up   Patient Goals and CMS Choice Patient states their goals for this hospitalization and ongoing recovery are:: return home   Choice offered to / list presented to : Patient  Expected Discharge Plan and Services Expected Discharge Plan: Home/Self Care In-house Referral: Clinical Social Work     Living arrangements for the past 2 months: Single Family Home                 DME Arranged: N/A DME Agency: NA                  Prior Living Arrangements/Services Living arrangements for the past 2 months: Single Family Home Lives with:: Adult Children Patient language and need for interpreter reviewed:: Yes Do you feel safe going back to the place where you live?: Yes      Need for Family Participation in Patient Care: No (Comment)   Current home services: DME (shower chair, walker) Criminal Activity/Legal Involvement Pertinent to Current Situation/Hospitalization: No - Comment as needed  Activities of Daily Living Home  Assistive Devices/Equipment: Walker (specify type),Shower chair with back ADL Screening (condition at time of admission) Patient's cognitive ability adequate to safely complete daily activities?: Yes Is the patient deaf or have difficulty hearing?: No Does the patient have difficulty seeing, even when wearing glasses/contacts?: No Does the patient have difficulty concentrating, remembering, or making decisions?: No Patient able to express need for assistance with ADLs?: Yes Does the patient have difficulty dressing or bathing?: No Independently performs ADLs?: Yes (appropriate for developmental age) Does the patient have difficulty walking or climbing stairs?: Yes Weakness of Legs: None Weakness of Arms/Hands: None  Permission Sought/Granted                  Emotional Assessment   Attitude/Demeanor/Rapport: Engaged Affect (typically observed): Accepting Orientation: : Oriented to Self,Oriented to Place,Oriented to  Time,Oriented to Situation Alcohol / Substance Use: Not Applicable Psych Involvement: No (comment)  Admission diagnosis:  Acute pulmonary edema ([place]) [J81.0] Acute respiratory failure with hypoxia ([place]) [J96.01] Patient Active Problem List   Diagnosis Date Noted  . Atrial fibrillation ([place])   . Acute respiratory failure with hypoxia ([place]) 10/22/2017  . Uncontrolled type 2 diabetes mellitus with hyperglycemia ([place]) 10/22/2017  . Atrial fibrillation, chronic 10/22/2017  . Acute on chronic respiratory failure ([place]) 10/22/2017  . Persistent atrial fibrillation ([place])   . Acute on chronic respiratory failure with hypoxia ([place]) 01/13/2017  . Diabetic  peripheral neuropathy associated with type 2 diabetes mellitus ([place]) 05/11/2016  . PAH (pulmonary artery hypertension) ([place])   . History of atrial septal defect repair   . Leukocytosis 04/06/2016  . Diastolic CHF, acute on chronic (HCC) 04/05/2016  . SBO (small bowel obstruction) ([place]) 02/17/2016  . Chronic diastolic  congestive heart failure ([place]) 02/17/2016  . Diabetes mellitus with complication ([place]) [id number]  . Pyuria 02/17/2016  . GERD (gastroesophageal reflux disease) 04/05/2015  . Acute diverticulitis 05/03/2014  . Bradycardia 04/02/2013  . Hyperkalemia 01/22/2013  . Anemia of chronic disease 01/21/2013  . Acute bronchitis 01/20/2013  . CKD (chronic kidney disease) stage 3, GFR 30-59 ml/min (HCC) 12/25/2012  . Screening for colorectal cancer 12/25/2012  . Breast cancer screening 12/25/2012  . Vitamin D deficiency   . Osteoporosis   . Postop check 08/13/2012  . Appendiceal abscess 07/30/2012  . Fever, unspecified 07/19/2012  . Abdominal pain 07/19/2012  . SOB (shortness of breath) 07/19/2012  . ARF (acute renal failure) ([place]) 07/19/2012  . Abscess, appendix 07/19/2012  . Type 2 diabetes with nephropathy ([place]) 02/02/2011  . Pulmonary hypertension ([place]) 02/02/2011  . COPD exacerbation ([place]) 02/02/2011  . Hypokalemia 02/02/2011  . Normocytic anemia 02/02/2011  . Essential hypertension   . Hyperlipidemia    PCP:  Susy Frizzle, MD Pharmacy:   [facility] [address], [place] - [place] [address] [place] [address] [place] [address] Phone: [phone] Fax: [phone]  [facility]  ([place], [address], [address] [place], [place] [address] Phone: [phone] Fax: [place], [address], [address] [place], [place] [address] Phone: [phone] Fax: [phone]     Social Determinants of Health (SDOH) Interventions    Readmission Risk Interventions No flowsheet data found.

## 2020-06-25 NOTE — Progress Notes (Signed)
*  PRELIMINARY RESULTS* Echocardiogram 2D Echocardiogram has been performed.  Leavy Cella 06/25/2020, 3:05 PM

## 2020-06-26 DIAGNOSIS — I482 Chronic atrial fibrillation, unspecified: Secondary | ICD-10-CM | POA: Diagnosis not present

## 2020-06-26 DIAGNOSIS — J9601 Acute respiratory failure with hypoxia: Secondary | ICD-10-CM | POA: Diagnosis not present

## 2020-06-26 DIAGNOSIS — I5033 Acute on chronic diastolic (congestive) heart failure: Secondary | ICD-10-CM | POA: Diagnosis not present

## 2020-06-26 DIAGNOSIS — N1831 Chronic kidney disease, stage 3a: Secondary | ICD-10-CM | POA: Diagnosis not present

## 2020-06-26 LAB — GLUCOSE, CAPILLARY
Glucose-Capillary: 148 mg/dL — ABNORMAL HIGH (ref 70–99)
Glucose-Capillary: 242 mg/dL — ABNORMAL HIGH (ref 70–99)

## 2020-06-26 LAB — BASIC METABOLIC PANEL
Anion gap: 6 (ref 5–15)
BUN: 33 mg/dL — ABNORMAL HIGH (ref 8–23)
CO2: 32 mmol/L (ref 22–32)
Calcium: 8.6 mg/dL — ABNORMAL LOW (ref 8.9–10.3)
Chloride: 98 mmol/L (ref 98–111)
Creatinine, Ser: 1.73 mg/dL — ABNORMAL HIGH (ref 0.44–1.00)
GFR, Estimated: 30 mL/min — ABNORMAL LOW (ref >60)
Glucose, Bld: 168 mg/dL — ABNORMAL HIGH (ref 70–99)
Potassium: 4.1 mmol/L (ref 3.5–5.1)
Sodium: 136 mmol/L (ref 135–145)

## 2020-06-26 NOTE — Progress Notes (Signed)
Walked patient 5 times around the nurses' station loop. Patient did not become SOB while ambulating on RA.

## 2020-06-26 NOTE — Discharge Summary (Signed)
Physician Discharge Summary  Laura Best EHM:[id number] DOB: [birth year] DOA: 06/24/2020  PCP: Susy Frizzle, MD  Admit date: 06/24/2020 Discharge date: 06/26/2020  Admitted From: Home Disposition: Home  Recommendations for Outpatient Follow-up:  1. Follow up with PCP in 1-2 weeks 2. Please obtain BMP/CBC in one week 3. Patient will be scheduled for cardiology follow-up  Discharge Condition: Stable CODE STATUS: Full code Diet recommendation: Heart healthy, carb modified  Brief/Interim Summary: 80 year old female with a history of atrial fibrillation, diastolic heart failure, COPD, admitted to the hospital with dizziness shortness of breath and chest pain.  She was noted to be hypoxic on room air on ambulation.  Patient was found to have decompensated CHF and was admitted for intravenous diuresis  Discharge Diagnoses:  Principal Problem:   Acute respiratory failure with hypoxia (HCC) Active Problems:   Type 2 diabetes with nephropathy (HCC)   CKD (chronic kidney disease) stage 3, GFR 30-59 ml/min (HCC)   Diastolic CHF, acute on chronic (HCC)   Atrial fibrillation, chronic   Acute on chronic diastolic (congestive) heart failure (HCC)  Acute respiratory failure with hypoxia -Patient noted to have oxygen saturation dropping down to 84% on room air with ambulation -Patient was weaned off of oxygen and is currently on room air -She was able to ambulate without difficulty and did not have any hypoxia  Acute on chronic diastolic congestive heart failure -Patient was treated with intravenous Lasix and had good urine output -Currently, appears to be euvolemic -Echocardiogram shows preserved ejection fraction -She reported that she was previously taking Lasix 60 mg daily, but recently was changed to 40 mg daily.  We will change home dose of Lasix back to 60 mg daily and have her follow-up with cardiology   Atrial fibrillation, chronic -Heart rate is currently stable -Continue rate  control with Coreg -She is anticoagulated with Xarelto  Diabetes -A1c 7.3 -Blood sugars currently stable -Continue basal insulin with sliding scale  CKD stage IIIb -Baseline creatinine around 1.3 -Creatinine had trended up to 1.7 with diuresis -She will need repeat basic metabolic panel in 1 week to ensure stability of renal function  Hypertension -Continued on Norvasc, clonidine and Coreg -Blood pressure currently stable  COPD -No wheezing at this time -Continue bronchodilators   Discharge Instructions  Discharge Instructions    Diet - low sodium heart healthy   Complete by: As directed    Increase activity slowly   Complete by: As directed      Allergies as of 06/26/2020      Reactions   Ace Inhibitors Other (See Comments)   Hyperkalemia   Actos [pioglitazone] Swelling   Aspirin Other (See Comments)   G.I. Upset. In high doses   Metformin And Related Diarrhea   Eliquis [apixaban] Rash, Other (See Comments)   Patient states that this med was d/ced by prescriber due to interference with allergies and states that she also had a breakout      Medication List    STOP taking these medications   calcitRIOL 0.25 MCG capsule Commonly known as: ROCALTROL   ondansetron 4 MG disintegrating tablet Commonly known as: Zofran ODT   predniSONE 20 MG tablet Commonly known as: DELTASONE   psyllium 95 % Pack Commonly known as: HYDROCIL/METAMUCIL     TAKE these medications   Accu-Chek Guide Me w/Device Kit USE TO CHECK BLOOD SUGAR 4  TIMES DAILY BEFORE MEALS  AND AT BEDTIME   Accu-Chek Softclix Lancets lancets CHECK BLOOD SUGAR 3 TIMES  DAILY  BEFORE MEALS AND AT  BEDTIME   acetaminophen 500 MG tablet Commonly known as: TYLENOL Take 500 mg by mouth every 6 (six) hours as needed for pain.   amLODipine 10 MG tablet Commonly known as: NORVASC TAKE ONE-HALF TABLET BY  MOUTH DAILY   BLOOD GLUCOSE TEST STRIPS Strp Please dispense as Accu-Chek Guide Me. Use as  directed to monitor FSBS 3x daily. Dx: E11.9.   Accu-Chek Aviva Plus test strip Generic drug: glucose blood CHECK BLOOD SUGAR 3 TIMES  DAILY BEFORE MEALS AND AT  BEDTIME.   Accu-Chek Guide test strip Generic drug: glucose blood CHECK BLOOD SUGAR 3 TIMES  DAILY BEFORE MEALS AND AT  BEDTIME   carvedilol 12.5 MG tablet Commonly known as: COREG TAKE 1 TABLET BY MOUTH  TWICE DAILY   cloNIDine 0.3 MG tablet Commonly known as: CATAPRES TAKE 1 TABLET BY MOUTH  TWICE DAILY   dicyclomine 20 MG tablet Commonly known as: BENTYL Take 1 tablet (20 mg total) by mouth every 6 (six) hours as needed for spasms (intestinal spasms).   febuxostat 40 MG tablet Commonly known as: ULORIC TAKE 1 TABLET BY MOUTH  DAILY   fluticasone 50 MCG/ACT nasal spray Commonly known as: FLONASE Place 2 sprays into both nostrils daily.   furosemide 40 MG tablet Commonly known as: LASIX TAKE 1 AND 1/2 TABLETS BY  MOUTH DAILY   gabapentin 100 MG capsule Commonly known as: NEURONTIN TAKE 1 TO 2 CAPSULES BY  MOUTH AT BEDTIME FOR  PERIPHERAL NEUROPATHY What changed: See the new instructions.   guaiFENesin 600 MG 12 hr tablet Commonly known as: Mucinex Take 1 tablet (600 mg total) by mouth 2 (two) times daily.   HumaLOG KwikPen 100 UNIT/ML KwikPen Generic drug: insulin lispro INJECT SUBCUTANEOUSLY 7  UNITS 3 TIMES DAILY What changed: See the new instructions.   ipratropium-albuterol 0.5-2.5 (3) MG/3ML Soln Commonly known as: DUONEB Take 3 mLs by nebulization every 6 (six) hours as needed.   Levemir FlexTouch 100 UNIT/ML FlexPen Generic drug: insulin detemir Inject 20 Units into the skin at bedtime. SUBCUTANEOUSLY ONCE DAILY AT  10  PM   linaclotide 145 MCG Caps capsule Commonly known as: LINZESS Take 1 capsule (145 mcg total) by mouth daily before breakfast.   omeprazole 20 MG capsule Commonly known as: PRILOSEC TAKE 1 CAPSULE BY MOUTH  DAILY   Pen Needles 31G X 6 MM Misc Use as directed to inject  insulin SQ TID.   polyethylene glycol 17 g packet Commonly known as: MiraLax Take 17 g by mouth daily. Hold for diarrhea.   simvastatin 20 MG tablet Commonly known as: ZOCOR TAKE 1 TABLET BY MOUTH ONCE DAILY AT BEDTIME   Trelegy Ellipta 100-62.5-25 MCG/INH Aepb Generic drug: Fluticasone-Umeclidin-Vilant Inhale 1 Inhaler into the lungs daily.   Vitamin D 50 MCG (2000 UT) Caps Take 2,000 Units by mouth daily.   Xarelto 15 MG Tabs tablet Generic drug: Rivaroxaban TAKE 1 TABLET BY MOUTH  DAILY       Allergies  Allergen Reactions  . Ace Inhibitors Other (See Comments)    Hyperkalemia  . Actos [Pioglitazone] Swelling  . Aspirin Other (See Comments)    G.I. Upset. In high doses  . Metformin And Related Diarrhea  . Eliquis [Apixaban] Rash and Other (See Comments)    Patient states that this med was d/ced by prescriber due to interference with allergies and states that she also had a breakout    Consultations:     Procedures/Studies: CT ABDOMEN PELVIS  WO CONTRAST  Result Date: 06/24/2020 CLINICAL DATA:  Chronic right abdominal pain.  Hypertension today. EXAM: CT ABDOMEN AND PELVIS WITHOUT CONTRAST TECHNIQUE: Multidetector CT imaging of the abdomen and pelvis was performed following the standard protocol without IV contrast. COMPARISON:  CT abdomen and pelvis 10/05/2018. FINDINGS: Lower chest: Mild dependent atelectasis is more notable on the left. Trace bilateral pleural effusions. Cardiomegaly. No pericardial effusion. Hepatobiliary: No focal liver abnormality is seen. No gallstones, gallbladder wall thickening, or biliary dilatation. Pancreas: Unremarkable. No pancreatic ductal dilatation or surrounding inflammatory changes. Spleen: Normal in size without focal abnormality. Adrenals/Urinary Tract: Adrenal glands are unremarkable. Kidneys are normal, without renal calculi, focal lesion, or hydronephrosis. Bladder is unremarkable. Stomach/Bowel: Small hiatal hernia noted. Stomach is  otherwise within normal limits. Status post appendectomy. No evidence of bowel wall thickening, distention, or inflammatory changes. A few scattered diverticula are seen. Vascular/Lymphatic: Aortic atherosclerosis. No enlarged abdominal or pelvic lymph nodes. Reproductive: Uterus and bilateral adnexa are unremarkable. Other: Small fat containing supraumbilical hernia. Musculoskeletal: No acute or focal abnormality. IMPRESSION: No acute abnormality abdomen or pelvis. Trace bilateral pleural effusions and mild basilar atelectasis. Cardiomegaly. Mild diverticulosis without diverticulitis. Small hiatal hernia and fat containing supraumbilical hernia. Aortic Atherosclerosis (ICD10-I70.0). Electronically Signed   By: Inge Rise M.D.   On: 06/24/2020 14:01   DG Chest Portable 1 View  Result Date: 06/24/2020 CLINICAL DATA:  Chest pain and hypertension. EXAM: PORTABLE CHEST 1 VIEW COMPARISON:  Chest x-ray dated October 21, 2017. FINDINGS: Unchanged cardiomegaly. New diffuse interstitial opacity. No focal consolidation, pleural effusion, or pneumothorax. No acute osseous abnormality. IMPRESSION: 1. New interstitial pulmonary edema. Electronically Signed   By: Titus Dubin M.D.   On: 06/24/2020 12:59   ECHOCARDIOGRAM COMPLETE  Result Date: 06/25/2020    ECHOCARDIOGRAM REPORT   Patient Name:   Laura Best Vari Date of Exam: 06/25/2020 Medical Rec #:  [id number]     Height:       63.0 in Accession #:    [id number]    Weight:       172.4 lb Date of Birth:  Jun 15, 1940     BSA:          1.815 m Patient Age:    53 years      BP:           117/65 mmHg Patient Gender: F             HR:           64 bpm. Exam Location:  [facility] Procedure: 2D Echo Indications:    Congestive Heart Failure I50.9  History:        Patient has prior history of Echocardiogram examinations, most                 recent 10/22/2017. CHF, Arrythmias:Atrial Fibrillation; Risk                 Factors:Diabetes and Dyslipidemia. Bradycardia, Pulmonary  Artery                 Hypertension,                 History of atrial septal defect repair.  Sonographer:    Leavy Cella RDCS (AE) Referring Phys: [place]  1. Left ventricular ejection fraction, by estimation, is 55 to 60%. The left ventricle has normal function. The left ventricle has no regional wall motion abnormalities. There is moderate left ventricular hypertrophy. Left ventricular diastolic parameters are  indeterminate.  2. Right ventricular systolic function is mildly reduced. The right ventricular size is moderately enlarged. There is normal pulmonary artery systolic pressure. The estimated right ventricular systolic pressure is 47.0 mmHg.  3. Left atrial size was severely dilated.  4. Right atrial size was moderately dilated.  5. The mitral valve is degenerative. Trivial mitral valve regurgitation. Moderate mitral annular calcification.  6. The aortic valve is tricuspid. There is mild calcification of the aortic valve. Aortic valve regurgitation is not visualized.  7. The inferior vena cava is dilated in size with >50% respiratory variability, suggesting right atrial pressure of 8 mmHg. FINDINGS  Left Ventricle: Left ventricular ejection fraction, by estimation, is 55 to 60%. The left ventricle has normal function. The left ventricle has no regional wall motion abnormalities. The left ventricular internal cavity size was normal in size. There is  moderate left ventricular hypertrophy. Left ventricular diastolic parameters are indeterminate. Right Ventricle: The right ventricular size is moderately enlarged. No increase in right ventricular wall thickness. Right ventricular systolic function is mildly reduced. There is normal pulmonary artery systolic pressure. The tricuspid regurgitant velocity is 2.52 m/s, and with an assumed right atrial pressure of 8 mmHg, the estimated right ventricular systolic pressure is 92.9 mmHg. Left Atrium: Left atrial size was severely  dilated. Right Atrium: Right atrial size was moderately dilated. Pericardium: There is no evidence of pericardial effusion. Presence of pericardial fat pad. Mitral Valve: The mitral valve is degenerative in appearance. There is mild calcification of the mitral valve leaflet(s). Moderate mitral annular calcification. Trivial mitral valve regurgitation. Tricuspid Valve: The tricuspid valve is grossly normal. Tricuspid valve regurgitation is mild. Aortic Valve: The aortic valve is tricuspid. There is mild calcification of the aortic valve. There is moderate aortic valve annular calcification. Aortic valve regurgitation is not visualized. Pulmonic Valve: The pulmonic valve was not well visualized. Pulmonic valve regurgitation is trivial. Aorta: The aortic root is normal in size and structure. Venous: The inferior vena cava is dilated in size with greater than 50% respiratory variability, suggesting right atrial pressure of 8 mmHg. IAS/Shunts: No atrial level shunt detected by color flow Doppler.  LEFT VENTRICLE PLAX 2D LVIDd:         4.56 cm  Diastology LVIDs:         3.27 cm  LV e' medial:    5.70 cm/s LV PW:         1.55 cm  LV E/e' medial:  23.3 LV IVS:        1.34 cm  LV e' lateral:   7.28 cm/s LVOT diam:     1.90 cm  LV E/e' lateral: 18.3 LVOT Area:     2.84 cm  RIGHT VENTRICLE RV S prime:     7.05 cm/s TAPSE (M-mode): 1.2 cm LEFT ATRIUM             Index       RIGHT ATRIUM           Index LA diam:        4.00 cm 2.20 cm/m  RA Area:     23.80 cm LA Vol (A2C):   62.2 ml 34.26 ml/m RA Volume:   86.60 ml  47.70 ml/m LA Vol (A4C):   94.6 ml 52.11 ml/m LA Biplane Vol: 84.2 ml 46.38 ml/m   AORTA Ao Root diam: 2.80 cm MITRAL VALVE                TRICUSPID VALVE MV Area (PHT): 2.59  cm     TR Peak grad:   25.4 mmHg MV Decel Time: 293 msec     TR Vmax:        252.00 cm/s MV E velocity: 133.00 cm/s MV A velocity: 33.40 cm/s   SHUNTS MV E/A ratio:  3.98         Systemic Diam: 1.90 cm Rozann Lesches MD Electronically  signed by Rozann Lesches MD Signature Date/Time: 06/25/2020/3:14:50 PM    Final        Subjective: Feeling better.  Denies any shortness of breath.  Discharge Exam: Vitals:   06/25/20 1317 06/25/20 2136 06/26/20 0556 06/26/20 0745  BP: 117/65 135/74 123/65   Pulse: 64 78 70   Resp: $Remo'18 18 18   'WNCEy$ Temp: 98.5 F (36.9 C) 98.3 F (36.8 C) 98.3 F (36.8 C)   TempSrc: Oral Oral Oral   SpO2: 100% 90% 93% 93%  Weight:   78.1 kg   Height:        General: Pt is alert, awake, not in acute distress Cardiovascular: RRR, S1/S2 +, no rubs, no gallops Respiratory: CTA bilaterally, no wheezing, no rhonchi Abdominal: Soft, NT, ND, bowel sounds + Extremities: no edema, no cyanosis    The results of significant diagnostics from this hospitalization (including imaging, microbiology, ancillary and laboratory) are listed below for reference.     Microbiology: Recent Results (from the past 240 hour(s))  Resp Panel by RT-PCR (Flu A&B, Covid) Nasopharyngeal Swab     Status: None   Collection Time: 06/24/20  2:25 PM   Specimen: Nasopharyngeal Swab; Nasopharyngeal(NP) swabs in vial transport medium  Result Value Ref Range Status   SARS Coronavirus 2 by RT PCR NEGATIVE NEGATIVE Final    Comment: (NOTE) SARS-CoV-2 target nucleic acids are NOT DETECTED.  The SARS-CoV-2 RNA is generally detectable in upper respiratory specimens during the acute phase of infection. The lowest concentration of SARS-CoV-2 viral copies this assay can detect is 138 copies/mL. A negative result does not preclude SARS-Cov-2 infection and should not be used as the sole basis for treatment or other patient management decisions. A negative result may occur with  improper specimen collection/handling, submission of specimen other than nasopharyngeal swab, presence of viral mutation(s) within the areas targeted by this assay, and inadequate number of viral copies(<138 copies/mL). A negative result must be combined  with clinical observations, patient history, and epidemiological information. The expected result is Negative.  Fact Sheet for Patients:  EntrepreneurPulse.com.au  Fact Sheet for Healthcare Providers:  IncredibleEmployment.be  This test is no t yet approved or cleared by the [place] FDA and  has been authorized for detection and/or diagnosis of SARS-CoV-2 by FDA under an Emergency Use Authorization (EUA). This EUA will remain  in effect (meaning this test can be used) for the duration of the COVID-19 declaration under Section 564(b)(1) of the Act, 21 U.S.C.section 360bbb-3(b)(1), unless the authorization is terminated  or revoked sooner.       Influenza A by PCR NEGATIVE NEGATIVE Final   Influenza B by PCR NEGATIVE NEGATIVE Final    Comment: (NOTE) The Xpert Xpress SARS-CoV-2/FLU/RSV plus assay is intended as an aid in the diagnosis of influenza from Nasopharyngeal swab specimens and should not be used as a sole basis for treatment. Nasal washings and aspirates are unacceptable for Xpert Xpress SARS-CoV-2/FLU/RSV testing.  Fact Sheet for Patients: EntrepreneurPulse.com.au  Fact Sheet for Healthcare Providers: IncredibleEmployment.be  This test is not yet approved or cleared by the [place] FDA and has  been authorized for detection and/or diagnosis of SARS-CoV-2 by FDA under an Emergency Use Authorization (EUA). This EUA will remain in effect (meaning this test can be used) for the duration of the COVID-19 declaration under Section 564(b)(1) of the Act, 21 U.S.C. section 360bbb-3(b)(1), unless the authorization is terminated or revoked.  Performed at [facility], [address], [place], [place] [address]      Labs: BNP (last 3 results) Recent Labs    06/24/20 1257  BNP 846.6*   Basic Metabolic Panel: Recent Labs  Lab 06/24/20 1257 06/25/20 1506 06/26/20 0502  NA 139 135 136  K  3.9 3.9 4.1  CL 104 96* 98  CO2 26 32 32  GLUCOSE 161* 192* 168*  BUN 24* 26* 33*  CREATININE 1.34* 1.62* 1.73*  CALCIUM 9.0 8.6* 8.6*   Liver Function Tests: Recent Labs  Lab 06/24/20 1257  AST 16  ALT 13  ALKPHOS 102  BILITOT 0.6  PROT 7.1  ALBUMIN 3.7   Recent Labs  Lab 06/24/20 1257  LIPASE 21   No results for input(s): AMMONIA in the last 168 hours. CBC: Recent Labs  Lab 06/24/20 1257 06/25/20 0645  WBC 8.4 6.2  HGB 12.8 10.6*  HCT 40.1 35.0*  MCV 90.3 91.6  PLT 204 177   Cardiac Enzymes: No results for input(s): CKTOTAL, CKMB, CKMBINDEX, TROPONINI in the last 168 hours. BNP: Invalid input(s): POCBNP CBG: Recent Labs  Lab 06/25/20 1123 06/25/20 1620 06/25/20 2139 06/26/20 0735 06/26/20 1113  GLUCAP 235* 200* 176* 148* 242*   D-Dimer No results for input(s): DDIMER in the last 72 hours. Hgb A1c No results for input(s): HGBA1C in the last 72 hours. Lipid Profile No results for input(s): CHOL, HDL, LDLCALC, TRIG, CHOLHDL, LDLDIRECT in the last 72 hours. Thyroid function studies No results for input(s): TSH, T4TOTAL, T3FREE, THYROIDAB in the last 72 hours.  Invalid input(s): FREET3 Anemia work up No results for input(s): VITAMINB12, FOLATE, FERRITIN, TIBC, IRON, RETICCTPCT in the last 72 hours. Urinalysis    Component Value Date/Time   COLORURINE YELLOW 01/13/2017 [place] 01/13/2017 1253   LABSPEC 1.015 01/13/2017 1253   PHURINE 5.0 01/13/2017 1253   GLUCOSEU 50 (A) 01/13/2017 1253   HGBUR NEGATIVE 01/13/2017 1253   BILIRUBINUR NEGATIVE 01/13/2017 1253   KETONESUR 5 (A) 01/13/2017 1253   PROTEINUR 30 (A) 01/13/2017 1253   UROBILINOGEN 0.2 05/03/2014 0645   NITRITE NEGATIVE 01/13/2017 1253   LEUKOCYTESUR SMALL (A) 01/13/2017 1253   Sepsis Labs Invalid input(s): PROCALCITONIN,  WBC,  LACTICIDVEN Microbiology Recent Results (from the past 240 hour(s))  Resp Panel by RT-PCR (Flu A&B, Covid) Nasopharyngeal Swab      Status: None   Collection Time: 06/24/20  2:25 PM   Specimen: Nasopharyngeal Swab; Nasopharyngeal(NP) swabs in vial transport medium  Result Value Ref Range Status   SARS Coronavirus 2 by RT PCR NEGATIVE NEGATIVE Final    Comment: (NOTE) SARS-CoV-2 target nucleic acids are NOT DETECTED.  The SARS-CoV-2 RNA is generally detectable in upper respiratory specimens during the acute phase of infection. The lowest concentration of SARS-CoV-2 viral copies this assay can detect is 138 copies/mL. A negative result does not preclude SARS-Cov-2 infection and should not be used as the sole basis for treatment or other patient management decisions. A negative result may occur with  improper specimen collection/handling, submission of specimen other than nasopharyngeal swab, presence of viral mutation(s) within the areas targeted by this assay, and inadequate number of viral copies(<138  copies/mL). A negative result must be combined with clinical observations, patient history, and epidemiological information. The expected result is Negative.  Fact Sheet for Patients:  EntrepreneurPulse.com.au  Fact Sheet for Healthcare Providers:  IncredibleEmployment.be  This test is no t yet approved or cleared by the [place] FDA and  has been authorized for detection and/or diagnosis of SARS-CoV-2 by FDA under an Emergency Use Authorization (EUA). This EUA will remain  in effect (meaning this test can be used) for the duration of the COVID-19 declaration under Section 564(b)(1) of the Act, 21 U.S.C.section 360bbb-3(b)(1), unless the authorization is terminated  or revoked sooner.       Influenza A by PCR NEGATIVE NEGATIVE Final   Influenza B by PCR NEGATIVE NEGATIVE Final    Comment: (NOTE) The Xpert Xpress SARS-CoV-2/FLU/RSV plus assay is intended as an aid in the diagnosis of influenza from Nasopharyngeal swab specimens and should not be used as a sole basis  for treatment. Nasal washings and aspirates are unacceptable for Xpert Xpress SARS-CoV-2/FLU/RSV testing.  Fact Sheet for Patients: EntrepreneurPulse.com.au  Fact Sheet for Healthcare Providers: IncredibleEmployment.be  This test is not yet approved or cleared by the [place] FDA and has been authorized for detection and/or diagnosis of SARS-CoV-2 by FDA under an Emergency Use Authorization (EUA). This EUA will remain in effect (meaning this test can be used) for the duration of the COVID-19 declaration under Section 564(b)(1) of the Act, 21 U.S.C. section 360bbb-3(b)(1), unless the authorization is terminated or revoked.  Performed at [facility], [address] [place], [place] [address]      Time coordinating discharge: 12mins  SIGNED:   Kathie Dike, MD  [facility] 06/26/2020, 1:38 PM   If 7PM-7AM, please contact night-coverage [email or web address]

## 2020-06-26 NOTE — Plan of Care (Signed)
  Problem: Education: Goal: Knowledge of General Education information will improve Description Including pain rating scale, medication(s)/side effects and non-pharmacologic comfort measures Outcome: Progressing   Problem: Health Behavior/Discharge Planning: Goal: Ability to manage health-related needs will improve Outcome: Progressing   

## 2020-06-26 NOTE — Progress Notes (Signed)
Nsg Discharge Note  Admit Date:  06/24/2020 Discharge date: 06/26/2020   Laura Best to be D/C'd Home per MD order.  AVS completed.  Copy for chart, and copy for patient signed, and dated. Patient/caregiver able to verbalize understanding. Removed IV-CDI. Reviewed d/c paperwork with patient. Answered all questions. Walked stable patient with walker to the main entrance where she was picked up by her son. Discharge Medication: Allergies as of 06/26/2020      Reactions   Ace Inhibitors Other (See Comments)   Hyperkalemia   Actos [pioglitazone] Swelling   Aspirin Other (See Comments)   G.I. Upset. In high doses   Metformin And Related Diarrhea   Eliquis [apixaban] Rash, Other (See Comments)   Patient states that this med was d/ced by prescriber due to interference with allergies and states that she also had a breakout      Medication List    STOP taking these medications   calcitRIOL 0.25 MCG capsule Commonly known as: ROCALTROL   ondansetron 4 MG disintegrating tablet Commonly known as: Zofran ODT   predniSONE 20 MG tablet Commonly known as: DELTASONE   psyllium 95 % Pack Commonly known as: HYDROCIL/METAMUCIL     TAKE these medications   Accu-Chek Guide Me w/Device Kit USE TO CHECK BLOOD SUGAR 4  TIMES DAILY BEFORE MEALS  AND AT BEDTIME   Accu-Chek Softclix Lancets lancets CHECK BLOOD SUGAR 3 TIMES  DAILY BEFORE MEALS AND AT  BEDTIME   acetaminophen 500 MG tablet Commonly known as: TYLENOL Take 500 mg by mouth every 6 (six) hours as needed for pain.   amLODipine 10 MG tablet Commonly known as: NORVASC TAKE ONE-HALF TABLET BY  MOUTH DAILY   BLOOD GLUCOSE TEST STRIPS Strp Please dispense as Accu-Chek Guide Me. Use as directed to monitor FSBS 3x daily. Dx: E11.9.   Accu-Chek Aviva Plus test strip Generic drug: glucose blood CHECK BLOOD SUGAR 3 TIMES  DAILY BEFORE MEALS AND AT  BEDTIME.   Accu-Chek Guide test strip Generic drug: glucose blood CHECK BLOOD SUGAR 3  TIMES  DAILY BEFORE MEALS AND AT  BEDTIME   carvedilol 12.5 MG tablet Commonly known as: COREG TAKE 1 TABLET BY MOUTH  TWICE DAILY   cloNIDine 0.3 MG tablet Commonly known as: CATAPRES TAKE 1 TABLET BY MOUTH  TWICE DAILY   dicyclomine 20 MG tablet Commonly known as: BENTYL Take 1 tablet (20 mg total) by mouth every 6 (six) hours as needed for spasms (intestinal spasms).   febuxostat 40 MG tablet Commonly known as: ULORIC TAKE 1 TABLET BY MOUTH  DAILY   fluticasone 50 MCG/ACT nasal spray Commonly known as: FLONASE Place 2 sprays into both nostrils daily.   furosemide 40 MG tablet Commonly known as: LASIX TAKE 1 AND 1/2 TABLETS BY  MOUTH DAILY   gabapentin 100 MG capsule Commonly known as: NEURONTIN TAKE 1 TO 2 CAPSULES BY  MOUTH AT BEDTIME FOR  PERIPHERAL NEUROPATHY What changed: See the new instructions.   guaiFENesin 600 MG 12 hr tablet Commonly known as: Mucinex Take 1 tablet (600 mg total) by mouth 2 (two) times daily.   HumaLOG KwikPen 100 UNIT/ML KwikPen Generic drug: insulin lispro INJECT SUBCUTANEOUSLY 7  UNITS 3 TIMES DAILY What changed: See the new instructions.   ipratropium-albuterol 0.5-2.5 (3) MG/3ML Soln Commonly known as: DUONEB Take 3 mLs by nebulization every 6 (six) hours as needed.   Levemir FlexTouch 100 UNIT/ML FlexPen Generic drug: insulin detemir Inject 20 Units into the skin at bedtime.  SUBCUTANEOUSLY ONCE DAILY AT  10  PM   linaclotide 145 MCG Caps capsule Commonly known as: LINZESS Take 1 capsule (145 mcg total) by mouth daily before breakfast.   omeprazole 20 MG capsule Commonly known as: PRILOSEC TAKE 1 CAPSULE BY MOUTH  DAILY   Pen Needles 31G X 6 MM Misc Use as directed to inject insulin SQ TID.   polyethylene glycol 17 g packet Commonly known as: MiraLax Take 17 g by mouth daily. Hold for diarrhea.   simvastatin 20 MG tablet Commonly known as: ZOCOR TAKE 1 TABLET BY MOUTH ONCE DAILY AT BEDTIME   Trelegy Ellipta  100-62.5-25 MCG/INH Aepb Generic drug: Fluticasone-Umeclidin-Vilant Inhale 1 Inhaler into the lungs daily.   Vitamin D 50 MCG (2000 UT) Caps Take 2,000 Units by mouth daily.   Xarelto 15 MG Tabs tablet Generic drug: Rivaroxaban TAKE 1 TABLET BY MOUTH  DAILY       Discharge Assessment: Vitals:   06/26/20 0556 06/26/20 0745  BP: 123/65   Pulse: 70   Resp: 18   Temp: 98.3 F (36.8 C)   SpO2: 93% 93%   Skin clean, dry and intact without evidence of skin break down, no evidence of skin tears noted. IV catheter discontinued intact. Site without signs and symptoms of complications - no redness or edema noted at insertion site, patient denies c/o pain - only slight tenderness at site.  Dressing with slight pressure applied.  D/c Instructions-Education: Discharge instructions given to patient/family with verbalized understanding. D/c education completed with patient/family including follow up instructions, medication list, d/c activities limitations if indicated, with other d/c instructions as indicated by MD - patient able to verbalize understanding, all questions fully answered. Patient instructed to return to ED, call 911, or call MD for any changes in condition.  Patient escorted via [place], and D/C home via private auto.  Santa Lighter, RN 06/26/2020 2:25 PM

## 2020-06-26 NOTE — Plan of Care (Signed)
  Problem: Education: Goal: Knowledge of General Education information will improve Description: Including pain rating scale, medication(s)/side effects and non-pharmacologic comfort measures 06/26/2020 1340 by Santa Lighter, RN Outcome: Adequate for Discharge 06/26/2020 0840 by Santa Lighter, RN Outcome: Progressing   Problem: Health Behavior/Discharge Planning: Goal: Ability to manage health-related needs will improve 06/26/2020 1340 by Santa Lighter, RN Outcome: Adequate for Discharge 06/26/2020 0840 by Santa Lighter, RN Outcome: Progressing   Problem: Clinical Measurements: Goal: Ability to maintain clinical measurements within normal limits will improve Outcome: Adequate for Discharge Goal: Will remain free from infection Outcome: Adequate for Discharge Goal: Diagnostic test results will improve Outcome: Adequate for Discharge Goal: Respiratory complications will improve Outcome: Adequate for Discharge Goal: Cardiovascular complication will be avoided Outcome: Adequate for Discharge   Problem: Activity: Goal: Risk for activity intolerance will decrease Outcome: Adequate for Discharge   Problem: Nutrition: Goal: Adequate nutrition will be maintained Outcome: Adequate for Discharge   Problem: Coping: Goal: Level of anxiety will decrease Outcome: Adequate for Discharge   Problem: Elimination: Goal: Will not experience complications related to bowel motility Outcome: Adequate for Discharge Goal: Will not experience complications related to urinary retention Outcome: Adequate for Discharge   Problem: Pain Managment: Goal: General experience of comfort will improve Outcome: Adequate for Discharge   Problem: Safety: Goal: Ability to remain free from injury will improve Outcome: Adequate for Discharge   Problem: Skin Integrity: Goal: Risk for impaired skin integrity will decrease Outcome: Adequate for Discharge   Problem: Education: Goal: Ability to demonstrate  management of disease process will improve Outcome: Adequate for Discharge Goal: Ability to verbalize understanding of medication therapies will improve Outcome: Adequate for Discharge Goal: Individualized Educational Video(s) Outcome: Adequate for Discharge   Problem: Activity: Goal: Capacity to carry out activities will improve Outcome: Adequate for Discharge   Problem: Cardiac: Goal: Ability to achieve and maintain adequate cardiopulmonary perfusion will improve Outcome: Adequate for Discharge

## 2020-06-30 ENCOUNTER — Telehealth (INDEPENDENT_AMBULATORY_CARE_PROVIDER_SITE_OTHER): Payer: Medicare Other | Admitting: Nurse Practitioner

## 2020-06-30 VITALS — BP 116/73 | Wt 170.0 lb

## 2020-06-30 DIAGNOSIS — R635 Abnormal weight gain: Secondary | ICD-10-CM

## 2020-06-30 DIAGNOSIS — K5900 Constipation, unspecified: Secondary | ICD-10-CM | POA: Diagnosis not present

## 2020-06-30 NOTE — Progress Notes (Signed)
Subjective:    Patient ID: Laura Best, female    DOB: Jun 11, 1940, 80 y.o.   MRN: [id number]  HPI: Laura Best is a 80 y.o. female presenting for blood pressure follow up.  Chief Complaint  Patient presents with   Follow-up    Pt says her bp has been running low, she is currently at 116/73, p 70. Regular wt is 159lb, this morning 170lb. Increase of lasix to 1 1/2 pill. Claims to be feeling better since coming home from hosp.   HYPERTENSION Recently discharged from hospital.  Has hospital follow up appointment with PCP next week.  Currently taking amlodipine 10 mg, clonidine 0.3 mg twice daily, and coreg 12.5 mg twice daily for blood pressure.  Has been taking furosemide per discharge instructions. Reports concern that this morning, her weight was increased. Hypertension status: uncontrolled  Satisfied with current treatment? yes Duration of hypertension: chronic BP monitoring frequency:  daily BP range: 116/73 BP medication side effects:  no Medication compliance: excellent Aspirin: no Recurrent headaches: no Visual changes: no Palpitations: no Dyspnea: no Chest pain: no Lower extremity edema: no Dizzy/lightheaded: no  Patient reports her weight was 170.6 lbs this am.  Reports yesterday, it was 158.6 lbs.  When she was discharged from the hospital, she weighed 172 lbs.    Denies shortness of breath, chest pain, fluid on her stomach, lower extremity swelling.  Reports her stomach, "goes in" when she pushes on it.  Is not hard like it was when she went to the hospital.   She is "peeing a whole lot."  Urine is clear to light yellow.   Bowels haven't moved since she left the hospital.  Is taking Linzess, Miralax, Senna.  She is eating, no belly pain.  No nausea or vomiting.   Allergies  Allergen Reactions   Ace Inhibitors Other (See Comments)    Hyperkalemia   Actos [Pioglitazone] Swelling   Aspirin Other (See Comments)    G.I. Upset. In high doses   Metformin And  Related Diarrhea   Eliquis [Apixaban] Rash and Other (See Comments)    Patient states that this med was d/ced by prescriber due to interference with allergies and states that she also had a breakout    Outpatient Encounter Medications as of 06/30/2020  Medication Sig   ACCU-CHEK GUIDE test strip CHECK BLOOD SUGAR 3 TIMES  DAILY BEFORE MEALS AND AT  BEDTIME   Accu-Chek Softclix Lancets lancets CHECK BLOOD SUGAR 3 TIMES  DAILY BEFORE MEALS AND AT  BEDTIME   acetaminophen (TYLENOL) 500 MG tablet Take 500 mg by mouth every 6 (six) hours as needed for pain.   amLODipine (NORVASC) 10 MG tablet TAKE ONE-HALF TABLET BY  MOUTH DAILY   Blood Glucose Monitoring Suppl (ACCU-CHEK GUIDE ME) w/Device KIT USE TO CHECK BLOOD SUGAR 4  TIMES DAILY BEFORE MEALS  AND AT BEDTIME   carvedilol (COREG) 12.5 MG tablet TAKE 1 TABLET BY MOUTH  TWICE DAILY   Cholecalciferol (VITAMIN D) 2000 UNITS CAPS Take 2,000 Units by mouth daily.    cloNIDine (CATAPRES) 0.3 MG tablet TAKE 1 TABLET BY MOUTH  TWICE DAILY   dicyclomine (BENTYL) 20 MG tablet Take 1 tablet (20 mg total) by mouth every 6 (six) hours as needed for spasms (intestinal spasms).   febuxostat (ULORIC) 40 MG tablet TAKE 1 TABLET BY MOUTH  DAILY   fluticasone (FLONASE) 50 MCG/ACT nasal spray Place 2 sprays into both nostrils daily.   Fluticasone-Umeclidin-Vilant (TRELEGY ELLIPTA) 100-62.5-25  MCG/INH AEPB Inhale 1 Inhaler into the lungs daily.   furosemide (LASIX) 40 MG tablet TAKE 1 AND 1/2 TABLETS BY  MOUTH DAILY   gabapentin (NEURONTIN) 100 MG capsule TAKE 1 TO 2 CAPSULES BY  MOUTH AT BEDTIME FOR  PERIPHERAL NEUROPATHY (Patient taking differently: Take 100 mg by mouth See admin instructions. TAKE 1 TO 2 CAPSULES BY MOUTH AT BEDTIME FOR PERIPHERAL NEUROPATHY.)   glucose blood (ACCU-CHEK AVIVA PLUS) test strip CHECK BLOOD SUGAR 3 TIMES  DAILY BEFORE MEALS AND AT  BEDTIME.   Glucose Blood (BLOOD GLUCOSE TEST STRIPS) STRP Please dispense as Accu-Chek Guide Me. Use as  directed to monitor FSBS 3x daily. Dx: E11.9.   guaiFENesin (MUCINEX) 600 MG 12 hr tablet Take 1 tablet (600 mg total) by mouth 2 (two) times daily.   HUMALOG KWIKPEN 100 UNIT/ML KwikPen INJECT SUBCUTANEOUSLY 7  UNITS 3 TIMES DAILY (Patient taking differently: Inject 7 Units into the skin 3 (three) times daily.)   insulin detemir (LEVEMIR FLEXTOUCH) 100 UNIT/ML FlexPen Inject 20 Units into the skin at bedtime. SUBCUTANEOUSLY ONCE DAILY AT  10  PM   Insulin Pen Needle (PEN NEEDLES) 31G X 6 MM MISC Use as directed to inject insulin SQ TID.   ipratropium-albuterol (DUONEB) 0.5-2.5 (3) MG/3ML SOLN Take 3 mLs by nebulization every 6 (six) hours as needed.   linaclotide (LINZESS) 145 MCG CAPS capsule Take 1 capsule (145 mcg total) by mouth daily before breakfast.   omeprazole (PRILOSEC) 20 MG capsule TAKE 1 CAPSULE BY MOUTH  DAILY   polyethylene glycol (MIRALAX) packet Take 17 g by mouth daily. Hold for diarrhea.   simvastatin (ZOCOR) 20 MG tablet TAKE 1 TABLET BY MOUTH ONCE DAILY AT BEDTIME   XARELTO 15 MG TABS tablet TAKE 1 TABLET BY MOUTH  DAILY   No facility-administered encounter medications on file as of 06/30/2020.    Patient Active Problem List   Diagnosis Date Noted   Acute on chronic diastolic (congestive) heart failure ([place]) 06/25/2020   Atrial fibrillation ([place])    Acute respiratory failure with hypoxia ([place]) 10/22/2017   Uncontrolled type 2 diabetes mellitus with hyperglycemia ([place]) 10/22/2017   Atrial fibrillation, chronic 10/22/2017   Acute on chronic respiratory failure ([place]) 10/22/2017   Persistent atrial fibrillation ([place])    Acute on chronic respiratory failure with hypoxia ([place]) 01/13/2017   Diabetic peripheral neuropathy associated with type 2 diabetes mellitus ([place]) 05/11/2016   PAH (pulmonary artery hypertension) ([place])    History of atrial septal defect repair    Leukocytosis 62/83/6629   Diastolic CHF, acute on chronic ([place]) 04/05/2016   SBO (small bowel obstruction)  ([place]) 02/17/2016   Chronic diastolic congestive heart failure ([place]) 02/17/2016   Diabetes mellitus with complication ([place]) 47/65/4650   Pyuria 02/17/2016   GERD (gastroesophageal reflux disease) 04/05/2015   Acute diverticulitis 05/03/2014   Bradycardia 04/02/2013   Hyperkalemia 01/22/2013   Anemia of chronic disease 01/21/2013   Acute bronchitis 01/20/2013   CKD (chronic kidney disease) stage 3, GFR 30-59 ml/min ([place]) 12/25/2012   Screening for colorectal cancer 12/25/2012   Breast cancer screening 12/25/2012   Vitamin D deficiency    Osteoporosis    Postop check 08/13/2012   Appendiceal abscess 07/30/2012   Fever, unspecified 07/19/2012   Abdominal pain 07/19/2012   SOB (shortness of breath) 07/19/2012   ARF (acute renal failure) ([place]) 07/19/2012   Abscess, appendix 07/19/2012   Type 2 diabetes with nephropathy ([place]) 02/02/2011   Pulmonary hypertension ([place]) 02/02/2011   COPD  exacerbation ([facility]) 02/02/2011   Hypokalemia 02/02/2011   Normocytic anemia 02/02/2011   Essential hypertension    Hyperlipidemia     Past Medical History:  Diagnosis Date   Arthritis    "hands, feet" (02/17/2016)   Atrial fibrillation (H[facility])    CAD (coronary artery disease)    a. 05/2016: cath showing nonobstructive CAD with 50% prox-Cx stenosis   CKD (chronic kidney disease) stage 3, GFR 30-59 ml/min (H[facility]) 12/25/2012   COPD (chronic obstructive pulmonary disease) (HCC)    Diastolic heart failure (HCC)    Hyperlipidemia    Hypertension    Osteoporosis    Pneumonia    "several times" (02/17/2016)   Small bowel obstruction (HCC) 02/17/2016   Tricuspid regurgitation    Echo 05/02/06-Nml LV. Mild MR. Mod TR   Type II diabetes mellitus (HCC)    Vitamin D deficiency     Relevant past medical, surgical, family and social history reviewed and updated as indicated. Interim medical history since our last visit reviewed.  Review of Systems Per HPI unless specifically indicated above     Objective:     BP 116/73   Wt 170 lb (77.1 kg)   BMI 30.11 kg/m   Wt Readings from Last 3 Encounters:  06/30/20 170 lb (77.1 kg)  06/26/20 172 lb 2.9 oz (78.1 kg)  01/06/20 175 lb (79.4 kg)    Physical Exam Physical examination unable to be performed due to lack of equipment.  Patient talking in complete sentences during telemedicine visit.  Results for orders placed or performed during the hospital encounter of 06/24/20  Resp Panel by RT-PCR (Flu A&B, Covid) Nasopharyngeal Swab   Specimen: Nasopharyngeal Swab; Nasopharyngeal(NP) swabs in vial transport medium  Result Value Ref Range   SARS Coronavirus 2 by RT PCR NEGATIVE NEGATIVE   Influenza A by PCR NEGATIVE NEGATIVE   Influenza B by PCR NEGATIVE NEGATIVE  Basic metabolic panel  Result Value Ref Range   Sodium 139 135 - 145 mmol/L   Potassium 3.9 3.5 - 5.1 mmol/L   Chloride 104 98 - 111 mmol/L   CO2 26 22 - 32 mmol/L   Glucose, Bld 161 (H) 70 - 99 mg/dL   BUN 24 (H) 8 - 23 mg/dL   Creatinine, Ser 2.80 (H) 0.44 - 1.00 mg/dL   Calcium 9.0 8.9 - 03.6 mg/dL   GFR, Estimated 40 (L) >60 mL/min   Anion gap 9 5 - 15  CBC  Result Value Ref Range   WBC 8.4 4.0 - 10.5 K/uL   RBC 4.44 3.87 - 5.11 MIL/uL   Hemoglobin 12.8 12.0 - 15.0 g/dL   HCT 64.7 04.3 - 55.7 %   MCV 90.3 80.0 - 100.0 fL   MCH 28.8 26.0 - 34.0 pg   MCHC 31.9 30.0 - 36.0 g/dL   RDW 80.0 63.2 - 15.3 %   Platelets 204 150 - 400 K/uL   nRBC 0.0 0.0 - 0.2 %  Hepatic function panel  Result Value Ref Range   Total Protein 7.1 6.5 - 8.1 g/dL   Albumin 3.7 3.5 - 5.0 g/dL   AST 16 15 - 41 U/L   ALT 13 0 - 44 U/L   Alkaline Phosphatase 102 38 - 126 U/L   Total Bilirubin 0.6 0.3 - 1.2 mg/dL   Bilirubin, Direct 0.1 0.0 - 0.2 mg/dL   Indirect Bilirubin 0.5 0.3 - 0.9 mg/dL  Lipase, blood  Result Value Ref Range   Lipase 21 11 - 51 U/L  Brain natriuretic peptide  Result Value Ref Range   B Natriuretic Peptide 465.0 (H) 0.0 - 100.0 pg/mL  CBC  Result Value Ref Range   WBC 6.2  4.0 - 10.5 K/uL   RBC 3.82 (L) 3.87 - 5.11 MIL/uL   Hemoglobin 10.6 (L) 12.0 - 15.0 g/dL   HCT 35.0 (L) 36.0 - 46.0 %   MCV 91.6 80.0 - 100.0 fL   MCH 27.7 26.0 - 34.0 pg   MCHC 30.3 30.0 - 36.0 g/dL   RDW 15.1 11.5 - 15.5 %   Platelets 177 150 - 400 K/uL   nRBC 0.0 0.0 - 0.2 %  Glucose, capillary  Result Value Ref Range   Glucose-Capillary 115 (H) 70 - 99 mg/dL  Glucose, capillary  Result Value Ref Range   Glucose-Capillary 179 (H) 70 - 99 mg/dL  Glucose, capillary  Result Value Ref Range   Glucose-Capillary 172 (H) 70 - 99 mg/dL  Basic metabolic panel  Result Value Ref Range   Sodium 135 135 - 145 mmol/L   Potassium 3.9 3.5 - 5.1 mmol/L   Chloride 96 (L) 98 - 111 mmol/L   CO2 32 22 - 32 mmol/L   Glucose, Bld 192 (H) 70 - 99 mg/dL   BUN 26 (H) 8 - 23 mg/dL   Creatinine, Ser 1.62 (H) 0.44 - 1.00 mg/dL   Calcium 8.6 (L) 8.9 - 10.3 mg/dL   GFR, Estimated 32 (L) >60 mL/min   Anion gap 7 5 - 15  Glucose, capillary  Result Value Ref Range   Glucose-Capillary 235 (H) 70 - 99 mg/dL  Glucose, capillary  Result Value Ref Range   Glucose-Capillary 200 (H) 70 - 99 mg/dL   Comment 1 Notify RN    Comment 2 Document in Chart   Basic metabolic panel  Result Value Ref Range   Sodium 136 135 - 145 mmol/L   Potassium 4.1 3.5 - 5.1 mmol/L   Chloride 98 98 - 111 mmol/L   CO2 32 22 - 32 mmol/L   Glucose, Bld 168 (H) 70 - 99 mg/dL   BUN 33 (H) 8 - 23 mg/dL   Creatinine, Ser 1.73 (H) 0.44 - 1.00 mg/dL   Calcium 8.6 (L) 8.9 - 10.3 mg/dL   GFR, Estimated 30 (L) >60 mL/min   Anion gap 6 5 - 15  Glucose, capillary  Result Value Ref Range   Glucose-Capillary 176 (H) 70 - 99 mg/dL  Glucose, capillary  Result Value Ref Range   Glucose-Capillary 148 (H) 70 - 99 mg/dL  Glucose, capillary  Result Value Ref Range   Glucose-Capillary 242 (H) 70 - 99 mg/dL  ECHOCARDIOGRAM COMPLETE  Result Value Ref Range   Weight 2,758.4 oz   Height 63 in   BP 117/65 mmHg   Area-P 1/2 2.59 cm2   S'  Lateral 3.27 cm  Troponin I (High Sensitivity)  Result Value Ref Range   Troponin I (High Sensitivity) 8 <18 ng/L  Troponin I (High Sensitivity)  Result Value Ref Range   Troponin I (High Sensitivity) 15 <18 ng/L      Assessment & Plan:  1. Weight gain It sounds like patient is at her baseline dry weight.  She is not having symptoms of fluid overload today.  She is taking her medications as prescribed.  There are no red flags in her history.  I question if the scale at her home is not accurately measuring her weight.  Will not make medication adjustments today.  Continue daily  weights at home and notify with >2 lbs weight gain in 24 hours or 5 lb weight gain in 1 week. F/u as planned with PCP next week.  2. Constipation, unspecified constipation type Acute on chronic.  Encouraged use of OTC dulcolax suppository daily to help produce BM.  F/u with no success.    Follow up plan: No follow-ups on file.  This visit was completed via telephone due to the restrictions of the COVID-19 pandemic. All issues as above were discussed and addressed but no physical exam was performed. If it was felt that the patient should be evaluated in the office, they were directed there. The patient verbally consented to this visit. Patient was unable to complete an audio/visual visit due to Lack of internet. Location of the patient: home Location of the provider: work Those involved with this call:  Provider: Noemi Chapel, DNP, FNP-C CMA: Annabelle Harman, CMA Front Desk/Registration: Vevelyn Pat  Time spent on call:  13 minutes on the phone discussing health concerns. 20 minutes total spent in review of patient's record and preparation of their chart. I verified patient identity using two factors (patient name and date of birth). Patient consents verbally to being seen via telemedicine visit today.

## 2020-07-08 ENCOUNTER — Other Ambulatory Visit: Payer: Self-pay

## 2020-07-08 ENCOUNTER — Ambulatory Visit (INDEPENDENT_AMBULATORY_CARE_PROVIDER_SITE_OTHER): Payer: Medicare Other | Admitting: Family Medicine

## 2020-07-08 ENCOUNTER — Encounter: Payer: Self-pay | Admitting: Family Medicine

## 2020-07-08 VITALS — BP 128/66 | HR 82 | Temp 98.0°F | Resp 16 | Ht 63.0 in | Wt 173.0 lb

## 2020-07-08 DIAGNOSIS — J441 Chronic obstructive pulmonary disease with (acute) exacerbation: Secondary | ICD-10-CM

## 2020-07-08 DIAGNOSIS — I5032 Chronic diastolic (congestive) heart failure: Secondary | ICD-10-CM

## 2020-07-08 DIAGNOSIS — I482 Chronic atrial fibrillation, unspecified: Secondary | ICD-10-CM

## 2020-07-08 MED ORDER — PREDNISONE 20 MG PO TABS: ORAL_TABLET | ORAL | 0 refills | Status: DC

## 2020-07-08 NOTE — Progress Notes (Signed)
Subjective:    Patient ID: Laura Best, female    DOB: 1940-07-04, 80 y.o.   MRN: [id number]  Admit date: 06/24/2020 Discharge date: 06/26/2020   Admitted From: Home Disposition: Home   Recommendations for Outpatient Follow-up:  Follow up with PCP in 1-2 weeks Please obtain BMP/CBC in one week Patient will be scheduled for cardiology follow-up   Discharge Condition: Stable CODE STATUS: Full code Diet recommendation: Heart healthy, carb modified   Brief/Interim Summary: 80 year old female with a history of atrial fibrillation, diastolic heart failure, COPD, admitted to the hospital with dizziness shortness of breath and chest pain.  She was noted to be hypoxic on room air on ambulation.  Patient was found to have decompensated CHF and was admitted for intravenous diuresis   Discharge Diagnoses:  Principal Problem:   Acute respiratory failure with hypoxia (HCC) Active Problems:   Type 2 diabetes with nephropathy (HCC)   CKD (chronic kidney disease) stage 3, GFR 30-59 ml/min (HCC)   Diastolic CHF, acute on chronic (HCC)   Atrial fibrillation, chronic   Acute on chronic diastolic (congestive) heart failure (HCC)   Acute respiratory failure with hypoxia -Patient noted to have oxygen saturation dropping down to 84% on room air with ambulation -Patient was weaned off of oxygen and is currently on room air -She was able to ambulate without difficulty and did not have any hypoxia   Acute on chronic diastolic congestive heart failure -Patient was treated with intravenous Lasix and had good urine output -Currently, appears to be euvolemic -Echocardiogram shows preserved ejection fraction -She reported that she was previously taking Lasix 60 mg daily, but recently was changed to 40 mg daily.  We will change home dose of Lasix back to 60 mg daily and have her follow-up with cardiology     Atrial fibrillation, chronic -Heart rate is currently stable -Continue rate control with  Coreg -She is anticoagulated with Xarelto   Diabetes -A1c 7.3 -Blood sugars currently stable -Continue basal insulin with sliding scale   CKD stage IIIb -Baseline creatinine around 1.3 -Creatinine had trended up to 1.7 with diuresis -She will need repeat basic metabolic panel in 1 week to ensure stability of renal function   Hypertension -Continued on Norvasc, clonidine and Coreg -Blood pressure currently stable   COPD -No wheezing at this time -Continue bronchodilators    07/08/20 Patient is here today for follow-up.  She is currently on Trelegy 1 inhalation a day however she also has samples of Incruse and Breo from the hospital.  I explained to the patient that this is redundant and that she does not need to be on both medications.  She states that she has plenty of Trelegy so she prefers to resume this.  However on exam, the patient is wheezing in all 4 lung fields.  She has diminished breath sounds bilaterally.  On her hospital discharge, it stated that she was not wheezing at all.  Therefore this is significantly worse.  She does report some shortness of breath.  Her weight is near her baseline.  The last time I saw the patient she was 175 pounds here in December.  She is 173 today.  She has trace bipedal edema but she does not appear fluid overloaded.  She is in atrial fibrillation today but her heart rate is well controlled. Past Medical History:  Diagnosis Date   Arthritis    "hands, feet" (02/17/2016)   Atrial fibrillation (HCC)    CAD (coronary artery disease)  a. 05/2016: cath showing nonobstructive CAD with 50% prox-Cx stenosis   CKD (chronic kidney disease) stage 3, GFR 30-59 ml/min (HCC) 12/25/2012   COPD (chronic obstructive pulmonary disease) (HCC)    Diastolic heart failure (HCC)    Hyperlipidemia    Hypertension    Osteoporosis    Pneumonia    "several times" (02/17/2016)   Small bowel obstruction (HCC) 02/17/2016   Tricuspid regurgitation    Echo 05/02/06-Nml  LV. Mild MR. Mod TR   Type II diabetes mellitus ([place])    Vitamin D deficiency    Past Surgical History:  Procedure Laterality Date   APPENDECTOMY     HEART CHAMBER REVISION     patched hole --[place] N/A 09/27/2013   Procedure: APPENDECTOMY - CONVERTED FROM LAPAROSCOPIC;  Surgeon: Donnie Mesa, MD;  Location: [facility];  Service: General;  Laterality: N/A;   RIGHT/LEFT HEART CATH AND CORONARY ANGIOGRAPHY N/A 05/24/2016   Procedure: Right/Left Heart Cath and Coronary Angiography;  Surgeon: Larey Dresser, MD;  Location: [facility];  Service: Cardiovascular;  Laterality: N/A;   TUBAL LIGATION     Current Outpatient Medications on File Prior to Visit  Medication Sig Dispense Refill   ACCU-CHEK GUIDE test strip CHECK BLOOD SUGAR 3 TIMES  DAILY BEFORE MEALS AND AT  BEDTIME 400 strip 3   Accu-Chek Softclix Lancets lancets CHECK BLOOD SUGAR 3 TIMES  DAILY BEFORE MEALS AND AT  BEDTIME 400 each 3   acetaminophen (TYLENOL) 500 MG tablet Take 500 mg by mouth every 6 (six) hours as needed for pain.     amLODipine (NORVASC) 10 MG tablet TAKE ONE-HALF TABLET BY  MOUTH DAILY 45 tablet 3   Blood Glucose Monitoring Suppl (ACCU-CHEK GUIDE ME) w/Device KIT USE TO CHECK BLOOD SUGAR 4  TIMES DAILY BEFORE MEALS  AND AT BEDTIME 1 kit 0   carvedilol (COREG) 12.5 MG tablet TAKE 1 TABLET BY MOUTH  TWICE DAILY 180 tablet 3   Cholecalciferol (VITAMIN D) 2000 UNITS CAPS Take 2,000 Units by mouth daily.      cloNIDine (CATAPRES) 0.3 MG tablet TAKE 1 TABLET BY MOUTH  TWICE DAILY 180 tablet 3   dicyclomine (BENTYL) 20 MG tablet Take 1 tablet (20 mg total) by mouth every 6 (six) hours as needed for spasms (intestinal spasms). 30 tablet 0   febuxostat (ULORIC) 40 MG tablet TAKE 1 TABLET BY MOUTH  DAILY 90 tablet 3   fluticasone (FLONASE) 50 MCG/ACT nasal spray Place 2 sprays into both nostrils daily. 16 g 6   Fluticasone-Umeclidin-Vilant (TRELEGY ELLIPTA) 100-62.5-25 MCG/INH AEPB Inhale 1  Inhaler into the lungs daily. 1 each 11   furosemide (LASIX) 40 MG tablet TAKE 1 AND 1/2 TABLETS BY  MOUTH DAILY 135 tablet 3   gabapentin (NEURONTIN) 100 MG capsule TAKE 1 TO 2 CAPSULES BY  MOUTH AT BEDTIME FOR  PERIPHERAL NEUROPATHY (Patient taking differently: Take 100 mg by mouth See admin instructions. TAKE 1 TO 2 CAPSULES BY MOUTH AT BEDTIME FOR PERIPHERAL NEUROPATHY.) 180 capsule 3   glucose blood (ACCU-CHEK AVIVA PLUS) test strip CHECK BLOOD SUGAR 3 TIMES  DAILY BEFORE MEALS AND AT  BEDTIME. 400 strip 3   Glucose Blood (BLOOD GLUCOSE TEST STRIPS) STRP Please dispense as Accu-Chek Guide Me. Use as directed to monitor FSBS 3x daily. Dx: E11.9. 200 strip 3   guaiFENesin (MUCINEX) 600 MG 12 hr tablet Take 1 tablet (600 mg total) by mouth 2 (two) times daily. 30 tablet  1   HUMALOG KWIKPEN 100 UNIT/ML KwikPen INJECT SUBCUTANEOUSLY 7  UNITS 3 TIMES DAILY (Patient taking differently: Inject 7 Units into the skin 3 (three) times daily.) 30 mL 3   insulin detemir (LEVEMIR FLEXTOUCH) 100 UNIT/ML FlexPen Inject 20 Units into the skin at bedtime. SUBCUTANEOUSLY ONCE DAILY AT  10  PM 45 mL 3   Insulin Pen Needle (PEN NEEDLES) 31G X 6 MM MISC Use as directed to inject insulin SQ TID. 300 each 11   ipratropium-albuterol (DUONEB) 0.5-2.5 (3) MG/3ML SOLN Take 3 mLs by nebulization every 6 (six) hours as needed. 360 mL 5   linaclotide (LINZESS) 145 MCG CAPS capsule Take 1 capsule (145 mcg total) by mouth daily before breakfast. 90 capsule 3   omeprazole (PRILOSEC) 20 MG capsule TAKE 1 CAPSULE BY MOUTH  DAILY 90 capsule 3   polyethylene glycol (MIRALAX) packet Take 17 g by mouth daily. Hold for diarrhea. 28 each 1   simvastatin (ZOCOR) 20 MG tablet TAKE 1 TABLET BY MOUTH ONCE DAILY AT BEDTIME 90 tablet 3   XARELTO 15 MG TABS tablet TAKE 1 TABLET BY MOUTH  DAILY 90 tablet 3   No current facility-administered medications on file prior to visit.    Allergies  Allergen Reactions   Ace Inhibitors Other (See  Comments)    Hyperkalemia   Actos [Pioglitazone] Swelling   Aspirin Other (See Comments)    G.I. Upset. In high doses   Metformin And Related Diarrhea   Eliquis [Apixaban] Rash and Other (See Comments)    Patient states that this med was d/ced by prescriber due to interference with allergies and states that she also had a breakout   Social History   Socioeconomic History   Marital status: Widowed    Spouse name: Not on file   Number of children: Not on file   Years of education: Not on file   Highest education level: Not on file  Occupational History   Occupation: retired  Tobacco Use   Smoking status: Former    Packs/day: 0.50    Years: 50.00    Pack years: 25.00    Types: Cigarettes    Quit date: 01/19/2009    Years since quitting: 11.4   Smokeless tobacco: Never  Vaping Use   Vaping Use: Never used  Substance and Sexual Activity   Alcohol use: No    Alcohol/week: 0.0 standard drinks   Drug use: No   Sexual activity: Never  Other Topics Concern   Not on file  Social History Narrative   Entered 10/2013:   She has never driven.   She lives with her son.   Social Determinants of Health   Financial Resource Strain: Low Risk    Difficulty of Paying Living Expenses: Not hard at all  Food Insecurity: No Food Insecurity   Worried About Charity fundraiser in the Last Year: Never true   [place] in the Last Year: Never true  Transportation Needs: No Transportation Needs   Lack of Transportation (Medical): No   Lack of Transportation (Non-Medical): No  Physical Activity: Inactive   Days of Exercise per Week: 0 days   Minutes of Exercise per Session: 0 min  Stress: No Stress Concern Present   Feeling of Stress : Not at all  Social Connections: Socially Isolated   Frequency of Communication with Friends and Family: More than three times a week   Frequency of Social Gatherings with Friends and Family: Once a week  Attends Religious Services: Never   Active  Member of Clubs or Organizations: No   Attends Archivist Meetings: Never   Marital Status: Widowed  Human resources officer Violence: Not At Risk   Fear of Current or Ex-Partner: No   Emotionally Abused: No   Physically Abused: No   Sexually Abused: No      Review of Systems  All other systems reviewed and are negative.     Objective:   Physical Exam Vitals reviewed.  Constitutional:      General: She is not in acute distress.    Appearance: She is well-developed. She is not diaphoretic.  HENT:     Head: Normocephalic and atraumatic.  Neck:     Vascular: No JVD.  Cardiovascular:     Rate and Rhythm: Normal rate. Rhythm irregular.     Heart sounds: Normal heart sounds. No murmur heard.   No friction rub. No gallop.  Pulmonary:     Effort: Pulmonary effort is normal. No respiratory distress.     Breath sounds: Wheezing and rhonchi present. No rales.  Abdominal:     General: Bowel sounds are normal. There is no distension.     Palpations: Abdomen is soft. There is no mass.     Tenderness: There is no abdominal tenderness. There is no right CVA tenderness, left CVA tenderness, guarding or rebound.     Hernia: No hernia is present.  Musculoskeletal:     Cervical back: Neck supple.     Right lower leg: No edema.     Left lower leg: No edema.  Lymphadenopathy:     Cervical: No cervical adenopathy.          Assessment & Plan:  Chronic atrial fibrillation (HCC)  Chronic diastolic congestive heart failure ([place]) - Plan: CBC with Differential/Platelet, Brain natriuretic peptide, BASIC METABOLIC PANEL WITH GFR  COPD exacerbation (HCC) - Plan: CBC with Differential/Platelet, Brain natriuretic peptide, BASIC METABOLIC PANEL WITH GFR Her most recent A1c was 7.3 and well-controlled.  Her blood pressure today is excellent.  Her heart rate is well controlled.  I will repeat a CBC and a BMP.  However I believe her shortness of breath at the present time is more likely due to a  exacerbation of COPD.  She appears euvolemic today on exam.  I will get a baseline BNP today but I will start the patient on a prednisone taper pack.  Continue Trelegy and use albuterol as needed.  Recheck here in 1 week or sooner if worsening

## 2020-07-09 LAB — BASIC METABOLIC PANEL WITH GFR
BUN/Creatinine Ratio: 16 (calc) (ref 6–22)
BUN: 32 mg/dL — ABNORMAL HIGH (ref 7–25)
CO2: 30 mmol/L (ref 20–32)
Calcium: 8.9 mg/dL (ref 8.6–10.4)
Chloride: 103 mmol/L (ref 98–110)
Creat: 1.96 mg/dL — ABNORMAL HIGH (ref 0.60–0.93)
GFR, Est African American: 28 mL/min/{1.73_m2} — ABNORMAL LOW (ref >=60)
GFR, Est Non African American: 24 mL/min/{1.73_m2} — ABNORMAL LOW (ref >=60)
Glucose, Bld: 50 mg/dL — ABNORMAL LOW (ref 65–99)
Potassium: 3.9 mmol/L (ref 3.5–5.3)
Sodium: 140 mmol/L (ref 135–146)

## 2020-07-09 LAB — CBC WITH DIFFERENTIAL/PLATELET
Absolute Monocytes: 979 cells/uL — ABNORMAL HIGH (ref 200–950)
Basophils Absolute: 72 cells/uL (ref 0–200)
Basophils Relative: 1.5 %
Eosinophils Absolute: 163 cells/uL (ref 15–500)
Eosinophils Relative: 3.4 %
HCT: 36.3 % (ref 35.0–45.0)
Hemoglobin: 11.7 g/dL (ref 11.7–15.5)
Lymphs Abs: 998 cells/uL (ref 850–3900)
MCH: 27.6 pg (ref 27.0–33.0)
MCHC: 32.2 g/dL (ref 32.0–36.0)
MCV: 85.6 fL (ref 80.0–100.0)
MPV: 11.7 fL (ref 7.5–12.5)
Monocytes Relative: 20.4 %
Neutro Abs: 2587 cells/uL (ref 1500–7800)
Neutrophils Relative %: 53.9 %
Platelets: 228 10*3/uL (ref 140–400)
RBC: 4.24 10*6/uL (ref 3.80–5.10)
RDW: 14.3 % (ref 11.0–15.0)
Total Lymphocyte: 20.8 %
WBC: 4.8 10*3/uL (ref 3.8–10.8)

## 2020-07-09 LAB — BRAIN NATRIURETIC PEPTIDE: Brain Natriuretic Peptide: 222 pg/mL — ABNORMAL HIGH (ref <100)

## 2020-07-13 ENCOUNTER — Ambulatory Visit: Payer: Medicare Other | Admitting: Family Medicine

## 2020-07-15 ENCOUNTER — Emergency Department (HOSPITAL_COMMUNITY): Payer: Medicare Other

## 2020-07-15 ENCOUNTER — Emergency Department (HOSPITAL_COMMUNITY)
Admission: EM | Admit: 2020-07-15 | Discharge: 2020-07-16 | Disposition: A | Discharge status: Home / Self Care | Payer: Medicare Other | Attending: Emergency Medicine | Admitting: Emergency Medicine

## 2020-07-15 ENCOUNTER — Ambulatory Visit (INDEPENDENT_AMBULATORY_CARE_PROVIDER_SITE_OTHER): Payer: Medicare Other | Admitting: Family Medicine

## 2020-07-15 ENCOUNTER — Encounter: Payer: Self-pay | Admitting: Family Medicine

## 2020-07-15 ENCOUNTER — Encounter (HOSPITAL_COMMUNITY): Payer: Self-pay | Admitting: *Deleted

## 2020-07-15 ENCOUNTER — Other Ambulatory Visit: Payer: Self-pay

## 2020-07-15 VITALS — BP 130/68 | HR 76 | Temp 98.1°F | Resp 18 | Ht 63.0 in | Wt 171.0 lb

## 2020-07-15 DIAGNOSIS — E1122 Type 2 diabetes mellitus with diabetic chronic kidney disease: Secondary | ICD-10-CM | POA: Insufficient documentation

## 2020-07-15 DIAGNOSIS — E1142 Type 2 diabetes mellitus with diabetic polyneuropathy: Secondary | ICD-10-CM | POA: Diagnosis not present

## 2020-07-15 DIAGNOSIS — I5032 Chronic diastolic (congestive) heart failure: Secondary | ICD-10-CM | POA: Diagnosis not present

## 2020-07-15 DIAGNOSIS — R059 Cough, unspecified: Secondary | ICD-10-CM | POA: Diagnosis present

## 2020-07-15 DIAGNOSIS — Z794 Long term (current) use of insulin: Secondary | ICD-10-CM | POA: Diagnosis not present

## 2020-07-15 DIAGNOSIS — I4891 Unspecified atrial fibrillation: Secondary | ICD-10-CM | POA: Diagnosis not present

## 2020-07-15 DIAGNOSIS — Z7901 Long term (current) use of anticoagulants: Secondary | ICD-10-CM | POA: Insufficient documentation

## 2020-07-15 DIAGNOSIS — Z7951 Long term (current) use of inhaled steroids: Secondary | ICD-10-CM | POA: Diagnosis not present

## 2020-07-15 DIAGNOSIS — J441 Chronic obstructive pulmonary disease with (acute) exacerbation: Secondary | ICD-10-CM

## 2020-07-15 DIAGNOSIS — I2510 Atherosclerotic heart disease of native coronary artery without angina pectoris: Secondary | ICD-10-CM | POA: Diagnosis not present

## 2020-07-15 DIAGNOSIS — I509 Heart failure, unspecified: Secondary | ICD-10-CM | POA: Diagnosis not present

## 2020-07-15 DIAGNOSIS — Z79899 Other long term (current) drug therapy: Secondary | ICD-10-CM | POA: Diagnosis not present

## 2020-07-15 DIAGNOSIS — Z87891 Personal history of nicotine dependence: Secondary | ICD-10-CM | POA: Diagnosis not present

## 2020-07-15 DIAGNOSIS — I5033 Acute on chronic diastolic (congestive) heart failure: Secondary | ICD-10-CM | POA: Insufficient documentation

## 2020-07-15 DIAGNOSIS — I130 Hypertensive heart and chronic kidney disease with heart failure and stage 1 through stage 4 chronic kidney disease, or unspecified chronic kidney disease: Secondary | ICD-10-CM | POA: Diagnosis not present

## 2020-07-15 DIAGNOSIS — N183 Chronic kidney disease, stage 3 unspecified: Secondary | ICD-10-CM | POA: Insufficient documentation

## 2020-07-15 DIAGNOSIS — D631 Anemia in chronic kidney disease: Secondary | ICD-10-CM | POA: Diagnosis not present

## 2020-07-15 DIAGNOSIS — R0602 Shortness of breath: Secondary | ICD-10-CM | POA: Diagnosis not present

## 2020-07-15 LAB — COMPREHENSIVE METABOLIC PANEL
ALT: 12 U/L (ref 0–44)
AST: 11 U/L — ABNORMAL LOW (ref 15–41)
Albumin: 3.7 g/dL (ref 3.5–5.0)
Alkaline Phosphatase: 99 U/L (ref 38–126)
Anion gap: 9 (ref 5–15)
BUN: 42 mg/dL — ABNORMAL HIGH (ref 8–23)
CO2: 27 mmol/L (ref 22–32)
Calcium: 8.6 mg/dL — ABNORMAL LOW (ref 8.9–10.3)
Chloride: 102 mmol/L (ref 98–111)
Creatinine, Ser: 1.71 mg/dL — ABNORMAL HIGH (ref 0.44–1.00)
GFR, Estimated: 30 mL/min — ABNORMAL LOW (ref >60)
Glucose, Bld: 151 mg/dL — ABNORMAL HIGH (ref 70–99)
Potassium: 3.8 mmol/L (ref 3.5–5.1)
Sodium: 138 mmol/L (ref 135–145)
Total Bilirubin: 0.4 mg/dL (ref 0.3–1.2)
Total Protein: 6.9 g/dL (ref 6.5–8.1)

## 2020-07-15 LAB — CBC
HCT: 40.6 % (ref 36.0–46.0)
Hemoglobin: 12.8 g/dL (ref 12.0–15.0)
MCH: 27.8 pg (ref 26.0–34.0)
MCHC: 31.5 g/dL (ref 30.0–36.0)
MCV: 88.1 fL (ref 80.0–100.0)
Platelets: 274 10*3/uL (ref 150–400)
RBC: 4.61 MIL/uL (ref 3.87–5.11)
RDW: 15.1 % (ref 11.5–15.5)
WBC: 11.1 10*3/uL — ABNORMAL HIGH (ref 4.0–10.5)
nRBC: 0 % (ref 0.0–0.2)

## 2020-07-15 LAB — CBG MONITORING, ED: Glucose-Capillary: 156 mg/dL — ABNORMAL HIGH (ref 70–99)

## 2020-07-15 MED ORDER — PREDNISONE 20 MG PO TABS: 60.0000 mg | ORAL_TABLET | Freq: Every day | ORAL | 0 refills | Status: DC

## 2020-07-15 MED ORDER — ALBUTEROL SULFATE HFA 108 (90 BASE) MCG/ACT IN AERS: 2.0000 | INHALATION_SPRAY | Freq: Four times a day (QID) | RESPIRATORY_TRACT | 0 refills | Status: AC | PRN

## 2020-07-15 MED ORDER — IPRATROPIUM-ALBUTEROL 0.5-2.5 (3) MG/3ML IN SOLN
3.0000 mL | Freq: Once | RESPIRATORY_TRACT | Status: AC
  Administered 2020-07-15: 3 mL via RESPIRATORY_TRACT

## 2020-07-15 MED ORDER — LEVOFLOXACIN 500 MG PO TABS: 500.0000 mg | ORAL_TABLET | Freq: Every day | ORAL | 0 refills | Status: DC

## 2020-07-15 NOTE — ED Triage Notes (Signed)
Shortness of breath, referred by PCP

## 2020-07-15 NOTE — Discharge Instructions (Addendum)
I agree with Dr. Samella Parr assessment that you have a COPD exacerbation.  You also may have a little bit of fluid in your lungs contributing to your discomfort.  Increase your Lasix to 80 mg a day as he suggested.  Also get the prescriptions that he sent to your pharmacy, tomorrow morning and start taking them.  He has prescribed prednisone, Levaquin, and albuterol.  Use your albuterol inhaler 2 puffs every 4 hours as needed for cough or trouble breathing.  Make sure you are getting plenty of rest, and eating and drinking regularly.

## 2020-07-15 NOTE — Progress Notes (Signed)
Subjective:    Patient ID: Laura Best, female    DOB: November 14, 1940, 80 y.o.   MRN: [id number]  Admit date: 06/24/2020 Discharge date: 06/26/2020   Admitted From: Home Disposition: Home   Recommendations for Outpatient Follow-up:  Follow up with PCP in 1-2 weeks Please obtain BMP/CBC in one week Patient will be scheduled for cardiology follow-up   Discharge Condition: Stable CODE STATUS: Full code Diet recommendation: Heart healthy, carb modified   Brief/Interim Summary: 80 year old female with a history of atrial fibrillation, diastolic heart failure, COPD, admitted to the hospital with dizziness shortness of breath and chest pain.  She was noted to be hypoxic on room air on ambulation.  Patient was found to have decompensated CHF and was admitted for intravenous diuresis   Discharge Diagnoses:  Principal Problem:   Acute respiratory failure with hypoxia (HCC) Active Problems:   Type 2 diabetes with nephropathy (HCC)   CKD (chronic kidney disease) stage 3, GFR 30-59 ml/min (HCC)   Diastolic CHF, acute on chronic (HCC)   Atrial fibrillation, chronic   Acute on chronic diastolic (congestive) heart failure (HCC)   Acute respiratory failure with hypoxia -Patient noted to have oxygen saturation dropping down to 84% on room air with ambulation -Patient was weaned off of oxygen and is currently on room air -She was able to ambulate without difficulty and did not have any hypoxia   Acute on chronic diastolic congestive heart failure -Patient was treated with intravenous Lasix and had good urine output -Currently, appears to be euvolemic -Echocardiogram shows preserved ejection fraction -She reported that she was previously taking Lasix 60 mg daily, but recently was changed to 40 mg daily.  We will change home dose of Lasix back to 60 mg daily and have her follow-up with cardiology     Atrial fibrillation, chronic -Heart rate is currently stable -Continue rate control with  Coreg -She is anticoagulated with Xarelto   Diabetes -A1c 7.3 -Blood sugars currently stable -Continue basal insulin with sliding scale   CKD stage IIIb -Baseline creatinine around 1.3 -Creatinine had trended up to 1.7 with diuresis -She will need repeat basic metabolic panel in 1 week to ensure stability of renal function   Hypertension -Continued on Norvasc, clonidine and Coreg -Blood pressure currently stable   COPD -No wheezing at this time -Continue bronchodilators    07/08/20 Patient is here today for follow-up.  She is currently on Trelegy 1 inhalation a day however she also has samples of Incruse and Breo from the hospital.  I explained to the patient that this is redundant and that she does not need to be on both medications.  She states that she has plenty of Trelegy so she prefers to resume this.  However on exam, the patient is wheezing in all 4 lung fields.  She has diminished breath sounds bilaterally.  On her hospital discharge, it stated that she was not wheezing at all.  Therefore this is significantly worse.  She does report some shortness of breath.  Her weight is near her baseline.  The last time I saw the patient she was 175 pounds here in December.  She is 173 today.  She has trace bipedal edema but she does not appear fluid overloaded.  She is in atrial fibrillation today but her heart rate is well controlled.  At that time, my plan was:  Her most recent A1c was 7.3 and well-controlled.  Her blood pressure today is excellent.  Her heart rate is well  controlled.  I will repeat a CBC and a BMP.  However I believe her shortness of breath at the present time is more likely due to a exacerbation of COPD.  She appears euvolemic today on exam.  I will get a baseline BNP today but I will start the patient on a prednisone taper pack.  Continue Trelegy and use albuterol as needed.  Recheck here in 1 week or sooner if worsening  07/15/20 No visits with results within 1 Week(s)  from this visit.  Latest known visit with results is:  Office Visit on 07/08/2020  Component Date Value Ref Range Status   WBC 07/08/2020 4.8  3.8 - 10.8 Thousand/uL Final   RBC 07/08/2020 4.24  3.80 - 5.10 Million/uL Final   Hemoglobin 07/08/2020 11.7  11.7 - 15.5 g/dL Final   HCT 07/08/2020 36.3  35.0 - 45.0 % Final   MCV 07/08/2020 85.6  80.0 - 100.0 fL Final   MCH 07/08/2020 27.6  27.0 - 33.0 pg Final   MCHC 07/08/2020 32.2  32.0 - 36.0 g/dL Final   RDW 07/08/2020 14.3  11.0 - 15.0 % Final   Platelets 07/08/2020 228  140 - 400 Thousand/uL Final   MPV 07/08/2020 11.7  7.5 - 12.5 fL Final   Neutro Abs 07/08/2020 2,587  1,500 - 7,800 cells/uL Final   Lymphs Abs 07/08/2020 998  850 - 3,900 cells/uL Final   Absolute Monocytes 07/08/2020 979 (A) 200 - 950 cells/uL Final   Eosinophils Absolute 07/08/2020 163  15 - 500 cells/uL Final   Basophils Absolute 07/08/2020 72  0 - 200 cells/uL Final   Neutrophils Relative % 07/08/2020 53.9  % Final   Total Lymphocyte 07/08/2020 20.8  % Final   Monocytes Relative 07/08/2020 20.4  % Final   Eosinophils Relative 07/08/2020 3.4  % Final   Basophils Relative 07/08/2020 1.5  % Final   Brain Natriuretic Peptide 07/08/2020 222 (A) <100 pg/mL Final   Comment: . BNP levels increase with age in the general population with the highest values seen in individuals greater than 43 years of age. Reference: J. Am. [place] Ar. Cardiol. 2002; 16:384-536. Marland Kitchen    Glucose, Bld 07/08/2020 50 (A) 65 - 99 mg/dL Final   Comment: .            Fasting reference interval .    BUN 07/08/2020 32 (A) 7 - 25 mg/dL Final   Creat 07/08/2020 1.96 (A) 0.60 - 0.93 mg/dL Final   Comment: For patients >63 years of age, the reference limit for Creatinine is approximately 13% higher for people identified as African-American. .    GFR, Est Non African American 07/08/2020 24 (A) > OR = 60 mL/min/1.72m2 Final   GFR, Est African American 07/08/2020 28 (A) > OR = 60 mL/min/1.27m2 Final    BUN/Creatinine Ratio 07/08/2020 16  6 - 22 (calc) Final   Sodium 07/08/2020 140  135 - 146 mmol/L Final   Potassium 07/08/2020 3.9  3.5 - 5.3 mmol/L Final   Chloride 07/08/2020 103  98 - 110 mmol/L Final   CO2 07/08/2020 30  20 - 32 mmol/L Final   Calcium 07/08/2020 8.9  8.6 - 10.4 mg/dL Final   Wt Readings from Last 3 Encounters:  07/15/20 171 lb (77.6 kg)  07/08/20 173 lb (78.5 kg)  06/30/20 170 lb (77.1 kg)   Patient's weight is actually down from her last visit.  However she seems to be doing much worse today.  Despite losing 2 pounds since  her last visit, she has bibasilar crackles in both lungs.  She has rhonchorous breath sounds throughout.  She is also wheezing profusely in all 4 lung fields.  She reports increasing shortness of breath.  Oxygen is 92% on room air.  She has increased work of breathing but no tachypnea.  She is not in respiratory distress but she is clearly doing worse than she was at her last visit.  Apparently the patient decreased her Lasix to 40 mg a day.  She was not taking 60.  She is using Trelegy daily but she has not been using any albuterol. Past Medical History:  Diagnosis Date   Arthritis    "hands, feet" (02/17/2016)   Atrial fibrillation (HCC)    CAD (coronary artery disease)    a. 05/2016: cath showing nonobstructive CAD with 50% prox-Cx stenosis   CKD (chronic kidney disease) stage 3, GFR 30-59 ml/min (HCC) 12/25/2012   COPD (chronic obstructive pulmonary disease) (HCC)    Diastolic heart failure (HCC)    Hyperlipidemia    Hypertension    Osteoporosis    Pneumonia    "several times" (02/17/2016)   Small bowel obstruction (HCC) 02/17/2016   Tricuspid regurgitation    Echo 05/02/06-Nml LV. Mild MR. Mod TR   Type II diabetes mellitus ([place])    Vitamin D deficiency    Past Surgical History:  Procedure Laterality Date   APPENDECTOMY     HEART CHAMBER REVISION     patched hole --[facility] N/A 09/27/2013   Procedure:  APPENDECTOMY - CONVERTED FROM LAPAROSCOPIC;  Surgeon: Donnie Mesa, MD;  Location: [facility];  Service: General;  Laterality: N/A;   RIGHT/LEFT HEART CATH AND CORONARY ANGIOGRAPHY N/A 05/24/2016   Procedure: Right/Left Heart Cath and Coronary Angiography;  Surgeon: Larey Dresser, MD;  Location: [facility];  Service: Cardiovascular;  Laterality: N/A;   TUBAL LIGATION     Current Outpatient Medications on File Prior to Visit  Medication Sig Dispense Refill   ACCU-CHEK GUIDE test strip CHECK BLOOD SUGAR 3 TIMES  DAILY BEFORE MEALS AND AT  BEDTIME 400 strip 3   Accu-Chek Softclix Lancets lancets CHECK BLOOD SUGAR 3 TIMES  DAILY BEFORE MEALS AND AT  BEDTIME 400 each 3   acetaminophen (TYLENOL) 500 MG tablet Take 500 mg by mouth every 6 (six) hours as needed for pain.     amLODipine (NORVASC) 10 MG tablet TAKE ONE-HALF TABLET BY  MOUTH DAILY 45 tablet 3   Blood Glucose Monitoring Suppl (ACCU-CHEK GUIDE ME) w/Device KIT USE TO CHECK BLOOD SUGAR 4  TIMES DAILY BEFORE MEALS  AND AT BEDTIME 1 kit 0   carvedilol (COREG) 12.5 MG tablet TAKE 1 TABLET BY MOUTH  TWICE DAILY 180 tablet 3   Cholecalciferol (VITAMIN D) 2000 UNITS CAPS Take 2,000 Units by mouth daily.      cloNIDine (CATAPRES) 0.3 MG tablet TAKE 1 TABLET BY MOUTH  TWICE DAILY 180 tablet 3   dicyclomine (BENTYL) 20 MG tablet Take 1 tablet (20 mg total) by mouth every 6 (six) hours as needed for spasms (intestinal spasms). 30 tablet 0   febuxostat (ULORIC) 40 MG tablet TAKE 1 TABLET BY MOUTH  DAILY 90 tablet 3   fluticasone (FLONASE) 50 MCG/ACT nasal spray Place 2 sprays into both nostrils daily. 16 g 6   fluticasone furoate-vilanterol (BREO ELLIPTA) 100-25 MCG/INH AEPB Inhale 1 puff into the lungs daily.     Fluticasone-Umeclidin-Vilant (TRELEGY ELLIPTA) 100-62.5-25 MCG/INH AEPB Inhale  1 Inhaler into the lungs daily. 1 each 11   furosemide (LASIX) 40 MG tablet TAKE 1 AND 1/2 TABLETS BY  MOUTH DAILY (Patient taking differently: 40 mg.) 135  tablet 3   gabapentin (NEURONTIN) 100 MG capsule TAKE 1 TO 2 CAPSULES BY  MOUTH AT BEDTIME FOR  PERIPHERAL NEUROPATHY (Patient taking differently: Take 100 mg by mouth See admin instructions. TAKE 1 TO 2 CAPSULES BY MOUTH AT BEDTIME FOR PERIPHERAL NEUROPATHY.) 180 capsule 3   glucose blood (ACCU-CHEK AVIVA PLUS) test strip CHECK BLOOD SUGAR 3 TIMES  DAILY BEFORE MEALS AND AT  BEDTIME. 400 strip 3   Glucose Blood (BLOOD GLUCOSE TEST STRIPS) STRP Please dispense as Accu-Chek Guide Me. Use as directed to monitor FSBS 3x daily. Dx: E11.9. 200 strip 3   guaiFENesin (MUCINEX) 600 MG 12 hr tablet Take 1 tablet (600 mg total) by mouth 2 (two) times daily. 30 tablet 1   HUMALOG KWIKPEN 100 UNIT/ML KwikPen INJECT SUBCUTANEOUSLY 7  UNITS 3 TIMES DAILY (Patient taking differently: Inject 7 Units into the skin 3 (three) times daily.) 30 mL 3   insulin detemir (LEVEMIR FLEXTOUCH) 100 UNIT/ML FlexPen Inject 20 Units into the skin at bedtime. SUBCUTANEOUSLY ONCE DAILY AT  10  PM 45 mL 3   Insulin Pen Needle (PEN NEEDLES) 31G X 6 MM MISC Use as directed to inject insulin SQ TID. 300 each 11   ipratropium-albuterol (DUONEB) 0.5-2.5 (3) MG/3ML SOLN Take 3 mLs by nebulization every 6 (six) hours as needed. 360 mL 5   linaclotide (LINZESS) 145 MCG CAPS capsule Take 1 capsule (145 mcg total) by mouth daily before breakfast. 90 capsule 3   omeprazole (PRILOSEC) 20 MG capsule TAKE 1 CAPSULE BY MOUTH  DAILY 90 capsule 3   polyethylene glycol (MIRALAX) packet Take 17 g by mouth daily. Hold for diarrhea. 28 each 1   simvastatin (ZOCOR) 20 MG tablet TAKE 1 TABLET BY MOUTH ONCE DAILY AT BEDTIME 90 tablet 3   umeclidinium bromide (INCRUSE ELLIPTA) 62.5 MCG/INH AEPB Inhale 1 puff into the lungs daily.     XARELTO 15 MG TABS tablet TAKE 1 TABLET BY MOUTH  DAILY 90 tablet 3   No current facility-administered medications on file prior to visit.    Allergies  Allergen Reactions   Ace Inhibitors Other (See Comments)     Hyperkalemia   Actos [Pioglitazone] Swelling   Aspirin Other (See Comments)    G.I. Upset. In high doses   Metformin And Related Diarrhea   Eliquis [Apixaban] Rash and Other (See Comments)    Patient states that this med was d/ced by prescriber due to interference with allergies and states that she also had a breakout   Social History   Socioeconomic History   Marital status: Widowed    Spouse name: Not on file   Number of children: Not on file   Years of education: Not on file   Highest education level: Not on file  Occupational History   Occupation: retired  Tobacco Use   Smoking status: Former    Packs/day: 0.50    Years: 50.00    Pack years: 25.00    Types: Cigarettes    Quit date: 01/19/2009    Years since quitting: 11.4   Smokeless tobacco: Never  Vaping Use   Vaping Use: Never used  Substance and Sexual Activity   Alcohol use: No    Alcohol/week: 0.0 standard drinks   Drug use: No   Sexual activity: Never  Other Topics  Concern   Not on file  Social History Narrative   Entered 10/2013:   She has never driven.   She lives with her son.   Social Determinants of Health   Financial Resource Strain: Low Risk    Difficulty of Paying Living Expenses: Not hard at all  Food Insecurity: No Food Insecurity   Worried About Charity fundraiser in the Last Year: Never true   [place] in the Last Year: Never true  Transportation Needs: No Transportation Needs   Lack of Transportation (Medical): No   Lack of Transportation (Non-Medical): No  Physical Activity: Inactive   Days of Exercise per Week: 0 days   Minutes of Exercise per Session: 0 min  Stress: No Stress Concern Present   Feeling of Stress : Not at all  Social Connections: Socially Isolated   Frequency of Communication with Friends and Family: More than three times a week   Frequency of Social Gatherings with Friends and Family: Once a week   Attends Religious Services: Never   Marine scientist  or Organizations: No   Attends Archivist Meetings: Never   Marital Status: Widowed  Human resources officer Violence: Not At Risk   Fear of Current or Ex-Partner: No   Emotionally Abused: No   Physically Abused: No   Sexually Abused: No      Review of Systems  All other systems reviewed and are negative.     Objective:   Physical Exam Vitals reviewed.  Constitutional:      General: She is not in acute distress.    Appearance: She is well-developed. She is not diaphoretic.  HENT:     Head: Normocephalic and atraumatic.  Neck:     Vascular: No JVD.  Cardiovascular:     Rate and Rhythm: Normal rate. Rhythm irregular.     Heart sounds: Normal heart sounds. No murmur heard.   No friction rub. No gallop.  Pulmonary:     Effort: No tachypnea or respiratory distress. Accessory muscle usage: mild increase work of breathing.    Breath sounds: Decreased air movement present. Wheezing, rhonchi and rales present.  Chest:     Chest wall: No tenderness.  Abdominal:     General: Bowel sounds are normal. There is no distension.     Palpations: Abdomen is soft. There is no mass.     Tenderness: There is no abdominal tenderness. There is no right CVA tenderness, left CVA tenderness, guarding or rebound.     Hernia: No hernia is present.  Musculoskeletal:     Cervical back: Neck supple.     Right lower leg: No edema.     Left lower leg: No edema.  Lymphadenopathy:     Cervical: No cervical adenopathy.          Assessment & Plan:  Chronic diastolic congestive heart failure ([place]) - Plan: CBC with Differential/Platelet, COMPLETE METABOLIC PANEL WITH GFR, Brain natriuretic peptide  COPD exacerbation (HCC) - Plan: CBC with Differential/Platelet, COMPLETE METABOLIC PANEL WITH GFR, Brain natriuretic peptide I believe the patient is fluid overloaded based on her pulmonary exam.  I will increase her Lasix to 80 mg a day.  I also believe that she is audibly wheezing and has clear  evidence of COPD.  Therefore I will start her on prednisone 60 mg a day and I will have her start using albuterol 2 puffs inhaled every 6 hours.  I am concerned about hospital-acquired pneumonia given the bibasilar  Rales.  Without a chest x-ray its difficult to determine if this could be pulmonary edema or possible pneumonia.  Therefore we will also start the patient on Levaquin to cover for COPD exacerbation and possible hospital-acquired pneumonia until I have the results of the chest x-ray  However we gave the patient a DuoNeb.  After receiving the DuoNeb, the patient started to complain of some chest discomfort and tightness in her chest.  She does not feel comfortable going home.  We discussed the treatment strategy I mentioned above regarding increasing her diuretic and using prednisone with Levaquin.  However she feels like she should go to the emergency room.  She states that her breathing is steadily worsening.  Therefore we will notify the emergency room of her pending arrival.  She certainly has a COPD exacerbation.  Based on her exam today I am concerned about bilateral pneumonia versus pulmonary edema.

## 2020-07-15 NOTE — ED Provider Notes (Signed)
[facility] EMERGENCY DEPARTMENT Provider Note   CSN: [id number] Arrival date & time: 07/15/20  1734     History Chief Complaint  Patient presents with   Shortness of Breath    Laura Best is a 80 y.o. female.  HPI She presents, from her doctor's office for evaluation of cough, and chest discomfort.  This problem started a week ago and at that time she was treated with prednisone by her PCP.  She denies fever, chills, nausea or vomiting.  She is using her usual medication for COPD, without relief.  She came here by private vehicle.  There are no other known active modifying factors.  Note from patient's physician who saw her today in the office indicates: That he felt she was fluid overloaded and had a COPD exacerbation.  He plan to increase her Lasix to 80 mg a day, treat her with prednisone, albuterol and start Levaquin for COPD exacerbation.  Patient developed some chest discomfort, while she was getting a DuoNeb.  Patient elected to come to the emergency department for further evaluation and treatment.  Patient's physician sent prescriptions for albuterol, prednisone, and Levaquin to her pharmacy.    Past Medical History:  Diagnosis Date   Arthritis    "hands, feet" (02/17/2016)   Atrial fibrillation (HCC)    CAD (coronary artery disease)    a. 05/2016: cath showing nonobstructive CAD with 50% prox-Cx stenosis   CKD (chronic kidney disease) stage 3, GFR 30-59 ml/min (HCC) 12/25/2012   COPD (chronic obstructive pulmonary disease) (HCC)    Diastolic heart failure (HCC)    Hyperlipidemia    Hypertension    Osteoporosis    Pneumonia    "several times" (02/17/2016)   Small bowel obstruction (HCC) 02/17/2016   Tricuspid regurgitation    Echo 05/02/06-Nml LV. Mild MR. Mod TR   Type II diabetes mellitus ([place])    Vitamin D deficiency     Patient Active Problem List   Diagnosis Date Noted   Acute on chronic diastolic (congestive) heart failure ([place]) 06/25/2020   Atrial  fibrillation ([place])    Acute respiratory failure with hypoxia ([place]) 10/22/2017   Uncontrolled type 2 diabetes mellitus with hyperglycemia ([place]) 10/22/2017   Atrial fibrillation, chronic 10/22/2017   Acute on chronic respiratory failure ([place]) 10/22/2017   Persistent atrial fibrillation ([place])    Acute on chronic respiratory failure with hypoxia ([place]) 01/13/2017   Diabetic peripheral neuropathy associated with type 2 diabetes mellitus ([place]) 05/11/2016   PAH (pulmonary artery hypertension) ([place])    History of atrial septal defect repair    Leukocytosis 30/16/0109   Diastolic CHF, acute on chronic ([place]) 04/05/2016   SBO (small bowel obstruction) ([place]) 02/17/2016   Chronic diastolic congestive heart failure ([place]) 02/17/2016   Diabetes mellitus with complication ([place]) 32/35/5732   Pyuria 02/17/2016   GERD (gastroesophageal reflux disease) 04/05/2015   Acute diverticulitis 05/03/2014   Bradycardia 04/02/2013   Hyperkalemia 01/22/2013   Anemia of chronic disease 01/21/2013   Acute bronchitis 01/20/2013   CKD (chronic kidney disease) stage 3, GFR 30-59 ml/min ([place]) 12/25/2012   Screening for colorectal cancer 12/25/2012   Breast cancer screening 12/25/2012   Vitamin D deficiency    Osteoporosis    Postop check 08/13/2012   Appendiceal abscess 07/30/2012   Fever, unspecified 07/19/2012   Abdominal pain 07/19/2012   SOB (shortness of breath) 07/19/2012   ARF (acute renal failure) ([place]) 07/19/2012   Abscess, appendix 07/19/2012   Type 2 diabetes with nephropathy ([place]) 02/02/2011  Pulmonary hypertension ([place]) 02/02/2011   COPD exacerbation ([place]) 02/02/2011   Hypokalemia 02/02/2011   Normocytic anemia 02/02/2011   Essential hypertension    Hyperlipidemia     Past Surgical History:  Procedure Laterality Date   APPENDECTOMY     HEART CHAMBER REVISION     patched hole --[place] N/A 09/27/2013   Procedure: APPENDECTOMY - CONVERTED FROM LAPAROSCOPIC;   Surgeon: Donnie Mesa, MD;  Location: [place];  Service: General;  Laterality: N/A;   RIGHT/LEFT HEART CATH AND CORONARY ANGIOGRAPHY N/A 05/24/2016   Procedure: Right/Left Heart Cath and Coronary Angiography;  Surgeon: Larey Dresser, MD;  Location: [place] CV LAB;  Service: Cardiovascular;  Laterality: N/A;   TUBAL LIGATION       OB History   No obstetric history on file.     Family History  Problem Relation Age of Onset   Cancer Father    Diabetes Father    Cancer Brother        Brain   Cancer Sister        abdominal fat    Social History   Tobacco Use   Smoking status: Former    Packs/day: 0.50    Years: 50.00    Pack years: 25.00    Types: Cigarettes    Quit date: 01/19/2009    Years since quitting: 11.4   Smokeless tobacco: Never  Vaping Use   Vaping Use: Never used  Substance Use Topics   Alcohol use: No    Alcohol/week: 0.0 standard drinks   Drug use: No    Home Medications Prior to Admission medications   Medication Sig Start Date End Date Taking? Authorizing Provider  ACCU-CHEK GUIDE test strip CHECK BLOOD SUGAR 3 TIMES  DAILY BEFORE MEALS AND AT  BEDTIME 03/16/20   Susy Frizzle, MD  Accu-Chek Softclix Lancets lancets CHECK BLOOD SUGAR 3 TIMES  DAILY BEFORE MEALS AND AT  BEDTIME 12/08/19   Susy Frizzle, MD  acetaminophen (TYLENOL) 500 MG tablet Take 500 mg by mouth every 6 (six) hours as needed for pain.    [provider]  albuterol (VENTOLIN HFA) 108 (90 Base) MCG/ACT inhaler Inhale 2 puffs into the lungs every 6 (six) hours as needed for wheezing or shortness of breath. 07/15/20   Susy Frizzle, MD  amLODipine (NORVASC) 10 MG tablet TAKE ONE-HALF TABLET BY  MOUTH DAILY 10/20/19   Susy Frizzle, MD  Blood Glucose Monitoring Suppl (ACCU-CHEK GUIDE ME) w/Device KIT USE TO CHECK BLOOD SUGAR 4  TIMES DAILY BEFORE MEALS  AND AT BEDTIME 03/16/20   Susy Frizzle, MD  carvedilol (COREG) 12.5 MG tablet TAKE 1 TABLET BY MOUTH  TWICE DAILY  12/16/19   Susy Frizzle, MD  Cholecalciferol (VITAMIN D) 2000 UNITS CAPS Take 2,000 Units by mouth daily.     [provider]  cloNIDine (CATAPRES) 0.3 MG tablet TAKE 1 TABLET BY MOUTH  TWICE DAILY 12/16/19   Susy Frizzle, MD  dicyclomine (BENTYL) 20 MG tablet Take 1 tablet (20 mg total) by mouth every 6 (six) hours as needed for spasms (intestinal spasms). 05/09/19   Susy Frizzle, MD  febuxostat (ULORIC) 40 MG tablet TAKE 1 TABLET BY MOUTH  DAILY 12/16/19   Susy Frizzle, MD  fluticasone [facility]) 50 MCG/ACT nasal spray Place 2 sprays into both nostrils daily. 05/09/19   Susy Frizzle, MD  fluticasone furoate-vilanterol (BREO ELLIPTA) 100-25 MCG/INH AEPB Inhale 1 puff  into the lungs daily.    [provider]  Fluticasone-Umeclidin-Vilant (TRELEGY ELLIPTA) 100-62.5-25 MCG/INH AEPB Inhale 1 Inhaler into the lungs daily. 02/19/20   Alycia Rossetti, MD  furosemide (LASIX) 40 MG tablet TAKE 1 AND 1/2 TABLETS BY  MOUTH DAILY Patient taking differently: 40 mg. 12/16/19   Susy Frizzle, MD  gabapentin (NEURONTIN) 100 MG capsule TAKE 1 TO 2 CAPSULES BY  MOUTH AT BEDTIME FOR  PERIPHERAL NEUROPATHY Patient taking differently: Take 100 mg by mouth See admin instructions. TAKE 1 TO 2 CAPSULES BY MOUTH AT BEDTIME FOR PERIPHERAL NEUROPATHY. 12/16/19   Susy Frizzle, MD  glucose blood (ACCU-CHEK AVIVA PLUS) test strip CHECK BLOOD SUGAR 3 TIMES  DAILY BEFORE MEALS AND AT  BEDTIME. 12/08/19   Susy Frizzle, MD  Glucose Blood (BLOOD GLUCOSE TEST STRIPS) STRP Please dispense as Accu-Chek Guide Me. Use as directed to monitor FSBS 3x daily. Dx: E11.9. 12/08/19   Alycia Rossetti, MD  guaiFENesin (MUCINEX) 600 MG 12 hr tablet Take 1 tablet (600 mg total) by mouth 2 (two) times daily. 02/11/20   [place], Modena Nunnery, MD  HUMALOG KWIKPEN 100 UNIT/ML KwikPen INJECT SUBCUTANEOUSLY 7  UNITS 3 TIMES DAILY Patient taking differently: Inject 7 Units into the skin 3 (three) times  daily. 06/04/20   Susy Frizzle, MD  insulin detemir (LEVEMIR FLEXTOUCH) 100 UNIT/ML FlexPen Inject 20 Units into the skin at bedtime. SUBCUTANEOUSLY ONCE DAILY AT  10  PM 09/01/19   Susy Frizzle, MD  Insulin Pen Needle (PEN NEEDLES) 31G X 6 MM MISC Use as directed to inject insulin SQ TID. 06/20/19   Susy Frizzle, MD  ipratropium-albuterol (DUONEB) 0.5-2.5 (3) MG/3ML SOLN Take 3 mLs by nebulization every 6 (six) hours as needed. 10/29/17   Orlena Sheldon, PA-C  levofloxacin (LEVAQUIN) 500 MG tablet Take 1 tablet (500 mg total) by mouth daily for 7 days. 07/15/20 07/22/20  Susy Frizzle, MD  linaclotide [facility]) 145 MCG CAPS capsule Take 1 capsule (145 mcg total) by mouth daily before breakfast. 06/08/20   Susy Frizzle, MD  omeprazole (PRILOSEC) 20 MG capsule TAKE 1 CAPSULE BY MOUTH  DAILY 12/16/19   Susy Frizzle, MD  polyethylene glycol [facility]) packet Take 17 g by mouth daily. Hold for diarrhea. 10/24/17   Barton Dubois, MD  predniSONE (DELTASONE) 20 MG tablet Take 3 tablets (60 mg total) by mouth daily with breakfast. 07/15/20   Susy Frizzle, MD  simvastatin (ZOCOR) 20 MG tablet TAKE 1 TABLET BY MOUTH ONCE DAILY AT BEDTIME 12/16/19   Susy Frizzle, MD  umeclidinium bromide (INCRUSE ELLIPTA) 62.5 MCG/INH AEPB Inhale 1 puff into the lungs daily.    [provider]  XARELTO 15 MG TABS tablet TAKE 1 TABLET BY MOUTH  DAILY 12/16/19   Susy Frizzle, MD    EKG Interpretation  Date/Time:  Thursday July 15 2020 20:30:46 EDT Ventricular Rate:  69 PR Interval:    QRS Duration: 139 QT Interval:  445 QTC Calculation: 477 R Axis:   -88 Text Interpretation: Atrial fibrillation Nonspecific IVCD with LAD Anterior infarct, old Nonspecific T abnormalities, lateral leads Baseline wander in lead(s) V3 Since last tracing rate slower Otherwise no significant change Confirmed by Daleen Bo [phone]) on 07/15/2020 10:35:05 PM          Allergies    Ace inhibitors,  Actos [pioglitazone], Aspirin, Metformin and related, and Eliquis [apixaban]  Review of Systems   Review of Systems  All other systems reviewed and are negative.  Physical Exam Updated Vital Signs BP (!) 148/73   Pulse 66   Temp 98.2 F (36.8 C) (Oral)   Resp (!) 25   SpO2 93%   Physical Exam Vitals and nursing note reviewed.  Constitutional:      General: She is not in acute distress.    Appearance: She is well-developed. She is not ill-appearing.  HENT:     Head: Normocephalic and atraumatic.     Right Ear: External ear normal.     Left Ear: External ear normal.  Eyes:     Conjunctiva/sclera: Conjunctivae normal.     Pupils: Pupils are equal, round, and reactive to light.  Neck:     Trachea: Phonation normal.  Cardiovascular:     Rate and Rhythm: Normal rate and regular rhythm.     Heart sounds: Normal heart sounds.  Pulmonary:     Effort: Pulmonary effort is normal. No respiratory distress.     Breath sounds: No stridor. Rhonchi present. No wheezing or rales.  Chest:     Chest wall: No tenderness.  Abdominal:     Palpations: Abdomen is soft.     Tenderness: There is no abdominal tenderness.  Musculoskeletal:        General: Normal range of motion.     Cervical back: Normal range of motion and neck supple.     Right lower leg: No edema.     Left lower leg: No edema.  Skin:    General: Skin is warm and dry.  Neurological:     Mental Status: She is alert and oriented to person, place, and time.     Cranial Nerves: No cranial nerve deficit.     Sensory: No sensory deficit.     Motor: No abnormal muscle tone.     Coordination: Coordination normal.  Psychiatric:        Mood and Affect: Mood normal.        Behavior: Behavior normal.        Thought Content: Thought content normal.        Judgment: Judgment normal.    ED Results / Procedures / Treatments   Labs (all labs ordered are listed, but only abnormal results are displayed) Labs Reviewed  CBC -  Abnormal; Notable for the following components:      Result Value   WBC 11.1 (*)    All other components within normal limits  COMPREHENSIVE METABOLIC PANEL - Abnormal; Notable for the following components:   Glucose, Bld 151 (*)    BUN 42 (*)    Creatinine, Ser 1.71 (*)    Calcium 8.6 (*)    AST 11 (*)    GFR, Estimated 30 (*)    All other components within normal limits  CBG MONITORING, ED - Abnormal; Notable for the following components:   Glucose-Capillary 156 (*)    All other components within normal limits    EKG EKG Interpretation  Date/Time:  Thursday July 15 2020 20:30:46 EDT Ventricular Rate:  69 PR Interval:    QRS Duration: 139 QT Interval:  445 QTC Calculation: 477 R Axis:   -88 Text Interpretation: Atrial fibrillation Nonspecific IVCD with LAD Anterior infarct, old Nonspecific T abnormalities, lateral leads Baseline wander in lead(s) V3 Since last tracing rate slower Otherwise no significant change Confirmed by Daleen Bo [phone]) on 07/15/2020 10:35:05 PM  Radiology DG Chest 2 View  Result Date: 07/15/2020 CLINICAL DATA:  Shortness of breath for 1  week EXAM: CHEST - 2 VIEW COMPARISON:  06/24/2020 FINDINGS: Cardiac shadow is enlarged but stable. Postsurgical changes are again seen. Vascular congestion with mild interstitial edema is noted slightly improved from the prior exam. No sizable effusion is noted. No acute bony abnormality is seen. IMPRESSION: CHF but slightly improved when compared with the prior exam. Electronically Signed   By: Inez Catalina M.D.   On: 07/15/2020 21:03    Procedures Procedures   Medications Ordered in ED Medications - No data to display  ED Course  I have reviewed the triage vital signs and the nursing notes.  Pertinent labs & imaging results that were available during my care of the patient were reviewed by me and considered in my medical decision making (see chart for details).    MDM Rules/Calculators/A&P                            Patient Vitals for the past 24 hrs:  BP Temp Temp src Pulse Resp SpO2  07/15/20 2300 (!) 148/73 -- -- 66 (!) 25 93 %  07/15/20 2239 (!) 168/80 -- -- 63 (!) 21 94 %  07/15/20 2000 132/67 -- -- 70 20 94 %  07/15/20 1741 (!) 156/60 98.2 F (36.8 C) Oral 73 14 92 %    11:28 PM Reevaluation with update and discussion. After initial assessment and treatment, an updated evaluation reveals patient is fairly comfortable, oxygenation 92% on room air.  She states she has "just a little chest tightness."  Findings discussed with her and all questions were answered.  I advised her to continue the treatment recommended by her doctor when he saw her earlier today. Daleen Bo   Medical Decision Making:  This patient is presenting for evaluation of cough, which does require a range of treatment options, and is a complaint that involves a moderate risk of morbidity and mortality. The differential diagnoses include bronchitis, pneumonia, heart failure. I decided to review old records, and in summary elderly female with a history of high blood pressure, type 2 diabetes, chronic kidney disease, pulmonary hypertension and COPD.  He had cardiac echo done 3 weeks ago that showed normal ejection fraction and no diastolic dysfunction. I did not additional historical information from anyone.  Clinical Laboratory Tests Ordered, included CBC and Metabolic panel. Review indicates normal except glucose high, BUN high, creatinine high, calcium low, GFR low, white count high. Radiologic Tests Ordered, included chest x-ray.  I independently Visualized: Radiography images, which show mild CHF with cardiomegaly    Critical Interventions-clinical evaluation, laboratory testing, radiography, observation and reassessment  After These Interventions, the Patient was reevaluated and was found stable for discharge.  Patient with COPD exacerbation.  She may have an element of CHF however her recent echo cardiogram does  not indicate heart failure.  Her PCP is already addressed this and called in prescriptions to her pharmacy.  No indication for further ED evaluation or intervention.  Doubt pneumonia, sepsis, metabolic disorder or impending vascular collapse.  CRITICAL CARE-no Performed by: Daleen Bo  Nursing Notes Reviewed/ Care Coordinated Applicable Imaging Reviewed Interpretation of Laboratory Data incorporated into ED treatment  The patient appears reasonably screened and/or stabilized for discharge and I doubt any other medical condition or other [facility] requiring further screening, evaluation, or treatment in the ED at this time prior to discharge.  Plan: Home Medications-continue usual and recently prescribed medications; Home Treatments-gradually increase activity; return here if the recommended treatment, does  not improve the symptoms; Recommended follow up-PCP follow-up 1 week and as needed     Final Clinical Impression(s) / ED Diagnoses Final diagnoses:  COPD exacerbation ([place])    Rx / DC Orders ED Discharge Orders     None        Daleen Bo, MD 07/16/20 1105

## 2020-07-16 ENCOUNTER — Telehealth: Payer: Self-pay | Admitting: *Deleted

## 2020-07-16 ENCOUNTER — Ambulatory Visit: Payer: Self-pay | Admitting: Family Medicine

## 2020-07-16 MED ORDER — PREDNISONE 20 MG PO TABS: 40.0000 mg | ORAL_TABLET | Freq: Every day | ORAL | 0 refills | Status: DC

## 2020-07-16 MED ORDER — LEVOFLOXACIN 500 MG PO TABS: 500.0000 mg | ORAL_TABLET | Freq: Every day | ORAL | 0 refills | Status: AC

## 2020-07-16 NOTE — Telephone Encounter (Signed)
Call placed to patient and patient made aware.   Agreeable to plan.   Prescription sent to pharmacy.   Appointment scheduled.

## 2020-07-16 NOTE — Telephone Encounter (Signed)
-----   Message from Susy Frizzle, MD sent at 07/16/2020  7:03 AM EDT ----- They sent her home from er.  I want her to start prednisone 40 mg poqday for 7 days, up lasix to 80 mg poqday over the weekend, and add albuterol 2 puffs every 6 hours.  Add levaquin 500 mg poqday for 5 days and recheck here on Monday.

## 2020-07-19 ENCOUNTER — Ambulatory Visit (INDEPENDENT_AMBULATORY_CARE_PROVIDER_SITE_OTHER): Payer: Medicare Other | Admitting: Nurse Practitioner

## 2020-07-19 ENCOUNTER — Encounter: Payer: Self-pay | Admitting: Nurse Practitioner

## 2020-07-19 ENCOUNTER — Other Ambulatory Visit: Payer: Self-pay

## 2020-07-19 VITALS — BP 102/58 | HR 74 | Temp 98.4°F | Ht 63.0 in | Wt 164.8 lb

## 2020-07-19 DIAGNOSIS — N184 Chronic kidney disease, stage 4 (severe): Secondary | ICD-10-CM | POA: Diagnosis not present

## 2020-07-19 DIAGNOSIS — J441 Chronic obstructive pulmonary disease with (acute) exacerbation: Secondary | ICD-10-CM

## 2020-07-19 LAB — BASIC METABOLIC PANEL WITH GFR
BUN/Creatinine Ratio: 22 (calc) (ref 6–22)
BUN: 45 mg/dL — ABNORMAL HIGH (ref 7–25)
CO2: 28 mmol/L (ref 20–32)
Calcium: 8.9 mg/dL (ref 8.6–10.4)
Chloride: 100 mmol/L (ref 98–110)
Creat: 2.04 mg/dL — ABNORMAL HIGH (ref 0.60–0.93)
GFR, Est African American: 26 mL/min/{1.73_m2} — ABNORMAL LOW (ref >=60)
GFR, Est Non African American: 23 mL/min/{1.73_m2} — ABNORMAL LOW (ref >=60)
Glucose, Bld: 52 mg/dL — ABNORMAL LOW (ref 65–99)
Potassium: 4.2 mmol/L (ref 3.5–5.3)
Sodium: 140 mmol/L (ref 135–146)

## 2020-07-19 MED ORDER — GUAIFENESIN ER 600 MG PO TB12: 600.0000 mg | ORAL_TABLET | Freq: Two times a day (BID) | ORAL | 1 refills | Status: AC

## 2020-07-19 NOTE — Progress Notes (Signed)
Subjective:    Patient ID: Laura Best, female    DOB: March 13, 1940, 80 y.o.   MRN: [id number]  HPI: Laura Best is a 80 y.o. female presenting for ER follow up.  Chief Complaint  Patient presents with   ER FOLLOW UP    COPD follow up, using pred, inhaler, feeling weak, having high blood sugars since she's not feeling well.      COPD Patient reports her breathing feels good.  She reports she has been using her Duoneb every 6 hours, however she did not use it this morning.  She has been taking the Lasix 80 mg as instructed and has been urinating "a whole lot."  She reports her urine is very light colored.  She reports at home, her weight was 165.8 lbs.  She is taking the prednisone, levaquin, Lasix, and Duoneb as previously discussed.  COPD status: uncontrolled Satisfied with current treatment?: yes Oxygen use: no Dyspnea frequency: few time daily Cough frequency: few times daily, worse at night Rescue inhaler frequency:  every 6 hours Limitation of activity: yes Productive cough: yes - white phelgm Pneumovax: Up to Date Influenza: Up to Date  Allergies  Allergen Reactions   Ace Inhibitors Other (See Comments)    Hyperkalemia   Actos [Pioglitazone] Swelling   Aspirin Other (See Comments)    G.I. Upset. In high doses   Metformin And Related Diarrhea   Eliquis [Apixaban] Rash and Other (See Comments)    Patient states that this med was d/ced by prescriber due to interference with allergies and states that she also had a breakout    Outpatient Encounter Medications as of 07/19/2020  Medication Sig   ACCU-CHEK GUIDE test strip CHECK BLOOD SUGAR 3 TIMES  DAILY BEFORE MEALS AND AT  BEDTIME   Accu-Chek Softclix Lancets lancets CHECK BLOOD SUGAR 3 TIMES  DAILY BEFORE MEALS AND AT  BEDTIME   acetaminophen (TYLENOL) 500 MG tablet Take 500 mg by mouth every 6 (six) hours as needed for pain.   albuterol (VENTOLIN HFA) 108 (90 Base) MCG/ACT inhaler Inhale 2 puffs into the lungs  every 6 (six) hours as needed for wheezing or shortness of breath.   carvedilol (COREG) 12.5 MG tablet TAKE 1 TABLET BY MOUTH  TWICE DAILY   Cholecalciferol (VITAMIN D) 2000 UNITS CAPS Take 2,000 Units by mouth daily.    cloNIDine (CATAPRES) 0.3 MG tablet TAKE 1 TABLET BY MOUTH  TWICE DAILY   dicyclomine (BENTYL) 20 MG tablet Take 1 tablet (20 mg total) by mouth every 6 (six) hours as needed for spasms (intestinal spasms).   febuxostat (ULORIC) 40 MG tablet TAKE 1 TABLET BY MOUTH  DAILY   fluticasone (FLONASE) 50 MCG/ACT nasal spray Place 2 sprays into both nostrils daily.   fluticasone furoate-vilanterol (BREO ELLIPTA) 100-25 MCG/INH AEPB Inhale 1 puff into the lungs daily.   Fluticasone-Umeclidin-Vilant (TRELEGY ELLIPTA) 100-62.5-25 MCG/INH AEPB Inhale 1 Inhaler into the lungs daily.   furosemide (LASIX) 40 MG tablet TAKE 1 AND 1/2 TABLETS BY  MOUTH DAILY (Patient taking differently: 40 mg.)   gabapentin (NEURONTIN) 100 MG capsule TAKE 1 TO 2 CAPSULES BY  MOUTH AT BEDTIME FOR  PERIPHERAL NEUROPATHY (Patient taking differently: Take 100 mg by mouth See admin instructions. TAKE 1 TO 2 CAPSULES BY MOUTH AT BEDTIME FOR PERIPHERAL NEUROPATHY.)   glucose blood (ACCU-CHEK AVIVA PLUS) test strip CHECK BLOOD SUGAR 3 TIMES  DAILY BEFORE MEALS AND AT  BEDTIME.   Glucose Blood (BLOOD GLUCOSE TEST  STRIPS) STRP Please dispense as Accu-Chek Guide Me. Use as directed to monitor FSBS 3x daily. Dx: E11.9.   HUMALOG KWIKPEN 100 UNIT/ML KwikPen INJECT SUBCUTANEOUSLY 7  UNITS 3 TIMES DAILY (Patient taking differently: Inject 7 Units into the skin 3 (three) times daily.)   insulin detemir (LEVEMIR FLEXTOUCH) 100 UNIT/ML FlexPen Inject 20 Units into the skin at bedtime. SUBCUTANEOUSLY ONCE DAILY AT  10  PM   ipratropium-albuterol (DUONEB) 0.5-2.5 (3) MG/3ML SOLN Take 3 mLs by nebulization every 6 (six) hours as needed.   levofloxacin (LEVAQUIN) 500 MG tablet Take 1 tablet (500 mg total) by mouth daily for 5 days.    linaclotide (LINZESS) 145 MCG CAPS capsule Take 1 capsule (145 mcg total) by mouth daily before breakfast.   omeprazole (PRILOSEC) 20 MG capsule TAKE 1 CAPSULE BY MOUTH  DAILY   polyethylene glycol (MIRALAX) packet Take 17 g by mouth daily. Hold for diarrhea.   predniSONE (DELTASONE) 20 MG tablet Take 2 tablets (40 mg total) by mouth daily with breakfast for 7 days.   simvastatin (ZOCOR) 20 MG tablet TAKE 1 TABLET BY MOUTH ONCE DAILY AT BEDTIME   umeclidinium bromide (INCRUSE ELLIPTA) 62.5 MCG/INH AEPB Inhale 1 puff into the lungs daily.   XARELTO 15 MG TABS tablet TAKE 1 TABLET BY MOUTH  DAILY   [DISCONTINUED] amLODipine (NORVASC) 10 MG tablet TAKE ONE-HALF TABLET BY  MOUTH DAILY   [DISCONTINUED] Blood Glucose Monitoring Suppl (ACCU-CHEK GUIDE ME) w/Device KIT USE TO CHECK BLOOD SUGAR 4  TIMES DAILY BEFORE MEALS  AND AT BEDTIME   [DISCONTINUED] guaiFENesin (MUCINEX) 600 MG 12 hr tablet Take 1 tablet (600 mg total) by mouth 2 (two) times daily.   [DISCONTINUED] Insulin Pen Needle (PEN NEEDLES) 31G X 6 MM MISC Use as directed to inject insulin SQ TID.   guaiFENesin (MUCINEX) 600 MG 12 hr tablet Take 1 tablet (600 mg total) by mouth 2 (two) times daily.   No facility-administered encounter medications on file as of 07/19/2020.    Patient Active Problem List   Diagnosis Date Noted   CKD (chronic kidney disease) stage 4, GFR 15-29 ml/min ([place]) 07/21/2020   Acute on chronic diastolic (congestive) heart failure ([place]) 06/25/2020   Atrial fibrillation ([place])    Acute respiratory failure with hypoxia ([place]) 10/22/2017   Uncontrolled type 2 diabetes mellitus with hyperglycemia ([place]) 10/22/2017   Atrial fibrillation, chronic 10/22/2017   Acute on chronic respiratory failure ([place]) 10/22/2017   Persistent atrial fibrillation ([place])    Acute on chronic respiratory failure with hypoxia ([place]) 01/13/2017   Diabetic peripheral neuropathy associated with type 2 diabetes mellitus ([place]) 05/11/2016   PAH  (pulmonary artery hypertension) ([place])    History of atrial septal defect repair    Leukocytosis 95/97/4718   Diastolic CHF, acute on chronic ([place]) 04/05/2016   SBO (small bowel obstruction) ([place]) 02/17/2016   Chronic diastolic congestive heart failure ([place]) 02/17/2016   Diabetes mellitus with complication ([place]) 55/01/5866   Pyuria 02/17/2016   GERD (gastroesophageal reflux disease) 04/05/2015   Acute diverticulitis 05/03/2014   Bradycardia 04/02/2013   Hyperkalemia 01/22/2013   Anemia of chronic disease 01/21/2013   Acute bronchitis 01/20/2013   CKD (chronic kidney disease) stage 3, GFR 30-59 ml/min ([place]) 12/25/2012   Screening for colorectal cancer 12/25/2012   Breast cancer screening 12/25/2012   Vitamin D deficiency    Osteoporosis    Postop check 08/13/2012   Appendiceal abscess 07/30/2012   Fever, unspecified 07/19/2012   Abdominal pain 07/19/2012  SOB (shortness of breath) 07/19/2012   ARF (acute renal failure) ([place]) 07/19/2012   Abscess, appendix 07/19/2012   Type 2 diabetes with nephropathy ([place]) 02/02/2011   Pulmonary hypertension ([place]) 02/02/2011   COPD exacerbation ([place]) 02/02/2011   Hypokalemia 02/02/2011   Normocytic anemia 02/02/2011   Essential hypertension    Hyperlipidemia     Past Medical History:  Diagnosis Date   Arthritis    "hands, feet" (02/17/2016)   Atrial fibrillation (HCC)    CAD (coronary artery disease)    a. 05/2016: cath showing nonobstructive CAD with 50% prox-Cx stenosis   CKD (chronic kidney disease) stage 3, GFR 30-59 ml/min (HCC) 12/25/2012   COPD (chronic obstructive pulmonary disease) ([place])    Diastolic heart failure ([place])    Hyperlipidemia    Hypertension    Osteoporosis    Pneumonia    "several times" (02/17/2016)   Small bowel obstruction ([place]) 02/17/2016   Tricuspid regurgitation    Echo 05/02/06-Nml LV. Mild MR. Mod TR   Type II diabetes mellitus (HCC)    Vitamin D deficiency     Relevant past medical, surgical, family  and social history reviewed and updated as indicated. Interim medical history since our last visit reviewed.  Review of Systems  Constitutional:  Positive for activity change and fatigue. Negative for appetite change, chills, diaphoresis, fever and unexpected weight change.  HENT: Negative.    Respiratory:  Positive for cough, shortness of breath and wheezing. Negative for apnea and chest tightness.   Cardiovascular: Negative.  Negative for chest pain, palpitations and leg swelling.  Gastrointestinal: Negative.  Negative for diarrhea, nausea and vomiting.  Skin: Negative.  Negative for rash.  Neurological:  Positive for weakness. Negative for dizziness, light-headedness and headaches.  Psychiatric/Behavioral: Negative.  Negative for confusion. The patient is not nervous/anxious.   Per HPI unless specifically indicated above     Objective:    BP (!) 102/58   Pulse 74   Temp 98.4 F (36.9 C)   Ht _0  (1.6 m)   Wt 164 lb 12.8 oz (74.8 kg)   SpO2 94%   BMI 29.19 kg/m   Wt Readings from Last 3 Encounters:  07/19/20 164 lb 12.8 oz (74.8 kg)  07/15/20 171 lb (77.6 kg)  07/08/20 173 lb (78.5 kg)    Physical Exam Vitals and nursing note reviewed.  Constitutional:      General: She is not in acute distress.    Appearance: Normal appearance. She is not toxic-appearing.  Eyes:     General: No scleral icterus.    Extraocular Movements: Extraocular movements intact.  Cardiovascular:     Rate and Rhythm: Normal rate and regular rhythm.     Heart sounds: Normal heart sounds. No murmur heard. Pulmonary:     Effort: Pulmonary effort is normal. No respiratory distress.     Breath sounds: Wheezing and rhonchi present. No rales.  Chest:     Chest wall: No tenderness.  Abdominal:     General: Abdomen is flat. Bowel sounds are normal. There is no distension.     Palpations: Abdomen is soft.  Musculoskeletal:        General: Normal range of motion.     Cervical back: Normal range of  motion.     Right lower leg: No edema.     Left lower leg: No edema.  Lymphadenopathy:     Cervical: No cervical adenopathy.  Skin:    General: Skin is warm and dry.  Capillary Refill: Capillary refill takes less than 2 seconds.     Coloration: Skin is not jaundiced or pale.     Findings: No erythema.  Neurological:     Mental Status: She is alert and oriented to person, place, and time.     Motor: No weakness.     Gait: Gait normal.  Psychiatric:        Mood and Affect: Mood normal.        Behavior: Behavior normal.        Thought Content: Thought content normal.        Judgment: Judgment normal.      Assessment & Plan:   Problem List Items Addressed This Visit       Respiratory   COPD exacerbation ([place]) - Primary    Acute, improving.  Duoneb given in office today, patient tolerated well.  Oxygen saturation office today >92% on room air.  Continue Levaquin, prednisone to completion.  Continue Duoneb every 6 hours at home.  Start Mucinex to help with phlegm.  Encouraged deep breathing/use of flutter valve or incentive spirometer.  Will check kidney function today.  Plan to decrease Lasix to 60 mg daily pending lab work.  Patient appears to be euvolemic on examination.  Follow up later this week.  With any sudden onset of chest pain or shortness of breath in meantime, seek emergent care.       Relevant Medications   guaiFENesin (MUCINEX) 600 MG 12 hr tablet   Other Relevant Orders   BASIC METABOLIC PANEL WITH GFR (Completed)     Genitourinary   CKD (chronic kidney disease) stage 4, GFR 15-29 ml/min (HCC)    Chronic.  Coupled with diabetes, heart failure, COPD; need to maximize control of BP and blood sugar to prevent further worsening kidney function.  Lasix was recently increased for a short time to help with fluid overload - will recheck kidney function today and likely decrease Lasix to baseline which is 60 mg.  Follow up later this week for recheck.         Follow  up plan: Return in about 4 days (around 07/23/2020) for f/u.

## 2020-07-20 ENCOUNTER — Other Ambulatory Visit: Payer: Self-pay | Admitting: Family Medicine

## 2020-07-21 DIAGNOSIS — N184 Chronic kidney disease, stage 4 (severe): Secondary | ICD-10-CM | POA: Insufficient documentation

## 2020-07-21 NOTE — Assessment & Plan Note (Addendum)
Acute, improving.  Duoneb given in office today, patient tolerated well.  Oxygen saturation office today >92% on room air.  Continue Levaquin, prednisone to completion.  Continue Duoneb every 6 hours at home.  Start Mucinex to help with phlegm.  Encouraged deep breathing/use of flutter valve or incentive spirometer.  Will check kidney function today.  Plan to decrease Lasix to 60 mg daily pending lab work.  Patient appears to be euvolemic on examination.  Follow up later this week.  With any sudden onset of chest pain or shortness of breath in meantime, seek emergent care.

## 2020-07-21 NOTE — Assessment & Plan Note (Signed)
Chronic.  Coupled with diabetes, heart failure, COPD; need to maximize control of BP and blood sugar to prevent further worsening kidney function.  Lasix was recently increased for a short time to help with fluid overload - will recheck kidney function today and likely decrease Lasix to baseline which is 60 mg.  Follow up later this week for recheck.

## 2020-07-23 ENCOUNTER — Other Ambulatory Visit: Payer: Self-pay

## 2020-07-23 ENCOUNTER — Ambulatory Visit (INDEPENDENT_AMBULATORY_CARE_PROVIDER_SITE_OTHER): Payer: Medicare Other | Admitting: Nurse Practitioner

## 2020-07-23 VITALS — BP 108/76 | HR 73 | Temp 98.1°F | Ht 63.0 in | Wt 163.0 lb

## 2020-07-23 DIAGNOSIS — I5032 Chronic diastolic (congestive) heart failure: Secondary | ICD-10-CM

## 2020-07-23 DIAGNOSIS — J441 Chronic obstructive pulmonary disease with (acute) exacerbation: Secondary | ICD-10-CM | POA: Diagnosis not present

## 2020-07-23 NOTE — Progress Notes (Signed)
Subjective:    Patient ID: Laura Best, female    DOB: 07-21-1940, 80 y.o.   MRN: [id number]  HPI: Laura Best is a 80 y.o. female presenting for follow up.  Chief Complaint  Patient presents with   Follow-up    COPD, feeling weak and having some wheezing. Not much coughing, finished pred and antibiotic today. Some relief with chest congestion    Patient is here for follow up of her breathing.  She reports she is overall feeling better, not feeling as strong on her feet.  Uses a walker at home.  Staying with son and daughter in law.  Not interested in PT at this time because per her report, daughter in law does not like to have people at her house.  Also declines outpatient physical therapy due to transportation issues.   Reports her breathing is better.  She is taking her Duoneb as needed.  Taking furosemide 60 mg daily and reports she is still urinating quite a bit and her urine is clear.  Legs are not swollen today.  No chest pain or shortness of breath.  She has been eating well - ate oatmeal for breakfast and 1/2 sandwich for lunch.   Allergies  Allergen Reactions   Ace Inhibitors Other (See Comments)    Hyperkalemia   Actos [Pioglitazone] Swelling   Aspirin Other (See Comments)    G.I. Upset. In high doses   Metformin And Related Diarrhea   Eliquis [Apixaban] Rash and Other (See Comments)    Patient states that this med was d/ced by prescriber due to interference with allergies and states that she also had a breakout    Outpatient Encounter Medications as of 07/23/2020  Medication Sig   ACCU-CHEK GUIDE test strip CHECK BLOOD SUGAR 3 TIMES  DAILY BEFORE MEALS AND AT  BEDTIME   Accu-Chek Softclix Lancets lancets CHECK BLOOD SUGAR 3 TIMES  DAILY BEFORE MEALS AND AT  BEDTIME   acetaminophen (TYLENOL) 500 MG tablet Take 500 mg by mouth every 6 (six) hours as needed for pain.   albuterol (VENTOLIN HFA) 108 (90 Base) MCG/ACT inhaler Inhale 2 puffs into the lungs every 6 (six)  hours as needed for wheezing or shortness of breath.   amLODipine (NORVASC) 10 MG tablet TAKE ONE-HALF TABLET BY  MOUTH DAILY   Blood Glucose Monitoring Suppl (ACCU-CHEK GUIDE ME) w/Device KIT USE TO CHECK BLOOD SUGAR 4  TIMES DAILY BEFORE MEALS  AND AT BEDTIME   carvedilol (COREG) 12.5 MG tablet TAKE 1 TABLET BY MOUTH  TWICE DAILY   Cholecalciferol (VITAMIN D) 2000 UNITS CAPS Take 2,000 Units by mouth daily.    cloNIDine (CATAPRES) 0.3 MG tablet TAKE 1 TABLET BY MOUTH  TWICE DAILY   dicyclomine (BENTYL) 20 MG tablet Take 1 tablet (20 mg total) by mouth every 6 (six) hours as needed for spasms (intestinal spasms).   febuxostat (ULORIC) 40 MG tablet TAKE 1 TABLET BY MOUTH  DAILY   fluticasone (FLONASE) 50 MCG/ACT nasal spray Place 2 sprays into both nostrils daily.   fluticasone furoate-vilanterol (BREO ELLIPTA) 100-25 MCG/INH AEPB Inhale 1 puff into the lungs daily.   Fluticasone-Umeclidin-Vilant (TRELEGY ELLIPTA) 100-62.5-25 MCG/INH AEPB Inhale 1 Inhaler into the lungs daily.   furosemide (LASIX) 40 MG tablet TAKE 1 AND 1/2 TABLETS BY  MOUTH DAILY (Patient taking differently: 40 mg.)   gabapentin (NEURONTIN) 100 MG capsule TAKE 1 TO 2 CAPSULES BY  MOUTH AT BEDTIME FOR  PERIPHERAL NEUROPATHY (Patient taking differently:  Take 100 mg by mouth See admin instructions. TAKE 1 TO 2 CAPSULES BY MOUTH AT BEDTIME FOR PERIPHERAL NEUROPATHY.)   glucose blood (ACCU-CHEK AVIVA PLUS) test strip CHECK BLOOD SUGAR 3 TIMES  DAILY BEFORE MEALS AND AT  BEDTIME.   Glucose Blood (BLOOD GLUCOSE TEST STRIPS) STRP Please dispense as Accu-Chek Guide Me. Use as directed to monitor FSBS 3x daily. Dx: E11.9.   guaiFENesin (MUCINEX) 600 MG 12 hr tablet Take 1 tablet (600 mg total) by mouth 2 (two) times daily.   HUMALOG KWIKPEN 100 UNIT/ML KwikPen INJECT SUBCUTANEOUSLY 7  UNITS 3 TIMES DAILY (Patient taking differently: Inject 7 Units into the skin 3 (three) times daily.)   insulin detemir (LEVEMIR FLEXTOUCH) 100 UNIT/ML  FlexPen Inject 20 Units into the skin at bedtime. SUBCUTANEOUSLY ONCE DAILY AT  10  PM   Insulin Pen Needle (CLICKFINE PEN NEEDLES) 31G X 6 MM MISC USE AS DIRECTED TO INJECT  INSULIN SUBCUTANEOUSLY 3  TIMES DAILY   ipratropium-albuterol (DUONEB) 0.5-2.5 (3) MG/3ML SOLN Take 3 mLs by nebulization every 6 (six) hours as needed.   linaclotide (LINZESS) 145 MCG CAPS capsule Take 1 capsule (145 mcg total) by mouth daily before breakfast.   omeprazole (PRILOSEC) 20 MG capsule TAKE 1 CAPSULE BY MOUTH  DAILY   polyethylene glycol (MIRALAX) packet Take 17 g by mouth daily. Hold for diarrhea.   simvastatin (ZOCOR) 20 MG tablet TAKE 1 TABLET BY MOUTH ONCE DAILY AT BEDTIME   umeclidinium bromide (INCRUSE ELLIPTA) 62.5 MCG/INH AEPB Inhale 1 puff into the lungs daily.   XARELTO 15 MG TABS tablet TAKE 1 TABLET BY MOUTH  DAILY   [DISCONTINUED] predniSONE (DELTASONE) 20 MG tablet Take 2 tablets (40 mg total) by mouth daily with breakfast for 7 days.   No facility-administered encounter medications on file as of 07/23/2020.    Patient Active Problem List   Diagnosis Date Noted   CKD (chronic kidney disease) stage 4, GFR 15-29 ml/min (HCC) 07/21/2020   Acute on chronic diastolic (congestive) heart failure (HCC) 06/25/2020   Atrial fibrillation (HCC)    Acute respiratory failure with hypoxia (HCC) 10/22/2017   Uncontrolled type 2 diabetes mellitus with hyperglycemia (HCC) 10/22/2017   Atrial fibrillation, chronic 10/22/2017   Acute on chronic respiratory failure (HCC) 10/22/2017   Persistent atrial fibrillation (HCC)    Acute on chronic respiratory failure with hypoxia (HCC) 01/13/2017   Diabetic peripheral neuropathy associated with type 2 diabetes mellitus (HCC) 05/11/2016   PAH (pulmonary artery hypertension) (HCC)    History of atrial septal defect repair    Leukocytosis 04/06/2016   Diastolic CHF, acute on chronic (HCC) 04/05/2016   SBO (small bowel obstruction) (HCC) 02/17/2016   Chronic diastolic  congestive heart failure (HCC) 02/17/2016   Diabetes mellitus with complication (HCC) 02/17/2016   Pyuria 02/17/2016   GERD (gastroesophageal reflux disease) 04/05/2015   Acute diverticulitis 05/03/2014   Bradycardia 04/02/2013   Hyperkalemia 01/22/2013   Anemia of chronic disease 01/21/2013   Acute bronchitis 01/20/2013   CKD (chronic kidney disease) stage 3, GFR 30-59 ml/min (HCC) 12/25/2012   Screening for colorectal cancer 12/25/2012   Breast cancer screening 12/25/2012   Vitamin D deficiency    Osteoporosis    Postop check 08/13/2012   Appendiceal abscess 07/30/2012   Fever, unspecified 07/19/2012   Abdominal pain 07/19/2012   SOB (shortness of breath) 07/19/2012   ARF (acute renal failure) (HCC) 07/19/2012   Abscess, appendix 07/19/2012   Type 2 diabetes with nephropathy (HCC) 02/02/2011  Pulmonary hypertension ([place]) 02/02/2011   COPD exacerbation ([place]) 02/02/2011   Hypokalemia 02/02/2011   Normocytic anemia 02/02/2011   Essential hypertension    Hyperlipidemia     Past Medical History:  Diagnosis Date   Arthritis    "hands, feet" (02/17/2016)   Atrial fibrillation (HCC)    CAD (coronary artery disease)    a. 05/2016: cath showing nonobstructive CAD with 50% prox-Cx stenosis   CKD (chronic kidney disease) stage 3, GFR 30-59 ml/min (HCC) 12/25/2012   COPD (chronic obstructive pulmonary disease) (HCC)    Diastolic heart failure ([place])    Hyperlipidemia    Hypertension    Osteoporosis    Pneumonia    "several times" (02/17/2016)   Small bowel obstruction (HCC) 02/17/2016   Tricuspid regurgitation    Echo 05/02/06-Nml LV. Mild MR. Mod TR   Type II diabetes mellitus (HCC)    Vitamin D deficiency     Relevant past medical, surgical, family and social history reviewed and updated as indicated. Interim medical history since our last visit reviewed.  Review of Systems  Constitutional: Negative.  Negative for activity change, diaphoresis, fatigue and fever.   Respiratory: Negative.  Negative for cough, shortness of breath and wheezing.   Cardiovascular: Negative.  Negative for chest pain, palpitations and leg swelling.  Skin: Negative.   Neurological: Negative.   Psychiatric/Behavioral: Negative.     Per HPI unless specifically indicated above     Objective:    BP 108/76   Pulse 73   Temp 98.1 F (36.7 C)   Ht $R'5\' 3"'VZ$  (1.6 m)   Wt 163 lb (73.9 kg)   SpO2 93%   BMI 28.87 kg/m   Wt Readings from Last 3 Encounters:  07/23/20 163 lb (73.9 kg)  07/19/20 164 lb 12.8 oz (74.8 kg)  07/15/20 171 lb (77.6 kg)    Physical Exam Vitals and nursing note reviewed.  Constitutional:      General: She is not in acute distress.    Appearance: Normal appearance. She is not toxic-appearing.  HENT:     Head: Normocephalic and atraumatic.  Eyes:     General: No scleral icterus.    Extraocular Movements: Extraocular movements intact.  Cardiovascular:     Rate and Rhythm: Normal rate. Rhythm irregular.  Pulmonary:     Effort: Pulmonary effort is normal. No respiratory distress.     Breath sounds: Wheezing present. No rhonchi or rales.  Abdominal:     General: Abdomen is flat. Bowel sounds are normal. There is no distension.     Palpations: Abdomen is soft.  Skin:    General: Skin is warm and dry.     Coloration: Skin is not jaundiced or pale.     Findings: No erythema.  Neurological:     Mental Status: She is alert and oriented to person, place, and time.     Motor: No weakness.     Gait: Gait normal.  Psychiatric:        Mood and Affect: Mood normal.        Behavior: Behavior normal.        Thought Content: Thought content normal.        Judgment: Judgment normal.      Assessment & Plan:   Problem List Items Addressed This Visit       Cardiovascular and Mediastinum   Chronic diastolic congestive heart failure (HCC)    Weight is stable today and patient appears euvolemic on examination.  No other signs of  fluid overload.  Plan to  continue Lasix at 60 mg daily - which is her previously baseline and will recheck kidney function today.  Continue with moderate hydration, daily weight at home, limiting salt in diet.  Patient would likely benefit from PT, however declines today.  Follow up pending kidney function.         Respiratory   COPD exacerbation ([place]) - Primary    Breathing continues to improve.  She has completed her prednisone and antimicrobial therapy and reports her breathing feels back to baseline.  She does have some expiratory wheezing on examination today, however this is much improved.  Her oxygen saturation is excellent today.  Continue maintenence COPD regimen and follow up pending lab work.       Relevant Orders   BASIC METABOLIC PANEL WITH GFR (Completed)     Follow up plan: Return for pending lab work.

## 2020-07-24 LAB — BASIC METABOLIC PANEL WITHOUT GFR
BUN/Creatinine Ratio: 22 (calc) (ref 6–22)
BUN: 58 mg/dL — ABNORMAL HIGH (ref 7–25)
CO2: 25 mmol/L (ref 20–32)
Calcium: 8.9 mg/dL (ref 8.6–10.4)
Chloride: 98 mmol/L (ref 98–110)
Creat: 2.64 mg/dL — ABNORMAL HIGH (ref 0.60–0.93)
GFR, Est African American: 19 mL/min/1.73m2 — ABNORMAL LOW
GFR, Est Non African American: 17 mL/min/1.73m2 — ABNORMAL LOW
Glucose, Bld: 150 mg/dL — ABNORMAL HIGH (ref 65–99)
Potassium: 4.3 mmol/L (ref 3.5–5.3)
Sodium: 136 mmol/L (ref 135–146)

## 2020-07-26 ENCOUNTER — Encounter: Payer: Self-pay | Admitting: Nurse Practitioner

## 2020-07-26 NOTE — Assessment & Plan Note (Signed)
Weight is stable today and patient appears euvolemic on examination.  No other signs of fluid overload.  Plan to continue Lasix at 60 mg daily - which is her previously baseline and will recheck kidney function today.  Continue with moderate hydration, daily weight at home, limiting salt in diet.  Patient would likely benefit from PT, however declines today.  Follow up pending kidney function.

## 2020-07-26 NOTE — Assessment & Plan Note (Signed)
Breathing continues to improve.  She has completed her prednisone and antimicrobial therapy and reports her breathing feels back to baseline.  She does have some expiratory wheezing on examination today, however this is much improved.  Her oxygen saturation is excellent today.  Continue maintenence COPD regimen and follow up pending lab work.

## 2020-07-27 NOTE — Progress Notes (Signed)
Spoke with son regarding results

## 2020-07-27 NOTE — Progress Notes (Signed)
Spoke with the son of pt, will make sure mother does not take the lasix for 2 days and bring her to her appt for bmet lab

## 2020-07-29 ENCOUNTER — Ambulatory Visit: Payer: Medicare Other | Admitting: Student

## 2020-07-29 ENCOUNTER — Other Ambulatory Visit: Payer: Medicare Other

## 2020-07-29 ENCOUNTER — Other Ambulatory Visit: Payer: Self-pay

## 2020-07-29 DIAGNOSIS — I5032 Chronic diastolic (congestive) heart failure: Secondary | ICD-10-CM | POA: Diagnosis not present

## 2020-07-29 DIAGNOSIS — J441 Chronic obstructive pulmonary disease with (acute) exacerbation: Secondary | ICD-10-CM

## 2020-07-30 LAB — BASIC METABOLIC PANEL
BUN/Creatinine Ratio: 20 (calc) (ref 6–22)
BUN: 38 mg/dL — ABNORMAL HIGH (ref 7–25)
CO2: 28 mmol/L (ref 20–32)
Calcium: 8.4 mg/dL — ABNORMAL LOW (ref 8.6–10.4)
Chloride: 103 mmol/L (ref 98–110)
Creat: 1.86 mg/dL — ABNORMAL HIGH (ref 0.60–0.93)
Glucose, Bld: 158 mg/dL — ABNORMAL HIGH (ref 65–99)
Potassium: 4.2 mmol/L (ref 3.5–5.3)
Sodium: 139 mmol/L (ref 135–146)

## 2020-08-05 ENCOUNTER — Telehealth: Payer: Self-pay | Admitting: Family Medicine

## 2020-08-05 NOTE — Telephone Encounter (Signed)
Patient called to request lab results from recent tests. Please advise at [phone]

## 2020-08-05 NOTE — Telephone Encounter (Signed)
Please see labs for more information.

## 2020-08-06 ENCOUNTER — Encounter: Payer: Self-pay | Admitting: *Deleted

## 2020-08-10 DIAGNOSIS — Z20822 Contact with and (suspected) exposure to covid-19: Secondary | ICD-10-CM | POA: Diagnosis not present

## 2020-08-11 ENCOUNTER — Telehealth: Payer: Self-pay | Admitting: *Deleted

## 2020-08-11 NOTE — Telephone Encounter (Signed)
Received call from patient.   Reports that she is staying in [place] to care for brother as he was recently hospitalized for COVID.   States that she is having tightness in her chest today and is noted gasping while talking on the phone. States that she had just completed albuterol neb tx with no improvement.   Advised to go to ER for SOB.

## 2020-08-22 ENCOUNTER — Other Ambulatory Visit: Payer: Self-pay | Admitting: Family Medicine

## 2020-08-24 ENCOUNTER — Ambulatory Visit (INDEPENDENT_AMBULATORY_CARE_PROVIDER_SITE_OTHER): Payer: Medicare Other | Admitting: Family Medicine

## 2020-08-24 ENCOUNTER — Other Ambulatory Visit: Payer: Self-pay

## 2020-08-24 ENCOUNTER — Encounter: Payer: Self-pay | Admitting: Family Medicine

## 2020-08-24 VITALS — BP 144/82 | HR 72 | Temp 98.1°F | Resp 16 | Ht 63.0 in | Wt 180.0 lb

## 2020-08-24 DIAGNOSIS — E1165 Type 2 diabetes mellitus with hyperglycemia: Secondary | ICD-10-CM | POA: Diagnosis not present

## 2020-08-24 DIAGNOSIS — N184 Chronic kidney disease, stage 4 (severe): Secondary | ICD-10-CM

## 2020-08-24 DIAGNOSIS — I5033 Acute on chronic diastolic (congestive) heart failure: Secondary | ICD-10-CM | POA: Diagnosis not present

## 2020-08-24 DIAGNOSIS — E1122 Type 2 diabetes mellitus with diabetic chronic kidney disease: Secondary | ICD-10-CM

## 2020-08-24 DIAGNOSIS — I5032 Chronic diastolic (congestive) heart failure: Secondary | ICD-10-CM

## 2020-08-24 DIAGNOSIS — I482 Chronic atrial fibrillation, unspecified: Secondary | ICD-10-CM | POA: Diagnosis not present

## 2020-08-24 NOTE — Progress Notes (Signed)
Subjective:    Patient ID: Laura Best, female    DOB: 16-Oct-1940, 80 y.o.   MRN: [id number]  Patient is a very sweet 80 year old Caucasian female here today for follow-up.  When I last saw the patient she weighed approximately 173 pounds.  After discharge from the hospital her weight dropped to 163 pounds.  She is currently only taking 40 mg a day of Lasix.  Here today her weight is back up to 180 pounds.  She attributes some of the weight gain to the fact that she is constipated.  However today both of her legs are swollen.  The skin is tight and shiny on both legs with +1 pitting edema.  She also appears to have some crackles in both lungs.  Therefore I am concerned about diastolic heart failure.  She denies any chest pain or shortness of breath.  She is not wheezing on exam today and she is consistently using her Trelegy every day.  She is overdue to recheck her diabetes test.  Her last A1c was 7.3 but that was in December.  She is currently using 20 units of Levemir every day and then 3 to 7 units of Humalog 3 times a day with meals.  She denies any hypoglycemic episodes. Past Medical History:  Diagnosis Date   Arthritis    "hands, feet" (02/17/2016)   Atrial fibrillation (HCC)    CAD (coronary artery disease)    a. 05/2016: cath showing nonobstructive CAD with 50% prox-Cx stenosis   CKD (chronic kidney disease) stage 3, GFR 30-59 ml/min (HCC) 12/25/2012   COPD (chronic obstructive pulmonary disease) (HCC)    Diastolic heart failure (HCC)    Hyperlipidemia    Hypertension    Osteoporosis    Pneumonia    "several times" (02/17/2016)   Small bowel obstruction (HCC) 02/17/2016   Tricuspid regurgitation    Echo 05/02/06-Nml LV. Mild MR. Mod TR   Type II diabetes mellitus ([place])    Vitamin D deficiency    Past Surgical History:  Procedure Laterality Date   APPENDECTOMY     HEART CHAMBER REVISION     patched hole --[place] N/A 09/27/2013   Procedure:  APPENDECTOMY - CONVERTED FROM LAPAROSCOPIC;  Surgeon: Donnie Mesa, MD;  Location: [facility];  Service: General;  Laterality: N/A;   RIGHT/LEFT HEART CATH AND CORONARY ANGIOGRAPHY N/A 05/24/2016   Procedure: Right/Left Heart Cath and Coronary Angiography;  Surgeon: Larey Dresser, MD;  Location: [facility];  Service: Cardiovascular;  Laterality: N/A;   TUBAL LIGATION     Current Outpatient Medications on File Prior to Visit  Medication Sig Dispense Refill   ACCU-CHEK GUIDE test strip CHECK BLOOD SUGAR 3 TIMES  DAILY BEFORE MEALS AND AT  BEDTIME 400 strip 3   Accu-Chek Softclix Lancets lancets CHECK BLOOD SUGAR 3 TIMES  DAILY BEFORE MEALS AND AT  BEDTIME 400 each 3   acetaminophen (TYLENOL) 500 MG tablet Take 500 mg by mouth every 6 (six) hours as needed for pain.     albuterol (VENTOLIN HFA) 108 (90 Base) MCG/ACT inhaler Inhale 2 puffs into the lungs every 6 (six) hours as needed for wheezing or shortness of breath. 8 g 0   amLODipine (NORVASC) 10 MG tablet TAKE ONE-HALF TABLET BY  MOUTH DAILY 45 tablet 3   Blood Glucose Monitoring Suppl (ACCU-CHEK GUIDE ME) w/Device KIT USE TO CHECK BLOOD SUGAR 4  TIMES DAILY BEFORE MEALS  AND AT BEDTIME 1  kit 0   carvedilol (COREG) 12.5 MG tablet TAKE 1 TABLET BY MOUTH  TWICE DAILY 180 tablet 3   Cholecalciferol (VITAMIN D) 2000 UNITS CAPS Take 2,000 Units by mouth daily.      cloNIDine (CATAPRES) 0.3 MG tablet TAKE 1 TABLET BY MOUTH  TWICE DAILY 180 tablet 3   dicyclomine (BENTYL) 20 MG tablet Take 1 tablet (20 mg total) by mouth every 6 (six) hours as needed for spasms (intestinal spasms). 30 tablet 0   febuxostat (ULORIC) 40 MG tablet TAKE 1 TABLET BY MOUTH  DAILY 90 tablet 3   Fluticasone-Umeclidin-Vilant (TRELEGY ELLIPTA) 100-62.5-25 MCG/INH AEPB Inhale 1 Inhaler into the lungs daily. 1 each 11   furosemide (LASIX) 40 MG tablet TAKE 1 AND 1/2 TABLETS BY  MOUTH DAILY (Patient taking differently: 40 mg.) 135 tablet 3   gabapentin (NEURONTIN) 100 MG  capsule TAKE 1 TO 2 CAPSULES BY  MOUTH AT BEDTIME FOR  PERIPHERAL NEUROPATHY (Patient taking differently: Take 100 mg by mouth See admin instructions. TAKE 1 TO 2 CAPSULES BY MOUTH AT BEDTIME FOR PERIPHERAL NEUROPATHY.) 180 capsule 3   glucose blood (ACCU-CHEK AVIVA PLUS) test strip CHECK BLOOD SUGAR 3 TIMES  DAILY BEFORE MEALS AND AT  BEDTIME. 400 strip 3   Glucose Blood (BLOOD GLUCOSE TEST STRIPS) STRP Please dispense as Accu-Chek Guide Me. Use as directed to monitor FSBS 3x daily. Dx: E11.9. 200 strip 3   guaiFENesin (MUCINEX) 600 MG 12 hr tablet Take 1 tablet (600 mg total) by mouth 2 (two) times daily. 30 tablet 1   HUMALOG KWIKPEN 100 UNIT/ML KwikPen INJECT SUBCUTANEOUSLY 7  UNITS 3 TIMES DAILY (Patient taking differently: Inject 7 Units into the skin 3 (three) times daily.) 30 mL 3   Insulin Pen Needle (CLICKFINE PEN NEEDLES) 31G X 6 MM MISC USE AS DIRECTED TO INJECT  INSULIN SUBCUTANEOUSLY 3  TIMES DAILY 270 each 1   ipratropium-albuterol (DUONEB) 0.5-2.5 (3) MG/3ML SOLN Take 3 mLs by nebulization every 6 (six) hours as needed. 360 mL 5   LEVEMIR FLEXTOUCH 100 UNIT/ML FlexTouch Pen INJECT 20 UNITS  SUBCUTANEOUSLY ONCE DAILY  AT BEDTIME (10PM) 30 mL 3   linaclotide (LINZESS) 145 MCG CAPS capsule Take 1 capsule (145 mcg total) by mouth daily before breakfast. 90 capsule 3   omeprazole (PRILOSEC) 20 MG capsule TAKE 1 CAPSULE BY MOUTH  DAILY 90 capsule 3   polyethylene glycol (MIRALAX) packet Take 17 g by mouth daily. Hold for diarrhea. 28 each 1   simvastatin (ZOCOR) 20 MG tablet TAKE 1 TABLET BY MOUTH ONCE DAILY AT BEDTIME 90 tablet 3   XARELTO 15 MG TABS tablet TAKE 1 TABLET BY MOUTH  DAILY 90 tablet 3   No current facility-administered medications on file prior to visit.    Allergies  Allergen Reactions   Ace Inhibitors Other (See Comments)    Hyperkalemia   Actos [Pioglitazone] Swelling   Aspirin Other (See Comments)    G.I. Upset. In high doses   Metformin And Related Diarrhea    Eliquis [Apixaban] Rash and Other (See Comments)    Patient states that this med was d/ced by prescriber due to interference with allergies and states that she also had a breakout   Social History   Socioeconomic History   Marital status: Widowed    Spouse name: Not on file   Number of children: Not on file   Years of education: Not on file   Highest education level: Not on file  Occupational History  Occupation: retired  Tobacco Use   Smoking status: Former    Packs/day: 0.50    Years: 50.00    Pack years: 25.00    Types: Cigarettes    Quit date: 01/19/2009    Years since quitting: 11.6   Smokeless tobacco: Never  Vaping Use   Vaping Use: Never used  Substance and Sexual Activity   Alcohol use: No    Alcohol/week: 0.0 standard drinks   Drug use: No   Sexual activity: Never  Other Topics Concern   Not on file  Social History Narrative   Entered 10/2013:   She has never driven.   She lives with her son.   Social Determinants of Health   Financial Resource Strain: Low Risk    Difficulty of Paying Living Expenses: Not hard at all  Food Insecurity: No Food Insecurity   Worried About Charity fundraiser in the Last Year: Never true   [place] in the Last Year: Never true  Transportation Needs: No Transportation Needs   Lack of Transportation (Medical): No   Lack of Transportation (Non-Medical): No  Physical Activity: Inactive   Days of Exercise per Week: 0 days   Minutes of Exercise per Session: 0 min  Stress: No Stress Concern Present   Feeling of Stress : Not at all  Social Connections: Socially Isolated   Frequency of Communication with Friends and Family: More than three times a week   Frequency of Social Gatherings with Friends and Family: Once a week   Attends Religious Services: Never   Marine scientist or Organizations: No   Attends Archivist Meetings: Never   Marital Status: Widowed  Human resources officer Violence: Not At Risk   Fear  of Current or Ex-Partner: No   Emotionally Abused: No   Physically Abused: No   Sexually Abused: No      Review of Systems  All other systems reviewed and are negative.     Objective:   Physical Exam Vitals reviewed.  Constitutional:      General: She is not in acute distress.    Appearance: She is well-developed. She is not diaphoretic.  HENT:     Head: Normocephalic and atraumatic.  Neck:     Vascular: No JVD.  Cardiovascular:     Rate and Rhythm: Normal rate. Rhythm irregular.     Heart sounds: Normal heart sounds. No murmur heard.   No friction rub. No gallop.  Pulmonary:     Effort: No tachypnea, accessory muscle usage or respiratory distress.     Breath sounds: No decreased air movement. Rales present. No wheezing or rhonchi.  Chest:     Chest wall: No tenderness.  Abdominal:     General: Bowel sounds are normal. There is no distension.     Palpations: Abdomen is soft. There is no mass.     Tenderness: There is no abdominal tenderness. There is no right CVA tenderness, left CVA tenderness, guarding or rebound.     Hernia: No hernia is present.  Musculoskeletal:     Cervical back: Neck supple.     Right lower leg: Edema present.     Left lower leg: Edema present.  Lymphadenopathy:     Cervical: No cervical adenopathy.          Assessment & Plan:  Diabetes mellitus with stage 4 chronic kidney disease GFR 15-29 ([place]) - Plan: CBC with Differential/Platelet, COMPLETE METABOLIC PANEL WITH GFR, Hemoglobin H8E  Chronic diastolic  congestive heart failure (HCC)  Chronic atrial fibrillation (HCC)  Acute on chronic diastolic (congestive) heart failure ([place]) Patient appears fluid overloaded on exam today.  Increase Lasix to 60 mg a day up from 40 mg a day.  Check baseline CMP today and then I will have the patient come back in 2 weeks to recheck her weight and also to repeat a BMP.  I believe the patient needs to lose about 5 to 10 pounds of fluid gradually.  While  checking labs I will also repeat an A1c.  Goal A1c for this patient is less than 7.  Continue Trelegy.  She is seems to be at her baseline regarding her breathing today.

## 2020-08-25 LAB — CBC WITH DIFFERENTIAL/PLATELET
Absolute Monocytes: 889 cells/uL (ref 200–950)
Basophils Absolute: 91 cells/uL (ref 0–200)
Basophils Relative: 1.3 %
Eosinophils Absolute: 70 cells/uL (ref 15–500)
Eosinophils Relative: 1 %
HCT: 33.4 % — ABNORMAL LOW (ref 35.0–45.0)
Hemoglobin: 10.6 g/dL — ABNORMAL LOW (ref 11.7–15.5)
Lymphs Abs: 1645 cells/uL (ref 850–3900)
MCH: 27.1 pg (ref 27.0–33.0)
MCHC: 31.7 g/dL — ABNORMAL LOW (ref 32.0–36.0)
MCV: 85.4 fL (ref 80.0–100.0)
MPV: 11 fL (ref 7.5–12.5)
Monocytes Relative: 12.7 %
Neutro Abs: 4305 cells/uL (ref 1500–7800)
Neutrophils Relative %: 61.5 %
Platelets: 276 10*3/uL (ref 140–400)
RBC: 3.91 10*6/uL (ref 3.80–5.10)
RDW: 15.2 % — ABNORMAL HIGH (ref 11.0–15.0)
Total Lymphocyte: 23.5 %
WBC: 7 10*3/uL (ref 3.8–10.8)

## 2020-08-25 LAB — HEMOGLOBIN A1C
Hgb A1c MFr Bld: 7.2 % of total Hgb — ABNORMAL HIGH (ref <5.7)
Mean Plasma Glucose: 160 mg/dL
eAG (mmol/L): 8.9 mmol/L

## 2020-08-25 LAB — COMPLETE METABOLIC PANEL WITH GFR
AG Ratio: 1.4 (calc) (ref 1.0–2.5)
ALT: 5 U/L — ABNORMAL LOW (ref 6–29)
AST: 9 U/L — ABNORMAL LOW (ref 10–35)
Albumin: 3.4 g/dL — ABNORMAL LOW (ref 3.6–5.1)
Alkaline phosphatase (APISO): 104 U/L (ref 37–153)
BUN/Creatinine Ratio: 17 (calc) (ref 6–22)
BUN: 26 mg/dL — ABNORMAL HIGH (ref 7–25)
CO2: 26 mmol/L (ref 20–32)
Calcium: 8.2 mg/dL — ABNORMAL LOW (ref 8.6–10.4)
Chloride: 105 mmol/L (ref 98–110)
Creat: 1.56 mg/dL — ABNORMAL HIGH (ref 0.60–0.95)
Globulin: 2.5 g/dL (calc) (ref 1.9–3.7)
Glucose, Bld: 49 mg/dL — ABNORMAL LOW (ref 65–99)
Potassium: 3.9 mmol/L (ref 3.5–5.3)
Sodium: 142 mmol/L (ref 135–146)
Total Bilirubin: 0.3 mg/dL (ref 0.2–1.2)
Total Protein: 5.9 g/dL — ABNORMAL LOW (ref 6.1–8.1)
eGFR: 33 mL/min/{1.73_m2} — ABNORMAL LOW (ref >=60)

## 2020-08-25 LAB — SPECIMEN COMPROMISED

## 2020-09-02 ENCOUNTER — Ambulatory Visit: Payer: Medicare Other | Admitting: Student

## 2020-09-14 ENCOUNTER — Encounter: Payer: Self-pay | Admitting: Family Medicine

## 2020-09-14 ENCOUNTER — Other Ambulatory Visit: Payer: Self-pay

## 2020-09-14 ENCOUNTER — Ambulatory Visit (INDEPENDENT_AMBULATORY_CARE_PROVIDER_SITE_OTHER): Payer: Medicare Other | Admitting: Family Medicine

## 2020-09-14 VITALS — BP 128/78 | HR 67 | Ht 63.0 in | Wt 175.6 lb

## 2020-09-14 DIAGNOSIS — I5032 Chronic diastolic (congestive) heart failure: Secondary | ICD-10-CM

## 2020-09-14 NOTE — Progress Notes (Signed)
Subjective:    Patient ID: Laura Best, female    DOB: January 05, 1941, 80 y.o.   MRN: [id number]  08/24/20 Patient is a very sweet 80 year old Caucasian female here today for follow-up.  When I last saw the patient she weighed approximately 173 pounds.  After discharge from the hospital her weight dropped to 163 pounds.  She is currently only taking 40 mg a day of Lasix.  Here today her weight is back up to 180 pounds.  She attributes some of the weight gain to the fact that she is constipated.  However today both of her legs are swollen.  The skin is tight and shiny on both legs with +1 pitting edema.  She also appears to have some crackles in both lungs.  Therefore I am concerned about diastolic heart failure.  She denies any chest pain or shortness of breath.  She is not wheezing on exam today and she is consistently using her Trelegy every day.  She is overdue to recheck her diabetes test.  Her last A1c was 7.3 but that was in December.  She is currently using 20 units of Levemir every day and then 3 to 7 units of Humalog 3 times a day with meals.  She denies any hypoglycemic episodes.  At that time, my plan was:  Patient appears fluid overloaded on exam today.  Increase Lasix to 60 mg a day up from 40 mg a day.  Check baseline CMP today and then I will have the patient come back in 2 weeks to recheck her weight and also to repeat a BMP.  I believe the patient needs to lose about 5 to 10 pounds of fluid gradually.  While checking labs I will also repeat an A1c.  Goal A1c for this patient is less than 7.  Continue Trelegy.  She is seems to be at her baseline regarding her breathing today.  09/14/20 Wt Readings from Last 3 Encounters:  09/14/20 175 lb 9.6 oz (79.7 kg)  08/24/20 180 lb (81.6 kg)  07/23/20 163 lb (73.9 kg)   Patient has been doing an amazing job of tracking her weight.  According to her scales when I last saw her she was 179.8 pounds.  Over the last 2 to 3 weeks she has lost down to  174.6.  Therefore she is taken off about 5 pounds.  This matches up to what we are getting here on our scales.  The swelling in her legs is also improved as have the crackles in her lungs.  She states that her breathing is doing fine.  She is using the Trelegy every day.  She denies any shortness of breath wheezing or chest pain.  She is also been tracking her blood sugars.  Her fasting blood sugars are averaging between 95 and 129 consistently.  She has over 2 weeks worth of readings with not a single blood sugar outside of that range.  She denies any hypoglycemic episodes.  Her last A1c was acceptable given her age and medical comorbidities at 7.2 Past Medical History:  Diagnosis Date   Arthritis    "hands, feet" (02/17/2016)   Atrial fibrillation (HCC)    CAD (coronary artery disease)    a. 05/2016: cath showing nonobstructive CAD with 50% prox-Cx stenosis   CKD (chronic kidney disease) stage 3, GFR 30-59 ml/min ([place]) 12/25/2012   COPD (chronic obstructive pulmonary disease) (HCC)    Diastolic heart failure (HCC)    Hyperlipidemia    Hypertension  Osteoporosis    Pneumonia    "several times" (02/17/2016)   Small bowel obstruction (HCC) 02/17/2016   Tricuspid regurgitation    Echo 05/02/06-Nml LV. Mild MR. Mod TR   Type II diabetes mellitus (HCC)    Vitamin D deficiency    Past Surgical History:  Procedure Laterality Date   APPENDECTOMY     HEART CHAMBER REVISION     patched hole --1942 & 1993   LAPAROSCOPIC APPENDECTOMY N/A 09/27/2013   Procedure: APPENDECTOMY - CONVERTED FROM LAPAROSCOPIC;  Surgeon: Matthew Tsuei, MD;  Location: [facility];  Service: General;  Laterality: N/A;   RIGHT/LEFT HEART CATH AND CORONARY ANGIOGRAPHY N/A 05/24/2016   Procedure: Right/Left Heart Cath and Coronary Angiography;  Surgeon: Dalton S McLean, MD;  Location: [facility];  Service: Cardiovascular;  Laterality: N/A;   TUBAL LIGATION     Current Outpatient Medications on File Prior to Visit  Medication  Sig Dispense Refill   ACCU-CHEK GUIDE test strip CHECK BLOOD SUGAR 3 TIMES  DAILY BEFORE MEALS AND AT  BEDTIME 400 strip 3   Accu-Chek Softclix Lancets lancets CHECK BLOOD SUGAR 3 TIMES  DAILY BEFORE MEALS AND AT  BEDTIME 400 each 3   acetaminophen (TYLENOL) 500 MG tablet Take 500 mg by mouth every 6 (six) hours as needed for pain.     albuterol (VENTOLIN HFA) 108 (90 Base) MCG/ACT inhaler Inhale 2 puffs into the lungs every 6 (six) hours as needed for wheezing or shortness of breath. 8 g 0   amLODipine (NORVASC) 10 MG tablet TAKE ONE-HALF TABLET BY  MOUTH DAILY 45 tablet 3   Blood Glucose Monitoring Suppl (ACCU-CHEK GUIDE ME) w/Device KIT USE TO CHECK BLOOD SUGAR 4  TIMES DAILY BEFORE MEALS  AND AT BEDTIME 1 kit 0   carvedilol (COREG) 12.5 MG tablet TAKE 1 TABLET BY MOUTH  TWICE DAILY 180 tablet 3   Cholecalciferol (VITAMIN D) 2000 UNITS CAPS Take 2,000 Units by mouth daily.      cloNIDine (CATAPRES) 0.3 MG tablet TAKE 1 TABLET BY MOUTH  TWICE DAILY 180 tablet 3   dicyclomine (BENTYL) 20 MG tablet Take 1 tablet (20 mg total) by mouth every 6 (six) hours as needed for spasms (intestinal spasms). 30 tablet 0   febuxostat (ULORIC) 40 MG tablet TAKE 1 TABLET BY MOUTH  DAILY 90 tablet 3   Fluticasone-Umeclidin-Vilant (TRELEGY ELLIPTA) 100-62.5-25 MCG/INH AEPB Inhale 1 Inhaler into the lungs daily. 1 each 11   furosemide (LASIX) 40 MG tablet TAKE 1 AND 1/2 TABLETS BY  MOUTH DAILY (Patient taking differently: 40 mg.) 135 tablet 3   gabapentin (NEURONTIN) 100 MG capsule TAKE 1 TO 2 CAPSULES BY  MOUTH AT BEDTIME FOR  PERIPHERAL NEUROPATHY (Patient taking differently: Take 100 mg by mouth See admin instructions. TAKE 1 TO 2 CAPSULES BY MOUTH AT BEDTIME FOR PERIPHERAL NEUROPATHY.) 180 capsule 3   glucose blood (ACCU-CHEK AVIVA PLUS) test strip CHECK BLOOD SUGAR 3 TIMES  DAILY BEFORE MEALS AND AT  BEDTIME. 400 strip 3   Glucose Blood (BLOOD GLUCOSE TEST STRIPS) STRP Please dispense as Accu-Chek Guide Me. Use as  directed to monitor FSBS 3x daily. Dx: E11.9. 200 strip 3   guaiFENesin (MUCINEX) 600 MG 12 hr tablet Take 1 tablet (600 mg total) by mouth 2 (two) times daily. 30 tablet 1   HUMALOG KWIKPEN 100 UNIT/ML KwikPen INJECT SUBCUTANEOUSLY 7  UNITS 3 TIMES DAILY (Patient taking differently: Inject 7 Units into the skin 3 (three) times daily.) 30 mL   3   Insulin Pen Needle (CLICKFINE PEN NEEDLES) 31G X 6 MM MISC USE AS DIRECTED TO INJECT  INSULIN SUBCUTANEOUSLY 3  TIMES DAILY 270 each 1   ipratropium-albuterol (DUONEB) 0.5-2.5 (3) MG/3ML SOLN Take 3 mLs by nebulization every 6 (six) hours as needed. 360 mL 5   LEVEMIR FLEXTOUCH 100 UNIT/ML FlexTouch Pen INJECT 20 UNITS  SUBCUTANEOUSLY ONCE DAILY  AT BEDTIME (10PM) 30 mL 3   linaclotide (LINZESS) 145 MCG CAPS capsule Take 1 capsule (145 mcg total) by mouth daily before breakfast. 90 capsule 3   omeprazole (PRILOSEC) 20 MG capsule TAKE 1 CAPSULE BY MOUTH  DAILY 90 capsule 3   polyethylene glycol (MIRALAX) packet Take 17 g by mouth daily. Hold for diarrhea. 28 each 1   simvastatin (ZOCOR) 20 MG tablet TAKE 1 TABLET BY MOUTH ONCE DAILY AT BEDTIME 90 tablet 3   XARELTO 15 MG TABS tablet TAKE 1 TABLET BY MOUTH  DAILY 90 tablet 3   No current facility-administered medications on file prior to visit.    Allergies  Allergen Reactions   Ace Inhibitors Other (See Comments)    Hyperkalemia   Actos [Pioglitazone] Swelling   Aspirin Other (See Comments)    G.I. Upset. In high doses   Metformin And Related Diarrhea   Eliquis [Apixaban] Rash and Other (See Comments)    Patient states that this med was d/ced by prescriber due to interference with allergies and states that she also had a breakout   Social History   Socioeconomic History   Marital status: Widowed    Spouse name: Not on file   Number of children: Not on file   Years of education: Not on file   Highest education level: Not on file  Occupational History   Occupation: retired  Tobacco Use    Smoking status: Former    Packs/day: 0.50    Years: 50.00    Pack years: 25.00    Types: Cigarettes    Quit date: 01/19/2009    Years since quitting: 11.6   Smokeless tobacco: Never  Vaping Use   Vaping Use: Never used  Substance and Sexual Activity   Alcohol use: No    Alcohol/week: 0.0 standard drinks   Drug use: No   Sexual activity: Never  Other Topics Concern   Not on file  Social History Narrative   Entered 10/2013:   She has never driven.   She lives with her son.   Social Determinants of Health   Financial Resource Strain: Low Risk    Difficulty of Paying Living Expenses: Not hard at all  Food Insecurity: No Food Insecurity   Worried About Running Out of Food in the Last Year: Never true   Ran Out of Food in the Last Year: Never true  Transportation Needs: No Transportation Needs   Lack of Transportation (Medical): No   Lack of Transportation (Non-Medical): No  Physical Activity: Inactive   Days of Exercise per Week: 0 days   Minutes of Exercise per Session: 0 min  Stress: No Stress Concern Present   Feeling of Stress : Not at all  Social Connections: Socially Isolated   Frequency of Communication with Friends and Family: More than three times a week   Frequency of Social Gatherings with Friends and Family: Once a week   Attends Religious Services: Never   Active Member of Clubs or Organizations: No   Attends Club or Organization Meetings: Never   Marital Status: Widowed  Intimate Partner Violence: Not   At Risk   Fear of Current or Ex-Partner: No   Emotionally Abused: No   Physically Abused: No   Sexually Abused: No      Review of Systems  All other systems reviewed and are negative.     Objective:   Physical Exam Vitals reviewed.  Constitutional:      General: She is not in acute distress.    Appearance: She is well-developed. She is not diaphoretic.  HENT:     Head: Normocephalic and atraumatic.  Neck:     Vascular: No JVD.  Cardiovascular:      Rate and Rhythm: Normal rate. Rhythm irregular.     Heart sounds: Normal heart sounds. No murmur heard.   No friction rub. No gallop.  Pulmonary:     Effort: No tachypnea, accessory muscle usage or respiratory distress.     Breath sounds: No decreased air movement. Rales present. No wheezing or rhonchi.  Chest:     Chest wall: No tenderness.  Abdominal:     General: Bowel sounds are normal. There is no distension.     Palpations: Abdomen is soft. There is no mass.     Tenderness: There is no abdominal tenderness. There is no right CVA tenderness, left CVA tenderness, guarding or rebound.     Hernia: No hernia is present.  Musculoskeletal:     Cervical back: Neck supple.     Right lower leg: Edema present.     Left lower leg: Edema present.  Lymphadenopathy:     Cervical: No cervical adenopathy.          Assessment & Plan:  Chronic diastolic congestive heart failure ([place]) - Plan: BASIC METABOLIC PANEL WITH GFR I believe the patient's edema and fluid overload have improved significantly on the higher dose of Lasix.  I believe that this is a better chronic dose for her so I will keep her on 60 mg a day.  I will recheck a BMP today to monitor her renal function and potassium.  If her creatinine is between 1.6 and 1.7 with a normal potassium I would not make any changes in her diuretic dose.  I am happy with her blood sugars.  We will make no changes and reassess in 3 months assuming the blood work is normal

## 2020-09-15 LAB — BASIC METABOLIC PANEL WITH GFR
BUN/Creatinine Ratio: 16 (calc) (ref 6–22)
BUN: 29 mg/dL — ABNORMAL HIGH (ref 7–25)
CO2: 25 mmol/L (ref 20–32)
Calcium: 9 mg/dL (ref 8.6–10.4)
Chloride: 106 mmol/L (ref 98–110)
Creat: 1.78 mg/dL — ABNORMAL HIGH (ref 0.60–0.95)
Glucose, Bld: 96 mg/dL (ref 65–99)
Potassium: 4.1 mmol/L (ref 3.5–5.3)
Sodium: 144 mmol/L (ref 135–146)
eGFR: 29 mL/min/{1.73_m2} — ABNORMAL LOW (ref >=60)

## 2020-09-16 ENCOUNTER — Encounter: Payer: Self-pay | Admitting: *Deleted

## 2020-10-08 ENCOUNTER — Telehealth: Payer: Self-pay | Admitting: Family Medicine

## 2020-10-08 ENCOUNTER — Telehealth: Payer: Self-pay | Admitting: Pharmacist

## 2020-10-08 MED ORDER — ACCU-CHEK AVIVA PLUS W/DEVICE KIT: PACK | 1 refills | Status: AC

## 2020-10-08 NOTE — Telephone Encounter (Signed)
Prescription sent to pharmacy.

## 2020-10-08 NOTE — Telephone Encounter (Signed)
Patient called to request Accu-pack aviva plus; stated she has plenty of strips but doesn't have the meter. Insurance company awaiting provider's call.  Patient also wants to know when she's due for another appointment.  Please advise at [phone]

## 2020-10-08 NOTE — Progress Notes (Addendum)
Chronic Care Management Pharmacy Assistant   Name: Laura Best  MRN: [id number] DOB: 08-22-1940  Reason for Encounter: Disease State For HTN.    Conditions to be addressed/monitored: Hypertension, CHF, Afib, COPD, GERD, Type II DM, CKD, osteoporosis, hyperlipidemia.  Recent office visits: 09/14/20 Dr. Dennard Schaumann For follow-up. No medication changes. 08/24/20 Dr. Dennard Schaumann For follow-up. Per note: Increase Lasix to 60 mg a day up from 40 mg a day 07/23/20 Eulogio Bear, NP. For COPD/Follow-up. Per note: Plan to continue Lasix at 60 mg daily.  07/19/20 Eulogio Bear, NP. For COPD/ER Follow-up. Per note: Start Mucinex to help with phlegm,Plan to decrease Lasix to 60 mg daily pending lab work.  07/16/20 (Telephone) Dr. Dennard Schaumann Per note: I want her to start prednisone 40 mg poqday for 7 days, up lasix to 80 mg poqday over the weekend, and add albuterol 2 puffs every 6 hours. Add levaquin 500 mg poqday for 5 days and recheck here on Monday. 07/15/20 Dr. Dennard Schaumann For follow-up. STARTED Albuterol Sulfate 108 (90 Base) MCG/ACT 2 puffs inhalation every 6 hours PRN, Levofloxacin 500 mg daily. CHANGED Prednisone to 60 mg. Per note: increase her Lasix to 80 mg a day 07/08/20 Dr. Dennard Schaumann For hospital Follow-up. STARTED Prednisone 20 mg pack.  06/30/20 (Telemedicine) Eulogio Bear, NP. For follow-up. No medication changes.   Recent consult visits:  None since 05/20/20   Hospital visits:  07/15/20 [facility] (5 Hours) For COPD. No medication changes.  06/24/20 [facility] ( 2 Days) Kathie Dike, MD. For acute respiratory failure with hypoxia. STOPPED Calcitriol, Ondansetron, prednisone, and Psyllium. 05/23/20 [facility] Edwinna Areola, PA For fall. No medication changes.  Medications: Outpatient Encounter Medications as of 10/08/2020  Medication Sig   Accu-Chek Softclix Lancets lancets CHECK BLOOD SUGAR 3 TIMES  DAILY BEFORE MEALS AND AT   BEDTIME   acetaminophen (TYLENOL) 500 MG tablet Take 500 mg by mouth every 6 (six) hours as needed for pain.   albuterol (VENTOLIN HFA) 108 (90 Base) MCG/ACT inhaler Inhale 2 puffs into the lungs every 6 (six) hours as needed for wheezing or shortness of breath.   amLODipine (NORVASC) 10 MG tablet TAKE ONE-HALF TABLET BY  MOUTH DAILY   Blood Glucose Monitoring Suppl (ACCU-CHEK AVIVA PLUS) w/Device KIT Use as directed to monitor FSBS 2x daily. Dx: E11.9   carvedilol (COREG) 12.5 MG tablet TAKE 1 TABLET BY MOUTH  TWICE DAILY   Cholecalciferol (VITAMIN D) 2000 UNITS CAPS Take 2,000 Units by mouth daily.    cloNIDine (CATAPRES) 0.3 MG tablet TAKE 1 TABLET BY MOUTH  TWICE DAILY   dicyclomine (BENTYL) 20 MG tablet Take 1 tablet (20 mg total) by mouth every 6 (six) hours as needed for spasms (intestinal spasms).   febuxostat (ULORIC) 40 MG tablet TAKE 1 TABLET BY MOUTH  DAILY   Fluticasone-Umeclidin-Vilant (TRELEGY ELLIPTA) 100-62.5-25 MCG/INH AEPB Inhale 1 Inhaler into the lungs daily.   furosemide (LASIX) 40 MG tablet TAKE 1 AND 1/2 TABLETS BY  MOUTH DAILY (Patient taking differently: 60 mg.)   gabapentin (NEURONTIN) 100 MG capsule TAKE 1 TO 2 CAPSULES BY  MOUTH AT BEDTIME FOR  PERIPHERAL NEUROPATHY (Patient taking differently: Take 100 mg by mouth See admin instructions. TAKE 1 TO 2 CAPSULES BY MOUTH AT BEDTIME FOR PERIPHERAL NEUROPATHY.)   guaiFENesin (MUCINEX) 600 MG 12 hr tablet Take 1 tablet (600 mg total) by mouth 2 (two) times daily.   HUMALOG KWIKPEN 100 UNIT/ML KwikPen INJECT SUBCUTANEOUSLY  7  UNITS 3 TIMES DAILY (Patient taking differently: Inject 7 Units into the skin 3 (three) times daily.)   Insulin Pen Needle (CLICKFINE PEN NEEDLES) 31G X 6 MM MISC USE AS DIRECTED TO INJECT  INSULIN SUBCUTANEOUSLY 3  TIMES DAILY   ipratropium-albuterol (DUONEB) 0.5-2.5 (3) MG/3ML SOLN Take 3 mLs by nebulization every 6 (six) hours as needed.   LEVEMIR FLEXTOUCH 100 UNIT/ML FlexTouch Pen INJECT 20 UNITS   SUBCUTANEOUSLY ONCE DAILY  AT BEDTIME (10PM)   linaclotide (LINZESS) 145 MCG CAPS capsule Take 1 capsule (145 mcg total) by mouth daily before breakfast.   omeprazole (PRILOSEC) 20 MG capsule TAKE 1 CAPSULE BY MOUTH  DAILY   polyethylene glycol (MIRALAX) packet Take 17 g by mouth daily. Hold for diarrhea.   simvastatin (ZOCOR) 20 MG tablet TAKE 1 TABLET BY MOUTH ONCE DAILY AT BEDTIME   XARELTO 15 MG TABS tablet TAKE 1 TABLET BY MOUTH  DAILY   No facility-administered encounter medications on file as of 10/08/2020.   Reviewed chart prior to disease state call. Spoke with patient regarding BP  Recent Office Vitals: BP Readings from Last 3 Encounters:  09/14/20 128/78  08/24/20 (!) 144/82  07/23/20 108/76   Pulse Readings from Last 3 Encounters:  09/14/20 67  08/24/20 72  07/23/20 73    Wt Readings from Last 3 Encounters:  09/14/20 175 lb 9.6 oz (79.7 kg)  08/24/20 180 lb (81.6 kg)  07/23/20 163 lb (73.9 kg)     Kidney Function Lab Results  Component Value Date/Time   CREATININE 1.78 (H) 09/14/2020 04:29 PM   CREATININE 1.56 (H) 08/24/2020 04:31 PM   GFRNONAA 17 (L) 07/23/2020 04:12 PM   GFRAA 19 (L) 07/23/2020 04:12 PM    BMP Latest Ref Rng & Units 09/14/2020 08/24/2020 07/29/2020  Glucose 65 - 99 mg/dL 96 49(L) 158(H)  BUN 7 - 25 mg/dL 29(H) 26(H) 38(H)  Creatinine 0.60 - 0.95 mg/dL 1.78(H) 1.56(H) 1.86(H)  BUN/Creat Ratio 6 - 22 (calc) '16 17 20  ' Sodium 135 - 146 mmol/L 144 142 139  Potassium 3.5 - 5.3 mmol/L 4.1 3.9 4.2  Chloride 98 - 110 mmol/L 106 105 103  CO2 20 - 32 mmol/L '25 26 28  ' Calcium 8.6 - 10.4 mg/dL 9.0 8.2(L) 8.4(L)    Current antihypertensive regimen:  amlodipine 109m daily carvedilol 12.575mtwice daily, clonidine 0.68m56mHow often are you checking your Blood Pressure? Patient stated daily  Current home BP readings: Patient stated   What recent interventions/DTPs have been made by any provider to improve Blood Pressure control since last CPP Visit:  None.   Any recent hospitalizations or ED visits since last visit with CPP? Patient stated no.  What diet changes have been made to improve Blood Pressure Control?  Patient stated she eats a well rounded daily.   What exercise is being done to improve your Blood Pressure Control?  Patient stated she exercies daily and does daily duties around the hous.  Adherence Review: Is the patient currently on ACE/ARB medication? N/A.  Does the patient have >5 day gap between last estimated fill dates? N/A.  Care Gaps: Patient is due for a eye exam.   Star Rating Drugs: Simvastatin 20 mg 08/26/20 90 DS.   Follow-Up:Pharmacist Review  VerCharlann LangeMAGratiotarmacist Assistant [phone]

## 2020-10-15 ENCOUNTER — Other Ambulatory Visit: Payer: Self-pay | Admitting: *Deleted

## 2020-10-15 ENCOUNTER — Other Ambulatory Visit: Payer: Self-pay | Admitting: Family Medicine

## 2020-10-15 DIAGNOSIS — E1165 Type 2 diabetes mellitus with hyperglycemia: Secondary | ICD-10-CM

## 2020-10-15 DIAGNOSIS — E1121 Type 2 diabetes mellitus with diabetic nephropathy: Secondary | ICD-10-CM

## 2020-10-15 MED ORDER — CLICKFINE PEN NEEDLES 31G X 6 MM MISC: 1 refills | Status: DC

## 2020-10-19 ENCOUNTER — Other Ambulatory Visit: Payer: Self-pay | Admitting: Family Medicine

## 2020-10-19 DIAGNOSIS — E1142 Type 2 diabetes mellitus with diabetic polyneuropathy: Secondary | ICD-10-CM

## 2020-10-22 ENCOUNTER — Telehealth: Payer: Self-pay | Admitting: Family Medicine

## 2020-10-22 NOTE — Telephone Encounter (Signed)
Insulin Pen Needle (CLICKFINE PEN NEEDLES) 31G X 6 MM MISC [id number]    Order Details Dose, Route, Frequency: As Directed  Dispense Quantity: 270 each Refills: 1        Sig: USE AS DIRECTED TO INJECT  INSULIN SUBCUTANEOUSLY 3  TIMES DAILY  2nd attempt per fax

## 2020-10-26 MED ORDER — CLICKFINE PEN NEEDLES 31G X 6 MM MISC: 1 refills | Status: DC

## 2020-10-26 NOTE — Telephone Encounter (Signed)
Prescription sent to pharmacy.

## 2020-10-29 ENCOUNTER — Other Ambulatory Visit: Payer: Self-pay

## 2020-10-29 ENCOUNTER — Ambulatory Visit (INDEPENDENT_AMBULATORY_CARE_PROVIDER_SITE_OTHER): Payer: Medicare Other | Admitting: Student

## 2020-10-29 ENCOUNTER — Ambulatory Visit: Payer: Medicare Other | Admitting: Family Medicine

## 2020-10-29 ENCOUNTER — Encounter: Payer: Self-pay | Admitting: Student

## 2020-10-29 VITALS — HR 76 | Ht 63.0 in | Wt 177.0 lb

## 2020-10-29 DIAGNOSIS — Z23 Encounter for immunization: Secondary | ICD-10-CM

## 2020-10-29 DIAGNOSIS — E785 Hyperlipidemia, unspecified: Secondary | ICD-10-CM | POA: Diagnosis not present

## 2020-10-29 DIAGNOSIS — I10 Essential (primary) hypertension: Secondary | ICD-10-CM

## 2020-10-29 DIAGNOSIS — I4821 Permanent atrial fibrillation: Secondary | ICD-10-CM

## 2020-10-29 DIAGNOSIS — I5032 Chronic diastolic (congestive) heart failure: Secondary | ICD-10-CM | POA: Diagnosis not present

## 2020-10-29 DIAGNOSIS — I2510 Atherosclerotic heart disease of native coronary artery without angina pectoris: Secondary | ICD-10-CM | POA: Diagnosis not present

## 2020-10-29 DIAGNOSIS — N184 Chronic kidney disease, stage 4 (severe): Secondary | ICD-10-CM

## 2020-10-29 NOTE — Progress Notes (Signed)
Cardiology Office Note    Date:  10/29/2020   ID:  Laura Best, DOB Apr 10, 1940, MRN [id number]  PCP:  Susy Frizzle, MD  Cardiologist: Carlyle Dolly, MD    Chief Complaint  Patient presents with   Follow-up    Annual Visit    History of Present Illness:    Laura Best is a 80 y.o. female with past medical history of CAD (cath in 05/2016 showing 50% LCx stenosis), HFpEF, permanent atrial fibrillation, history of ASD (s/p repair), HTN, HLD, Stage 3-4 CKD and COPD who presents to the office today for annual follow-up.   She was last examined by Cecilie Kicks, NP in 09/2019 and reported having baseline dyspnea on exertion but denied any associated chest pain or palpitations. She was continued on her current medications at that time.  By review of notes, she was admitted to [facility] in 06/2020 for acute hypoxic respiratory failure in the setting of a CHF exacerbation. A repeat echocardiogram was performed that admission and showed a preserved EF of 55 to 60% with no regional wall motion abnormalities. She did have moderate LVH, mildly reduced RV function, biatrial dilation and trivial MR. She was discharged on Lasix 60 mg daily.   In talking with the patient today, she reports she initially had some intermittent fluid issues following hospital discharge but symptoms have improved over the past several weeks. She has reduced Lasix to 40 mg daily at the instruction of her PCP. She denies any recent dyspnea on exertion, orthopnea or PND. No recent chest pain or palpitations. She reports good compliance with Xarelto and denies any evidence of active bleeding.  She does follow daily weights at home and says this has been stable between 172 - 175 lbs on her home scales (at 177 lbs on the office scales today).    Past Medical History:  Diagnosis Date   Arthritis    "hands, feet" (02/17/2016)   Atrial fibrillation (HCC)    CAD (coronary artery disease)    a. 05/2016: cath showing  nonobstructive CAD with 50% prox-Cx stenosis   CKD (chronic kidney disease) stage 3, GFR 30-59 ml/min (HCC) 12/25/2012   COPD (chronic obstructive pulmonary disease) (HCC)    Diastolic heart failure (HCC)    Hyperlipidemia    Hypertension    Osteoporosis    Pneumonia    "several times" (02/17/2016)   Small bowel obstruction (HCC) 02/17/2016   Tricuspid regurgitation    Echo 05/02/06-Nml LV. Mild MR. Mod TR   Type II diabetes mellitus ([place])    Vitamin D deficiency     Past Surgical History:  Procedure Laterality Date   APPENDECTOMY     HEART CHAMBER REVISION     patched hole --[place] N/A 09/27/2013   Procedure: APPENDECTOMY - CONVERTED FROM LAPAROSCOPIC;  Surgeon: Donnie Mesa, MD;  Location: [facility];  Service: General;  Laterality: N/A;   RIGHT/LEFT HEART CATH AND CORONARY ANGIOGRAPHY N/A 05/24/2016   Procedure: Right/Left Heart Cath and Coronary Angiography;  Surgeon: Larey Dresser, MD;  Location: [facility];  Service: Cardiovascular;  Laterality: N/A;   TUBAL LIGATION      Current Medications: Outpatient Medications Prior to Visit  Medication Sig Dispense Refill   Accu-Chek Softclix Lancets lancets CHECK BLOOD SUGAR 3 TIMES  DAILY BEFORE MEALS AND AT  BEDTIME 400 each 3   acetaminophen (TYLENOL) 500 MG tablet Take 500 mg by mouth every 6 (six) hours as needed  for pain.     albuterol (VENTOLIN HFA) 108 (90 Base) MCG/ACT inhaler Inhale 2 puffs into the lungs every 6 (six) hours as needed for wheezing or shortness of breath. 8 g 0   amLODipine (NORVASC) 10 MG tablet TAKE ONE-HALF TABLET BY  MOUTH DAILY 45 tablet 3   Blood Glucose Monitoring Suppl (ACCU-CHEK AVIVA PLUS) w/Device KIT Use as directed to monitor FSBS 2x daily. Dx: E11.9 1 kit 1   carvedilol (COREG) 12.5 MG tablet TAKE 1 TABLET BY MOUTH  TWICE DAILY 180 tablet 3   Cholecalciferol (VITAMIN D) 2000 UNITS CAPS Take 2,000 Units by mouth daily.      cloNIDine (CATAPRES) 0.3 MG tablet TAKE  1 TABLET BY MOUTH  TWICE DAILY 180 tablet 3   febuxostat (ULORIC) 40 MG tablet TAKE 1 TABLET BY MOUTH  DAILY 90 tablet 3   Fluticasone-Umeclidin-Vilant (TRELEGY ELLIPTA) 100-62.5-25 MCG/INH AEPB Inhale 1 Inhaler into the lungs daily. 1 each 11   furosemide (LASIX) 40 MG tablet Take 40 mg by mouth daily.     gabapentin (NEURONTIN) 100 MG capsule TAKE 1 TO 2 CAPSULES BY  MOUTH AT BEDTIME FOR  PERIPHERAL NEUROPATHY 180 capsule 3   guaiFENesin (MUCINEX) 600 MG 12 hr tablet Take 1 tablet (600 mg total) by mouth 2 (two) times daily. 30 tablet 1   HUMALOG KWIKPEN 100 UNIT/ML KwikPen INJECT SUBCUTANEOUSLY 7  UNITS 3 TIMES DAILY (Patient taking differently: Inject 7 Units into the skin 3 (three) times daily.) 30 mL 3   Insulin Pen Needle (CLICKFINE PEN NEEDLES) 31G X 6 MM MISC USE AS DIRECTED TO INJECT  INSULIN SUBCUTANEOUSLY 3  TIMES DAILY 270 each 1   ipratropium-albuterol (DUONEB) 0.5-2.5 (3) MG/3ML SOLN Take 3 mLs by nebulization every 6 (six) hours as needed. 360 mL 5   LEVEMIR FLEXTOUCH 100 UNIT/ML FlexTouch Pen INJECT 20 UNITS  SUBCUTANEOUSLY ONCE DAILY  AT BEDTIME (10PM) 30 mL 3   linaclotide (LINZESS) 145 MCG CAPS capsule Take 1 capsule (145 mcg total) by mouth daily before breakfast. 90 capsule 3   omeprazole (PRILOSEC) 20 MG capsule TAKE 1 CAPSULE BY MOUTH  DAILY 90 capsule 3   polyethylene glycol (MIRALAX) packet Take 17 g by mouth daily. Hold for diarrhea. 28 each 1   simvastatin (ZOCOR) 20 MG tablet TAKE 1 TABLET BY MOUTH ONCE DAILY AT BEDTIME 90 tablet 3   XARELTO 15 MG TABS tablet TAKE 1 TABLET BY MOUTH  DAILY 90 tablet 3   dicyclomine (BENTYL) 20 MG tablet Take 1 tablet (20 mg total) by mouth every 6 (six) hours as needed for spasms (intestinal spasms). (Patient not taking: Reported on 10/29/2020) 30 tablet 0   furosemide (LASIX) 40 MG tablet TAKE 1 AND 1/2 TABLETS BY  MOUTH DAILY (Patient not taking: Reported on 10/29/2020) 135 tablet 3   No facility-administered medications prior to visit.      Allergies:   Ace inhibitors, Actos [pioglitazone], Aspirin, Metformin and related, and Eliquis [apixaban]   Social History   Socioeconomic History   Marital status: Widowed    Spouse name: Not on file   Number of children: Not on file   Years of education: Not on file   Highest education level: Not on file  Occupational History   Occupation: retired  Tobacco Use   Smoking status: Former    Packs/day: 0.50    Years: 50.00    Pack years: 25.00    Types: Cigarettes    Quit date: 01/19/2009  Years since quitting: 11.7   Smokeless tobacco: Never  Vaping Use   Vaping Use: Never used  Substance and Sexual Activity   Alcohol use: No    Alcohol/week: 0.0 standard drinks   Drug use: No   Sexual activity: Never  Other Topics Concern   Not on file  Social History Narrative   Entered 10/2013:   She has never driven.   She lives with her son.   Social Determinants of Health   Financial Resource Strain: Low Risk    Difficulty of Paying Living Expenses: Not hard at all  Food Insecurity: No Food Insecurity   Worried About Charity fundraiser in the Last Year: Never true   [place] in the Last Year: Never true  Transportation Needs: No Transportation Needs   Lack of Transportation (Medical): No   Lack of Transportation (Non-Medical): No  Physical Activity: Inactive   Days of Exercise per Week: 0 days   Minutes of Exercise per Session: 0 min  Stress: No Stress Concern Present   Feeling of Stress : Not at all  Social Connections: Socially Isolated   Frequency of Communication with Friends and Family: More than three times a week   Frequency of Social Gatherings with Friends and Family: Once a week   Attends Religious Services: Never   Marine scientist or Organizations: No   Attends Archivist Meetings: Never   Marital Status: Widowed     Family History:  The patient's family history includes Cancer in her brother, father, and sister; Diabetes in  her father.   Review of Systems:    Please see the history of present illness.     All other systems reviewed and are otherwise negative except as noted above.   Physical Exam:    VS:  Pulse 76   Ht 5' 3" (1.6 m)   Wt 177 lb (80.3 kg)   SpO2 95%   BMI 31.35 kg/m    General: Pleasant elderly female appearing in no acute distress. Head: Normocephalic, atraumatic. Neck: No carotid bruits. JVD not elevated.  Lungs: Respirations regular and unlabored, without wheezes or rales.  Heart: Irregularly irregular. No S3 or S4.  No murmur, no rubs, or gallops appreciated. Abdomen: Appears non-distended. No obvious abdominal masses. Msk:  Strength and tone appear normal for age. No obvious joint deformities or effusions. Extremities: No clubbing or cyanosis. Trace ankle edema bilaterally.  Distal pedal pulses are 2+ bilaterally. Neuro: Alert and oriented X 3. Moves all extremities spontaneously. No focal deficits noted. Psych:  Responds to questions appropriately with a normal affect. Skin: No rashes or lesions noted  Wt Readings from Last 3 Encounters:  10/29/20 177 lb (80.3 kg)  09/14/20 175 lb 9.6 oz (79.7 kg)  08/24/20 180 lb (81.6 kg)     Studies/Labs Reviewed:   EKG:  EKG is not ordered today.    Recent Labs: 07/08/2020: Brain Natriuretic Peptide 222 08/24/2020: ALT 5; Hemoglobin 10.6; Platelets 276 09/14/2020: BUN 29; Creat 1.78; Potassium 4.1; Sodium 144   Lipid Panel    Component Value Date/Time   CHOL 123 01/06/2020 1104   TRIG 99 01/06/2020 1104   HDL 53 01/06/2020 1104   CHOLHDL 2.3 01/06/2020 1104   VLDL 42 (H) 09/20/2016 1123   LDLCALC 52 01/06/2020 1104    Additional studies/ records that were reviewed today include:   Cardiac Catheterization: 05/2016 1. Near-normal filling pressures.  2. No oxygen saturation step-up between RA and PA  to suggest residual ASD.  3. Mild pulmonary hypertension with low PVR, suggests pulmonary venous hypertension.  4.  Nonobstructive CAD, most significant disease being serial 50% stenoses in the proximal to mid LCx.     Would recommend aspirin and statin treatment for CAD.    She is on an adequate dose of Lasix, would not change.    Hydration post-procedure given CKD.   Echocardiogram: 06/2020 IMPRESSIONS     1. Left ventricular ejection fraction, by estimation, is 55 to 60%. The  left ventricle has normal function. The left ventricle has no regional  wall motion abnormalities. There is moderate left ventricular hypertrophy.  Left ventricular diastolic  parameters are indeterminate.   2. Right ventricular systolic function is mildly reduced. The right  ventricular size is moderately enlarged. There is normal pulmonary artery  systolic pressure. The estimated right ventricular systolic pressure is  88.5 mmHg.   3. Left atrial size was severely dilated.   4. Right atrial size was moderately dilated.   5. The mitral valve is degenerative. Trivial mitral valve regurgitation.  Moderate mitral annular calcification.   6. The aortic valve is tricuspid. There is mild calcification of the  aortic valve. Aortic valve regurgitation is not visualized.   7. The inferior vena cava is dilated in size with >50% respiratory  variability, suggesting right atrial pressure of 8 mmHg.   Assessment:    1. Coronary artery disease involving native coronary artery of native heart without angina pectoris   2. Chronic heart failure with preserved ejection fraction (HCC)   3. Permanent atrial fibrillation ([place])   4. Essential hypertension   5. Hyperlipidemia LDL goal <70   6. CKD (chronic kidney disease) stage 4, GFR 15-29 ml/min (HCC)   7. Need for immunization against influenza      Plan:   In order of problems listed above:  1. CAD - Prior catheterization in 05/2016 showed 50% LCx stenosis with otherwise mild disease as outlined above. She denies any recent chest pain or dyspnea on exertion. - Continue Coreg  12.5 mg twice daily and Simvastatin 20 mg daily. She is not on ASA given the need for anticoagulation.  2. HFpEF - Recent echocardiogram in 06/2020 showed a preserved EF of 55 to 60% with no regional wall motion abnormalities as outlined above. She has experienced intermittent lower extremity edema but denies any orthopnea, PND or pitting edema. Continue Lasix 40 mg daily and we reviewed she could take an extra tablet if needed for weight gain greater than 2 pounds overnight or 5 pounds in 1 week.  3. Permanent Atrial Fibrillation - She denies any recent palpitations and her heart rate is well controlled in the 70's during today's visit. Continue Coreg 12.5 mg twice daily for rate control. - She denies any evidence of active bleeding and remains on Xarelto 15 mg daily for anticoagulation which is the appropriate dose based off her calculated creatinine clearance of 32 mL/min.  4. HTN - Her blood pressure has been well controlled and she denies any recent dizziness. Continue current medication regimen with Amlodipine 5 mg daily, Clonidine 0.3 mg twice daily and Coreg 12.5 mg twice daily.  5. HLD - Most recent FLP in 2021 showed her LDL was at 52. She is on Simvastatin 20 mg daily and is also on Amlodipine but reports being on these for quite some time and denies any side effects.  Would not further titrate Simvastatin past 20 mg daily given concurrent use of Amlodipine.  If LDL ever goes above goal, would recommend switching Simvastatin to Crestor given the drug to drug interaction.  6. Stage 3-4 CKD - Creatinine was at 1.78 in 08/2020 which is close to her baseline.   7. Need for Flu Vaccine - Flu shot administered by nursing staff during today's visit.    Medication Adjustments/Labs and Tests Ordered: Current medicines are reviewed at length with the patient today.  Concerns regarding medicines are outlined above.  Medication changes, Labs and Tests ordered today are listed in the Patient  Instructions below. Patient Instructions  Medication Instructions:   Your physician recommends that you continue on your current medications as directed. Please refer to the Current Medication list given to you today.  *If you need a refill on your cardiac medications before your next appointment, please call your pharmacy*   Lab Work: None today  If you have labs (blood work) drawn today and your tests are completely normal, you will receive your results only by: [place] (if you have MyChart) OR A paper copy in the mail If you have any lab test that is abnormal or we need to change your treatment, we will call you to review the results.   Testing/Procedures: None today    Follow-Up: At [facility], you and your health needs are our priority.  As part of our continuing mission to provide you with exceptional heart care, we have created designated Provider Care Teams.  These Care Teams include your primary Cardiologist (physician) and Advanced Practice Providers (APPs -  Physician Assistants and Nurse Practitioners) who all work together to provide you with the care you need, when you need it.  We recommend signing up for the patient portal called "MyChart".  Sign up information is provided on this After Visit Summary.  MyChart is used to connect with patients for Virtual Visits (Telemedicine).  Patients are able to view lab/test results, encounter notes, upcoming appointments, etc.  Non-urgent messages can be sent to your provider as well.   To learn more about what you can do with MyChart, go to [email or web address].    Your next appointment:   6 month(s)  The format for your next appointment:   In Person  Provider:   You may see Carlyle Dolly, MD or one of the following Advanced Practice Providers on your designated Care Team:   Bernerd Pho, PA-C     Other Instructions none   Signed, Erma Heritage, Hershal Coria  10/29/2020 5:13 PM    [address] [place], [place] [address] Phone: [phone] Fax: [phone]

## 2020-10-29 NOTE — Patient Instructions (Addendum)
Medication Instructions:   Your physician recommends that you continue on your current medications as directed. Please refer to the Current Medication list given to you today.  *If you need a refill on your cardiac medications before your next appointment, please call your pharmacy*   Lab Work: None today  If you have labs (blood work) drawn today and your tests are completely normal, you will receive your results only by: [place] (if you have MyChart) OR A paper copy in the mail If you have any lab test that is abnormal or we need to change your treatment, we will call you to review the results.   Testing/Procedures: None today    Follow-Up: At [facility], you and your health needs are our priority.  As part of our continuing mission to provide you with exceptional heart care, we have created designated Provider Care Teams.  These Care Teams include your primary Cardiologist (physician) and Advanced Practice Providers (APPs -  Physician Assistants and Nurse Practitioners) who all work together to provide you with the care you need, when you need it.  We recommend signing up for the patient portal called "MyChart".  Sign up information is provided on this After Visit Summary.  MyChart is used to connect with patients for Virtual Visits (Telemedicine).  Patients are able to view lab/test results, encounter notes, upcoming appointments, etc.  Non-urgent messages can be sent to your provider as well.   To learn more about what you can do with MyChart, go to [email or web address].    Your next appointment:   6 month(s)  The format for your next appointment:   In Person  Provider:   You may see Carlyle Dolly, MD or one of the following Advanced Practice Providers on your designated Care Team:   Bernerd Pho, PA-C     Other Instructions none

## 2020-11-25 ENCOUNTER — Telehealth: Payer: Self-pay | Admitting: *Deleted

## 2020-11-25 NOTE — Telephone Encounter (Signed)
Received call from patient.   Reports that she has increased tremors in her legs and is not able to stand or walk due to shaking.   Also reports that she has scabbed sores in her head. Advised that patient will need to be seen to evaluate.   Appointment scheduled.

## 2020-11-26 ENCOUNTER — Encounter: Payer: Self-pay | Admitting: Family Medicine

## 2020-11-26 ENCOUNTER — Other Ambulatory Visit: Payer: Self-pay

## 2020-11-26 ENCOUNTER — Ambulatory Visit (INDEPENDENT_AMBULATORY_CARE_PROVIDER_SITE_OTHER): Payer: Medicare Other | Admitting: Family Medicine

## 2020-11-26 VITALS — BP 110/58 | HR 64 | Temp 98.1°F | Resp 16 | Ht 63.0 in | Wt 179.0 lb

## 2020-11-26 DIAGNOSIS — L299 Pruritus, unspecified: Secondary | ICD-10-CM

## 2020-11-26 DIAGNOSIS — R6883 Chills (without fever): Secondary | ICD-10-CM | POA: Diagnosis not present

## 2020-11-26 LAB — URINALYSIS, ROUTINE W REFLEX MICROSCOPIC
Bilirubin Urine: NEGATIVE
Glucose, UA: NEGATIVE
Hgb urine dipstick: NEGATIVE
Ketones, ur: NEGATIVE
Leukocytes,Ua: NEGATIVE
Nitrite: NEGATIVE
Specific Gravity, Urine: 1.015 (ref 1.001–1.035)
pH: 5.5 (ref 5.0–8.0)

## 2020-11-26 LAB — MICROSCOPIC MESSAGE

## 2020-11-26 MED ORDER — FLUOCINOLONE ACETONIDE BODY 0.01 % EX OIL: 1.0000 "application " | TOPICAL_OIL | CUTANEOUS | 0 refills | Status: DC

## 2020-11-26 MED ORDER — FLUOCINOLONE ACETONIDE BODY 0.01 % EX OIL: 1.0000 "application " | TOPICAL_OIL | CUTANEOUS | 0 refills | Status: AC

## 2020-11-26 NOTE — Addendum Note (Signed)
Addended by: Sheral Flow on: 11/26/2020 03:36 PM   Modules accepted: Orders

## 2020-11-26 NOTE — Progress Notes (Signed)
Subjective:    Patient ID: Laura Best, female    DOB: [birth year], 80 y.o.   MRN: [id number] Over the last week, the patient states that she has had episodes where she starts to shake.  This will occur suddenly for no reason.  She will experience tremors in her arms and in her hands and all over her body.  It last several minutes.  She denies any syncope.  She denies any bowel or bladder incontinence or tongue biting.  She denies any pill-rolling tremor.  She denies any fevers.  She occasionally has chills.  She denies any cough or chest pain or pleurisy.  She denies any dysuria although she does have increased urinary frequency.  She denies any violent nausea vomiting or diarrhea.  She does report sores in her scalp.  On examination, there are linear excoriations from her forehead to the crown of her head where she has been scratching.  These are scabbed over.  It does not appear to be shingles.  Instead it appears to be scabs from repetitive scratching.  I suspect that she may have neuropathic itching. Past Medical History:  Diagnosis Date   Arthritis    "hands, feet" (02/17/2016)   Atrial fibrillation (HCC)    CAD (coronary artery disease)    a. 05/2016: cath showing nonobstructive CAD with 50% prox-Cx stenosis   CKD (chronic kidney disease) stage 3, GFR 30-59 ml/min (HCC) 12/25/2012   COPD (chronic obstructive pulmonary disease) (HCC)    Diastolic heart failure (HCC)    Hyperlipidemia    Hypertension    Osteoporosis    Pneumonia    "several times" (02/17/2016)   Small bowel obstruction (HCC) 02/17/2016   Tricuspid regurgitation    Echo 05/02/06-Nml LV. Mild MR. Mod TR   Type II diabetes mellitus ([place])    Vitamin D deficiency    Past Surgical History:  Procedure Laterality Date   APPENDECTOMY     HEART CHAMBER REVISION     patched hole --[place] N/A 09/27/2013   Procedure: APPENDECTOMY - CONVERTED FROM LAPAROSCOPIC;  Surgeon: Donnie Mesa, MD;   Location: [place];  Service: General;  Laterality: N/A;   RIGHT/LEFT HEART CATH AND CORONARY ANGIOGRAPHY N/A 05/24/2016   Procedure: Right/Left Heart Cath and Coronary Angiography;  Surgeon: Larey Dresser, MD;  Location: [place] [facility];  Service: Cardiovascular;  Laterality: N/A;   TUBAL LIGATION     Current Outpatient Medications on File Prior to Visit  Medication Sig Dispense Refill   Accu-Chek Softclix Lancets lancets CHECK BLOOD SUGAR 3 TIMES  DAILY BEFORE MEALS AND AT  BEDTIME 400 each 3   acetaminophen (TYLENOL) 500 MG tablet Take 500 mg by mouth every 6 (six) hours as needed for pain.     albuterol (VENTOLIN HFA) 108 (90 Base) MCG/ACT inhaler Inhale 2 puffs into the lungs every 6 (six) hours as needed for wheezing or shortness of breath. 8 g 0   amLODipine (NORVASC) 10 MG tablet TAKE ONE-HALF TABLET BY  MOUTH DAILY 45 tablet 3   Blood Glucose Monitoring Suppl (ACCU-CHEK AVIVA PLUS) w/Device KIT Use as directed to monitor FSBS 2x daily. Dx: E11.9 1 kit 1   carvedilol (COREG) 12.5 MG tablet TAKE 1 TABLET BY MOUTH  TWICE DAILY 180 tablet 3   Cholecalciferol (VITAMIN D) 2000 UNITS CAPS Take 2,000 Units by mouth daily.      cloNIDine (CATAPRES) 0.3 MG tablet TAKE 1 TABLET BY MOUTH  TWICE DAILY  180 tablet 3   dicyclomine (BENTYL) 20 MG tablet Take 1 tablet (20 mg total) by mouth every 6 (six) hours as needed for spasms (intestinal spasms). 30 tablet 0   febuxostat (ULORIC) 40 MG tablet TAKE 1 TABLET BY MOUTH  DAILY 90 tablet 3   Fluticasone-Umeclidin-Vilant (TRELEGY ELLIPTA) 100-62.5-25 MCG/INH AEPB Inhale 1 Inhaler into the lungs daily. 1 each 11   furosemide (LASIX) 40 MG tablet Take 40 mg by mouth daily.     gabapentin (NEURONTIN) 100 MG capsule TAKE 1 TO 2 CAPSULES BY  MOUTH AT BEDTIME FOR  PERIPHERAL NEUROPATHY 180 capsule 3   guaiFENesin (MUCINEX) 600 MG 12 hr tablet Take 1 tablet (600 mg total) by mouth 2 (two) times daily. 30 tablet 1   HUMALOG KWIKPEN 100 UNIT/ML KwikPen INJECT  SUBCUTANEOUSLY 7  UNITS 3 TIMES DAILY (Patient taking differently: Inject 7 Units into the skin 3 (three) times daily.) 30 mL 3   Insulin Pen Needle (CLICKFINE PEN NEEDLES) 31G X 6 MM MISC USE AS DIRECTED TO INJECT  INSULIN SUBCUTANEOUSLY 3  TIMES DAILY 270 each 1   ipratropium-albuterol (DUONEB) 0.5-2.5 (3) MG/3ML SOLN Take 3 mLs by nebulization every 6 (six) hours as needed. 360 mL 5   LEVEMIR FLEXTOUCH 100 UNIT/ML FlexTouch Pen INJECT 20 UNITS  SUBCUTANEOUSLY ONCE DAILY  AT BEDTIME (10PM) 30 mL 3   linaclotide (LINZESS) 145 MCG CAPS capsule Take 1 capsule (145 mcg total) by mouth daily before breakfast. 90 capsule 3   omeprazole (PRILOSEC) 20 MG capsule TAKE 1 CAPSULE BY MOUTH  DAILY 90 capsule 3   simvastatin (ZOCOR) 20 MG tablet TAKE 1 TABLET BY MOUTH ONCE DAILY AT BEDTIME 90 tablet 3   XARELTO 15 MG TABS tablet TAKE 1 TABLET BY MOUTH  DAILY 90 tablet 3   No current facility-administered medications on file prior to visit.    Allergies  Allergen Reactions   Ace Inhibitors Other (See Comments)    Hyperkalemia   Actos [Pioglitazone] Swelling   Aspirin Other (See Comments)    G.I. Upset. In high doses   Metformin And Related Diarrhea   Eliquis [Apixaban] Rash and Other (See Comments)    Patient states that this med was d/ced by prescriber due to interference with allergies and states that she also had a breakout   Social History   Socioeconomic History   Marital status: Widowed    Spouse name: Not on file   Number of children: Not on file   Years of education: Not on file   Highest education level: Not on file  Occupational History   Occupation: retired  Tobacco Use   Smoking status: Former    Packs/day: 0.50    Years: 50.00    Pack years: 25.00    Types: Cigarettes    Quit date: 01/19/2009    Years since quitting: 11.8   Smokeless tobacco: Never  Vaping Use   Vaping Use: Never used  Substance and Sexual Activity   Alcohol use: No    Alcohol/week: 0.0 standard drinks    Drug use: No   Sexual activity: Never  Other Topics Concern   Not on file  Social History Narrative   Entered 10/2013:   She has never driven.   She lives with her son.   Social Determinants of Health   Financial Resource Strain: Low Risk    Difficulty of Paying Living Expenses: Not hard at all  Food Insecurity: No Food Insecurity   Worried About Running Out of  Food in the Last Year: Never true   [place] in the Last Year: Never true  Transportation Needs: No Transportation Needs   Lack of Transportation (Medical): No   Lack of Transportation (Non-Medical): No  Physical Activity: Inactive   Days of Exercise per Week: 0 days   Minutes of Exercise per Session: 0 min  Stress: No Stress Concern Present   Feeling of Stress : Not at all  Social Connections: Socially Isolated   Frequency of Communication with Friends and Family: More than three times a week   Frequency of Social Gatherings with Friends and Family: Once a week   Attends Religious Services: Never   Marine scientist or Organizations: No   Attends Archivist Meetings: Never   Marital Status: Widowed  Human resources officer Violence: Not At Risk   Fear of Current or Ex-Partner: No   Emotionally Abused: No   Physically Abused: No   Sexually Abused: No      Review of Systems  All other systems reviewed and are negative.     Objective:   Physical Exam Vitals reviewed.  Constitutional:      General: She is not in acute distress.    Appearance: Normal appearance. She is well-developed and normal weight. She is not ill-appearing, toxic-appearing or diaphoretic.  HENT:     Head: Normocephalic and atraumatic.  Neck:     Vascular: No JVD.  Cardiovascular:     Rate and Rhythm: Normal rate. Rhythm irregular.     Heart sounds: Normal heart sounds. No murmur heard.   No friction rub. No gallop.  Pulmonary:     Effort: Pulmonary effort is normal. No tachypnea, accessory muscle usage or respiratory  distress.     Breath sounds: Normal breath sounds. No stridor or decreased air movement. No wheezing, rhonchi or rales.  Chest:     Chest wall: No tenderness.  Abdominal:     General: Bowel sounds are normal. There is no distension.     Palpations: Abdomen is soft. There is no mass.     Tenderness: There is no abdominal tenderness. There is no right CVA tenderness, left CVA tenderness, guarding or rebound.     Hernia: No hernia is present.  Musculoskeletal:     Cervical back: Neck supple.     Right lower leg: No edema.     Left lower leg: No edema.  Lymphadenopathy:     Cervical: No cervical adenopathy.  Skin:    Findings: Rash present.  Neurological:     Mental Status: She is alert.          Assessment & Plan:  Shaking chills - Plan: Urinalysis, Routine w reflex microscopic  Scalp itch Patient has an itchy scalp and has been scratching it so much that she has created sores.  There is no evidence of secondary infection or shingles.  I will treat this with Derma-Smoothe lotion 1 application twice weekly as needed for itchy scalp.  I am not certain of the cause of the tremor or the shaking chills.  I wanted to get a urinalysis to rule out a urinary tract infection as a possible cause of rigors.  However urinalysis is completely normal with no blood no nitrates and no leukocyte esterase.  There was protein.  There was no clinical sign of any infection on her exam.  However I was planning to do a additional lab work but the patient left after leaving a urine sample and I  was unable to do so.  If the shaking persists, I would recommend that the patient allow [place] to do basic lab work to rule out any electrolyte disturbances.  No evidence of Parkinson's disease today on exam.  I suspect possible essential tremors but I would avoid primidone unless absolutely necessary

## 2020-12-07 ENCOUNTER — Other Ambulatory Visit: Payer: Self-pay | Admitting: *Deleted

## 2020-12-07 MED ORDER — TRELEGY ELLIPTA 100-62.5-25 MCG/ACT IN AEPB: 1.0000 | INHALATION_SPRAY | Freq: Every day | RESPIRATORY_TRACT | 3 refills | Status: AC

## 2020-12-27 DIAGNOSIS — K219 Gastro-esophageal reflux disease without esophagitis: Secondary | ICD-10-CM | POA: Diagnosis not present

## 2020-12-27 DIAGNOSIS — E1159 Type 2 diabetes mellitus with other circulatory complications: Secondary | ICD-10-CM | POA: Diagnosis not present

## 2020-12-27 DIAGNOSIS — Z9181 History of falling: Secondary | ICD-10-CM | POA: Diagnosis not present

## 2020-12-27 DIAGNOSIS — M1A09X0 Idiopathic chronic gout, multiple sites, without tophus (tophi): Secondary | ICD-10-CM | POA: Diagnosis not present

## 2020-12-27 DIAGNOSIS — K581 Irritable bowel syndrome with constipation: Secondary | ICD-10-CM | POA: Diagnosis not present

## 2020-12-27 DIAGNOSIS — E782 Mixed hyperlipidemia: Secondary | ICD-10-CM | POA: Diagnosis not present

## 2020-12-27 DIAGNOSIS — Z87891 Personal history of nicotine dependence: Secondary | ICD-10-CM | POA: Diagnosis not present

## 2020-12-27 DIAGNOSIS — I509 Heart failure, unspecified: Secondary | ICD-10-CM | POA: Diagnosis not present

## 2020-12-27 DIAGNOSIS — J449 Chronic obstructive pulmonary disease, unspecified: Secondary | ICD-10-CM | POA: Diagnosis not present

## 2021-01-01 DIAGNOSIS — E1165 Type 2 diabetes mellitus with hyperglycemia: Secondary | ICD-10-CM | POA: Diagnosis not present

## 2021-01-01 DIAGNOSIS — Z743 Need for continuous supervision: Secondary | ICD-10-CM | POA: Diagnosis not present

## 2021-01-01 DIAGNOSIS — K5730 Diverticulosis of large intestine without perforation or abscess without bleeding: Secondary | ICD-10-CM | POA: Diagnosis not present

## 2021-01-01 DIAGNOSIS — R0602 Shortness of breath: Secondary | ICD-10-CM | POA: Diagnosis not present

## 2021-01-01 DIAGNOSIS — R069 Unspecified abnormalities of breathing: Secondary | ICD-10-CM | POA: Diagnosis not present

## 2021-01-01 DIAGNOSIS — E785 Hyperlipidemia, unspecified: Secondary | ICD-10-CM | POA: Diagnosis not present

## 2021-01-01 DIAGNOSIS — R6889 Other general symptoms and signs: Secondary | ICD-10-CM | POA: Diagnosis not present

## 2021-01-01 DIAGNOSIS — R109 Unspecified abdominal pain: Secondary | ICD-10-CM | POA: Diagnosis not present

## 2021-01-01 DIAGNOSIS — I499 Cardiac arrhythmia, unspecified: Secondary | ICD-10-CM | POA: Diagnosis not present

## 2021-01-01 DIAGNOSIS — I130 Hypertensive heart and chronic kidney disease with heart failure and stage 1 through stage 4 chronic kidney disease, or unspecified chronic kidney disease: Secondary | ICD-10-CM | POA: Diagnosis not present

## 2021-01-01 DIAGNOSIS — J449 Chronic obstructive pulmonary disease, unspecified: Secondary | ICD-10-CM | POA: Diagnosis not present

## 2021-01-01 DIAGNOSIS — R739 Hyperglycemia, unspecified: Secondary | ICD-10-CM | POA: Diagnosis not present

## 2021-01-01 DIAGNOSIS — K429 Umbilical hernia without obstruction or gangrene: Secondary | ICD-10-CM | POA: Diagnosis not present

## 2021-01-01 DIAGNOSIS — E1122 Type 2 diabetes mellitus with diabetic chronic kidney disease: Secondary | ICD-10-CM | POA: Diagnosis not present

## 2021-01-01 DIAGNOSIS — Z20822 Contact with and (suspected) exposure to covid-19: Secondary | ICD-10-CM | POA: Diagnosis not present

## 2021-01-01 DIAGNOSIS — N189 Chronic kidney disease, unspecified: Secondary | ICD-10-CM | POA: Diagnosis not present

## 2021-01-01 DIAGNOSIS — I4891 Unspecified atrial fibrillation: Secondary | ICD-10-CM | POA: Diagnosis not present

## 2021-01-01 DIAGNOSIS — I5032 Chronic diastolic (congestive) heart failure: Secondary | ICD-10-CM | POA: Diagnosis not present

## 2021-01-01 DIAGNOSIS — I2510 Atherosclerotic heart disease of native coronary artery without angina pectoris: Secondary | ICD-10-CM | POA: Diagnosis not present

## 2021-01-02 DIAGNOSIS — R739 Hyperglycemia, unspecified: Secondary | ICD-10-CM | POA: Diagnosis not present

## 2021-01-05 DIAGNOSIS — H2513 Age-related nuclear cataract, bilateral: Secondary | ICD-10-CM | POA: Diagnosis not present

## 2021-01-05 DIAGNOSIS — E113293 Type 2 diabetes mellitus with mild nonproliferative diabetic retinopathy without macular edema, bilateral: Secondary | ICD-10-CM | POA: Diagnosis not present

## 2021-01-07 DIAGNOSIS — Z09 Encounter for follow-up examination after completed treatment for conditions other than malignant neoplasm: Secondary | ICD-10-CM | POA: Diagnosis not present

## 2021-01-07 DIAGNOSIS — R1030 Lower abdominal pain, unspecified: Secondary | ICD-10-CM | POA: Diagnosis not present

## 2021-01-20 ENCOUNTER — Other Ambulatory Visit: Payer: Self-pay | Admitting: Family Medicine

## 2021-01-30 DIAGNOSIS — Z951 Presence of aortocoronary bypass graft: Secondary | ICD-10-CM | POA: Insufficient documentation

## 2021-01-30 DIAGNOSIS — J449 Chronic obstructive pulmonary disease, unspecified: Secondary | ICD-10-CM | POA: Insufficient documentation

## 2021-01-30 DIAGNOSIS — G629 Polyneuropathy, unspecified: Secondary | ICD-10-CM | POA: Insufficient documentation

## 2021-02-11 ENCOUNTER — Inpatient Hospital Stay: Payer: Medicare Other | Admitting: Family Medicine

## 2021-02-18 DIAGNOSIS — J9601 Acute respiratory failure with hypoxia: Secondary | ICD-10-CM | POA: Diagnosis not present

## 2021-02-18 DIAGNOSIS — E1122 Type 2 diabetes mellitus with diabetic chronic kidney disease: Secondary | ICD-10-CM

## 2021-02-18 DIAGNOSIS — N1832 Chronic kidney disease, stage 3b: Secondary | ICD-10-CM

## 2021-02-18 DIAGNOSIS — J441 Chronic obstructive pulmonary disease with (acute) exacerbation: Secondary | ICD-10-CM | POA: Diagnosis not present

## 2021-02-18 DIAGNOSIS — J440 Chronic obstructive pulmonary disease with acute lower respiratory infection: Secondary | ICD-10-CM | POA: Diagnosis not present

## 2021-02-18 DIAGNOSIS — J189 Pneumonia, unspecified organism: Secondary | ICD-10-CM | POA: Diagnosis not present

## 2021-02-18 DIAGNOSIS — I130 Hypertensive heart and chronic kidney disease with heart failure and stage 1 through stage 4 chronic kidney disease, or unspecified chronic kidney disease: Secondary | ICD-10-CM

## 2021-04-23 ENCOUNTER — Other Ambulatory Visit: Payer: Self-pay | Admitting: Family Medicine

## 2021-04-25 DIAGNOSIS — E1140 Type 2 diabetes mellitus with diabetic neuropathy, unspecified: Secondary | ICD-10-CM

## 2021-04-25 DIAGNOSIS — I482 Chronic atrial fibrillation, unspecified: Secondary | ICD-10-CM

## 2021-04-25 DIAGNOSIS — I5032 Chronic diastolic (congestive) heart failure: Secondary | ICD-10-CM | POA: Diagnosis not present

## 2021-04-25 DIAGNOSIS — J449 Chronic obstructive pulmonary disease, unspecified: Secondary | ICD-10-CM | POA: Diagnosis not present

## 2021-04-25 DIAGNOSIS — N1832 Chronic kidney disease, stage 3b: Secondary | ICD-10-CM

## 2021-04-25 DIAGNOSIS — E1122 Type 2 diabetes mellitus with diabetic chronic kidney disease: Secondary | ICD-10-CM | POA: Diagnosis not present

## 2021-04-25 DIAGNOSIS — I130 Hypertensive heart and chronic kidney disease with heart failure and stage 1 through stage 4 chronic kidney disease, or unspecified chronic kidney disease: Secondary | ICD-10-CM | POA: Diagnosis not present

## 2021-05-02 ENCOUNTER — Telehealth: Payer: Self-pay | Admitting: Pharmacist

## 2021-05-02 NOTE — Chronic Care Management (AMB) (Signed)
? ? ?  Chronic Care Management ?Pharmacy Assistant  ? ?Name: Laura Best  MRN: [id number] DOB: 02/03/1940 ?I called [name] to schedule her a follow up visit with the clinical pharmacist, upon scheduling the patient informed me that [place], and now is been seen at [facility], Dr. Temple Pacini.  ? ? ? ?SIG : Shontae Stokes, CMA (PTM)  ?

## 2021-05-13 DIAGNOSIS — Z0000 Encounter for general adult medical examination without abnormal findings: Secondary | ICD-10-CM

## 2021-05-13 NOTE — Progress Notes (Deleted)
? ?Subjective:  ? Laura Best is a 81 y.o. female who presents for Medicare Annual (Subsequent) preventive examination. ? ?Review of Systems    ?*** ?  ? ?   ?Objective:  ?  ?There were no vitals filed for this visit. ?There is no height or weight on file to calculate BMI. ? ? ?  07/15/2020  ?  5:40 PM 06/24/2020  ?  5:15 PM 06/24/2020  ?  5:00 PM 05/10/2020  ? 11:20 AM 10/05/2018  ?  4:09 PM 10/21/2017  ? 10:08 PM 05/09/2017  ?  9:29 AM  ?Advanced Directives  ?Does Patient Have a Medical Advance Directive? No  _0   ?Would patient like information on creating a medical advance directive? No - Patient declined No - Patient declined  No - Patient declined  Yes (Inpatient - patient defers creating a medical advance directive at this time)   ? ? ?Current Medications (verified) ?Outpatient Encounter Medications as of 05/13/2021  ?Medication Sig  ? ACCU-CHEK GUIDE test strip CHECK BLOOD SUGAR 3 TIMES  DAILY BEFORE MEALS AND AT  BEDTIME  ? Accu-Chek Softclix Lancets lancets CHECK BLOOD SUGAR 3 TIMES  DAILY BEFORE MEALS AND AT  BEDTIME  ? acetaminophen (TYLENOL) 500 MG tablet Take 500 mg by mouth every 6 (six) hours as needed for pain.  ? albuterol (VENTOLIN HFA) 108 (90 Base) MCG/ACT inhaler Inhale 2 puffs into the lungs every 6 (six) hours as needed for wheezing or shortness of breath.  ? amLODipine (NORVASC) 10 MG tablet TAKE ONE-HALF TABLET BY  MOUTH DAILY  ? Blood Glucose Monitoring Suppl (ACCU-CHEK AVIVA PLUS) w/Device KIT Use as directed to monitor FSBS 2x daily. Dx: E11.9  ? carvedilol (COREG) 12.5 MG tablet TAKE 1 TABLET BY MOUTH  TWICE DAILY  ? cefdinir (OMNICEF) 300 MG capsule Take 300 mg by mouth 2 (two) times daily.  ? Cholecalciferol (VITAMIN D) 2000 UNITS CAPS Take 2,000 Units by mouth daily.   ? cloNIDine (CATAPRES) 0.3 MG tablet TAKE 1 TABLET BY MOUTH  TWICE DAILY  ? dicyclomine (BENTYL) 20 MG tablet Take 1 tablet (20 mg total) by mouth every 6 (six) hours as needed for spasms (intestinal spasms).  ?  febuxostat (ULORIC) 40 MG tablet TAKE 1 TABLET BY MOUTH  DAILY  ? Fluocinolone Acetonide Body (DERMA-SMOOTHE/FS BODY) 0.01 % OIL Apply 1 application topically 2 (two) times a week. As needed for itchy scalp  ? Fluticasone-Umeclidin-Vilant (TRELEGY ELLIPTA) 100-62.5-25 MCG/ACT AEPB Inhale 1 puff into the lungs daily.  ? furosemide (LASIX) 40 MG tablet Take 40 mg by mouth daily.  ? gabapentin (NEURONTIN) 100 MG capsule TAKE 1 TO 2 CAPSULES BY  MOUTH AT BEDTIME FOR  PERIPHERAL NEUROPATHY  ? guaiFENesin (MUCINEX) 600 MG 12 hr tablet Take 1 tablet (600 mg total) by mouth 2 (two) times daily.  ? HUMALOG KWIKPEN 100 UNIT/ML KwikPen INJECT SUBCUTANEOUSLY 7  UNITS 3 TIMES DAILY (Patient taking differently: Inject 7 Units into the skin 3 (three) times daily.)  ? Insulin Pen Needle (EASY TOUCH PEN NEEDLES) 31G X 6 MM MISC USE AS DIRECTED TO INJECT  INSULIN SUBCUTANEOUSLY 3  TIMES DAILY  ? ipratropium-albuterol (DUONEB) 0.5-2.5 (3) MG/3ML SOLN Take 3 mLs by nebulization every 6 (six) hours as needed.  ? LEVEMIR FLEXTOUCH 100 UNIT/ML FlexTouch Pen INJECT 20 UNITS  SUBCUTANEOUSLY ONCE DAILY  AT BEDTIME (10PM)  ? linaclotide (LINZESS) 145 MCG CAPS capsule Take 1 capsule (145 mcg total) by mouth daily before breakfast.  ?  omeprazole (PRILOSEC) 20 MG capsule TAKE 1 CAPSULE BY MOUTH  DAILY  ? simvastatin (ZOCOR) 20 MG tablet TAKE 1 TABLET BY MOUTH ONCE DAILY AT BEDTIME  ? XARELTO 15 MG TABS tablet TAKE 1 TABLET BY MOUTH  DAILY  ? ?No facility-administered encounter medications on file as of 05/13/2021.  ? ? ?Allergies (verified) ?Ace inhibitors, Actos [pioglitazone], Aspirin, Metformin and related, and Eliquis [apixaban]  ? ?History: ?Past Medical History:  ?Diagnosis Date  ? Arthritis   ? "hands, feet" (02/17/2016)  ? Atrial fibrillation ([place])   ? CAD (coronary artery disease)   ? a. 05/2016: cath showing nonobstructive CAD with 50% prox-Cx stenosis  ? CKD (chronic kidney disease) stage 3, GFR 30-59 ml/min (HCC) 12/25/2012  ? COPD  (chronic obstructive pulmonary disease) ([place])   ? Diastolic heart failure ([place])   ? Hyperlipidemia   ? Hypertension   ? Osteoporosis   ? Pneumonia   ? "several times" (02/17/2016)  ? Small bowel obstruction ([place]) 02/17/2016  ? Tricuspid regurgitation   ? Echo 05/02/06-Nml LV. Mild MR. Mod TR  ? Type II diabetes mellitus ([place])   ? Vitamin D deficiency   ? ?Past Surgical History:  ?Procedure Laterality Date  ? APPENDECTOMY    ? HEART CHAMBER REVISION    ? patched hole --[place]  ? LAPAROSCOPIC APPENDECTOMY N/A 09/27/2013  ? Procedure: APPENDECTOMY - CONVERTED FROM LAPAROSCOPIC;  Surgeon: Donnie Mesa, MD;  Location: [place];  Service: General;  Laterality: N/A;  ? RIGHT/LEFT HEART CATH AND CORONARY ANGIOGRAPHY N/A 05/24/2016  ? Procedure: Right/Left Heart Cath and Coronary Angiography;  Surgeon: Larey Dresser, MD;  Location: [place] [facility];  Service: Cardiovascular;  Laterality: N/A;  ? TUBAL LIGATION    ? ?Family History  ?Problem Relation Age of Onset  ? Cancer Father   ? Diabetes Father   ? Cancer Brother   ?     Brain  ? Cancer Sister   ?     abdominal fat  ? ?Social History  ? ?Socioeconomic History  ? Marital status: Widowed  ?  Spouse name: Not on file  ? Number of children: Not on file  ? Years of education: Not on file  ? Highest education level: Not on file  ?Occupational History  ? Occupation: retired  ?Tobacco Use  ? Smoking status: Former  ?  Packs/day: 0.50  ?  Years: 50.00  ?  Pack years: 25.00  ?  Types: Cigarettes  ?  Quit date: 01/19/2009  ?  Years since quitting: 12.3  ? Smokeless tobacco: Never  ?Vaping Use  ? Vaping Use: Never used  ?Substance and Sexual Activity  ? Alcohol use: No  ?  Alcohol/week: 0.0 standard drinks  ? Drug use: No  ? Sexual activity: Never  ?Other Topics Concern  ? Not on file  ?Social History Narrative  ? Entered 10/2013:  ? She has never driven.  ? She lives with her son.  ? ?Social Determinants of Health  ? ?Financial Resource Strain: Not on file  ?Food Insecurity: Not on  file  ?Transportation Needs: Not on file  ?Physical Activity: Not on file  ?Stress: Not on file  ?Social Connections: Not on file  ? ? ?Tobacco Counseling ?Counseling given: Not Answered ? ? ?Clinical Intake: ? ?  ? ?  ? ?  ? ?  ? ?  ? ?Diabetic?*** ? ?  ? ?  ? ? ?Activities of Daily Living ? ?  06/24/2020  ?  5:00 PM  ?In your present state of health, do you have any difficulty performing the following activities:  ?Hearing? 0  ?Vision? 0  ?Difficulty concentrating or making decisions? 0  ?Walking or climbing stairs? 1  ?Dressing or bathing? 0  ?Doing errands, shopping? 0  ? ? ?Patient Care Team: ?Susy Frizzle, MD as PCP - General (Family Medicine) ?Arnoldo Lenis, MD as PCP - Cardiology (Cardiology) ?Susy Frizzle, MD (Family Medicine) ?Edythe Clarity, [facility] as Pharmacist (Pharmacist) ? ?Indicate any recent Medical Services you may have received from other than [facility] providers in the past year (date may be approximate). ? ?   ?Assessment:  ? This is a routine wellness examination for Laura Best. ? ?Hearing/Vision screen ?No results found. ? ?Dietary issues and exercise activities discussed: ?  ? ? Goals Addressed   ?None ?  ? ?Depression Screen ? ?  05/10/2020  ? 11:23 AM 03/17/2019  ? 11:20 AM 03/17/2019  ? 10:31 AM 12/11/2017  ?  2:29 PM 01/24/2017  ? 12:04 PM 12/27/2016  ?  2:18 PM 09/20/2016  ? 10:39 AM  ?PHQ 2/9 Scores  ?PHQ - 2 Score 0 0 0 0 0 0 0  ?PHQ- 9 Score 0      0  ?  ?Fall Risk ? ?  05/10/2020  ? 11:22 AM 03/17/2019  ? 10:31 AM 12/11/2017  ?  2:29 PM 01/24/2017  ? 12:04 PM 12/27/2016  ?  2:18 PM  ?Fall Risk   ?Falls in the past year? 0 0 0 No No  ?Number falls in past yr: 0      ?Injury with Fall? 0      ?Risk for fall due to : Medication side effect      ?Follow up Falls evaluation completed;Falls prevention discussed Falls evaluation completed Falls evaluation completed    ? ? ?FALL RISK PREVENTION PERTAINING TO THE HOME: ? ?Any stairs in or around the home? {YES/NO:21197} ?If so, are there any without  handrails? {YES/NO:21197} ?Home free of loose throw rugs in walkways, pet beds, electrical cords, etc? {YES/NO:21197} ?Adequate lighting in your home to reduce risk of falls? {YES/NO:21197} ? ?ASSISTIVE D

## 2021-06-14 ENCOUNTER — Other Ambulatory Visit: Payer: Self-pay | Admitting: Family Medicine

## 2021-06-15 NOTE — Telephone Encounter (Signed)
Requested medication (s) are due for refill today:yes  Requested medication (s) are on the active medication list: yes    Last refill: 06/04/20 1ml  3 refills  Future visit scheduled no  Notes to clinic:Pt was no show for appt 02/11/21. Please review  Requested Prescriptions  Pending Prescriptions Disp Refills   HUMALOG KWIKPEN 100 UNIT/ML KwikPen [Pharmacy Med Name: HumaLOG KwikPen 100 UNIT/ML Subcutaneous Solution Pen-injector] 30 mL 2    Sig: INJECT SUBCUTANEOUSLY 7  UNITS 3 TIMES DAILY     Endocrinology:  Diabetes - Insulins Failed - 06/14/2021 11:30 PM      Failed - HBA1C is between 0 and 7.9 and within 180 days    Hgb A1C (fingerstick)  Date Value Ref Range Status  05/10/2017 6.9 (H) <6.0 % OF TOTAL HGB Final   Hgb A1c MFr Bld  Date Value Ref Range Status  08/24/2020 7.2 (H) <5.7 % of total Hgb Final    Comment:    For someone without known diabetes, a hemoglobin A1c value of 6.5% or greater indicates that they may have  diabetes and this should be confirmed with a follow-up  test. . For someone with known diabetes, a value <7% indicates  that their diabetes is well controlled and a value  greater than or equal to 7% indicates suboptimal  control. A1c targets should be individualized based on  duration of diabetes, age, comorbid conditions, and  other considerations. . Currently, no consensus exists regarding use of hemoglobin A1c for diagnosis of diabetes for children. .          Failed - Valid encounter within last 6 months    Recent Outpatient Visits           6 months ago Shaking chills   [place] Susy Frizzle, MD   9 months ago Chronic diastolic congestive heart failure ([place])   [place] Susy Frizzle, MD   9 months ago Diabetes mellitus with stage 4 chronic kidney disease GFR 15-29 ([place])   [place] Susy Frizzle, MD   10 months ago COPD exacerbation [facility])   [place], Jessica A, NP   11 months ago COPD exacerbation [facility])   [facility] Eulogio Bear, NP

## 2021-06-16 NOTE — Telephone Encounter (Signed)
Please advice re refill?  Try call pt to make an OV appt for med review, however, pt was in the ED on 5/18, not sure if pt has been D/C.

## 2021-07-09 ENCOUNTER — Other Ambulatory Visit: Payer: Self-pay | Admitting: Family Medicine

## 2021-07-11 ENCOUNTER — Encounter: Payer: Self-pay | Admitting: *Deleted

## 2021-07-11 ENCOUNTER — Other Ambulatory Visit: Payer: Self-pay | Admitting: Family Medicine

## 2021-07-11 DIAGNOSIS — E1142 Type 2 diabetes mellitus with diabetic polyneuropathy: Secondary | ICD-10-CM

## 2021-07-11 NOTE — Telephone Encounter (Signed)
Unable to reach pt or leave message at either number.

## 2021-07-11 NOTE — Telephone Encounter (Signed)
Requested Prescriptions  Pending Prescriptions Disp Refills  . gabapentin (NEURONTIN) 100 MG capsule [Pharmacy Med Name: Gabapentin 100 MG Oral Capsule] 200 capsule 0    Sig: TAKE 1 TO 2 CAPSULES BY  MOUTH AT BEDTIME FOR  PERIPHERAL NEUROPATHY     Neurology: Anticonvulsants - gabapentin Failed - 07/11/2021  4:14 PM      Failed - Cr in normal range and within 360 days    Creat  Date Value Ref Range Status  09/14/2020 1.78 (H) 0.60 - 0.95 mg/dL Final         Failed - Completed PHQ-2 or PHQ-9 in the last 360 days      Passed - Valid encounter within last 12 months    Recent Outpatient Visits          7 months ago Shaking chills   [place], Warren T, MD   10 months ago Chronic diastolic congestive heart failure ([place])   [place] Susy Frizzle, MD   10 months ago Diabetes mellitus with stage 4 chronic kidney disease GFR 15-29 ([place])   [place] Susy Frizzle, MD   11 months ago COPD exacerbation [facility])   [place], Jessica A, NP   11 months ago COPD exacerbation [facility])   [place] Eulogio Bear, NP

## 2021-07-11 NOTE — Telephone Encounter (Signed)
Requested Prescriptions  Pending Prescriptions Disp Refills  . carvedilol (COREG) 12.5 MG tablet [Pharmacy Med Name: Carvedilol 12.5 MG Oral Tablet] 200 tablet 0    Sig: TAKE 1 TABLET BY MOUTH  TWICE DAILY     Cardiovascular: Beta Blockers 3 Failed - 07/11/2021  8:18 AM      Failed - Cr in normal range and within 360 days    Creat  Date Value Ref Range Status  09/14/2020 1.78 (H) 0.60 - 0.95 mg/dL Final         Failed - AST in normal range and within 360 days    AST  Date Value Ref Range Status  08/24/2020 9 (L) 10 - 35 U/L Final         Failed - ALT in normal range and within 360 days    ALT  Date Value Ref Range Status  08/24/2020 5 (L) 6 - 29 U/L Final         Failed - Valid encounter within last 6 months    Recent Outpatient Visits          7 months ago Shaking chills   [place] Susy Frizzle, MD   10 months ago Chronic diastolic congestive heart failure ([place])   [place] Pickard, Cammie Mcgee, MD   10 months ago Diabetes mellitus with stage 4 chronic kidney disease GFR 15-29 ([place])   [place] Susy Frizzle, MD   11 months ago COPD exacerbation [facility])   [place] Eulogio Bear, NP   11 months ago COPD exacerbation [facility])   [place], Jessica A, NP             Passed - Last BP in normal range    BP Readings from Last 1 Encounters:  11/26/20 (!) 110/58         Passed - Last Heart Rate in normal range    Pulse Readings from Last 1 Encounters:  11/26/20 64         . XARELTO 15 MG TABS tablet [Pharmacy Med Name: Xarelto 15 MG Oral Tablet] 100 tablet 0    Sig: TAKE 1 TABLET BY MOUTH  DAILY     Hematology: Anticoagulants - rivaroxaban Failed - 07/11/2021  8:18 AM      Failed - ALT in normal range and within 360 days    ALT  Date Value Ref Range Status  08/24/2020 5 (L) 6 - 29 U/L Final         Failed - AST in normal range and within 360 days     AST  Date Value Ref Range Status  08/24/2020 9 (L) 10 - 35 U/L Final         Failed - Cr in normal range and within 360 days    Creat  Date Value Ref Range Status  09/14/2020 1.78 (H) 0.60 - 0.95 mg/dL Final         Failed - HCT in normal range and within 360 days    HCT  Date Value Ref Range Status  08/24/2020 33.4 (L) 35.0 - 45.0 % Final         Failed - HGB in normal range and within 360 days    Hemoglobin  Date Value Ref Range Status  08/24/2020 10.6 (L) 11.7 - 15.5 g/dL Final         Passed - PLT in normal range and within 360 days  Platelets  Date Value Ref Range Status  08/24/2020 276 140 - 400 Thousand/uL Final         Passed - eGFR is 15 or above and within 360 days    GFR, Est African American  Date Value Ref Range Status  07/23/2020 19 (L) > OR = 60 mL/min/1.91m2 Final   GFR, Est Non African American  Date Value Ref Range Status  07/23/2020 17 (L) > OR = 60 mL/min/1.105m2 Final   eGFR  Date Value Ref Range Status  09/14/2020 29 (L) > OR = 60 mL/min/1.45m2 Final    Comment:    The eGFR is based on the CKD-EPI 2021 equation. To calculate  the new eGFR from a previous Creatinine or Cystatin C result, go to https://www.kidney.org/professionals/ kdoqi/gfr%5Fcalculator          Passed - Patient is not pregnant      Passed - Valid encounter within last 12 months    Recent Outpatient Visits          7 months ago Shaking chills   [place], Warren T, MD   10 months ago Chronic diastolic congestive heart failure ([place])   [place] Susy Frizzle, MD   10 months ago Diabetes mellitus with stage 4 chronic kidney disease GFR 15-29 ([place])   [place] Susy Frizzle, MD   11 months ago COPD exacerbation [facility])   [facility] Eulogio Bear, NP   11 months ago COPD exacerbation [facility])   [facility] Eulogio Bear, NP

## 2021-07-11 NOTE — Telephone Encounter (Signed)
This encounter was created in error - please disregard.  This encounter was created in error - please disregard.

## 2021-07-17 ENCOUNTER — Other Ambulatory Visit: Payer: Self-pay | Admitting: Family Medicine

## 2021-08-07 ENCOUNTER — Other Ambulatory Visit: Payer: Self-pay | Admitting: Family Medicine

## 2021-08-09 NOTE — Telephone Encounter (Signed)
Requested Prescriptions  Pending Prescriptions Disp Refills  . febuxostat (ULORIC) 40 MG tablet [Pharmacy Med Name: FEBUXOSTAT  40MG   TAB] 100 tablet 0    Sig: TAKE 1 TABLET BY MOUTH  DAILY     Endocrinology: Gout Agents - febuxostat Failed - 08/07/2021 10:05 PM      Failed - Uric Acid in normal range and within 360 days    Uric Acid, Serum  Date Value Ref Range Status  06/21/2017 10.6 (H) 2.5 - 7.0 mg/dL Final    Comment:    Therapeutic target for gout patients: <6.0 mg/dL .          Failed - Cr in normal range and within 360 days    Creat  Date Value Ref Range Status  09/14/2020 1.78 (H) 0.60 - 0.95 mg/dL Final         Failed - AST in normal range and within 360 days    AST  Date Value Ref Range Status  08/24/2020 9 (L) 10 - 35 U/L Final         Failed - ALT in normal range and within 360 days    ALT  Date Value Ref Range Status  08/24/2020 5 (L) 6 - 29 U/L Final         Passed - Valid encounter within last 12 months    Recent Outpatient Visits          8 months ago Shaking chills   [place] Pickard, Cammie Mcgee, MD   10 months ago Chronic diastolic congestive heart failure ([place])   [place] Susy Frizzle, MD   11 months ago Diabetes mellitus with stage 4 chronic kidney disease GFR 15-29 ([place])   [place] Pickard, Cammie Mcgee, MD   1 year ago COPD exacerbation ([place])   [place] Eulogio Bear, NP   1 year ago COPD exacerbation ([place])   [place] Noemi [place] A, NP             . omeprazole (PRILOSEC) 20 MG capsule [Pharmacy Med Name: Omeprazole 20 MG Oral Capsule Delayed Release] 100 capsule 0    Sig: TAKE 1 CAPSULE BY MOUTH  DAILY     Gastroenterology: Proton Pump Inhibitors Passed - 08/07/2021 10:05 PM      Passed - Valid encounter within last 12 months    Recent Outpatient Visits          8 months ago Shaking chills   [place]  Susy Frizzle, MD   10 months ago Chronic diastolic congestive heart failure ([place])   [place] Pickard, Cammie Mcgee, MD   11 months ago Diabetes mellitus with stage 4 chronic kidney disease GFR 15-29 ([place])   [place] Pickard, Cammie Mcgee, MD   1 year ago COPD exacerbation ([place])   [place] Eulogio Bear, NP   1 year ago COPD exacerbation ([place])   [place], Jessica A, NP             . simvastatin (ZOCOR) 20 MG tablet [Pharmacy Med Name: Simvastatin 20 MG Oral Tablet] 100 tablet 0    Sig: TAKE 1 TABLET BY MOUTH ONCE DAILY AT BEDTIME     Cardiovascular:  Antilipid - Statins Failed - 08/07/2021 10:05 PM      Failed - Lipid Panel in normal range within the last 12 months  Cholesterol  Date Value Ref Range Status  01/06/2020 123 <200 mg/dL Final   LDL Cholesterol (Calc)  Date Value Ref Range Status  01/06/2020 52 mg/dL (calc) Final    Comment:    Reference range: <100 . Desirable range <100 mg/dL for primary prevention;   <70 mg/dL for patients with CHD or diabetic patients  with > or = 2 CHD risk factors. [place] Kitchen LDL-C is now calculated using the Martin-Hopkins  calculation, which is a validated novel method providing  better accuracy than the Friedewald equation in the  estimation of LDL-C.  Cresenciano Genre et al. Annamaria Helling. 3832;919(16): 2061-2068  (http://education.[facility].com/faq/FAQ164)    HDL  Date Value Ref Range Status  01/06/2020 53 > OR = 50 mg/dL Final   Triglycerides  Date Value Ref Range Status  01/06/2020 99 <150 mg/dL Final         Passed - Patient is not pregnant      Passed - Valid encounter within last 12 months    Recent Outpatient Visits          8 months ago Shaking chills   [place] Susy Frizzle, MD   10 months ago Chronic diastolic congestive heart failure ([place])   [place] Susy Frizzle, MD   11 months ago  Diabetes mellitus with stage 4 chronic kidney disease GFR 15-29 ([place])   [place] Pickard, Cammie Mcgee, MD   1 year ago COPD exacerbation [facility])   [facility] Medicine Eulogio Bear, NP   1 year ago COPD exacerbation [facility])   [facility] Medicine Eulogio Bear, NP

## 2021-09-14 ENCOUNTER — Other Ambulatory Visit: Payer: Self-pay | Admitting: Family Medicine

## 2021-09-15 NOTE — Telephone Encounter (Signed)
Requested medication (s) are due for refill today: Yes  Requested medication (s) are on the active medication list: Yes  Last refill:    Future visit scheduled: No  Notes to clinic:  Pt. States she has moved to [facility] and has a new doctor.    Requested Prescriptions  Pending Prescriptions Disp Refills   XARELTO 15 MG TABS tablet [Pharmacy Med Name: Xarelto 15 MG Oral Tablet] 100 tablet 2    Sig: TAKE 1 TABLET BY MOUTH  DAILY     Hematology: Anticoagulants - rivaroxaban Failed - 09/14/2021 10:05 PM      Failed - ALT in normal range and within 360 days    ALT  Date Value Ref Range Status  08/24/2020 5 (L) 6 - 29 U/L Final         Failed - AST in normal range and within 360 days    AST  Date Value Ref Range Status  08/24/2020 9 (L) 10 - 35 U/L Final         Failed - Cr in normal range and within 360 days    Creat  Date Value Ref Range Status  09/14/2020 1.78 (H) 0.60 - 0.95 mg/dL Final         Failed - HCT in normal range and within 360 days    HCT  Date Value Ref Range Status  08/24/2020 33.4 (L) 35.0 - 45.0 % Final         Failed - HGB in normal range and within 360 days    Hemoglobin  Date Value Ref Range Status  08/24/2020 10.6 (L) 11.7 - 15.5 g/dL Final         Failed - PLT in normal range and within 360 days    Platelets  Date Value Ref Range Status  08/24/2020 276 140 - 400 Thousand/uL Final         Failed - eGFR is 15 or above and within 360 days    GFR, Est African American  Date Value Ref Range Status  07/23/2020 19 (L) > OR = 60 mL/min/1.71m2 Final   GFR, Est Non African American  Date Value Ref Range Status  07/23/2020 17 (L) > OR = 60 mL/min/1.22m2 Final   eGFR  Date Value Ref Range Status  09/14/2020 29 (L) > OR = 60 mL/min/1.30m2 Final    Comment:    The eGFR is based on the CKD-EPI 2021 equation. To calculate  the new eGFR from a previous Creatinine or Cystatin C result, go to  https://www.kidney.org/professionals/ kdoqi/gfr%5Fcalculator          Failed - Valid encounter within last 12 months    Recent Outpatient Visits           9 months ago Shaking chills   [place]enn[place] Schaumann, Cammie Mcgee, MD   1 year ago Chronic diastolic congestive heart failure ([place])   [place] Susy Frizzle, MD   1 year ago Diabetes mellitus with stage 4 chronic kidney disease GFR 15-29 ([place])   [place] Pickard, Cammie Mcgee, MD   1 year ago COPD exacerbation [facility])   [facility] Eulogio Bear, NP   1 year ago COPD exacerbation [facility])   [place], [place], NP              Passed - Patient is not pregnant       carvedilol (COREG) 12.5 MG tablet [Pharmacy Med  Name: Carvedilol 12.5 MG Oral Tablet] 200 tablet 2    Sig: TAKE 1 TABLET BY MOUTH  TWICE DAILY     Cardiovascular: Beta Blockers 3 Failed - 09/14/2021 10:05 PM      Failed - Cr in normal range and within 360 days    Creat  Date Value Ref Range Status  09/14/2020 1.78 (H) 0.60 - 0.95 mg/dL Final         Failed - AST in normal range and within 360 days    AST  Date Value Ref Range Status  08/24/2020 9 (L) 10 - 35 U/L Final         Failed - ALT in normal range and within 360 days    ALT  Date Value Ref Range Status  08/24/2020 5 (L) 6 - 29 U/L Final         Failed - Valid encounter within last 6 months    Recent Outpatient Visits           9 months ago Shaking chills   [place] Susy Frizzle, MD   1 year ago Chronic diastolic congestive heart failure ([place])   [place] Susy Frizzle, MD   1 year ago Diabetes mellitus with stage 4 chronic kidney disease GFR 15-29 ([place])   [place] Pickard, Cammie Mcgee, MD   1 year ago COPD exacerbation [facility])   [place] [facility] Eulogio Bear, NP   1 year ago COPD exacerbation [facility])   [place] Noemi Chapel A, NP              Passed - Last BP in normal range    BP Readings from Last 1 Encounters:  11/26/20 (!) 110/58         Passed - Last Heart Rate in normal range    Pulse Readings from Last 1 Encounters:  11/26/20 64

## 2021-09-16 NOTE — Telephone Encounter (Signed)
Please advice see pharmacy notes:  Use ACTUAL BODY WEIGHT to determine CrCl for rivaroxaban dosing. Consider alternative anticoagulant if CrCl < 15 mL/min.  For Atrial Fib indication, reduce dose for CrCl  15 - 50 mL/min.  May administer via G-Tubes.  Do NOT give via J-Tube due to significant decrease in absorption.

## 2021-09-28 ENCOUNTER — Other Ambulatory Visit: Payer: Self-pay | Admitting: Family Medicine

## 2021-10-04 ENCOUNTER — Other Ambulatory Visit: Payer: Self-pay | Admitting: Family Medicine

## 2021-10-04 DIAGNOSIS — E1142 Type 2 diabetes mellitus with diabetic polyneuropathy: Secondary | ICD-10-CM

## 2021-10-04 DIAGNOSIS — E1165 Type 2 diabetes mellitus with hyperglycemia: Secondary | ICD-10-CM

## 2021-10-04 DIAGNOSIS — E1121 Type 2 diabetes mellitus with diabetic nephropathy: Secondary | ICD-10-CM

## 2021-10-06 NOTE — Telephone Encounter (Signed)
Called patient to schedule appt for medication refills. Patient reports she now lives with son in another county. She has another proviider. Patient is not out of medications. Requested Prescriptions  Refused Prescriptions Disp Refills  . gabapentin (NEURONTIN) 100 MG capsule [Pharmacy Med Name: Gabapentin 100 MG Oral Capsule] 200 capsule 2    Sig: TAKE 1 TO 2 CAPSULES BY MOUTH AT BEDTIME FOR PERIPHERAL  NEUROPATHY     Neurology: Anticonvulsants - gabapentin Failed - 10/04/2021 10:08 PM      Failed - Cr in normal range and within 360 days    Creat  Date Value Ref Range Status  09/14/2020 1.78 (H) 0.60 - 0.95 mg/dL Final         Failed - Completed PHQ-2 or PHQ-9 in the last 360 days      Failed - Valid encounter within last 12 months    Recent Outpatient Visits          10 months ago Shaking chills   [place] Susy Frizzle, MD   1 year ago Chronic diastolic congestive heart failure ([place])   [place] Susy Frizzle, MD   1 year ago Diabetes mellitus with stage 4 chronic kidney disease GFR 15-29 ([place])   [place] Pickard, Cammie Mcgee, MD   1 year ago COPD exacerbation [facility])   [place], Jessica A, NP   1 year ago COPD exacerbation [facility])   [place], Jessica A, NP             . Accu-Chek Softclix Lancets lancets [Pharmacy Med Name: Accu-Chek Softclix Lancets] 400 each 2    Sig: USE TO CHECK BLOOD SUGAR 3  TIMES DAILY BEFORE MEALS  AND AT BEDTIME     Endocrinology: Diabetes - Testing Supplies Failed - 10/04/2021 10:08 PM      Failed - Valid encounter within last 12 months    Recent Outpatient Visits          10 months ago Shaking chills   [place] Susy Frizzle, MD   1 year ago Chronic diastolic congestive heart failure ([place])   [place] Susy Frizzle, MD   1 year ago Diabetes mellitus with stage 4 chronic kidney  disease GFR 15-29 ([place])   [place] Pickard, Cammie Mcgee, MD   1 year ago COPD exacerbation [facility])   [facility] Eulogio Bear, NP   1 year ago COPD exacerbation [facility])   [facility] Medicine Eulogio Bear, NP

## 2021-10-06 NOTE — Telephone Encounter (Signed)
Contacted patient to review medication refill requests. Patient reports she now lives with son in [place] county and has another provider there.

## 2021-10-06 NOTE — Telephone Encounter (Signed)
Pt needs appt. Attempted to reach pt, extended ring.

## 2021-10-07 ENCOUNTER — Telehealth: Payer: Self-pay | Admitting: Family Medicine

## 2021-10-07 NOTE — Telephone Encounter (Signed)
Outbound call placed to patient to schedule Medicare AWV. Patient stated she moved to [facility] [place] to live with family and won't be returning to this office for medical care; patient stated she already has a new provider. O advised patient to complete a record release form at the new office and have them send it to [place] to have her medical records transferred.  Also spoke with patient's daughter as patient requested by patient to give her the same information; daughter expressed understanding. Nothing further needed at this time.

## 2021-11-10 ENCOUNTER — Other Ambulatory Visit: Payer: Self-pay | Admitting: Family Medicine

## 2021-11-11 NOTE — Telephone Encounter (Signed)
Requested medication (s) are due for refill today: yes  Requested medication (s) are on the active medication list: yes  Last refill:  08/09/21 #100 with 0 RF for both Uloric and Simvastatin  Future visit scheduled: no  Notes to clinic:  Has already had a curtesy refill and there is no upcoming appointment scheduled. Labs are from 2019, 2021 and 08/2020, please assess.       Requested Prescriptions  Pending Prescriptions Disp Refills   simvastatin (ZOCOR) 20 MG tablet [Pharmacy Med Name: Simvastatin 20 MG Oral Tablet] 100 tablet 2    Sig: TAKE 1 TABLET BY MOUTH ONCE DAILY AT BEDTIME     Cardiovascular:  Antilipid - Statins Failed - 11/10/2021 10:32 PM      Failed - Valid encounter within last 12 months    Recent Outpatient Visits           11 months ago Shaking chills   [place] Susy Frizzle, MD   1 year ago Chronic diastolic congestive heart failure ([place])   [place] Susy Frizzle, MD   1 year ago Diabetes mellitus with stage 4 chronic kidney disease GFR 15-29 ([place])   [place] Pickard, Cammie Mcgee, MD   1 year ago COPD exacerbation [facility])   [place] Medicine Eulogio Bear, NP   1 year ago COPD exacerbation ([place])   [place], Jessica A, NP              Failed - Lipid Panel in normal range within the last 12 months    Cholesterol  Date Value Ref Range Status  01/06/2020 123 <200 mg/dL Final   LDL Cholesterol (Calc)  Date Value Ref Range Status  01/06/2020 52 mg/dL (calc) Final    Comment:    Reference range: <100 . Desirable range <100 mg/dL for primary prevention;   <70 mg/dL for patients with CHD or diabetic patients  with > or = 2 CHD risk factors. Marland Kitchen LDL-C is now calculated using the Martin-Hopkins  calculation, which is a validated novel method providing  better accuracy than the Friedewald equation in the  estimation of LDL-C.  Cresenciano Genre et al.  Annamaria Helling. 4944;967(59): 2061-2068  (http://education.[facility].com/faq/FAQ164)    HDL  Date Value Ref Range Status  01/06/2020 53 > OR = 50 mg/dL Final   Triglycerides  Date Value Ref Range Status  01/06/2020 99 <150 mg/dL Final         Passed - Patient is not pregnant       febuxostat (ULORIC) 40 MG tablet [Pharmacy Med Name: FEBUXOSTAT  40MG   TAB] 100 tablet 2    Sig: TAKE 1 TABLET BY MOUTH  DAILY     Endocrinology: Gout Agents - febuxostat Failed - 11/10/2021 10:32 PM      Failed - Uric Acid in normal range and within 360 days    Uric Acid, Serum  Date Value Ref Range Status  06/21/2017 10.6 (H) 2.5 - 7.0 mg/dL Final    Comment:    Therapeutic target for gout patients: <6.0 mg/dL .          Failed - Cr in normal range and within 360 days    Creat  Date Value Ref Range Status  09/14/2020 1.78 (H) 0.60 - 0.95 mg/dL Final         Failed - AST in normal range and within 360 days    AST  Date Value Ref Range  Status  08/24/2020 9 (L) 10 - 35 U/L Final         Failed - ALT in normal range and within 360 days    ALT  Date Value Ref Range Status  08/24/2020 5 (L) 6 - 29 U/L Final         Failed - Valid encounter within last 12 months    Recent Outpatient Visits           11 months ago Shaking chills   [place] Susy Frizzle, MD   1 year ago Chronic diastolic congestive heart failure ([place])   [place] Susy Frizzle, MD   1 year ago Diabetes mellitus with stage 4 chronic kidney disease GFR 15-29 ([place])   [place] Pickard, Cammie Mcgee, MD   1 year ago COPD exacerbation [facility])   [facility] Medicine Eulogio Bear, NP   1 year ago COPD exacerbation [facility])   [place] Medicine Eulogio Bear, NP

## 2021-11-14 ENCOUNTER — Telehealth: Payer: Self-pay | Admitting: Pharmacist

## 2021-11-14 NOTE — Progress Notes (Signed)
error 

## 2021-12-31 ENCOUNTER — Other Ambulatory Visit: Payer: Self-pay | Admitting: Family Medicine

## 2022-01-23 ENCOUNTER — Other Ambulatory Visit: Payer: Self-pay | Admitting: Family Medicine

## 2022-02-17 ENCOUNTER — Telehealth: Payer: Self-pay | Admitting: Family Medicine

## 2022-02-17 NOTE — Telephone Encounter (Signed)
Received call from [place] with [employer] to report patient's GFR level is 26; recommending for patient to see a nephrologist.  Please advise [place] with any questions at [phone], ext [phone].

## 2022-04-20 ENCOUNTER — Other Ambulatory Visit: Payer: Self-pay | Admitting: Family Medicine

## 2022-04-21 NOTE — Telephone Encounter (Signed)
Unable to refill per protocol, appointment needed. Called patient to schedule OV, no answer or VM. After review of patient's chart, looks like they see another PCP.  Requested Prescriptions  Pending Prescriptions Disp Refills   furosemide (LASIX) 40 MG tablet [Pharmacy Med Name: Furosemide 40 MG Oral Tablet] 150 tablet 2    Sig: TAKE 1 AND 1/2 TABLETS BY MOUTH  DAILY     Cardiovascular:  Diuretics - Loop Failed - 04/20/2022 10:04 PM      Failed - K in normal range and within 180 days    Potassium  Date Value Ref Range Status  09/14/2020 4.1 3.5 - 5.3 mmol/L Final         Failed - Ca in normal range and within 180 days    Calcium  Date Value Ref Range Status  09/14/2020 9.0 8.6 - 10.4 mg/dL Final   Calcium, Ion  Date Value Ref Range Status  05/09/2017 1.12 (L) 1.15 - 1.40 mmol/L Final         Failed - Na in normal range and within 180 days    Sodium  Date Value Ref Range Status  09/14/2020 144 135 - 146 mmol/L Final  02/09/2017 142 134 - 144 mmol/L Final         Failed - Cr in normal range and within 180 days    Creat  Date Value Ref Range Status  09/14/2020 1.78 (H) 0.60 - 0.95 mg/dL Final         Failed - Cl in normal range and within 180 days    Chloride  Date Value Ref Range Status  09/14/2020 106 98 - 110 mmol/L Final         Failed - Mg Level in normal range and within 180 days    Magnesium  Date Value Ref Range Status  10/23/2017 2.2 1.7 - 2.4 mg/dL Final    Comment:    Performed at [facility], [address], [place], [place] [address]         Failed - Valid encounter within last 6 months    Recent Outpatient Visits           1 year ago Shaking chills   [place] Susy Frizzle, MD   1 year ago Chronic diastolic congestive heart failure ([place])   [place] Susy Frizzle, MD   1 year ago Diabetes mellitus with stage 4 chronic kidney disease GFR 15-29 ([place])   [place] Susy Frizzle, MD    1 year ago COPD exacerbation [facility])   [place], Jessica A, NP   1 year ago COPD exacerbation [facility])   [place], Jessica A, NP              Passed - Last BP in normal range    BP Readings from Last 1 Encounters:  11/26/20 (!) 110/58

## 2022-05-10 ENCOUNTER — Other Ambulatory Visit: Payer: Self-pay | Admitting: Family Medicine

## 2022-07-29 ENCOUNTER — Other Ambulatory Visit: Payer: Self-pay | Admitting: Family Medicine

## 2022-07-31 NOTE — Telephone Encounter (Signed)
Requested Prescriptions  Refused Prescriptions Disp Refills   omeprazole (PRILOSEC) 20 MG capsule [Pharmacy Med Name: Omeprazole 20 MG Oral Capsule Delayed Release] 100 capsule 2    Sig: TAKE 1 CAPSULE BY MOUTH DAILY     Gastroenterology: Proton Pump Inhibitors Failed - 07/29/2022  9:17 AM      Failed - Valid encounter within last 12 months    Recent Outpatient Visits           1 year ago Shaking chills   [facility] Pickard, Priscille Heidelberg, MD   1 year ago Chronic diastolic congestive heart failure (HCC)   [facility] Donita Brooks, MD   1 year ago Diabetes mellitus with stage 4 chronic kidney disease GFR 15-29 (HCC)   [facility] Donita Brooks, MD   2 years ago COPD exacerbation (HCC)   [facility] Valentino Nose, NP   2 years ago COPD exacerbation (HCC)   [facility] Valentino Nose, NP               furosemide (LASIX) 40 MG tablet [Pharmacy Med Name: Furosemide 40 MG Oral Tablet] 150 tablet 2    Sig: TAKE 1 AND 1/2 TABLETS BY MOUTH  DAILY     Cardiovascular:  Diuretics - Loop Failed - 07/29/2022  9:17 AM      Failed - K in normal range and within 180 days    Potassium  Date Value Ref Range Status  09/14/2020 4.1 3.5 - 5.3 mmol/L Final         Failed - Ca in normal range and within 180 days    Calcium  Date Value Ref Range Status  09/14/2020 9.0 8.6 - 10.4 mg/dL Final   Calcium, Ion  Date Value Ref Range Status  05/09/2017 1.12 (L) 1.15 - 1.40 mmol/L Final         Failed - Na in normal range and within 180 days    Sodium  Date Value Ref Range Status  09/14/2020 144 135 - 146 mmol/L Final  02/09/2017 142 134 - 144 mmol/L Final         Failed - Cr in normal range and within 180 days    Creat  Date Value Ref Range Status  09/14/2020 1.78 (H) 0.60 - 0.95 mg/dL Final         Failed - Cl in normal range and within 180 days    Chloride  Date Value Ref Range Status   09/14/2020 106 98 - 110 mmol/L Final         Failed - Mg Level in normal range and within 180 days    Magnesium  Date Value Ref Range Status  10/23/2017 2.2 1.7 - 2.4 mg/dL Final    Comment:    Performed at [facility], [address], [place], [place] [address]         Failed - Valid encounter within last 6 months    Recent Outpatient Visits           1 year ago Shaking chills   [facility] Donita Brooks, MD   1 year ago Chronic diastolic congestive heart failure (HCC)   [facility] Donita Brooks, MD   1 year ago Diabetes mellitus with stage 4 chronic kidney disease GFR 15-29 (HCC)   [facility] Donita Brooks, MD   2 years ago COPD exacerbation (HCC)  [facility] Valentino Nose, NP   2 years ago COPD exacerbation (HCC)   [facility] Medicine Cathlean Marseilles A, NP              Passed - Last BP in normal range    BP Readings from Last 1 Encounters:  11/26/20 (!) 110/58          simvastatin (ZOCOR) 20 MG tablet [Pharmacy Med Name: Simvastatin 20 MG Oral Tablet] 100 tablet 2    Sig: TAKE 1 TABLET BY MOUTH ONCE  DAILY AT BEDTIME     Cardiovascular:  Antilipid - Statins Failed - 07/29/2022  9:17 AM      Failed - Valid encounter within last 12 months    Recent Outpatient Visits           1 year ago Shaking chills   [facility] Donita Brooks, MD   1 year ago Chronic diastolic congestive heart failure (HCC)   [facility] Donita Brooks, MD   1 year ago Diabetes mellitus with stage 4 chronic kidney disease GFR 15-29 (HCC)   [facility] Donita Brooks, MD   2 years ago COPD exacerbation (HCC)   [facility] Valentino Nose, NP   2 years ago COPD exacerbation (HCC)   [facility] Medicine Cathlean Marseilles A, NP              Failed - Lipid Panel in normal range within the last 12  months    Cholesterol  Date Value Ref Range Status  01/06/2020 123 <200 mg/dL Final   LDL Cholesterol (Calc)  Date Value Ref Range Status  01/06/2020 52 mg/dL (calc) Final    Comment:    Reference range: <100 . Desirable range <100 mg/dL for primary prevention;   <70 mg/dL for patients with CHD or diabetic patients  with > or = 2 CHD risk factors. Marland Kitchen LDL-C is now calculated using the Martin-Hopkins  calculation, which is a validated novel method providing  better accuracy than the Friedewald equation in the  estimation of LDL-C.  Horald Pollen et al. Lenox Ahr. 9604;540(98): 2061-2068  (http://education.[employer].com/faq/FAQ164)    HDL  Date Value Ref Range Status  01/06/2020 53 > OR = 50 mg/dL Final   Triglycerides  Date Value Ref Range Status  01/06/2020 99 <150 mg/dL Final         Passed - Patient is not pregnant       febuxostat (ULORIC) 40 MG tablet [Pharmacy Med Name: FEBUXOSTAT  40MG   TAB] 100 tablet 2    Sig: TAKE 1 TABLET BY MOUTH DAILY     Endocrinology: Gout Agents - febuxostat Failed - 07/29/2022  9:17 AM      Failed - Uric Acid in normal range and within 360 days    Uric Acid, Serum  Date Value Ref Range Status  06/21/2017 10.6 (H) 2.5 - 7.0 mg/dL Final    Comment:    Therapeutic target for gout patients: <6.0 mg/dL .          Failed - Cr in normal range and within 360 days    Creat  Date Value Ref Range Status  09/14/2020 1.78 (H) 0.60 - 0.95 mg/dL Final         Failed - AST in normal range and within 360 days    AST  Date Value Ref Range Status  08/24/2020 9 (L) 10 - 35 U/L Final  Failed - ALT in normal range and within 360 days    ALT  Date Value Ref Range Status  08/24/2020 5 (L) 6 - 29 U/L Final         Failed - Valid encounter within last 12 months    Recent Outpatient Visits           1 year ago Shaking chills   [facility] Donita Brooks, MD   1 year ago Chronic diastolic congestive heart failure  (HCC)   [facility] Donita Brooks, MD   1 year ago Diabetes mellitus with stage 4 chronic kidney disease GFR 15-29 (HCC)   [facility] Medicine Donita Brooks, MD   2 years ago COPD exacerbation [facility])   [facility] Valentino Nose, NP   2 years ago COPD exacerbation (HCC)   [facility] Valentino Nose, NP

## 2022-08-29 ENCOUNTER — Other Ambulatory Visit: Payer: Self-pay | Admitting: Family Medicine

## 2022-08-29 NOTE — Telephone Encounter (Signed)
Requested medications are due for refill today.  yes  Requested medications are on the active medications list.  yes  Last refill. 09/19/2021 #100 2 rf  Future visit scheduled.   no  Notes to clinic.  Labs are expired. It appears that pt is going to a different provider.    Requested Prescriptions  Pending Prescriptions Disp Refills   XARELTO 15 MG TABS tablet [Pharmacy Med Name: Xarelto 15 MG Oral Tablet] 100 tablet 2    Sig: TAKE 1 TABLET BY MOUTH DAILY     Hematology: Anticoagulants - rivaroxaban Failed - 08/29/2022  5:17 AM      Failed - ALT in normal range and within 360 days    ALT  Date Value Ref Range Status  08/24/2020 5 (L) 6 - 29 U/L Final         Failed - AST in normal range and within 360 days    AST  Date Value Ref Range Status  08/24/2020 9 (L) 10 - 35 U/L Final         Failed - Cr in normal range and within 360 days    Creat  Date Value Ref Range Status  09/14/2020 1.78 (H) 0.60 - 0.95 mg/dL Final         Failed - HCT in normal range and within 360 days    HCT  Date Value Ref Range Status  08/24/2020 33.4 (L) 35.0 - 45.0 % Final         Failed - HGB in normal range and within 360 days    Hemoglobin  Date Value Ref Range Status  08/24/2020 10.6 (L) 11.7 - 15.5 g/dL Final         Failed - PLT in normal range and within 360 days    Platelets  Date Value Ref Range Status  08/24/2020 276 140 - 400 Thousand/uL Final         Failed - eGFR is 15 or above and within 360 days    GFR, Est African American  Date Value Ref Range Status  07/23/2020 19 (L) > OR = 60 mL/min/1.7m2 Final   GFR, Est Non African American  Date Value Ref Range Status  07/23/2020 17 (L) > OR = 60 mL/min/1.52m2 Final   eGFR  Date Value Ref Range Status  09/14/2020 29 (L) > OR = 60 mL/min/1.66m2 Final    Comment:    The eGFR is based on the CKD-EPI 2021 equation. To calculate  the new eGFR from a previous Creatinine or Cystatin C result, go to  https://www.kidney.org/professionals/ kdoqi/gfr%5Fcalculator          Failed - Valid encounter within last 12 months    Recent Outpatient Visits           1 year ago Shaking chills   [facility] Tanya Nones, Priscille Heidelberg, MD   1 year ago Chronic diastolic congestive heart failure (HCC)   [facility] Donita Brooks, MD   2 years ago Diabetes mellitus with stage 4 chronic kidney disease GFR 15-29 (HCC)   [facility] Medicine Donita Brooks, MD   2 years ago COPD exacerbation [facility])   [facility] Valentino Nose, NP   2 years ago COPD exacerbation [facility])   [facility] Valentino Nose, NP              Passed - Patient is not pregnant

## 2022-09-30 ENCOUNTER — Other Ambulatory Visit: Payer: Self-pay | Admitting: Family Medicine

## 2022-10-02 NOTE — Telephone Encounter (Signed)
Requested medication (s) are due for refill today: yes  Requested medication (s) are on the active medication list: yes  Last refill:  multiple dates  Future visit scheduled: no  Notes to clinic:  Unable to refill per protocol, courtesy refill already given, routing for provider approval.      Requested Prescriptions  Pending Prescriptions Disp Refills   omeprazole (PRILOSEC) 20 MG capsule [Pharmacy Med Name: Omeprazole 20 MG Oral Capsule Delayed Release] 100 capsule 2    Sig: TAKE 1 CAPSULE BY MOUTH  DAILY     Gastroenterology: Proton Pump Inhibitors Failed - 09/30/2022 10:27 AM      Failed - Valid encounter within last 12 months    Recent Outpatient Visits           1 year ago Shaking chills   [facility] Tanya Nones, Priscille Heidelberg, MD   2 years ago Chronic diastolic congestive heart failure (HCC)   [facility] Pickard, Priscille Heidelberg, MD   2 years ago Diabetes mellitus with stage 4 chronic kidney disease GFR 15-29 (HCC)   [facility] Donita Brooks, MD   2 years ago COPD exacerbation (HCC)   [facility] Valentino Nose, NP   2 years ago COPD exacerbation (HCC)   [facility] Valentino Nose, NP               furosemide (LASIX) 40 MG tablet [Pharmacy Med Name: Furosemide 40 MG Oral Tablet] 150 tablet 2    Sig: TAKE 1 AND 1/2 TABLETS BY  MOUTH DAILY     Cardiovascular:  Diuretics - Loop Failed - 09/30/2022 10:27 AM      Failed - K in normal range and within 180 days    Potassium  Date Value Ref Range Status  09/14/2020 4.1 3.5 - 5.3 mmol/L Final         Failed - Ca in normal range and within 180 days    Calcium  Date Value Ref Range Status  09/14/2020 9.0 8.6 - 10.4 mg/dL Final   Calcium, Ion  Date Value Ref Range Status  05/09/2017 1.12 (L) 1.15 - 1.40 mmol/L Final         Failed - Na in normal range and within 180 days    Sodium  Date Value Ref Range Status  09/14/2020 144  135 - 146 mmol/L Final  02/09/2017 142 134 - 144 mmol/L Final         Failed - Cr in normal range and within 180 days    Creat  Date Value Ref Range Status  09/14/2020 1.78 (H) 0.60 - 0.95 mg/dL Final         Failed - Cl in normal range and within 180 days    Chloride  Date Value Ref Range Status  09/14/2020 106 98 - 110 mmol/L Final         Failed - Mg Level in normal range and within 180 days    Magnesium  Date Value Ref Range Status  10/23/2017 2.2 1.7 - 2.4 mg/dL Final    Comment:    Performed at [facility], [address], [place], [place] [address]         Failed - Valid encounter within last 6 months    Recent Outpatient Visits           1 year ago Shaking chills   [facility] Pickard, Priscille Heidelberg, MD   2  years ago Chronic diastolic congestive heart failure (HCC)   [facility] Donita Brooks, MD   2 years ago Diabetes mellitus with stage 4 chronic kidney disease GFR 15-29 (HCC)   [facility] Donita Brooks, MD   2 years ago COPD exacerbation [facility]   [facility] Valentino Nose, NP   2 years ago COPD exacerbation [facility])   [facility] Medicine Cathlean Marseilles A, NP              Passed - Last BP in normal range    BP Readings from Last 1 Encounters:  11/26/20 (!) 110/58

## 2022-10-06 ENCOUNTER — Other Ambulatory Visit: Payer: Self-pay | Admitting: Family Medicine

## 2022-10-06 NOTE — Telephone Encounter (Signed)
Pt no longer under prescribers care  Requested Prescriptions  Refused Prescriptions Disp Refills   carvedilol (COREG) 12.5 MG tablet [Pharmacy Med Name: Carvedilol 12.5 MG Oral Tablet] 200 tablet 2    Sig: TAKE 1 TABLET BY MOUTH TWICE  DAILY     Cardiovascular: Beta Blockers 3 Failed - 10/06/2022  4:56 AM      Failed - Cr in normal range and within 360 days    Creat  Date Value Ref Range Status  09/14/2020 1.78 (H) 0.60 - 0.95 mg/dL Final         Failed - AST in normal range and within 360 days    AST  Date Value Ref Range Status  08/24/2020 9 (L) 10 - 35 U/L Final         Failed - ALT in normal range and within 360 days    ALT  Date Value Ref Range Status  08/24/2020 5 (L) 6 - 29 U/L Final         Failed - Valid encounter within last 6 months    Recent Outpatient Visits           1 year ago Shaking chills   [facility] Donita Brooks, MD   2 years ago Chronic diastolic congestive heart failure (HCC)   [facility] Donita Brooks, MD   2 years ago Diabetes mellitus with stage 4 chronic kidney disease GFR 15-29 (HCC)   [facility] Donita Brooks, MD   2 years ago COPD exacerbation [facility])   [facility] Valentino Nose, NP   2 years ago COPD exacerbation (HCC)   [facility] Medicine Cathlean Marseilles A, NP              Passed - Last BP in normal range    BP Readings from Last 1 Encounters:  11/26/20 (!) 110/58         Passed - Last Heart Rate in normal range    Pulse Readings from Last 1 Encounters:  11/26/20 64
